# Patient Record
Sex: Female | Born: 1980
Health system: Southern US, Community
[De-identification: ages and names within clinical notes are randomized; demographics above are authoritative.]

## PROBLEM LIST (undated history)

## (undated) DIAGNOSIS — H5210 Myopia, unspecified eye: Secondary | ICD-10-CM

## (undated) DIAGNOSIS — M109 Gout, unspecified: Secondary | ICD-10-CM

## (undated) DIAGNOSIS — I499 Cardiac arrhythmia, unspecified: Secondary | ICD-10-CM

## (undated) DIAGNOSIS — J189 Pneumonia, unspecified organism: Secondary | ICD-10-CM

## (undated) DIAGNOSIS — G629 Polyneuropathy, unspecified: Secondary | ICD-10-CM

## (undated) DIAGNOSIS — M1A09X0 Idiopathic chronic gout, multiple sites, without tophus (tophi): Secondary | ICD-10-CM

## (undated) DIAGNOSIS — F32A Depression, unspecified: Secondary | ICD-10-CM

## (undated) DIAGNOSIS — D75838 Other thrombocytosis: Secondary | ICD-10-CM

## (undated) DIAGNOSIS — L732 Hidradenitis suppurativa: Secondary | ICD-10-CM

## (undated) DIAGNOSIS — R51 Headache: Secondary | ICD-10-CM

## (undated) DIAGNOSIS — Z872 Personal history of diseases of the skin and subcutaneous tissue: Secondary | ICD-10-CM

## (undated) DIAGNOSIS — L0591 Pilonidal cyst without abscess: Secondary | ICD-10-CM

## (undated) DIAGNOSIS — M1990 Unspecified osteoarthritis, unspecified site: Secondary | ICD-10-CM

## (undated) DIAGNOSIS — R7303 Prediabetes: Secondary | ICD-10-CM

## (undated) DIAGNOSIS — R519 Headache, unspecified: Secondary | ICD-10-CM

## (undated) DIAGNOSIS — M25569 Pain in unspecified knee: Secondary | ICD-10-CM

## (undated) DIAGNOSIS — G8929 Other chronic pain: Secondary | ICD-10-CM

## (undated) DIAGNOSIS — E538 Deficiency of other specified B group vitamins: Secondary | ICD-10-CM

## (undated) DIAGNOSIS — D649 Anemia, unspecified: Secondary | ICD-10-CM

## (undated) DIAGNOSIS — Z973 Presence of spectacles and contact lenses: Secondary | ICD-10-CM

## (undated) DIAGNOSIS — D509 Iron deficiency anemia, unspecified: Secondary | ICD-10-CM

## (undated) DIAGNOSIS — F329 Major depressive disorder, single episode, unspecified: Secondary | ICD-10-CM

## (undated) DIAGNOSIS — F419 Anxiety disorder, unspecified: Secondary | ICD-10-CM

## (undated) DIAGNOSIS — J45909 Unspecified asthma, uncomplicated: Secondary | ICD-10-CM

## (undated) DIAGNOSIS — L0293 Carbuncle, unspecified: Secondary | ICD-10-CM

## (undated) DIAGNOSIS — E669 Obesity, unspecified: Secondary | ICD-10-CM

## (undated) DIAGNOSIS — B009 Herpesviral infection, unspecified: Secondary | ICD-10-CM

## (undated) DIAGNOSIS — T7840XA Allergy, unspecified, initial encounter: Secondary | ICD-10-CM

## (undated) DIAGNOSIS — I456 Pre-excitation syndrome: Secondary | ICD-10-CM

## (undated) HISTORY — DX: Anemia, unspecified: D64.9

## (undated) HISTORY — DX: Myopia, unspecified eye: H52.10

## (undated) HISTORY — DX: Allergy, unspecified, initial encounter: T78.40XA

## (undated) HISTORY — DX: Other chronic pain: G89.29

## (undated) HISTORY — DX: Headache, unspecified: R51.9

## (undated) HISTORY — PX: TONSILLECTOMY: SUR1361

## (undated) HISTORY — DX: Headache: R51

## (undated) HISTORY — DX: Carbuncle, unspecified: L02.93

---
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
## 2000-12-27 ENCOUNTER — Emergency Department (HOSPITAL_COMMUNITY): Admission: EM | Admit: 2000-12-27 | Discharge: 2000-12-27 | Payer: Self-pay | Admitting: Emergency Medicine

## 2001-01-03 ENCOUNTER — Ambulatory Visit (HOSPITAL_COMMUNITY): Admission: RE | Admit: 2001-01-03 | Discharge: 2001-01-03 | Payer: Self-pay | Admitting: Family Medicine

## 2001-01-03 ENCOUNTER — Encounter: Payer: Self-pay | Admitting: Family Medicine

## 2002-10-11 ENCOUNTER — Emergency Department (HOSPITAL_COMMUNITY): Admission: EM | Admit: 2002-10-11 | Discharge: 2002-10-11 | Payer: Self-pay | Admitting: Emergency Medicine

## 2003-02-07 ENCOUNTER — Emergency Department (HOSPITAL_COMMUNITY): Admission: EM | Admit: 2003-02-07 | Discharge: 2003-02-08 | Payer: Self-pay | Admitting: Emergency Medicine

## 2003-05-16 ENCOUNTER — Inpatient Hospital Stay (HOSPITAL_COMMUNITY): Admission: AD | Admit: 2003-05-16 | Discharge: 2003-05-16 | Payer: Self-pay | Admitting: Obstetrics and Gynecology

## 2003-05-18 ENCOUNTER — Inpatient Hospital Stay (HOSPITAL_COMMUNITY): Admission: AD | Admit: 2003-05-18 | Discharge: 2003-05-18 | Payer: Self-pay | Admitting: *Deleted

## 2003-10-21 ENCOUNTER — Emergency Department (HOSPITAL_COMMUNITY): Admission: EM | Admit: 2003-10-21 | Discharge: 2003-10-22 | Payer: Self-pay | Admitting: Emergency Medicine

## 2004-01-09 ENCOUNTER — Emergency Department (HOSPITAL_COMMUNITY): Admission: EM | Admit: 2004-01-09 | Discharge: 2004-01-10 | Payer: Self-pay | Admitting: Emergency Medicine

## 2004-01-12 ENCOUNTER — Emergency Department (HOSPITAL_COMMUNITY): Admission: EM | Admit: 2004-01-12 | Discharge: 2004-01-12 | Payer: Self-pay | Admitting: Emergency Medicine

## 2004-02-21 ENCOUNTER — Emergency Department (HOSPITAL_COMMUNITY): Admission: EM | Admit: 2004-02-21 | Discharge: 2004-02-21 | Payer: Self-pay | Admitting: Family Medicine

## 2004-04-06 ENCOUNTER — Encounter (INDEPENDENT_AMBULATORY_CARE_PROVIDER_SITE_OTHER): Payer: Self-pay | Admitting: Internal Medicine

## 2004-04-06 LAB — CONVERTED CEMR LAB

## 2004-06-18 ENCOUNTER — Emergency Department (HOSPITAL_COMMUNITY): Admission: EM | Admit: 2004-06-18 | Discharge: 2004-06-18 | Payer: Self-pay | Admitting: Emergency Medicine

## 2004-12-24 ENCOUNTER — Emergency Department (HOSPITAL_COMMUNITY): Admission: EM | Admit: 2004-12-24 | Discharge: 2004-12-25 | Payer: Self-pay | Admitting: Emergency Medicine

## 2004-12-27 ENCOUNTER — Inpatient Hospital Stay (HOSPITAL_COMMUNITY): Admission: EM | Admit: 2004-12-27 | Discharge: 2004-12-28 | Payer: Self-pay | Admitting: Emergency Medicine

## 2004-12-27 HISTORY — PX: INCISION AND DRAINAGE PERITONSILLAR ABSCESS: SUR670

## 2005-05-01 ENCOUNTER — Encounter: Admission: RE | Admit: 2005-05-01 | Discharge: 2005-05-01 | Payer: Self-pay | Admitting: Internal Medicine

## 2005-08-27 ENCOUNTER — Emergency Department (HOSPITAL_COMMUNITY): Admission: EM | Admit: 2005-08-27 | Discharge: 2005-08-28 | Payer: Self-pay | Admitting: Emergency Medicine

## 2005-09-17 ENCOUNTER — Emergency Department (HOSPITAL_COMMUNITY): Admission: EM | Admit: 2005-09-17 | Discharge: 2005-09-17 | Payer: Self-pay | Admitting: Emergency Medicine

## 2005-09-17 ENCOUNTER — Emergency Department (HOSPITAL_COMMUNITY): Admission: EM | Admit: 2005-09-17 | Discharge: 2005-09-17 | Payer: Self-pay | Admitting: Family Medicine

## 2006-03-14 ENCOUNTER — Emergency Department (HOSPITAL_COMMUNITY): Admission: EM | Admit: 2006-03-14 | Discharge: 2006-03-14 | Payer: Self-pay | Admitting: Emergency Medicine

## 2006-04-29 ENCOUNTER — Ambulatory Visit: Payer: Self-pay | Admitting: Internal Medicine

## 2006-04-30 ENCOUNTER — Ambulatory Visit: Payer: Self-pay | Admitting: *Deleted

## 2006-06-24 ENCOUNTER — Ambulatory Visit: Payer: Self-pay | Admitting: Internal Medicine

## 2006-08-18 ENCOUNTER — Emergency Department (HOSPITAL_COMMUNITY): Admission: EM | Admit: 2006-08-18 | Discharge: 2006-08-18 | Payer: Self-pay | Admitting: Family Medicine

## 2006-10-11 ENCOUNTER — Ambulatory Visit: Payer: Self-pay | Admitting: Internal Medicine

## 2006-10-11 DIAGNOSIS — M25579 Pain in unspecified ankle and joints of unspecified foot: Secondary | ICD-10-CM

## 2006-10-13 ENCOUNTER — Ambulatory Visit (HOSPITAL_COMMUNITY): Admission: RE | Admit: 2006-10-13 | Discharge: 2006-10-13 | Payer: Self-pay | Admitting: Internal Medicine

## 2006-11-23 ENCOUNTER — Emergency Department (HOSPITAL_COMMUNITY): Admission: EM | Admit: 2006-11-23 | Discharge: 2006-11-23 | Payer: Self-pay | Admitting: Emergency Medicine

## 2006-12-13 ENCOUNTER — Telehealth (INDEPENDENT_AMBULATORY_CARE_PROVIDER_SITE_OTHER): Payer: Self-pay | Admitting: *Deleted

## 2006-12-13 ENCOUNTER — Telehealth (INDEPENDENT_AMBULATORY_CARE_PROVIDER_SITE_OTHER): Payer: Self-pay | Admitting: Internal Medicine

## 2006-12-14 ENCOUNTER — Emergency Department (HOSPITAL_COMMUNITY): Admission: EM | Admit: 2006-12-14 | Discharge: 2006-12-14 | Payer: Self-pay | Admitting: Emergency Medicine

## 2006-12-22 ENCOUNTER — Encounter (INDEPENDENT_AMBULATORY_CARE_PROVIDER_SITE_OTHER): Payer: Self-pay | Admitting: *Deleted

## 2006-12-30 ENCOUNTER — Encounter (INDEPENDENT_AMBULATORY_CARE_PROVIDER_SITE_OTHER): Payer: Self-pay | Admitting: Internal Medicine

## 2006-12-30 DIAGNOSIS — IMO0002 Reserved for concepts with insufficient information to code with codable children: Secondary | ICD-10-CM

## 2007-01-22 ENCOUNTER — Emergency Department (HOSPITAL_COMMUNITY): Admission: EM | Admit: 2007-01-22 | Discharge: 2007-01-22 | Payer: Self-pay | Admitting: *Deleted

## 2007-05-31 ENCOUNTER — Emergency Department (HOSPITAL_COMMUNITY): Admission: EM | Admit: 2007-05-31 | Discharge: 2007-05-31 | Payer: Self-pay | Admitting: Emergency Medicine

## 2007-06-07 ENCOUNTER — Emergency Department (HOSPITAL_COMMUNITY): Admission: EM | Admit: 2007-06-07 | Discharge: 2007-06-07 | Payer: Self-pay | Admitting: Emergency Medicine

## 2007-12-09 ENCOUNTER — Emergency Department (HOSPITAL_COMMUNITY): Admission: EM | Admit: 2007-12-09 | Discharge: 2007-12-09 | Payer: Self-pay | Admitting: *Deleted

## 2008-03-15 ENCOUNTER — Emergency Department (HOSPITAL_COMMUNITY): Admission: EM | Admit: 2008-03-15 | Discharge: 2008-03-15 | Payer: Self-pay | Admitting: Emergency Medicine

## 2008-03-17 ENCOUNTER — Emergency Department (HOSPITAL_COMMUNITY): Admission: EM | Admit: 2008-03-17 | Discharge: 2008-03-17 | Payer: Self-pay | Admitting: Emergency Medicine

## 2008-06-20 ENCOUNTER — Emergency Department (HOSPITAL_COMMUNITY): Admission: EM | Admit: 2008-06-20 | Discharge: 2008-06-20 | Payer: Self-pay | Admitting: Emergency Medicine

## 2008-09-28 ENCOUNTER — Emergency Department (HOSPITAL_COMMUNITY): Admission: EM | Admit: 2008-09-28 | Discharge: 2008-09-29 | Payer: Self-pay | Admitting: Emergency Medicine

## 2009-03-04 ENCOUNTER — Emergency Department (HOSPITAL_COMMUNITY): Admission: EM | Admit: 2009-03-04 | Discharge: 2009-03-04 | Payer: Self-pay | Admitting: Emergency Medicine

## 2009-03-11 ENCOUNTER — Inpatient Hospital Stay (HOSPITAL_COMMUNITY): Admission: AD | Admit: 2009-03-11 | Discharge: 2009-03-11 | Payer: Self-pay | Admitting: Obstetrics & Gynecology

## 2009-03-14 ENCOUNTER — Emergency Department (HOSPITAL_COMMUNITY): Admission: EM | Admit: 2009-03-14 | Discharge: 2009-03-15 | Payer: Self-pay | Admitting: Emergency Medicine

## 2009-05-14 ENCOUNTER — Emergency Department (HOSPITAL_COMMUNITY): Admission: EM | Admit: 2009-05-14 | Discharge: 2009-05-14 | Payer: Self-pay | Admitting: Emergency Medicine

## 2009-09-08 ENCOUNTER — Inpatient Hospital Stay (HOSPITAL_COMMUNITY): Admission: AD | Admit: 2009-09-08 | Discharge: 2009-09-09 | Payer: Self-pay | Admitting: Obstetrics & Gynecology

## 2010-01-23 ENCOUNTER — Emergency Department (HOSPITAL_COMMUNITY): Admission: EM | Admit: 2010-01-23 | Discharge: 2010-01-23 | Payer: Self-pay | Admitting: Family Medicine

## 2010-03-30 ENCOUNTER — Emergency Department (HOSPITAL_COMMUNITY)
Admission: EM | Admit: 2010-03-30 | Discharge: 2010-03-30 | Payer: Self-pay | Source: Home / Self Care | Admitting: Emergency Medicine

## 2010-06-23 LAB — URINE CULTURE: Colony Count: >=100000

## 2010-06-23 LAB — WOUND CULTURE

## 2010-06-23 LAB — URINALYSIS, ROUTINE W REFLEX MICROSCOPIC
Bilirubin Urine: NEGATIVE
Glucose, UA: NEGATIVE mg/dL
Ketones, ur: NEGATIVE mg/dL
Nitrite: POSITIVE — AB
Protein, ur: 30 mg/dL — AB
Specific Gravity, Urine: 1.025 (ref 1.005–1.030)
Urobilinogen, UA: 0.2 mg/dL (ref 0.0–1.0)
pH: 5 (ref 5.0–8.0)

## 2010-06-23 LAB — URINE MICROSCOPIC-ADD ON

## 2010-06-23 LAB — POCT PREGNANCY, URINE: Preg Test, Ur: NEGATIVE

## 2010-06-25 LAB — POCT I-STAT, CHEM 8
BUN: <3 mg/dL — ABNORMAL LOW (ref 6–23)
Calcium, Ion: 1.02 mmol/L — ABNORMAL LOW (ref 1.12–1.32)
Chloride: 106 mEq/L (ref 96–112)
Creatinine, Ser: 0.9 mg/dL (ref 0.4–1.2)
Glucose, Bld: 84 mg/dL (ref 70–99)
HCT: 34 % — ABNORMAL LOW (ref 36.0–46.0)
Hemoglobin: 11.6 g/dL — ABNORMAL LOW (ref 12.0–15.0)
Potassium: 3.4 mEq/L — ABNORMAL LOW (ref 3.5–5.1)
Sodium: 138 mEq/L (ref 135–145)
TCO2: 23 mmol/L (ref 0–100)

## 2010-07-08 LAB — CBC
HCT: 29.8 % — ABNORMAL LOW (ref 36.0–46.0)
Hemoglobin: 9 g/dL — ABNORMAL LOW (ref 12.0–15.0)
MCHC: 30.4 g/dL (ref 30.0–36.0)
MCV: 68.8 fL — ABNORMAL LOW (ref 78.0–100.0)
Platelets: 550 10*3/uL — ABNORMAL HIGH (ref 150–400)
RBC: 4.33 MIL/uL (ref 3.87–5.11)
RDW: 20 % — ABNORMAL HIGH (ref 11.5–15.5)
WBC: 10.2 10*3/uL (ref 4.0–10.5)

## 2010-07-08 LAB — URINALYSIS, ROUTINE W REFLEX MICROSCOPIC
Bilirubin Urine: NEGATIVE
Glucose, UA: NEGATIVE mg/dL
Ketones, ur: 15 mg/dL — AB
Nitrite: NEGATIVE
Protein, ur: 30 mg/dL — AB
Specific Gravity, Urine: >1.03 — ABNORMAL HIGH (ref 1.005–1.030)
Urobilinogen, UA: 0.2 mg/dL (ref 0.0–1.0)
pH: 5 (ref 5.0–8.0)

## 2010-07-08 LAB — WET PREP, GENITAL
Clue Cells Wet Prep HPF POC: NONE SEEN
Trich, Wet Prep: NONE SEEN
Yeast Wet Prep HPF POC: NONE SEEN

## 2010-07-08 LAB — URINE MICROSCOPIC-ADD ON

## 2010-07-08 LAB — POCT PREGNANCY, URINE: Preg Test, Ur: NEGATIVE

## 2010-07-09 LAB — DIFFERENTIAL
Basophils Relative: 0 % (ref 0–1)
Eosinophils Absolute: 0.1 10*3/uL (ref 0.0–0.7)
Eosinophils Relative: 1 % (ref 0–5)
Lymphs Abs: 1.5 10*3/uL (ref 0.7–4.0)
Monocytes Absolute: 0.6 10*3/uL (ref 0.1–1.0)
Monocytes Relative: 6 % (ref 3–12)

## 2010-07-09 LAB — PREGNANCY, URINE: Preg Test, Ur: NEGATIVE

## 2010-07-09 LAB — URINALYSIS, ROUTINE W REFLEX MICROSCOPIC
Bilirubin Urine: NEGATIVE
Nitrite: NEGATIVE
Protein, ur: NEGATIVE mg/dL
Specific Gravity, Urine: 1.026 (ref 1.005–1.030)
Urobilinogen, UA: 0.2 mg/dL (ref 0.0–1.0)

## 2010-07-09 LAB — URINE MICROSCOPIC-ADD ON

## 2010-07-09 LAB — CBC
HCT: 30.1 % — ABNORMAL LOW (ref 36.0–46.0)
Hemoglobin: 9.2 g/dL — ABNORMAL LOW (ref 12.0–15.0)
MCHC: 30.6 g/dL (ref 30.0–36.0)
MCV: 68.3 fL — ABNORMAL LOW (ref 78.0–100.0)
RBC: 4.41 MIL/uL (ref 3.87–5.11)

## 2010-07-09 LAB — WET PREP, GENITAL

## 2010-07-14 LAB — URINE MICROSCOPIC-ADD ON

## 2010-07-14 LAB — URINALYSIS, ROUTINE W REFLEX MICROSCOPIC
Bilirubin Urine: NEGATIVE
Glucose, UA: NEGATIVE mg/dL
Ketones, ur: NEGATIVE mg/dL
Leukocytes, UA: NEGATIVE
Nitrite: NEGATIVE
Protein, ur: NEGATIVE mg/dL
Specific Gravity, Urine: 1.027 (ref 1.005–1.030)
Urobilinogen, UA: 0.2 mg/dL (ref 0.0–1.0)
pH: 5.5 (ref 5.0–8.0)
pH: 5.5 (ref 5.0–8.0)

## 2010-07-14 LAB — GC/CHLAMYDIA PROBE AMP, GENITAL
Chlamydia, DNA Probe: NEGATIVE
GC Probe Amp, Genital: NEGATIVE

## 2010-07-14 LAB — CBC
HCT: 29.5 % — ABNORMAL LOW (ref 36.0–46.0)
Hemoglobin: 9.2 g/dL — ABNORMAL LOW (ref 12.0–15.0)
MCV: 69.4 fL — ABNORMAL LOW (ref 78.0–100.0)
Platelets: 473 10*3/uL — ABNORMAL HIGH (ref 150–400)
RDW: 19 % — ABNORMAL HIGH (ref 11.5–15.5)
WBC: 9.2 10*3/uL (ref 4.0–10.5)

## 2010-07-14 LAB — DIFFERENTIAL
Basophils Absolute: 0.1 10*3/uL (ref 0.0–0.1)
Basophils Relative: 1 % (ref 0–1)
Lymphocytes Relative: 32 % (ref 12–46)
Lymphs Abs: 2.9 10*3/uL (ref 0.7–4.0)
Monocytes Relative: 5 % (ref 3–12)
Neutro Abs: 5.6 10*3/uL (ref 1.7–7.7)

## 2010-07-14 LAB — WET PREP, GENITAL: Trich, Wet Prep: NONE SEEN

## 2010-07-14 LAB — POCT I-STAT, CHEM 8
BUN: 9 mg/dL (ref 6–23)
Calcium, Ion: 1.08 mmol/L — ABNORMAL LOW (ref 1.12–1.32)
Chloride: 109 mEq/L (ref 96–112)
Glucose, Bld: 81 mg/dL (ref 70–99)
TCO2: 21 mmol/L (ref 0–100)

## 2010-08-22 NOTE — H&P (Signed)
Amanda Davenport, Amanda Davenport               ACCOUNT NO.:  [id number]   MEDICAL RECORD NO.:  [id number]          PATIENT TYPE:  INP   LOCATION:  0101                         FACILITY:  [facility]   PHYSICIAN:  Mallory Shirk, MD     DATE OF BIRTH:  November 13, 1980   DATE OF ADMISSION:  12/27/2004  DATE OF DISCHARGE:                                HISTORY & PHYSICAL   CHIEF COMPLAINT:  Her throat hurts.   HISTORY OF PRESENT ILLNESS:  Th[age]-year-old African-American womanAmerican woman  with complaints of sore throat was seen in the ED 3 days ago with same  complaint, was sent home with Vicodin.  Now comes in with severe sore throat  and inability to tolerate p.o. fluids or solids and also has a fever.  Patient has difficulty talking, has no difficulty breathing at the present  time.  Patient has some nausea but no vomiting, no chest pain, some mild  shortness of breath, no other complaints.   PAST MEDICAL HISTORY:  None.   MEDICATIONS:  None.   FAMILY HISTORY:  Family history significant for mother dying at age 78 of  breast cancer.   SOCIAL HISTORY:  Patient is single, works at [employer], occasional  alcohol use, no illicit drug use.   REVIEW OF SYSTEMS:  Other than HPI negative.  Greater than 10 systems  reviewed.   PHYSICAL EXAMINATION:  VITAL SIGNS:  On admission temperature 101.2, pulse  116, respirations 19, blood pressure 128/75.  GENERAL:  Young African-American woman mildly obese lying in bed in mild  distress.  HEENT:  Normocephalic, atraumatic.  Oropharynx erythematous.  Exudates noted  on the tonsils.  Cervical lymphadenopathy appreciated.  Neck supple.  Tympanic membranes clear.  LUNGS:  Clear to auscultation bilaterally.  No wheezes, no rales.  CARDIOVASCULAR SYSTEM:  S1 plus S2 tachycardic.  No murmurs, rubs, or  gallops.  ABDOMEN:  Soft, positive bowel sounds.  No tenderness, no masses.  EXTREMITIES:  No cyanosis, clubbing, or edema.  NEUROLOGIC EXAM:  Nonfocal.   LABORATORIES:  Neck x-ray aryepiglottic folds unremarkable, no prevertebral  soft tissue swelling,  no intrapharyngeal __________, no foreign body noted.   WBC is 20.3, hemoglobin 10.8, hematocrit 33.7, platelets 465.  Sodium 134,  potassium 4.8, chloride 102, carbon dioxide 24, glucose 94, BUN 9,  creatinine 0.8, calcium 9.1, group A strep test negative.   ASSESSMENT AND PLAN:  A 30 year old African-American woman with tonsillitis.   Problem 1. TONSILLITIS.  Patient unable to tolerate p.o. liquids or solids  at the present time, no difficulty with airway, however, an emergent consult  from ENT has been called from the emergency room, Dr Lazarus Salines will see pt.  We will admit patient to the step-down unit until this evaluation is  complete.  She has already been given ceftriaxone in the ED as well as 10 mg  of dexamethasone IM.  We will continue the ceftriaxone at 1 g IV daily.  She  has also been given IV fluids, which we will continue.   Problem 2. PROPHYLAXIS.  Patient will be given SCDs for DVT prophylaxis and  Protonix for GI prophylaxis.   Problem 3. DISPOSITION.  After resolution of acute symptoms patient will be  discharged home.      Mallory Shirk, MD  Electronically Signed     GDK/MEDQ  D:  12/27/2004  T:  12/27/2004  Job:  [id number]

## 2010-08-22 NOTE — Op Note (Signed)
[name], [name]               ACCOUNT NO.:  [id number]   MEDICAL RECORD NO.:  [id number]          PATIENT TYPE:  INP   LOCATION:  0153                         FACILITY:  [facility]   PHYSICIAN:  Karol T. Lazarus Salines, M.D. DATE OF BIRTH:  Mar 10, 1981   DATE OF PROCEDURE:  12/27/2004  DATE OF DISCHARGE:  12/28/2004                                 OPERATIVE REPORT   PREOPERATIVE DIAGNOSIS:  Left peritonsillar abscess.   POSTOPERATIVE DIAGNOSIS:  Left peritonsillar abscess.   OPERATION/PROCEDURE:  Incision and drainage, left peritonsillar abscess.   ANESTHESIA:  General orotracheal.   SURGEON:  Karol T. Lazarus Salines, M.D.   ESTIMATED BLOOD LOSS:  Minimal.   COMPLICATIONS:  None.   FINDINGS:  Bilateral bulky, swollen and exudative tonsils.  Bulging of the  left hemisoft palate with protrusion of the tonsil, displacement of the  uvula and a moderately small, frank, pus-containing anterior superior  peritonsillar abscess.   DESCRIPTION OF PROCEDURE:  With the patient in the comfortable supine  position, general orotracheal anesthesia was induced without difficulty.  At  an appropriate level, the table was turned 90 degrees and the patient placed  in Trendelenburg.  A clean preparation and draping was accomplished, taking  care to protect lips, teeth, and endotracheal tube.  The Crowe-Davis mouth  gag was introduced, extended for visualization, and suspended from the Mayo  stand in the standard fashion.  The findings were as described above.  The  cutting and coagulating cautery was used to generate a 2 cm crescent  incision above the superior pole of the left tonsil.  This was carried down  and directly a pus-containing abscess cavity was encountered.  The pus was  carefully evaluated, taking care to avoid that she did not aspirate any  such.  The mouth of the cavity was widely opened.  The tonsil capsule was  probed more deeply with a blunt tonsil hemostat to make sure there was no  additional deeper loculations which were not noted.  The oral cavity was  thoroughly irrigated.  Hemostasis was spontaneous.   The mouth gag was relaxed for several minutes after first irrigating and  suctioning the pharynx.  Upon reexpansion, hemostasis was persistent.  At  this point the procedure was completed.  The mouth gag was relaxed and  removed.  The dental status was intact.  The patient was returned to  anesthesia, awakened, extubated, and transferred to recovery in stable  condition.   COMMENT:  A 30 year old black female with a one-week history of sore throat,  progressively severe over the past three to four days with a white blood  cell count of 20,000 and a CT scan demonstrating abscess pocket with several  indications for today's procedures.  Anticipate a routine postoperative  recovery potential, analgesia, antibiosis, ice, elevation, hydration. Given  dehydration and also the probability that she has obstructive sleep apnea,  will observe her 23 hours observation in the step-down unit.      Gloris Manchester. Lazarus Salines, M.D.  Electronically Signed     KTW/MEDQ  D:  12/27/2004  T:  12/28/2004  Job:  [id number]

## 2010-08-22 NOTE — Consult Note (Signed)
Amanda Davenport, Amanda Davenport               ACCOUNT NO.:  [id number]   MEDICAL RECORD NO.:  [id number]          PATIENT TYPE:  INP   LOCATION:  0153                         FACILITY:  [facility]   PHYSICIAN:  Karol T. Lazarus Salines, M.D. DATE OF BIRTH:  [birth year]   DATE OF CONSULTATION:  DATE OF DISCHARGE:                                   CONSULTATION   CHIEF COMPLAINT:  Severe sore throat.   HISTORY:  A 30 year old black female came down with a mild scratchy sore  throat approximately 1 week ago. Three or 4 days into the course, things  became bad enough that she was having pain and difficulty with swallowing.  She was seen in the emergency room and told that it was viral and no  antibiotics were prescribed. From that point forward she has been unable to  drink by mouth. She comes in now, 3 days later, with progressively severe  sore throat, low-grade fever, and pain localizing to the left throat, left  ear and left neck. Slight trismus. No actual breathing difficulty although  her throat does feel swollen. She was evaluated in the emergency room and  thought to have bilateral tonsillar swelling consistent with tonsillitis and  preparations were made for admission. She received intravenous hydration and  Rocephin. White count was 20,000. Temperature 101.2. The lateral soft tissue  film of the neck did not show any significant abnormalities of the  supraglottic larynx or retropharyngeal space. A CT scan of the neck showed a  circumscribed lucency lateral to the left tonsil consistent with a  peritonsillar abscess. The patient has not had anything to eat or drink for  3 days now. She is not diabetic or otherwise immune compromised. She is not  currently pregnant.   PAST MEDICAL HISTORY:  No known allergies. No current medications. No prior  surgery. No active medical condition.   SOCIAL HISTORY:  She works in [employer] at [employer]. She does not  smoke and has an occasional drink.   FAMILY  HISTORY:  Noncontributory.   REVIEW OF SYSTEMS:  She did have strep throat fairly frequently throughout  her youth.   PHYSICAL EXAMINATION:  GENERAL:  This is an obese, mildly warm, mildly  distressed adult black female. She has a significant hot potato  voice but  no stridor or breathing difficulty. Mental status is appropriate. She hears  well in conversational speech.  HEENT:  Respirations unlabored through the nose. The head is atraumatic and  neck supple. Cranial nerves II-XII grossly intact. Ear canals are clear with  normal aerated drums. Anterior nose non congested. Oral cavity shows some  partially dry secretions. No trismus. Oropharynx reveals bilateral exudative  tonsillitis with protrusion of the left tonsil, deviation of the uvula  across the midline towards the right, and tender bulging of the left soft  palate. There is some adherent exudate on the posterior pharyngeal wall. I  did not examine nasopharynx or hypopharynx.  NECK:  Obese without discreet adenopathy but she is tender in the left  jugulodigastric region.   IMPRESSION:  1.Left peritonsillar abscess.  1.  Dehydration.  2.  Bilateral tonsillitis.   PLAN:  She received Rocephin but I would rather have her get something with  some anaerobic coverage and will order Penicillin G. I think this should be  drained today. I offered to do this at the bedside under local anesthesia,  or in the operating room under general anesthesia. She elects the latter.  She has been prepared for hospital admission by the Hospitalists. I think  from the stand-point of hydration this may still be reasonable. Will cover  her with IV ampicillin afterwards and then switch to oral analgesics,  hydration and advance her diet. I would anticipate that she could go home  tomorrow at the latest.      Zola Button T. Lazarus Salines, M.D.  Electronically Signed     KTW/MEDQ  D:  12/27/2004  T:  12/28/2004  Job:  [id number]

## 2010-10-15 ENCOUNTER — Inpatient Hospital Stay (HOSPITAL_COMMUNITY)
Admission: AD | Admit: 2010-10-15 | Discharge: 2010-10-16 | Disposition: A | Discharge status: Home Health | Payer: Self-pay | Source: Ambulatory Visit | Attending: Obstetrics & Gynecology | Admitting: Obstetrics & Gynecology

## 2010-10-15 DIAGNOSIS — R109 Unspecified abdominal pain: Secondary | ICD-10-CM | POA: Insufficient documentation

## 2010-10-15 LAB — URINE MICROSCOPIC-ADD ON

## 2010-10-15 LAB — URINALYSIS, ROUTINE W REFLEX MICROSCOPIC
Glucose, UA: NEGATIVE mg/dL
Protein, ur: NEGATIVE mg/dL
Specific Gravity, Urine: >1.03 — ABNORMAL HIGH (ref 1.005–1.030)

## 2010-10-15 NOTE — Progress Notes (Addendum)
Hallway intake, pt very talkative with history, Rx tech here for med history. Pt was here as IP  approx 2 wks ago, was treated with pain med Has right side pain due to "twisted ovary"  appt at [facility] in Aug

## 2010-10-15 NOTE — Progress Notes (Signed)
Side pain x 2 week, hurt walk and bend over

## 2010-10-16 ENCOUNTER — Encounter (HOSPITAL_COMMUNITY): Payer: Self-pay | Admitting: *Deleted

## 2010-10-16 NOTE — ED Notes (Signed)
Pt wishes to sign out ama at this point.  wiill print out ama form.

## 2010-10-16 NOTE — ED Notes (Signed)
Pt asking how much longer wait will be.  MAU provider aware of pt waiting.  Delay explained to pt.

## 2010-10-16 NOTE — Progress Notes (Signed)
Pt given ama instructions.  Verbalized understanding.  Ambulated to self care in stable condition.

## 2010-10-16 NOTE — Initial Assessments (Addendum)
Pt presents to [facility] for pain on right side.  State she cannot lay flat.  Has trouble turning.  Was at work today and was unable to bend over.  States she has had this pain for 2 weeks.

## 2011-01-09 LAB — URINE MICROSCOPIC-ADD ON

## 2011-01-09 LAB — URINALYSIS, ROUTINE W REFLEX MICROSCOPIC
Bilirubin Urine: NEGATIVE
Glucose, UA: NEGATIVE mg/dL
Ketones, ur: NEGATIVE mg/dL
Ketones, ur: NEGATIVE mg/dL
Nitrite: NEGATIVE
Nitrite: NEGATIVE
Protein, ur: NEGATIVE mg/dL
Specific Gravity, Urine: 1.016 (ref 1.005–1.030)
pH: 6 (ref 5.0–8.0)
pH: 6 (ref 5.0–8.0)

## 2011-01-09 LAB — WET PREP, GENITAL: Trich, Wet Prep: NONE SEEN

## 2011-01-09 LAB — URINE CULTURE: Colony Count: 15000

## 2011-01-09 LAB — GC/CHLAMYDIA PROBE AMP, GENITAL: GC Probe Amp, Genital: NEGATIVE

## 2011-01-09 LAB — PREGNANCY, URINE: Preg Test, Ur: NEGATIVE

## 2011-01-16 LAB — URINE MICROSCOPIC-ADD ON

## 2011-01-16 LAB — URINALYSIS, ROUTINE W REFLEX MICROSCOPIC
Glucose, UA: NEGATIVE
Protein, ur: 30 — AB
Specific Gravity, Urine: 1.017
Urobilinogen, UA: 1

## 2011-04-22 ENCOUNTER — Encounter (HOSPITAL_COMMUNITY): Payer: Self-pay

## 2011-04-22 ENCOUNTER — Emergency Department (HOSPITAL_COMMUNITY)
Admission: EM | Admit: 2011-04-22 | Discharge: 2011-04-22 | Disposition: A | Discharge status: Home / Self Care | Payer: Self-pay | Attending: Emergency Medicine | Admitting: Emergency Medicine

## 2011-04-22 DIAGNOSIS — M79609 Pain in unspecified limb: Secondary | ICD-10-CM | POA: Insufficient documentation

## 2011-04-22 DIAGNOSIS — M79646 Pain in unspecified finger(s): Secondary | ICD-10-CM

## 2011-04-22 DIAGNOSIS — F17200 Nicotine dependence, unspecified, uncomplicated: Secondary | ICD-10-CM | POA: Insufficient documentation

## 2011-04-22 MED ORDER — IBUPROFEN 600 MG PO TABS
600.0000 mg | ORAL_TABLET | Freq: Four times a day (QID) | ORAL | Status: AC | PRN
Start: 2011-04-22 — End: 2011-05-02

## 2011-04-22 NOTE — ED Provider Notes (Signed)
Medical screening examination/treatment/procedure(s) were performed by non-physician practitioner and as supervising physician I was immediately available for consultation/collaboration.  Sullivan Jacuinde R. Madyx Delfin, MD 04/22/11 2347 

## 2011-04-22 NOTE — Discharge Instructions (Signed)
You've been placed in a brace for support wear this while awake.  Also been given a prescription for ibuprofen to take as directed.  If her thumb is not getting better.  Please make an appointment with Dr. call in 3-4 days for followup evaluation

## 2011-04-22 NOTE — ED Provider Notes (Signed)
History     CSN: [id number]  Arrival date & time 04/22/11  Amanda Davenport   First MD Initiated Contact with Patient 04/22/11 2022      Chief Complaint  Patient presents with  . Hand Pain    (Consider location/radiation/quality/duration/timing/severity/associated sxs/prior treatment) HPI Comments: Patient with right thumb pain in the thenar area.  Denies any injury.  Is having pain when she bends at the distal interphalangeal joint.  No discoloration.  No paronychia.  Good cap refill normal coloration warm to touch  Patient is a 31 y.o. female presenting with hand pain. The history is provided by the patient.  Hand Pain This is a new problem. The current episode started in the past 7 days. The problem occurs constantly. The problem has been unchanged. Pertinent negatives include no joint swelling.    Past Medical History  Diagnosis Date  . No pertinent past medical history     Past Surgical History  Procedure Date  . Cystectomy     Family History  Problem Relation Age of Onset  . Cancer Mother     History  Substance Use Topics  . Smoking status: Current Some Day Smoker -- .5 years    Types: Cigarettes  . Smokeless tobacco: Not on file  . Alcohol Use: No    OB History    Grav Para Term Preterm Abortions TAB SAB Ect Mult Living   0               Review of Systems  Constitutional: Negative.   Musculoskeletal: Negative for joint swelling.    Allergies  Penicillins  Home Medications   Current Outpatient Rx  Name Route Sig Dispense Refill  . ACETAMINOPHEN 325 MG PO TABS Oral Take 650 mg by mouth daily as needed. Pain      . FERROUS SULFATE 325 (65 FE) MG PO TABS Oral Take 325 mg by mouth daily with breakfast.    . IBUPROFEN 200 MG PO TABS Oral Take 800 mg by mouth every 6 (six) hours as needed. pain     . TURMERIC PO Oral Take 1 tablet by mouth once.    . IBUPROFEN 600 MG PO TABS Oral Take 1 tablet (600 mg total) by mouth every 6 (six) hours as needed for pain. 30  tablet 0    BP 112/65  Pulse 96  Temp(Src) 98.9 F (37.2 C) (Oral)  Resp 18  Ht 5\' 9"  (1.753 m)  Wt 263 lb (119.296 kg)  BMI 38.84 kg/m2  SpO2 97%  LMP 04/11/2011  Physical Exam  Constitutional: She appears well-developed and well-nourished.  HENT:  Head: Normocephalic.  Eyes: Pupils are equal, round, and reactive to light.  Cardiovascular: Normal rate.   Pulmonary/Chest: Effort normal.  Musculoskeletal: Normal range of motion. She exhibits tenderness. She exhibits no edema.       Tender in the thenar area without swelling, discoloration, pain with movement of the distal interphalangeal joint without swelling.  Click, deformity  Skin: Skin is warm and dry.  Psychiatric: She has a normal mood and affect.    ED Course  Procedures (including critical care time)  Labs Reviewed - No data to display No results found.   1. Thumb pain       MDM  Finger pain        Arman Filter, NP 04/22/11 2110  Arman Filter, NP 04/22/11 2110

## 2011-04-22 NOTE — ED Notes (Signed)
Pt presents with 1 week h/o R thumb pain.  Pt denies any injury, reports swelling, redness and warm to touch.

## 2011-04-22 NOTE — Progress Notes (Signed)
Orthopedic Tech Progress Note Patient Details:  Amanda Davenport 1981/02/11 [id number]  Type of Splint: Thumb spica;Thumb velcro Splint Location: right hand Splint Interventions: Application    [name] 04/22/2011, 8:52 PM

## 2011-07-08 ENCOUNTER — Emergency Department (HOSPITAL_COMMUNITY)
Admission: EM | Admit: 2011-07-08 | Discharge: 2011-07-08 | Disposition: A | Discharge status: Home / Self Care | Payer: PRIVATE HEALTH INSURANCE | Attending: Emergency Medicine | Admitting: Emergency Medicine

## 2011-07-08 ENCOUNTER — Encounter (HOSPITAL_COMMUNITY): Payer: Self-pay

## 2011-07-08 ENCOUNTER — Emergency Department (HOSPITAL_COMMUNITY): Payer: PRIVATE HEALTH INSURANCE

## 2011-07-08 DIAGNOSIS — R609 Edema, unspecified: Secondary | ICD-10-CM | POA: Insufficient documentation

## 2011-07-08 DIAGNOSIS — M25579 Pain in unspecified ankle and joints of unspecified foot: Secondary | ICD-10-CM | POA: Insufficient documentation

## 2011-07-08 DIAGNOSIS — M25572 Pain in left ankle and joints of left foot: Secondary | ICD-10-CM

## 2011-07-08 MED ORDER — HYDROCODONE-ACETAMINOPHEN 5-500 MG PO TABS: 1.0000 | ORAL_TABLET | Freq: Four times a day (QID) | ORAL | Status: AC | PRN

## 2011-07-08 NOTE — Discharge Instructions (Signed)
Take ibuprofen, up to 800mg  three times a day, as needed for pain.  Ice 3 times a day for 15-20 minutes.  Elevate when possible.  Activity as tolerated.  You may return to the ER if your pain worsens or you have any other concerns.

## 2011-07-08 NOTE — ED Notes (Signed)
Patient transported to X-ray 

## 2011-07-08 NOTE — Progress Notes (Signed)
Orthopedic Tech Progress Note Patient Details:  Amanda Davenport 1980/11/10 [id number]  Other Ortho Devices Type of Ortho Device: ASO Ortho Device Location: (L) LE Ortho Device Interventions: Application   Jennye Moccasin 07/08/2011, 7:41 PM

## 2011-07-08 NOTE — ED Notes (Signed)
Patient presents with swelling and pain to left ankle x 1 week with worsening swelling and pain today. Patient works 2 jobs and is on her feet constantly. Patient also denies recent injury. Moderate edema noted to ankle.

## 2011-07-09 NOTE — ED Provider Notes (Signed)
History     CSN: [id number]  Arrival date & time 07/08/11  1647   First MD Initiated Contact with Patient 07/08/11 1759      Chief Complaint  Patient presents with  . Joint Swelling    (Consider location/radiation/quality/duration/timing/severity/associated sxs/prior treatment) HPI History provided by pt.   Pt presents w/ non-traumatic pain and edema of left medial ankle w/ radiation into lower leg.  Unable to bear weight.  Has take ibuprofen w/out relief. No associated skin changes or fever.  Has never had pain like this before and denies other arthralgias.    Past Medical History  Diagnosis Date  . No pertinent past medical history     Past Surgical History  Procedure Date  . Cystectomy     Family History  Problem Relation Age of Onset  . Cancer Mother     History  Substance Use Topics  . Smoking status: Current Some Day Smoker -- .5 years    Types: Cigarettes  . Smokeless tobacco: Not on file  . Alcohol Use: No    OB History    Grav Para Term Preterm Abortions TAB SAB Ect Mult Living   0               Review of Systems  All other systems reviewed and are negative.    Allergies  Penicillins  Home Medications   Current Outpatient Rx  Name Route Sig Dispense Refill  . ACETAMINOPHEN 325 MG PO TABS Oral Take 650 mg by mouth daily as needed. Pain      . FERROUS SULFATE 325 (65 FE) MG PO TABS Oral Take 325 mg by mouth daily with breakfast.    . IBUPROFEN 200 MG PO TABS Oral Take 800 mg by mouth every 6 (six) hours as needed. pain     . TURMERIC PO Oral Take 1 tablet by mouth once.    Marland Kitchen HYDROCODONE-ACETAMINOPHEN 5-500 MG PO TABS Oral Take 1-2 tablets by mouth every 6 (six) hours as needed for pain. 12 tablet 0    BP 101/61  Pulse 83  Temp(Src) 98.3 F (36.8 C) (Oral)  Resp 18  SpO2 100%  LMP 06/07/2011  Physical Exam  Nursing note and vitals reviewed. Constitutional: She is oriented to person, place, and time. She appears well-developed and  well-nourished. No distress.  HENT:  Head: Normocephalic and atraumatic.  Eyes:       Normal appearance  Neck: Normal range of motion.  Musculoskeletal:       Edema at left medial malleolus.  No erythema or warmth.  Diffuse tenderness of medial ankle. No edema or tenderness of calf and achilles tendon intact.   Moderate pain w/ flexion, lateral rotation/invertion of foot.  Mild pain w/ plantar flexion.  2+ DP pulse and distal sensation intact.    Neurological: She is alert and oriented to person, place, and time.  Psychiatric: She has a normal mood and affect. Her behavior is normal.    ED Course  Procedures (including critical care time)  Labs Reviewed - No data to display Dg Ankle Complete Left  07/08/2011  *RADIOLOGY REPORT*  Clinical Data: Joint swelling no known injury  LEFT ANKLE COMPLETE - 3+ VIEW  Comparison: None.  Findings: Three views of the left ankle submitted.  No acute fracture or subluxation.  Plantar spur of the calcaneus is noted. Ankle mortise is preserved.  Mild soft tissue swelling adjacent to medial malleolus.  IMPRESSION: No acute fracture or subluxation.  Mild soft tissue  swelling medially.  Plantar spur of the calcaneus.  Original Report Authenticated By: Natasha Mead, M.D.     1. Pain in joint of left ankle or foot       MDM  Pt presents w/ non-traumatic left ankle pain/edema.  No signs of infection or gout on exam.  Most likely has a sprain.  Ortho tech placed in ASO.  Pt would like to buy a cane to assist in ambulation.  Recommended RICE.  Referred to ortho for persistent sx.  Advised her to return if she develops fever or skin changes.         Otilio Miu, [place] 07/09/11 [phone]

## 2011-07-09 NOTE — ED Provider Notes (Signed)
Medical screening examination/treatment/procedure(s) were performed by non-physician practitioner and as supervising physician I was immediately available for consultation/collaboration.   Forbes Cellar, MD 07/09/11 [phone]

## 2011-08-05 DIAGNOSIS — I456 Pre-excitation syndrome: Secondary | ICD-10-CM

## 2011-08-05 HISTORY — DX: Pre-excitation syndrome: I45.6

## 2011-08-08 ENCOUNTER — Encounter (HOSPITAL_COMMUNITY): Payer: Self-pay | Admitting: Emergency Medicine

## 2011-08-08 ENCOUNTER — Emergency Department (HOSPITAL_COMMUNITY)
Admission: EM | Admit: 2011-08-08 | Discharge: 2011-08-09 | Disposition: A | Discharge status: Home / Self Care | Payer: No Typology Code available for payment source | Attending: Emergency Medicine | Admitting: Emergency Medicine

## 2011-08-08 ENCOUNTER — Emergency Department (HOSPITAL_COMMUNITY): Payer: No Typology Code available for payment source

## 2011-08-08 DIAGNOSIS — D649 Anemia, unspecified: Secondary | ICD-10-CM | POA: Insufficient documentation

## 2011-08-08 DIAGNOSIS — M25519 Pain in unspecified shoulder: Secondary | ICD-10-CM | POA: Insufficient documentation

## 2011-08-08 DIAGNOSIS — M546 Pain in thoracic spine: Secondary | ICD-10-CM | POA: Insufficient documentation

## 2011-08-08 DIAGNOSIS — R110 Nausea: Secondary | ICD-10-CM | POA: Insufficient documentation

## 2011-08-08 DIAGNOSIS — R071 Chest pain on breathing: Secondary | ICD-10-CM | POA: Insufficient documentation

## 2011-08-08 DIAGNOSIS — R0789 Other chest pain: Secondary | ICD-10-CM

## 2011-08-08 LAB — CBC
MCH: 19.1 pg — ABNORMAL LOW (ref 26.0–34.0)
MCV: 64.1 fL — ABNORMAL LOW (ref 78.0–100.0)
Platelets: 572 10*3/uL — ABNORMAL HIGH (ref 150–400)
RDW: 18.5 % — ABNORMAL HIGH (ref 11.5–15.5)
WBC: 10.8 10*3/uL — ABNORMAL HIGH (ref 4.0–10.5)

## 2011-08-08 LAB — BASIC METABOLIC PANEL
Calcium: 8.8 mg/dL (ref 8.4–10.5)
Creatinine, Ser: 0.72 mg/dL (ref 0.50–1.10)
GFR calc Af Amer: >90 mL/min (ref >90)
GFR calc non Af Amer: >90 mL/min (ref >90)
Sodium: 133 mEq/L — ABNORMAL LOW (ref 135–145)

## 2011-08-08 LAB — D-DIMER, QUANTITATIVE: D-Dimer, Quant: 1.81 ug/mL-FEU — ABNORMAL HIGH (ref 0.00–0.48)

## 2011-08-08 NOTE — ED Provider Notes (Addendum)
History     CSN: [id number]  Arrival date & time 08/08/11  2252   First MD Initiated Contact with Patient 08/08/11 2307      Chief Complaint  Patient presents with  . Chest Pain    (Consider location/radiation/quality/duration/timing/severity/associated sxs/prior treatment) Patient is a 31 y.o. female presenting with chest pain. The history is provided by the patient.  Chest Pain The chest pain began 3 - 5 days ago. Chest pain occurs intermittently. The chest pain is unchanged. The pain is associated with breathing (Movement). At its most intense, the pain is at 7/10. The pain is currently at 0/10. The severity of the pain is moderate. The quality of the pain is described as sharp. The pain radiates to the left shoulder (Left shoulder upper back area). Chest pain is worsened by deep breathing and certain positions. Primary symptoms include nausea. Pertinent negatives for primary symptoms include no fever, no fatigue, no syncope, no shortness of breath, no cough, no wheezing, no palpitations, no abdominal pain, no vomiting and no dizziness.  Pertinent negatives for associated symptoms include no diaphoresis.    patient with new onset of chest pain left chest that goes back to the left shoulder upper back area on and off for 3 days comes and goes not present for very long  made worse with taking a deep breath and with sitting up and movement of the chest wall. Associated with nausea no vomiting no distinct shortness of breath followed by the health department. Has never had chest pain before.  Past Medical History  Diagnosis Date  . No pertinent past medical history     Past Surgical History  Procedure Date  . Cystectomy     Family History  Problem Relation Age of Onset  . Cancer Mother     History  Substance Use Topics  . Smoking status: Current Some Day Smoker -- .5 years    Types: Cigarettes  . Smokeless tobacco: Not on file  . Alcohol Use: No    OB History    Grav Para  Term Preterm Abortions TAB SAB Ect Mult Living   0               Review of Systems  Constitutional: Negative for fever, chills, diaphoresis and fatigue.  HENT: Negative for congestion and neck pain.   Eyes: Negative for redness.  Respiratory: Negative for cough, shortness of breath and wheezing.   Cardiovascular: Positive for chest pain. Negative for palpitations and syncope.  Gastrointestinal: Positive for nausea. Negative for vomiting and abdominal pain.  Genitourinary: Negative for dysuria.  Musculoskeletal: Positive for back pain.  Skin: Negative for rash.  Neurological: Negative for dizziness and headaches.  Hematological: Does not bruise/bleed easily.    Allergies  Other and Penicillins  Home Medications   Current Outpatient Rx  Name Route Sig Dispense Refill  . HYDROCODONE-ACETAMINOPHEN 5-325 MG PO TABS Oral Take 1 tablet by mouth every 6 (six) hours as needed. pain    . IBUPROFEN 200 MG PO TABS Oral Take 400 mg by mouth every 6 (six) hours as needed. pain      BP 112/68  Pulse 91  Temp(Src) 98.4 F (36.9 C) (Oral)  Resp 25  SpO2 100%  LMP 07/06/2011  Physical Exam  Nursing note and vitals reviewed. Constitutional: She is oriented to person, place, and time. She appears well-developed and well-nourished. No distress.  HENT:  Head: Normocephalic and atraumatic.  Mouth/Throat: Oropharynx is clear and moist.  Eyes: Conjunctivae and  EOM are normal. Pupils are equal, round, and reactive to light.  Neck: Normal range of motion. Neck supple.  Cardiovascular: Normal rate, regular rhythm and normal heart sounds.   No murmur heard. Pulmonary/Chest: Effort normal and breath sounds normal.  Abdominal: Soft. Bowel sounds are normal. There is no tenderness.  Musculoskeletal: Normal range of motion. She exhibits no edema and no tenderness.  Neurological: She is alert and oriented to person, place, and time. No cranial nerve deficit. She exhibits normal muscle tone.  Coordination normal.  Skin: Skin is warm. No rash noted. She is not diaphoretic.    ED Course  Procedures (including critical care time)  Labs Reviewed  CBC - Abnormal; Notable for the following:    WBC 10.8 (*)    Hemoglobin 7.8 (*) REPEATED TO VERIFY   HCT 26.2 (*)    MCV 64.1 (*)    MCH 19.1 (*)    MCHC 29.8 (*)    RDW 18.5 (*)    Platelets 572 (*)    All other components within normal limits  DIFFERENTIAL - Abnormal; Notable for the following:    Neutro Abs 7.9 (*)    All other components within normal limits  BASIC METABOLIC PANEL - Abnormal; Notable for the following:    Sodium 133 (*)    All other components within normal limits  D-DIMER, QUANTITATIVE - Abnormal; Notable for the following:    D-Dimer, Quant 1.81 (*)    All other components within normal limits  POCT I-STAT TROPONIN I   Date: 08/08/2011  Rate: 98  Rhythm: normal sinus rhythm  QRS Axis: normal  Intervals: normal  ST/T Wave abnormalities: nonspecific T wave changes  Conduction Disutrbances:borderline conduction delay WPW  Narrative Interpretation:   Old EKG Reviewed: none available 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  CHEST - 2 VIEW  Comparison: Chest radiograph performed 05/14/2009  Findings: The lungs are well-aerated and clear.  There is no evidence of focal opacification, pleural effusion or pneumothorax.  The heart is normal in size; the mediastinal contour is within normal limits.  No acute osseous abnormalities are seen.  IMPRESSION: No acute cardiopulmonary process seen.  Original Report Authenticated By: Tonia Ghent, M.D.   Ct Angio Chest W/cm &/or Wo Cm  08/09/2011  *RADIOLOGY REPORT*  Clinical Data: 31 year old female with chest pain associated with breathing.  CT ANGIOGRAPHY CHEST  Technique:  Multidetector CT imaging of the chest using the standard  protocol during bolus administration of intravenous contrast. Multiplanar reconstructed images including MIPs were obtained and reviewed to evaluate the vascular anatomy.  Contrast: OMNIPAQUE IOHEXOL 350 MG/ML SOLN  Comparison: Chest radiographs 08/08/2011 and earlier.  Findings: Suboptimal.   contrast bolus timing in the pulmonary arterial tree.  No convincing pulmonary artery filling defect.  Negative thoracic inlet.  Cardiomegaly.  No pericardial effusion. No pleural effusion.  No mediastinal lymphadenopathy.  Negative visualized upper abdominal viscera. Normal visualized aorta.  Major airways are patent.  Mild respiratory motion artifact in the apices and bases.  No abnormal pulmonary opacity.  Prominent axillary lymph nodes, greater on the left, favor related to large body habitus.  No acute osseous abnormality identified.  IMPRESSION: Suboptimal contrast timing in the pulmonary arterial system, but no convincing pulmonary embolus. 2.  Cardiomegaly.  No acute cardiopulmonary abnormality.  Original Report Authenticated By: Harley Hallmark, M.D.  Results for orders placed during the hospital encounter of 08/08/11  CBC      Component Value Range   WBC 10.8 (*) 4.0 - 10.5 (K/uL)   RBC 4.09  3.87 - 5.11 (MIL/uL)   Hemoglobin 7.8 (*) 12.0 - 15.0 (g/dL)   HCT 40.9 (*) 81.1 - 46.0 (%)   MCV 64.1 (*) 78.0 - 100.0 (fL)   MCH 19.1 (*) 26.0 - 34.0 (pg)   MCHC 29.8 (*) 30.0 - 36.0 (g/dL)   RDW 91.4 (*) 78.2 - 15.5 (%)   Platelets 572 (*) 150 - 400 (K/uL)  DIFFERENTIAL      Component Value Range   Neutrophils Relative 73  43 - 77 (%)   Lymphocytes Relative 21  12 - 46 (%)   Monocytes Relative 5  3 - 12 (%)   Eosinophils Relative 1  0 - 5 (%)   Basophils Relative 0  0 - 1 (%)   Neutro Abs 7.9 (*) 1.7 - 7.7 (K/uL)   Lymphs Abs 2.3  0.7 - 4.0 (K/uL)   Monocytes Absolute 0.5  0.1 - 1.0 (K/uL)   Eosinophils Absolute 0.1  0.0 - 0.7 (K/uL)   Basophils Absolute 0.0  0.0 - 0.1 (K/uL)   RBC Morphology  TARGET CELLS    BASIC METABOLIC PANEL      Component Value Range   Sodium 133 (*) 135 - 145 (mEq/L)   Potassium 3.5  3.5 - 5.1 (mEq/L)   Chloride 101  96 - 112 (mEq/L)   CO2 23  19 - 32 (mEq/L)   Glucose, Bld 91  70 - 99 (mg/dL)   BUN 11  6 - 23 (mg/dL)   Creatinine, Ser 9.56  0.50 - 1.10 (mg/dL)   Calcium 8.8  8.4 - 21.3 (mg/dL)   GFR calc non Af Amer >90  >90 (mL/min)   GFR calc Af Amer >90  >90 (mL/min)  D-DIMER, QUANTITATIVE      Component Value Range   D-Dimer, Quant 1.81 (*) 0.00 - 0.48 (ug/mL-FEU)  POCT I-STAT TROPONIN I      Component Value Range   Troponin i, poc 0.00  0.00 - 0.08 (ng/mL)   Comment 3              1. Chest wall pain       MDM  Patient symptoms not inconsistent with pulmonary embolism d-dimer elevated at greater than 1 CT and GU chest has been ordered. If pulmonary was present patient will require a [place]. 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 No evidence of acute cardiac event. First cardiac marker troponin negative. Chest x-ray without acute findings no pneumonia no pneumothorax no pulmonary edema no pleural effusion.  CT chest results noted. Will discuss with radiologist to see how convinced he is that is not evidence of a pulmonary embolism or whether we need to repeat the scan later today.  Discuss with radiology given the location of the patient's pain they are quite certain that no evidence of a PE. Patient also has no leg swelling no calf tenderness. 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  Also noted the anemia this may be due to vaginal blood loss. Patient without any GI blood loss by history. Patient will followup with her primary care doctor for that. Patient does not have any symptoms of feeling lightheaded or feeling like she's  going to pass out.  Patient is aware of her anemia status to  long-standing however I made her aware that its worst today. She says is not due to vaginal bleeding they have female figure out the cause.        Shelda Jakes, MD 08/09/11 0030  Shelda Jakes, MD 08/09/11 2041

## 2011-08-08 NOTE — ED Notes (Addendum)
Patient complaining of left sided chest pain (described as an "aching" feeling) that radiates down left arm as a "shooting" pain at times for the past three days.  Patient states that pain worsens when she lies down, when she sneezes, and when she lifts her left arm.  Patient reports recent shortness of breath, especially when lying down.  Patient denies cardiac history.

## 2011-08-08 NOTE — ED Notes (Signed)
Patient transported to X-ray 

## 2011-08-08 NOTE — ED Notes (Addendum)
Left side chest pain started 3 days ago and progressively getting worst, pt taking OTC motrin and Vicodin prescribed for her left leg and did get better.  Worst with any activities and better at rest.  Pt denies any hx of CP.

## 2011-08-09 ENCOUNTER — Emergency Department (HOSPITAL_COMMUNITY): Payer: No Typology Code available for payment source

## 2011-08-09 LAB — DIFFERENTIAL
Basophils Absolute: 0 10*3/uL (ref 0.0–0.1)
Lymphs Abs: 2.3 10*3/uL (ref 0.7–4.0)
Monocytes Absolute: 0.5 10*3/uL (ref 0.1–1.0)
Neutrophils Relative %: 73 % (ref 43–77)

## 2011-08-09 MED ORDER — SODIUM CHLORIDE 0.9 % IV BOLUS (SEPSIS)
250.0000 mL | Freq: Once | INTRAVENOUS | Status: AC
  Administered 2011-08-09: 250 mL via INTRAVENOUS

## 2011-08-09 MED ORDER — ONDANSETRON HCL 4 MG/2ML IJ SOLN
4.0000 mg | Freq: Once | INTRAMUSCULAR | Status: AC
  Administered 2011-08-09: 4 mg via INTRAVENOUS
  Filled 2011-08-09: qty 2

## 2011-08-09 MED ORDER — NAPROXEN 500 MG PO TABS: 500.0000 mg | ORAL_TABLET | Freq: Two times a day (BID) | ORAL | Status: DC

## 2011-08-09 MED ORDER — IOHEXOL 350 MG/ML SOLN
100.0000 mL | Freq: Once | INTRAVENOUS | Status: AC | PRN
  Administered 2011-08-09: 100 mL via INTRAVENOUS

## 2011-08-09 MED ORDER — HYDROMORPHONE HCL PF 1 MG/ML IJ SOLN
1.0000 mg | Freq: Once | INTRAMUSCULAR | Status: AC
  Administered 2011-08-09: 1 mg via INTRAVENOUS
  Filled 2011-08-09: qty 1

## 2011-08-09 MED ORDER — SODIUM CHLORIDE 0.9 % IV SOLN: INTRAVENOUS | Status: DC

## 2011-08-09 MED ORDER — HYDROCODONE-ACETAMINOPHEN 5-325 MG PO TABS: 1.0000 | ORAL_TABLET | Freq: Four times a day (QID) | ORAL | Status: DC | PRN

## 2011-08-09 NOTE — ED Notes (Signed)
Patient transported to CT Angio  

## 2011-08-14 ENCOUNTER — Emergency Department (HOSPITAL_COMMUNITY): Payer: No Typology Code available for payment source

## 2011-08-14 ENCOUNTER — Encounter (HOSPITAL_COMMUNITY): Payer: Self-pay | Admitting: *Deleted

## 2011-08-14 ENCOUNTER — Emergency Department (HOSPITAL_COMMUNITY)
Admission: EM | Admit: 2011-08-14 | Discharge: 2011-08-14 | Disposition: A | Discharge status: Home / Self Care | Payer: No Typology Code available for payment source | Attending: Emergency Medicine | Admitting: Emergency Medicine

## 2011-08-14 DIAGNOSIS — I517 Cardiomegaly: Secondary | ICD-10-CM | POA: Insufficient documentation

## 2011-08-14 DIAGNOSIS — R5381 Other malaise: Secondary | ICD-10-CM | POA: Insufficient documentation

## 2011-08-14 DIAGNOSIS — R55 Syncope and collapse: Secondary | ICD-10-CM | POA: Insufficient documentation

## 2011-08-14 DIAGNOSIS — H8110 Benign paroxysmal vertigo, unspecified ear: Secondary | ICD-10-CM | POA: Insufficient documentation

## 2011-08-14 DIAGNOSIS — M79609 Pain in unspecified limb: Secondary | ICD-10-CM | POA: Insufficient documentation

## 2011-08-14 LAB — URINALYSIS, ROUTINE W REFLEX MICROSCOPIC
Bilirubin Urine: NEGATIVE
Ketones, ur: NEGATIVE mg/dL
Nitrite: POSITIVE — AB
Specific Gravity, Urine: 1.027 (ref 1.005–1.030)
pH: 5.5 (ref 5.0–8.0)

## 2011-08-14 LAB — DIFFERENTIAL
Basophils Relative: 0 % (ref 0–1)
Eosinophils Absolute: 0.1 10*3/uL (ref 0.0–0.7)
Lymphs Abs: 2.9 10*3/uL (ref 0.7–4.0)
Monocytes Absolute: 0.4 10*3/uL (ref 0.1–1.0)
Monocytes Relative: 4 % (ref 3–12)

## 2011-08-14 LAB — CBC
HCT: 25.1 % — ABNORMAL LOW (ref 36.0–46.0)
Hemoglobin: 7.5 g/dL — ABNORMAL LOW (ref 12.0–15.0)
MCH: 19.1 pg — ABNORMAL LOW (ref 26.0–34.0)
MCHC: 29.9 g/dL — ABNORMAL LOW (ref 30.0–36.0)
RBC: 3.93 MIL/uL (ref 3.87–5.11)

## 2011-08-14 LAB — BASIC METABOLIC PANEL
BUN: 8 mg/dL (ref 6–23)
Chloride: 105 mEq/L (ref 96–112)
Creatinine, Ser: 0.78 mg/dL (ref 0.50–1.10)
GFR calc Af Amer: >90 mL/min (ref >90)
GFR calc non Af Amer: >90 mL/min (ref >90)
Glucose, Bld: 92 mg/dL (ref 70–99)

## 2011-08-14 LAB — D-DIMER, QUANTITATIVE: D-Dimer, Quant: 1.31 ug/mL-FEU — ABNORMAL HIGH (ref 0.00–0.48)

## 2011-08-14 LAB — URINE MICROSCOPIC-ADD ON

## 2011-08-14 MED ORDER — DEXAMETHASONE SODIUM PHOSPHATE 10 MG/ML IJ SOLN
10.0000 mg | Freq: Once | INTRAMUSCULAR | Status: AC
  Administered 2011-08-14: 10 mg via INTRAVENOUS
  Filled 2011-08-14: qty 1

## 2011-08-14 MED ORDER — DIPHENHYDRAMINE HCL 50 MG/ML IJ SOLN
25.0000 mg | Freq: Once | INTRAMUSCULAR | Status: AC
  Administered 2011-08-14: 25 mg via INTRAVENOUS
  Filled 2011-08-14: qty 1

## 2011-08-14 MED ORDER — SODIUM CHLORIDE 0.9 % IV SOLN
INTRAVENOUS | Status: DC
  Administered 2011-08-14: 23:00:00 via INTRAVENOUS

## 2011-08-14 MED ORDER — MECLIZINE HCL 25 MG PO TABS: 25.0000 mg | ORAL_TABLET | Freq: Three times a day (TID) | ORAL | Status: AC | PRN

## 2011-08-14 MED ORDER — SODIUM CHLORIDE 0.9 % IV BOLUS (SEPSIS)
1000.0000 mL | Freq: Once | INTRAVENOUS | Status: AC
  Administered 2011-08-14: 1000 mL via INTRAVENOUS

## 2011-08-14 MED ORDER — METOCLOPRAMIDE HCL 5 MG/ML IJ SOLN
10.0000 mg | Freq: Once | INTRAMUSCULAR | Status: AC
  Administered 2011-08-14: 10 mg via INTRAVENOUS
  Filled 2011-08-14: qty 2

## 2011-08-14 NOTE — ED Notes (Signed)
Pt states she has been feeling light headed and dizzy all day. Denies headache. Denies pain. gcs 15. Perrla.

## 2011-08-14 NOTE — Discharge Instructions (Signed)
Benign Positional Vertigo Vertigo means you feel like you or your surroundings are moving when they are not. Benign positional vertigo is the most common form of vertigo. Benign means that the cause of your condition is not serious. Benign positional vertigo is more common in older adults. CAUSES  Benign positional vertigo is the result of an upset in the labyrinth system. This is an area in the middle ear that helps control your balance. This may be caused by a viral infection, head injury, or repetitive motion. However, often no specific cause is found. SYMPTOMS  Symptoms of benign positional vertigo occur when you move your head or eyes in different directions. Some of the symptoms may include:  Loss of balance and falls.   Vomiting.   Blurred vision.   Dizziness.   Nausea.   Involuntary eye movements (nystagmus).  DIAGNOSIS  Benign positional vertigo is usually diagnosed by physical exam. If the specific cause of your benign positional vertigo is unknown, your caregiver may perform imaging tests, such as magnetic resonance imaging (MRI) or computed tomography (CT). TREATMENT  Your caregiver may recommend movements or procedures to correct the benign positional vertigo. Medicines such as meclizine, benzodiazepines, and medicines for nausea may be used to treat your symptoms. In rare cases, if your symptoms are caused by certain conditions that affect the inner ear, you may need surgery. HOME CARE INSTRUCTIONS   Follow your caregiver's instructions.   Move slowly. Do not make sudden body or head movements.   Avoid driving.   Avoid operating heavy machinery.   Avoid performing any tasks that would be dangerous to you or others during a vertigo episode.   Drink enough fluids to keep your urine clear or pale yellow.  SEEK IMMEDIATE MEDICAL CARE IF:   You develop problems with walking, weakness, numbness, or using your arms, hands, or legs.   You have difficulty speaking.   You  develop severe headaches.   Your nausea or vomiting continues or gets worse.   You develop visual changes.   Your family or friends notice any behavioral changes.   Your condition gets worse.   You have a fever.   You develop a stiff neck or sensitivity to light.  MAKE SURE YOU:   Understand these instructions.   Will watch your condition.   Will get help right away if you are not doing well or get worse.  Document Released: 12/29/2005 Document Revised: 03/12/2011 Document Reviewed: 12/11/2010 [facility] Patient Information 2012 [place], [place].Vertigo Vertigo means you feel like you or your surroundings are moving when they are not. Vertigo can be dangerous if it occurs when you are at work, driving, or performing difficult activities.  CAUSES  Vertigo occurs when there is a conflict of signals sent to your brain from the visual and sensory systems in your body. There are many different causes of vertigo, including:  Infections, especially in the inner ear.   A bad reaction to a drug or misuse of alcohol and medicines.   Withdrawal from drugs or alcohol.   Rapidly changing positions, such as lying down or rolling over in bed.   A migraine headache.   Decreased blood flow to the brain.   Increased pressure in the brain from a head injury, infection, tumor, or bleeding.  SYMPTOMS  You may feel as though the world is spinning around or you are falling to the ground. Because your balance is upset, vertigo can cause nausea and vomiting. You may have involuntary eye movements (  nystagmus). DIAGNOSIS  Vertigo is usually diagnosed by physical exam. If the cause of your vertigo is unknown, your caregiver may perform imaging tests, such as an MRI scan (magnetic resonance imaging). TREATMENT  Most cases of vertigo resolve on their own, without treatment. Depending on the cause, your caregiver may prescribe certain medicines. If your vertigo is related to body position issues, your  caregiver may recommend movements or procedures to correct the problem. In rare cases, if your vertigo is caused by certain inner ear problems, you may need surgery. HOME CARE INSTRUCTIONS   Follow your caregiver's instructions.   Avoid driving.   Avoid operating heavy machinery.   Avoid performing any tasks that would be dangerous to you or others during a vertigo episode.   Tell your caregiver if you notice that certain medicines seem to be causing your vertigo. Some of the medicines used to treat vertigo episodes can actually make them worse in some people.  SEEK IMMEDIATE MEDICAL CARE IF:   Your medicines do not relieve your vertigo or are making it worse.   You develop problems with talking, walking, weakness, or using your arms, hands, or legs.   You develop severe headaches.   Your nausea or vomiting continues or gets worse.   You develop visual changes.   A family member notices behavioral changes.   Your condition gets worse.  MAKE SURE YOU:  Understand these instructions.   Will watch your condition.   Will get help right away if you are not doing well or get worse.  Document Released: 12/31/2004 Document Revised: 03/12/2011 Document Reviewed: 10/09/2010 [facility] Patient Information 2012 [place], [place].

## 2011-08-14 NOTE — ED Provider Notes (Signed)
Screening Exam:  30yo F, c/o feeling "lightheaded" and "feeling like I'm going to pass out" all day today.  States she was told she was "anemic" and is concerned regarding her "blood counts."  SBP 100's during orthostatic VS, CTA, RRR, abd soft/NT, no nystagmus, neuro exam non-focal.  Labs, EKG, CXR, UA/preg ordered.      Laray Anger, DO 08/14/11 1948

## 2011-08-14 NOTE — ED Provider Notes (Signed)
History     CSN: [id number]  Arrival date & time 08/14/11  1800   First MD Initiated Contact with Patient 08/14/11 2210      Chief Complaint  Patient presents with  . Dizziness    (Consider location/radiation/quality/duration/timing/severity/associated sxs/prior treatment) HPI Comments: Patient here with a day history of dizziness especially with standing up - states no headache but photophobia and mild nausea.  Reports pain to right thigh as well - denies chest pain, shortness of breath, nausea, or vomiting - no fever, chills, .  Patient is a 31 y.o. female presenting with neurologic complaint. The history is provided by the patient. No language interpreter was used.  Neurologic Problem The primary symptoms include dizziness and visual change. Primary symptoms do not include headaches, syncope, loss of consciousness, altered mental status, seizures, paresthesias, focal weakness, loss of sensation, speech change, memory loss, fever, nausea or vomiting. The symptoms began 6 to 12 hours ago. The symptoms are unchanged. The neurological symptoms are diffuse.  Dizziness also occurs with weakness. Dizziness does not occur with tinnitus, nausea or vomiting.  The visual change does not include photophobia.  Additional symptoms include weakness, pain, leg pain and vertigo. Additional symptoms do not include neck stiffness, lower back pain, loss of balance, photophobia, aura, hallucinations, nystagmus, taste disturbance, hyperacusis, hearing loss, tinnitus, anxiety, irritability or dysphoric mood.    Past Medical History  Diagnosis Date  . No pertinent past medical history     Past Surgical History  Procedure Date  . Cystectomy     Family History  Problem Relation Age of Onset  . Cancer Mother     History  Substance Use Topics  . Smoking status: Current Some Day Smoker -- .5 years    Types: Cigarettes  . Smokeless tobacco: Not on file  . Alcohol Use: No    OB History    Grav  Para Term Preterm Abortions TAB SAB Ect Mult Living   0               Review of Systems  Constitutional: Negative for fever and irritability.  HENT: Negative for hearing loss, neck stiffness and tinnitus.   Eyes: Negative for photophobia.  Cardiovascular: Negative for syncope.  Gastrointestinal: Negative for nausea and vomiting.  Neurological: Positive for dizziness, vertigo and weakness. Negative for speech change, focal weakness, seizures, loss of consciousness, headaches, paresthesias and loss of balance.  Psychiatric/Behavioral: Negative for hallucinations, memory loss, dysphoric mood and altered mental status.  All other systems reviewed and are negative.    Allergies  Other and Penicillins  Home Medications   Current Outpatient Rx  Name Route Sig Dispense Refill  . HYDROCODONE-ACETAMINOPHEN 5-325 MG PO TABS Oral Take 1-2 tablets by mouth every 6 (six) hours as needed for pain. 10 tablet 0  . IBUPROFEN 200 MG PO TABS Oral Take 400 mg by mouth every 6 (six) hours as needed. pain    . NAPROXEN 500 MG PO TABS Oral Take 1 tablet (500 mg total) by mouth 2 (two) times daily. 14 tablet 0    BP 120/72  Pulse 84  Temp(Src) 98.2 F (36.8 C) (Oral)  Resp 18  SpO2 97%  LMP 07/06/2011  Physical Exam  Nursing note and vitals reviewed. Constitutional: She is oriented to person, place, and time. She appears well-developed and well-nourished. No distress.  HENT:  Head: Normocephalic and atraumatic.  Right Ear: External ear normal.  Left Ear: External ear normal.  Nose: Nose normal.  Mouth/Throat: Oropharynx is  clear and moist. No oropharyngeal exudate.  Eyes: Conjunctivae and EOM are normal. Pupils are equal, round, and reactive to light. No scleral icterus. Right eye exhibits normal extraocular motion and no nystagmus. Left eye exhibits no nystagmus.  Neck: Normal range of motion. Neck supple.  Cardiovascular: Normal rate, regular rhythm and normal heart sounds.  Exam reveals no  gallop and no friction rub.   No murmur heard. Pulmonary/Chest: Effort normal and breath sounds normal. No respiratory distress. She has no wheezes. She has no rales. She exhibits no tenderness.  Abdominal: Soft. Bowel sounds are normal. She exhibits no distension. There is no tenderness.  Musculoskeletal: Normal range of motion. She exhibits tenderness. She exhibits no edema.       Pain to palpation of right anterior thigh muscle  Lymphadenopathy:    She has no cervical adenopathy.  Neurological: She is alert and oriented to person, place, and time. She has normal reflexes. She displays normal reflexes. No cranial nerve deficit. She exhibits normal muscle tone. Coordination normal.  Skin: Skin is warm and dry. No rash noted. No erythema. No pallor.  Psychiatric: She has a normal mood and affect. Her behavior is normal. Judgment and thought content normal.    ED Course  Procedures (including critical care time)  Labs Reviewed  GLUCOSE, CAPILLARY - Abnormal; Notable for the following:    Glucose-Capillary 114 (*)    All other components within normal limits  CBC - Abnormal; Notable for the following:    Hemoglobin 7.5 (*)    HCT 25.1 (*)    MCV 63.9 (*)    MCH 19.1 (*)    MCHC 29.9 (*)    RDW 18.5 (*)    Platelets 514 (*)    All other components within normal limits  BASIC METABOLIC PANEL - Abnormal; Notable for the following:    Potassium 3.3 (*)    All other components within normal limits  URINALYSIS, ROUTINE W REFLEX MICROSCOPIC - Abnormal; Notable for the following:    APPearance CLOUDY (*)    Hgb urine dipstick MODERATE (*)    Nitrite POSITIVE (*)    Leukocytes, UA SMALL (*)    All other components within normal limits  D-DIMER, QUANTITATIVE - Abnormal; Notable for the following:    D-Dimer, Quant 1.31 (*)    All other components within normal limits  URINE MICROSCOPIC-ADD ON - Abnormal; Notable for the following:    Squamous Epithelial / LPF FEW (*)    Bacteria, UA  MANY (*)    All other components within normal limits  DIFFERENTIAL  PREGNANCY, URINE   Dg Chest 2 View  08/14/2011  *RADIOLOGY REPORT*  Clinical Data: Near-syncope.  CHEST - 2 VIEW  Comparison: CT chest 08/09/2011 and plain films of the chest 08/08/2011.  Findings: There is cardiomegaly but no edema.  No pneumothorax or effusion.  No focal bony abnormality.  IMPRESSION: Cardiomegaly without acute disease.  Original Report Authenticated By: Bernadene Bell. Maricela Curet, M.D.     Vertigo    MDM  Paitient with elevated d-dimer, which is the same during her work up for chest pain and shortness of breath and she was negative for PE, she reports feeling better after the medication for the dizziness.  Will discharge home with the same.        Izola Price [place], [place] 08/14/11 2334

## 2011-08-14 NOTE — ED Notes (Signed)
Pt to ED with c/o of feeling light headed and dizzy.  Onset today while at work.  Pt denies pain/discomfort at this time.

## 2011-08-15 NOTE — ED Provider Notes (Signed)
Medical screening examination/treatment/procedure(s) were performed by non-physician practitioner and as supervising physician I was immediately available for consultation/collaboration.   Carleene Cooper III, MD 08/15/11 [phone]

## 2011-08-18 ENCOUNTER — Telehealth: Payer: Self-pay | Admitting: Family Medicine

## 2011-08-18 ENCOUNTER — Ambulatory Visit (INDEPENDENT_AMBULATORY_CARE_PROVIDER_SITE_OTHER): Payer: PRIVATE HEALTH INSURANCE | Admitting: Medical

## 2011-08-18 ENCOUNTER — Encounter: Payer: Self-pay | Admitting: Medical

## 2011-08-18 ENCOUNTER — Other Ambulatory Visit: Payer: Self-pay | Admitting: Medical

## 2011-08-18 VITALS — BP 100/70 | HR 84 | Temp 98.2°F | Resp 16 | Ht 68.5 in | Wt 254.0 lb

## 2011-08-18 DIAGNOSIS — L0293 Carbuncle, unspecified: Secondary | ICD-10-CM

## 2011-08-18 DIAGNOSIS — L0292 Furuncle, unspecified: Secondary | ICD-10-CM

## 2011-08-18 DIAGNOSIS — R9431 Abnormal electrocardiogram [ECG] [EKG]: Secondary | ICD-10-CM

## 2011-08-18 DIAGNOSIS — R10819 Abdominal tenderness, unspecified site: Secondary | ICD-10-CM

## 2011-08-18 DIAGNOSIS — D649 Anemia, unspecified: Secondary | ICD-10-CM | POA: Insufficient documentation

## 2011-08-18 DIAGNOSIS — L732 Hidradenitis suppurativa: Secondary | ICD-10-CM | POA: Insufficient documentation

## 2011-08-18 LAB — CBC WITH DIFFERENTIAL/PLATELET
Eosinophils Absolute: 0.1 10*3/uL (ref 0.0–0.7)
Eosinophils Relative: 1 % (ref 0–5)
Lymphs Abs: 2.7 10*3/uL (ref 0.7–4.0)
MCH: 18.4 pg — ABNORMAL LOW (ref 26.0–34.0)
MCV: 65.1 fL — ABNORMAL LOW (ref 78.0–100.0)
Monocytes Absolute: 0.3 10*3/uL (ref 0.1–1.0)
Monocytes Relative: 4 % (ref 3–12)
Platelets: 650 10*3/uL — ABNORMAL HIGH (ref 150–400)
RBC: 4.3 MIL/uL (ref 3.87–5.11)

## 2011-08-18 LAB — BASIC METABOLIC PANEL
CO2: 24 mEq/L (ref 19–32)
Calcium: 8.9 mg/dL (ref 8.4–10.5)
Chloride: 106 mEq/L (ref 96–112)
Creat: 0.65 mg/dL (ref 0.50–1.10)
Glucose, Bld: 83 mg/dL (ref 70–99)
Sodium: 137 mEq/L (ref 135–145)

## 2011-08-18 LAB — POCT URINALYSIS DIPSTICK
Bilirubin, UA: NEGATIVE
Blood, UA: NEGATIVE
Leukocytes, UA: NEGATIVE
Nitrite, UA: NEGATIVE
Protein, UA: NEGATIVE
Urobilinogen, UA: NEGATIVE
pH, UA: 6

## 2011-08-18 LAB — IRON AND TIBC
%SAT: 3 % — ABNORMAL LOW (ref 20–55)
Iron: 10 ug/dL — ABNORMAL LOW (ref 42–145)
TIBC: 332 ug/dL (ref 250–470)

## 2011-08-18 LAB — HEPATIC FUNCTION PANEL
Bilirubin, Direct: 0.2 mg/dL (ref 0.0–0.3)
Indirect Bilirubin: 0 mg/dL (ref 0.0–0.9)
Total Protein: 8.4 g/dL — ABNORMAL HIGH (ref 6.0–8.3)

## 2011-08-18 LAB — RETICULOCYTES
ABS Retic: 55.9 10*3/uL (ref 19.0–186.0)
RBC.: 4.3 MIL/uL (ref 3.87–5.11)

## 2011-08-18 MED ORDER — FERROUS GLUCONATE 324 (38 FE) MG PO TABS: 324.0000 mg | ORAL_TABLET | Freq: Three times a day (TID) | ORAL | Status: DC

## 2011-08-18 MED ORDER — DOXYCYCLINE HYCLATE 100 MG PO TABS: 100.0000 mg | ORAL_TABLET | Freq: Two times a day (BID) | ORAL | Status: DC

## 2011-08-18 NOTE — Progress Notes (Addendum)
Subjective:   HPI  Amanda Davenport is a 31 y.o. female who presents as a new patient today, and here for hospital f/u.   Was recently seen in the ED.  In the past has not had a primary care provider.  She has gone to the health dept prior for gynecology visits, but for acute issues, usually goes to the ED.    She reports 2 wk ago going to the ED for chest pain.  Was having pain with deep inspiration.  Had chest xray and CT scan to help look for blood clots.  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  Was discharged and told to return if lightheaded.    She reports going back to the ED this past Friday for leg pain and dizziness.  Labs still showed anemia, low potassium, and blood clot blood test D-dimer abnormal.  Was borderline in needing transfusion, was given meclizine which she hasn't taken.  Today she reports improving some with dizziness, but still feels tired, light hurts her eyes.  She notes boils under each arm and between her butt cheeks currently.  She notes long hx/o boils recurrent that never seem to go away.  She denies hx/o MRSA, has never been on antibiotic for these boils.  She notes being on menstrual period currently, has always been anemic, has purchased OTC iron, but hasn't been taking this.  Denies family hx/o anemia.  She notes hx/o iron deficiency in the past.  Denies every being on prescription iron.  Denies heavy periods, no active bleeding.  Denies alcohol or tobacco use.   No other aggravating or relieving factors.    No other c/o.  The following portions of the patient's history were reviewed and updated as appropriate: allergies, current medications, past family history, past medical history, past social history, past surgical history and problem list.  Past Medical History  Diagnosis Date  . Allergy   . Anemia   . Recurrent boils   . Nearsightedness     wears glasses  . Chronic headache     Allergies  Allergen Reactions  . Other     All Antibiotics cause severe  vaginal yeast infections  . Penicillins Hives, Itching and Swelling   Review of Systems Constitutional: -fever, -chills, -sweats, -unexpected -weight change,+fatigue ENT: -runny nose, -ear pain, -sore throat Cardiology:  +chest pain, +palpitations, -edema Respiratory: -cough, -shortness of breath, -wheezing Gastroenterology: -abdominal pain, -nausea, -vomiting, -diarrhea, -constipation  Hematology: -bleeding or bruising problems Ophthalmology: -vision changes Urology: -dysuria, -difficulty urinating, -hematuria, -urinary frequency, -urgency Neurology: -headache, -weakness, -tingling, -numbness       Objective:   Physical Exam  General appearance: alert, no distress, WD/WN, black female, pleasant Skin: bilat axilla with numerous contiguous fluctuant, tender some indurated, lesions with obvious purulent discharge c/w hidradenitis suppurative with current abscess.  bilat buttocks with multiple fluctuance lesions similar to the axilla HEENT: normocephalic, sclerae anicteric, TMs pearly, nares patent, no discharge or erythema, pharynx normal Oral cavity: MMM, no lesions Neck: supple, no lymphadenopathy, no thyromegaly, no masses Heart: RRR, normal S1, S2, no murmurs Lungs: CTA bilaterally, no wheezes, rhonchi, or rales Abdomen: +bs, soft, generalized tenderness, mild, non distended, no masses, no hepatomegaly, no splenomegaly Pulses: 2+ symmetric, upper and lower extremities, normal cap refill Rectal: anus with anterior hemorrhoid, nonthrombosed or tender, occult negative stool  Assessment and Plan :     Encounter Diagnoses  Name Primary?  Marland Kitchen Anemia Yes  . Hidradenitis suppurativa   . Boil   .  Abdominal tenderness   . Abnormal EKG   I reviewed her recent ED reports, labs, studies.  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    Anemia - stat labs today, and we'll have to determine whether she needs  transfusion vs managing this as outpatient with close f/u.  We discussed likely need for aggressive iron therapy.  Hidradenitis suppurative with obvious abscess and infection today - pending stat labs, will place on antibiotic and she will ultimately need to see general surgery for this in the next few months at the latest  Boil on buttocks, multiple - will call out antibiotic pending labs.  Abdominal tenderness - Urinalysis negative.  Pending labs.  Abnormal EKG per ED EKG recording.  We will refer to cardiology once we have a better handle on her anemia status today.

## 2011-08-18 NOTE — Progress Notes (Signed)
Addended by: Jac Canavan on: 08/18/2011 02:59 PM   Modules accepted: Level of Service

## 2011-08-19 ENCOUNTER — Telehealth: Payer: Self-pay | Admitting: Internal Medicine

## 2011-08-19 ENCOUNTER — Other Ambulatory Visit: Payer: Self-pay | Admitting: Medical

## 2011-08-19 MED ORDER — SULFAMETHOXAZOLE-TRIMETHOPRIM 800-160 MG PO TABS: 1.0000 | ORAL_TABLET | Freq: Two times a day (BID) | ORAL | Status: DC

## 2011-08-19 NOTE — Progress Notes (Signed)
PATIENT WAS NOTIFIED OF HER DIFFERENT MEDICATION AND TO F/U IN 1 MONTH. SHE WAS ALSO MADE AWARE OF HER LAB RESULTS AS WELL. CLS

## 2011-08-19 NOTE — Telephone Encounter (Signed)
I GOT THE PATIENTS PHARMACY ON THE PHONE FOR SHANE TYSINGER PA-C. CLS

## 2011-08-19 NOTE — Telephone Encounter (Signed)
i have left 2 msg with her pharmacy.  She needs antibiotic, and Doxycycline shouldn't cost anywhere near $100.  pls get them on the phone for me.

## 2011-08-19 NOTE — Telephone Encounter (Signed)
PATIENT WAS NOTIFIED AND SHANE TYSINGER PA-C SENT INTO THE PHARMACY ANTIBIOTIC AND SOME IRON MEDICATION. CLS

## 2011-08-22 LAB — WOUND CULTURE

## 2011-08-25 ENCOUNTER — Emergency Department (HOSPITAL_COMMUNITY)
Admission: EM | Admit: 2011-08-25 | Discharge: 2011-08-25 | Disposition: A | Discharge status: Home / Self Care | Payer: PRIVATE HEALTH INSURANCE | Attending: Emergency Medicine | Admitting: Emergency Medicine

## 2011-08-25 ENCOUNTER — Emergency Department (HOSPITAL_COMMUNITY): Payer: PRIVATE HEALTH INSURANCE

## 2011-08-25 ENCOUNTER — Encounter (HOSPITAL_COMMUNITY): Payer: Self-pay | Admitting: *Deleted

## 2011-08-25 DIAGNOSIS — R110 Nausea: Secondary | ICD-10-CM | POA: Insufficient documentation

## 2011-08-25 DIAGNOSIS — R0602 Shortness of breath: Secondary | ICD-10-CM | POA: Insufficient documentation

## 2011-08-25 DIAGNOSIS — D649 Anemia, unspecified: Secondary | ICD-10-CM | POA: Insufficient documentation

## 2011-08-25 DIAGNOSIS — R079 Chest pain, unspecified: Secondary | ICD-10-CM | POA: Insufficient documentation

## 2011-08-25 DIAGNOSIS — D473 Essential (hemorrhagic) thrombocythemia: Secondary | ICD-10-CM | POA: Insufficient documentation

## 2011-08-25 LAB — DIFFERENTIAL
Basophils Relative: 0 % (ref 0–1)
Eosinophils Absolute: 0.1 10*3/uL (ref 0.0–0.7)
Eosinophils Relative: 1 % (ref 0–5)
Lymphocytes Relative: 23 % (ref 12–46)
Neutrophils Relative %: 71 % (ref 43–77)

## 2011-08-25 LAB — POCT I-STAT TROPONIN I: Troponin i, poc: 0 ng/mL (ref 0.00–0.08)

## 2011-08-25 LAB — POCT I-STAT, CHEM 8
Calcium, Ion: 1.08 mmol/L — ABNORMAL LOW (ref 1.12–1.32)
Chloride: 106 mEq/L (ref 96–112)
HCT: 28 % — ABNORMAL LOW (ref 36.0–46.0)
Hemoglobin: 9.5 g/dL — ABNORMAL LOW (ref 12.0–15.0)
TCO2: 25 mmol/L (ref 0–100)

## 2011-08-25 LAB — URINALYSIS, ROUTINE W REFLEX MICROSCOPIC
Ketones, ur: NEGATIVE mg/dL
Nitrite: NEGATIVE
Protein, ur: NEGATIVE mg/dL
Urobilinogen, UA: 1 mg/dL (ref 0.0–1.0)
pH: 6 (ref 5.0–8.0)

## 2011-08-25 LAB — CBC
Hemoglobin: 7.2 g/dL — ABNORMAL LOW (ref 12.0–15.0)
MCHC: 29 g/dL — ABNORMAL LOW (ref 30.0–36.0)
Platelets: 689 10*3/uL — ABNORMAL HIGH (ref 150–400)
RBC: 3.92 MIL/uL (ref 3.87–5.11)

## 2011-08-25 MED ORDER — DIPHENHYDRAMINE HCL 25 MG PO CAPS
25.0000 mg | ORAL_CAPSULE | Freq: Once | ORAL | Status: AC
  Administered 2011-08-25: 25 mg via ORAL
  Filled 2011-08-25: qty 1

## 2011-08-25 NOTE — ED Notes (Signed)
Pt c/o itching scant amount of hives noted to upper arms. MD informed. Removed gown.

## 2011-08-25 NOTE — ED Provider Notes (Signed)
History     CSN: [id number]  Arrival date & time 08/25/11  1751   First MD Initiated Contact with Patient 08/25/11 1923      Chief Complaint  Patient presents with  . Chest Pain  . Shortness of Breath    (Consider location/radiation/quality/duration/timing/severity/associated sxs/prior treatment) The history is provided by the patient.   patient presents with sharp right-sided chest pain. Worse with movement. Worse with exertion. He set some mild shortness of breath. She's also had a little bit nauseous. She she's previously been seen for left-sided chest pain and had pulmonary and was rule out at that time. She's also see her primary care Dr. Pain is worse with palpation. It is sharp. She's occasional smoker.  Past Medical History  Diagnosis Date  . Allergy   . Anemia   . Recurrent boils   . Nearsightedness     wears glasses  . Chronic headache     Past Surgical History  Procedure Date  . Cystectomy     tonsils    Family History  Problem Relation Age of Onset  . Cancer Mother     breast, stomach  . Pulmonary embolism Mother     died of PE  . Diabetes Paternal Grandmother   . Heart disease Neg Hx   . Stroke Neg Hx     History  Substance Use Topics  . Smoking status: Current Some Day Smoker -- .5 years    Types: Cigarettes  . Smokeless tobacco: Not on file  . Alcohol Use: No    OB History    Grav Para Term Preterm Abortions TAB SAB Ect Mult Living   0               Review of Systems  Constitutional: Negative for activity change and appetite change.  HENT: Negative for neck stiffness.   Eyes: Negative for pain.  Respiratory: Positive for shortness of breath. Negative for chest tightness.   Cardiovascular: Positive for chest pain. Negative for leg swelling.  Gastrointestinal: Negative for nausea, vomiting, abdominal pain and diarrhea.  Genitourinary: Negative for flank pain.  Musculoskeletal: Negative for back pain.  Skin: Positive for rash.    Neurological: Negative for weakness, numbness and headaches.  Psychiatric/Behavioral: Negative for behavioral problems.    Allergies  Other and Penicillins  Home Medications   Current Outpatient Rx  Name Route Sig Dispense Refill  . ASPIRIN 325 MG PO TABS Oral Take 325 mg by mouth once.    Marland Kitchen CRANBERRY 450 MG PO TABS Oral Take 1 tablet by mouth 2 (two) times daily as needed.    Marland Kitchen FERROUS GLUCONATE 324 (38 FE) MG PO TABS Oral Take 324 mg by mouth 2 (two) times daily.    Marland Kitchen PRENATAL 27-0.8 MG PO TABS Oral Take 1 tablet by mouth daily.    . SULFAMETHOXAZOLE-TRIMETHOPRIM 800-160 MG PO TABS Oral Take 1 tablet by mouth 2 (two) times daily. 20 tablet 0  . TURMERIC 450 MG PO CAPS Oral Take 1 capsule by mouth 2 (two) times daily.    Marland Kitchen MECLIZINE HCL 25 MG PO TABS Oral Take 1 tablet (25 mg total) by mouth 3 (three) times daily as needed. 30 tablet 0    BP 107/62  Pulse 105  Temp(Src) 99.4 F (37.4 C) (Oral)  Resp 20  SpO2 97%  LMP 08/18/2011  Physical Exam  Nursing note and vitals reviewed. Constitutional: She is oriented to person, place, and time. She appears well-developed and well-nourished.  Patient is obese  HENT:  Head: Normocephalic and atraumatic.  Eyes: EOM are normal. Pupils are equal, round, and reactive to light.  Neck: Normal range of motion. Neck supple.  Cardiovascular: Normal rate, regular rhythm and normal heart sounds.   No murmur heard. Pulmonary/Chest: Effort normal and breath sounds normal. No respiratory distress. She has no wheezes. She has no rales. She exhibits tenderness.       Tenderness to right chest. Worse with movement of right arm.  Abdominal: Soft. Bowel sounds are normal. She exhibits no distension. There is no tenderness. There is no rebound and no guarding.  Musculoskeletal: Normal range of motion.  Neurological: She is alert and oriented to person, place, and time. No cranial nerve deficit.  Skin: Skin is warm and dry. Rash noted.        Patient developed further comments in her bilateral upper arms after her arrival to the ER  Psychiatric: She has a normal mood and affect. Her speech is normal.    ED Course  Procedures (including critical care time)  Labs Reviewed  CBC - Abnormal; Notable for the following:    WBC 11.9 (*)    Hemoglobin 7.2 (*)    HCT 24.8 (*)    MCV 63.3 (*)    MCH 18.4 (*)    MCHC 29.0 (*)    RDW 18.7 (*)    Platelets 689 (*)    All other components within normal limits  DIFFERENTIAL - Abnormal; Notable for the following:    Neutro Abs 8.5 (*)    All other components within normal limits  URINALYSIS, ROUTINE W REFLEX MICROSCOPIC - Abnormal; Notable for the following:    APPearance CLOUDY (*)    Hgb urine dipstick TRACE (*)    Leukocytes, UA MODERATE (*)    All other components within normal limits  POCT I-STAT, CHEM 8 - Abnormal; Notable for the following:    Glucose, Bld 112 (*)    Calcium, Ion 1.08 (*)    Hemoglobin 9.5 (*)    HCT 28.0 (*)    All other components within normal limits  POCT I-STAT TROPONIN I  URINE MICROSCOPIC-ADD ON   Dg Chest 2 View  08/25/2011  *RADIOLOGY REPORT*  Clinical Data: Chest pain, fever.  CHEST - 2 VIEW  Comparison: 08/14/2011  Findings: Heart is borderline enlarged.  Lungs are clear.  No effusions.  No acute bony abnormality.  IMPRESSION: Cardiomegaly.  No active disease or change.  Original Report Authenticated By: Cyndie Chime, M.D.     1. Chest pain   2. Anemia   3. Thrombocytosis      Date: 08/25/2011  Rate: 101  Rhythm: sinus tachycardia  QRS Axis: normal  Intervals: normal  ST/T Wave abnormalities: normal  Conduction Disutrbances:none  Narrative Interpretation: Possible delta waves. Present on previous EKG  Old EKG Reviewed: unchanged    MDM  Patient with sharp right-sided chest pain. Reproducible. She's been previously worked up for pulmonary embolism. He was negative. 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 She's not been having syncope. She'll  need her primary care Dr. to arrange followup with cardiology and likely with hematology for her anemia and thrombocytosis.        Juliet Rude. Rubin Payor, MD 08/25/11 2108

## 2011-08-25 NOTE — ED Notes (Signed)
Pt to xray

## 2011-08-25 NOTE — ED Notes (Signed)
Pt ambulate without distress. Pt states itching is gone.

## 2011-08-25 NOTE — ED Notes (Signed)
Pt in c/o chest pain and shortness of breath x2-3 hours, also nausea, states she has been having heart problems over the last month but has not been able to follow up with a cardiologist yet

## 2011-08-25 NOTE — ED Notes (Signed)
Bed:WA16<BR> Expected date:<BR> Expected time:<BR> Means of arrival:<BR> Comments:<BR> Hold for triage

## 2011-08-25 NOTE — Discharge Instructions (Signed)
Chest Pain (Nonspecific) It is often hard to give a specific diagnosis for the cause of chest pain. There is always a chance that your pain could be related to something serious, such as a heart attack or a blood clot in the lungs. You need to follow up with your caregiver for further evaluation. CAUSES   Heartburn.   Pneumonia or bronchitis.   Anxiety or stress.   Inflammation around your heart (pericarditis) or lung (pleuritis or pleurisy).   A blood clot in the lung.   A collapsed lung (pneumothorax). It can develop suddenly on its own (spontaneous pneumothorax) or from injury (trauma) to the chest.   Shingles infection (herpes zoster virus).  The chest wall is composed of bones, muscles, and cartilage. Any of these can be the source of the pain.  The bones can be bruised by injury.   The muscles or cartilage can be strained by coughing or overwork.   The cartilage can be affected by inflammation and become sore (costochondritis).  DIAGNOSIS  Lab tests or other studies, such as X-rays, electrocardiography, stress testing, or cardiac imaging, may be needed to find the cause of your pain.  TREATMENT   Treatment depends on what may be causing your chest pain. Treatment may include:   Acid blockers for heartburn.   Anti-inflammatory medicine.   Pain medicine for inflammatory conditions.   Antibiotics if an infection is present.   You may be advised to change lifestyle habits. This includes stopping smoking and avoiding alcohol, caffeine, and chocolate.   You may be advised to keep your head raised (elevated) when sleeping. This reduces the chance of acid going backward from your stomach into your esophagus.   Most of the time, nonspecific chest pain will improve within 2 to 3 days with rest and mild pain medicine.  HOME CARE INSTRUCTIONS   If antibiotics were prescribed, take your antibiotics as directed. Finish them even if you start to feel better.   For the next few  days, avoid physical activities that bring on chest pain. Continue physical activities as directed.   Do not smoke.   Avoid drinking alcohol.   Only take over-the-counter or prescription medicine for pain, discomfort, or fever as directed by your caregiver.   Follow your caregiver's suggestions for further testing if your chest pain does not go away.   Keep any follow-up appointments you made. If you do not go to an appointment, you could develop lasting (chronic) problems with pain. If there is any problem keeping an appointment, you must call to reschedule.  SEEK MEDICAL CARE IF:   You think you are having problems from the medicine you are taking. Read your medicine instructions carefully.   Your chest pain does not go away, even after treatment.   You develop a rash with blisters on your chest.  SEEK IMMEDIATE MEDICAL CARE IF:   You have increased chest pain or pain that spreads to your arm, neck, jaw, back, or abdomen.   You develop shortness of breath, an increasing cough, or you are coughing up blood.   You have severe back or abdominal pain, feel nauseous, or vomit.   You develop severe weakness, fainting, or chills.   You have a fever.  THIS IS AN EMERGENCY. Do not wait to see if the pain will go away. Get medical help at once. Call your local emergency services (911 in [place] Do not drive yourself to the hospital. MAKE SURE YOU:   Understand these instructions.     Will watch your condition.   Will get help right away if you are not doing well or get worse.  Document Released: 12/31/2004 Document Revised: 03/12/2011 Document Reviewed: 10/27/2007 [facility] Patient Information 2012 [place], [place].Anemia, Nonspecific Your exam and blood tests show you are anemic. This means your blood (hemoglobin) level is low. Normal hemoglobin values are 12 to 15 g/dL for females and 14 to 17 g/dL for males. Make a note of your hemoglobin level today. The hematocrit percent is also used  to measure anemia. A normal hematocrit is 38% to 46% in females and 42% to 49% in males. Make a note of your hematocrit level today. CAUSES  Anemia can be due to many different causes.  Excessive bleeding from periods (in women).   Intestinal bleeding.   Poor nutrition.   Kidney, thyroid, liver, and bone marrow diseases.  SYMPTOMS  Anemia can come on suddenly (acute). It can also come on slowly. Symptoms can include:  Minor weakness.   Dizziness.   Palpitations.   Shortness of breath.  Symptoms may be absent until half your hemoglobin is missing if it comes on slowly. Anemia due to acute blood loss from an injury or internal bleeding may require blood transfusion if the loss is severe. Hospital care is needed if you are anemic and there is significant continual blood loss. TREATMENT   Stool tests for blood (Hemoccult) and additional lab tests are often needed. This determines the best treatment.   Further checking on your condition and your response to treatment is very important. It often takes many weeks to correct anemia.  Depending on the cause, treatment can include:  Supplements of iron.   Vitamins B12 and folic acid.   Hormone medicines.If your anemia is due to bleeding, finding the cause of the blood loss is very important. This will help avoid further problems.  SEEK IMMEDIATE MEDICAL CARE IF:   You develop fainting, extreme weakness, shortness of breath, or chest pain.   You develop heavy vaginal bleeding.   You develop bloody or black, tarry stools or vomit up blood.   You develop a high fever, rash, repeated vomiting, or dehydration.  Document Released: 04/30/2004 Document Revised: 03/12/2011 Document Reviewed: 02/05/2009 [facility] Patient Information 2012 [place], [place].

## 2011-08-27 ENCOUNTER — Emergency Department (HOSPITAL_COMMUNITY)
Admission: EM | Admit: 2011-08-27 | Discharge: 2011-08-27 | Disposition: A | Discharge status: Home / Self Care | Payer: No Typology Code available for payment source | Attending: Emergency Medicine | Admitting: Emergency Medicine

## 2011-08-27 ENCOUNTER — Encounter (HOSPITAL_COMMUNITY): Payer: Self-pay | Admitting: *Deleted

## 2011-08-27 DIAGNOSIS — A5901 Trichomonal vulvovaginitis: Secondary | ICD-10-CM | POA: Insufficient documentation

## 2011-08-27 DIAGNOSIS — R21 Rash and other nonspecific skin eruption: Secondary | ICD-10-CM | POA: Insufficient documentation

## 2011-08-27 DIAGNOSIS — B370 Candidal stomatitis: Secondary | ICD-10-CM | POA: Insufficient documentation

## 2011-08-27 HISTORY — DX: Cardiac arrhythmia, unspecified: I49.9

## 2011-08-27 LAB — WET PREP, GENITAL: Clue Cells Wet Prep HPF POC: NONE SEEN

## 2011-08-27 MED ORDER — AZITHROMYCIN 250 MG PO TABS
1000.0000 mg | ORAL_TABLET | Freq: Once | ORAL | Status: AC
  Administered 2011-08-27: 1000 mg via ORAL
  Filled 2011-08-27: qty 4

## 2011-08-27 MED ORDER — HYDROXYZINE HCL 25 MG PO TABS
25.0000 mg | ORAL_TABLET | Freq: Once | ORAL | Status: AC
  Administered 2011-08-27: 25 mg via ORAL
  Filled 2011-08-27: qty 1

## 2011-08-27 MED ORDER — LIDOCAINE HCL (PF) 1 % IJ SOLN
INTRAMUSCULAR | Status: AC
  Filled 2011-08-27: qty 5

## 2011-08-27 MED ORDER — LIDOCAINE VISCOUS 2 % MT SOLN: 20.0000 mL | OROMUCOSAL | Status: AC | PRN

## 2011-08-27 MED ORDER — NYSTATIN 100000 UNIT/ML MT SUSP: 500000.0000 [IU] | Freq: Four times a day (QID) | OROMUCOSAL | Status: AC

## 2011-08-27 MED ORDER — METRONIDAZOLE 500 MG PO TABS
2000.0000 mg | ORAL_TABLET | Freq: Once | ORAL | Status: AC
  Administered 2011-08-27: 2000 mg via ORAL
  Filled 2011-08-27: qty 4

## 2011-08-27 MED ORDER — CEFTRIAXONE SODIUM 250 MG IJ SOLR
250.0000 mg | Freq: Once | INTRAMUSCULAR | Status: DC
  Filled 2011-08-27: qty 250

## 2011-08-27 NOTE — ED Notes (Signed)
Gave patient Ice pr PA Brigitte  for thrush on tongue/ pt states is has helped with the pain on her tongue

## 2011-08-27 NOTE — ED Notes (Signed)
Pt reports thrush to tongue and yeast infection from antibiotics that she is own for boils.

## 2011-08-27 NOTE — ED Provider Notes (Signed)
History     CSN: [id number]  Arrival date & time 08/27/11  1240   First MD Initiated Contact with Amanda Davenport 08/27/11 1504     3:14 PM HPI Amanda Davenport is a 31 y.o. female complaining of Vaginal discharge and thrush. Reports she was placed on bactrim 4 days ago for recurrent abscesses. State almost immediately began having a thick white discharge. And white plaques on her tongue. Reports associated vulvar pruritis, and mild red macular rash on right forearm and chest. Report symptoms are symptoms to prior symptoms after taking antibiotics. Denies urinary symptoms, abdominal pain, back pain, fever, diarrhea, constipation, SOB, CP, n/v  Amanda Davenport is a 31 y.o. female presenting with vaginal discharge. The history is provided by the Amanda Davenport.  Vaginal Discharge This is a new problem. Episode onset: 4 days ago. The problem occurs constantly. The problem has been gradually worsening. Associated symptoms include a rash. Pertinent negatives include no abdominal pain, chest pain, chills, congestion, coughing, fever, myalgias, nausea, numbness, sore throat, urinary symptoms, vomiting or weakness. The symptoms are aggravated by nothing. Treatments tried: OTC yeast infection vaginal inserts. The treatment provided no relief.    Past Medical History  Diagnosis Date  . Allergy   . Anemia   . Recurrent boils   . Nearsightedness     wears glasses  . Chronic headache   . Arrhythmia     Past Surgical History  Procedure Date  . Cystectomy     tonsils    Family History  Problem Relation Age of Onset  . Cancer Mother     breast, stomach  . Pulmonary embolism Mother     died of PE  . Diabetes Paternal Grandmother   . Heart disease Neg Hx   . Stroke Neg Hx     History  Substance Use Topics  . Smoking status: Current Some Day Smoker -- .5 years    Types: Cigarettes  . Smokeless tobacco: Not on file  . Alcohol Use: No    OB History    Grav Para Term Preterm Abortions TAB SAB Ect Mult Living     0               Review of Systems  Constitutional: Negative for fever and chills.  HENT: Positive for mouth sores. Negative for congestion and sore throat.   Respiratory: Negative for cough and shortness of breath.   Cardiovascular: Negative for chest pain.  Gastrointestinal: Negative for nausea, vomiting, abdominal pain, diarrhea and constipation.  Genitourinary: Positive for vaginal discharge. Negative for dysuria, urgency, frequency, hematuria, flank pain and vaginal pain.  Musculoskeletal: Negative for myalgias and back pain.  Skin: Positive for rash.  Neurological: Negative for weakness and numbness.  All other systems reviewed and are negative.    Allergies  Other and Penicillins  Home Medications   Current Outpatient Rx  Name Route Sig Dispense Refill  . ASPIRIN 325 MG PO TABS Oral Take 325 mg by mouth once.    . FERROUS GLUCONATE 324 (38 FE) MG PO TABS Oral Take 324 mg by mouth 2 (two) times daily.    . AZO TABS PO Oral Take 1 tablet by mouth 2 (two) times daily as needed. For yeast    . PRENATAL 27-0.8 MG PO TABS Oral Take 1 tablet by mouth daily.    . SULFAMETHOXAZOLE-TRIMETHOPRIM 800-160 MG PO TABS Oral Take 1 tablet by mouth 2 (two) times daily. Starting 5/20 for 10 days    . TURMERIC 450 MG PO  CAPS Oral Take 1 capsule by mouth 2 (two) times daily.      BP 122/67  Pulse 119  Temp(Src) 98.9 F (37.2 C) (Oral)  Resp 18  SpO2 99%  LMP 08/18/2011  Physical Exam  Vitals reviewed. Constitutional: She is oriented to person, place, and time. Vital signs are normal. She appears well-developed and well-nourished.  HENT:  Head: Normocephalic and atraumatic. No trismus in the jaw.  Nose: Nose normal.  Mouth/Throat: Uvula is midline, oropharynx is clear and moist and mucous membranes are normal. Oral lesions (White plaques noted on tongue unable to remove her tongue depressor. Oropharynx however is normal.) present. No dental abscesses or uvula swelling.  Eyes:  Conjunctivae are normal. Pupils are equal, round, and reactive to light.  Neck: Normal range of motion. Neck supple. No Brudzinski's sign and no Kernig's sign noted.  Cardiovascular: Normal rate, regular rhythm and normal heart sounds.  Exam reveals no friction rub.   No murmur heard. Pulmonary/Chest: Effort normal and breath sounds normal. She has no wheezes. She has no rhonchi. She has no rales. She exhibits no tenderness.  Abdominal: Soft. Bowel sounds are normal. She exhibits no distension and no mass. There is no tenderness. There is no rebound and no guarding.  Genitourinary: There is no tenderness or lesion on the right labia. There is no tenderness or lesion on the left labia. Uterus is not tender. Cervix exhibits discharge. Cervix exhibits no motion tenderness. There is tenderness around the vagina. No bleeding around the vagina. Vaginal discharge found.  Musculoskeletal: Normal range of motion.  Lymphadenopathy:    She has no cervical adenopathy.  Neurological: She is alert and oriented to person, place, and time. Coordination normal.  Skin: Skin is warm and dry. Rash (Large erythematous macular lesion on right upper arm, Left and right breat. no drainage, fluctuance, mass. Multiple excoriations.) noted. No erythema. No pallor.    ED Course  Procedures   Results for orders placed during the hospital encounter of 08/27/11  WET PREP, GENITAL      Component Value Range   Yeast Wet Prep HPF POC NONE SEEN  NONE SEEN    Trich, Wet Prep FEW (*) NONE SEEN    Clue Cells Wet Prep HPF POC NONE SEEN  NONE SEEN    WBC, Wet Prep HPF POC MANY (*) NONE SEEN     MDM   Amanda Davenport treated with 1 g Zithromax and 2 g Flagyl. For the Amanda Davenport to have treatment with ceftriaxone 250 mg IM for gonorrhea but  Amanda Davenport refused shot. Advised to call back for lab results to see if she would require ceftriaxone IM or other treatment for gonorrhea in 2-3 days. Will treat oral thrush with nystatin lozenges since  Amanda Davenport reports that has worked for her in the past. Will treat it she rash with hydrocortisone. Advised Amanda Davenport should symptoms worsen to return to the ED or followup with her primary care physician. Amanda Davenport voices understanding and is ready for discharge        Thomasene Lot, Cordelia Poche 08/27/11 1835

## 2011-08-27 NOTE — Discharge Instructions (Signed)
Please call back on Monday to determine whether test for gonorrhea was positive. You were treated today for Chlamydia and Trichomonas. He may use lidocaine as a mouthwash as a topical aide to help with vaginal pain.  Candida Infection, Adult A candida infection (also called yeast, fungus and Monilia infection) is an overgrowth of yeast that can occur anywhere on the body. A yeast infection commonly occurs in warm, moist body areas. Usually, the infection remains localized but can spread to become a systemic infection. A yeast infection may be a sign of a more severe disease such as diabetes, leukemia, or AIDS. A yeast infection can occur in both men and women. In women, Candida vaginitis is a vaginal infection. It is one of the most common causes of vaginitis. Men usually do not have symptoms or know they have an infection until other problems develop. Men may find out they have a yeast infection because their sex partner has a yeast infection. Uncircumcised men are more likely to get a yeast infection than circumcised men. This is because the uncircumcised glans is not exposed to air and does not remain as dry as that of a circumcised glans. Older adults may develop yeast infections around dentures. CAUSES  Women  Antibiotics.   Steroid medication taken for a long time.   Being overweight (obese).   Diabetes.   Poor immune condition.   Certain serious medical conditions.   Immune suppressive medications for organ transplant patients.   Chemotherapy.   Pregnancy.   Menstration.   Stress and fatigue.   Intravenous drug use.   Oral contraceptives.   Wearing tight-fitting clothes in the crotch area.   Catching it from a sex partner who has a yeast infection.   Spermicide.   Intravenous, urinary, or other catheters.  Men  Catching it from a sex partner who has a yeast infection.   Having oral or anal sex with a person who has the infection.   Spermicide.   Diabetes.    Antibiotics.   Poor immune system.   Medications that suppress the immune system.   Intravenous drug use.   Intravenous, urinary, or other catheters.  SYMPTOMS  Women  Thick, white vaginal discharge.   Vaginal itching.   Redness and swelling in and around the vagina.   Irritation of the lips of the vagina and perineum.   Blisters on the vaginal lips and perineum.   Painful sexual intercourse.   Low blood sugar (hypoglycemia).   Painful urination.   Bladder infections.   Intestinal problems such as constipation, indigestion, bad breath, bloating, increase in gas, diarrhea, or loose stools.  Men  Men may develop intestinal problems such as constipation, indigestion, bad breath, bloating, increase in gas, diarrhea, or loose stools.   Dry, cracked skin on the penis with itching or discomfort.   Jock itch.   Dry, flaky skin.   Athlete's foot.   Hypoglycemia.  DIAGNOSIS  Women  A history and an exam are performed.   The discharge may be examined under a microscope.   A culture may be taken of the discharge.  Men  A history and an exam are performed.   Any discharge from the penis or areas of cracked skin will be looked at under the microscope and cultured.   Stool samples may be cultured.  TREATMENT  Women  Vaginal antifungal suppositories and creams.   Medicated creams to decrease irritation and itching on the outside of the vagina.   Warm compresses to the  perineal area to decrease swelling and discomfort.   Oral antifungal medications.   Medicated vaginal suppositories or cream for repeated or recurrent infections.   Wash and dry the irritation areas before applying the cream.   Eating yogurt with lactobacillus may help with prevention and treatment.   Sometimes painting the vagina with gentian violet solution may help if creams and suppositories do not work.  Men  Antifungal creams and oral antifungal medications.   Sometimes  treatment must continue for 30 days after the symptoms go away to prevent recurrence.  HOME CARE INSTRUCTIONS  Women  Use cotton underwear and avoid tight-fitting clothing.   Avoid colored, scented toilet paper and deodorant tampons or pads.   Do not douche.   Keep your diabetes under control.   Finish all the prescribed medications.   Keep your skin clean and dry.   Consume milk or yogurt with lactobacillus active culture regularly. If you get frequent yeast infections and think that is what the infection is, there are over-the-counter medications that you can get. If the infection does not show healing in 3 days, talk to your caregiver.   Tell your sex partner you have a yeast infection. Your partner may need treatment also, especially if your infection does not clear up or recurs.  Men  Keep your skin clean and dry.   Keep your diabetes under control.   Finish all prescribed medications.   Tell your sex partner that you have a yeast infection so they can be treated if necessary.  SEEK MEDICAL CARE IF:   Your symptoms do not clear up or worsen in one week after treatment.   You have an oral temperature above 102 F (38.9 C).   You have trouble swallowing or eating for a prolonged time.   You develop blisters on and around your vagina.   You develop vaginal bleeding and it is not your menstrual period.   You develop abdominal pain.   You develop intestinal problems as mentioned above.   You get weak or lightheaded.   You have painful or increased urination.   You have pain during sexual intercourse.  MAKE SURE YOU:   Understand these instructions.   Will watch your condition.   Will get help right away if you are not doing well or get worse.  Document Released: 04/30/2004 Document Revised: 03/12/2011 Document Reviewed: 08/12/2009 [facility] Patient Information 2012 [place], [place].Thrush, Adult  Ginette Pitman is a yeast infection that develops in the mouth and  throat and on the tongue. The medical term for this is oropharyngeal candidiasis, or OPC. Ginette Pitman is most common in older adults, but it can occur at any age. Ginette Pitman occurs when a yeast called candida grows out of control. Candida normally is present in small amounts in the mouth and on other mucous membranes. However, under certain circumstances, candida can grow rapidly, causing thrush. Ginette Pitman can be a recurring problem for people who have chronic illnesses or who take medications that limit the body's ability to fight infection (weakened immune system). Since these people have difficulty fighting infections, the fungus that causes thrush can spread throughout the body. This can cause life-threatening blood or organ infections. CAUSES  Candida, the yeast that causes thrush, is normally present in small amounts in the mouth and on other mucous membranes. It usually causes no harm. However, when conditions are present that allow the yeast to grow uncontrolled, it invades surrounding tissues and becomes an infection. Ginette Pitman is most commonly caused by the yeast  Candida albicans. Less often, other forms of candida can lead to thrush. There are many types of bacteria in your mouth that normally control the growth of candida. Sometimes a new type of bacteria gets into your mouth and disrupts the balance of the germs already there. This can allow candida to overgrow. Other factors that increase your risk of developing thrush include:  An impaired ability to fight infection (weakened immune system). A normal immune system is usually strong enough to prevent candida from overgrowing.   Older adults are more likely to develop thrush because they may have weaker immune systems.   People with human immunodeficiency virus (HIV) infection have a high likelihood of developing thrush. About 90% of people with HIV develop thrush at some point during the course of their disease.   People with diabetes are more likely to  get thrush because high blood sugar levels promote overgrowth of the candida fungus.   A dry mouth (xerostomia). Dry mouth can result from overuse of mouthwashes or from certain conditions such as Sjgren's syndrome.   Pregnancy. Hormone changes during pregnancy can lead to thrush by altering the balance of bacteria in the mouth.   Poor dental care, especially in people who have false teeth.   The use of antibiotic medications. This may lead to thrush by changing the balance of bacteria in the mouth.  SYMPTOMS  Thrush can be a mild infection that causes no symptoms. If symptoms develop, they may include the following:  A burning feeling in the mouth and throat. This can occur at the start of a thrush infection.   White patches that adhere to the mouth and tongue. The tissue around the patches may be red, raw, and painful. If rubbed (during tooth brushing, for example), the patches and the tissue of the mouth may bleed easily.   A bad taste in the mouth or difficulty tasting foods.   Cottony feeling in the mouth.   Sometimes pain during eating and swallowing.  DIAGNOSIS  Your caregiver can usually diagnose thrush by exam. In addition to looking in your mouth, your caregiver will ask you questions about your health. TREATMENT  Medications that help prevent the growth of fungi (antifungals) are the standard treatment for thrush. These medications are either applied directly to the affected area (topical) or swallowed (oral). Mild thrush In adults, mild cases of thrush may clear up with simple treatment that can be done at home. This treatment usually involves using an antifungal mouth rinse or lozenges. Treatment usually lasts about 14 days. Moderate to severe thrush  More severe thrush infections that have spread to the esophagus are treated with an oral antifungal medication. A topical antifungal medication may also be used.   For some severe infections, a treatment period longer than  14 days may be needed.   Oral antifungal medications are almost never used during pregnancy because the fetus may be harmed. However, if a pregnant woman has a rare, severe thrush infection that has spread to her blood, oral antifungal medications may be used. In this case, the risk of harm to the mother and fetus from the severe thrush infection may be greater than the risk posed by the use of antifungal medications.  Persistent or recurrent thrush Persistent (does not go away) or recurrent (keeps coming back) cases of thrush may:  Need to be treated twice as long as the symptoms last.   Require treatment with both oral and topical antifungal medications.   People with weakened  immune systems can take an antifungal medication on a continuous basis to prevent thrush infections.  It is important to treat conditions that make you more likely to get thrush, such as diabetes, human immunodeficiency virus (HIV), or cancer.  HOME CARE INSTRUCTIONS   If you are breast-feeding, you should clean your nipples with an antifungal medication, such as nystatin (Mycostatin). Dry your nipples after breast-feeding. Applying lanolin-containing body lotion may help relieve nipple soreness.   If you wear dentures and get thrush, remove dentures before going to bed, brush them vigorously, and soak in a solution of chlorhexidine gluconate or a product such as Polident or Efferdent.   Eating plain, unflavored yogurt that contains live cultures (check the label) can also help cure thrush. Yogurt helps healthy bacteria grow in the mouth. These bacteria stop the growth of the yeast that causes thrush.   Adults can treat thrush at home with gentian violet (1%), a dye that kills bacteria and fungi. It is available without a prescription. If there is no known cause for the infection or if gentian violet does not cure the thrush, you need to see your caregiver.  Comfort measures Measures can be taken to reduce the  discomfort of thrush:  Drink cold liquids such as water or iced tea. Eat flavored ice treats or frozen juices.   Eat foods that are easy to swallow such as gelatin, ice cream, or custard.   If the patches are painful, try drinking from a straw.   Rinse your mouth several times a day with a warm saltwater rinse. You can make the saltwater mixture with 1 tsp (5 g) of salt in 8 fl oz (0.2 L) of warm water.  PROGNOSIS   Most cases of thrush are mild and clear up with the use of an antifungal mouth rinse or lozenges. Very mild cases of thrush may clear up without medical treatment. It usually takes about 14 days of treatment with an oral antifungal medication to cure more severe thrush infections. In some cases, thrush may last several weeks even with treatment.   If thrush goes untreated and does not go away by itself, it can spread to other parts of the body.   Thrush can spread to the throat, the vagina, or the skin. It rarely spreads to other organs of the body.  Ginette Pitman is more likely to recur (come back) in:  People who use inhaled corticosteroids to treat asthma.   People who take antibiotic medications for a long time.   People who have false teeth.   People who have a weakened immune system.  RISKS AND COMPLICATIONS Complications related to thrush are rare in healthy people. There are several factors that can increase your risk of developing thrush. Age Older adults, especially those who have serious health problems, are more likely to develop thrush because their immune systems are likely to be weaker. Behavior  The yeast that causes thrush can be spread by oral sex.   Heavy smoking can lower the body's ability to fight off infections. This makes thrush more likely to develop.  Other conditions  False teeth (dentures), braces, or a retainer that irritates the mouth make it hard to keep the mouth clean. An unclean mouth is more likely to develop thrush than a clean mouth.    People with a weakened immune system, such as those who have diabetes or human immunodeficiency virus (HIV) or who are undergoing chemotherapy, have an increased risk for developing thrush.  Medications Some medications  can allow the fungus that causes thrush to grow uncontrolled. Common ones are:  Antibiotics, especially those that kill a wide range of organisms (broad-spectrum antibiotics), such as tetracycline commonly can cause thrush.   Birth control pills (oral contraceptives).   Medications that weaken the body's immune system, such as corticosteroids.  Environment Exposure over time to certain environmental chemicals, such as benzene and pesticides, can weaken the body's immune system. This increases your risk for developing infections, including thrush. SEEK IMMEDIATE MEDICAL CARE IF:  Your symptoms are getting worse or are not improving within 7 days of starting treatment.   You have symptoms of spreading infection, such as white patches on the skin outside of the mouth.   You are nursing and you have redness and pain in the nipples in spite of home treatment or if you have burning pain in the nipple area when you nurse. Your baby's mouth should also be examined to determine whether thrush is causing your symptoms.  Document Released: 12/17/2003 Document Revised: 03/12/2011 Document Reviewed: 03/28/2008 [facility] Patient Information 2012 [place], [place].Trichomoniasis Trichomoniasis is an infection, caused by the Trichomonas organism, that affects both women and men. In women, the outer female genitalia and the vagina are affected. In men, the penis is mainly affected, but the prostate and other reproductive organs can also be involved. Trichomoniasis is a sexually transmitted disease (STD) and is most often passed to another person through sexual contact. The majority of people who get trichomoniasis do so from a sexual encounter and are also at risk for other STDs. CAUSES    Sexual intercourse with an infected partner.   It can be present in swimming pools or hot tubs.  SYMPTOMS   Abnormal gray-green frothy vaginal discharge in women.   Vaginal itching and irritation in women.   Itching and irritation of the area outside the vagina in women.   Penile discharge with or without pain in males.   Inflammation of the urethra (urethritis), causing painful urination.   Bleeding after sexual intercourse.  RELATED COMPLICATIONS  Pelvic inflammatory disease.   Infection of the uterus (endometritis).   Infertility.   Tubal (ectopic) pregnancy.   It can be associated with other STDs, including gonorrhea and chlamydia, hepatitis B, and HIV.  COMPLICATIONS DURING PREGNANCY  Early (premature) delivery.   Premature rupture of the membranes (PROM).   Low birth weight.  DIAGNOSIS   Visualization of Trichomonas under the microscope from the vagina discharge.   Ph of the vagina greater than 4.5, tested with a test tape.   Trich Rapid Test.   Culture of the organism, but this is not usually needed.   It may be found on a Pap test.   Having a "strawberry cervix,"which means the cervix looks very red like a strawberry.  TREATMENT   You may be given medication to fight the infection. Inform your caregiver if you could be or are pregnant. Some medications used to treat the infection should not be taken during pregnancy.   Over-the-counter medications or creams to decrease itching or irritation may be recommended.   Your sexual partner will need to be treated if infected.  HOME CARE INSTRUCTIONS   Take all medication prescribed by your caregiver.   Take over-the-counter medication for itching or irritation as directed by your caregiver.   Do not have sexual intercourse while you have the infection.   Do not douche or wear tampons.   Discuss your infection with your partner, as your partner may have acquired  the infection from you. Or, your  partner may have been the person who transmitted the infection to you.   Have your sex partner examined and treated if necessary.   Practice safe, informed, and protected sex.   See your caregiver for other STD testing.  SEEK MEDICAL CARE IF:   You still have symptoms after you finish the medication.   You have an oral temperature above 102 F (38.9 C).   You develop belly (abdominal) pain.   You have pain when you urinate.   You have bleeding after sexual intercourse.   You develop a rash.   The medication makes you sick or makes you throw up (vomit).  Document Released: 09/16/2000 Document Revised: 03/12/2011 Document Reviewed: 10/12/2008 [facility] Patient Information 2012 [place], [place].

## 2011-08-27 NOTE — ED Notes (Addendum)
Pt states she also has been having generalized itching for the last two days.

## 2011-08-27 NOTE — ED Notes (Signed)
Pt has scattered red raised itchy splotches for last 2 days.  NO respiratory issues

## 2011-08-28 LAB — GC/CHLAMYDIA PROBE AMP, GENITAL
Chlamydia, DNA Probe: NEGATIVE
GC Probe Amp, Genital: NEGATIVE

## 2011-08-28 NOTE — ED Provider Notes (Signed)
Medical screening examination/treatment/procedure(s) were performed by non-physician practitioner and as supervising physician I was immediately available for consultation/collaboration.  Flint Melter, MD 08/28/11 [phone]

## 2012-01-28 ENCOUNTER — Observation Stay (HOSPITAL_COMMUNITY)
Admission: EM | Admit: 2012-01-28 | Discharge: 2012-01-30 | Disposition: A | Discharge status: Home / Self Care | Payer: PRIVATE HEALTH INSURANCE | Attending: Internal Medicine | Admitting: Internal Medicine

## 2012-01-28 ENCOUNTER — Encounter (HOSPITAL_COMMUNITY): Payer: Self-pay | Admitting: *Deleted

## 2012-01-28 DIAGNOSIS — R002 Palpitations: Secondary | ICD-10-CM

## 2012-01-28 DIAGNOSIS — R5383 Other fatigue: Secondary | ICD-10-CM

## 2012-01-28 DIAGNOSIS — D649 Anemia, unspecified: Secondary | ICD-10-CM

## 2012-01-28 DIAGNOSIS — M25579 Pain in unspecified ankle and joints of unspecified foot: Secondary | ICD-10-CM

## 2012-01-28 DIAGNOSIS — R079 Chest pain, unspecified: Secondary | ICD-10-CM | POA: Insufficient documentation

## 2012-01-28 DIAGNOSIS — L732 Hidradenitis suppurativa: Secondary | ICD-10-CM

## 2012-01-28 DIAGNOSIS — R5381 Other malaise: Secondary | ICD-10-CM | POA: Insufficient documentation

## 2012-01-28 DIAGNOSIS — IMO0002 Reserved for concepts with insufficient information to code with codable children: Secondary | ICD-10-CM

## 2012-01-28 DIAGNOSIS — R109 Unspecified abdominal pain: Secondary | ICD-10-CM | POA: Insufficient documentation

## 2012-01-28 DIAGNOSIS — R42 Dizziness and giddiness: Secondary | ICD-10-CM | POA: Insufficient documentation

## 2012-01-28 DIAGNOSIS — D509 Iron deficiency anemia, unspecified: Principal | ICD-10-CM

## 2012-01-28 DIAGNOSIS — R000 Tachycardia, unspecified: Secondary | ICD-10-CM

## 2012-01-28 DIAGNOSIS — R0602 Shortness of breath: Secondary | ICD-10-CM | POA: Insufficient documentation

## 2012-01-28 DIAGNOSIS — R110 Nausea: Secondary | ICD-10-CM | POA: Insufficient documentation

## 2012-01-28 LAB — COMPREHENSIVE METABOLIC PANEL
Alkaline Phosphatase: 59 U/L (ref 39–117)
BUN: 12 mg/dL (ref 6–23)
CO2: 23 mEq/L (ref 19–32)
Calcium: 8.8 mg/dL (ref 8.4–10.5)
GFR calc Af Amer: >90 mL/min (ref >90)
GFR calc non Af Amer: >90 mL/min (ref >90)
Glucose, Bld: 80 mg/dL (ref 70–99)
Total Protein: 9.2 g/dL — ABNORMAL HIGH (ref 6.0–8.3)

## 2012-01-28 LAB — CBC WITH DIFFERENTIAL/PLATELET
Basophils Relative: 0 % (ref 0–1)
Eosinophils Absolute: 0.1 10*3/uL (ref 0.0–0.7)
Eosinophils Relative: 1 % (ref 0–5)
HCT: 25.4 % — ABNORMAL LOW (ref 36.0–46.0)
Hemoglobin: 7.4 g/dL — ABNORMAL LOW (ref 12.0–15.0)
MCH: 18.5 pg — ABNORMAL LOW (ref 26.0–34.0)
MCHC: 29.1 g/dL — ABNORMAL LOW (ref 30.0–36.0)
MCV: 63.5 fL — ABNORMAL LOW (ref 78.0–100.0)
Monocytes Absolute: 0.8 10*3/uL (ref 0.1–1.0)
Neutro Abs: 7.2 10*3/uL (ref 1.7–7.7)
RBC: 4 MIL/uL (ref 3.87–5.11)

## 2012-01-28 LAB — URINALYSIS, ROUTINE W REFLEX MICROSCOPIC
Bilirubin Urine: NEGATIVE
Ketones, ur: 15 mg/dL — AB
Leukocytes, UA: NEGATIVE
Nitrite: NEGATIVE
Specific Gravity, Urine: 1.035 — ABNORMAL HIGH (ref 1.005–1.030)
Urobilinogen, UA: 0.2 mg/dL (ref 0.0–1.0)
pH: 5.5 (ref 5.0–8.0)

## 2012-01-28 LAB — PREPARE RBC (CROSSMATCH)

## 2012-01-28 LAB — RETICULOCYTES
RBC.: 3.95 MIL/uL (ref 3.87–5.11)
Retic Count, Absolute: 43.5 10*3/uL (ref 19.0–186.0)
Retic Ct Pct: 1.1 % (ref 0.4–3.1)

## 2012-01-28 LAB — URINE MICROSCOPIC-ADD ON

## 2012-01-28 MED ORDER — DOXYCYCLINE HYCLATE 100 MG PO TABS
100.0000 mg | ORAL_TABLET | Freq: Two times a day (BID) | ORAL | Status: DC
  Administered 2012-01-28 – 2012-01-30 (×4): 100 mg via ORAL
  Filled 2012-01-28 (×5): qty 1

## 2012-01-28 MED ORDER — SODIUM CHLORIDE 0.9 % IV BOLUS (SEPSIS)
1000.0000 mL | Freq: Once | INTRAVENOUS | Status: AC
  Administered 2012-01-28: 1000 mL via INTRAVENOUS

## 2012-01-28 MED ORDER — SODIUM CHLORIDE 0.9 % IV SOLN
INTRAVENOUS | Status: AC
  Administered 2012-01-29: 03:00:00 via INTRAVENOUS

## 2012-01-28 MED ORDER — ONDANSETRON HCL 4 MG PO TABS: 4.0000 mg | ORAL_TABLET | Freq: Four times a day (QID) | ORAL | Status: DC | PRN

## 2012-01-28 MED ORDER — ACETAMINOPHEN 325 MG PO TABS
650.0000 mg | ORAL_TABLET | Freq: Four times a day (QID) | ORAL | Status: DC | PRN
  Administered 2012-01-29: 650 mg via ORAL
  Filled 2012-01-28: qty 2

## 2012-01-28 MED ORDER — SODIUM CHLORIDE 0.9 % IV SOLN
INTRAVENOUS | Status: DC
  Administered 2012-01-29: 11:00:00 via INTRAVENOUS

## 2012-01-28 MED ORDER — ONDANSETRON HCL 4 MG/2ML IJ SOLN: 4.0000 mg | Freq: Three times a day (TID) | INTRAMUSCULAR | Status: DC | PRN

## 2012-01-28 MED ORDER — SODIUM CHLORIDE 0.9 % IJ SOLN
3.0000 mL | Freq: Two times a day (BID) | INTRAMUSCULAR | Status: DC
  Administered 2012-01-29 – 2012-01-30 (×3): 3 mL via INTRAVENOUS

## 2012-01-28 MED ORDER — PANTOPRAZOLE SODIUM 40 MG IV SOLR
40.0000 mg | Freq: Two times a day (BID) | INTRAVENOUS | Status: DC
  Administered 2012-01-29 – 2012-01-30 (×3): 40 mg via INTRAVENOUS
  Filled 2012-01-28 (×5): qty 40

## 2012-01-28 MED ORDER — ONDANSETRON HCL 4 MG/2ML IJ SOLN: 4.0000 mg | Freq: Four times a day (QID) | INTRAMUSCULAR | Status: DC | PRN

## 2012-01-28 MED ORDER — TRAMADOL HCL 50 MG PO TABS
50.0000 mg | ORAL_TABLET | Freq: Four times a day (QID) | ORAL | Status: DC | PRN
  Administered 2012-01-29 – 2012-01-30 (×2): 50 mg via ORAL
  Filled 2012-01-28 (×2): qty 1

## 2012-01-28 MED ORDER — ACETAMINOPHEN 650 MG RE SUPP: 650.0000 mg | Freq: Four times a day (QID) | RECTAL | Status: DC | PRN

## 2012-01-28 NOTE — Consult Note (Signed)
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 Jacky Davenport is an 31 y.o. female.  HPI: Amanda Davenport is a 31 yo woman with PMH of hidradenitis suppuritiva, significant anemia and prior EKG with evidence of preexcitation. Today, she presents with several weeks of gradually progressive exertional dyspnea and weakness. She has some associated dizziness and palpitations when standing for longer periods (upwards of a few minutes). She was here in May with similar symptoms when her EKG was initially found. She does not endorse any heavy menses or rectal bleeding. Cardiology consulted given palpitations and prior EKG and current EKG with evidence of preexcitation. She does not give a history of ever having had significant tachycardia requiring medical therapy. Her only notation of any sort of tachycardia is her palpitations which she endorses for a significant period of time (? Years).   Past Medical History  Diagnosis Date  . Allergy   . Anemia   . Recurrent boils   . Nearsightedness     wears glasses  . Chronic headache   . Arrhythmia     Past Surgical History  Procedure Date  . Cystectomy     tonsils    Family History  Problem Relation Age of Onset  . Cancer Mother     breast, stomach  . Pulmonary embolism Mother     died of PE  . Diabetes Paternal Grandmother   . Heart disease Neg Hx   . Stroke Neg Hx     Social History:  reports that she has quit smoking. Her smoking use included Cigarettes. She quit after .5 years of use. She does not have any smokeless tobacco history on file. She reports that she does not drink alcohol or use illicit drugs.  Allergies:  Allergies  Allergen Reactions  . Other     All Antibiotics cause severe vaginal yeast infections  . Penicillins Hives, Itching and Swelling    Medications: I have reviewed the patient's current medications.  Review of Systems  Constitutional:  Positive for malaise/fatigue. Negative for fever and chills.  HENT: Negative for hearing loss, ear pain and neck pain.   Eyes: Negative for pain and discharge.  Respiratory: Positive for shortness of breath. Negative for hemoptysis and sputum production.   Cardiovascular: Positive for palpitations. Negative for chest pain, orthopnea and claudication.  Gastrointestinal: Negative for heartburn, nausea and vomiting.  Genitourinary: Negative for frequency and hematuria.  Musculoskeletal: Negative for back pain.  Skin: Negative for itching and rash.  Neurological: Positive for dizziness and weakness. Negative for tremors, focal weakness, seizures and headaches.  Endo/Heme/Allergies: Negative for environmental allergies. Does not bruise/bleed easily.  Psychiatric/Behavioral: Negative for depression, suicidal ideas and substance abuse.   Blood pressure 109/53, pulse 72, temperature 98.2 F (36.8 C), temperature source Oral, resp. rate 18, height 5\' 9"  (1.753 m), weight 115.214 kg (254 lb), SpO2 100.00%. Physical Exam  Nursing note and vitals reviewed. Constitutional: She is oriented to person, place, and time. She appears well-developed and well-nourished. No distress.  HENT:  Head: Normocephalic and atraumatic.  Nose: Nose normal.  Mouth/Throat: Oropharynx is clear and moist. No oropharyngeal exudate.  Eyes: EOM are normal. Pupils are equal, round, and reactive to light. No scleral icterus.       Mild pallor noted  Neck: Normal range of motion. Neck supple. No JVD present. No tracheal deviation present. No thyromegaly present.  Cardiovascular: Normal rate, regular rhythm, normal heart sounds and intact distal pulses.  Exam reveals no gallop.   No murmur heard. Respiratory: Effort normal and breath sounds normal. No respiratory distress. She has no wheezes. She has no rales.  GI: Soft. Bowel sounds are normal. She exhibits no distension. There is no tenderness.  Musculoskeletal: Normal range of  motion. She exhibits no edema and no tenderness.  Neurological: She is alert and oriented to person, place, and time. She has normal reflexes. No cranial nerve deficit. Coordination normal.  Skin: Skin is warm and dry. No rash noted. She is not diaphoretic. No erythema. There is pallor.  Psychiatric: She has a normal mood and affect. Her behavior is normal.   EKG reviewed; May and today with delta wave/pre-excitation Labs reviewed; wbc 11.8, h/h 7.4/25.4, plt 607, mcv 60s, na 137, K 3.5, bun/cr 12/0.69, ca 8.8  Problem List Weakness/Fatigue Microcytic Anemia Palpitations + Delta wave/pre-excitation/? Wolfe Parkinson White Obesity Leukocytosis Thrombocytosis  Assessment/Plan: 31 yo woman with PMH of microcytic anemia and prior pre-excitation on EKG in May being admitted with fatigue, weakness and palpitations with significant anemia today. Her symptoms of fatigue and weakness fit best with anemia. She does not have a traditional history of very fast heart rates and lightheadedness associated with bouts of significant tachycardia in WPW or AVNRT. However, her EKG is concerning for pre-excitation. She needs follow-up with EP unless she has any bouts of significant tachycardia in house. 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 For now, monitor on telemetry.  - avoid all AV nodal blockers - if she has a fast bout of tachycardia, obtain STAT EKG, procainamide is drug of choice for WPW - please make sure [name] has a copy of her EKG at time of discharge to accompany her - we will arrange follow-up; please stress importance to [name] - reasonable to obtain Echo as you are planning to evaluate for LV dysfunction, structural abnormalities or presence of high output cardiomyopathy from her anemia  Donavan Kerlin 01/28/2012, 11:59 PM

## 2012-01-28 NOTE — H&P (Addendum)
Amanda Davenport is an 31 y.o. female.   Patient was seen and examined on January 28, 2012. PCP - none. Chief Complaint: Weakness. HPI: 31 year old female with history of chronic anemia, Hiradenitis suppuritiva and abnormal heart rhythm presents to the ER with weakness and exertional symptoms for last 2 weeks. Patient states she has been feeling weak over the last 2 weeks and gets exhausted on exertion. She has been having at times dizziness and also has palpitations. In the ER patient was found to have healed over on 7.4 and her last hemoglobin was around 9 prior to that most of the readings around 7.5. Patient denies any active vaginal or rectal bleeding the she does have wounds around the rectum due to her known history of Hiradenitis suppuritiva. This lesion at times bleeds. Patient on admission was found to be tachycardic and patient does have history of abnormal heart rhythm and was told a few months ago that she needs to follow with a cardiologist. EKG shows features concerning for WPW but at this time heart rate is around 100 per minute. Patient has been admitted for further management.  Past Medical History  Diagnosis Date  . Allergy   . Anemia   . Recurrent boils   . Nearsightedness     wears glasses  . Chronic headache   . Arrhythmia     Past Surgical History  Procedure Date  . Cystectomy     tonsils    Family History  Problem Relation Age of Onset  . Cancer Mother     breast, stomach  . Pulmonary embolism Mother     died of PE  . Diabetes Paternal Grandmother   . Heart disease Neg Hx   . Stroke Neg Hx    Social History:  reports that she has quit smoking. Her smoking use included Cigarettes. She quit after .5 years of use. She does not have any smokeless tobacco history on file. She reports that she does not drink alcohol or use illicit drugs.  Allergies:  Allergies  Allergen Reactions  . Other     All Antibiotics cause severe vaginal yeast infections  . Penicillins  Hives, Itching and Swelling     (Not in a hospital admission)  Results for orders placed during the hospital encounter of 01/28/12 (from the past 48 hour(s))  CBC WITH DIFFERENTIAL     Status: Abnormal   Collection Time   01/28/12  5:57 PM      Component Value Range Comment   WBC 11.8 (*) 4.0 - 10.5 K/uL    RBC 4.00  3.87 - 5.11 MIL/uL    Hemoglobin 7.4 (*) 12.0 - 15.0 g/dL    HCT 16.1 (*) 09.6 - 46.0 %    MCV 63.5 (*) 78.0 - 100.0 fL    MCH 18.5 (*) 26.0 - 34.0 pg    MCHC 29.1 (*) 30.0 - 36.0 g/dL    RDW 04.5 (*) 40.9 - 15.5 %    Platelets 607 (*) 150 - 400 K/uL    Neutrophils Relative 61  43 - 77 %    Lymphocytes Relative 31  12 - 46 %    Monocytes Relative 7  3 - 12 %    Eosinophils Relative 1  0 - 5 %    Basophils Relative 0  0 - 1 %    Neutro Abs 7.2  1.7 - 7.7 K/uL    Lymphs Abs 3.7  0.7 - 4.0 K/uL    Monocytes Absolute 0.8  0.1 - 1.0 K/uL    Eosinophils Absolute 0.1  0.0 - 0.7 K/uL    Basophils Absolute 0.0  0.0 - 0.1 K/uL    RBC Morphology TARGET CELLS     COMPREHENSIVE METABOLIC PANEL     Status: Abnormal   Collection Time   01/28/12  5:57 PM      Component Value Range Comment   Sodium 137  135 - 145 mEq/L    Potassium 3.5  3.5 - 5.1 mEq/L    Chloride 103  96 - 112 mEq/L    CO2 23  19 - 32 mEq/L    Glucose, Bld 80  70 - 99 mg/dL    BUN 12  6 - 23 mg/dL    Creatinine, Ser 1.61  0.50 - 1.10 mg/dL    Calcium 8.8  8.4 - 09.6 mg/dL    Total Protein 9.2 (*) 6.0 - 8.3 g/dL    Albumin 3.1 (*) 3.5 - 5.2 g/dL    AST 10  0 - 37 U/L    ALT <5  0 - 35 U/L REPEATED TO VERIFY   Alkaline Phosphatase 59  39 - 117 U/L    Total Bilirubin 0.1 (*) 0.3 - 1.2 mg/dL    GFR calc non Af Amer >90  >90 mL/min    GFR calc Af Amer >90  >90 mL/min   URINALYSIS, ROUTINE W REFLEX MICROSCOPIC     Status: Abnormal   Collection Time   01/28/12  6:13 PM      Component Value Range Comment   Color, Urine YELLOW  YELLOW    APPearance CLEAR  CLEAR    Specific Gravity, Urine 1.035 (*) 1.005 -  1.030    pH 5.5  5.0 - 8.0    Glucose, UA NEGATIVE  NEGATIVE mg/dL    Hgb urine dipstick SMALL (*) NEGATIVE    Bilirubin Urine NEGATIVE  NEGATIVE    Ketones, ur 15 (*) NEGATIVE mg/dL    Protein, ur NEGATIVE  NEGATIVE mg/dL    Urobilinogen, UA 0.2  0.0 - 1.0 mg/dL    Nitrite NEGATIVE  NEGATIVE    Leukocytes, UA NEGATIVE  NEGATIVE   PREGNANCY, URINE     Status: Normal   Collection Time   01/28/12  6:13 PM      Component Value Range Comment   Preg Test, Ur NEGATIVE  NEGATIVE   URINE MICROSCOPIC-ADD ON     Status: Abnormal   Collection Time   01/28/12  6:13 PM      Component Value Range Comment   Squamous Epithelial / LPF FEW (*) RARE    WBC, UA 0-2  <3 WBC/hpf    RBC / HPF 0-2  <3 RBC/hpf    Bacteria, UA RARE  RARE    Urine-Other MUCOUS PRESENT     OCCULT BLOOD, POC DEVICE     Status: Normal   Collection Time   01/28/12  7:39 PM      Component Value Range Comment   Fecal Occult Bld NEGATIVE     RETICULOCYTES     Status: Normal   Collection Time   01/28/12  7:57 PM      Component Value Range Comment   Retic Ct Pct 1.1  0.4 - 3.1 %    RBC. 3.95  3.87 - 5.11 MIL/uL    Retic Count, Manual 43.5  19.0 - 186.0 K/uL   TYPE AND SCREEN     Status: Normal (Preliminary result)   Collection Time  01/28/12  8:10 PM      Component Value Range Comment   ABO/RH(D) O POS      Antibody Screen PENDING      Sample Expiration 01/31/2012      No results found.  Review of Systems  Constitutional: Positive for malaise/fatigue.  HENT: Negative.   Eyes: Negative.   Respiratory: Positive for shortness of breath.   Cardiovascular: Positive for palpitations.  Gastrointestinal: Negative.   Genitourinary: Negative.   Musculoskeletal: Negative.   Skin: Negative.   Neurological: Positive for weakness.  Endo/Heme/Allergies: Negative.   Psychiatric/Behavioral: Negative.     Blood pressure 114/62, pulse 88, temperature 98.1 F (36.7 C), temperature source Oral, resp. rate 16, SpO2  100.00%. Physical Exam  Constitutional: She is oriented to person, place, and time. She appears well-developed and well-nourished. No distress.  HENT:  Head: Normocephalic and atraumatic.  Right Ear: External ear normal.  Left Ear: External ear normal.  Nose: Nose normal.  Mouth/Throat: Oropharynx is clear and moist.  Eyes: Pupils are equal, round, and reactive to light. Right eye exhibits no discharge. Left eye exhibits no discharge. No scleral icterus.       Pallor.  Neck: Normal range of motion. Neck supple.  Cardiovascular:       Mild sinus tachycardia.  Respiratory: Effort normal and breath sounds normal. No respiratory distress. She has no wheezes. She has no rales.  GI: Soft. Bowel sounds are normal. She exhibits no distension. There is no tenderness. There is no rebound.  Musculoskeletal: Normal range of motion. She exhibits no edema and no tenderness.  Neurological: She is alert and oriented to person, place, and time.       Moves all extremities.  Skin: She is not diaphoretic. There is pallor.     Assessment/Plan #1. Generalized weakness most likely from symptomatic anemia - anemia panel has been ordered. Stool for occult blood has been negative. Patient does take NSAIDs for tendinitis. Which will be discontinued have advised patient not to take his. For pain we will keep patient on when necessary tramadol for now. PRBC transfusion has been already ordered by the ER physician which will be done after anemia panel has been done. Since patient's mother had stomach cancer as per patient, patient will need EGD. Consult GI in a.m. #2. Palpitations - since patient's EKG is concerning for WPW I have consulted cardiologist. Patient will be monitored in telemetry. For the patient's per cardiology. Check thyroid function tests. #3. Hiradenitis suppuritiva - patient does have history of Hiradenitis suppuritiva. Patient was earlier told that she will need surgery. At this time I have placed  patient on doxycycline as her right axilla has active drainage. Will eventually need surgical referral with infectious disease input. #4. Family history of breast cancer and stomach cancer in her mother - I have advised patient to get early breast cancer screening thru her primary care physician as soon as possible. For stomach cancer in mother see #1. #5. Quit tobacco abuse last week.   CODE STATUS - full code.  Eduard Clos 01/28/2012, 8:46 PM

## 2012-01-28 NOTE — ED Provider Notes (Signed)
History     CSN: [id number]  Arrival date & time 01/28/12  1715   First MD Initiated Contact with Patient 01/28/12 1733      Chief Complaint  Patient presents with  . Weakness    (Consider location/radiation/quality/duration/timing/severity/associated sxs/prior treatment) Patient is a 31 y.o. female presenting with general illness. The history is provided by the patient. No language interpreter was used.  Illness  The current episode started more than 2 weeks ago. The onset was gradual. The problem occurs frequently. The problem has been gradually worsening. The problem is moderate. Nothing relieves the symptoms. Nothing aggravates the symptoms. Associated symptoms include abdominal pain (intermittent mild pain and naus) and nausea. Pertinent negatives include no fever, no diarrhea, no vomiting, no congestion, no headaches, no rhinorrhea, no sore throat, no stridor, no neck pain, no cough, no wheezing and no eye discharge. She has been less active. She has been eating and drinking normally. Urine output has been normal. The last void occurred less than 6 hours ago. There were no sick contacts. She has received no recent medical care.    Past Medical History  Diagnosis Date  . Allergy   . Anemia   . Recurrent boils   . Nearsightedness     wears glasses  . Chronic headache   . Arrhythmia     Past Surgical History  Procedure Date  . Cystectomy     tonsils    Family History  Problem Relation Age of Onset  . Cancer Mother     breast, stomach  . Pulmonary embolism Mother     died of PE  . Diabetes Paternal Grandmother   . Heart disease Neg Hx   . Stroke Neg Hx     History  Substance Use Topics  . Smoking status: Former Smoker -- .5 years    Types: Cigarettes  . Smokeless tobacco: Not on file  . Alcohol Use: No    OB History    Grav Para Term Preterm Abortions TAB SAB Ect Mult Living   0               Review of Systems  Constitutional: Positive for fatigue.  Negative for fever, chills, activity change and appetite change.  HENT: Negative for congestion, sore throat, rhinorrhea, neck pain, neck stiffness and sinus pressure.   Eyes: Negative for discharge and visual disturbance.  Respiratory: Positive for shortness of breath (intermittent, exertional). Negative for cough, chest tightness, wheezing and stridor.   Cardiovascular: Positive for chest pain (mild intermittnet twinges) and palpitations. Negative for leg swelling.  Gastrointestinal: Positive for nausea and abdominal pain (intermittent mild pain and naus). Negative for vomiting, diarrhea and abdominal distention.  Genitourinary: Negative for decreased urine volume and difficulty urinating.  Musculoskeletal: Negative for back pain and arthralgias.  Skin: Negative for color change and pallor.  Neurological: Positive for light-headedness. Negative for weakness and headaches.  Psychiatric/Behavioral: Negative for behavioral problems and agitation.  All other systems reviewed and are negative.    Allergies  Other and Penicillins  Home Medications   Current Outpatient Rx  Name Route Sig Dispense Refill  . ASPIRIN 325 MG PO TABS Oral Take 325 mg by mouth once.    Marlin Canary HEADACHE PO Oral Take 1 packet by mouth daily as needed. For headache    . IBUPROFEN 200 MG PO TABS Oral Take 400 mg by mouth every 6 (six) hours as needed. For pain    . NABUMETONE 750 MG PO TABS Oral Take  750 mg by mouth 2 (two) times daily as needed. For pain      BP 114/62  Pulse 88  Temp 98.1 F (36.7 C) (Oral)  Resp 16  SpO2 100%  Physical Exam  Nursing note and vitals reviewed. Constitutional: She is oriented to person, place, and time. She appears well-developed and well-nourished. No distress.  HENT:  Head: Normocephalic and atraumatic.  Mouth/Throat: No oropharyngeal exudate.  Eyes: EOM are normal. Pupils are equal, round, and reactive to light. Right eye exhibits no discharge. Left eye exhibits no  discharge.  Neck: Normal range of motion. Neck supple. No JVD present.  Cardiovascular: Normal rate, regular rhythm and normal heart sounds.   Pulmonary/Chest: Effort normal and breath sounds normal. No stridor. No respiratory distress. She exhibits no tenderness.  Abdominal: Soft. Bowel sounds are normal. She exhibits no distension. There is no tenderness. There is no guarding.  Genitourinary: Guaiac negative stool.  Musculoskeletal: Normal range of motion. She exhibits no edema and no tenderness.  Neurological: She is alert and oriented to person, place, and time. No cranial nerve deficit. She exhibits normal muscle tone.  Skin: Skin is warm and dry. No rash noted. She is not diaphoretic.       Multiple abscesses bilat axilla and glut fold that are pustulent. No cellulitis seen.  Psychiatric: She has a normal mood and affect. Her behavior is normal. Judgment and thought content normal.    ED Course  Procedures (including critical care time)  Labs Reviewed  CBC WITH DIFFERENTIAL - Abnormal; Notable for the following:    WBC 11.8 (*)     Hemoglobin 7.4 (*)     HCT 25.4 (*)     MCV 63.5 (*)     MCH 18.5 (*)     MCHC 29.1 (*)     RDW 18.8 (*)     Platelets 607 (*)     All other components within normal limits  COMPREHENSIVE METABOLIC PANEL - Abnormal; Notable for the following:    Total Protein 9.2 (*)     Albumin 3.1 (*)     Total Bilirubin 0.1 (*)     All other components within normal limits  URINALYSIS, ROUTINE W REFLEX MICROSCOPIC - Abnormal; Notable for the following:    Specific Gravity, Urine 1.035 (*)     Hgb urine dipstick SMALL (*)     Ketones, ur 15 (*)     All other components within normal limits  URINE MICROSCOPIC-ADD ON - Abnormal; Notable for the following:    Squamous Epithelial / LPF FEW (*)     All other components within normal limits  PREGNANCY, URINE  OCCULT BLOOD, POC DEVICE  TYPE AND SCREEN  PREPARE RBC (CROSSMATCH)  VITAMIN B12  FOLATE  IRON AND  TIBC  FERRITIN  RETICULOCYTES   No results found.   1. Microcytic anemia   2. Hidradenitis suppurativa       MDM   Occult blood, poc device (Final result)   Component (Lab Inquiry)      Result Time  Fecal Occult Bld    01/28/12 1935  NEGATIVE         Comprehensive metabolic panel (Final result)  Abnormal  Component (Lab Inquiry)      Result Time  NA  K  CL  CO2  GLUCOSE    01/28/12 1924  137  3.5  103  23  80           Result Time  BUN  Creatinine, Ser  CALCIUM  PROTEIN  Albumin    01/28/12 1924  12  0.69  8.8  9.2 (H)  3.1 (L)           Result Time  AST  ALT  ALK PHOS  BILI TOTL  GFR calc non Af Amer    01/28/12 1924  10  <5 REPEATED TO VERIFY  59  0.1 (L)  >90           Result Time  GFR calc Af Amer    01/28/12 1924  >90 The eGFR has been calculated using the CKD EPI equation. This calculation has not been validated in all clinical situations. eGFR's persistently <90 mL/min signify possible Chronic Kidney Disease.         CBC with Differential (Final result)  Abnormal  Component (Lab Inquiry)      Result Time  WBC  RBC  HGB  HCT  MCV    01/28/12 1919  11.8 (H)  4.00  7.4 (L)  25.4 (L)  63.5 (L)           Result Time  MCH  MCHC  RDW  PLT  NEUTRO PCT    01/28/12 1919  18.5 (L)  29.1 (L)  18.8 (H)  607 (H)  61           Result Time  LYMPHO PCT  MONO PCT  EOS PCT  BASOS PCT  AB NEUTRO    01/28/12 1919  31  7  1   0  7.2           Result Time  AB LYM  MONO ABS  EOSINO ABS  BASOS ABS  RBC Morphology    01/28/12 1919  3.7  0.8  0.1  0.0  TARGET CELLS         Urine microscopic-add on (Final result)  Abnormal  Component (Lab Inquiry)      Result Time  Squamous Epithelial / LPF  WBC U  RBC / HPF  BACTERIA  Urine-Other    01/28/12 1853  FEW (A)  0-2  0-2  RARE  MUCOUS PRESENT         Urinalysis, Routine w reflex microscopic (Final result)  Abnormal  Component (Lab Inquiry)      Result Time  Color, Urine  APPearance  Specific Gravity, Urine  pH   GLUCOSE U    01/28/12 1853  YELLOW  CLEAR  1.035 (H)  5.5  NEGATIVE           Result Time  Hgb urine dipstick  BILI UR  Ketones, ur  Protein, ur  Urobilinogen, UA    01/28/12 1853  SMALL (A)  NEGATIVE  15 (A)  NEGATIVE  0.2           Result Time  Nitrite  LEUKOCYTES    01/28/12 1853  NEGATIVE  NEGATIVE         Pregnancy, urine (Final result)   Component (Lab Inquiry)      Result Time  Preg Test, Ur    01/28/12 1839  NEGATIVE THE SENSITIVITY OF THIS METHODOLOGY IS >24 mIU/mL         P/w vague sxs incl weakness, LH, intermittent SOB and palpitations at times. Noted to be pale by friends today which prompted her to come to ER. Found to be anemic here at 7.4, mild tachycardia. 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 consulted medicine who recommended anemia panel and  admission for further w/u. Source may be GI, does not have heavy periods.  No indication for I&D at this time since her abscesses are chronic, she needs definitive surgery for this.  Admitted to medicien in stable condition        Warrick Parisian, MD 01/28/12 [phone]

## 2012-01-28 NOTE — ED Notes (Signed)
Patient states that she has not been feeling well for the past few weeks.  Patient has been experiencing generalized weakness, blurred vision, abdominal pain and chest started hurting today.  Patient was at work and co-workers notices that she turned pale so she came here to be evaluated

## 2012-01-29 DIAGNOSIS — D509 Iron deficiency anemia, unspecified: Principal | ICD-10-CM

## 2012-01-29 DIAGNOSIS — R58 Hemorrhage, not elsewhere classified: Secondary | ICD-10-CM

## 2012-01-29 LAB — TYPE AND SCREEN
ABO/RH(D): O POS
Unit division: 0

## 2012-01-29 LAB — CBC WITH DIFFERENTIAL/PLATELET
Basophils Absolute: 0 10*3/uL (ref 0.0–0.1)
Eosinophils Relative: 1 % (ref 0–5)
HCT: 27.3 % — ABNORMAL LOW (ref 36.0–46.0)
Hemoglobin: 8.3 g/dL — ABNORMAL LOW (ref 12.0–15.0)
Lymphocytes Relative: 39 % (ref 12–46)
Monocytes Relative: 8 % (ref 3–12)
Neutro Abs: 4.9 10*3/uL (ref 1.7–7.7)
RBC: 4.09 MIL/uL (ref 3.87–5.11)
RDW: 21.9 % — ABNORMAL HIGH (ref 11.5–15.5)
WBC: 9.5 10*3/uL (ref 4.0–10.5)

## 2012-01-29 LAB — IRON AND TIBC
Saturation Ratios: 3 % — ABNORMAL LOW (ref 20–55)
UIBC: 335 ug/dL (ref 125–400)

## 2012-01-29 LAB — T3, FREE: T3, Free: 3 pg/mL (ref 2.3–4.2)

## 2012-01-29 LAB — COMPREHENSIVE METABOLIC PANEL
Alkaline Phosphatase: 52 U/L (ref 39–117)
BUN: 10 mg/dL (ref 6–23)
Calcium: 8.2 mg/dL — ABNORMAL LOW (ref 8.4–10.5)
Creatinine, Ser: 0.64 mg/dL (ref 0.50–1.10)
GFR calc Af Amer: >90 mL/min (ref >90)
Glucose, Bld: 85 mg/dL (ref 70–99)
Total Protein: 7.6 g/dL (ref 6.0–8.3)

## 2012-01-29 LAB — FERRITIN: Ferritin: 4 ng/mL — ABNORMAL LOW (ref 10–291)

## 2012-01-29 LAB — T4, FREE: Free T4: 0.98 ng/dL (ref 0.80–1.80)

## 2012-01-29 MED ORDER — FERROUS GLUCONATE 324 (38 FE) MG PO TABS
324.0000 mg | ORAL_TABLET | Freq: Two times a day (BID) | ORAL | Status: DC
  Administered 2012-01-29 – 2012-01-30 (×3): 324 mg via ORAL
  Filled 2012-01-29 (×4): qty 1

## 2012-01-29 MED ORDER — SODIUM CHLORIDE 0.9 % IV SOLN
1020.0000 mg | Freq: Once | INTRAVENOUS | Status: AC
  Administered 2012-01-29: 1020 mg via INTRAVENOUS
  Filled 2012-01-29: qty 34

## 2012-01-29 NOTE — Progress Notes (Signed)
PATIENT DETAILS Name: Amanda Davenport Age: 31 y.o. Sex: female Date of Birth: 1980/12/05 Admit Date: 01/28/2012 Admitting Physician Eduard Clos, MD ZOX:[id number] Leonette Most, MD  Subjective: Feels better  Assessment/Plan: Principal Problem:  *Anemia -long h/o iron deficiency anemia-non compliant to Fe supplementation-as it caused GI upset or constipation -s/p 2 units of PRBC transfusion -no h/o Menorrhagia-Pelvic USG in 2010 essentially negative -FOBT on admission was negative as well. -not sure what the exact etiology here is-will ask Hematology to assist for further recommendations and w/u if needed  Active Problems:  Hidradenitis suppurativa -chronic issue -has been told she needs surgery in the past -c/w Doxy  Pre-excitation Synd with EKG changes -appreciate cards eval  Disposition: Remain inpatient till Hematology MD see's pt  DVT Prophylaxis: SCD's  Code Status: Full code   Procedures:  Non  CONSULTS:  hematology/oncology  PHYSICAL EXAM: Vital signs in last 24 hours: Filed Vitals:   01/29/12 0329 01/29/12 0650 01/29/12 0654 01/29/12 0655  BP: 106/67 104/69 104/71 106/73  Pulse: 75 76 76 76  Temp: 99.1 F (37.3 C) 98.5 F (36.9 C)    TempSrc: Oral Oral    Resp: 18 16 16 16   Height:      Weight:      SpO2:  100%      Weight change:  Body mass index is 37.51 kg/(m^2).   Gen Exam: Awake and alert with clear speech.   Neck: Supple, No JVD.   Chest: B/L Clear.   CVS: S1 S2 Regular, no murmurs.  Abdomen: soft, BS +, non tender, non distended.  Extremities: no edema, lower extremities warm to touch. Neurologic: Non Focal.   Skin: No Rash.   Wounds: N/A.   Intake/Output from previous day:  Intake/Output Summary (Last 24 hours) at 01/29/12 1345 Last data filed at 01/29/12 0954  Gross per 24 hour  Intake 1184.5 ml  Output      0 ml  Net 1184.5 ml     LAB RESULTS: CBC  Lab 01/29/12 0520 01/28/12 1757  WBC 9.5 11.8*  HGB 8.3*  7.4*  HCT 27.3* 25.4*  PLT 494* 607*  MCV 66.7* 63.5*  MCH 20.3* 18.5*  MCHC 30.4 29.1*  RDW 21.9* 18.8*  LYMPHSABS 3.7 3.7  MONOABS 0.8 0.8  EOSABS 0.1 0.1  BASOSABS 0.0 0.0  BANDABS -- --    Chemistries   Lab 01/29/12 0520 01/28/12 1757  NA 138 137  K 3.5 3.5  CL 107 103  CO2 23 23  GLUCOSE 85 80  BUN 10 12  CREATININE 0.64 0.69  CALCIUM 8.2* 8.8  MG -- --    CBG: No results found for this basename: GLUCAP:5 in the last 168 hours  GFR Estimated Creatinine Clearance: 139.3 ml/min (by C-G formula based on Cr of 0.64).  Coagulation profile No results found for this basename: INR:5,PROTIME:5 in the last 168 hours  Cardiac Enzymes No results found for this basename: CK:3,CKMB:3,TROPONINI:3,MYOGLOBIN:3 in the last 168 hours  No components found with this basename: POCBNP:3 No results found for this basename: DDIMER:2 in the last 72 hours No results found for this basename: HGBA1C:2 in the last 72 hours No results found for this basename: CHOL:2,HDL:2,LDLCALC:2,TRIG:2,CHOLHDL:2,LDLDIRECT:2 in the last 72 hours  Basename 01/29/12 0520  TSH 2.267  T4TOTAL --  T3FREE 3.0  THYROIDAB --    Basename 01/28/12 1957  VITAMINB12 414  FOLATE 7.2  FERRITIN 4*  TIBC 345  IRON 10*  RETICCTPCT 1.1   No results found for  this basename: LIPASE:2,AMYLASE:2 in the last 72 hours  Urine Studies No results found for this basename: UACOL:2,UAPR:2,USPG:2,UPH:2,UTP:2,UGL:2,UKET:2,UBIL:2,UHGB:2,UNIT:2,UROB:2,ULEU:2,UEPI:2,UWBC:2,URBC:2,UBAC:2,CAST:2,CRYS:2,UCOM:2,BILUA:2 in the last 72 hours  MICROBIOLOGY: No results found for this or any previous visit (from the past 240 hour(s)).  RADIOLOGY STUDIES/RESULTS: No results found.  MEDICATIONS: Scheduled Meds:   . sodium chloride   Intravenous STAT  . doxycycline  100 mg Oral Q12H  . pantoprazole (PROTONIX) IV  40 mg Intravenous Q12H  . sodium chloride  1,000 mL Intravenous Once  . sodium chloride  3 mL Intravenous Q12H    Continuous Infusions:   . DISCONTD: sodium chloride 50 mL/hr at 01/29/12 1102   PRN Meds:.acetaminophen, acetaminophen, ondansetron (ZOFRAN) IV, ondansetron, traMADol, DISCONTD: ondansetron (ZOFRAN) IV, DISCONTD: ondansetron (ZOFRAN) IV, DISCONTD: ondansetron  Antibiotics: Anti-infectives     Start     Dose/Rate Route Frequency Ordered Stop   01/28/12 2245   doxycycline (VIBRA-TABS) tablet 100 mg        100 mg Oral Every 12 hours 01/28/12 2230             Jeoffrey Massed, MD  [facility] Pager:[phone]  If 7PM-7AM, please contact night-coverage [email or web address] Password [email or web address] 01/29/2012, 1:45 PM   LOS: 1 day

## 2012-01-29 NOTE — Consult Note (Signed)
HEMATOLOGY ONCOLOGY CONSULTATION   Amanda Davenport  DOB: 1980/04/22  MR#: [id number]  CSN#: [id number]    Requesting Physician: [facility]  Teaching Service   Dr.   Valera Castle MD: Aleen Campi  History of present illness:         31 year old African American woman with known history of iron deficiency anemia non compliant to oral iron regimen, admitted with recurrent palpitations and dizziness, accompanied by shortness of breath and generalized fatigue. These symptoms had intermittently appeared for the last year requiring ER visits, with negative CTs for emboli, but positive anemia workup, not having been followed as an outpatient. Last visit to the ER was in May of 2013, symptomatic and on her period. She was not on oral iron. Her hemoglobin 7.5 and HCT 25.1 microcytic. Her Iron studies at the time showed Fe 10, TIBC 332, Sat N/A and Ferritin 6. Folate N/A. Patient took OTC iron ~ bid x 1 month after visit in May 2013, tolerated poorly with constipation and GI upset; this is the only time that she has taken oral iron.  On this admission,she was noted to have Delta wave pre-excitation suspicious for WPW being evaluated by Cardiology.   Labs reviewed show an H/H of  7.4/ 25.4,MCV 63, platelets 607k, with  WBC at 11.8 (ANC 61). Today, her WBC normalizing at 9.5, H/H 8.3/27.3, MCV 66.7 and platelets 494.  No family history of hematological disorders. Patient had never been evaluated for anemia by a hematologist. Never received IV Iron. Never had a bone marrow biopsy. Of note, she has a history of hidradenitis suppurativa which frequently bleeds for which at this time she has been placed on doxycycline.  She has also noticed intermittent blood in her stools, but FOB is negative. Never had a colonoscopy or EGD. Her mother died with colon cancer at age 74.   Denies large amounts of coffee, starch or iced tea intake, but "eats ice all the time at work".  No heavy or irregular periods; she fills about 3  pads a day and 4-5 tampons during her menses, which began ~ age 68. Her LMP was from 10/10 till 10/15, normal, without clot formation. Takes NSAIDs regularly for tendinitis. Never had a transfusion in the past.  Denies risk factors for HIV or hepatitis. No hemoptysis or epistaxis. Laboratory reports that there is one smear prior to transfusion, which it will be very valuable for review.    Retic Count (absolute) is 43.5 on 01/28/12. During this admission,  Fe levels were 10 ,TIBC  345, percent saturation 3,Ferritin  4,B12  414  And  Folate 7.2   We were kindly requested to see the patient with recommendations.    Past medical history:      Past Medical History  Diagnosis Date  . Allergy   . Anemia   . Recurrent boils   . Nearsightedness     wears glasses  . Chronic headache   . Arrhythmia    Recent history of tobacco abuse  Past surgical history:      Past Surgical History  Procedure Date  . Cystectomy     tonsils    Medications:  Prior to Admission:  Prescriptions prior to admission  Medication Sig Dispense Refill  . aspirin 325 MG tablet Take 325 mg by mouth once.      . Aspirin-Acetaminophen-Caffeine (GOODY HEADACHE PO) Take 1 packet by mouth daily as needed. For headache      . ibuprofen (ADVIL,MOTRIN) 200 MG tablet  Take 400 mg by mouth every 6 (six) hours as needed. For pain      . nabumetone (RELAFEN) 750 MG tablet Take 750 mg by mouth 2 (two) times daily as needed. For pain        JXB:JYNWGNFAOZHYQ, acetaminophen, ondansetron (ZOFRAN) IV, ondansetron, traMADol, DISCONTD: ondansetron (ZOFRAN) IV, DISCONTD: ondansetron (ZOFRAN) IV, DISCONTD: ondansetron  Allergies:  Allergies  Allergen Reactions  . Other     All Antibiotics cause severe vaginal yeast infections  . Penicillins Hives, Itching and Swelling    Family history:     Family History  Problem Relation Age of Onset  . Cancer Mother     breast, stomach  . Pulmonary embolism Mother     died of PE  .  Diabetes Paternal Grandmother   . Heart disease Neg Hx   . Stroke Neg Hx    Mother with breast cancer in 30s, then either second primary colon ca vs metastatic breast cancer (history not clear) and died at age 42.  Maternal aunt with breast cancer Other maternal siblings with lung, brain and throat cancer                                         Social history:       Lives in [name]. 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 Never been pregnant. Has been married for 16 years. Prior  Tobacco. NoETOH or recreational drug history.  Review of systems:  Constitutional: Positive Negative for weight loss. Negative for fever,night sweats, or chills Positive for malaise and fatigue as above,. Eyes:Sensitivity to bright light.No double vision.  Respiratory: Negative for cough or  hemoptysis Negative Positive for  shortness of breath, especially on exertion as above.  Cardiovascular: Intermittent chest pain. Positive for palpitations.  GI: No nausea, vomiting, diarrhea, or constipation. No change in bowel caliber. No  Melena. Intermittent blood in stools noted. No abdominal pain.  MV:HQIONGEX for hematuria. No loss of urinary control. No Urinary retention.  Skin:Bilateral axillary and gluteal hidradenitis suppurativa.. No rash. No petechia. No bruising.  Neurological: No headaches. No motor or sensory deficits.No confusion. Positive for dizziness.    Allergies:     Allergies  Allergen Reactions  . Other     All Antibiotics cause severe vaginal yeast infections  . Penicillins Hives, Itching and Swelling    Medications:      . sodium chloride   Intravenous STAT  . doxycycline  100 mg Oral Q12H  . pantoprazole (PROTONIX) IV  40 mg Intravenous Q12H  . sodium chloride  1,000 mL Intravenous Once  . sodium chloride  3 mL Intravenous Q12H    Physical exam:      Filed Vitals:   01/29/12 0655  BP: 106/73  Pulse: 76  Temp:   Resp: 16     Body mass index is 37.51  kg/(m^2).  ECOG PS:  General:   31 year old AAF in no acute distress A. and O. x3  well-developed and well-nourished. Mild pallor.  HEENT: Normocephalic, atraumatic, PERRLA. Oral cavity without thrush or lesions.Tongue very smooth, mucous membranes pale.Neck supple. no thyromegaly, no cervical or supraclavicular adenopathy  Lungs clear bilaterally . No wheezing, rhonchi or rales. No axillary masses. Breasts: not examined. Cardiac regular rate and rhythm normal S1-S2, no murmur , rubs or gallops Abdomen  Obese,soft nontender , bowel sounds x4. No HSM.  No masses palpable.  GU/rectal: deferred. Extremities no clubbing, no  cyanosis or edema. No bruising or petechial rash. Bilateral axillary abscesses. Palms very pale.Nailbeds pale. Musculoskeletal: no spinal tenderness.  Neuro: Non Focal   Lab results:       CBC  Lab 01/29/12 0520 01/28/12 1757  WBC 9.5 11.8*  HGB 8.3* 7.4*  HCT 27.3* 25.4*  PLT 494* 607*  MCV 66.7* 63.5*  MCH 20.3* 18.5*  MCHC 30.4 29.1*  RDW 21.9* 18.8*  LYMPHSABS 3.7 3.7  MONOABS 0.8 0.8  EOSABS 0.1 0.1  BASOSABS 0.0 0.0  BANDABS -- --    Anemia panel:   Basename 01/28/12 1957  VITAMINB12 414  FOLATE 7.2  FERRITIN 4*  TIBC 345  IRON 10*  RETICCTPCT 1.1     Chemistries   Lab 01/29/12 0520 01/28/12 1757  NA 138 137  K 3.5 3.5  CL 107 103  CO2 23 23  GLUCOSE 85 80  BUN 10 12  CREATININE 0.64 0.69  CALCIUM 8.2* 8.8  MG -- --     Studies:      No results found.  Assessment/Plan:30 y.o. female admitted on 10/24 with symptomatic anemia, having received 2 units of blood with good response. She was noted to have severe Iron deficiency. Smear to be evaluated. May need Iron infusion to improve values and symptoms..Dr. Darrold Span is to see the patient following this consult with recommendations regarding diagnosis, treatment options and further workup studies.  Thank you for the referral.   [facility] E 01/29/2012   Patient seen and examined,  and I have discussed with hospitalist here now. Anemia probably due to severe iron deficiency, with gyn blood loss most likely tho history also of some blood with bowel movements. Suggest IV iron now and I will see if she can tolerate ferrous fumarate or ferrous gluconate to continue po at discharge. I will see her back at [facility] in next few weeks to follow up. Agree with GI consult, and she may also be appropriate for genetics counseling outpatient thru [facility] (not discussed now).   Thank you Jama Flavors, MD [phone]

## 2012-01-29 NOTE — Care Management Note (Unsigned)
    Page 1 of 1   01/29/2012     12:31:23 PM   CARE MANAGEMENT NOTE 01/29/2012  Patient:  Amanda Davenport, Amanda Davenport   Account Number:  [id number]  Date Initiated:  01/29/2012  Documentation initiated by:  Letha Cape  Subjective/Objective Assessment:   dx anemia  admit as observatin- lives with a friend.  pta independent.     Action/Plan:   Anticipated DC Date:  01/29/2012   Anticipated DC Plan:  HOME/SELF CARE      DC Planning Services  CM consult      Choice offered to / List presented to:             Status of service:  In process, will continue to follow Medicare Important Message given?   (If response is "NO", the following Medicare IM given date fields will be blank) Date Medicare IM given:   Date Additional Medicare IM given:    Discharge Disposition:  HOME/SELF CARE  Per UR Regulation:  Reviewed for med. necessity/level of care/duration of stay  If discussed at Long Length of Stay Meetings, dates discussed:    Comments:  01/29/12 12:29 Letha Cape RN, BSN [phone] patient lives with a friend, pta independent.  Patient states she can call someone for transportation,  Patient is eligible for med ast , she states she will need med ast. NCM will continue to follow for dc needs.

## 2012-01-29 NOTE — Progress Notes (Signed)
Patient ID: Amanda Davenport, female   DOB: 09-Sep-1980, 31 y.o.   MRN: [id number] Subjective:  Still feels fatigued  Objective:  Vital Signs in the last 24 hours: Temp:  [98.1 F (36.7 C)-99.1 F (37.3 C)] 98.7 F (37.1 C) (10/25 1510) Pulse Rate:  [71-95] 86  (10/25 1510) Resp:  [16-32] 16  (10/25 1510) BP: (102-122)/(53-73) 112/61 mmHg (10/25 1510) SpO2:  [98 %-100 %] 100 % (10/25 1510) Weight:  [254 lb (115.214 kg)] 254 lb (115.214 kg) (10/24 2235)  Intake/Output from previous day: 10/24 0701 - 10/25 0700 In: 282 [Blood:282] Out: -  Intake/Output from this shift: Total I/O In: 1142.5 [P.O.:480; I.V.:662.5] Out: -   Physical Exam: Well appearing NAD HEENT: Unremarkable Neck:  No JVD, no thyromegally Lungs:  Clear with no wheezes HEART:  Regular rate rhythm, no murmurs, no rubs, no clicks Abd:  Flat, positive bowel sounds, no organomegally, no rebound, no guarding Ext:  2 plus pulses, no edema, no cyanosis, no clubbing Skin:  No rashes no nodules Neuro:  CN II through XII intact, motor grossly intact  Lab Results:  Basename 01/29/12 0520 01/28/12 1757  WBC 9.5 11.8*  HGB 8.3* 7.4*  PLT 494* 607*    Basename 01/29/12 0520 01/28/12 1757  NA 138 137  K 3.5 3.5  CL 107 103  CO2 23 23  GLUCOSE 85 80  BUN 10 12  CREATININE 0.64 0.69   No results found for this basename: TROPONINI:2,CK,MB:2 in the last 72 hours Hepatic Function Panel  Basename 01/29/12 0520  PROT 7.6  ALBUMIN 2.5*  AST 10  ALT <5  ALKPHOS 52  BILITOT 0.3  BILIDIR --  IBILI --   No results found for this basename: CHOL in the last 72 hours No results found for this basename: PROTIME in the last 72 hours  Imaging: No results found.  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 I would like to see her in my office in the next 3-4 weeks. No formal cardiac recs  except tele until she is ready for discharge. Cadell Gabrielson,M.D. 01/29/2012, 6:11 PM

## 2012-01-29 NOTE — ED Provider Notes (Signed)
I have supervised the resident on the management of this patient and agree with the note above. I personally interviewed and examined the patient and my addendum is below.   Amanda Davenport is a 31 y.o. female hx of anemia here with generalized weakness. She had SOB and weakness at work and was found to be pale by friends. She had nl periods and denies melena or vomiting. Her stool occult was negative. Her hemoglobin was 7.4 and she was transfused with 1 unit PRBC. 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 She is admitted for symptomatic anemia.    Richardean Canal, MD 01/29/12 [phone]

## 2012-01-29 NOTE — Consult Note (Signed)
Reason for Consult: IDA and FH of colon cancer Referring Physician: [facility]  Amanda Davenport HPI: This is a 31 year old female with menorrhagia and resultant IDA.  She has a history of being noncompliant with her iron supplementation.  She presented to the hospital with complaints of dizziness, SOB, and fatigue.  Since 2010 she has been noted to have a decline in her HGB and iron status.  Additionally there was the thought that she may have WPW.  There is a history of colon cancer in her mother at the age of 53.  Past Medical History  Diagnosis Date  . Allergy   . Anemia   . Recurrent boils   . Nearsightedness     wears glasses  . Chronic headache   . Arrhythmia     Past Surgical History  Procedure Date  . Cystectomy     tonsils    Family History  Problem Relation Age of Onset  . Cancer Mother     breast, stomach  . Pulmonary embolism Mother     died of PE  . Diabetes Paternal Grandmother   . Heart disease Neg Hx   . Stroke Neg Hx     Social History:  reports that she has quit smoking. Her smoking use included Cigarettes. She quit after .5 years of use. She does not have any smokeless tobacco history on file. She reports that she does not drink alcohol or use illicit drugs.  Allergies:  Allergies  Allergen Reactions  . Other     All Antibiotics cause severe vaginal yeast infections  . Penicillins Hives, Itching and Swelling    Medications:  Scheduled:   . sodium chloride   Intravenous STAT  . doxycycline  100 mg Oral Q12H  . ferrous gluconate  324 mg Oral BID  . ferumoxytol  1,020 mg Intravenous Once  . pantoprazole (PROTONIX) IV  40 mg Intravenous Q12H  . sodium chloride  1,000 mL Intravenous Once  . sodium chloride  3 mL Intravenous Q12H   Continuous:   . DISCONTD: sodium chloride 50 mL/hr at 01/29/12 1102    Results for orders placed during the hospital encounter of 01/28/12 (from the past 24 hour(s))  CBC WITH DIFFERENTIAL     Status:  Abnormal   Collection Time   01/28/12  5:57 PM      Component Value Range   WBC 11.8 (*) 4.0 - 10.5 K/uL   RBC 4.00  3.87 - 5.11 MIL/uL   Hemoglobin 7.4 (*) 12.0 - 15.0 g/dL   HCT 29.5 (*) 62.1 - 30.8 %   MCV 63.5 (*) 78.0 - 100.0 fL   MCH 18.5 (*) 26.0 - 34.0 pg   MCHC 29.1 (*) 30.0 - 36.0 g/dL   RDW 65.7 (*) 84.6 - 96.2 %   Platelets 607 (*) 150 - 400 K/uL   Neutrophils Relative 61  43 - 77 %   Lymphocytes Relative 31  12 - 46 %   Monocytes Relative 7  3 - 12 %   Eosinophils Relative 1  0 - 5 %   Basophils Relative 0  0 - 1 %   Neutro Abs 7.2  1.7 - 7.7 K/uL   Lymphs Abs 3.7  0.7 - 4.0 K/uL   Monocytes Absolute 0.8  0.1 - 1.0 K/uL   Eosinophils Absolute 0.1  0.0 - 0.7 K/uL   Basophils Absolute 0.0  0.0 - 0.1 K/uL   RBC Morphology TARGET CELLS    COMPREHENSIVE  METABOLIC PANEL     Status: Abnormal   Collection Time   01/28/12  5:57 PM      Component Value Range   Sodium 137  135 - 145 mEq/L   Potassium 3.5  3.5 - 5.1 mEq/L   Chloride 103  96 - 112 mEq/L   CO2 23  19 - 32 mEq/L   Glucose, Bld 80  70 - 99 mg/dL   BUN 12  6 - 23 mg/dL   Creatinine, Ser 9.81  0.50 - 1.10 mg/dL   Calcium 8.8  8.4 - 19.1 mg/dL   Total Protein 9.2 (*) 6.0 - 8.3 g/dL   Albumin 3.1 (*) 3.5 - 5.2 g/dL   AST 10  0 - 37 U/L   ALT <5  0 - 35 U/L   Alkaline Phosphatase 59  39 - 117 U/L   Total Bilirubin 0.1 (*) 0.3 - 1.2 mg/dL   GFR calc non Af Amer >90  >90 mL/min   GFR calc Af Amer >90  >90 mL/min  URINALYSIS, ROUTINE W REFLEX MICROSCOPIC     Status: Abnormal   Collection Time   01/28/12  6:13 PM      Component Value Range   Color, Urine YELLOW  YELLOW   APPearance CLEAR  CLEAR   Specific Gravity, Urine 1.035 (*) 1.005 - 1.030   pH 5.5  5.0 - 8.0   Glucose, UA NEGATIVE  NEGATIVE mg/dL   Hgb urine dipstick SMALL (*) NEGATIVE   Bilirubin Urine NEGATIVE  NEGATIVE   Ketones, ur 15 (*) NEGATIVE mg/dL   Protein, ur NEGATIVE  NEGATIVE mg/dL   Urobilinogen, UA 0.2  0.0 - 1.0 mg/dL   Nitrite  NEGATIVE  NEGATIVE   Leukocytes, UA NEGATIVE  NEGATIVE  PREGNANCY, URINE     Status: Normal   Collection Time   01/28/12  6:13 PM      Component Value Range   Preg Test, Ur NEGATIVE  NEGATIVE  URINE MICROSCOPIC-ADD ON     Status: Abnormal   Collection Time   01/28/12  6:13 PM      Component Value Range   Squamous Epithelial / LPF FEW (*) RARE   WBC, UA 0-2  <3 WBC/hpf   RBC / HPF 0-2  <3 RBC/hpf   Bacteria, UA RARE  RARE   Urine-Other MUCOUS PRESENT    OCCULT BLOOD, POC DEVICE     Status: Normal   Collection Time   01/28/12  7:39 PM      Component Value Range   Fecal Occult Bld NEGATIVE    VITAMIN B12     Status: Normal   Collection Time   01/28/12  7:57 PM      Component Value Range   Vitamin B-12 414  211 - 911 pg/mL  FOLATE     Status: Normal   Collection Time   01/28/12  7:57 PM      Component Value Range   Folate 7.2    IRON AND TIBC     Status: Abnormal   Collection Time   01/28/12  7:57 PM      Component Value Range   Iron 10 (*) 42 - 135 ug/dL   TIBC 478  295 - 621 ug/dL   Saturation Ratios 3 (*) 20 - 55 %   UIBC 335  125 - 400 ug/dL  FERRITIN     Status: Abnormal   Collection Time   01/28/12  7:57 PM      Component  Value Range   Ferritin 4 (*) 10 - 291 ng/mL  RETICULOCYTES     Status: Normal   Collection Time   01/28/12  7:57 PM      Component Value Range   Retic Ct Pct 1.1  0.4 - 3.1 %   RBC. 3.95  3.87 - 5.11 MIL/uL   Retic Count, Manual 43.5  19.0 - 186.0 K/uL  PREPARE RBC (CROSSMATCH)     Status: Normal   Collection Time   01/28/12  8:00 PM      Component Value Range   Order Confirmation ORDER PROCESSED BY BLOOD BANK    TYPE AND SCREEN     Status: Normal   Collection Time   01/28/12  8:10 PM      Component Value Range   ABO/RH(D) O POS     Antibody Screen NEG     Sample Expiration 01/31/2012     Unit Number [id number]     Blood Component Type RBC LR PHER1     Unit division 00     Status of Unit ISSUED,FINAL     Transfusion Status OK  TO TRANSFUSE     Crossmatch Result Compatible     Unit Number [id number]     Blood Component Type RED CELLS,LR     Unit division 00     Status of Unit ISSUED,FINAL     Transfusion Status OK TO TRANSFUSE     Crossmatch Result Compatible    ABO/RH     Status: Normal   Collection Time   01/28/12  8:10 PM      Component Value Range   ABO/RH(D) O POS    COMPREHENSIVE METABOLIC PANEL     Status: Abnormal   Collection Time   01/29/12  5:20 AM      Component Value Range   Sodium 138  135 - 145 mEq/L   Potassium 3.5  3.5 - 5.1 mEq/L   Chloride 107  96 - 112 mEq/L   CO2 23  19 - 32 mEq/L   Glucose, Bld 85  70 - 99 mg/dL   BUN 10  6 - 23 mg/dL   Creatinine, Ser 9.62  0.50 - 1.10 mg/dL   Calcium 8.2 (*) 8.4 - 10.5 mg/dL   Total Protein 7.6  6.0 - 8.3 g/dL   Albumin 2.5 (*) 3.5 - 5.2 g/dL   AST 10  0 - 37 U/L   ALT <5  0 - 35 U/L   Alkaline Phosphatase 52  39 - 117 U/L   Total Bilirubin 0.3  0.3 - 1.2 mg/dL   GFR calc non Af Amer >90  >90 mL/min   GFR calc Af Amer >90  >90 mL/min  CBC WITH DIFFERENTIAL     Status: Abnormal   Collection Time   01/29/12  5:20 AM      Component Value Range   WBC 9.5  4.0 - 10.5 K/uL   RBC 4.09  3.87 - 5.11 MIL/uL   Hemoglobin 8.3 (*) 12.0 - 15.0 g/dL   HCT 95.2 (*) 84.1 - 32.4 %   MCV 66.7 (*) 78.0 - 100.0 fL   MCH 20.3 (*) 26.0 - 34.0 pg   MCHC 30.4  30.0 - 36.0 g/dL   RDW 40.1 (*) 02.7 - 25.3 %   Platelets 494 (*) 150 - 400 K/uL   Neutrophils Relative 52  43 - 77 %   Lymphocytes Relative 39  12 - 46 %   Monocytes Relative 8  3 - 12 %   Eosinophils Relative 1  0 - 5 %   Basophils Relative 0  0 - 1 %   Neutro Abs 4.9  1.7 - 7.7 K/uL   Lymphs Abs 3.7  0.7 - 4.0 K/uL   Monocytes Absolute 0.8  0.1 - 1.0 K/uL   Eosinophils Absolute 0.1  0.0 - 0.7 K/uL   Basophils Absolute 0.0  0.0 - 0.1 K/uL   RBC Morphology POLYCHROMASIA PRESENT    TSH     Status: Normal   Collection Time   01/29/12  5:20 AM      Component Value Range   TSH 2.267  0.350 -  4.500 uIU/mL  T4, FREE     Status: Normal   Collection Time   01/29/12  5:20 AM      Component Value Range   Free T4 0.98  0.80 - 1.80 ng/dL  T3, FREE     Status: Normal   Collection Time   01/29/12  5:20 AM      Component Value Range   T3, Free 3.0  2.3 - 4.2 pg/mL     No results found.  ROS:  As stated above in the HPI otherwise negative.  Blood pressure 112/61, pulse 86, temperature 98.7 F (37.1 C), temperature source Oral, resp. rate 16, height 5\' 9"  (1.753 m), weight 115.214 kg (254 lb), SpO2 100.00%.    PE: Gen: NAD, Alert and Oriented HEENT:  [place]/AT, EOMI Neck: Supple, no LAD Lungs: CTA Bilaterally CV: RRR without M/G/R ABM: Soft, NTND, +BS Ext: No C/C/E  Assessment/Plan: 1) Severe IDA. 2) Menorrhagia. 3) FH of colon cancer.   The patient requires a colonoscopy with her family history of colon cancer.  I doubt that she has GI source for her IDA as she is heme negative and this issue has been ongoing for some time.  There is no rush for performing the procedure and it can be performed as an outpatient.   Plan: 1) Follow up in the office in 2-4 weeks. 2) I will pursue an outpatient colonoscopy provided the patient follows up as scheduled.  Kriss Perleberg D 01/29/2012, 4:05 PM

## 2012-01-30 MED ORDER — SENNA 8.6 MG PO TABS
2.0000 | ORAL_TABLET | Freq: Every day | ORAL | Status: DC
  Filled 2012-01-30: qty 2

## 2012-01-30 MED ORDER — DOXYCYCLINE HYCLATE 100 MG PO TABS: 100.0000 mg | ORAL_TABLET | Freq: Two times a day (BID) | ORAL | Status: DC

## 2012-01-30 MED ORDER — OMEPRAZOLE 20 MG PO CPDR: 20.0000 mg | DELAYED_RELEASE_CAPSULE | Freq: Every day | ORAL | Status: DC

## 2012-01-30 MED ORDER — FERROUS GLUCONATE 324 (38 FE) MG PO TABS: 324.0000 mg | ORAL_TABLET | Freq: Two times a day (BID) | ORAL | Status: DC

## 2012-01-30 MED ORDER — SENNA 8.6 MG PO TABS: 2.0000 | ORAL_TABLET | Freq: Every day | ORAL | Status: DC

## 2012-01-30 NOTE — Discharge Summary (Signed)
PATIENT DETAILS Name: Amanda Davenport Age: 31 y.o. Sex: female Date of Birth: February 11, 1981 MRN: [id number]. Admit Date: 01/28/2012 Admitting Physician: Eduard Clos, MD [id number],HQIO Leonette Most, MD  Recommendations for Outpatient Follow-up:  1. Needs Hb/Hct monitored atleast monthly 2. Needs referral to GI, Hem and Cardiology-as noted below  PRIMARY DISCHARGE DIAGNOSIS:  Principal Problem:  *Anemia Active Problems:  Hidradenitis suppurativa  Tachycardia      PAST MEDICAL HISTORY: Past Medical History  Diagnosis Date  . Allergy   . Anemia   . Recurrent boils   . Nearsightedness     wears glasses  . Chronic headache   . Arrhythmia     DISCHARGE MEDICATIONS:   Medication List     As of 01/30/2012  9:38 AM    STOP taking these medications         aspirin 325 MG tablet      GOODY HEADACHE PO      nabumetone 750 MG tablet   Commonly known as: RELAFEN      TAKE these medications         doxycycline 100 MG tablet   Commonly known as: VIBRA-TABS   Take 1 tablet (100 mg total) by mouth every 12 (twelve) hours.      ferrous gluconate 324 MG tablet   Commonly known as: FERGON   Take 1 tablet (324 mg total) by mouth 2 (two) times daily.      ibuprofen 200 MG tablet   Commonly known as: ADVIL,MOTRIN   Take 400 mg by mouth every 6 (six) hours as needed. For pain      omeprazole 20 MG capsule   Commonly known as: PRILOSEC   Take 1 capsule (20 mg total) by mouth daily.      senna 8.6 MG Tabs   Commonly known as: SENOKOT   Take 2 tablets (17.2 mg total) by mouth at bedtime.         BRIEF HPI:  See H&P, Labs, Consult and Test reports for all details in brief, 31 year old female with history of chronic anemia, Hiradenitis suppuritiva and abnormal heart rhythm presents to the ER with weakness and exertional symptoms for last 2 weeks. Patient states she has been feeling weak over the last 2 weeks and gets exhausted on exertion. She has been having at times  dizziness and also has palpitations. In the ER patient was found to have healed over on 7.4 and her last hemoglobin was around 9  CONSULTATIONS:  Cardiology GI Hem-Onc  PERTINENT RADIOLOGIC STUDIES: No results found.   PERTINENT LAB RESULTS: CBC:  Basename 01/29/12 0520 01/28/12 1757  WBC 9.5 11.8*  HGB 8.3* 7.4*  HCT 27.3* 25.4*  PLT 494* 607*   CMET CMP     Component Value Date/Time   NA 138 01/29/2012 0520   K 3.5 01/29/2012 0520   CL 107 01/29/2012 0520   CO2 23 01/29/2012 0520   GLUCOSE 85 01/29/2012 0520   BUN 10 01/29/2012 0520   CREATININE 0.64 01/29/2012 0520   CREATININE 0.65 08/18/2011 1159   CALCIUM 8.2* 01/29/2012 0520   PROT 7.6 01/29/2012 0520   ALBUMIN 2.5* 01/29/2012 0520   AST 10 01/29/2012 0520   ALT <5 01/29/2012 0520   ALKPHOS 52 01/29/2012 0520   BILITOT 0.3 01/29/2012 0520   GFRNONAA >90 01/29/2012 0520   GFRAA >90 01/29/2012 0520    GFR Estimated Creatinine Clearance: 139.3 ml/min (by C-G formula based on Cr of 0.64). No results found for this basename:  LIPASE:2,AMYLASE:2 in the last 72 hours No results found for this basename: CKTOTAL:3,CKMB:3,CKMBINDEX:3,TROPONINI:3 in the last 72 hours No components found with this basename: POCBNP:3 No results found for this basename: DDIMER:2 in the last 72 hours No results found for this basename: HGBA1C:2 in the last 72 hours No results found for this basename: CHOL:2,HDL:2,LDLCALC:2,TRIG:2,CHOLHDL:2,LDLDIRECT:2 in the last 72 hours  Basename 01/29/12 0520  TSH 2.267  T4TOTAL --  T3FREE 3.0  THYROIDAB --    Basename 01/28/12 1957  VITAMINB12 414  FOLATE 7.2  FERRITIN 4*  TIBC 345  IRON 10*  RETICCTPCT 1.1   Coags: No results found for this basename: PT:2,INR:2 in the last 72 hours Microbiology: No results found for this or any previous visit (from the past 240 hour(s)).   BRIEF HOSPITAL COURSE:   Principal Problem: Anemia  -long h/o iron deficiency anemia-non compliant to Fe  supplementation-as it caused GI upset or constipation  -s/p 2 units of PRBC transfusion -last Hb 8.3 on 10/25 -no h/o Menorrhagia-Pelvic USG in 2010 essentially negative  -FOBT on admission was negative as well. -Seen by GI-Dr Cira Servant recommended outpatient work up, patient will be provided Dr Haywood Pao office telephone number, she has been instructed to call the office on Monday-for a follow up apointment, to schedule EGD/Colonoscopy. -Have also asked to follow up with Dr Darrold Span at the cancer center -In the past she has been non compliant with Fe supplementation 2/2 to GI upset and constipation, she did receive IV Iron here in the hospital, she will be discharged home on oral Iron, we will provide her with PPI and senokot so that it becomes more tolerable.  Active Problems:  Hidradenitis suppurativa -chronic issue -c/w Doxycycline on discharge  Pre-excitation Synd with EKG changes -seen by Dr Taylor-EPS-needs to follow up with him as outpatient. She will be provided a number for his office, to call on Monday to make an appointment  TODAY-DAY OF DISCHARGE:  Subjective:   Tawni Carnes today has no headache,no chest abdominal pain,no new weakness tingling or numbness, feels much better wants to go home today.   Objective:   Blood pressure 107/69, pulse 81, temperature 98.3 F (36.8 C), temperature source Oral, resp. rate 16, height 5\' 9"  (1.753 m), weight 115.214 kg (254 lb), SpO2 100.00%.  Intake/Output Summary (Last 24 hours) at 01/30/12 0938 Last data filed at 01/29/12 1613  Gross per 24 hour  Intake    614 ml  Output      0 ml  Net    614 ml    Exam Awake Alert, Oriented *3, No new F.N deficits, Normal affect [place].AT,PERRAL Supple Neck,No JVD, No cervical lymphadenopathy appriciated.  Symmetrical Chest wall movement, Good air movement bilaterally, CTAB RRR,No Gallops,Rubs or new Murmurs, No Parasternal Heave +ve B.Sounds, Abd Soft, Non tender, No organomegaly appriciated, No  rebound -guarding or rigidity. No Cyanosis, Clubbing or edema, No new Rash or bruise  DISCHARGE CONDITION: Stable  DISPOSITION: HOME  DISCHARGE INSTRUCTIONS:    Activity:  As tolerated   Diet recommendation: Regular Diet      Follow-up Information    Follow up with [facility], MD. On 03/01/2012. (3:30, bring id and proof of income, initial visit is $50)    Contact information:   [address] [place] [address] [phone]       Follow up with Theda Belfast, MD. Schedule an appointment as soon as possible for a visit in 2 weeks.   Contact information:   [address],  [place] [address] [phone]       Follow up with Lewayne Bunting, MD. Schedule an appointment as soon as possible for a visit in 2 weeks.   Contact information:   [address] [place] [address] [phone]       Follow up with Reece Packer, MD. Schedule an appointment as soon as possible for a visit in 4 weeks.   Contact information:   [address] [place] [address] [phone]         Total Time spent on discharge equals 45 minutes.  SignedJeoffrey Massed 01/30/2012 9:38 AM

## 2012-01-30 NOTE — Progress Notes (Signed)
01/30/12 Patient to be discharged home today. IV site removed, discharge instructions reviewed with patient.

## 2012-01-30 NOTE — Progress Notes (Signed)
   CARE MANAGEMENT NOTE 01/30/2012  Patient:  Amanda Davenport, Amanda Davenport   Account Number:  [id number]  Date Initiated:  01/29/2012  Documentation initiated by:  Letha Cape  Subjective/Objective Assessment:   dx anemia  admit as observatin- lives with a friend.  pta independent.     Action/Plan:   Anticipated DC Date:  01/29/2012   Anticipated DC Plan:  HOME/SELF CARE      DC Planning Services  CM consult  Medication Assistance      Choice offered to / List presented to:             Status of service:  Completed, signed off Medicare Important Message given?   (If response is "NO", the following Medicare IM given date fields will be blank) Date Medicare IM given:   Date Additional Medicare IM given:    Discharge Disposition:  HOME/SELF CARE  Per UR Regulation:  Reviewed for med. necessity/level of care/duration of stay  If discussed at Long Length of Stay Meetings, dates discussed:    Comments:  01/30/2012 1200 NCM sent 3 day Rx and abx Rx to main pharmacy to be filled prior to d/c home. Explained to pt that [employer] med fund can be used only once per year. She wants to use the fund for her meds. Unable to afford the Doxycycline at [facility] for $38.00. Provided pt with list of Primary Care Resources that will accept self pay pt in the community. Provided pt with community Rx discount card that may assist with out of pocket meds. Isidoro Donning RN CCM Case Mgmt phone [phone]  01/29/12 12:29 Letha Cape RN, BSN [phone] patient lives with a friend, pta independent.  Patient states she can call someone for transportation,  Patient is eligible for med ast , she states she will need med ast. NCM will continue to follow for dc needs.

## 2012-02-03 ENCOUNTER — Telehealth: Payer: Self-pay | Admitting: Oncology

## 2012-02-03 NOTE — Telephone Encounter (Signed)
LVOM for pt to return call.  °

## 2012-02-04 ENCOUNTER — Telehealth: Payer: Self-pay | Admitting: Oncology

## 2012-02-04 NOTE — Telephone Encounter (Signed)
LVOM for pt to return call.  °

## 2012-02-08 ENCOUNTER — Telehealth: Payer: Self-pay | Admitting: Oncology

## 2012-02-08 NOTE — Telephone Encounter (Signed)
C/D 02/08/12 for appt.02/16/12

## 2012-02-08 NOTE — Telephone Encounter (Signed)
S/W pt in re [facility] F/U appt 11/12 @ 9:30 w/ Dr. Darrold Span Welcome packet mailed.

## 2012-02-12 ENCOUNTER — Emergency Department (HOSPITAL_COMMUNITY)
Admission: EM | Admit: 2012-02-12 | Discharge: 2012-02-12 | Discharge status: Against Medical Advice | Payer: Self-pay | Attending: Emergency Medicine | Admitting: Emergency Medicine

## 2012-02-12 DIAGNOSIS — L272 Dermatitis due to ingested food: Secondary | ICD-10-CM | POA: Insufficient documentation

## 2012-02-12 NOTE — ED Notes (Signed)
Pt states she thinks she is having allergic reaction to shrimp states  sometinmes she can eat it and all it gives her is scratchy throat. Now having bumps and swelling pt is breathing well no diff swollowing states ate it around 930 this am. Pt warned that she could have severe reaction anytime and to NOT eat shrimp ever agagin and that she could die

## 2012-02-12 NOTE — ED Notes (Signed)
Patient called 3x for vital signs and no answer.

## 2012-02-15 ENCOUNTER — Other Ambulatory Visit: Payer: Self-pay | Admitting: Oncology

## 2012-02-15 DIAGNOSIS — D509 Iron deficiency anemia, unspecified: Secondary | ICD-10-CM

## 2012-02-16 ENCOUNTER — Ambulatory Visit: Payer: Self-pay

## 2012-02-16 ENCOUNTER — Encounter: Payer: Self-pay | Admitting: Oncology

## 2012-02-16 ENCOUNTER — Other Ambulatory Visit: Payer: Self-pay | Admitting: *Deleted

## 2012-02-16 ENCOUNTER — Ambulatory Visit (HOSPITAL_BASED_OUTPATIENT_CLINIC_OR_DEPARTMENT_OTHER): Payer: PRIVATE HEALTH INSURANCE | Admitting: Oncology

## 2012-02-16 ENCOUNTER — Other Ambulatory Visit (HOSPITAL_BASED_OUTPATIENT_CLINIC_OR_DEPARTMENT_OTHER): Payer: PRIVATE HEALTH INSURANCE | Admitting: Lab

## 2012-02-16 VITALS — BP 108/74 | HR 82 | Temp 98.8°F | Ht 70.0 in | Wt 259.5 lb

## 2012-02-16 DIAGNOSIS — Z800 Family history of malignant neoplasm of digestive organs: Secondary | ICD-10-CM

## 2012-02-16 DIAGNOSIS — D500 Iron deficiency anemia secondary to blood loss (chronic): Secondary | ICD-10-CM

## 2012-02-16 DIAGNOSIS — Z803 Family history of malignant neoplasm of breast: Secondary | ICD-10-CM

## 2012-02-16 DIAGNOSIS — D509 Iron deficiency anemia, unspecified: Secondary | ICD-10-CM

## 2012-02-16 DIAGNOSIS — D649 Anemia, unspecified: Secondary | ICD-10-CM

## 2012-02-16 LAB — CBC WITH DIFFERENTIAL/PLATELET
BASO%: 0.8 % (ref 0.0–2.0)
Basophils Absolute: 0.1 10*3/uL (ref 0.0–0.1)
HCT: 32.6 % — ABNORMAL LOW (ref 34.8–46.6)
LYMPH%: 22.4 % (ref 14.0–49.7)
MCH: 23.3 pg — ABNORMAL LOW (ref 25.1–34.0)
MCHC: 31.5 g/dL (ref 31.5–36.0)
MONO#: 0.5 10*3/uL (ref 0.1–0.9)
NEUT%: 68.9 % (ref 38.4–76.8)
Platelets: 381 10*3/uL (ref 145–400)

## 2012-02-16 MED ORDER — DOXYCYCLINE HYCLATE 100 MG PO TABS: 100.0000 mg | ORAL_TABLET | Freq: Two times a day (BID) | ORAL | Status: DC

## 2012-02-16 NOTE — Patient Instructions (Signed)
Continue oral iron until Dr Darrold Span sees you back, each dose on empty stomach with OJ.

## 2012-02-16 NOTE — Telephone Encounter (Signed)
gv and printed pt appt schedule for March 2014 for lab and est

## 2012-02-16 NOTE — Progress Notes (Signed)
Checked in new patient. No financial issues. °

## 2012-02-16 NOTE — Progress Notes (Signed)
OFFICE PROGRESS NOTE   02/16/2012   Physicians: Sharrell Ku MD, Jeani Hawking MD,   She does not identify any PCP now, does not recall Dr Susann Givens (listed in EMR)  INTERVAL HISTORY:  Patient is seen, alone for visit, in follow up of hospital consultation for iron deficiency anemia, and also with significant family history of early breast and colon cancer. She had full dose feraheme in hospital 01-30-12 and continues oral ferrous gluconate daily.  Patient had iron deficiency anemia documented previously, but had not tolerated OTC ferrous sulfate with the earlier diagnosis. She presented to [facility] ED 01-28-12 with weakness, with hemoglobin 7.4 and MCV 63.5; she also had evidence of WPW. She was transfused one unit of PRBCs on 01-28-12 and received feraheme 1020 mg on 01-30-12. She has continued ferrous gluconate 324 mg, tho she has been using these only once daily as she had difficulty tolerating bid "while on doxycycline"; I have reminded her to take the iron on empty stomach with OJ, which she has not done so far. Only bleeding recently has been normal menstrual period in past week, which is not typically heavy.  Cardiology followed in hospital and has seen her within the past week, with additional blood work done, tho I do not see that in Epic EMR. They have recommended ablation procedure for the WPW. Patient is aware of the tachycardia usually once every 1-2 days, but does not think she is more symptomatic from this than previously. She was also seen in hospital by gastroenterology because of reported rectal blood, also family history of colon cancer in mother who died from this at 49. Dr Elnoria Howard planned outpatient follow up to include colonoscopy.  Stool was hemoccult negative x1 in hospital.  Patient felt less weak by 4-5 days after the feraheme and PRBCs, and has continued to feel gradually better since then. She denies SOB walking length of office halls today, tho she still has difficulty climbing  stairs. She has been back at work since shortly after DC from hospital. 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 She has had pain plantar surface left foot when she first stands, no trauma, often does not wear shoes with arch support, symptoms suggest plantar fasciitis which we have discussed. She has no other pain, no change in bowels, no fever, no yeast infection with doxycycline. She does not do breast self exam, which we have discussed. Remainder of 10 point Review of Systems negative.  Family history discussed. Her mother was diagnosed with breast cancer ~ age 79, 10 years prior to dying of what patient believes was colon cancer at age 74. Maternal half sister had breast cancer also premenopausal, and adult maternal (?half) sibling died of brain tumor.I believe maternal (?half) siblings also had lung and throat cancers. Following visit, I have learned that patient's insurance does not cover genetics counseling and she is not eligible for payment assistance thru [place]. I did not discuss self pay with patient at visit. Per [employer], mammogram scholarships generally allow baseline at age 15 tho she could try an application to see if they would accept prior to 31; she would not be eligible for BCCCP since under 40 unless breast symptoms.   Objective:  Vital signs in last 24 hours:  BP 108/74  Pulse 82  Temp 98.8 F (37.1 C)  Ht 5\' 10"  (1.778 m)  Wt 259 lb 8 oz (117.708 kg)  BMI 37.23 kg/m2 Easily ambulatory, looks comfortable, respirations not labored with exertion in office. Very pleasant and cooperative.   HEENT:PERRLA, sclera clear, anicteric and oropharynx clear, no lesions LymphaticsCervical, supraclavicular, and axillary nodes normal. Resp: clear to auscultation bilaterally and normal percussion bilaterally Cardio: regular  rate and rhythm GI: soft, non-tender; bowel sounds normal; no masses,  no organomegaly Extremities: extremities normal, atraumatic, no cyanosis or edema Skin in axillae with extensive scarring from hidradenitis bilaterally, with 2x3 cm tender erythematous superficial area in right axilla medially and adjacent small pustule, no drainage. She has involvement also in inguinal region, tho I did not examine there today. Breasts: bilaterally without dominant mass,skin or nipple findings of concern. No obvious adenopathy in axillae, tho exam limited with other chronic skin findings.   Lab Results:  Results for orders placed in visit on 02/16/12  CBC WITH DIFFERENTIAL      Component Value Range   WBC 8.6  3.9 - 10.3 10e3/uL   NEUT# 5.9  1.5 - 6.5 10e3/uL   HGB 10.3 (*) 11.6 - 15.9 g/dL   HCT 16.1 (*) 09.6 - 04.5 %   Platelets 381  145 - 400 10e3/uL   MCV 73.9 (*) 79.5 - 101.0 fL   MCH 23.3 (*) 25.1 - 34.0 pg   MCHC 31.5  31.5 - 36.0 g/dL   RBC 4.09  8.11 - 9.14 10e6/uL   RDW 34.4 (*) 11.2 - 14.5 %   lymph# 1.9  0.9 - 3.3 10e3/uL   MONO# 0.5  0.1 - 0.9 10e3/uL   Eosinophils Absolute 0.2  0.0 - 0.5 10e3/uL   Basophils Absolute 0.1  0.0 - 0.1 10e3/uL   NEUT% 68.9  38.4 - 76.8 %   LYMPH% 22.4  14.0 - 49.7 %   MONO% 5.7  0.0 - 14.0 %   EOS% 2.2  0.0 - 7.0 %   BASO% 0.8  0.0 - 2.0 %    Note hgb up to 10.3 and MCV up to 74 Studies/Results:  No results found.  Medications: I have reviewed the patient's current medications. Clindamycin 100 mg bid x 7 days sent to pharmacy for acute hidradenitis symptoms right axilla.  Assessment/Plan: 1.Iron deficiency anemia: seems primarily related to gyn blood loss, improving with iron supplementation. She will continue ferrous gluconate 325 mg daily on empty stomach with OJ. I will see her back in ~ 4 months or sooner if needed. 2.WPW: surgical intervention planned by cardiology. Per [place] cardiology, she is to see Dr Sharrell Ku on Dec  3. 3.hidradenitis suppurative: I believe stage II. Another week of clindamycin as above. I do not believe she has had any ongoing care for this significant problem. I have spoken with dermatology now, which would be correct referral if her insurance allows. 4.strong family history of cancers, including mother with breast at age 67 and colon at age 85. Genetics counseling, baseline mammogram and colonoscopy all seem very appropriate, details to be worked out. 5.obesity: not directly addressed at this visit, but from cancer risk standpoint, ideal weight would be important.  Patient was comfortable with discussion and plan for follow up here. We will be in touch via cell [phone] re above.  Amanda Davenport P, MD   02/16/2012, 11:20 AM

## 2012-02-16 NOTE — Progress Notes (Signed)
Called [facility] to verify coverage for the patient. Patient does have coverage but it is limited medical coverage-not full medical coverage. I inquired if they would cover genetic testing. Per [facility], we would have to submit claim. She could not tell me if they would or not. Darlena and I advised Dr. Darrold Span that she had only the limited coverage with [facility]. If the testing is done, the patient would be responsible for it. The consultation is free but the actual blood test, she would be billed for.

## 2012-02-17 ENCOUNTER — Other Ambulatory Visit: Payer: Self-pay | Admitting: Oncology

## 2012-02-25 ENCOUNTER — Emergency Department (HOSPITAL_COMMUNITY)
Admission: EM | Admit: 2012-02-25 | Discharge: 2012-02-25 | Disposition: A | Discharge status: Home / Self Care | Payer: PRIVATE HEALTH INSURANCE | Attending: Emergency Medicine | Admitting: Emergency Medicine

## 2012-02-25 DIAGNOSIS — Z7982 Long term (current) use of aspirin: Secondary | ICD-10-CM | POA: Insufficient documentation

## 2012-02-25 DIAGNOSIS — Z79899 Other long term (current) drug therapy: Secondary | ICD-10-CM | POA: Insufficient documentation

## 2012-02-25 DIAGNOSIS — D649 Anemia, unspecified: Secondary | ICD-10-CM | POA: Insufficient documentation

## 2012-02-25 DIAGNOSIS — Z8669 Personal history of other diseases of the nervous system and sense organs: Secondary | ICD-10-CM | POA: Insufficient documentation

## 2012-02-25 DIAGNOSIS — M722 Plantar fascial fibromatosis: Secondary | ICD-10-CM | POA: Insufficient documentation

## 2012-02-25 DIAGNOSIS — Z872 Personal history of diseases of the skin and subcutaneous tissue: Secondary | ICD-10-CM | POA: Insufficient documentation

## 2012-02-25 DIAGNOSIS — Z8679 Personal history of other diseases of the circulatory system: Secondary | ICD-10-CM | POA: Insufficient documentation

## 2012-02-25 DIAGNOSIS — Z87891 Personal history of nicotine dependence: Secondary | ICD-10-CM | POA: Insufficient documentation

## 2012-02-25 MED ORDER — OXYCODONE-ACETAMINOPHEN 5-325 MG PO TABS
1.0000 | ORAL_TABLET | Freq: Once | ORAL | Status: AC
  Administered 2012-02-25: 1 via ORAL
  Filled 2012-02-25: qty 1

## 2012-02-25 MED ORDER — OXYCODONE-ACETAMINOPHEN 5-325 MG PO TABS: 2.0000 | ORAL_TABLET | ORAL | Status: DC | PRN

## 2012-02-25 NOTE — ED Provider Notes (Signed)
History  This chart was scribed for Cheri Guppy, MD by Ardeen Jourdain, ED Scribe. This patient was seen in room TR10C/TR10C and the patient's care was started at 1905.  CSN: [id number]  Arrival date & time 02/25/12  1835   None     Chief Complaint  Patient presents with  . Foot Pain     The history is provided by the patient. No language interpreter was used.    Amanda Davenport is a 31 y.o. female who presents to the Emergency Department complaining of bilateral foot pain that has been gradually worsening over the past week. She states the pain is intermittent all day, espically in the morning and if she has been sitting down for a long period of time. She also states that weight bearing aggravates the pain. She reports taking left over prescription medication for the pain as well as ibuprofen and aspirin with some relief. She rates the pain at a 10/10 at this time. She states having similar symptoms to these in the past. She has a h/o boils, chronic HA, and arrhythmia. She is a current everyday smoker but denies alcohol use.   Past Medical History  Diagnosis Date  . Allergy   . Anemia   . Recurrent boils   . Nearsightedness     wears glasses  . Chronic headache   . Arrhythmia     Past Surgical History  Procedure Date  . Cystectomy     tonsils    Family History  Problem Relation Age of Onset  . Cancer Mother     breast, stomach  . Pulmonary embolism Mother     died of PE  . Diabetes Paternal Grandmother   . Heart disease Neg Hx   . Stroke Neg Hx     History  Substance Use Topics  . Smoking status: Former Smoker -- .5 years    Types: Cigarettes  . Smokeless tobacco: Not on file  . Alcohol Use: No    OB History    Grav Para Term Preterm Abortions TAB SAB Ect Mult Living   0               Review of Systems  Musculoskeletal:       Bilateral foot pain    Allergies  Other; Penicillins; and Shrimp  Home Medications   Current Outpatient Rx    Name  Route  Sig  Dispense  Refill  . ASPIRIN 325 MG PO TABS   Oral   Take 325 mg by mouth daily.         Marland Kitchen DOXYCYCLINE HYCLATE 100 MG PO TABS   Oral   Take 100 mg by mouth 2 (two) times daily. For 7 days; Start date 02/16/12         . FERROUS GLUCONATE 324 (38 FE) MG PO TABS   Oral   Take 1 tablet (324 mg total) by mouth 2 (two) times daily.   60 tablet   0   . IBUPROFEN 200 MG PO TABS   Oral   Take 400 mg by mouth every 6 (six) hours as needed. For pain         . SENNA 8.6 MG PO TABS   Oral   Take 2 tablets by mouth at bedtime as needed. For constipation           Triage Vitals: BP 129/74  Pulse 87  Temp 98.6 F (37 C) (Oral)  Resp 18  SpO2 99%  Physical Exam  Nursing note and vitals reviewed. Constitutional: She is oriented to person, place, and time. She appears well-developed and well-nourished. No distress.  HENT:  Head: Normocephalic and atraumatic.  Eyes: EOM are normal. Pupils are equal, round, and reactive to light.  Neck: Normal range of motion. Neck supple. No tracheal deviation present.  Cardiovascular: Normal rate.   Pulmonary/Chest: Effort normal. No respiratory distress.  Abdominal: Soft. She exhibits no distension.  Musculoskeletal: Normal range of motion. She exhibits no edema.       Tenderness to dorsum of left foot, no tenderness to dorsum of left foot.   Neurological: She is alert and oriented to person, place, and time.  Skin: Skin is warm and dry.  Psychiatric: She has a normal mood and affect. Her behavior is normal.    ED Course  Procedures (including critical care time)  DIAGNOSTIC STUDIES: Oxygen Saturation is 99% on room air, normal by my interpretation.    COORDINATION OF CARE:  7:10 PM: Discussed treatment plan which includes pain medication with pt at bedside and pt agreed to plan.    Labs Reviewed - No data to display No results found.   No diagnosis found.    MDM  flantar fasciitis vs  tendonitis     I personally performed the services described in this documentation, which was scribed in my presence. The recorded information has been reviewed and is accurate.     Cheri Guppy, MD 02/25/12 1921

## 2012-02-25 NOTE — ED Notes (Signed)
Pt presents to department for evaluation of bilateral foot pain. Ongoing x1 week. No relief with ibuprofen at home. 10/10 pain at the time, increases with weight bearing. Denies recent injury. She is alert and oriented x4. Ambulatory to triage.

## 2012-03-01 ENCOUNTER — Ambulatory Visit (INDEPENDENT_AMBULATORY_CARE_PROVIDER_SITE_OTHER): Payer: PRIVATE HEALTH INSURANCE | Admitting: Internal Medicine

## 2012-03-01 ENCOUNTER — Encounter: Payer: Self-pay | Admitting: Internal Medicine

## 2012-03-01 VITALS — BP 108/65 | HR 75 | Ht 70.0 in | Wt 262.0 lb

## 2012-03-01 DIAGNOSIS — I456 Pre-excitation syndrome: Secondary | ICD-10-CM

## 2012-03-01 MED ORDER — METOPROLOL TARTRATE 25 MG PO TABS: 25.0000 mg | ORAL_TABLET | Freq: Two times a day (BID) | ORAL | Status: DC

## 2012-03-01 NOTE — Patient Instructions (Signed)
Your physician recommends that you schedule a follow-up appointment in: 3 months with Dr Ladona Ridgel  Your physician has recommended you make the following change in your medication:  1) Start Metoprolol 25mg  twice daily

## 2012-03-01 NOTE — Assessment & Plan Note (Signed)
The patient has very marked ventricular preexcitation and is symptomatic. She has not been on any medical therapy. I've given her a prescription for low-dose beta blocker. We will undergo watchful waiting. We'll see her back in several months.

## 2012-03-01 NOTE — Progress Notes (Signed)
HPI Ms. Rape returns today for followup. She is a very pleasant 31 year old woman with a history of Wolff-Parkinson-White syndrome and palpitations. The patient was hospitalized with chest pain several weeks ago. At that time she was noted to have marked ventricular preexcitation on her echocardiogram. She has had no documented sustained arrhythmias but she notes periods of tachycardia palpitations which lasted for seconds or minutes at a time. She has never had frank syncope. She has been on no medical therapy up until now. When she experiences her symptoms, she will get dizzy. No chest pain or shortness of breath otherwise. Allergies  Allergen Reactions  . Other     All Antibiotics cause severe vaginal yeast infections  . Penicillins Hives, Itching and Swelling  . Shrimp (Shellfish Allergy)      Current Outpatient Prescriptions  Medication Sig Dispense Refill  . aspirin 325 MG tablet Take 325 mg by mouth daily.      Marland Kitchen doxycycline (VIBRA-TABS) 100 MG tablet Take 100 mg by mouth 2 (two) times daily. For 7 days; Start date 02/16/12      . ferrous gluconate (FERGON) 324 MG tablet Take 1 tablet (324 mg total) by mouth 2 (two) times daily.  60 tablet  0  . ibuprofen (ADVIL,MOTRIN) 200 MG tablet Take 400 mg by mouth every 6 (six) hours as needed. For pain      . oxyCODONE-acetaminophen (PERCOCET) 5-325 MG per tablet Take 2 tablets by mouth every 4 (four) hours as needed for pain.  20 tablet  0  . senna (SENOKOT) 8.6 MG TABS Take 2 tablets by mouth at bedtime as needed. For constipation      . metoprolol tartrate (LOPRESSOR) 25 MG tablet Take 1 tablet (25 mg total) by mouth 2 (two) times daily.  180 tablet  3     Past Medical History  Diagnosis Date  . Allergy   . Anemia   . Recurrent boils   . Nearsightedness     wears glasses  . Chronic headache   . Arrhythmia     ROS:   All systems reviewed and negative except as noted in the HPI.   Past Surgical History  Procedure Date  .  Cystectomy     tonsils     Family History  Problem Relation Age of Onset  . Cancer Mother     breast, stomach  . Pulmonary embolism Mother     died of PE  . Diabetes Paternal Grandmother   . Heart disease Neg Hx   . Stroke Neg Hx      History   Social History  . Marital Status: Single    Spouse Name: N/A    Number of Children: N/A  . Years of Education: N/A   Occupational History  . Not on file.   Social History Main Topics  . Smoking status: Former Smoker -- .5 years    Types: Cigarettes  . Smokeless tobacco: Not on file  . Alcohol Use: No  . Drug Use: No  . Sexually Active: Not on file   Other Topics Concern  . Not on file   Social History Narrative  . No narrative on file     BP 108/65  Pulse 75  Ht 5\' 10"  (1.778 m)  Wt 262 lb (118.842 kg)  BMI 37.59 kg/m2  Physical Exam:  Well appearing 31 year old woman, NAD HEENT: Unremarkable Neck:  No JVD, no thyromegally Lungs:  Clear with no wheezes, rales, or rhonchi. HEART:  Regular rate rhythm,  no murmurs, no rubs, no clicks Abd:  soft, positive bowel sounds, no organomegally, no rebound, no guarding Ext:  2 plus pulses, no edema, no cyanosis, no clubbing Skin:  No rashes no nodules Neuro:  CN II through XII intact, motor grossly intact  EKG Normal sinus rhythm with very marked ventricular preexcitation..Wave morphology suggests a right septal accessory pathway versus a right free wall accessory pathway.  Assess/Plan:

## 2012-03-08 ENCOUNTER — Ambulatory Visit: Payer: PRIVATE HEALTH INSURANCE | Admitting: Internal Medicine

## 2012-06-08 ENCOUNTER — Telehealth: Payer: Self-pay | Admitting: Oncology

## 2012-06-08 NOTE — Telephone Encounter (Signed)
s.w. pt and advised on new 4.2.14 appt...per Dr. Cleophas Dunker reques to move

## 2012-06-09 ENCOUNTER — Ambulatory Visit: Payer: PRIVATE HEALTH INSURANCE | Admitting: Internal Medicine

## 2012-06-15 ENCOUNTER — Ambulatory Visit: Payer: Self-pay | Admitting: Oncology

## 2012-06-15 ENCOUNTER — Other Ambulatory Visit: Payer: Self-pay | Admitting: Lab

## 2012-06-24 ENCOUNTER — Telehealth: Payer: Self-pay | Admitting: Family Medicine

## 2012-06-24 NOTE — Telephone Encounter (Signed)
LMOM TO CB 

## 2012-06-24 NOTE — Telephone Encounter (Signed)
Message copied by Janeice Robinson on Fri Jun 24, 2012  4:35 PM ------      Message from: Aleen Campi, DAVID S      Created: Thu Jun 23, 2012  4:05 AM       I received a cardiology f/u note recently on her yesterday.            Looking back, I saw her once as a new patient a few months ago for numerous issues.  She was suppose to come back to f/u on those issues - anemia, abnormal heart rhythm.  Since last visit here, she has been back and forth to the ED, apparently did end up seeing cardiology and got the WPW resolved which is great.            I am just trying to figure out the status.  It looks like in the computer she may have went somewhere else for routine care.  Is she still coming to [place] for primary care or somewhere else?            Why didn't she return for f/u?   ------

## 2012-06-27 NOTE — Telephone Encounter (Signed)
LMOM TO CB 

## 2012-06-27 NOTE — Telephone Encounter (Signed)
Patient states that she did go see the cardiology and they gave her some pills to see if that would help her. She said she only saw that doctor once but she plans to follow up here. She said that she will callback to schedule that appointment. CLS

## 2012-06-30 ENCOUNTER — Encounter (HOSPITAL_COMMUNITY): Payer: Self-pay | Admitting: *Deleted

## 2012-06-30 ENCOUNTER — Emergency Department (HOSPITAL_COMMUNITY)
Admission: EM | Admit: 2012-06-30 | Discharge: 2012-06-30 | Disposition: A | Discharge status: Home / Self Care | Payer: Self-pay | Attending: Emergency Medicine | Admitting: Emergency Medicine

## 2012-06-30 DIAGNOSIS — Z8679 Personal history of other diseases of the circulatory system: Secondary | ICD-10-CM | POA: Insufficient documentation

## 2012-06-30 DIAGNOSIS — D649 Anemia, unspecified: Secondary | ICD-10-CM | POA: Insufficient documentation

## 2012-06-30 DIAGNOSIS — F17200 Nicotine dependence, unspecified, uncomplicated: Secondary | ICD-10-CM | POA: Insufficient documentation

## 2012-06-30 DIAGNOSIS — Z79899 Other long term (current) drug therapy: Secondary | ICD-10-CM | POA: Insufficient documentation

## 2012-06-30 DIAGNOSIS — Z872 Personal history of diseases of the skin and subcutaneous tissue: Secondary | ICD-10-CM | POA: Insufficient documentation

## 2012-06-30 DIAGNOSIS — J029 Acute pharyngitis, unspecified: Secondary | ICD-10-CM | POA: Insufficient documentation

## 2012-06-30 HISTORY — DX: Pre-excitation syndrome: I45.6

## 2012-06-30 LAB — CBC WITH DIFFERENTIAL/PLATELET
Basophils Relative: 0 % (ref 0–1)
Eosinophils Absolute: 0 10*3/uL (ref 0.0–0.7)
Eosinophils Relative: 0 % (ref 0–5)
Hemoglobin: 11.9 g/dL — ABNORMAL LOW (ref 12.0–15.0)
MCH: 27.3 pg (ref 26.0–34.0)
MCHC: 32.8 g/dL (ref 30.0–36.0)
MCV: 83.3 fL (ref 78.0–100.0)
Monocytes Absolute: 0.6 10*3/uL (ref 0.1–1.0)
Monocytes Relative: 4 % (ref 3–12)
Neutrophils Relative %: 76 % (ref 43–77)

## 2012-06-30 MED ORDER — DEXAMETHASONE SODIUM PHOSPHATE 10 MG/ML IJ SOLN
10.0000 mg | Freq: Once | INTRAMUSCULAR | Status: AC
  Administered 2012-06-30: 10 mg via INTRAMUSCULAR
  Filled 2012-06-30: qty 1

## 2012-06-30 NOTE — ED Notes (Signed)
PT to ED c/o cough since Monday and sore throat since yesterday.  No redness noted to pharynx, but white exudate to R tonsil.  Pt c/o weakness x 2 weeks and is worried her anemia is getting worse.  States she had a blood transfusion in October and doesn't want to get to that point. Pt does not have a pcp.

## 2012-06-30 NOTE — ED Provider Notes (Signed)
History     CSN: [id number]  Arrival date & time 06/30/12  1213   First MD Initiated Contact with Patient 06/30/12 1323      Chief Complaint  Patient presents with  . Sore Throat     Patient is a 32 y.o. female presenting with pharyngitis.  Sore Throat   PT to ED c/o cough since Monday and sore throat since yesterday. No redness noted to pharynx, but white exudate to R tonsil. Pt c/o weakness x 2 weeks and is worried her anemia is getting worse. States she had a blood transfusion in October and doesn't want to get to that point. Pt does not have a pcp.  Past Medical History  Diagnosis Date  . Allergy   . Anemia   . Recurrent boils   . Nearsightedness     wears glasses  . Chronic headache   . Arrhythmia   . WPW (Wolff-Parkinson-White syndrome)     Past Surgical History  Procedure Laterality Date  . Cystectomy      tonsils    Family History  Problem Relation Age of Onset  . Cancer Mother     breast, stomach  . Pulmonary embolism Mother     died of PE  . Diabetes Paternal Grandmother   . Heart disease Neg Hx   . Stroke Neg Hx     History  Substance Use Topics  . Smoking status: Current Every Day Smoker -- .5 years    Types: Cigarettes  . Smokeless tobacco: Not on file     Comment: smokes black and milds  . Alcohol Use: Yes     Comment: occasionally    OB History   Grav Para Term Preterm Abortions TAB SAB Ect Mult Living   0               Review of Systems  Allergies  Other; Penicillins; and Shrimp  Home Medications   Current Outpatient Rx  Name  Route  Sig  Dispense  Refill  . diphenhydrAMINE (BENADRYL) 25 MG tablet   Oral   Take 50 mg by mouth every 6 (six) hours as needed for itching or allergies.         Marland Kitchen DM-Doxylamine-Acetaminophen (VICKS NYQUIL COLD & FLU NIGHT) 15-6.25-325 MG CAPS   Oral   Take 2 capsules by mouth at bedtime as needed (for cold and cough).         . ferrous sulfate 325 (65 FE) MG tablet   Oral   Take 325 mg  by mouth daily as needed (for iron).         Marland Kitchen ibuprofen (ADVIL,MOTRIN) 200 MG tablet   Oral   Take 400 mg by mouth every 6 (six) hours as needed. For pain         . metoprolol tartrate (LOPRESSOR) 25 MG tablet   Oral   Take 1 tablet (25 mg total) by mouth 2 (two) times daily.   180 tablet   3   . TURMERIC PO   Oral   Take 1 capsule by mouth 2 (two) times daily.         . Vitamins A & D (VITAMIN A & D) ointment   Topical   Apply 1 application topically as needed (for boiles).           BP 106/73  Pulse 105  Temp(Src) 98.6 F (37 C) (Oral)  Resp 16  SpO2 100%  LMP 05/29/2012  Physical Exam  Nursing note and  vitals reviewed. Constitutional: She is oriented to person, place, and time. She appears well-developed and well-nourished. No distress.  HENT:  Head: Normocephalic and atraumatic.  Mouth/Throat: Posterior oropharyngeal erythema present. No tonsillar abscesses.  Eyes: Pupils are equal, round, and reactive to light.  Neck: Normal range of motion.  Cardiovascular: Normal rate and intact distal pulses.   Pulmonary/Chest: No respiratory distress.  Abdominal: Normal appearance. She exhibits no distension.  Musculoskeletal: Normal range of motion.  Neurological: She is alert and oriented to person, place, and time. No cranial nerve deficit.  Skin: Skin is warm and dry. No rash noted.  Psychiatric: She has a normal mood and affect. Her behavior is normal.    ED Course  Procedures (including critical care time)    Medications given in the emergency department: dexamethasone (DECADRON) injection 10 mg   Labs Reviewed  CBC WITH DIFFERENTIAL - Abnormal; Notable for the following:    WBC 13.6 (*)    Hemoglobin 11.9 (*)    Platelets 428 (*)    Neutro Abs 10.3 (*)    All other components within normal limits  RAPID STREP SCREEN   No results found.   1. Sore throat       MDM          Nelia Shi, MD 06/30/12 [phone]

## 2012-07-06 ENCOUNTER — Encounter: Payer: Self-pay | Admitting: Internal Medicine

## 2012-07-06 ENCOUNTER — Other Ambulatory Visit: Payer: Self-pay | Admitting: Lab

## 2012-07-06 ENCOUNTER — Ambulatory Visit: Payer: Self-pay | Admitting: Oncology

## 2012-07-07 ENCOUNTER — Telehealth: Payer: Self-pay | Admitting: *Deleted

## 2012-07-07 ENCOUNTER — Telehealth: Payer: Self-pay | Admitting: Oncology

## 2012-07-07 ENCOUNTER — Other Ambulatory Visit: Payer: Self-pay | Admitting: *Deleted

## 2012-07-07 NOTE — Telephone Encounter (Signed)
Called patient to see why she missed her appointment on 07/06/12, states she didn't realize she had an appointment. Requested it to be rescheduled. POF to scheduler

## 2012-08-02 ENCOUNTER — Emergency Department (HOSPITAL_COMMUNITY)
Admission: EM | Admit: 2012-08-02 | Discharge: 2012-08-02 | Disposition: A | Discharge status: Home / Self Care | Payer: Self-pay | Attending: Emergency Medicine | Admitting: Emergency Medicine

## 2012-08-02 ENCOUNTER — Emergency Department (HOSPITAL_COMMUNITY): Payer: Self-pay

## 2012-08-02 ENCOUNTER — Encounter (HOSPITAL_COMMUNITY): Payer: Self-pay | Admitting: Emergency Medicine

## 2012-08-02 DIAGNOSIS — I456 Pre-excitation syndrome: Secondary | ICD-10-CM | POA: Insufficient documentation

## 2012-08-02 DIAGNOSIS — R197 Diarrhea, unspecified: Secondary | ICD-10-CM | POA: Insufficient documentation

## 2012-08-02 DIAGNOSIS — M549 Dorsalgia, unspecified: Secondary | ICD-10-CM | POA: Insufficient documentation

## 2012-08-02 DIAGNOSIS — G8929 Other chronic pain: Secondary | ICD-10-CM | POA: Insufficient documentation

## 2012-08-02 DIAGNOSIS — D509 Iron deficiency anemia, unspecified: Secondary | ICD-10-CM | POA: Insufficient documentation

## 2012-08-02 DIAGNOSIS — R112 Nausea with vomiting, unspecified: Secondary | ICD-10-CM | POA: Insufficient documentation

## 2012-08-02 DIAGNOSIS — Z79899 Other long term (current) drug therapy: Secondary | ICD-10-CM | POA: Insufficient documentation

## 2012-08-02 DIAGNOSIS — F17200 Nicotine dependence, unspecified, uncomplicated: Secondary | ICD-10-CM | POA: Insufficient documentation

## 2012-08-02 DIAGNOSIS — Z8679 Personal history of other diseases of the circulatory system: Secondary | ICD-10-CM | POA: Insufficient documentation

## 2012-08-02 DIAGNOSIS — Z3202 Encounter for pregnancy test, result negative: Secondary | ICD-10-CM | POA: Insufficient documentation

## 2012-08-02 DIAGNOSIS — R630 Anorexia: Secondary | ICD-10-CM | POA: Insufficient documentation

## 2012-08-02 DIAGNOSIS — R1031 Right lower quadrant pain: Secondary | ICD-10-CM | POA: Insufficient documentation

## 2012-08-02 DIAGNOSIS — R109 Unspecified abdominal pain: Secondary | ICD-10-CM

## 2012-08-02 LAB — URINALYSIS, MICROSCOPIC ONLY
Protein, ur: NEGATIVE mg/dL
Urobilinogen, UA: 1 mg/dL (ref 0.0–1.0)

## 2012-08-02 LAB — CBC WITH DIFFERENTIAL/PLATELET
Basophils Relative: 1 % (ref 0–1)
Eosinophils Absolute: 0.1 10*3/uL (ref 0.0–0.7)
HCT: 33.5 % — ABNORMAL LOW (ref 36.0–46.0)
Hemoglobin: 10.9 g/dL — ABNORMAL LOW (ref 12.0–15.0)
MCV: 78.3 fL (ref 78.0–100.0)
Monocytes Relative: 5 % (ref 3–12)
RBC: 4.28 MIL/uL (ref 3.87–5.11)
WBC: 8 10*3/uL (ref 4.0–10.5)

## 2012-08-02 LAB — COMPREHENSIVE METABOLIC PANEL
Albumin: 3.1 g/dL — ABNORMAL LOW (ref 3.5–5.2)
BUN: 8 mg/dL (ref 6–23)
Calcium: 8.9 mg/dL (ref 8.4–10.5)
Creatinine, Ser: 0.71 mg/dL (ref 0.50–1.10)
GFR calc Af Amer: >90 mL/min (ref >90)
Glucose, Bld: 110 mg/dL — ABNORMAL HIGH (ref 70–99)
Potassium: 3.6 mEq/L (ref 3.5–5.1)
Total Protein: 9 g/dL — ABNORMAL HIGH (ref 6.0–8.3)

## 2012-08-02 MED ORDER — SODIUM CHLORIDE 0.9 % IV SOLN
1000.0000 mL | INTRAVENOUS | Status: DC
  Administered 2012-08-02: 1000 mL via INTRAVENOUS

## 2012-08-02 MED ORDER — IOHEXOL 300 MG/ML  SOLN
100.0000 mL | Freq: Once | INTRAMUSCULAR | Status: AC | PRN
  Administered 2012-08-02: 100 mL via INTRAVENOUS

## 2012-08-02 MED ORDER — ONDANSETRON HCL 4 MG/2ML IJ SOLN
4.0000 mg | Freq: Once | INTRAMUSCULAR | Status: AC
  Administered 2012-08-02: 4 mg via INTRAVENOUS
  Filled 2012-08-02: qty 2

## 2012-08-02 MED ORDER — OXYCODONE-ACETAMINOPHEN 5-325 MG PO TABS: 1.0000 | ORAL_TABLET | ORAL | Status: DC | PRN

## 2012-08-02 MED ORDER — MORPHINE SULFATE 4 MG/ML IJ SOLN
4.0000 mg | Freq: Once | INTRAMUSCULAR | Status: AC
  Administered 2012-08-02: 4 mg via INTRAVENOUS
  Filled 2012-08-02: qty 1

## 2012-08-02 MED ORDER — SODIUM CHLORIDE 0.9 % IV SOLN
1000.0000 mL | Freq: Once | INTRAVENOUS | Status: AC
  Administered 2012-08-02: 1000 mL via INTRAVENOUS

## 2012-08-02 MED ORDER — IOHEXOL 300 MG/ML  SOLN
50.0000 mL | Freq: Once | INTRAMUSCULAR | Status: AC | PRN
  Administered 2012-08-02: 50 mL via ORAL

## 2012-08-02 NOTE — ED Provider Notes (Signed)
History     CSN: [id number]  Arrival date & time 08/02/12  1234   First MD Initiated Contact with Patient 08/02/12 1507      Chief Complaint  Patient presents with  . Abdominal Pain  . Back Pain    (Consider location/radiation/quality/duration/timing/severity/associated sxs/prior treatment) Patient is a 32 y.o. female presenting with abdominal pain and back pain. The history is provided by the patient.  Abdominal Pain Back Pain Associated symptoms: abdominal pain   She had sudden onset at about noon of severe right lower quadrant crampy pain which radiated to the right flank. Pain is worse when she walks and better when she lays flat. There is associated nausea and vomiting and anorexia. She denies constipation or diarrhea and denies dysuria. Last menses ended yesterday and was normal except for coming a couple of days earlier than expected. She's not using any contraception. She's not taken any medication to try and help her pain. She rates pain at 8/10 when she stands and 4/10 while laying still.  Past Medical History  Diagnosis Date  . Allergy   . Anemia   . Recurrent boils   . Nearsightedness     wears glasses  . Chronic headache   . Arrhythmia   . WPW (Wolff-Parkinson-White syndrome)     Past Surgical History  Procedure Laterality Date  . Cystectomy      tonsils    Family History  Problem Relation Age of Onset  . Cancer Mother     breast, stomach  . Pulmonary embolism Mother     died of PE  . Diabetes Paternal Grandmother   . Heart disease Neg Hx   . Stroke Neg Hx     History  Substance Use Topics  . Smoking status: Current Every Day Smoker -- .5 years    Types: Cigarettes  . Smokeless tobacco: Not on file     Comment: smokes black and milds  . Alcohol Use: Yes     Comment: occasionally    OB History   Grav Para Term Preterm Abortions TAB SAB Ect Mult Living   0               Review of Systems  Gastrointestinal: Positive for abdominal pain.   Musculoskeletal: Positive for back pain.  All other systems reviewed and are negative.    Allergies  Other; Penicillins; and Shrimp  Home Medications   Current Outpatient Rx  Name  Route  Sig  Dispense  Refill  . diphenhydrAMINE (BENADRYL) 25 MG tablet   Oral   Take 50 mg by mouth every 6 (six) hours as needed for itching or allergies.         Marland Kitchen DM-Doxylamine-Acetaminophen (VICKS NYQUIL COLD & FLU NIGHT) 15-6.25-325 MG CAPS   Oral   Take 2 capsules by mouth at bedtime as needed (for cold and cough).         . ferrous sulfate 325 (65 FE) MG tablet   Oral   Take 325 mg by mouth daily as needed (for iron).         Marland Kitchen ibuprofen (ADVIL,MOTRIN) 200 MG tablet   Oral   Take 400 mg by mouth every 6 (six) hours as needed. For pain         . metoprolol tartrate (LOPRESSOR) 25 MG tablet   Oral   Take 1 tablet (25 mg total) by mouth 2 (two) times daily.   180 tablet   3   . TURMERIC PO  Oral   Take 1 capsule by mouth 2 (two) times daily.         . Vitamins A & D (VITAMIN A & D) ointment   Topical   Apply 1 application topically as needed (for boiles).           BP 107/64  Pulse 77  Temp(Src) 98.1 F (36.7 C) (Oral)  Resp 20  Ht 5\' 9"  (1.753 m)  Wt 260 lb (117.935 kg)  BMI 38.38 kg/m2  SpO2 100%  Physical Exam  Nursing note and vitals reviewed.  32 year old female, resting comfortably and in no acute distress. Vital signs are normal. Oxygen saturation is 100%, which is normal. Head is normocephalic and atraumatic. PERRLA, EOMI. Oropharynx is clear. Neck is nontender and supple without adenopathy or JVD. Back is nontender and there is no CVA tenderness. Lungs are clear without rales, wheezes, or rhonchi. Chest is nontender. Heart has regular rate and rhythm without murmur. Abdomen is soft, flat, with moderate right mid abdominal pain with rebound tenderness present. There is no suprapubic tenderness whatsoever. There is no guarding. There are no masses  or hepatosplenomegaly and peristalsis is hypoactive. Extremities have no cyanosis or edema, full range of motion is present. Skin is warm and dry without rash. Neurologic: Mental status is normal, cranial nerves are intact, there are no motor or sensory deficits.  ED Course  Procedures (including critical care time)  Results for orders placed during the hospital encounter of 08/02/12  COMPREHENSIVE METABOLIC PANEL      Result Value Range   Sodium 137  135 - 145 mEq/L   Potassium 3.6  3.5 - 5.1 mEq/L   Chloride 103  96 - 112 mEq/L   CO2 26  19 - 32 mEq/L   Glucose, Bld 110 (*) 70 - 99 mg/dL   BUN 8  6 - 23 mg/dL   Creatinine, Ser 1.61  0.50 - 1.10 mg/dL   Calcium 8.9  8.4 - 09.6 mg/dL   Total Protein 9.0 (*) 6.0 - 8.3 g/dL   Albumin 3.1 (*) 3.5 - 5.2 g/dL   AST 14  0 - 37 U/L   ALT 6  0 - 35 U/L   Alkaline Phosphatase 68  39 - 117 U/L   Total Bilirubin 0.2 (*) 0.3 - 1.2 mg/dL   GFR calc non Af Amer >90  >90 mL/min   GFR calc Af Amer >90  >90 mL/min  CBC WITH DIFFERENTIAL      Result Value Range   WBC 8.0  4.0 - 10.5 K/uL   RBC 4.28  3.87 - 5.11 MIL/uL   Hemoglobin 10.9 (*) 12.0 - 15.0 g/dL   HCT 04.5 (*) 40.9 - 81.1 %   MCV 78.3  78.0 - 100.0 fL   MCH 25.5 (*) 26.0 - 34.0 pg   MCHC 32.5  30.0 - 36.0 g/dL   RDW 91.4  78.2 - 95.6 %   Platelets 455 (*) 150 - 400 K/uL   Neutrophils Relative 57  43 - 77 %   Neutro Abs 4.5  1.7 - 7.7 K/uL   Lymphocytes Relative 36  12 - 46 %   Lymphs Abs 2.9  0.7 - 4.0 K/uL   Monocytes Relative 5  3 - 12 %   Monocytes Absolute 0.4  0.1 - 1.0 K/uL   Eosinophils Relative 1  0 - 5 %   Eosinophils Absolute 0.1  0.0 - 0.7 K/uL   Basophils Relative 1  0 -  1 %   Basophils Absolute 0.0  0.0 - 0.1 K/uL  URINALYSIS, MICROSCOPIC ONLY      Result Value Range   Color, Urine YELLOW  YELLOW   APPearance CLEAR  CLEAR   Specific Gravity, Urine 1.025  1.005 - 1.030   pH 6.5  5.0 - 8.0   Glucose, UA NEGATIVE  NEGATIVE mg/dL   Hgb urine dipstick TRACE (*)  NEGATIVE   Bilirubin Urine NEGATIVE  NEGATIVE   Ketones, ur NEGATIVE  NEGATIVE mg/dL   Protein, ur NEGATIVE  NEGATIVE mg/dL   Urobilinogen, UA 1.0  0.0 - 1.0 mg/dL   Nitrite NEGATIVE  NEGATIVE   Leukocytes, UA SMALL (*) NEGATIVE   WBC, UA 3-6  <3 WBC/hpf   RBC / HPF 0-2  <3 RBC/hpf   Squamous Epithelial / LPF FEW (*) RARE  POCT PREGNANCY, URINE      Result Value Range   Preg Test, Ur NEGATIVE  NEGATIVE   Ct Abdomen Pelvis W Contrast  08/02/2012  *RADIOLOGY REPORT*  Clinical Data: Abdominal pain.  Back pain.  Lower abdominal pain radiating to the back.  CT ABDOMEN AND PELVIS WITH CONTRAST  Technique:  Multidetector CT imaging of the abdomen and pelvis was performed following the standard protocol during bolus administration of intravenous contrast.  Contrast: OMNIPAQUE IOHEXOL 300 MG/ML  SOLN  Comparison: None.  Findings: Lung Bases: Normal.  Liver:  Normal.  Spleen:  Normal.  Gallbladder:  Normal.  Common bile duct:  Normal.  Pancreas:  Normal.  Adrenal glands:  Normal.  Kidneys:  Normal renal enhancement.  Ureters appear normal.  Stomach:  Normal.  Partially decompressed.  Small bowel:  Normal duodenum.  The proximal small bowel appears normal.  No mesenteric adenopathy, inflammatory changes or free air / free fluid.  Colon:   Normal appendix identified in the right lower quadrant, closely opposed to a loop of small bowel.  Ascending colon appears normal.  Colonic diverticulosis without diverticulitis.  The majority the colon is decompressed.  Pelvic Genitourinary:  Physiologic free fluid.  Physiologic appearance of the uterus and adnexa.  Urinary bladder normal. Phleboliths incidentally noted.  Bones:  No aggressive osseous lesions.  Vasculature: Normal.  IMPRESSION: No acute abnormality.   Original Report Authenticated By: Andreas Newport, M.D.       1. Abdominal pain   2. Diarrhea       MDM  Abdominal pain with location worrisome for possible appendicitis. She will be sent for CT  scan.  Old records are reviewed and she has a history of iron deficiency anemia which is felt to be due to menstrual blood loss. She has been on iron but patient admits that she has not been taking it as prescribed. Hemoglobin is noted to have dropped 1 g in the last month.  She got excellent relief of pain with hydromorphone. After returning from CT, she's developed diarrhea. CT is unremarkable. Abdominal exam was repeated and she is completely nontender. At this point, I feel that her pain is probably the initial presentation of a viral enteritis. She is given a prescription for Percocet for pain and is advised to use over-the-counter loperamide as needed for diarrhea. She's to return if symptoms worsen.  Dione Booze, MD 08/02/12 [phone]

## 2012-08-02 NOTE — ED Notes (Signed)
Rx x 1.  Pt voiced understanding to f/u with PCP and return for worsening condition.  

## 2012-08-02 NOTE — ED Notes (Signed)
Pt is aware of need of urine specimen; pt states she does not have to go at this time but will let us know when she has to go

## 2012-08-02 NOTE — ED Notes (Signed)
Pt c/o lower abd pain into back x 30 min; pt denies vagina bleeding or discharge; pt denies dysuria

## 2012-08-02 NOTE — ED Notes (Signed)
Called CT and informed that pt finished with first cup of contrast

## 2012-08-02 NOTE — ED Notes (Signed)
Dr. Glick at bedside.  

## 2012-08-05 ENCOUNTER — Other Ambulatory Visit: Payer: Self-pay | Admitting: Lab

## 2012-08-05 ENCOUNTER — Ambulatory Visit: Payer: Self-pay | Admitting: Oncology

## 2012-08-07 ENCOUNTER — Emergency Department (HOSPITAL_COMMUNITY)
Admission: EM | Admit: 2012-08-07 | Discharge: 2012-08-07 | Disposition: A | Discharge status: Home / Self Care | Payer: Self-pay | Attending: Emergency Medicine | Admitting: Emergency Medicine

## 2012-08-07 ENCOUNTER — Encounter (HOSPITAL_COMMUNITY): Payer: Self-pay | Admitting: Family Medicine

## 2012-08-07 ENCOUNTER — Emergency Department (HOSPITAL_COMMUNITY): Payer: Self-pay

## 2012-08-07 ENCOUNTER — Telehealth (HOSPITAL_COMMUNITY): Payer: Self-pay | Admitting: Emergency Medicine

## 2012-08-07 DIAGNOSIS — R079 Chest pain, unspecified: Secondary | ICD-10-CM | POA: Insufficient documentation

## 2012-08-07 DIAGNOSIS — Z862 Personal history of diseases of the blood and blood-forming organs and certain disorders involving the immune mechanism: Secondary | ICD-10-CM | POA: Insufficient documentation

## 2012-08-07 DIAGNOSIS — J45909 Unspecified asthma, uncomplicated: Secondary | ICD-10-CM

## 2012-08-07 DIAGNOSIS — J45901 Unspecified asthma with (acute) exacerbation: Secondary | ICD-10-CM | POA: Insufficient documentation

## 2012-08-07 DIAGNOSIS — R0602 Shortness of breath: Secondary | ICD-10-CM | POA: Insufficient documentation

## 2012-08-07 DIAGNOSIS — Z872 Personal history of diseases of the skin and subcutaneous tissue: Secondary | ICD-10-CM | POA: Insufficient documentation

## 2012-08-07 DIAGNOSIS — J3489 Other specified disorders of nose and nasal sinuses: Secondary | ICD-10-CM | POA: Insufficient documentation

## 2012-08-07 DIAGNOSIS — Z880 Allergy status to penicillin: Secondary | ICD-10-CM | POA: Insufficient documentation

## 2012-08-07 DIAGNOSIS — F17200 Nicotine dependence, unspecified, uncomplicated: Secondary | ICD-10-CM | POA: Insufficient documentation

## 2012-08-07 DIAGNOSIS — J069 Acute upper respiratory infection, unspecified: Secondary | ICD-10-CM | POA: Insufficient documentation

## 2012-08-07 DIAGNOSIS — Z8679 Personal history of other diseases of the circulatory system: Secondary | ICD-10-CM | POA: Insufficient documentation

## 2012-08-07 LAB — RAPID STREP SCREEN (MED CTR MEBANE ONLY): Streptococcus, Group A Screen (Direct): NEGATIVE

## 2012-08-07 MED ORDER — PREDNISONE 20 MG PO TABS
60.0000 mg | ORAL_TABLET | Freq: Once | ORAL | Status: AC
  Administered 2012-08-07: 60 mg via ORAL
  Filled 2012-08-07: qty 3

## 2012-08-07 MED ORDER — HYDROCODONE-ACETAMINOPHEN 7.5-325 MG/15ML PO SOLN: 15.0000 mL | Freq: Three times a day (TID) | ORAL | Status: DC | PRN

## 2012-08-07 MED ORDER — PREDNISONE 20 MG PO TABS: 40.0000 mg | ORAL_TABLET | Freq: Every day | ORAL | Status: DC

## 2012-08-07 MED ORDER — ALBUTEROL SULFATE (5 MG/ML) 0.5% IN NEBU
5.0000 mg | INHALATION_SOLUTION | Freq: Once | RESPIRATORY_TRACT | Status: AC
  Administered 2012-08-07: 5 mg via RESPIRATORY_TRACT
  Filled 2012-08-07: qty 1

## 2012-08-07 MED ORDER — ALBUTEROL SULFATE HFA 108 (90 BASE) MCG/ACT IN AERS
2.0000 | INHALATION_SPRAY | Freq: Once | RESPIRATORY_TRACT | Status: DC
  Filled 2012-08-07: qty 6.7

## 2012-08-07 MED ORDER — AZITHROMYCIN 250 MG PO TABS: ORAL_TABLET | ORAL | Status: DC

## 2012-08-07 NOTE — ED Provider Notes (Signed)
History     CSN: [id number]  Arrival date & time 08/07/12  1012   First MD Initiated Contact with Patient 08/07/12 1025      Chief Complaint  Patient presents with  . Fever    (Consider location/radiation/quality/duration/timing/severity/associated sxs/prior treatment) HPI  Amanda Davenport is a 32 y.o. female complaining of intermittently productive cough with blood streaked sputum, shortness of breath, runny nose and sore throat with fever (temperature maximum 102C x2 days ago). All symptoms started 2 days ago. Patient denies chest pain, abdominal pain, nausea/vomiting, headache, neck stiffness, rash, dysuria, frequency, abnormal vaginal discharge, recent travel or immobilization, exogenous estrogen, leg swelling or calf tenderness.. Patient has been taking over-the-counter medications with little relief. For fever control she has been taking aspirin. She is normally a smoker but has not smoked in the last several days.  Past Medical History  Diagnosis Date  . Allergy   . Anemia   . Recurrent boils   . Nearsightedness     wears glasses  . Chronic headache   . Arrhythmia   . WPW (Wolff-Parkinson-White syndrome)     Past Surgical History  Procedure Laterality Date  . Cystectomy      tonsils    Family History  Problem Relation Age of Onset  . Cancer Mother     breast, stomach  . Pulmonary embolism Mother     died of PE  . Diabetes Paternal Grandmother   . Heart disease Neg Hx   . Stroke Neg Hx     History  Substance Use Topics  . Smoking status: Current Every Day Smoker -- .5 years    Types: Cigarettes  . Smokeless tobacco: Not on file     Comment: smokes black and milds  . Alcohol Use: Yes     Comment: occasionally    OB History   Grav Para Term Preterm Abortions TAB SAB Ect Mult Living   0               Review of Systems  Constitutional: Positive for fever.  HENT: Positive for rhinorrhea.   Respiratory: Positive for shortness of breath.    Cardiovascular: Positive for chest pain.  Gastrointestinal: Negative for nausea, vomiting, abdominal pain and diarrhea.  All other systems reviewed and are negative.    Allergies  Other; Penicillins; and Shrimp  Home Medications   Current Outpatient Rx  Name  Route  Sig  Dispense  Refill  . Homeopathic Products [facility] COLD REMEDY) TBDP   Oral   Take 2 tablets by mouth 2 (two) times daily as needed (for cold symptoms).         . Multiple Vitamins-Minerals (ZINC PO)   Oral   Take 1 tablet by mouth daily.           BP 114/65  Pulse 101  Temp(Src) 99.9 F (37.7 C)  Resp 18  SpO2 98%  LMP 07/27/2012  Physical Exam  Nursing note and vitals reviewed. Constitutional: She is oriented to person, place, and time. She appears well-developed and well-nourished. No distress.  Appears acutely ill.  HENT:  Head: Normocephalic.  Mouth/Throat: Oropharynx is clear and moist.  Eyes: Conjunctivae and EOM are normal. Pupils are equal, round, and reactive to light.  Neck: Normal range of motion.  Patient has full range of motion to neck. No tenderness to deep palpation of the posterior cervical spine. Patient can flex chin to chest with no pain.   Cardiovascular: Normal rate and normal heart sounds.  Pulmonary/Chest: Effort normal. No stridor. No respiratory distress. She has wheezes. She has no rales. She exhibits no tenderness.  Route scattered expiratory wheezing  Abdominal: Soft. There is no tenderness. There is no rebound and no guarding.  Musculoskeletal: Normal range of motion. She exhibits no edema.  No calf asymmetry, superficial collaterals, palpable cords, edema, Homans sign negative bilaterally.    Neurological: She is alert and oriented to person, place, and time.  Skin: No rash noted.  Psychiatric: She has a normal mood and affect.    ED Course  Procedures (including critical care time)  Labs Reviewed  RAPID STREP SCREEN   Dg Chest 2 View  08/07/2012   *RADIOLOGY REPORT*  Clinical Data: Cough, congestion  CHEST - 2 VIEW  Comparison:  08/14/2011, 08/25/2011  Findings:  The heart size and mediastinal contours are within normal limits.  Both lungs are clear.  The visualized skeletal structures are unremarkable.  IMPRESSION: No active cardiopulmonary disease.   Original Report Authenticated By: Judie Petit. Shick, M.D.      1. URI, acute   2. Reactive airway disease with wheezing       MDM   Amanda Davenport is a 32 y.o. female with intermittently productive cough with blood streaked sputum, expiratory wheezing on exam. Patient is saturating well, there are no infiltrates on x-ray. URI is likely setting off a reactive airway. I will also treat her with azithromycin as she is a smoker. Patient states she has recently cut down her smoking because of this illness. I have encouraged her to use opportunity to stop smoking altogether.    Filed Vitals:   08/07/12 1020  BP: 114/65  Pulse: 101  Temp: 99.9 F (37.7 C)  Resp: 18  SpO2: 98%     VSS and patient is appropriate for, and amenable to, discharge at this time. Pt verbalized understanding and agrees with care plan. Outpatient follow-up and return precautions given.    Discharge Medication List as of 08/07/2012 12:09 PM    START taking these medications   Details  azithromycin (ZITHROMAX Z-PAK) 250 MG tablet 2 po day one, then 1 daily x 4 days, Print    HYDROcodone-acetaminophen (HYCET) 7.5-325 mg/15 ml solution Take 15 mLs by mouth every 8 (eight) hours as needed for pain., Starting 08/07/2012, Until Discontinued, Print    predniSONE (DELTASONE) 20 MG tablet Take 2 tablets (40 mg total) by mouth daily., Starting 08/07/2012, Until Discontinued, [employer], PA-C 08/07/12 [phone]

## 2012-08-07 NOTE — ED Notes (Addendum)
Per pt sts fever x 3 days with cough and congestion. sts took some cold medication without relief. sts sore throat from nasal drainage. Pt eating cough drop and sts is soothing her throat.

## 2012-08-07 NOTE — ED Provider Notes (Signed)
Medical screening examination/treatment/procedure(s) were performed by non-physician practitioner and as supervising physician I was immediately available for consultation/collaboration.    Azhar Yogi R Gwenith Tschida, MD 08/07/12 1554 

## 2012-08-24 ENCOUNTER — Inpatient Hospital Stay (HOSPITAL_COMMUNITY)
Admission: AD | Admit: 2012-08-24 | Discharge: 2012-08-25 | Disposition: A | Discharge status: Home / Self Care | Payer: PRIVATE HEALTH INSURANCE | Source: Ambulatory Visit | Attending: Obstetrics and Gynecology | Admitting: Obstetrics and Gynecology

## 2012-08-24 ENCOUNTER — Encounter (HOSPITAL_COMMUNITY): Payer: Self-pay

## 2012-08-24 DIAGNOSIS — N920 Excessive and frequent menstruation with regular cycle: Secondary | ICD-10-CM | POA: Insufficient documentation

## 2012-08-24 LAB — POCT PREGNANCY, URINE: Preg Test, Ur: NEGATIVE

## 2012-08-24 MED ORDER — IBUPROFEN 800 MG PO TABS
800.0000 mg | ORAL_TABLET | Freq: Once | ORAL | Status: AC
  Administered 2012-08-25: 800 mg via ORAL
  Filled 2012-08-24: qty 1

## 2012-08-24 NOTE — MAU Provider Note (Signed)
History     CSN: [id number]  Arrival date and time: 08/24/12 2316   First Provider Initiated Contact with Patient 08/24/12 2357      Chief Complaint  Patient presents with  . Vaginal Bleeding   HPI  Amanda Davenport is 32 y.o. who presents tonight because she is having her period and it is heavier than normal. She has passed a few dime and nickel sized clots. She has not been soaking a pad an hour. She denies any pain. She is concerned because she has a hx of anemia. Her last hgb was 10.9 in April.   Past Medical History  Diagnosis Date  . Allergy   . Anemia   . Recurrent boils   . Nearsightedness     wears glasses  . Chronic headache   . Arrhythmia   . WPW (Wolff-Parkinson-White syndrome)     Past Surgical History  Procedure Laterality Date  . Cystectomy      tonsils    Family History  Problem Relation Age of Onset  . Cancer Mother     breast, stomach  . Pulmonary embolism Mother     died of PE  . Diabetes Paternal Grandmother   . Heart disease Neg Hx   . Stroke Neg Hx     History  Substance Use Topics  . Smoking status: Current Every Day Smoker -- .5 years    Types: Cigarettes  . Smokeless tobacco: Not on file     Comment: smokes black and milds  . Alcohol Use: Yes     Comment: occasionally    Allergies:  Allergies  Allergen Reactions  . Other     All Antibiotics cause severe vaginal yeast infections  . Penicillins Hives, Itching and Swelling  . Shrimp (Shellfish Allergy) Hives    Prescriptions prior to admission  Medication Sig Dispense Refill  . azithromycin (ZITHROMAX Z-PAK) 250 MG tablet 2 po day one, then 1 daily x 4 days  5 tablet  0  . Homeopathic Products (ZICAM COLD REMEDY) TBDP Take 2 tablets by mouth 2 (two) times daily as needed (for cold symptoms).      Marland Kitchen HYDROcodone-acetaminophen (HYCET) 7.5-325 mg/15 ml solution Take 15 mLs by mouth every 8 (eight) hours as needed for pain.  120 mL  0  . Multiple Vitamins-Minerals (ZINC PO) Take 1  tablet by mouth daily.      . predniSONE (DELTASONE) 20 MG tablet Take 2 tablets (40 mg total) by mouth daily.  10 tablet  0    Review of Systems  Constitutional: Negative for fever.  Eyes: Negative for blurred vision.  Respiratory: Negative for shortness of breath.   Cardiovascular: Negative for chest pain.  Gastrointestinal: Negative for nausea, vomiting, abdominal pain, diarrhea and constipation.  Genitourinary: Negative for dysuria, urgency and frequency.  Musculoskeletal: Negative for myalgias.  Neurological: Negative for dizziness and headaches.   Physical Exam   Blood pressure 120/65, pulse 101, temperature 98.6 F (37 C), temperature source Oral, resp. rate 18, last menstrual period 08/22/2012, SpO2 99.00%.  Physical Exam  Nursing note and vitals reviewed. Constitutional: She is oriented to person, place, and time. She appears well-developed and well-nourished. No distress.  Cardiovascular: Normal rate.   Respiratory: Effort normal.  GI: Soft. There is no tenderness.  Genitourinary:   External: numerous small boils to her external genitalia and thighs Vagina: small amount of blood Cervix: pink, smooth Uterus: NSSC Adnexa: NT   Neurological: She is alert and oriented to person, place,  and time.  Skin: Skin is warm and dry.  Psychiatric: She has a normal mood and affect.    MAU Course  Procedures  Results for orders placed during the hospital encounter of 08/24/12 (from the past 24 hour(s))  URINALYSIS, ROUTINE W REFLEX MICROSCOPIC     Status: Abnormal   Collection Time    08/24/12 11:45 PM      Result Value Range   Color, Urine YELLOW  YELLOW   APPearance CLEAR  CLEAR   Specific Gravity, Urine >1.030 (*) 1.005 - 1.030   pH 6.0  5.0 - 8.0   Glucose, UA NEGATIVE  NEGATIVE mg/dL   Hgb urine dipstick LARGE (*) NEGATIVE   Bilirubin Urine NEGATIVE  NEGATIVE   Ketones, ur NEGATIVE  NEGATIVE mg/dL   Protein, ur NEGATIVE  NEGATIVE mg/dL   Urobilinogen, UA 1.0  0.0  - 1.0 mg/dL   Nitrite NEGATIVE  NEGATIVE   Leukocytes, UA NEGATIVE  NEGATIVE  URINE MICROSCOPIC-ADD ON     Status: None   Collection Time    08/24/12 11:45 PM      Result Value Range   Squamous Epithelial / LPF RARE  RARE   WBC, UA 0-2  <3 WBC/hpf   RBC / HPF 11-20  <3 RBC/hpf   Bacteria, UA RARE  RARE   Urine-Other MUCOUS PRESENT    POCT PREGNANCY, URINE     Status: None   Collection Time    08/24/12 11:50 PM      Result Value Range   Preg Test, Ur NEGATIVE  NEGATIVE  WET PREP, GENITAL     Status: Abnormal   Collection Time    08/25/12 12:07 AM      Result Value Range   Yeast Wet Prep HPF POC NONE SEEN  NONE SEEN   Trich, Wet Prep NONE SEEN  NONE SEEN   Clue Cells Wet Prep HPF POC NONE SEEN  NONE SEEN   WBC, Wet Prep HPF POC FEW (*) NONE SEEN  CBC     Status: Abnormal   Collection Time    08/25/12 12:15 AM      Result Value Range   WBC 8.5  4.0 - 10.5 K/uL   RBC 3.80 (*) 3.87 - 5.11 MIL/uL   Hemoglobin 9.3 (*) 12.0 - 15.0 g/dL   HCT 16.1 (*) 09.6 - 04.5 %   MCV 79.7  78.0 - 100.0 fL   MCH 24.5 (*) 26.0 - 34.0 pg   MCHC 30.7  30.0 - 36.0 g/dL   RDW 40.9 (*) 81.1 - 91.4 %   Platelets 417 (*) 150 - 400 K/uL     Assessment and Plan   1. Menorrhagia    Rx: ferrous gluconate TID #90 with 1 rf Keep a menstrual log FU with the health department as needed  Tawnya Crook 08/24/2012, 11:59 PM

## 2012-08-24 NOTE — MAU Note (Signed)
Pt started period on 5/19 and today began to pass clots. States some back pain but not abdominal pain.

## 2012-08-25 DIAGNOSIS — N920 Excessive and frequent menstruation with regular cycle: Secondary | ICD-10-CM

## 2012-08-25 LAB — WET PREP, GENITAL
Clue Cells Wet Prep HPF POC: NONE SEEN
Trich, Wet Prep: NONE SEEN
Yeast Wet Prep HPF POC: NONE SEEN

## 2012-08-25 LAB — URINALYSIS, ROUTINE W REFLEX MICROSCOPIC
Bilirubin Urine: NEGATIVE
Protein, ur: NEGATIVE mg/dL
Urobilinogen, UA: 1 mg/dL (ref 0.0–1.0)

## 2012-08-25 LAB — CBC
Hemoglobin: 9.3 g/dL — ABNORMAL LOW (ref 12.0–15.0)
MCH: 24.5 pg — ABNORMAL LOW (ref 26.0–34.0)
Platelets: 417 10*3/uL — ABNORMAL HIGH (ref 150–400)
RBC: 3.8 MIL/uL — ABNORMAL LOW (ref 3.87–5.11)

## 2012-08-25 LAB — URINE MICROSCOPIC-ADD ON

## 2012-08-25 MED ORDER — FERROUS GLUCONATE 216 MG PO TABS: 216.0000 mg | ORAL_TABLET | Freq: Three times a day (TID) | ORAL | Status: DC

## 2012-08-25 NOTE — MAU Provider Note (Signed)
Attestation of Attending Supervision of Advanced Practitioner: Evaluation and management procedures were performed by the PA/NP/CNM/OB Fellow under my supervision/collaboration. Chart reviewed and agree with management and plan.  Kristianna Saperstein V 08/25/2012 3:30 PM

## 2012-08-26 LAB — GC/CHLAMYDIA PROBE AMP: CT Probe RNA: POSITIVE — AB

## 2012-10-24 ENCOUNTER — Encounter (HOSPITAL_COMMUNITY): Payer: Self-pay | Admitting: Emergency Medicine

## 2012-10-24 ENCOUNTER — Emergency Department (HOSPITAL_COMMUNITY)
Admission: EM | Admit: 2012-10-24 | Discharge: 2012-10-25 | Disposition: A | Discharge status: Home / Self Care | Payer: Self-pay | Attending: Emergency Medicine | Admitting: Emergency Medicine

## 2012-10-24 DIAGNOSIS — Z79899 Other long term (current) drug therapy: Secondary | ICD-10-CM | POA: Insufficient documentation

## 2012-10-24 DIAGNOSIS — R5381 Other malaise: Secondary | ICD-10-CM | POA: Insufficient documentation

## 2012-10-24 DIAGNOSIS — Z880 Allergy status to penicillin: Secondary | ICD-10-CM | POA: Insufficient documentation

## 2012-10-24 DIAGNOSIS — D509 Iron deficiency anemia, unspecified: Secondary | ICD-10-CM | POA: Insufficient documentation

## 2012-10-24 DIAGNOSIS — G8929 Other chronic pain: Secondary | ICD-10-CM | POA: Insufficient documentation

## 2012-10-24 DIAGNOSIS — R5383 Other fatigue: Secondary | ICD-10-CM | POA: Insufficient documentation

## 2012-10-24 DIAGNOSIS — L732 Hidradenitis suppurativa: Secondary | ICD-10-CM | POA: Insufficient documentation

## 2012-10-24 DIAGNOSIS — Z3202 Encounter for pregnancy test, result negative: Secondary | ICD-10-CM | POA: Insufficient documentation

## 2012-10-24 DIAGNOSIS — Z8679 Personal history of other diseases of the circulatory system: Secondary | ICD-10-CM | POA: Insufficient documentation

## 2012-10-24 DIAGNOSIS — R21 Rash and other nonspecific skin eruption: Secondary | ICD-10-CM | POA: Insufficient documentation

## 2012-10-24 DIAGNOSIS — F17200 Nicotine dependence, unspecified, uncomplicated: Secondary | ICD-10-CM | POA: Insufficient documentation

## 2012-10-24 LAB — BASIC METABOLIC PANEL
BUN: 10 mg/dL (ref 6–23)
CO2: 27 mEq/L (ref 19–32)
Chloride: 102 mEq/L (ref 96–112)
Creatinine, Ser: 0.77 mg/dL (ref 0.50–1.10)
GFR calc Af Amer: >90 mL/min (ref >90)
Glucose, Bld: 82 mg/dL (ref 70–99)
Potassium: 3.8 mEq/L (ref 3.5–5.1)

## 2012-10-24 LAB — URINE MICROSCOPIC-ADD ON

## 2012-10-24 LAB — URINALYSIS, ROUTINE W REFLEX MICROSCOPIC
Bilirubin Urine: NEGATIVE
Glucose, UA: NEGATIVE mg/dL
Ketones, ur: NEGATIVE mg/dL
Nitrite: NEGATIVE
Specific Gravity, Urine: 1.028 (ref 1.005–1.030)
pH: 6 (ref 5.0–8.0)

## 2012-10-24 LAB — CBC
HCT: 30.9 % — ABNORMAL LOW (ref 36.0–46.0)
Hemoglobin: 9.8 g/dL — ABNORMAL LOW (ref 12.0–15.0)
MCV: 73.6 fL — ABNORMAL LOW (ref 78.0–100.0)
RBC: 4.2 MIL/uL (ref 3.87–5.11)
RDW: 16.3 % — ABNORMAL HIGH (ref 11.5–15.5)
WBC: 8 10*3/uL (ref 4.0–10.5)

## 2012-10-24 MED ORDER — FERROUS SULFATE 325 (65 FE) MG PO TABS: 325.0000 mg | ORAL_TABLET | Freq: Two times a day (BID) | ORAL | Status: DC

## 2012-10-24 MED ORDER — DOXYCYCLINE HYCLATE 100 MG PO CAPS: 100.0000 mg | ORAL_CAPSULE | Freq: Two times a day (BID) | ORAL | Status: DC

## 2012-10-24 MED ORDER — DOXYCYCLINE HYCLATE 100 MG PO TABS
100.0000 mg | ORAL_TABLET | Freq: Once | ORAL | Status: DC
  Filled 2012-10-24: qty 1

## 2012-10-24 NOTE — ED Notes (Signed)
Nurse first rounds; Pt. sitting waiting area with no distress ,respirations unlabored , nurse explained delay /process and wait time to pt.

## 2012-10-24 NOTE — ED Notes (Signed)
Pt c/o dizziness and lethargy x 1 week; pt sts some HA

## 2012-10-25 NOTE — ED Notes (Addendum)
Pt refused to take her antibiotic prescription. Pt states meds does not work. Pt states she was on prednisone the last time she was here for the same thing and it worked for a short period of time. Patient wanted RN to talk to MD about giving her another prescription. MD states he will not and the one he gave her will help with her abscess.

## 2012-10-25 NOTE — ED Provider Notes (Signed)
History    CSN: [id number] Arrival date & time 10/24/12  Rickey Primus  First MD Initiated Contact with Patient 10/24/12 2127     Chief Complaint  Patient presents with  . Dizziness   (Consider location/radiation/quality/duration/timing/severity/associated sxs/prior Treatment) Patient is a 32 y.o. female presenting with weakness and rash.  Weakness This is a new problem. Episode onset: Pt has felt weak, lightheaded and dizzy for about a week. The problem occurs constantly. The problem has not changed since onset.Associated symptoms comments: None . Nothing aggravates the symptoms. Nothing relieves the symptoms. She has tried nothing for the symptoms.  Rash Pain location: She also has a rash in both armpits, and to a lesser extent in her groin area. Pain quality: aching   Pain radiates to:  Does not radiate Pain severity:  Mild Onset quality:  Gradual Duration: Recurring problem, has had on and off for years. Timing:  Intermittent Progression:  Unchanged Chronicity:  Chronic Relieved by:  Nothing Worsened by:  Nothing tried Ineffective treatments:  None tried Associated symptoms: no chills and no fever    Past Medical History  Diagnosis Date  . Allergy   . Anemia   . Recurrent boils   . Nearsightedness     wears glasses  . Chronic headache   . Arrhythmia   . WPW (Wolff-Parkinson-White syndrome)    Past Surgical History  Procedure Laterality Date  . Cystectomy      tonsils   Family History  Problem Relation Age of Onset  . Cancer Mother     breast, stomach  . Pulmonary embolism Mother     died of PE  . Diabetes Paternal Grandmother   . Heart disease Neg Hx   . Stroke Neg Hx    History  Substance Use Topics  . Smoking status: Current Every Day Smoker -- .5 years    Types: Cigarettes  . Smokeless tobacco: Not on file     Comment: smokes black and milds  . Alcohol Use: Yes     Comment: occasionally   OB History   Grav Para Term Preterm Abortions TAB SAB Ect Mult  Living   0              Review of Systems  Constitutional: Negative for fever and chills.  HENT: Negative.   Respiratory: Negative.   Cardiovascular:       Known WPW, not on medications.  Previously seen by Lewayne Bunting, M.D., electrophysiologist, who had placed her on low dose beta blocker.  Gastrointestinal: Negative.   Genitourinary: Negative.   Musculoskeletal: Negative.   Skin: Positive for rash.  Neurological: Positive for weakness.  Psychiatric/Behavioral: Negative.     Allergies  Other; Penicillins; and Shrimp  Home Medications   Current Outpatient Rx  Name  Route  Sig  Dispense  Refill  . doxycycline (VIBRAMYCIN) 100 MG capsule   Oral   Take 1 capsule (100 mg total) by mouth 2 (two) times daily.   20 capsule   0   . ferrous sulfate 325 (65 FE) MG tablet   Oral   Take 1 tablet (325 mg total) by mouth 2 (two) times daily after a meal.   60 tablet   0    BP 113/79  Pulse 81  Temp(Src) 97.2 F (36.2 C) (Oral)  Resp 18  SpO2 100% Physical Exam  Nursing note and vitals reviewed. Constitutional: She is oriented to person, place, and time.  Obese, well-appearing young woman, no distress.  HENT:  Head:  Normocephalic and atraumatic.  Right Ear: External ear normal.  Left Ear: External ear normal.  Mouth/Throat: Oropharynx is clear and moist.  Eyes: Conjunctivae and EOM are normal. Pupils are equal, round, and reactive to light.  Neck: Normal range of motion. Neck supple.  Cardiovascular: Normal rate, regular rhythm and normal heart sounds.   Pulmonary/Chest: Effort normal and breath sounds normal.  Abdominal: Soft. Bowel sounds are normal.  Musculoskeletal: Normal range of motion. She exhibits no edema and no tenderness.  Neurological: She is alert and oriented to person, place, and time.  No sensory or motor deficit.  Skin: Skin is warm and dry.  She has chronic scarring with some draining sinuses in both axillae.    Psychiatric: She has a normal mood  and affect. Her behavior is normal.    ED Course  Procedures (including critical care time)  Results for orders placed during the hospital encounter of 10/24/12  CBC      Result Value Range   WBC 8.0  4.0 - 10.5 K/uL   RBC 4.20  3.87 - 5.11 MIL/uL   Hemoglobin 9.8 (*) 12.0 - 15.0 g/dL   HCT 16.1 (*) 09.6 - 04.5 %   MCV 73.6 (*) 78.0 - 100.0 fL   MCH 23.3 (*) 26.0 - 34.0 pg   MCHC 31.7  30.0 - 36.0 g/dL   RDW 40.9 (*) 81.1 - 91.4 %   Platelets 500 (*) 150 - 400 K/uL  BASIC METABOLIC PANEL      Result Value Range   Sodium 135  135 - 145 mEq/L   Potassium 3.8  3.5 - 5.1 mEq/L   Chloride 102  96 - 112 mEq/L   CO2 27  19 - 32 mEq/L   Glucose, Bld 82  70 - 99 mg/dL   BUN 10  6 - 23 mg/dL   Creatinine, Ser 7.82  0.50 - 1.10 mg/dL   Calcium 9.0  8.4 - 95.6 mg/dL   GFR calc non Af Amer >90  >90 mL/min   GFR calc Af Amer >90  >90 mL/min  URINALYSIS, ROUTINE W REFLEX MICROSCOPIC      Result Value Range   Color, Urine YELLOW  YELLOW   APPearance CLEAR  CLEAR   Specific Gravity, Urine 1.028  1.005 - 1.030   pH 6.0  5.0 - 8.0   Glucose, UA NEGATIVE  NEGATIVE mg/dL   Hgb urine dipstick NEGATIVE  NEGATIVE   Bilirubin Urine NEGATIVE  NEGATIVE   Ketones, ur NEGATIVE  NEGATIVE mg/dL   Protein, ur NEGATIVE  NEGATIVE mg/dL   Urobilinogen, UA 0.2  0.0 - 1.0 mg/dL   Nitrite NEGATIVE  NEGATIVE   Leukocytes, UA TRACE (*) NEGATIVE  URINE MICROSCOPIC-ADD ON      Result Value Range   Squamous Epithelial / LPF FEW (*) RARE   WBC, UA 0-2  <3 WBC/hpf   Bacteria, UA FEW (*) RARE   Urine-Other MUCOUS PRESENT    POCT PREGNANCY, URINE      Result Value Range   Preg Test, Ur NEGATIVE  NEGATIVE    Date: 10/24/2012  Rate: 102  Rhythm: sinus tachycardia  QRS Axis: left  Intervals: QT prolonged QRS:  Delta waves--pt has WPW  ST/T Wave abnormalities: normal  Conduction Disutrbances:none  Narrative Interpretation: Abnormal EKG  Old EKG Reviewed: unchanged   Course in ED:  Pt was seen and had  physical examination.  It showed hidradenitis suppurativa in her axillae.  Rx with doxycycline, referred to Dr.  Derrell Lolling, general surgeon on call, to evaluate for excision of affected area.  Lab workup showed iron deficiency anemia.  Rx with FeSO4 325 mg bid.      No results found. 1. Hidradenitis suppurativa   2. Iron deficiency anemia      Carleene Cooper III, MD 10/25/12 1239

## 2012-12-16 ENCOUNTER — Encounter (HOSPITAL_COMMUNITY): Payer: Self-pay | Admitting: Cardiology

## 2012-12-16 ENCOUNTER — Observation Stay (HOSPITAL_COMMUNITY)
Admission: EM | Admit: 2012-12-16 | Discharge: 2012-12-17 | Disposition: A | Discharge status: Home / Self Care | Payer: Self-pay | Attending: Internal Medicine | Admitting: Internal Medicine

## 2012-12-16 DIAGNOSIS — I456 Pre-excitation syndrome: Secondary | ICD-10-CM

## 2012-12-16 DIAGNOSIS — D649 Anemia, unspecified: Secondary | ICD-10-CM

## 2012-12-16 DIAGNOSIS — I951 Orthostatic hypotension: Secondary | ICD-10-CM

## 2012-12-16 DIAGNOSIS — L732 Hidradenitis suppurativa: Secondary | ICD-10-CM

## 2012-12-16 DIAGNOSIS — K922 Gastrointestinal hemorrhage, unspecified: Secondary | ICD-10-CM

## 2012-12-16 DIAGNOSIS — Z800 Family history of malignant neoplasm of digestive organs: Secondary | ICD-10-CM | POA: Insufficient documentation

## 2012-12-16 DIAGNOSIS — D509 Iron deficiency anemia, unspecified: Secondary | ICD-10-CM

## 2012-12-16 DIAGNOSIS — K921 Melena: Principal | ICD-10-CM

## 2012-12-16 LAB — OCCULT BLOOD, POC DEVICE: Fecal Occult Bld: POSITIVE — AB

## 2012-12-16 LAB — COMPREHENSIVE METABOLIC PANEL
Albumin: 3 g/dL — ABNORMAL LOW (ref 3.5–5.2)
Alkaline Phosphatase: 61 U/L (ref 39–117)
BUN: 8 mg/dL (ref 6–23)
CO2: 23 mEq/L (ref 19–32)
Chloride: 102 mEq/L (ref 96–112)
Creatinine, Ser: 0.61 mg/dL (ref 0.50–1.10)
GFR calc Af Amer: >90 mL/min (ref >90)
GFR calc non Af Amer: >90 mL/min (ref >90)
Glucose, Bld: 105 mg/dL — ABNORMAL HIGH (ref 70–99)
Potassium: 3.3 mEq/L — ABNORMAL LOW (ref 3.5–5.1)
Total Bilirubin: 0.2 mg/dL — ABNORMAL LOW (ref 0.3–1.2)

## 2012-12-16 LAB — CBC
HCT: 28.1 % — ABNORMAL LOW (ref 36.0–46.0)
Hemoglobin: 8.9 g/dL — ABNORMAL LOW (ref 12.0–15.0)
MCH: 22.1 pg — ABNORMAL LOW (ref 26.0–34.0)
MCHC: 31.7 g/dL (ref 30.0–36.0)
MCV: 69.9 fL — ABNORMAL LOW (ref 78.0–100.0)
Platelets: 535 K/uL — ABNORMAL HIGH (ref 150–400)
RBC: 4.02 MIL/uL (ref 3.87–5.11)
RDW: 17.5 % — ABNORMAL HIGH (ref 11.5–15.5)
WBC: 7.3 10*3/uL (ref 4.0–10.5)

## 2012-12-16 LAB — TYPE AND SCREEN
ABO/RH(D): O POS
Antibody Screen: NEGATIVE

## 2012-12-16 LAB — COMPREHENSIVE METABOLIC PANEL WITH GFR
ALT: 6 U/L (ref 0–35)
AST: 12 U/L (ref 0–37)
Calcium: 8.8 mg/dL (ref 8.4–10.5)
Sodium: 135 meq/L (ref 135–145)
Total Protein: 8.9 g/dL — ABNORMAL HIGH (ref 6.0–8.3)

## 2012-12-16 LAB — RETICULOCYTES: Retic Ct Pct: 1.4 % (ref 0.4–3.1)

## 2012-12-16 MED ORDER — PANTOPRAZOLE SODIUM 40 MG IV SOLR
40.0000 mg | Freq: Once | INTRAVENOUS | Status: AC
  Administered 2012-12-16: 40 mg via INTRAVENOUS
  Filled 2012-12-16: qty 40

## 2012-12-16 MED ORDER — ALBUTEROL SULFATE (5 MG/ML) 0.5% IN NEBU: 2.5000 mg | INHALATION_SOLUTION | RESPIRATORY_TRACT | Status: DC | PRN

## 2012-12-16 MED ORDER — ONDANSETRON HCL 4 MG/2ML IJ SOLN: 4.0000 mg | Freq: Four times a day (QID) | INTRAMUSCULAR | Status: DC | PRN

## 2012-12-16 MED ORDER — SODIUM CHLORIDE 0.9 % IV BOLUS (SEPSIS)
1000.0000 mL | Freq: Once | INTRAVENOUS | Status: AC
  Administered 2012-12-16: 1000 mL via INTRAVENOUS

## 2012-12-16 MED ORDER — ACETAMINOPHEN 650 MG RE SUPP: 650.0000 mg | Freq: Four times a day (QID) | RECTAL | Status: DC | PRN

## 2012-12-16 MED ORDER — ALUM & MAG HYDROXIDE-SIMETH 200-200-20 MG/5ML PO SUSP: 30.0000 mL | Freq: Four times a day (QID) | ORAL | Status: DC | PRN

## 2012-12-16 MED ORDER — ACETAMINOPHEN 325 MG PO TABS: 650.0000 mg | ORAL_TABLET | Freq: Four times a day (QID) | ORAL | Status: DC | PRN

## 2012-12-16 MED ORDER — SODIUM CHLORIDE 0.9 % IV SOLN
INTRAVENOUS | Status: DC
  Administered 2012-12-16: 23:00:00 via INTRAVENOUS

## 2012-12-16 MED ORDER — FERROUS SULFATE 325 (65 FE) MG PO TABS
325.0000 mg | ORAL_TABLET | Freq: Every day | ORAL | Status: DC
  Filled 2012-12-16: qty 1

## 2012-12-16 MED ORDER — HYDROCORTISONE ACETATE 25 MG RE SUPP
25.0000 mg | Freq: Two times a day (BID) | RECTAL | Status: DC
  Administered 2012-12-16: 25 mg via RECTAL
  Filled 2012-12-16 (×3): qty 1

## 2012-12-16 MED ORDER — POLYETHYLENE GLYCOL 3350 17 G PO PACK
17.0000 g | PACK | Freq: Every day | ORAL | Status: DC
  Administered 2012-12-16: 17 g via ORAL
  Filled 2012-12-16 (×2): qty 1

## 2012-12-16 MED ORDER — HYDROCODONE-ACETAMINOPHEN 5-325 MG PO TABS: 1.0000 | ORAL_TABLET | ORAL | Status: DC | PRN

## 2012-12-16 MED ORDER — ONDANSETRON HCL 4 MG PO TABS: 4.0000 mg | ORAL_TABLET | Freq: Four times a day (QID) | ORAL | Status: DC | PRN

## 2012-12-16 MED ORDER — DOCUSATE SODIUM 100 MG PO CAPS
100.0000 mg | ORAL_CAPSULE | Freq: Every day | ORAL | Status: DC
  Filled 2012-12-16: qty 1

## 2012-12-16 MED ORDER — SODIUM CHLORIDE 0.9 % IJ SOLN: 3.0000 mL | Freq: Two times a day (BID) | INTRAMUSCULAR | Status: DC

## 2012-12-16 NOTE — ED Notes (Signed)
Attempted to call report x 1  

## 2012-12-16 NOTE — ED Notes (Signed)
Admitting MD at bedside.

## 2012-12-16 NOTE — ED Notes (Signed)
Pt reports she has not been feeling well over the past couple of days and been feeling weak. States that her hands have felt weak and hurt when she grasps things. Reports this morning she noticed bright red blood in the toilet. Denies any abd pain, reports hx of anemia. Reports she has had pain with BM.

## 2012-12-16 NOTE — H&P (Signed)
PATIENT DETAILS Name: Amanda Davenport Age: 32 y.o. Sex: female Date of Birth: [birth year] Admit Date: 12/16/2012 PCP:No PCP Per Patient   CHIEF COMPLAINT:  Hematochezia X 1 episode this am  HPI: Amanda Davenport is a 32 y.o. female with a Past Medical History of WPW, Hidradenitis Suppurativa, chronic Fe Def anemia who presents today with the above noted complaint.Per patient she had a large bloody bowel movement this am. She has Chronic gluteal hidradenitis, and usually upon wiping always has some blood in the tissue. However this am, she noted a large amount of blood with her stools. This was just one episode and has not recurred since then. She presented to the ED, was noted to be slightly more anemic than usual, was also orthostatic. I was then asked to admit this patient for further evaluation and treatment.  During my evaluation, she denied any complaints. She has chronic Hidradenitis in the gluteal area, which she just manages by a dry dressing. She denies any active fever, active discharge from the gluteal area.  She is now being admitted for 23 hour observation overnight   ALLERGIES:   Allergies  Allergen Reactions  . Other     All Antibiotics cause severe vaginal yeast infections  . Penicillins Hives, Itching and Swelling  . Shrimp [Shellfish Allergy] Hives    PAST MEDICAL HISTORY: Past Medical History  Diagnosis Date  . Allergy   . Anemia   . Recurrent boils   . Nearsightedness     wears glasses  . Chronic headache   . Arrhythmia   . WPW (Wolff-Parkinson-White syndrome)     PAST SURGICAL HISTORY: Past Surgical History  Procedure Laterality Date  . Cystectomy      tonsils    MEDICATIONS AT HOME: Prior to Admission medications   Medication Sig Start Date End Date Taking? Authorizing Provider  Aspirin-Acetaminophen-Caffeine (GOODY HEADACHE PO) Take 2 packets by mouth once as needed (headache).   Yes Historical Provider, MD  Aspirin-Caffeine (BAYER BACK & BODY  PAIN EX ST) 500-32.5 MG TABS Take 2 tablets by mouth 2 (two) times daily as needed (pain).   Yes Historical Provider, MD  docusate sodium (COLACE) 100 MG capsule Take 100 mg by mouth daily as needed for constipation.   Yes Historical Provider, MD  ferrous sulfate 325 (65 FE) MG tablet Take 325 mg by mouth daily.   Yes Historical Provider, MD    FAMILY HISTORY: Family History  Problem Relation Age of Onset  . Cancer Mother     breast, stomach  . Pulmonary embolism Mother     died of PE  . Diabetes Paternal Grandmother   . Heart disease Neg Hx   . Stroke Neg Hx     SOCIAL HISTORY:  reports that she has quit smoking. Her smoking use included Cigarettes. She smoked 0.00 packs per day for .5 years. She does not have any smokeless tobacco history on file. She reports that  drinks alcohol. She reports that she does not use illicit drugs.  REVIEW OF SYSTEMS:  Constitutional:   No  weight loss, night sweats,  Fevers, chills, fatigue.  HEENT:    No headaches, Difficulty swallowing,Tooth/dental problems,Sore throat,  No sneezing, itching, ear ache, nasal congestion, post nasal drip,   Cardio-vascular: No chest pain,  Orthopnea, PND, swelling in lower extremities, anasarca,  dizziness, palpitations  GI:  No heartburn, indigestion, abdominal pain, nausea, vomiting, diarrhea, change in bowel habits, loss of appetite  Resp: No shortness of breath with exertion  or at rest.  No excess mucus, no productive cough, No non-productive cough,  No coughing up of blood.No change in color of mucus.No wheezing.No chest wall deformity  Skin:  no rash or lesions.  GU:  no dysuria, change in color of urine, no urgency or frequency.  No flank pain.  Musculoskeletal: No joint pain or swelling.  No decreased range of motion.  No back pain.  Psych: No change in mood or affect. No depression or anxiety.  No memory loss.   PHYSICAL EXAM: Blood pressure 118/64, pulse 88, temperature 98.4 F (36.9 C),  resp. rate 18, height 5\' 10"  (1.778 m), weight 121.564 kg (268 lb), SpO2 93.00%.  General appearance :Awake, alert, not in any distress. Speech Clear. Not toxic Looking HEENT: Atraumatic and Normocephalic, pupils equally reactive to light and accomodation Neck: supple, no JVD. No cervical lymphadenopathy.  Chest:Good air entry bilaterally, no added sounds  CVS: S1 S2 regular, no murmurs.  Abdomen: Bowel sounds present, Non tender and not distended with no gaurding, rigidity or rebound.Very small lesions consistent with hidradenitis in the gluteal cleft-none of them appear big enough for I&D, No active discharge seen Extremities: B/L Lower Ext shows no edema, both legs are warm to touch Neurology: Awake alert, and oriented X 3, CN II-XII intact, Non focal Skin:No Rash Wounds:N/A  LABS ON ADMISSION:   Recent Labs  12/16/12 1255  NA 135  K 3.3*  CL 102  CO2 23  GLUCOSE 105*  BUN 8  CREATININE 0.61  CALCIUM 8.8    Recent Labs  12/16/12 1255  AST 12  ALT 6  ALKPHOS 61  BILITOT 0.2*  PROT 8.9*  ALBUMIN 3.0*   No results found for this basename: LIPASE, AMYLASE,  in the last 72 hours  Recent Labs  12/16/12 1255  WBC 7.3  HGB 8.9*  HCT 28.1*  MCV 69.9*  PLT 535*   No results found for this basename: CKTOTAL, CKMB, CKMBINDEX, TROPONINI,  in the last 72 hours No results found for this basename: DDIMER,  in the last 72 hours No components found with this basename: POCBNP,    RADIOLOGIC STUDIES ON ADMISSION: No results found.   ASSESSMENT AND PLAN: Present on Admission:  . Hematochezia -suspect this is 2/2 hemorrhoids-no external hemorrhoids seen -admit overnight for observation -monitor for further bleed, if she rebleeds-would then do frequent H/H, otherwise will just check CBC in am. Will start on Miralax, Annusol. If no bleeding overnight-she should be stable for discharge tomorrow, with close outpatient follow up. If she rebleeds then may need further GI  consultation for endoscopic evaluation.  . Anemia -suspect this is mostly chronic Fe def-she denies hx of menorrhagia -Hb close to usual baseline -c/w Fe supplementation -check Anemia panel  . Hidradenitis suppurativa -stable and chronic -no role for antibiotics or I&D at this time  . Wolff-Parkinson-White (WPW) syndrome with preexcitation -monitor in Telemetry  Further plan will depend as patient's clinical course evolves and further radiologic and laboratory data become available. Patient will be monitored closely.  DVT Prophylaxis: SCD's  Code Status: Full Code  Total time spent for admission equals 45 minutes.  [facility] Pager [phone]  If 7PM-7AM, please contact night-coverage [email or web address] Password [facility] 12/16/2012, 5:52 PM

## 2012-12-16 NOTE — ED Provider Notes (Signed)
CSN: [id number]     Arrival date & time 12/16/12  1231 History   First MD Initiated Contact with Patient 12/16/12 1425     Chief Complaint  Patient presents with  . Rectal Bleeding   (Consider location/radiation/quality/duration/timing/severity/associated sxs/prior Treatment) HPI Comments: Pt reports has had transfusion in the past for chronic anemia.  She has hydradenitis and has had multiple abscesses in the past.  She has several small ones along gluteal cleft and she has been oozing from those for quite some time, which she was used to.  Today though after taking stool softeners, had a normal feeling stool, but had bright red blood associated with it.    Patient is a 32 y.o. female presenting with hematochezia. The history is provided by the patient.  Rectal Bleeding Quality:  Bright red Amount:  Moderate Chronicity:  New Context: constipation and defecation   Context: not anal fissures, not hemorrhoids and not rectal pain   Context comment:  Pt has multiple small abscesses along gluteal cleft Associated symptoms: light-headedness   Associated symptoms: no abdominal pain, no dizziness, no loss of consciousness and no vomiting   Risk factors: no anticoagulant use     Past Medical History  Diagnosis Date  . Allergy   . Anemia   . Recurrent boils   . Nearsightedness     wears glasses  . Chronic headache   . Arrhythmia   . WPW (Wolff-Parkinson-White syndrome)    Past Surgical History  Procedure Laterality Date  . Cystectomy      tonsils   Family History  Problem Relation Age of Onset  . Cancer Mother     breast, stomach  . Pulmonary embolism Mother     died of PE  . Diabetes Paternal Grandmother   . Heart disease Neg Hx   . Stroke Neg Hx    History  Substance Use Topics  . Smoking status: Former Smoker -- .5 years    Types: Cigarettes  . Smokeless tobacco: Not on file     Comment: smokes black and milds  . Alcohol Use: Yes     Comment: occasionally   OB  History   Grav Para Term Preterm Abortions TAB SAB Ect Mult Living   0              Review of Systems  Constitutional: Positive for fatigue. Negative for appetite change.  Gastrointestinal: Positive for constipation, blood in stool and hematochezia. Negative for nausea, vomiting, abdominal pain, diarrhea and rectal pain.  Musculoskeletal: Negative for back pain.  Skin: Positive for wound.  Neurological: Positive for weakness and light-headedness. Negative for dizziness and loss of consciousness.  All other systems reviewed and are negative.    Allergies  Other; Penicillins; and Shrimp  Home Medications   Current Outpatient Rx  Name  Route  Sig  Dispense  Refill  . Aspirin-Acetaminophen-Caffeine (GOODY HEADACHE PO)   Oral   Take 2 packets by mouth once as needed (headache).         . Aspirin-Caffeine (BAYER BACK & BODY PAIN EX ST) 500-32.5 MG TABS   Oral   Take 2 tablets by mouth 2 (two) times daily as needed (pain).         Marland Kitchen docusate sodium (COLACE) 100 MG capsule   Oral   Take 100 mg by mouth daily as needed for constipation.         . ferrous sulfate 325 (65 FE) MG tablet   Oral   Take 325  mg by mouth daily.          BP 118/64  Pulse 88  Temp(Src) 98.4 F (36.9 C)  Resp 18  Ht 5\' 10"  (1.778 m)  Wt 268 lb (121.564 kg)  BMI 38.45 kg/m2  SpO2 93% Physical Exam  Nursing note and vitals reviewed. Constitutional: She is oriented to person, place, and time. She appears well-developed and well-nourished. No distress.  HENT:  Head: Normocephalic and atraumatic.  Eyes: EOM are normal.  Neck: Normal range of motion. Neck supple.  Cardiovascular: Normal rate, regular rhythm and intact distal pulses.   Pulmonary/Chest: Effort normal.  Abdominal: Soft. She exhibits no distension. There is no tenderness. There is no rebound.  Genitourinary: Rectal exam shows no tenderness and anal tone normal. Guaiac positive stool.  Chaperone present.  Multiple small abscesses,  some draining minute amounts of fluid from them bilateral gluteal cleft.  Stool was normal color  Neurological: She is alert and oriented to person, place, and time. She exhibits normal muscle tone.  Skin: Skin is warm. No rash noted. No pallor.  Psychiatric: She has a normal mood and affect.    ED Course  Procedures (including critical care time) Labs Review Labs Reviewed  CBC - Abnormal; Notable for the following:    Hemoglobin 8.9 (*)    HCT 28.1 (*)    MCV 69.9 (*)    MCH 22.1 (*)    RDW 17.5 (*)    Platelets 535 (*)    All other components within normal limits  COMPREHENSIVE METABOLIC PANEL - Abnormal; Notable for the following:    Potassium 3.3 (*)    Glucose, Bld 105 (*)    Total Protein 8.9 (*)    Albumin 3.0 (*)    Total Bilirubin 0.2 (*)    All other components within normal limits  OCCULT BLOOD, POC DEVICE - Abnormal; Notable for the following:    Fecal Occult Bld POSITIVE (*)    All other components within normal limits   Imaging Review No results found.  ra sat is 93% and I interpret to be adequate  5:05 PM Pt with lower GI bleed, anemia, subjective light headedness, + hemoccult.  No abd pain, will consult [facility] to admit for serial Hgb and observation for lower GI bleed, will continue IVF's in the ED.    MDM   1. Lower GI bleed   2. Hidradenitis suppurativa   3. Orthostatic hypotension      Pt with mild orthostasis, anemic to less than 9, was at 7.4 last year requiring transfusion for symptomatic anemia.  Pt is not tachycardic, some subjective symptoms of orthostasis and fatigue.  No N/V.  No obv hemorrhoids on exam, although with h/o gluteal abscesses, concern for further infectious or rheum issues is possible.      Gavin Pound. Toniesha Zellner, MD 12/16/12 1708

## 2012-12-16 NOTE — ED Notes (Signed)
MD at bedside. 

## 2012-12-16 NOTE — ED Notes (Signed)
Pt reports generalized weakness x 1 month that is progressively worsening. States she has noted blood on toilet paper x 1 month, but this AM noticed bright red in toilet. Denies abd pain, n,v, blurred vision.

## 2012-12-17 ENCOUNTER — Encounter (HOSPITAL_COMMUNITY): Payer: Self-pay | Admitting: *Deleted

## 2012-12-17 DIAGNOSIS — I456 Pre-excitation syndrome: Secondary | ICD-10-CM

## 2012-12-17 DIAGNOSIS — I951 Orthostatic hypotension: Secondary | ICD-10-CM

## 2012-12-17 DIAGNOSIS — K922 Gastrointestinal hemorrhage, unspecified: Secondary | ICD-10-CM

## 2012-12-17 LAB — VITAMIN B12: Vitamin B-12: 271 pg/mL (ref 211–911)

## 2012-12-17 LAB — IRON AND TIBC
Iron: 20 ug/dL — ABNORMAL LOW (ref 42–135)
Saturation Ratios: 7 % — ABNORMAL LOW (ref 20–55)
TIBC: 280 ug/dL (ref 250–470)

## 2012-12-17 LAB — COMPREHENSIVE METABOLIC PANEL
Albumin: 2.6 g/dL — ABNORMAL LOW (ref 3.5–5.2)
Alkaline Phosphatase: 57 U/L (ref 39–117)
BUN: 8 mg/dL (ref 6–23)
Calcium: 8.5 mg/dL (ref 8.4–10.5)
GFR calc Af Amer: >90 mL/min (ref >90)
Potassium: 3.8 mEq/L (ref 3.5–5.1)
Sodium: 138 mEq/L (ref 135–145)
Total Protein: 8.1 g/dL (ref 6.0–8.3)

## 2012-12-17 LAB — CBC
HCT: 26.9 % — ABNORMAL LOW (ref 36.0–46.0)
MCH: 21.5 pg — ABNORMAL LOW (ref 26.0–34.0)
MCHC: 30.5 g/dL (ref 30.0–36.0)
RDW: 18 % — ABNORMAL HIGH (ref 11.5–15.5)

## 2012-12-17 MED ORDER — POLYETHYLENE GLYCOL 3350 17 G PO PACK: 17.0000 g | PACK | Freq: Every day | ORAL | Status: DC

## 2012-12-17 MED ORDER — FERROUS SULFATE 325 (65 FE) MG PO TABS: 325.0000 mg | ORAL_TABLET | Freq: Three times a day (TID) | ORAL | Status: DC

## 2012-12-17 MED ORDER — FERROUS SULFATE 325 (65 FE) MG PO TABS
325.0000 mg | ORAL_TABLET | Freq: Three times a day (TID) | ORAL | Status: DC
  Filled 2012-12-17 (×3): qty 1

## 2012-12-17 MED ORDER — HYDROCORTISONE ACETATE 25 MG RE SUPP: 25.0000 mg | Freq: Two times a day (BID) | RECTAL | Status: DC

## 2012-12-17 NOTE — Discharge Summary (Signed)
Physician Discharge Summary  Amanda Davenport YNW:[id number] DOB: 1980-06-02 DOA: 12/16/2012  PCP: No PCP Per Patient  Admit date: 12/16/2012 Discharge date: 12/17/2012  Time spent: >30 minutes  Recommendations for Outpatient Follow-up:  1. CBC to follow Hgb trend  Discharge Diagnoses:  Hematochezia Hidradenitis suppurativa Iron deficiency anemia Wolff-Parkinson-White (WPW) syndrome First degree relative with colon cancer (Mother in her 65's)   Discharge Condition: stable and improved. Will discharge home with arranged follow up for colonoscopy next week with [facility]  Diet recommendation: increase fiber intake in her diet  Filed Weights   12/16/12 1238 12/16/12 2022 12/17/12 0354  Weight: 121.564 kg (268 lb) 121.836 kg (268 lb 9.6 oz) 123.3 kg (271 lb 13.2 oz)    History of present illness:  32 y.o. female with a Past Medical History of WPW, Hidradenitis Suppurativa, chronic Fe Def anemia who presents today with the above noted complaint.Per patient she had a large bloody bowel movement this am. She has Chronic gluteal hidradenitis, and usually upon wiping always has some blood in the tissue. However this am, she noted a large amount of blood with her stools. This was just one episode and has not recurred since then. She presented to the ED, was noted to be slightly more anemic than usual, was also orthostatic. I was then asked to admit this patient for further evaluation and treatment.  During my evaluation, she denied any complaints. She has chronic Hidradenitis in the gluteal area, which she just manages by a dry dressing. She denies any active fever, active discharge from the gluteal area.    Hospital Course:  . Hematochezia  -suspect this is 2/2 internal hemorrhoids most likely; but patient with hx of first degree relative (mother) with colon cancer in her 40's -no further bleeding overnight -Hgb remains stable -patient asymptomatic. Will arrange outpatient  colonoscopy next week with [facility] -advise to stop goody powder and Excedrin -ferrous sulfate supplementation -Anusol BID and advise to increase fiber in her diet  . Iron deficiency Anemia  -will start ferrous sulfate TID -colonoscopy in outpatient setting for further evaluation and treatment.  . Hidradenitis suppurativa  -stable and chronic  -no role for antibiotics or I&D at this time  -follow up as an outpatient with establishment of PCP and if needed set up wound care visits  . Wolff-Parkinson-White (WPW) syndrome with preexcitation  -no abnormalities seen on telemetry -patient w/o CP, palpitation or SOB  *Rest of medical problems remains stable.  Procedures:  None   Consultations:  GI curbside ([facility]; will see her next week in outpatient setting for colonoscopy)  Discharge Exam: Filed Vitals:   12/17/12 0354  BP: 95/65  Pulse: 86  Temp: 98.7 F (37.1 C)  Resp: 20    General: NAD, afebrile, no nausea, no vomiting and no abd pain Cardiovascular: S1 and S2, no rubs or gallops Respiratory: CTA bilaterally Abdominal: no tenderness, soft, no distension; positive BS Extremities: no edema, no cyanosis Neuro: non focal deficit  Discharge Instructions  Discharge Orders   Future Orders Complete By Expires   Discharge instructions  As directed    Comments:     Keep yourself well hydrated Increase fiber ingestion in your diet Take medications as prescribed [facility] service will contact you on Monday 12/20/12 with appointment details for colonoscopy next week       Medication List    STOP taking these medications       BAYER BACK & BODY PAIN EX ST 500-32.5 MG  Tabs  Generic drug:  Aspirin-Caffeine     GOODY HEADACHE PO      TAKE these medications       docusate sodium 100 MG capsule  Commonly known as:  COLACE  Take 100 mg by mouth daily as needed for constipation.     ferrous sulfate 325 (65 FE) MG tablet  Take  1 tablet (325 mg total) by mouth 3 (three) times daily with meals.     hydrocortisone 25 MG suppository  Commonly known as:  ANUSOL-HC  Place 1 suppository (25 mg total) rectally 2 (two) times daily.     polyethylene glycol packet  Commonly known as:  MIRALAX / GLYCOLAX  Take 17 g by mouth daily.       Allergies  Allergen Reactions  . Other     All Antibiotics cause severe vaginal yeast infections  . Penicillins Hives, Itching and Swelling  . Shrimp [Shellfish Allergy] Hives       Follow-up Information   Follow up with [facility]. Schedule an appointment as soon as possible for a visit in 5 days. (for follow up care and to get a Primary Care Physician)    Contact information:   [facility] [address] [place], [place] [address]      Follow up with [facility]. (Gastroenterology office will call you on Monday 12/20/12 for appointment details)    Specialty:  Gastroenterology   Contact information:   [address] [place] [address] [phone]       The results of significant diagnostics from this hospitalization (including imaging, microbiology, ancillary and laboratory) are listed below for reference.     Labs: Basic Metabolic Panel:  Recent Labs Lab 12/16/12 1255 12/17/12 0555  NA 135 138  K 3.3* 3.8  CL 102 105  CO2 23 23  GLUCOSE 105* 91  BUN 8 8  CREATININE 0.61 0.68  CALCIUM 8.8 8.5   Liver Function Tests:  Recent Labs Lab 12/16/12 1255 12/17/12 0555  AST 12 11  ALT 6 5  ALKPHOS 61 57  BILITOT 0.2* 0.1*  PROT 8.9* 8.1  ALBUMIN 3.0* 2.6*   CBC:  Recent Labs Lab 12/16/12 1255 12/17/12 0555  WBC 7.3 7.1  HGB 8.9* 8.2*  HCT 28.1* 26.9*  MCV 69.9* 70.6*  PLT 535* 465*    Signed:  Elbony Mcclimans  [facility] 12/17/2012, 9:48 AM

## 2012-12-17 NOTE — Progress Notes (Signed)
Pt discharged. Pt refusing scheduled medications. Prior to discharge. Dc instructions provided. Pt voiced understanding.   Roxy Manns [place],RN,MSN [phone]

## 2012-12-19 ENCOUNTER — Telehealth: Payer: Self-pay | Admitting: *Deleted

## 2012-12-19 NOTE — Telephone Encounter (Signed)
Message copied by Daphine Deutscher on Mon Dec 19, 2012  9:12 AM ------      Message from: Mike Gip S      Created: Sat Dec 17, 2012  9:44 AM       Rene Kocher- this pt was in the hospital over the weekend-had some rectal bleeding- we did not see in consult but said we would see her outpt -will need colonoscopy- please call her and get her an appt with me is fine.. thanks ------

## 2012-12-19 NOTE — Telephone Encounter (Signed)
Spoke with patient and told her per our director, she would be billed for OV. She will be given information for [facility] assistance also. Patient scheduled on 12/20/12 at 10:00 AM with Mike Gip, PA.

## 2012-12-19 NOTE — Telephone Encounter (Signed)
Spoke with patient and she states she already spoke to our office. She does not have $184 to pay for OV.

## 2012-12-19 NOTE — Telephone Encounter (Signed)
Left a message for patient to call me. 

## 2012-12-19 NOTE — Telephone Encounter (Signed)
Left a message for patient to call me re: OV and payment.

## 2012-12-20 ENCOUNTER — Ambulatory Visit (INDEPENDENT_AMBULATORY_CARE_PROVIDER_SITE_OTHER): Payer: Self-pay | Admitting: Physician Assistant

## 2012-12-20 ENCOUNTER — Encounter: Payer: Self-pay | Admitting: Physician Assistant

## 2012-12-20 VITALS — BP 110/78 | HR 89 | Ht 70.0 in | Wt 271.0 lb

## 2012-12-20 DIAGNOSIS — D509 Iron deficiency anemia, unspecified: Secondary | ICD-10-CM

## 2012-12-20 DIAGNOSIS — K625 Hemorrhage of anus and rectum: Secondary | ICD-10-CM

## 2012-12-20 DIAGNOSIS — Z800 Family history of malignant neoplasm of digestive organs: Secondary | ICD-10-CM

## 2012-12-20 MED ORDER — MOVIPREP 100 G PO SOLR: 1.0000 | Freq: Once | ORAL | Status: DC

## 2012-12-20 MED ORDER — AMBULATORY NON FORMULARY MEDICATION: 1.0000 | Freq: Every day | Status: DC

## 2012-12-20 NOTE — Patient Instructions (Addendum)
We have given you samples of an iron supplement. We also sent a prescription to your pharmacy. We also sent the prescription for the colonoscopy prep you will be drinking. We have given you a work note for today. You have been scheduled for a colonoscopy with propofol. Please follow written instructions given to you at your visit today.  Please pick up your prep kit at the pharmacy within the next 1-3 days. If you use inhalers (even only as needed), please bring them with you on the day of your procedure. Marland Kitchen

## 2012-12-20 NOTE — Progress Notes (Signed)
Reviewed, she will likely need Ferra heme again , last infusion 1 year ago, her H/H hardly improved. I agree with colonoscopy. I will talk about iron infusions when we do colon. She is either non compliant with oral iron or losing blood too fast.

## 2012-12-20 NOTE — Progress Notes (Signed)
Subjective:    Patient ID: Amanda Davenport, female    DOB: 12/03/1980, 32 y.o.   MRN: [id number]  HPI  Amanda Davenport  is a pleasant 32 year old African -American female who is referred today by the [facility] for evaluation of rectal bleeding and iron deficiency anemia. She was admitted overnight on 12/16/2012 after she presented to the emergency room with complaints of bright red blood per rectum. She states she had one episode with passage of large amounts of bright red blood and states she noticed a commode full of blood and bright red blood on the tissue . She has not had any further obvious rectal bleeding since then but says she sees small amounts of bright red blood on the tissue intermittently. In retrospect she has had intermittent bleeding blood noted on the tissue over the past at least 1 year. She has no current complaints of abdominal pain changes in her bowel habits or rectal pain. Reviewing her records she did have an ER visit in May of 2014 which was documented as  menorrhagia however on further questioning the patient says she had one day of heavy menstrual bleeding which worried her because she saw a small clot. Other than that she says her periods have been very regular she's not had any changes in her cycle over the past couple of years and her menstrual period generally last 5 days. She does have family history of breast cancer in her mother who died in her early 23s, patient is not sure whether she also had colon cancer or whether her breast cancer spread to her abdomen. Patient also has previously documented history of iron deficiency anemia and actually required a transfusion last October for hemoglobin down to 7. She was evaluated by Dr Darrold Span in 2013.she was to have colonoscopy but did not follow through with that. She was also ultimately placed on ferrous gluconate was to followup with Dr. Darrold Span as well.  Patient also has history of WPW and had been seen in the past by  cardiology / Dr. Ladona Ridgel. She was placed on a beta blocker and discussions were had about an ablation procedure the patient says she lost her insurance and hasn't been able to followup with any of these physicians.  Most recent labs show a hemoglobin of 8.2 hematocrit of 26.3 MCV of 70.6 on 12/17/2012, serum iron was 20 TIBC 280 iron saturation 7 ferritin of 9. Hemoglobin in April of 2014 was 10.9 and hemoglobin in October of 2013 was 8.3    Review of Systems  Constitutional: Negative.   HENT: Negative.   Eyes: Negative.   Respiratory: Negative.   Cardiovascular: Negative.   Gastrointestinal: Positive for blood in stool and anal bleeding.  Endocrine: Negative.   Genitourinary: Negative.   Musculoskeletal: Negative.   Skin: Negative.   Allergic/Immunologic: Negative.   Neurological: Negative.   Hematological: Negative.   Psychiatric/Behavioral: Negative.    Outpatient Encounter Prescriptions as of 12/20/2012  Medication Sig Dispense Refill  . AMBULATORY NON FORMULARY MEDICATION Take 1 capsule by mouth daily. Medication Name: MAX FE  30 capsule  1  . MOVIPREP 100 G SOLR Take 1 kit (200 g total) by mouth once. "Pharmacist please use BIN: [id number] GROUP: [id number] ID: [id number] Call [phone] for pharmacy questions "Pt will save $10"  1 kit  0  . [DISCONTINUED] docusate sodium (COLACE) 100 MG capsule Take 100 mg by mouth daily as needed for constipation.      . [DISCONTINUED] ferrous sulfate 325 (65  FE) MG tablet Take 1 tablet (325 mg total) by mouth 3 (three) times daily with meals.  90 tablet  3  . [DISCONTINUED] hydrocortisone (ANUSOL-HC) 25 MG suppository Place 1 suppository (25 mg total) rectally 2 (two) times daily.  12 suppository  0  . [DISCONTINUED] polyethylene glycol (MIRALAX / GLYCOLAX) packet Take 17 g by mouth daily.  14 each  1   No facility-administered encounter medications on file as of 12/20/2012.   Allergies  Allergen Reactions  . Other     All Antibiotics  cause severe vaginal yeast infections  . Penicillins Hives, Itching and Swelling  . Shrimp [Shellfish Allergy] Hives   Patient Active Problem List   Diagnosis Date Noted  . Hematochezia 12/16/2012  . Wolff-Parkinson-White (WPW) syndrome 03/01/2012  . Iron deficiency anemia 02/16/2012  . Tachycardia 01/28/2012  . Hidradenitis suppurativa 08/18/2011  . AXILLARY ABSCESS 12/30/2006  . ANKLE PAIN 10/11/2006   History  Substance Use Topics  . Smoking status: Former Smoker -- .5 years    Types: Cigarettes  . Smokeless tobacco: Never Used     Comment: smokes black and milds  . Alcohol Use: Yes     Comment: occasionally   family history includes Breast cancer in her mother; Colon cancer in her mother; Diabetes in her paternal grandmother; Irritable bowel syndrome in her mother; Pulmonary embolism in her mother. There is no history of Heart disease or Stroke.     Objective:   Physical Exam  well-developed obese African American female in no acute distress, pleasant blood pressure 110/78 pulse 89 height 5 foot 10 weight 271. HEENT nontraumatic normocephalic EOMI PERRLA sclera anicteric, Supple no JVD, Cardiovascular regular rate and rhythm with S1-S2 no murmur or gallop, Pulmonary clear bilaterally, Abdomen soft bowel sounds are active she is mildly  tender in the left mid abdomen, there is no palpable mass or hepatosplenomegaly , Rectal exam not repeated this was done in the emergency room last week and documented Hemoccult positive with no external lesion noted., Extremities no clubbing cyanosis or edema skin warm and dry, Psych mood and affect normal         Assessment & Plan:  #1 32 year old female with intermittent rectal bleeding x1 year with recent episode of more active bleeding which was transient. Will need to rule out occult colon lesion, colitis, internal hemorrhoids #2 positive family history of probable colon cancer in her mother age 30 #3 positive family history of breast  cancer in her mother #4 Wolff-Parkinson-White syndrome #5 iron deficiency anemia-chronic  Plan; have started Max FE today 1 tablet daily, samples were given-this contains B. vitamins vitamin C as well as Colace Schedule for colonoscopy with Dr. Tonia Brooms was discussed in detail with the patient and she is agreeable to proceed. Further plans pending results of colonoscopy Patient is advised should she have any further episodes of more active bleeding to call and we will need to repeat her CBC

## 2013-01-03 ENCOUNTER — Ambulatory Visit: Payer: PRIVATE HEALTH INSURANCE | Admitting: Family Medicine

## 2013-01-04 ENCOUNTER — Other Ambulatory Visit: Payer: Self-pay | Admitting: *Deleted

## 2013-01-04 ENCOUNTER — Telehealth: Payer: Self-pay | Admitting: *Deleted

## 2013-01-04 ENCOUNTER — Encounter: Payer: Self-pay | Admitting: Internal Medicine

## 2013-01-04 ENCOUNTER — Ambulatory Visit (AMBULATORY_SURGERY_CENTER): Payer: Self-pay | Admitting: Internal Medicine

## 2013-01-04 VITALS — BP 116/73 | HR 76 | Temp 97.1°F | Resp 20 | Ht 70.0 in | Wt 271.0 lb

## 2013-01-04 DIAGNOSIS — K625 Hemorrhage of anus and rectum: Secondary | ICD-10-CM

## 2013-01-04 DIAGNOSIS — D126 Benign neoplasm of colon, unspecified: Secondary | ICD-10-CM

## 2013-01-04 DIAGNOSIS — D509 Iron deficiency anemia, unspecified: Secondary | ICD-10-CM

## 2013-01-04 DIAGNOSIS — Z800 Family history of malignant neoplasm of digestive organs: Secondary | ICD-10-CM

## 2013-01-04 HISTORY — PX: COLONOSCOPY WITH PROPOFOL: SHX5780

## 2013-01-04 MED ORDER — HYDROCORTISONE 0.5 % EX CREA: TOPICAL_CREAM | Freq: Two times a day (BID) | CUTANEOUS | Status: DC

## 2013-01-04 MED ORDER — SODIUM CHLORIDE 0.9 % IV SOLN: 500.0000 mL | INTRAVENOUS | Status: DC

## 2013-01-04 MED ORDER — HYDROCORTISONE 0.5 % EX CREA: TOPICAL_CREAM | Freq: Three times a day (TID) | CUTANEOUS | Status: DC

## 2013-01-04 MED ORDER — HYDROCORTISONE 0.5 % EX CREA
TOPICAL_CREAM | Freq: Three times a day (TID) | CUTANEOUS | Status: DC
Start: 2013-01-04 — End: 2013-05-13

## 2013-01-04 NOTE — Progress Notes (Signed)
Discussed with Dr. Juanda Chance difference in rx. For hydrocortisone cream, ordered 0.5% three times daily. rx sent to [facility] then patient requested a change in pharmacy to [facility], sent as requested.

## 2013-01-04 NOTE — Progress Notes (Signed)
Called to room to assist during endoscopic procedure.  Patient ID and intended procedure confirmed with present staff. Received instructions for my participation in the procedure from the performing physician.  

## 2013-01-04 NOTE — Progress Notes (Signed)
Patient did not experience any of the following events: a burn prior to discharge; a fall within the facility; wrong site/side/patient/procedure/implant event; or a hospital transfer or hospital admission upon discharge from the facility. (G8907) Patient did not have preoperative order for IV antibiotic SSI prophylaxis. (G8918)  

## 2013-01-04 NOTE — Op Note (Addendum)
[facility] [address] [place], [address]   COLONOSCOPY PROCEDURE REPORT  PATIENT: Amanda Davenport, Amanda Davenport  MR#: [id number] BIRTHDATE: 1980-07-19 , 31  yrs. old GENDER: Female ENDOSCOPIST: Hart Carwin, MD REFERRED BY:  none PROCEDURE DATE:  01/04/2013 PROCEDURE:   Colonoscopy with cold biopsy polypectomy ASA CLASS:   Class II INDICATIONS:hematochezia and Iron Deficiency Anemia. gfamily hx of colon cancer, severe iron deficiency anemia- Hgb 8.2 MEDICATIONS: MAC sedation, administered by CRNA and propofol (Diprivan) 300mg  IV  DESCRIPTION OF PROCEDURE:   After the risks and benefits and of the procedure were explained, informed consent was obtained.  A digital rectal exam revealed no abnormalities of the rectum.    The [facility] [id number]  endoscope was introduced through the anus and advanced to the cecum, which was identified by both the appendix and ileocecal valve .  The quality of the prep was excellent, using MoviPrep .  The instrument was then slowly withdrawn as the colon was fully examined.     COLON FINDINGS: Two diminutive sessile polyps were found in the ascending colon.at 100 cm,  A polypectomy was performed with cold forceps. There were scattered shallow diverticuli throughout the left and the right colon The resection was complete and the polyp tissue was completely retrieved.     Retroflexed views revealed no abnormalities.     The scope was then withdrawn from the patient and the procedure completed.  COMPLICATIONS: There were no complications. ENDOSCOPIC IMPRESSION: Two diminutive sessile polyps were found in the ascending colon; polypectomy was performed with cold forceps Mild diverticulosis of the left and right colon Mild anal irritation, no significant hemorrhoids  RECOMMENDATIONS: 1.  Await pathology results 2.   high fiber diet 3. Analprem 1% cream apply tid prn rectal bleeding 4. Begin Benefiber 1 tsp daily 5. schedule Iron  infusion   REPEAT EXAM: for Colonoscopy, pending biopsy results.  cc:  _______________________________ eSignedHart Carwin, MD 01/04/2013 11:35 AM Revised: 01/04/2013 11:35 AM    PATIENT NAME:  Amanda Davenport MR#: [id number]

## 2013-01-04 NOTE — Telephone Encounter (Signed)
Spoke with Geraldine Contras at [facility] short stay and scheduled Feraheme 2 doses at [facility] short stay 01/19/13 at 11:00 AM and 01/26/13 at 12:00 PM. Orders in EPIC. Left a message for patient to call me.

## 2013-01-04 NOTE — Patient Instructions (Signed)
Begin Benefiber 1 tsp. Daily. High fiber diet with liberal fluid intake. Dr. Regino Schultze office will call you to set up Iron infusion.    YOU HAD AN ENDOSCOPIC PROCEDURE TODAY AT THE [facility]: Refer to the procedure report that was given to you for any specific questions about what was found during the examination.  If the procedure report does not answer your questions, please call your gastroenterologist to clarify.  If you requested that your care partner not be given the details of your procedure findings, then the procedure report has been included in a sealed envelope for you to review at your convenience later.  YOU SHOULD EXPECT: Some feelings of bloating in the abdomen. Passage of more gas than usual.  Walking can help get rid of the air that was put into your GI tract during the procedure and reduce the bloating. If you had a lower endoscopy (such as a colonoscopy or flexible sigmoidoscopy) you may notice spotting of blood in your stool or on the toilet paper. If you underwent a bowel prep for your procedure, then you may not have a normal bowel movement for a few days.  DIET: Your first meal following the procedure should be a light meal and then it is ok to progress to your normal diet.  A half-sandwich or bowl of soup is an example of a good first meal.  Heavy or fried foods are harder to digest and may make you feel nauseous or bloated.  Likewise meals heavy in dairy and vegetables can cause extra gas to form and this can also increase the bloating.  Drink plenty of fluids but you should avoid alcoholic beverages for 24 hours.  ACTIVITY: Your care partner should take you home directly after the procedure.  You should plan to take it easy, moving slowly for the rest of the day.  You can resume normal activity the day after the procedure however you should NOT DRIVE or use heavy machinery for 24 hours (because of the sedation medicines used during the test).    SYMPTOMS TO  REPORT IMMEDIATELY: A gastroenterologist can be reached at any hour.  During normal business hours, 8:30 AM to 5:00 PM Monday through Friday, call [phone].  After hours and on weekends, please call the GI answering service at [phone] who will take a message and have the physician on call contact you.   Following lower endoscopy (colonoscopy or flexible sigmoidoscopy):  Excessive amounts of blood in the stool  Significant tenderness or worsening of abdominal pains  Swelling of the abdomen that is new, acute  Fever of 100F or higher FOLLOW UP: If any biopsies were taken you will be contacted by phone or by letter within the next 1-3 weeks.  Call your gastroenterologist if you have not heard about the biopsies in 3 weeks.  Our staff will call the home number listed on your records the next business day following your procedure to check on you and address any questions or concerns that you may have at that time regarding the information given to you following your procedure. This is a courtesy call and so if there is no answer at the home number and we have not heard from you through the emergency physician on call, we will assume that you have returned to your regular daily activities without incident.  SIGNATURES/CONFIDENTIALITY: You and/or your care partner have signed paperwork which will be entered into your electronic medical record.  These signatures attest to the  fact that that the information above on your After Visit Summary has been reviewed and is understood.  Full responsibility of the confidentiality of this discharge information lies with you and/or your care-partner.

## 2013-01-05 ENCOUNTER — Telehealth: Payer: Self-pay | Admitting: *Deleted

## 2013-01-05 NOTE — Telephone Encounter (Signed)
Name identifier, left message, follow-up 

## 2013-01-05 NOTE — Telephone Encounter (Signed)
Patient left a message to call her. Left a message for her to call me.

## 2013-01-05 NOTE — Telephone Encounter (Signed)
Spoke with patient and gave her location, date and time for Feraheme.

## 2013-01-05 NOTE — Telephone Encounter (Signed)
Left a message for patient to call me. 

## 2013-01-09 ENCOUNTER — Encounter: Payer: Self-pay | Admitting: Internal Medicine

## 2013-01-17 ENCOUNTER — Ambulatory Visit: Payer: Self-pay | Admitting: Internal Medicine

## 2013-01-19 ENCOUNTER — Encounter (HOSPITAL_COMMUNITY): Payer: Self-pay

## 2013-01-19 ENCOUNTER — Other Ambulatory Visit (HOSPITAL_COMMUNITY): Payer: Self-pay | Admitting: Internal Medicine

## 2013-01-19 ENCOUNTER — Encounter (HOSPITAL_COMMUNITY)
Admission: RE | Admit: 2013-01-19 | Discharge: 2013-01-19 | Disposition: A | Discharge status: Home / Self Care | Payer: Self-pay | Source: Ambulatory Visit | Attending: Internal Medicine | Admitting: Internal Medicine

## 2013-01-19 VITALS — BP 106/64 | HR 88 | Temp 98.3°F | Resp 16 | Ht 70.0 in | Wt 271.0 lb

## 2013-01-19 DIAGNOSIS — D509 Iron deficiency anemia, unspecified: Secondary | ICD-10-CM | POA: Insufficient documentation

## 2013-01-19 MED ORDER — FERUMOXYTOL INJECTION 510 MG/17 ML
510.0000 mg | Freq: Once | INTRAVENOUS | Status: AC
  Administered 2013-01-19: 510 mg via INTRAVENOUS
  Filled 2013-01-19: qty 17

## 2013-01-19 MED ORDER — SODIUM CHLORIDE 0.9 % IV SOLN
Freq: Once | INTRAVENOUS | Status: AC
  Administered 2013-01-19: 12:00:00 via INTRAVENOUS

## 2013-01-26 ENCOUNTER — Ambulatory Visit (HOSPITAL_COMMUNITY)
Admission: RE | Admit: 2013-01-26 | Discharge: 2013-01-26 | Disposition: A | Discharge status: Home / Self Care | Payer: Self-pay | Source: Ambulatory Visit | Attending: Internal Medicine | Admitting: Internal Medicine

## 2013-01-26 ENCOUNTER — Encounter (HOSPITAL_COMMUNITY): Payer: Self-pay

## 2013-01-26 VITALS — BP 107/69 | HR 85 | Temp 97.0°F | Resp 18

## 2013-01-26 DIAGNOSIS — D509 Iron deficiency anemia, unspecified: Secondary | ICD-10-CM | POA: Insufficient documentation

## 2013-01-26 MED ORDER — SODIUM CHLORIDE 0.9 % IV SOLN
Freq: Once | INTRAVENOUS | Status: AC
  Administered 2013-01-26: 12:00:00 via INTRAVENOUS

## 2013-01-26 MED ORDER — FERUMOXYTOL INJECTION 510 MG/17 ML
510.0000 mg | Freq: Once | INTRAVENOUS | Status: AC
  Administered 2013-01-26: 510 mg via INTRAVENOUS
  Filled 2013-01-26: qty 17

## 2013-02-16 ENCOUNTER — Ambulatory Visit: Payer: Self-pay

## 2013-05-13 ENCOUNTER — Encounter (HOSPITAL_COMMUNITY): Payer: Self-pay | Admitting: *Deleted

## 2013-05-13 ENCOUNTER — Inpatient Hospital Stay (HOSPITAL_COMMUNITY)
Admission: AD | Admit: 2013-05-13 | Discharge: 2013-05-13 | Disposition: A | Discharge status: Home / Self Care | Payer: Self-pay | Source: Ambulatory Visit | Attending: Obstetrics & Gynecology | Admitting: Obstetrics & Gynecology

## 2013-05-13 DIAGNOSIS — I456 Pre-excitation syndrome: Secondary | ICD-10-CM | POA: Insufficient documentation

## 2013-05-13 DIAGNOSIS — Z87891 Personal history of nicotine dependence: Secondary | ICD-10-CM | POA: Insufficient documentation

## 2013-05-13 DIAGNOSIS — L732 Hidradenitis suppurativa: Secondary | ICD-10-CM

## 2013-05-13 LAB — URINALYSIS, ROUTINE W REFLEX MICROSCOPIC
BILIRUBIN URINE: NEGATIVE
GLUCOSE, UA: NEGATIVE mg/dL
KETONES UR: NEGATIVE mg/dL
LEUKOCYTES UA: NEGATIVE
Nitrite: NEGATIVE
Protein, ur: NEGATIVE mg/dL
Specific Gravity, Urine: 1.01 (ref 1.005–1.030)
Urobilinogen, UA: 0.2 mg/dL (ref 0.0–1.0)
pH: 6.5 (ref 5.0–8.0)

## 2013-05-13 LAB — WET PREP, GENITAL
TRICH WET PREP: NONE SEEN
Yeast Wet Prep HPF POC: NONE SEEN

## 2013-05-13 LAB — URINE MICROSCOPIC-ADD ON

## 2013-05-13 LAB — POCT PREGNANCY, URINE: Preg Test, Ur: NEGATIVE

## 2013-05-13 MED ORDER — SULFAMETHOXAZOLE-TMP DS 800-160 MG PO TABS
1.0000 | ORAL_TABLET | Freq: Once | ORAL | Status: AC
  Administered 2013-05-13: 1 via ORAL
  Filled 2013-05-13: qty 1

## 2013-05-13 MED ORDER — LIDOCAINE 5 % EX OINT
TOPICAL_OINTMENT | Freq: Every day | CUTANEOUS | Status: DC | PRN
  Administered 2013-05-13: 18:00:00 via TOPICAL
  Filled 2013-05-13: qty 35.44

## 2013-05-13 MED ORDER — SULFAMETHOXAZOLE-TRIMETHOPRIM 800-160 MG PO TABS: 1.0000 | ORAL_TABLET | Freq: Two times a day (BID) | ORAL | Status: DC

## 2013-05-13 MED ORDER — FLUCONAZOLE 150 MG PO TABS
150.0000 mg | ORAL_TABLET | Freq: Once | ORAL | Status: AC
  Administered 2013-05-13: 150 mg via ORAL
  Filled 2013-05-13 (×2): qty 1

## 2013-05-13 MED ORDER — SULFAMETHOXAZOLE-TRIMETHOPRIM 400-80 MG PO TABS
1.0000 | ORAL_TABLET | Freq: Once | ORAL | Status: DC
  Filled 2013-05-13: qty 1

## 2013-05-13 MED ORDER — FLUCONAZOLE 150 MG PO TABS: 150.0000 mg | ORAL_TABLET | Freq: Once | ORAL | Status: DC

## 2013-05-13 MED ORDER — MUPIROCIN 2 % EX OINT
TOPICAL_OINTMENT | Freq: Two times a day (BID) | CUTANEOUS | Status: DC
  Administered 2013-05-13: 18:00:00 via NASAL
  Filled 2013-05-13: qty 22

## 2013-05-13 NOTE — MAU Note (Signed)
Pt presents with complaints of vaginal discharge and itching. She states that the discharge does not have an odor.

## 2013-05-13 NOTE — MAU Provider Note (Signed)
History     CSN: [id number]  Arrival date and time: 05/13/13 1457   None     Chief Complaint  Patient presents with  . Vaginal Discharge   Vaginal Discharge The patient's primary symptoms include a vaginal discharge. Associated symptoms include rash. Pertinent negatives include no abdominal pain, chills, fever, nausea or vomiting.   RN note:  MAU Note Service date: 05/13/2013 3:17 PM   Pt presents with complaints of vaginal discharge and itching. She states that the discharge does not have an odor    Past Medical History  Diagnosis Date  . Allergy   . Anemia   . Recurrent boils   . Nearsightedness     wears glasses  . Chronic headache   . Arrhythmia   . WPW (Wolff-Parkinson-White syndrome)     Past Surgical History  Procedure Laterality Date  . Cystectomy      tonsils    Family History  Problem Relation Age of Onset  . Breast cancer Mother   . Pulmonary embolism Mother     died of PE  . Diabetes Paternal Grandmother   . Heart disease Neg Hx   . Stroke Neg Hx   . Colon cancer Mother   . Irritable bowel syndrome Mother     History  Substance Use Topics  . Smoking status: Former Smoker -- .5 years    Types: Cigarettes  . Smokeless tobacco: Never Used     Comment: smokes black and milds  . Alcohol Use: Yes     Comment: occasionally    Allergies:  Allergies  Allergen Reactions  . Other     All Antibiotics cause severe vaginal yeast infections  . Penicillins Hives, Itching and Swelling  . Shrimp [Shellfish Allergy] Hives    Prescriptions prior to admission  Medication Sig Dispense Refill  . ibuprofen (ADVIL,MOTRIN) 200 MG tablet Take 800 mg by mouth every 6 (six) hours as needed for moderate pain.      . miconazole (MONISTAT 7) 2 % vaginal cream Place 1 Applicatorful vaginally at bedtime.        Review of Systems  Constitutional: Negative for fever and chills.  Gastrointestinal: Negative for nausea, vomiting and abdominal pain.  Genitourinary:  Positive for vaginal discharge.  Skin: Positive for rash.       Open sores   Physical Exam   Blood pressure 109/68, pulse 88, temperature 98.7 F (37.1 C), resp. rate 18, last menstrual period 05/07/2013.  Physical Exam  Nursing note and vitals reviewed. Constitutional: She appears well-developed and well-nourished. No distress.  HENT:  Head: Normocephalic.  Eyes: Pupils are equal, round, and reactive to light.  Neck: Normal range of motion.  Cardiovascular: Normal rate.   Respiratory: Effort normal.  GI: Soft. She exhibits no distension. There is no tenderness. There is no rebound.  Genitourinary:  Hidradenitis with open sores externally in groin and inner thighs; vaginal small amount of creamy discharge; bimanual complicated by habitus- nontender  Musculoskeletal: Normal range of motion.  Neurological: She is alert.  Skin: Skin is warm.  Hidradenitis with open sores on inner thighs and bilateral groin region as well as under arms bilaterally.    MAU Course  Procedures Results for orders placed during the hospital encounter of 05/13/13 (from the past 24 hour(s))  URINALYSIS, ROUTINE W REFLEX MICROSCOPIC     Status: Abnormal   Collection Time    05/13/13  3:20 PM      Result Value Range   Color, Urine YELLOW  YELLOW   APPearance CLEAR  CLEAR   Specific Gravity, Urine 1.010  1.005 - 1.030   pH 6.5  5.0 - 8.0   Glucose, UA NEGATIVE  NEGATIVE mg/dL   Hgb urine dipstick TRACE (*) NEGATIVE   Bilirubin Urine NEGATIVE  NEGATIVE   Ketones, ur NEGATIVE  NEGATIVE mg/dL   Protein, ur NEGATIVE  NEGATIVE mg/dL   Urobilinogen, UA 0.2  0.0 - 1.0 mg/dL   Nitrite NEGATIVE  NEGATIVE   Leukocytes, UA NEGATIVE  NEGATIVE  URINE MICROSCOPIC-ADD ON     Status: Abnormal   Collection Time    05/13/13  3:20 PM      Result Value Range   Squamous Epithelial / LPF FEW (*) RARE   WBC, UA 0-2  <3 WBC/hpf   RBC / HPF 0-2  <3 RBC/hpf   Bacteria, UA FEW (*) RARE  POCT PREGNANCY, URINE     Status:  None   Collection Time    05/13/13  3:28 PM      Result Value Range   Preg Test, Ur NEGATIVE  NEGATIVE  WET PREP, GENITAL     Status: Abnormal   Collection Time    05/13/13  4:45 PM      Result Value Range   Yeast Wet Prep HPF POC NONE SEEN  NONE SEEN   Trich, Wet Prep NONE SEEN  NONE SEEN   Clue Cells Wet Prep HPF POC FEW (*) NONE SEEN   WBC, Wet Prep HPF POC FEW (*) NONE SEEN   Discussed with Dr. Roselie Awkward Will treat with Septra and topical Bactroban Will also treat with Diflucan Will have pt f/u with community [facility] since no insurance and does not have PCP    Assessment and Plan  Hidradenitis- Septra DS BID for 10 days Bactroban ointment Lidocaine ointment Zeasorb AF powder; Diflucan 150mg  repeat in 3 days F/u with PCP  05/13/2013, 4:41 PM

## 2013-05-15 LAB — GC/CHLAMYDIA PROBE AMP
CT Probe RNA: NEGATIVE
GC PROBE AMP APTIMA: NEGATIVE

## 2013-08-16 ENCOUNTER — Emergency Department (HOSPITAL_COMMUNITY)
Admission: EM | Admit: 2013-08-16 | Discharge: 2013-08-16 | Disposition: A | Discharge status: Home / Self Care | Payer: Self-pay | Attending: Emergency Medicine | Admitting: Emergency Medicine

## 2013-08-16 ENCOUNTER — Encounter (HOSPITAL_COMMUNITY): Payer: Self-pay | Admitting: Emergency Medicine

## 2013-08-16 ENCOUNTER — Emergency Department (HOSPITAL_COMMUNITY): Payer: Self-pay

## 2013-08-16 DIAGNOSIS — Z791 Long term (current) use of non-steroidal anti-inflammatories (NSAID): Secondary | ICD-10-CM | POA: Insufficient documentation

## 2013-08-16 DIAGNOSIS — Z9119 Patient's noncompliance with other medical treatment and regimen: Secondary | ICD-10-CM | POA: Insufficient documentation

## 2013-08-16 DIAGNOSIS — Z9114 Patient's other noncompliance with medication regimen: Secondary | ICD-10-CM

## 2013-08-16 DIAGNOSIS — Z872 Personal history of diseases of the skin and subcutaneous tissue: Secondary | ICD-10-CM | POA: Insufficient documentation

## 2013-08-16 DIAGNOSIS — Z87891 Personal history of nicotine dependence: Secondary | ICD-10-CM | POA: Insufficient documentation

## 2013-08-16 DIAGNOSIS — Z789 Other specified health status: Secondary | ICD-10-CM | POA: Insufficient documentation

## 2013-08-16 DIAGNOSIS — D509 Iron deficiency anemia, unspecified: Secondary | ICD-10-CM | POA: Insufficient documentation

## 2013-08-16 DIAGNOSIS — Z91199 Patient's noncompliance with other medical treatment and regimen due to unspecified reason: Secondary | ICD-10-CM | POA: Insufficient documentation

## 2013-08-16 DIAGNOSIS — Z79899 Other long term (current) drug therapy: Secondary | ICD-10-CM | POA: Insufficient documentation

## 2013-08-16 DIAGNOSIS — I456 Pre-excitation syndrome: Secondary | ICD-10-CM | POA: Insufficient documentation

## 2013-08-16 DIAGNOSIS — Z880 Allergy status to penicillin: Secondary | ICD-10-CM | POA: Insufficient documentation

## 2013-08-16 LAB — URINALYSIS, ROUTINE W REFLEX MICROSCOPIC
BILIRUBIN URINE: NEGATIVE
Glucose, UA: NEGATIVE mg/dL
HGB URINE DIPSTICK: NEGATIVE
Ketones, ur: NEGATIVE mg/dL
Nitrite: NEGATIVE
PROTEIN: NEGATIVE mg/dL
Specific Gravity, Urine: 1.024 (ref 1.005–1.030)
UROBILINOGEN UA: 1 mg/dL (ref 0.0–1.0)
pH: 5.5 (ref 5.0–8.0)

## 2013-08-16 LAB — CBC
HEMATOCRIT: 28.5 % — AB (ref 36.0–46.0)
Hemoglobin: 8.9 g/dL — ABNORMAL LOW (ref 12.0–15.0)
MCH: 22.6 pg — ABNORMAL LOW (ref 26.0–34.0)
MCHC: 31.2 g/dL (ref 30.0–36.0)
MCV: 72.3 fL — ABNORMAL LOW (ref 78.0–100.0)
Platelets: 533 10*3/uL — ABNORMAL HIGH (ref 150–400)
RBC: 3.94 MIL/uL (ref 3.87–5.11)
RDW: 16.2 % — ABNORMAL HIGH (ref 11.5–15.5)
WBC: 9.9 10*3/uL (ref 4.0–10.5)

## 2013-08-16 LAB — URINE MICROSCOPIC-ADD ON

## 2013-08-16 LAB — COMPREHENSIVE METABOLIC PANEL
ALT: 6 U/L (ref 0–35)
AST: 12 U/L (ref 0–37)
Albumin: 3 g/dL — ABNORMAL LOW (ref 3.5–5.2)
Alkaline Phosphatase: 65 U/L (ref 39–117)
BUN: 9 mg/dL (ref 6–23)
CHLORIDE: 103 meq/L (ref 96–112)
CO2: 21 mEq/L (ref 19–32)
Calcium: 8.9 mg/dL (ref 8.4–10.5)
Creatinine, Ser: 0.67 mg/dL (ref 0.50–1.10)
GFR calc non Af Amer: >90 mL/min (ref >90)
Glucose, Bld: 103 mg/dL — ABNORMAL HIGH (ref 70–99)
POTASSIUM: 3.3 meq/L — AB (ref 3.7–5.3)
Sodium: 138 mEq/L (ref 137–147)
TOTAL PROTEIN: 9 g/dL — AB (ref 6.0–8.3)

## 2013-08-16 LAB — I-STAT TROPONIN, ED: TROPONIN I, POC: 0 ng/mL (ref 0.00–0.08)

## 2013-08-16 LAB — MAGNESIUM: Magnesium: 2 mg/dL (ref 1.5–2.5)

## 2013-08-16 MED ORDER — METOPROLOL TARTRATE 25 MG PO TABS: 25.0000 mg | ORAL_TABLET | Freq: Two times a day (BID) | ORAL | Status: DC

## 2013-08-16 MED ORDER — SODIUM CHLORIDE 0.9 % IV BOLUS (SEPSIS): 1000.0000 mL | Freq: Once | INTRAVENOUS | Status: DC

## 2013-08-16 MED ORDER — DOCUSATE SODIUM 100 MG PO CAPS
100.0000 mg | ORAL_CAPSULE | Freq: Once | ORAL | Status: AC
  Administered 2013-08-16: 100 mg via ORAL
  Filled 2013-08-16: qty 1

## 2013-08-16 MED ORDER — DSS 100 MG PO CAPS: 100.0000 mg | ORAL_CAPSULE | Freq: Every day | ORAL | Status: DC

## 2013-08-16 MED ORDER — FERROUS SULFATE 325 (65 FE) MG PO TABS
325.0000 mg | ORAL_TABLET | Freq: Once | ORAL | Status: AC
  Administered 2013-08-16: 325 mg via ORAL
  Filled 2013-08-16: qty 1

## 2013-08-16 MED ORDER — METOPROLOL TARTRATE 25 MG PO TABS
25.0000 mg | ORAL_TABLET | Freq: Once | ORAL | Status: AC
  Administered 2013-08-16: 25 mg via ORAL
  Filled 2013-08-16: qty 1

## 2013-08-16 MED ORDER — FERROUS SULFATE 325 (65 FE) MG PO TABS: 325.0000 mg | ORAL_TABLET | Freq: Every day | ORAL | Status: DC

## 2013-08-16 NOTE — ED Notes (Signed)
Verbal order from [place]gore Janus, NP to give 25 mg of Lopressor at this time. Penning Janus, NP aware of current HR and BP.

## 2013-08-16 NOTE — ED Provider Notes (Signed)
CSN: [id number]     Arrival date & time 08/16/13  33 History   First MD Initiated Contact with Patient 08/16/13 1958     Chief Complaint  Patient presents with  . Chest Pain  . Dizziness     (Consider location/radiation/quality/duration/timing/severity/associated sxs/prior Treatment) HPI Comments: Patient with a history of Wolf Parkinson White syndrome, as well as iron deficiency anemia stopped taking her medication approximately one year ago, when her insurance lapsed since that, time.  She's been having intermittent episodes of palpitations that usually resolve after several hours. Tonight presents with persistent palpitations for the past, week, and a half is felt well as lightheadedness with exertion she denies any nausea, vomiting, shortness of breath  Patient is a 33 y.o. female presenting with chest pain and dizziness. The history is provided by the patient.  Chest Pain Pain location:  Unable to specify Pain quality comment:  Palpitations Pain radiates to:  Does not radiate Pain radiates to the back: no   Pain severity:  No pain Timing:  Intermittent Progression:  Worsening Chronicity:  Recurrent Context comment:  Exertional Relieved by:  Rest Worsened by:  Exertion Ineffective treatments:  None tried Associated symptoms: dizziness and palpitations   Associated symptoms: no fever, no lower extremity edema, no nausea, no near-syncope and no shortness of breath   Risk factors comment:  Wolke- Parkinson White syndrome, as well as iron deficiency anemia Dizziness Associated symptoms: chest pain and palpitations   Associated symptoms: no blood in stool, no diarrhea, no nausea and no shortness of breath     Past Medical History  Diagnosis Date  . Allergy   . Anemia   . Recurrent boils   . Nearsightedness     wears glasses  . Chronic headache   . Arrhythmia   . WPW (Wolff-Parkinson-White syndrome)    Past Surgical History  Procedure Laterality Date  . Cystectomy       tonsils   Family History  Problem Relation Age of Onset  . Breast cancer Mother   . Pulmonary embolism Mother     died of PE  . Diabetes Paternal Grandmother   . Heart disease Neg Hx   . Stroke Neg Hx   . Colon cancer Mother   . Irritable bowel syndrome Mother    History  Substance Use Topics  . Smoking status: Former Smoker -- .5 years    Types: Cigarettes  . Smokeless tobacco: Never Used     Comment: smokes black and milds  . Alcohol Use: Yes     Comment: occasionally   OB History   Grav Para Term Preterm Abortions TAB SAB Ect Mult Living   0              Review of Systems  Constitutional: Negative for fever and chills.  Respiratory: Negative for shortness of breath.   Cardiovascular: Positive for chest pain and palpitations. Negative for leg swelling and near-syncope.  Gastrointestinal: Negative for nausea, diarrhea, constipation, blood in stool and anal bleeding.  Skin: Negative for pallor.  Neurological: Positive for dizziness.  All other systems reviewed and are negative.     Allergies  Other; Penicillins; and Shrimp  Home Medications   Prior to Admission medications   Medication Sig Start Date End Date Taking? Authorizing Provider  diphenhydramine-acetaminophen (TYLENOL PM) 25-500 MG TABS Take 2 tablets by mouth at bedtime as needed.   Yes Historical Provider, MD  ibuprofen (ADVIL,MOTRIN) 200 MG tablet Take 400 mg by mouth every 6 (six)  hours as needed for moderate pain.    Yes Historical Provider, MD   BP 116/75  Pulse 94  Temp(Src) 98 F (36.7 C) (Oral)  Resp 20  SpO2 100%  LMP 07/19/2013 Physical Exam  Nursing note and vitals reviewed. Constitutional: She is oriented to person, place, and time. She appears well-developed and well-nourished.  HENT:  Head: Normocephalic.  Eyes: Pupils are equal, round, and reactive to light.  Neck: Normal range of motion.  Cardiovascular: Normal rate and regular rhythm.   Pulmonary/Chest: Effort normal and  breath sounds normal.  Abdominal: Soft.  Neurological: She is alert and oriented to person, place, and time.  Skin: Skin is warm. No rash noted. No pallor.    ED Course  Procedures (including critical care time) Labs Review Labs Reviewed  CBC - Abnormal; Notable for the following:    Hemoglobin 8.9 (*)    HCT 28.5 (*)    MCV 72.3 (*)    MCH 22.6 (*)    RDW 16.2 (*)    Platelets 533 (*)    All other components within normal limits  COMPREHENSIVE METABOLIC PANEL - Abnormal; Notable for the following:    Potassium 3.3 (*)    Glucose, Bld 103 (*)    Total Protein 9.0 (*)    Albumin 3.0 (*)    Total Bilirubin <0.2 (*)    All other components within normal limits  URINALYSIS, ROUTINE W REFLEX MICROSCOPIC - Abnormal; Notable for the following:    APPearance CLOUDY (*)    Leukocytes, UA SMALL (*)    All other components within normal limits  URINE MICROSCOPIC-ADD ON - Abnormal; Notable for the following:    Squamous Epithelial / LPF FEW (*)    All other components within normal limits  MAGNESIUM  I-STAT TROPOININ, ED    Imaging Review Dg Chest 2 View  08/16/2013   CLINICAL DATA:  Dizziness.  Chest pressure.  EXAM: CHEST  2 VIEW  COMPARISON:  PA and lateral chest 08/07/2012.  CT chest 08/09/2011.  FINDINGS: The lungs are clear. Heart size is normal. No pneumothorax or pleural effusion. No focal bony abnormality.  IMPRESSION: Negative chest.   Electronically Signed   By: Inge Rise M.D.   On: 08/16/2013 20:12     EKG Interpretation None      MDM  I spoke with cardiology on-call, who recommended restarting her Lopressor 25 mg twice a day he also suggest checking a magnesium level.  Prior to her discharge and treating her anemia never restarted and discussed with her.  The importance of taking iron on a regular basis, as well as her medication for her WPW.  She, states, that now.  She has a Medicaid and will be able to see Dr. Lovena Le in the office Final diagnoses:   Wolff-Parkinson-White (WPW) syndrome  Iron deficiency anemia  H/O medication noncompliance         Garald Balding, NP 08/16/13 2224

## 2013-08-16 NOTE — ED Notes (Signed)
Pt reports she has hx of WPW heart condition. Pt reports intermittent chest pain, lightheadedness, palpitations and shortness of breath. VSS. No pain at this time.

## 2013-08-16 NOTE — Discharge Instructions (Signed)
Iron Deficiency Anemia, Adult Anemia is a condition in which there are less red blood cells or hemoglobin in the blood than normal. Hemoglobin is this part of red blood cells that carries oxygen. Iron deficiency anemia is anemia caused by too little iron. It is the most common type of anemia. It may leave you tired and short of breath. CAUSES   Lack of iron in the diet.  Poor absorption of iron, as seen with intestinal disorders.  Intestinal bleeding.  Heavy periods. SIGNS AND SYMPTOMS  Mild anemia may not be noticeable. Symptoms may include:  Fatigue.  Headache.  Pale skin.  Weakness.  Tiredness.  Shortness of breath.  Dizziness.  Cold hands and feet.  Fast or irregular heartbeat. DIAGNOSIS  Diagnosis requires a thorough evaluation and physical exam by your health care provider. Blood tests are generally used to confirm iron deficiency anemia. Additional tests may be done to find the underlying cause of your anemia. These may include:  Testing for blood in the stool (fecal occult blood test).  A procedure to see inside the colon and rectum (colonoscopy).  A procedure to see inside the esophagus and stomach (endoscopy). TREATMENT  Iron deficiency anemia is treated by correcting the cause of the deficiency. Treatment may involve:  Adding iron-rich foods to your diet.  Taking iron supplements. Pregnant or breastfeeding women need to take extra iron, because their normal diet usually does not provide the required amount.  Taking vitamins. Vitamin C improves the absorption of iron. Your health care provider may recommend taking your iron tablets with a glass of orange juice or vitamin C supplement.  Medicines to make heavy menstrual flow lighter.  Surgery. HOME CARE INSTRUCTIONS   Take iron as directed by your health care provider.  If you cannot tolerate taking iron supplements by mouth, talk to your health care provider about taking them through a vein  (intravenously) or an injection into a muscle.  For the best iron absorption, iron supplements should be taken on an empty stomach. If you cannot tolerate them on an empty stomach, you may need to take them with food.  Do not drink milk or take antacids at the same time as your iron supplements. Milk and antacids may interfere with the absorption of iron.  Iron supplements can cause constipation. Make sure to include fiber in your diet to prevent constipation. A stool softener may also be recommended.  Take vitamins as directed by your health care provider.  Eat a diet rich in iron. Foods high in iron include liver, lean beef, whole-grain bread, eggs, dried fruit, and dark green, leafy vegetables. SEEK IMMEDIATE MEDICAL CARE IF:   You faint. If this happens, do not drive. Call your local emergency services (911 in [place] if no other help is available.  You have chest pain.  You feel nauseous or vomit.  You have severe or increased shortness of breath with activity.  You feel weak.  You have a rapid heartbeat.  You have unexplained sweating.  You become lightheaded when getting up from a chair or bed. MAKE SURE YOU:   Understand these instructions.  Will watch your condition.  Will get help right away if you are not doing well or get worse. Document Released: 03/20/2000 Document Revised: 01/11/2013 Document Reviewed: 11/28/2012 [facility] Patient Information 2014 [place]. I spoke with cardiology, who recommended that she be restarted on your medication.  Please take this as directed 25 mg of Lopressor twice a day, as well as treating  her anemia.  Please take the 325 mg tablet of iron in the morning.  You've also been given a prescription for Colace to help keep your stools softer, so that, you don't suffer from constipation.  As discussed.  It is important that you eat a proper diet, as well as drink plenty of fluids and exercise on a regular basis.  Please make sure to call  Dr. Tanna Furry office for an appointment and further evaluation

## 2013-08-16 NOTE — ED Notes (Signed)
Patient refusing IV at this time.

## 2013-08-17 NOTE — ED Provider Notes (Signed)
Medical screening examination/treatment/procedure(s) were performed by non-physician practitioner and as supervising physician I was immediately available for consultation/collaboration.    Dot Lanes, MD 08/17/13 [phone]

## 2013-08-22 ENCOUNTER — Ambulatory Visit (INDEPENDENT_AMBULATORY_CARE_PROVIDER_SITE_OTHER): Payer: Self-pay | Admitting: Internal Medicine

## 2013-08-22 ENCOUNTER — Encounter: Payer: Self-pay | Admitting: Internal Medicine

## 2013-08-22 VITALS — BP 105/73 | HR 98 | Ht 70.0 in | Wt 267.4 lb

## 2013-08-22 DIAGNOSIS — I456 Pre-excitation syndrome: Secondary | ICD-10-CM

## 2013-08-22 MED ORDER — METOPROLOL TARTRATE 25 MG PO TABS: 25.0000 mg | ORAL_TABLET | Freq: Two times a day (BID) | ORAL | Status: DC

## 2013-08-22 NOTE — Progress Notes (Signed)
HPI Amanda Davenport returns today for followup. She is a pleasant 33 yo woman with a h/o tachypalpitations and SVT who I saw over a year ago. She was seen in the ER several days ago and told to followup today. She has done fairly well in the past with her SVT when she was on metoprolol but she has stopped taking her medications. No syncope.  Allergies  Allergen Reactions  . Other     All Antibiotics cause severe vaginal yeast infections  . Penicillins Hives, Itching and Swelling  . Shrimp [Shellfish Allergy] Hives     Current Outpatient Prescriptions  Medication Sig Dispense Refill  . diphenhydramine-acetaminophen (TYLENOL PM) 25-500 MG TABS Take 2 tablets by mouth at bedtime as needed.      Marland Kitchen ibuprofen (ADVIL,MOTRIN) 200 MG tablet Take 400 mg by mouth every 6 (six) hours as needed for moderate pain.       . ferrous sulfate 325 (65 FE) MG tablet Take 1 tablet (325 mg total) by mouth daily with breakfast.  1 tablet  3  . metoprolol tartrate (LOPRESSOR) 25 MG tablet Take 1 tablet (25 mg total) by mouth 2 (two) times daily.  180 tablet  3   No current facility-administered medications for this visit.     Past Medical History  Diagnosis Date  . Allergy   . Anemia   . Recurrent boils   . Nearsightedness     wears glasses  . Chronic headache   . Arrhythmia   . WPW (Wolff-Parkinson-White syndrome)     ROS:   All systems reviewed and negative except as noted in the HPI.   Past Surgical History  Procedure Laterality Date  . Cystectomy      tonsils     Family History  Problem Relation Age of Onset  . Breast cancer Mother   . Pulmonary embolism Mother     died of PE  . Diabetes Paternal Grandmother   . Heart disease Neg Hx   . Stroke Neg Hx   . Colon cancer Mother   . Irritable bowel syndrome Mother      History   Social History  . Marital Status: Single    Spouse Name: N/A    Number of Children: N/A  . Years of Education: N/A   Occupational History  .  Not on file.   Social History Main Topics  . Smoking status: Former Smoker -- .5 years    Types: Cigarettes  . Smokeless tobacco: Never Used     Comment: smokes black and milds  . Alcohol Use: Yes     Comment: occasionally  . Drug Use: No  . Sexual Activity: Not on file   Other Topics Concern  . Not on file   Social History Narrative  . No narrative on file     BP 105/73  Pulse 98  Ht 5\' 10"  (1.778 m)  Wt 267 lb 6.4 oz (121.292 kg)  BMI 38.37 kg/m2  LMP 07/19/2013  Physical Exam:  Well appearing 33 yo woman, NAD HEENT: Unremarkable Neck:  No JVD, no thyromegally Back:  No CVA tenderness Lungs:  Clear with no wheezes HEART:  Regular rate rhythm, no murmurs, no rubs, no clicks Abd:  soft, positive bowel sounds, no organomegally, no rebound, no guarding Ext:  2 plus pulses, no edema, no cyanosis, no clubbing Skin:  No rashes no nodules Neuro:  CN II through XII intact, motor grossly intact  EKG - nsr with  no pre-excitation   Assess/Plan:

## 2013-08-22 NOTE — Patient Instructions (Signed)
Your physician wants you to follow-up in: 12 months with Dr Knox Saliva will receive a reminder letter in the mail two months in advance. If you don't receive a letter, please call our office to schedule the follow-up appointment.   Your physician has recommended you make the following change in your medication:  1) Start Lopressor 25mg  twice daily

## 2013-08-22 NOTE — Assessment & Plan Note (Signed)
She has had symptoms off of medical therapy. I have recommended ablation but she is not inclined to pursue this and would like to continue on medical therapy. I will see her back in a year.

## 2013-08-29 ENCOUNTER — Emergency Department (HOSPITAL_COMMUNITY): Payer: Self-pay

## 2013-08-29 ENCOUNTER — Encounter (HOSPITAL_COMMUNITY): Payer: Self-pay | Admitting: Emergency Medicine

## 2013-08-29 ENCOUNTER — Emergency Department (HOSPITAL_COMMUNITY)
Admission: EM | Admit: 2013-08-29 | Discharge: 2013-08-29 | Disposition: A | Discharge status: Home / Self Care | Payer: Self-pay | Attending: Emergency Medicine | Admitting: Emergency Medicine

## 2013-08-29 ENCOUNTER — Telehealth: Payer: Self-pay | Admitting: Internal Medicine

## 2013-08-29 DIAGNOSIS — Z79899 Other long term (current) drug therapy: Secondary | ICD-10-CM | POA: Insufficient documentation

## 2013-08-29 DIAGNOSIS — G8929 Other chronic pain: Secondary | ICD-10-CM | POA: Insufficient documentation

## 2013-08-29 DIAGNOSIS — M25579 Pain in unspecified ankle and joints of unspecified foot: Secondary | ICD-10-CM | POA: Insufficient documentation

## 2013-08-29 DIAGNOSIS — M79671 Pain in right foot: Secondary | ICD-10-CM

## 2013-08-29 DIAGNOSIS — Z87891 Personal history of nicotine dependence: Secondary | ICD-10-CM | POA: Insufficient documentation

## 2013-08-29 DIAGNOSIS — Z872 Personal history of diseases of the skin and subcutaneous tissue: Secondary | ICD-10-CM | POA: Insufficient documentation

## 2013-08-29 DIAGNOSIS — D649 Anemia, unspecified: Secondary | ICD-10-CM | POA: Insufficient documentation

## 2013-08-29 DIAGNOSIS — Z8669 Personal history of other diseases of the nervous system and sense organs: Secondary | ICD-10-CM | POA: Insufficient documentation

## 2013-08-29 DIAGNOSIS — Z880 Allergy status to penicillin: Secondary | ICD-10-CM | POA: Insufficient documentation

## 2013-08-29 DIAGNOSIS — Z8679 Personal history of other diseases of the circulatory system: Secondary | ICD-10-CM | POA: Insufficient documentation

## 2013-08-29 MED ORDER — IBUPROFEN 400 MG PO TABS
800.0000 mg | ORAL_TABLET | Freq: Once | ORAL | Status: AC
  Administered 2013-08-29: 800 mg via ORAL
  Filled 2013-08-29: qty 2

## 2013-08-29 MED ORDER — IBUPROFEN 800 MG PO TABS: 800.0000 mg | ORAL_TABLET | Freq: Three times a day (TID) | ORAL | Status: DC

## 2013-08-29 NOTE — ED Notes (Signed)
Pt. reports right foot pain onset yesterday denies injury or fall.

## 2013-08-29 NOTE — Discharge Instructions (Signed)
Take Ibuprofen for pain twice daily with food  Use RICE method - see below  Your x-ray showed: Plantar calcaneal spurring Try obtaining more supportive foot wear with arch support  Return to the emergency department if you develop any changing/worsening condition, fever, weakness, redness/swelling, or any other concerns (please read additional information regarding your condition below)    Musculoskeletal Pain Musculoskeletal pain is muscle and boney aches and pains. These pains can occur in any part of the body. Your caregiver may treat you without knowing the cause of the pain. They may treat you if blood or urine tests, X-rays, and other tests were normal.  CAUSES There is often not a definite cause or reason for these pains. These pains may be caused by a type of germ (virus). The discomfort may also come from overuse. Overuse includes working out too hard when your body is not fit. Boney aches also come from weather changes. Bone is sensitive to atmospheric pressure changes. HOME CARE INSTRUCTIONS   Ask when your test results will be ready. Make sure you get your test results.  Only take over-the-counter or prescription medicines for pain, discomfort, or fever as directed by your caregiver. If you were given medications for your condition, do not drive, operate machinery or power tools, or sign legal documents for 24 hours. Do not drink alcohol. Do not take sleeping pills or other medications that may interfere with treatment.  Continue all activities unless the activities cause more pain. When the pain lessens, slowly resume normal activities. Gradually increase the intensity and duration of the activities or exercise.  During periods of severe pain, bed rest may be helpful. Lay or sit in any position that is comfortable.  Putting ice on the injured area.  Put ice in a bag.  Place a towel between your skin and the bag.  Leave the ice on for 15 to 20 minutes, 3 to 4 times a  day.  Follow up with your caregiver for continued problems and no reason can be found for the pain. If the pain becomes worse or does not go away, it may be necessary to repeat tests or do additional testing. Your caregiver may need to look further for a possible cause. SEEK IMMEDIATE MEDICAL CARE IF:  You have pain that is getting worse and is not relieved by medications.  You develop chest pain that is associated with shortness or breath, sweating, feeling sick to your stomach (nauseous), or throw up (vomit).  Your pain becomes localized to the abdomen.  You develop any new symptoms that seem different or that concern you. MAKE SURE YOU:   Understand these instructions.  Will watch your condition.  Will get help right away if you are not doing well or get worse. Document Released: 03/23/2005 Document Revised: 06/15/2011 Document Reviewed: 11/25/2012 [facility] Patient Information 2014 [place].  RICE: Routine Care for Injuries The routine care of many injuries includes Rest, Ice, Compression, and Elevation (RICE). HOME CARE INSTRUCTIONS  Rest is needed to allow your body to heal. Routine activities can usually be resumed when comfortable. Injured tendons and bones can take up to 6 weeks to heal. Tendons are the cord-like structures that attach muscle to bone.  Ice following an injury helps keep the swelling down and reduces pain.  Put ice in a plastic bag.  Place a towel between your skin and the bag.  Leave the ice on for 15-20 minutes, 03-04 times a day. Do this while awake, for the first  24 to 48 hours. After that, continue as directed by your caregiver.  Compression helps keep swelling down. It also gives support and helps with discomfort. If an elastic bandage has been applied, it should be removed and reapplied every 3 to 4 hours. It should not be applied tightly, but firmly enough to keep swelling down. Watch fingers or toes for swelling, bluish discoloration,  coldness, numbness, or excessive pain. If any of these problems occur, remove the bandage and reapply loosely. Contact your caregiver if these problems continue.  Elevation helps reduce swelling and decreases pain. With extremities, such as the arms, hands, legs, and feet, the injured area should be placed near or above the level of the heart, if possible. SEEK IMMEDIATE MEDICAL CARE IF:  You have persistent pain and swelling.  You develop redness, numbness, or unexpected weakness.  Your symptoms are getting worse rather than improving after several days. These symptoms may indicate that further evaluation or further X-rays are needed. Sometimes, X-rays may not show a small broken bone (fracture) until 1 week or 10 days later. Make a follow-up appointment with your caregiver. Ask when your X-ray results will be ready. Make sure you get your X-ray results. Document Released: 07/05/2000 Document Revised: 06/15/2011 Document Reviewed: 08/22/2010 [facility] Patient Information 2014 [place], [place].   Emergency Department Resource Guide 1) Find a Doctor and Pay Out of Pocket Although you won't have to find out who is covered by your insurance plan, it is a good idea to ask around and get recommendations. You will then need to call the office and see if the doctor you have chosen will accept you as a new patient and what types of options they offer for patients who are self-pay. Some doctors offer discounts or will set up payment plans for their patients who do not have insurance, but you will need to ask so you aren't surprised when you get to your appointment.  2) Contact Your Local Health Department Not all health departments have doctors that can see patients for sick visits, but many do, so it is worth a call to see if yours does. If you don't know where your local health department is, you can check in your phone book. The CDC also has a tool to help you locate your state's health department, and  many state websites also have listings of all of their local health departments.  3) Find a [facility] If your illness is not likely to be very severe or complicated, you may want to try a walk in clinic. These are popping up all over the country in pharmacies, drugstores, and shopping centers. They're usually staffed by nurse practitioners or physician assistants that have been trained to treat common illnesses and complaints. They're usually fairly quick and inexpensive. However, if you have serious medical issues or chronic medical problems, these are probably not your best option.  No Primary Care Doctor: - Call Health Connect at  [phone] - they can help you locate a primary care doctor that  accepts your insurance, provides certain services, etc. - Physician Referral Service- [phone]  Chronic Pain Problems: Organization         Address  Phone   Notes  [facility]  [phone] Patients need to be referred by their primary care doctor.   Medication Assistance: Organization         Address  Phone   Notes  [facility] Medication Assistance Program [place] [phone]  [place], [place] [address] [phone] --Must be a resident of [facility] -- Must have NO insurance coverage whatsoever (no Medicaid/ Medicare, etc.) -- The pt. MUST have a primary care doctor that directs their care regularly and follows them in the community   MedAssist  [phone]   [employer]  [phone]    Agencies that provide inexpensive medical care: Organization         Address  Phone   Notes  [place]  [phone]   [facility]    [phone]   [facility] [place], [place] [address] [phone]   [place] [address] [place] [phone]   Planned Parenthood    [phone]   [facility]    [phone]   [place]  and [place] [address], [place] Phone:  [phone], Fax:  [phone] Hours of Operation:  9 am - 6 pm, M-F.  Also accepts Medicaid/Medicare and self-pay.  [facility] for [place], [address], [place] Phone: [phone], Fax: [phone]. Hours of Operation:  8:30 am - 5:30 pm, M-F.  Also accepts Medicaid and self-pay.  [facility] [place] [address], [place] Phone: [phone]   [place], [place], [place] [phone], Ext. [phone] Mondays & Thursdays: 7-9 AM.  First 15 patients are seen on a first come, first serve basis.    [place] Providers:  Organization         Address  Phone   Notes  [facility] [address], [place] [phone] Also accepts self-pay patients.  [facility] [address], [place]  [phone]   [place], [address], [place] [phone]   [facility] [address], [place] [phone]   Lucianne Lei [address], [place]   [phone] Only accepts [place] Access [place] patients after they have their name applied to their card.   Self-Pay (no insurance) in [facility]:  Organization         Address  Phone   Notes  Sickle Cell Patients, [facility] [place] [phone]   [facility] [place] [phone]   [facility] [place]  [place], [place], [place] [phone]   [facility]/Dr. Osei-Bonsu  [address] [place] or [address], [place] [phone] Phone number for both [place] and [place] locations is the same.  Urgent Medical and [facility] [address], [place] [phone]   [place], [place] or [address] [phone]   [facility] [address], [place] [phone], phone; [phone], fax Sees patients 1st and 3rd Saturday of every month.  Must not qualify for public or private insurance (i.e. Medicaid, Medicare, [place] Health Choice, Veterans' Benefits)  Household income should be no more than 200% of the poverty level The clinic cannot treat you if you are pregnant or think you are pregnant  Sexually transmitted diseases are not treated at the clinic.    Dental Care: Organization  Address  Phone  Notes  [facility] [place], [place] [phone] Accepts children up to age 35 who are enrolled in [place] or [place]; pregnant women with a Medicaid card; and children who have applied for Medicaid or [place] Health Choice, but were declined, whose parents can pay a reduced fee at time of service.  [facility]  [address], [place] [phone] Accepts children up to age 39 who are enrolled in [place] or [place]; pregnant women with a Medicaid card; and children who have applied for Medicaid or [place] Health Choice, but were declined, whose parents can pay a reduced fee at time of service.  [place] Adult Dental Access PROGRAM   [phone] Patients are seen by appointment only. Walk-ins are not accepted. [place] will see patients 60 years of age and older. Monday - Tuesday (8am-5pm) Most Wednesdays (8:30-5pm) $30 per visit, cash only  [facility] Adult [facility] Access PRO[facility]  [address], [facility] [phone] Patients are seen by appointment only. Walk-ins are not accepted. [place] will see patients 2 years of age and older. One Wednesday Evening (Monthly: Volunteer Based).  $30 per visit, cash only   [place]  [phone] for adults; Children under age 6, call Graduate Pediatric Dentistry at [phone]. Children aged 19-14, please call [phone] to request a pediatric application.  Dental services are provided in all areas of dental care including fillings, crowns and bridges, complete and partial dentures, implants, gum treatment, root canals, and extractions. Preventive care is also provided. Treatment is provided to both adults and children. Patients are selected via a lottery and there is often a waiting list.   [facility] [address], [place]  [phone] [email or web address]   [facility] [address] [place], [place] [phone], Ext. [phone] Second and Fourth Thursday of each month, opens at 6:30 AM; Clinic ends at 9 AM.  Patients are seen on a first-come first-served basis, and a limited number are seen during each clinic.   [facility]  [address] [place] Danker [place], [place] [phone]   Eligibility Requirements You must have lived in [place], [place], or [place] counties for at least the last three months.   You cannot be eligible for state or federal sponsored [employer], including [employer], [place], or [employer].   You generally cannot be eligible for healthcare insurance through your employer.    How to apply: Eligibility screenings are held every Tuesday and Wednesday afternoon from 1:00 pm until 4:00 pm. You do not need an appointment for the interview!  [facility] [address] [place], [place]   Mayo  [place] Department  [place]  [phone]    Behavioral Health Resources in the Community: Intensive Outpatient Programs Organization         Address  Phone  Notes  [place] . [address] [place],  [place] [phone]   [facility] Outpatient [address], [place], [place]   ADS: Alcohol & Drug Svcs [address] [place], [place]   [place] [address],  [place], [place] or [phone]   Substance Abuse Resources  Organization         Address  Phone  Notes  Alcohol and Drug Services  [place]  [phone]   [facility] [place]  [phone]   [place]inita Pester  [phone]   Residential & Outpatient Substance Abuse Program  [phone]   Psychological Services Organization         Address  Phone  Notes  [facility] [place]  [place]  [phone]   [place] [address] [place] or [phone]    Mobile Crisis Teams Organization         Address  Phone  Notes  [facility], Mobile Crisis Care Unit  [phone]   [facility]  [address] [place], [place]   [place] Levels [address] [place] [phone]    Self-Help/Support Groups Organization         Address  Phone             Notes  [place] of [place] - variety of support groups  [place] Call for more information  Narcotics Anonymous (NA), [facility] [address], [employer] [place]  2 meetings at this location   Special educational needs teacher         Address  Phone  Notes  [facility]idential [facility] [place],    [place]  [phone]   [facility]  [address] [place] [phone], [place], [place]   [place], [place] [phone] Admissions: 8am-3pm M-F  Incentives Substance [place] [address],    [place], [place] [phone]   [facility] [address] [place], [place], [place]   [facility] [name]  [place], [place]   [facility] - Intensive Outpatient [address], [place], [place]   [facility] ([place].,  [place], [place] [phone] or [phone]   [facility] (RTS) [address], [place], [place] Accepts Medicaid  [facility] [place] [address],  [place] [phone] Substance Abuse/Addiction Treatment   [facility] Organization         Address  Phone  Notes  [facility]  [phone]   Domenic Schwab, PhD [address] Arlis Port[place], [place]   [phone] or [phone]   [place], [place] [phone]   [facility] [address] [place], [place] [phone] Insurance/Medicaid/sponsorship through [facility] and Families [address]                                    [place], [place] [phone] [place] [address] [place], [place] [phone]    Dr. Adele Schilder  [phone]   [facility] Dept. 1) [address], [place] 2) [place] 3)  [place] [address], [place] [phone]  [phone]   [place] [phone] or [phone] (After Hours)

## 2013-08-29 NOTE — ED Provider Notes (Signed)
CSN: [id number]     Arrival date & time 08/29/13  2014 History   First MD Initiated Contact with Patient 08/29/13 2106     This chart was scribed for non-physician practitioner, Vernie Murders, PA-C working with Orpah Greek, * by Forrestine Him, ED Scribe. This patient was seen in room TR06C/TR06C and the patient's care was started at 10:52 PM.   Chief Complaint  Patient presents with  . Foot Pain   The history is provided by the patient. No language interpreter was used.    HPI Comments: Amanda Davenport is a 33 y.o. Female with a PMH of WPW, chronic headaches, recurrent boils, anemia and allergies who presents to the Emergency Department complaining of constant, moderate aching R foot pain x 1 day that is progressively worsening. She denies any new injury or trauma. This pain is exacerbated with ambulation and palpation of the bottom of the R foot. Pain located mostly in the arch and ball of the foot. No alleviating factors at this time, but it "feels better to walk on the outside of my foot." She has tried OTC "back and body" without any noticeable improvement. She denies any fever, chills, weakness, numbness, or loss of sensation. No calf pain. 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 She has no other pertinent past medical history. No other concerns this visit.   Past Medical History  Diagnosis Date  . Allergy   . Anemia   . Recurrent boils   . Nearsightedness     wears glasses  . Chronic headache   . Arrhythmia   . WPW (Wolff-Parkinson-White syndrome)    Past Surgical History  Procedure Laterality Date  . Cystectomy      tonsils   Family History  Problem Relation Age of Onset  . Breast cancer Mother   . Pulmonary embolism Mother     died of PE  . Diabetes Paternal Grandmother   . Heart disease Neg Hx   . Stroke Neg Hx   . Colon cancer Mother   . Irritable bowel  syndrome Mother    History  Substance Use Topics  . Smoking status: Former Smoker -- .5 years    Types: Cigarettes  . Smokeless tobacco: Never Used     Comment: smokes black and milds  . Alcohol Use: Yes     Comment: occasionally   OB History   Grav Para Term Preterm Abortions TAB SAB Ect Mult Living   0              Review of Systems  Constitutional: Negative for fever, chills, activity change, appetite change and fatigue.  Cardiovascular: Negative for leg swelling.  Musculoskeletal: Positive for arthralgias (R foot). Negative for back pain, gait problem, joint swelling and myalgias.  Skin: Negative for rash and wound.  Neurological: Negative for weakness and numbness.  Psychiatric/Behavioral: Negative for confusion.    Allergies  Other; Penicillins; and Shrimp  Home Medications   Prior to Admission medications   Medication Sig Start Date End Date Taking? Authorizing Provider  diphenhydramine-acetaminophen (TYLENOL PM) 25-500 MG TABS Take 2 tablets by mouth at bedtime as needed (sleep).    Yes Historical Provider, MD  ferrous sulfate 325 (65 FE) MG tablet Take 1 tablet (325 mg total) by mouth daily with breakfast. 08/16/13  Yes Garald Balding, NP  ibuprofen (ADVIL,MOTRIN) 200 MG tablet Take 400 mg by  mouth every 6 (six) hours as needed for moderate pain.    Yes Historical Provider, MD  metoprolol tartrate (LOPRESSOR) 25 MG tablet Take 1 tablet (25 mg total) by mouth 2 (two) times daily. 08/22/13  Yes Evans Lance, MD   Triage Vitals: BP 97/61  Pulse 92  Temp(Src) 99 F (37.2 C) (Oral)  Resp 16  SpO2 99%  LMP 08/21/2013   Filed Vitals:   08/29/13 2019 08/29/13 2302  BP: 97/61 113/64  Pulse: 92 94  Temp: 99 F (37.2 C) 98.6 F (37 C)  TempSrc: Oral Oral  Resp: 16 17  SpO2: 99% 100%    Physical Exam  Nursing note and vitals reviewed. Constitutional: She is oriented to person, place, and time. She appears well-developed and well-nourished. No distress.  HENT:   Head: Normocephalic and atraumatic.  Right Ear: External ear normal.  Left Ear: External ear normal.  Mouth/Throat: Oropharynx is clear and moist.  Eyes: Conjunctivae are normal. Right eye exhibits no discharge. Left eye exhibits no discharge.  Neck: Normal range of motion.  Cardiovascular: Normal rate.   Dorsalis pedis pulses present and equal bilaterally  Pulmonary/Chest: Effort normal. No respiratory distress.  Abdominal: She exhibits no distension.  Musculoskeletal: Normal range of motion. She exhibits tenderness. She exhibits no edema.       Feet:  Diffuse tenderness to palpation to the arch and forefoot with no focal tenderness. Mild pes planus. ROM intact in the right foot. No dorsal foot, digit, ankle, calf, or knee tenderness to palpation on the right. Patient able to ambulate however has a mild antalgic gait due to pain. Pain improved when walking on the lateral sides of the right foot relieving pressure off or the arch and medial foot.   Neurological: She is alert and oriented to person, place, and time.  Sensation intact in the feet bilaterally  Skin: Skin is warm and dry. She is not diaphoretic.  No edema, erythema, ecchymosis or wounds to the feet throughout  Psychiatric: She has a normal mood and affect.    ED Course  Procedures (including critical care time)  DIAGNOSTIC STUDIES: Oxygen Saturation is 99% on RA, Normal by my interpretation.    COORDINATION OF CARE: 10:55 PM- Will order X-Ray. Will give pain medication to manage symptoms. Discussed treatment plan with pt at bedside and pt agreed to plan.     Labs Review Labs Reviewed - No data to display  Imaging Review Dg Foot Complete Right  08/29/2013   CLINICAL DATA:  RIGHT foot pain at metatarsal region for 1 day, no known injury  EXAM: RIGHT FOOT COMPLETE - 3+ VIEW  COMPARISON:  None  FINDINGS: Osseous mineralization grossly normal for technique.  Joint spaces preserved.  Plantar calcaneal spur.  No acute  fracture, dislocation or bone destruction.  IMPRESSION: No acute osseous abnormalities.  Plantar calcaneal spurring.   Electronically Signed   By: Lavonia Dana M.D.   On: 08/29/2013 21:46     EKG Interpretation None      MDM   Amanda Davenport is a 33 y.o. female with a PMH of WPW, chronic headaches, recurrent boils, anemia and allergies who presents to the Emergency Department complaining of constant, moderate R foot pain x 1 day that is progressively worsening. Etiology of foot pain possibly due to poor arch support exacerbated by working several hours on her feet a day on poorly supported shoes vs plantar fasciitis. Patient has had plantar fasciitis in the past. X-rays negative for  fracture or malalignment however incidentally showed a plantar calcaneal spurring. No hx of trauma. Patient neurovascularly intact. No evidence of an infectious process. Patient afebrile and non-toxic in appearance. Vital signs stable.  RICE method discussed with patient. Proper fitted shoes and arch support advised. Follow-up if not improving. Return precautions, discharge instructions, and follow-up was discussed with the patient before discharge.      Discharge Medication List as of 08/29/2013 11:00 PM    START taking these medications   Details  !! ibuprofen (ADVIL,MOTRIN) 800 MG tablet Take 1 tablet (800 mg total) by mouth 3 (three) times daily., Starting 08/29/2013, Until Discontinued, Print     !! - Potential duplicate medications found. Please discuss with provider.       Final impressions: 1. Right foot pain      Denman George   I personally performed the services described in this documentation, which was scribed in my presence. The recorded information has been reviewed and is accurate.    Lucila [place], PA-C 08/30/13 [phone]

## 2013-08-29 NOTE — Telephone Encounter (Signed)
° °   New problem     Pt called to get her Ejection fraction for Disability application pt would like a call back with this information.

## 2013-08-29 NOTE — Telephone Encounter (Signed)
Left message for her that we do not have an EF on her.  That the condition we have treated her for is SVT and we have an EKG but no echo.  If wanting disability they will request records and we can send  I asked her to call back with further questions

## 2013-08-29 NOTE — Telephone Encounter (Signed)
° ° 

## 2013-08-31 NOTE — ED Provider Notes (Signed)
Medical screening examination/treatment/procedure(s) were performed by non-physician practitioner and as supervising physician I was immediately available for consultation/collaboration.  Orpah Greek, MD 08/31/13 1640

## 2013-09-10 ENCOUNTER — Encounter (HOSPITAL_COMMUNITY): Payer: Self-pay | Admitting: Emergency Medicine

## 2013-09-10 ENCOUNTER — Emergency Department (HOSPITAL_COMMUNITY)
Admission: EM | Admit: 2013-09-10 | Discharge: 2013-09-10 | Disposition: A | Discharge status: Home / Self Care | Payer: Self-pay | Attending: Emergency Medicine | Admitting: Emergency Medicine

## 2013-09-10 DIAGNOSIS — E669 Obesity, unspecified: Secondary | ICD-10-CM | POA: Insufficient documentation

## 2013-09-10 DIAGNOSIS — D649 Anemia, unspecified: Secondary | ICD-10-CM | POA: Insufficient documentation

## 2013-09-10 DIAGNOSIS — Z8679 Personal history of other diseases of the circulatory system: Secondary | ICD-10-CM | POA: Insufficient documentation

## 2013-09-10 DIAGNOSIS — Z87891 Personal history of nicotine dependence: Secondary | ICD-10-CM | POA: Insufficient documentation

## 2013-09-10 DIAGNOSIS — Z880 Allergy status to penicillin: Secondary | ICD-10-CM | POA: Insufficient documentation

## 2013-09-10 DIAGNOSIS — Z8669 Personal history of other diseases of the nervous system and sense organs: Secondary | ICD-10-CM | POA: Insufficient documentation

## 2013-09-10 DIAGNOSIS — G8929 Other chronic pain: Secondary | ICD-10-CM | POA: Insufficient documentation

## 2013-09-10 DIAGNOSIS — K047 Periapical abscess without sinus: Secondary | ICD-10-CM | POA: Insufficient documentation

## 2013-09-10 DIAGNOSIS — Z79899 Other long term (current) drug therapy: Secondary | ICD-10-CM | POA: Insufficient documentation

## 2013-09-10 DIAGNOSIS — Z872 Personal history of diseases of the skin and subcutaneous tissue: Secondary | ICD-10-CM | POA: Insufficient documentation

## 2013-09-10 HISTORY — DX: Obesity, unspecified: E66.9

## 2013-09-10 MED ORDER — IBUPROFEN 600 MG PO TABS: 600.0000 mg | ORAL_TABLET | Freq: Four times a day (QID) | ORAL | Status: DC | PRN

## 2013-09-10 MED ORDER — HYDROCODONE-ACETAMINOPHEN 5-325 MG PO TABS: 1.0000 | ORAL_TABLET | Freq: Four times a day (QID) | ORAL | Status: DC | PRN

## 2013-09-10 MED ORDER — CLINDAMYCIN HCL 150 MG PO CAPS: 150.0000 mg | ORAL_CAPSULE | Freq: Four times a day (QID) | ORAL | Status: DC

## 2013-09-10 MED ORDER — CLINDAMYCIN HCL 150 MG PO CAPS
300.0000 mg | ORAL_CAPSULE | Freq: Once | ORAL | Status: AC
  Administered 2013-09-10: 300 mg via ORAL
  Filled 2013-09-10: qty 2

## 2013-09-10 MED ORDER — OXYCODONE-ACETAMINOPHEN 5-325 MG PO TABS
1.0000 | ORAL_TABLET | Freq: Once | ORAL | Status: AC
  Administered 2013-09-10: 1 via ORAL
  Filled 2013-09-10: qty 1

## 2013-09-10 NOTE — Discharge Instructions (Signed)
Abscessed Tooth  An abscessed tooth is an infection around your tooth. It may be caused by holes or damage to the tooth (cavity) or a dental disease. An abscessed tooth causes mild to very bad pain in and around the tooth. See your dentist right away if you have tooth or gum pain.  HOME CARE   Take your medicine as told. Finish it even if you start to feel better.   Do not drive after taking pain medicine.   Rinse your mouth (gargle) often with salt water ( teaspoon salt in 8 ounces of warm water).   Do not apply heat to the outside of your face.  GET HELP RIGHT AWAY IF:    You have a temperature by mouth above 102 F (38.9 C), not controlled by medicine.   You have chills and a very bad headache.   You have problems breathing or swallowing.   Your mouth will not open.   You develop puffiness (swelling) on the neck or around the eye.   Your pain is not helped by medicine.   Your pain is getting worse instead of better.  MAKE SURE YOU:    Understand these instructions.   Will watch your condition.   Will get help right away if you are not doing well or get worse.  Document Released: 09/09/2007 Document Revised: 06/15/2011 Document Reviewed: 07/01/2010  [employer] Patient Information 2014 [employer].

## 2013-09-10 NOTE — ED Notes (Signed)
Pt. reports persistent right upper molar pain / swelling onset last Saturday unrelieved by OTC Ibuprofen .

## 2013-09-10 NOTE — ED Provider Notes (Signed)
CSN: [id number]     Arrival date & time 09/10/13  2031 History  This chart was scribed for non-physician practitioner working with Tanna Furry, MD, by Jeanell Sparrow, ED Scribe. This patient was seen in room TR11C/TR11C and the patient's care was started at 9:07 PM  Chief Complaint  Patient presents with  . Dental Pain   Patient is a 33 y.o. female presenting with tooth pain. The history is provided by the patient. No language interpreter was used.  Dental Pain Location:  Upper Upper teeth location:  3/RU 1st molar and 4/RU 2nd bicuspid Quality:  Throbbing Onset quality:  Gradual Duration:  2 days Timing:  Constant Progression:  Worsening Chronicity:  New Context: abscess and dental caries   Worsened by:  Cold food/drink, touching and pressure Ineffective treatments:  Acetaminophen and NSAIDs Associated symptoms: facial pain, facial swelling and gum swelling    HPI Comments: Amanda Davenport is a 33 y.o. female with a hx of dental problems who presents to the Emergency Department complaining of constant, moderate dental pain that started yesterday. The pain is on the first molar on the right upper mouth. She took ibuprofen with no relief. The pain is much worse today with gum swelling and drainage.   Past Medical History  Diagnosis Date  . Allergy   . Anemia   . Recurrent boils   . Nearsightedness     wears glasses  . Chronic headache   . Arrhythmia   . WPW (Wolff-Parkinson-White syndrome)   . Obesity    Past Surgical History  Procedure Laterality Date  . Cystectomy      tonsils   Family History  Problem Relation Age of Onset  . Breast cancer Mother   . Pulmonary embolism Mother     died of PE  . Diabetes Paternal Grandmother   . Heart disease Neg Hx   . Stroke Neg Hx   . Colon cancer Mother   . Irritable bowel syndrome Mother    History  Substance Use Topics  . Smoking status: Former Smoker -- .5 years    Types: Cigarettes  . Smokeless tobacco: Never Used      Comment: smokes black and milds  . Alcohol Use: Yes     Comment: occasionally   OB History   Grav Para Term Preterm Abortions TAB SAB Ect Mult Living   0              Review of Systems  HENT: Positive for dental problem and facial swelling.   All other systems reviewed and are negative.     Allergies  Other; Penicillins; and Shrimp  Home Medications   Prior to Admission medications   Medication Sig Start Date End Date Taking? Authorizing Provider  clindamycin (CLEOCIN) 150 MG capsule Take 1 capsule (150 mg total) by mouth every 6 (six) hours. 09/10/13   Hope Bunnie Pion, NP  diphenhydramine-acetaminophen (TYLENOL PM) 25-500 MG TABS Take 2 tablets by mouth at bedtime as needed (sleep).     Historical Provider, MD  ferrous sulfate 325 (65 FE) MG tablet Take 1 tablet (325 mg total) by mouth daily with breakfast. 08/16/13   Garald Balding, NP  HYDROcodone-acetaminophen (NORCO) 5-325 MG per tablet Take 1 tablet by mouth every 6 (six) hours as needed for moderate pain. 09/10/13   Hope Bunnie Pion, NP  ibuprofen (ADVIL,MOTRIN) 600 MG tablet Take 1 tablet (600 mg total) by mouth every 6 (six) hours as needed. 09/10/13   [place],  NP  metoprolol tartrate (LOPRESSOR) 25 MG tablet Take 1 tablet (25 mg total) by mouth 2 (two) times daily. 08/22/13   Evans Lance, MD   BP 107/64  Pulse 86  Temp(Src) 99.2 F (37.3 C) (Oral)  Resp 14  Ht 5\' 8"  (1.727 m)  Wt 269 lb (122.018 kg)  BMI 40.91 kg/m2  SpO2 98%  LMP 08/21/2013 Physical Exam  Nursing note and vitals reviewed. Constitutional: She is oriented to person, place, and time. She appears well-developed and well-nourished.  HENT:  Head: Normocephalic and atraumatic.  Mouth/Throat: Uvula is midline, oropharynx is clear and moist and mucous membranes are normal.    Uvulas midline. Erythema on gumline with abscess that is draining purulent drainage.  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 Facial swelling on the right side. Tender on right side of  face.  Eyes: Conjunctivae and EOM are normal.  Neck: Neck supple.  Cardiovascular: Normal rate.   Pulmonary/Chest: Effort normal.  Musculoskeletal: Normal range of motion.  Lymphadenopathy:    She has cervical adenopathy.  Neurological: She is alert and oriented to person, place, and time. No cranial nerve deficit.  Skin: Skin is warm and dry.  Psychiatric: She has a normal mood and affect. Her behavior is normal.    ED Course  Procedures (including critical care time) DIAGNOSTIC STUDIES: Oxygen Saturation is 98% on RA, normal by my interpretation.    COORDINATION OF CARE: 9:11 PM- Medications will be ordered and pt is advised to follow up with dentist. Pt's parents advised of plan for treatment. Parents verbalize understanding and agreement with plan.  Labs Review  MDM  33 y.o. female with dental pain and abscess. Will treat with antibiotics and pain medication. She will follow up with a dentist as soon as possible. Stable for discharge without fever or signs of sepsis. Information given to the patient regarding dental clinic and follow up. Discussed with the patient and all questioned fully answered. She will retutn if any problems arise.    Medication List    TAKE these medications       clindamycin 150 MG capsule  Commonly known as:  CLEOCIN  Take 1 capsule (150 mg total) by mouth every 6 (six) hours.     HYDROcodone-acetaminophen 5-325 MG per tablet  Commonly known as:  NORCO  Take 1 tablet by mouth every 6 (six) hours as needed for moderate pain.     ibuprofen 600 MG tablet  Commonly known as:  ADVIL,MOTRIN  Take 1 tablet (600 mg total) by mouth every 6 (six) hours as needed.      ASK your doctor about these medications       diphenhydramine-acetaminophen 25-500 MG Tabs  Commonly known as:  TYLENOL PM  Take 2 tablets by mouth at bedtime as needed (sleep).     ferrous sulfate 325 (65 FE) MG tablet  Take 1 tablet (325 mg total) by mouth daily with breakfast.      metoprolol tartrate 25 MG tablet  Commonly known as:  LOPRESSOR  Take 1 tablet (25 mg total) by mouth 2 (two) times daily.        I personally performed the services described in this documentation, which was scribed in my presence. The recorded information has been reviewed and is accurate.     [place], [place] 09/10/13 2158

## 2013-09-21 NOTE — ED Provider Notes (Signed)
Medical screening examination/treatment/procedure(s) were performed by non-physician practitioner and as supervising physician I was immediately available for consultation/collaboration.   EKG Interpretation None        Tanna Furry, MD 09/21/13 [phone]

## 2013-09-22 ENCOUNTER — Encounter (HOSPITAL_COMMUNITY): Payer: Self-pay | Admitting: Emergency Medicine

## 2013-09-22 ENCOUNTER — Emergency Department (INDEPENDENT_AMBULATORY_CARE_PROVIDER_SITE_OTHER)
Admission: EM | Admit: 2013-09-22 | Discharge: 2013-09-22 | Disposition: A | Discharge status: Home / Self Care | Payer: Self-pay | Source: Home / Self Care | Attending: Family Medicine | Admitting: Family Medicine

## 2013-09-22 ENCOUNTER — Emergency Department (INDEPENDENT_AMBULATORY_CARE_PROVIDER_SITE_OTHER): Payer: Self-pay

## 2013-09-22 DIAGNOSIS — M7750 Other enthesopathy of unspecified foot: Secondary | ICD-10-CM

## 2013-09-22 DIAGNOSIS — M65849 Other synovitis and tenosynovitis, unspecified hand: Secondary | ICD-10-CM

## 2013-09-22 DIAGNOSIS — M7741 Metatarsalgia, right foot: Secondary | ICD-10-CM

## 2013-09-22 DIAGNOSIS — M65839 Other synovitis and tenosynovitis, unspecified forearm: Secondary | ICD-10-CM

## 2013-09-22 DIAGNOSIS — M778 Other enthesopathies, not elsewhere classified: Secondary | ICD-10-CM

## 2013-09-22 MED ORDER — PREDNISONE 10 MG PO KIT: PACK | ORAL | Status: DC

## 2013-09-22 NOTE — ED Notes (Signed)
Pt assessed and triaged by provider.   Provider in before nurse.

## 2013-09-22 NOTE — Discharge Instructions (Signed)
Thank you for coming in today. Purchase metatarsal pads online.  Bring them here when I am here.  Next Week Wednesday-Friday all day. 8am best time. Ask for me.  Bring your work shoes with you.   For your wrist use the brace and take the prednisone.  Use the brace for a few days to 1 week.  Come back as needed.     Extensor Carpi Ulnaris Tendinitis with Rehab Tendonitis involves inflammation of a tendon, which is a soft tissue that connects muscle to bone. The Extensor Carpi Ulnaris (ECU) tendon is vulnerable to tendonitis. The ECU is responsible for straightening and rotating (abducting) the wrist. The ECU is important for gripping and pulling. ECU tendonitis may include inflammation of the ECU tendon lining (sheath). The tendon sheath usually secretes a fluid that allows the tendon to function smoothly, but when it becomes inflamed, function is impaired. This condition may also include a tear in the ECU tendon or muscle (strain). The strain may be classified as a grade 1 or 2 strain. Grade 1 strains cause pain, but the tendon is not lengthened. Grade 2 strains include a lengthened ligament, due to being stretched or partially ruptured. With grade 2 strains there is still function, although the function may be reduced. SYMPTOMS   Pain, tenderness, swelling, warmth, or redness on the little finger side of the wrist.  Pain that worsens when straightening the wrist or bending it toward the little finger.  Pain with gripping.  Limited motion of the wrist.  Crackling sound (crepitation) when the tendon or wrist is moved or touched. CAUSES  ECU tendonitis is caused by injury to the ECU tendon. It is usually due to chronic or repetitive injuries, but may be due to sharp (acute) injury. Common causes of injury include:  Strain from unusual use, overuse, or increase in activity, or change in activity of the wrist, hand, or forearm.  Direct hit (trauma) to the muscles and tendon on the side of  the wrist.  Repetitive motions of the hand and wrist, due to friction of the tendon within the tendon lining. RISK INCREASES WITH:  Sports that involve repetitive hand and wrist motions (i.e. golfing, bowling).  Sports that require gripping (i.e. tennis, golf, weightlifting).  Heavy labor.  Poor wrist and forearm strength and flexibility.  Failure to warm-up properly before activity. PREVENTION   Warm up and stretch properly before activity.  Allow the body to recover between activities.  Maintain physical fitness:  Strength, flexibility, and endurance.  Cardiovascular fitness.  Learn and use proper exercise technique. PROGNOSIS  If treated properly, ECU tendonitis is usually curable within 6 weeks.  RELATED COMPLICATIONS   Longer healing time, if not properly treated or if not given adequate time to heal.  Chronically inflamed tendon, causing persistent pain with activity. This may progress to constant pain, restriction of motion, and potentially tearing of the tendon.  Recurring symptoms, especially if activity is resumed too soon.  Risks of surgery: infection, bleeding, injury to nerves, continued pain, incomplete release of the tendon lining, recurring symptoms, cutting of the tendon, and weakness of the wrist and grip. TREATMENT  Treatment initially involves the use of ice and medicine, to help reduce pain and inflammation. Performing stretching and strengthening exercises regularly is important for a quick recovery. These exercises may be completed at home or with a therapist. Your caregiver may recommend the use of a brace or splint to reduce motions that aggravate symptoms. Corticosteroid injections may be recommended. If  non-surgical treatment is unsuccessful, then surgery may be needed.  MEDICATION   If pain medicine is needed, nonsteroidal anti-inflammatory medicines (aspirin and ibuprofen), or other minor pain relievers (acetaminophen), are often  recommended.  Do not take pain medicine for 7 days before surgery.  Prescription pain relievers may be given if your caregiver thinks they are needed. Use only as directed and only as much as you need.  Corticosteroid injections may be given to reduce inflammation. However, these injections should be reserved for serious cases, as they may only be given a certain number of times. COLD THERAPY  Cold treatment (icing) relieves pain and reduces inflammation. Cold treatment should be applied for 10 to 15 minutes every 2 to 3 hours, and immediately after activity that aggravates your symptoms. Use ice packs or an ice massage. SEEK MEDICAL CARE IF:   Symptoms get worse or do not improve in 2 weeks, despite treatment.  You experience pain, numbness, or coldness in the hand.  Blue, gray, or dark color appears in the fingernails.  Any of the following occur after surgery: increased pain, swelling, redness, drainage of fluids, bleeding in the surgical area, or signs of infection.  New, unexplained symptoms develop. (Drugs used in treatment may produce side effects.) EXERCISES RANGE OF MOTION (ROM) AND STRETCHING EXERCISES - Extensor Carpi Ulnaris Tendinitis These are some of the initial exercises you may start your recovery program with, until you see your caregiver again or until your symptoms are resolved. Remember:  Flexible tissue is more tolerant of the stresses placed on it during activity.  Each stretch should be held for 20 to 30 seconds.  A gentle stretching sensation should be felt. RANGE OF MOTION - Wrist Flexion  Hold your right / left wrist with the fingers pointing down toward the floor.  Pull down on the wrist until you feel a stretch.  Hold this position for __________ seconds. Repeat exercise __________ times, __________ times per day.  This exercise should be done with the elbow: _____ Bent to 90 degrees. _____ Straight. RANGE OF MOTION - Wrist Flexion  Place the  back of your right / left hand flat on the top of a table. Your shoulder should be turned in and your fingers facing away from your body.  Press down, bending your wrist and straightening your elbow until your feel a stretch.  Hold this position for __________ seconds.  Repeat exercise __________ times, __________ times per day. STRENGTHENING EXERCISES - Extensor Carpi Ulnaris Tendinitis These are some of the initial exercises you may start your recovery program with, until you see your caregiver again or until your symptoms are resolved. Remember:  Strong muscles with good endurance tolerate stress better.  Do the exercises as initially prescribed by your caregiver. Progress slowly with each exercise, gradually increasing the number of repetitions and weight used, only as guided. RANGE OF MOTION - Wrist Extensors  Sit or stand with your forearm supported.  Using a __________ weight or a piece of rubber band or tubing, bend your wrist slowly upward toward you.  Hold this position for __________ seconds and then slowly lower the wrist back to the starting position.  Repeat exercise __________ times, __________ times per day. RANGE OF MOTION - Wrist, Ulnar Deviation  Stand with a hammer in your hand, or sit while holding onto a rubber band or tubing with both hands, and with your arm supported on a table.  Raise your hand with the hammer upward behind you, or pull down  on the rubber tubing.  Hold this position for __________ seconds and then slowly lower the wrist back to the starting position.  Repeat exercise __________ times, __________ times per day. STRENGTH - Grip  Hold a wad of putty, soft modeling clay, a large sponge, a soft rubber ball or a soft tennis ball in your hand.  Squeeze as hard as you can.  Hold this position for __________ seconds.  Repeat exercise __________ times, __________ times per day. Document Released: 03/23/2005 Document Revised: 06/15/2011 Document  Reviewed: 07/05/2008 [facility] Patient Information 2015 [place], [place]. This information is not intended to replace advice given to you by your health care provider. Make sure you discuss any questions you have with your health care provider.

## 2013-09-22 NOTE — ED Provider Notes (Signed)
Amanda Davenport is a 33 y.o. female who presents to Urgent Care today for left hand and wrist pain and right foot pain. 1) left wrist: Patient has significant pain in the left ulnar hand and wrist occurring over the past one and a half weeks. Patient denies any injury. The pain is worse with wrist flexion and finger extension. She has tried some ibuprofen which has not helped. She uses her hands significantly at work.  2) foot pain. Patient has pain at the plantar right second through fourth metatarsal heads. Pain is worse with activity and better with rest. This is been ongoing now for several months. No fevers or chills nausea vomiting or diarrhea. She has had multiple evaluations with no clear diagnosis in the emergency room for this issue. She has tried multiple over-the-counter medications which have not helped.   Past Medical History  Diagnosis Date  . Allergy   . Anemia   . Recurrent boils   . Nearsightedness     wears glasses  . Chronic headache   . Arrhythmia   . WPW (Wolff-Parkinson-White syndrome)   . Obesity    History  Substance Use Topics  . Smoking status: Former Smoker -- .5 years    Types: Cigarettes  . Smokeless tobacco: Never Used     Comment: smokes black and milds  . Alcohol Use: Yes     Comment: occasionally   ROS as above Medications: No current facility-administered medications for this encounter.   Current Outpatient Prescriptions  Medication Sig Dispense Refill  . ferrous sulfate 325 (65 FE) MG tablet Take 1 tablet (325 mg total) by mouth daily with breakfast.  1 tablet  3  . metoprolol tartrate (LOPRESSOR) 25 MG tablet Take 1 tablet (25 mg total) by mouth 2 (two) times daily.  180 tablet  3  . PredniSONE 10 MG KIT 12 day dose pack po  1 kit  0  . [DISCONTINUED] diphenhydramine-acetaminophen (TYLENOL PM) 25-500 MG TABS Take 2 tablets by mouth at bedtime as needed (sleep).         Exam:  BP 136/79  Pulse 87  Temp(Src) 98.2 F (36.8 C) (Oral)  Resp  16  SpO2 100%  LMP 08/31/2013 Gen: Well NAD Left wrist: Normal-appearing no swelling or erythema. Tender palpation along the dorsal or wrist and hand. Pain with wrist flexion and finger extension. Capillary refill pulses and sensation are intact. Contralateral right wrist normal-appearing nontender with normal motion. 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 Pulses capillary refill sensation are intact. Tender palpation of the second and third metatarsal heads. Negative squeeze test. Contralateral left foot also demonstrates pes planus with collapse of longitudinal arch. Nontender. Pulses and capillary refill and sensation are intact.  No results found for this or any previous visit (from the past 24 hour(s)). Dg Hand Complete Left  09/22/2013   CLINICAL DATA:  LEFT hand pain, no injury  EXAM: LEFT HAND - COMPLETE 3+ VIEW  COMPARISON:  None  FINDINGS: Osseous mineralization normal.  Joint spaces preserved.  No fracture, dislocation, or bone destruction.  IMPRESSION: Normal exam.   Electronically Signed   By: Lavonia Dana M.D.   On: 09/22/2013 19:41    Assessment and Plan: 33 y.o. female with  1) left wrist: Overuse tenosynovitis of the dorsal extensor tendons. 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 Plan to treat with wrist brace and prednisone dosepak as well as home exercise program. 2) right foot pain: Very likely to be  metatarsalgia. Metatarsal stress fracture is possible but less likely. Morton's neuroma is very unlikely. Patient will purchase metatarsal pads and return to clinic for me to place him in her shoes correctly.  Discussed warning signs or symptoms. Please see discharge instructions. Patient expresses understanding.    Gregor Hams, MD 09/22/13 2011

## 2013-11-26 IMAGING — CR DG ANKLE COMPLETE 3+V*L*
3 series · 3 of 3 positions shown · non-contrast
Comparison: None.

CLINICAL DATA: Joint swelling no known injury

LEFT ANKLE COMPLETE - 3+ VIEW

[x ankle ap left]
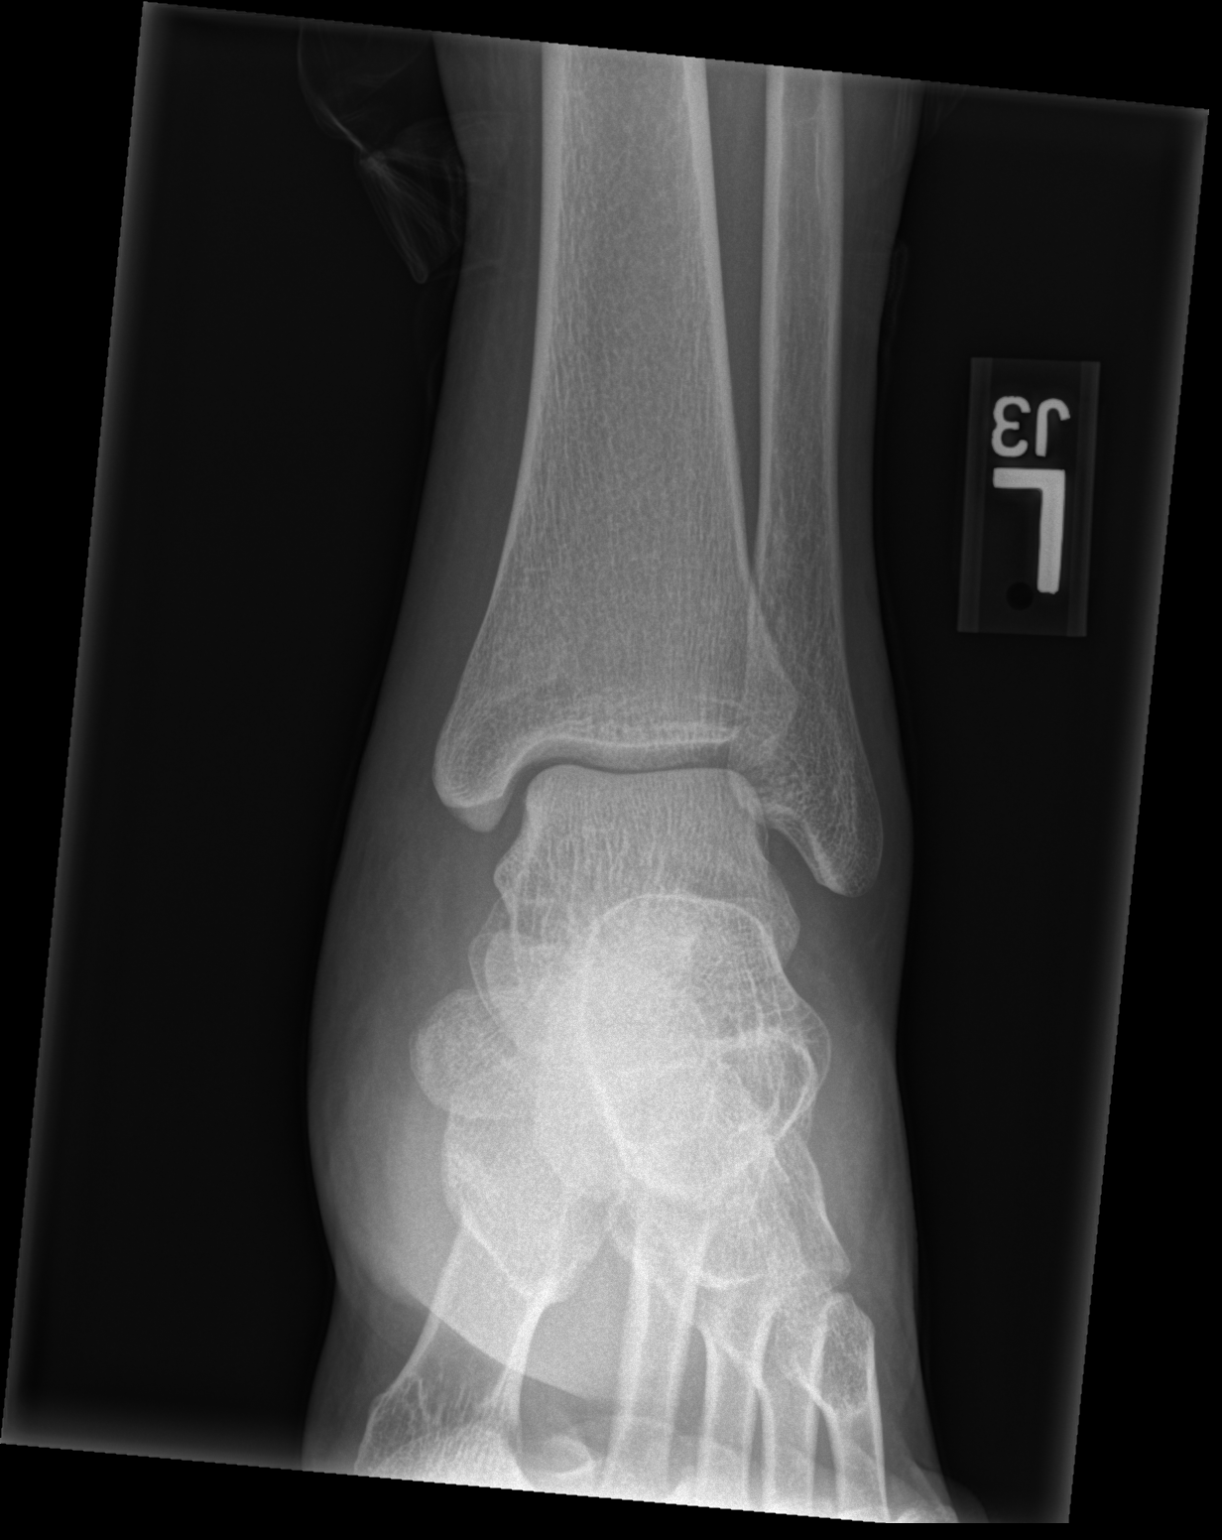

[x ankle obl left]
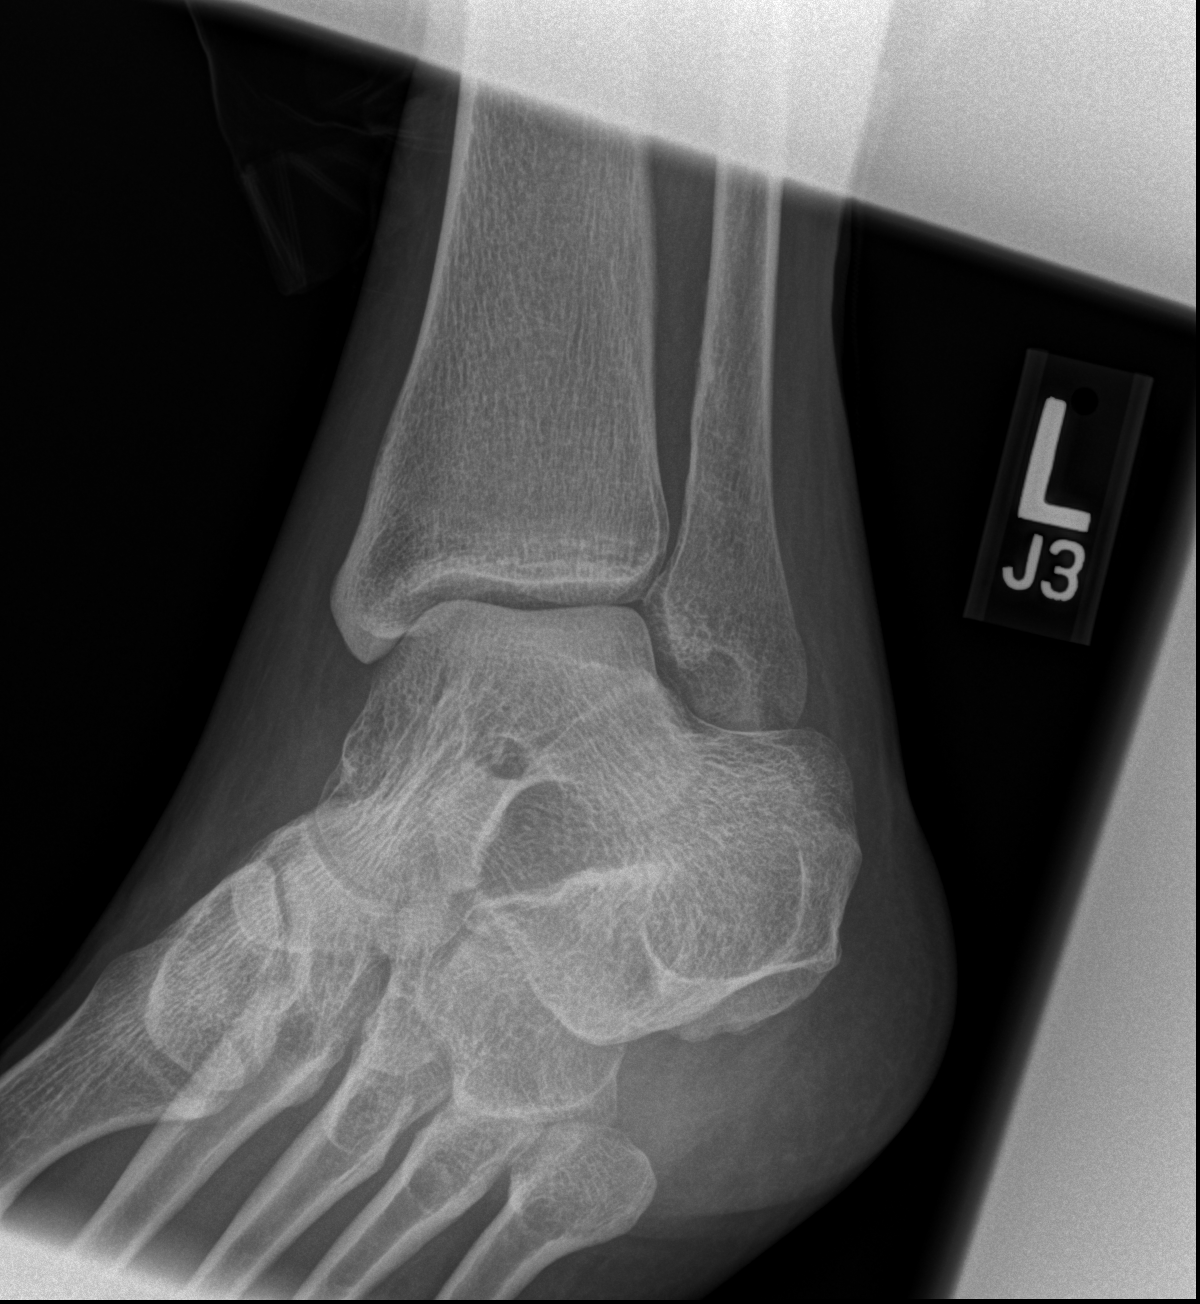

[x ankle lat left]
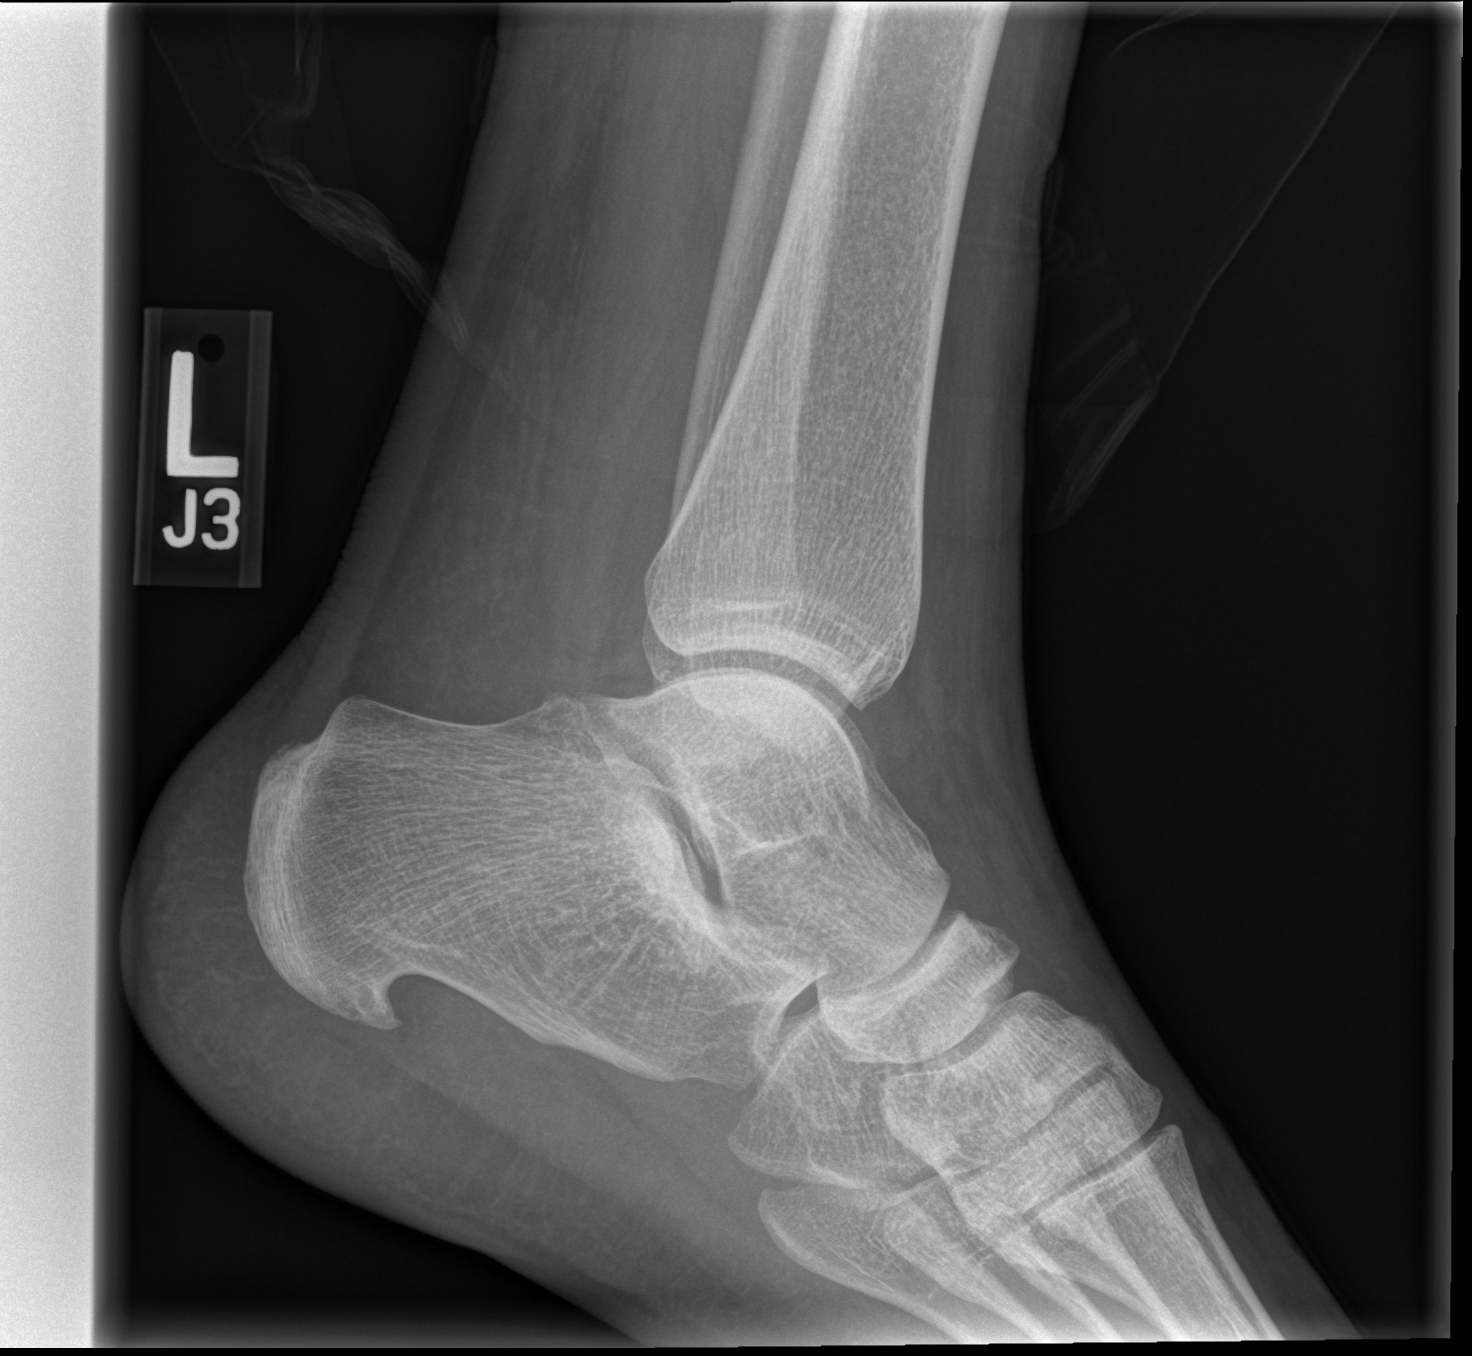

[3 of 3 positions shown; findings below may reference images not displayed]

FINDINGS: Three views of the left ankle submitted.  No acute
fracture or subluxation.  Plantar spur of the calcaneus is noted.
Ankle mortise is preserved.  Mild soft tissue swelling adjacent to
medial malleolus.
IMPRESSION: No acute fracture or subluxation.  Mild soft tissue swelling
medially.  Plantar spur of the calcaneus.

## 2014-05-09 ENCOUNTER — Emergency Department (INDEPENDENT_AMBULATORY_CARE_PROVIDER_SITE_OTHER)
Admission: EM | Admit: 2014-05-09 | Discharge: 2014-05-09 | Disposition: A | Discharge status: Home / Self Care | Payer: Self-pay | Source: Home / Self Care | Attending: Family Medicine | Admitting: Family Medicine

## 2014-05-09 ENCOUNTER — Encounter (HOSPITAL_COMMUNITY): Payer: Self-pay | Admitting: Emergency Medicine

## 2014-05-09 DIAGNOSIS — L089 Local infection of the skin and subcutaneous tissue, unspecified: Secondary | ICD-10-CM

## 2014-05-09 MED ORDER — MUPIROCIN CALCIUM 2 % NA OINT: TOPICAL_OINTMENT | NASAL | Status: DC

## 2014-05-09 NOTE — ED Notes (Signed)
C/o left 5th digit pain/swelling onset 1 week Denies inj/trauma, fevers, chills Alert, no signs of acute distress.

## 2014-05-09 NOTE — Discharge Instructions (Signed)
Fingertip Infection °When an infection is around the nail, it is called a paronychia. When it appears over the tip of the finger, it is called a felon. These infections are due to minor injuries or cracks in the skin. If they are not treated properly, they can lead to bone infection and permanent damage to the fingernail. °Incision and drainage is necessary if a pus pocket (an abscess) has formed. Antibiotics and pain medicine may also be needed. Keep your hand elevated for the next 2-3 days to reduce swelling and pain. If a pack was placed in the abscess, it should be removed in 1-2 days by your caregiver. Soak the finger in warm water for 20 minutes 4 times daily to help promote drainage. °Keep the hands as dry as possible. Wear protective gloves with cotton liners. See your caregiver for follow-up care as recommended.  °HOME CARE INSTRUCTIONS  °· Keep wound clean, dry and dressed as suggested by your caregiver. °· Soak in warm salt water for fifteen minutes, four times per day for bacterial infections. °· Your caregiver will prescribe an antibiotic if a bacterial infection is suspected. Take antibiotics as directed and finish the prescription, even if the problem appears to be improving before the medicine is gone. °· Only take over-the-counter or prescription medicines for pain, discomfort, or fever as directed by your caregiver. °SEEK IMMEDIATE MEDICAL CARE IF: °· There is redness, swelling, or increasing pain in the wound. °· Pus or any other unusual drainage is coming from the wound. °· An unexplained oral temperature above 102° F (38.9° C) develops. °· You notice a foul smell coming from the wound or dressing. °MAKE SURE YOU:  °· Understand these instructions. °· Monitor your condition. °· Contact your caregiver if you are getting worse or not improving. °Document Released: 04/30/2004 Document Revised: 06/15/2011 Document Reviewed: 04/26/2008 °[employer]® Patient Information ©2015 [employer]. This  information is not intended to replace advice given to you by your health care provider. Make sure you discuss any questions you have with your health care provider. ° °

## 2014-05-09 NOTE — ED Provider Notes (Signed)
CSN: [id number]     Arrival date & time 05/09/14  1634 History   First MD Initiated Contact with Patient 05/09/14 1738     Chief Complaint  Patient presents with  . Hand Pain   (Consider location/radiation/quality/duration/timing/severity/associated sxs/prior Treatment) HPI Comments: Patient reports several days of isolated swelling of distal portion of left distal 5th finger without known injury or previous episodes. 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 No changes in strength or sensation of affected area. Has been using ice and ibuprofen at home with little change. Recently removed her acrylic nails as she was concerned that these may be contributing to swelling.   Patient is a 34 y.o. female presenting with hand pain. The history is provided by the patient.  Hand Pain    Past Medical History  Diagnosis Date  . Allergy   . Anemia   . Recurrent boils   . Nearsightedness     wears glasses  . Chronic headache   . Arrhythmia   . WPW (Wolff-Parkinson-White syndrome)   . Obesity    Past Surgical History  Procedure Laterality Date  . Cystectomy      tonsils   Family History  Problem Relation Age of Onset  . Breast cancer Mother   . Pulmonary embolism Mother     died of PE  . Diabetes Paternal Grandmother   . Heart disease Neg Hx   . Stroke Neg Hx   . Colon cancer Mother   . Irritable bowel syndrome Mother    History  Substance Use Topics  . Smoking status: Former Smoker -- .5 years    Types: Cigarettes  . Smokeless tobacco: Never Used     Comment: smokes black and milds  . Alcohol Use: Yes     Comment: occasionally   OB History    Gravida Para Term Preterm AB TAB SAB Ectopic Multiple Living   0              Review of Systems  All other systems reviewed and are negative.   Allergies  Other; Penicillins; and Shrimp  Home Medications   Prior to Admission medications   Medication Sig Start Date End Date Taking? Authorizing Provider  ferrous sulfate  325 (65 FE) MG tablet Take 1 tablet (325 mg total) by mouth daily with breakfast. 08/16/13   Garald Balding, NP  metoprolol tartrate (LOPRESSOR) 25 MG tablet Take 1 tablet (25 mg total) by mouth 2 (two) times daily. 08/22/13   Evans Lance, MD  mupirocin nasal ointment (BACTROBAN) 2 % Apply to affected area BID x 7 days 05/09/14   Lutricia Feil, PA  PredniSONE 10 MG KIT 12 day dose pack po 09/22/13   Gregor Hams, MD   BP 119/67 mmHg  Pulse 89  Temp(Src) 97.9 F (36.6 C) (Oral)  Resp 20  SpO2 100%  LMP 04/30/2014 Physical Exam  Constitutional: She is oriented to person, place, and time. She appears well-developed and well-nourished.  HENT:  Head: Normocephalic and atraumatic.  Eyes: Conjunctivae are normal.  Cardiovascular: Normal rate.   Pulmonary/Chest: Effort normal.  Neurological: She is alert and oriented to person, place, and time.  Skin: Skin is warm and dry.  Small area of mild erythema and STS at dorsal surface of left 5th finger without induration or fluctuance. No paronychia. No felon   Psychiatric: She has a normal mood and affect. Her behavior is normal.  Nursing note and vitals reviewed.   ED  Course  Procedures (including critical care time) Labs Review Labs Reviewed - No data to display  Imaging Review No results found.   MDM   1. Skin infection   Warm compresses and Bactroban as directed with follow up if no improvement.     Lutricia Feil, PA 05/09/14 [address] [place] 05/09/14 [phone]

## 2014-06-16 ENCOUNTER — Encounter (HOSPITAL_COMMUNITY): Payer: Self-pay | Admitting: *Deleted

## 2014-06-16 DIAGNOSIS — Y9389 Activity, other specified: Secondary | ICD-10-CM | POA: Insufficient documentation

## 2014-06-16 DIAGNOSIS — Z8669 Personal history of other diseases of the nervous system and sense organs: Secondary | ICD-10-CM | POA: Insufficient documentation

## 2014-06-16 DIAGNOSIS — Z880 Allergy status to penicillin: Secondary | ICD-10-CM | POA: Insufficient documentation

## 2014-06-16 DIAGNOSIS — S93602A Unspecified sprain of left foot, initial encounter: Secondary | ICD-10-CM | POA: Insufficient documentation

## 2014-06-16 DIAGNOSIS — E669 Obesity, unspecified: Secondary | ICD-10-CM | POA: Insufficient documentation

## 2014-06-16 DIAGNOSIS — X58XXXA Exposure to other specified factors, initial encounter: Secondary | ICD-10-CM | POA: Insufficient documentation

## 2014-06-16 DIAGNOSIS — Y9289 Other specified places as the place of occurrence of the external cause: Secondary | ICD-10-CM | POA: Insufficient documentation

## 2014-06-16 DIAGNOSIS — Y998 Other external cause status: Secondary | ICD-10-CM | POA: Insufficient documentation

## 2014-06-16 DIAGNOSIS — Z8679 Personal history of other diseases of the circulatory system: Secondary | ICD-10-CM | POA: Insufficient documentation

## 2014-06-16 DIAGNOSIS — Z87891 Personal history of nicotine dependence: Secondary | ICD-10-CM | POA: Insufficient documentation

## 2014-06-16 DIAGNOSIS — Z862 Personal history of diseases of the blood and blood-forming organs and certain disorders involving the immune mechanism: Secondary | ICD-10-CM | POA: Insufficient documentation

## 2014-06-16 DIAGNOSIS — Z79899 Other long term (current) drug therapy: Secondary | ICD-10-CM | POA: Insufficient documentation

## 2014-06-16 DIAGNOSIS — L02412 Cutaneous abscess of left axilla: Secondary | ICD-10-CM | POA: Insufficient documentation

## 2014-06-16 NOTE — ED Notes (Signed)
The pt has a boil uncer her rt arm for one week.  She is also c/o  Some skin infectiion in her lt little finger for 2-3 weeks.  She has also had  Bi-lateral heel pain foir 5-6 years.   She feels tired and she  Wants her iron checked.  lmp feb.  She sometimes has a fluttering in her ches.  None niow

## 2014-06-17 ENCOUNTER — Emergency Department (HOSPITAL_COMMUNITY)
Admission: EM | Admit: 2014-06-17 | Discharge: 2014-06-17 | Disposition: A | Discharge status: Home / Self Care | Payer: Self-pay | Attending: Emergency Medicine | Admitting: Emergency Medicine

## 2014-06-17 DIAGNOSIS — L02412 Cutaneous abscess of left axilla: Secondary | ICD-10-CM

## 2014-06-17 DIAGNOSIS — S93602A Unspecified sprain of left foot, initial encounter: Secondary | ICD-10-CM

## 2014-06-17 MED ORDER — TRAMADOL HCL 50 MG PO TABS: 50.0000 mg | ORAL_TABLET | Freq: Four times a day (QID) | ORAL | Status: DC | PRN

## 2014-06-17 MED ORDER — IBUPROFEN 600 MG PO TABS: 600.0000 mg | ORAL_TABLET | Freq: Four times a day (QID) | ORAL | Status: DC | PRN

## 2014-06-17 NOTE — ED Provider Notes (Signed)
CSN: [id number]     Arrival date & time 06/16/14  2313 History  This chart was scribed for Amanda Rice, MD by Eustaquio Maize, ED Scribe. This patient was seen in room D35C/D35C and the patient's care was started at 3:46 AM.    Chief Complaint  Patient presents with  . multiple complaints    The history is provided by the patient. No language interpreter was used.     HPI Comments: Amanda Davenport is a 34 y.o. female who presents to the Emergency Department complaining of abscess to right underarm that began 1 week ago. Pt reports that it popped 1 week ago and has been draining a greenish yellow pus. She admits to increased pain and tenderness to the area. Pt also complains of left ankle pain that began 1 week ago. She denies any recent injury or trauma to the ankle that could have caused the pain. Pt has been taking Goodies Arthritis for the pain. She also complains of skin infection to 5th digit on left hand. States that she went to Urgent Care and they believe it was an infection. Pt denies fever, chills, or any other symptoms.   PCP - None Wound Care - None   Past Medical History  Diagnosis Date  . Allergy   . Anemia   . Recurrent boils   . Nearsightedness     wears glasses  . Chronic headache   . Arrhythmia   . WPW (Wolff-Parkinson-White syndrome)   . Obesity    Past Surgical History  Procedure Laterality Date  . Cystectomy      tonsils   Family History  Problem Relation Age of Onset  . Breast cancer Mother   . Pulmonary embolism Mother     died of PE  . Diabetes Paternal Grandmother   . Heart disease Neg Hx   . Stroke Neg Hx   . Colon cancer Mother   . Irritable bowel syndrome Mother    History  Substance Use Topics  . Smoking status: Former Smoker -- .5 years    Types: Cigarettes  . Smokeless tobacco: Never Used     Comment: smokes black and milds  . Alcohol Use: Yes     Comment: occasionally   OB History    Gravida Para Term Preterm AB TAB SAB Ectopic  Multiple Living   0              Review of Systems  Constitutional: Negative for fever and chills.  Musculoskeletal: Positive for arthralgias (Left ankle pain. ).  Skin: Positive for rash.       Abscess to right underarm. Skin infection to 5th digit on left hand.   Neurological: Negative for dizziness, weakness, light-headedness, numbness and headaches.  All other systems reviewed and are negative.     Allergies  Other; Penicillins; and Shrimp  Home Medications   Prior to Admission medications   Medication Sig Start Date End Date Taking? Authorizing Provider  ferrous sulfate 325 (65 FE) MG tablet Take 1 tablet (325 mg total) by mouth daily with breakfast. 08/16/13   Junius Creamer, NP  ibuprofen (ADVIL,MOTRIN) 600 MG tablet Take 1 tablet (600 mg total) by mouth every 6 (six) hours as needed. 06/17/14   Amanda Rice, MD  metoprolol tartrate (LOPRESSOR) 25 MG tablet Take 1 tablet (25 mg total) by mouth 2 (two) times daily. 08/22/13   Evans Lance, MD  mupirocin nasal ointment (BACTROBAN) 2 % Apply to affected area BID x 7 days  05/09/14   Audelia Hives Presson, PA  PredniSONE 10 MG KIT 12 day dose pack po 09/22/13   Gregor Hams, MD  traMADol (ULTRAM) 50 MG tablet Take 1 tablet (50 mg total) by mouth every 6 (six) hours as needed. 06/17/14   Amanda Rice, MD   Triage Vitals: BP 96/61 mmHg  Pulse 100  Temp(Src) 99 F (37.2 C)  Resp 18  Ht 5' 9"  (1.753 m)  Wt 268 lb (121.564 kg)  BMI 39.56 kg/m2  SpO2 98%  LMP 05/25/2014   Physical Exam  Constitutional: She is oriented to person, place, and time. She appears well-developed and well-nourished. No distress.  HENT:  Head: Normocephalic and atraumatic.  Mouth/Throat: Oropharynx is clear and moist.  Eyes: EOM are normal. Pupils are equal, round, and reactive to light.  Neck: Normal range of motion. Neck supple.  Cardiovascular: Normal rate and regular rhythm.   Pulmonary/Chest: Effort normal and breath sounds normal. No  respiratory distress. She has no wheezes. She has no rales.  Abdominal: Soft. Bowel sounds are normal. She exhibits no distension and no mass. There is no tenderness. There is no rebound and no guarding.  Musculoskeletal: Normal range of motion. She exhibits no edema or tenderness.  Patient with mild diffuse tenderness to the bilateral heels. There is no obvious trauma. No calf swelling or tenderness. Patient does have a large firm mass in her right axilla. Nontender to palpation. Patient has an open draining abscess in the left axilla. There is no obvious cellulitis.  Neurological: She is alert and oriented to person, place, and time.  Skin: Skin is warm and dry. No rash noted. No erythema.  Psychiatric: She has a normal mood and affect. Her behavior is normal.  Nursing note and vitals reviewed.   ED Course  Procedures (including critical care time)  DIAGNOSTIC STUDIES: Oxygen Saturation is 98% on RA, normal by my interpretation.    COORDINATION OF CARE: 3:47 AM-Discussed treatment plan with pt at bedside and pt agreed to plan.   Labs Review Labs Reviewed - No data to display  Imaging Review No results found.   EKG Interpretation None      MDM   Final diagnoses:  Foot sprain, left, initial encounter  Abscess of axilla, left   I personally performed the services described in this documentation, which was scribed in my presence. The recorded information has been reviewed and is accurate.  She is very well-appearing. She presents with multiple complaints. She is advised to follow-up with her primary physician. Return precautions have been given.    Amanda Rice, MD 06/19/14 [phone]

## 2014-06-17 NOTE — Discharge Instructions (Signed)
Abscess Care After An abscess (also called a boil or furuncle) is an infected area that contains a collection of pus. Signs and symptoms of an abscess include pain, tenderness, redness, or hardness, or you may feel a moveable soft area under your skin. An abscess can occur anywhere in the body. The infection may spread to surrounding tissues causing cellulitis. A cut (incision) by the surgeon was made over your abscess and the pus was drained out. Gauze may have been packed into the space to provide a drain that will allow the cavity to heal from the inside outwards. The boil may be painful for 5 to 7 days. Most people with a boil do not have high fevers. Your abscess, if seen early, may not have localized, and may not have been lanced. If not, another appointment may be required for this if it does not get better on its own or with medications. HOME CARE INSTRUCTIONS   Only take over-the-counter or prescription medicines for pain, discomfort, or fever as directed by your caregiver.  When you bathe, soak and then remove gauze or iodoform packs at least daily or as directed by your caregiver. You may then wash the wound gently with mild soapy water. Repack with gauze or do as your caregiver directs. SEEK IMMEDIATE MEDICAL CARE IF:   You develop increased pain, swelling, redness, drainage, or bleeding in the wound site.  You develop signs of generalized infection including muscle aches, chills, fever, or a general ill feeling.  An oral temperature above 102 F (38.9 C) develops, not controlled by medication. See your caregiver for a recheck if you develop any of the symptoms described above. If medications (antibiotics) were prescribed, take them as directed. Document Released: 10/09/2004 Document Revised: 06/15/2011 Document Reviewed: 06/06/2007 [facility] Patient Information 2015 [place], [place]. This information is not intended to replace advice given to you by your health care provider. Make sure  you discuss any questions you have with your health care provider.  Foot Sprain The muscles and cord like structures which attach muscle to bone (tendons) that surround the feet are made up of units. A foot sprain can occur at the weakest spot in any of these units. This condition is most often caused by injury to or overuse of the foot, as from playing contact sports, or aggravating a previous injury, or from poor conditioning, or obesity. SYMPTOMS  Pain with movement of the foot.  Tenderness and swelling at the injury site.  Loss of strength is present in moderate or severe sprains. THE THREE GRADES OR SEVERITY OF FOOT SPRAIN ARE:  Mild (Grade I): Slightly pulled muscle without tearing of muscle or tendon fibers or loss of strength.  Moderate (Grade II): Tearing of fibers in a muscle, tendon, or at the attachment to bone, with small decrease in strength.  Severe (Grade III): Rupture of the muscle-tendon-bone attachment, with separation of fibers. Severe sprain requires surgical repair. Often repeating (chronic) sprains are caused by overuse. Sudden (acute) sprains are caused by direct injury or over-use. DIAGNOSIS  Diagnosis of this condition is usually by your own observation. If problems continue, a caregiver may be required for further evaluation and treatment. X-rays may be required to make sure there are not breaks in the bones (fractures) present. Continued problems may require physical therapy for treatment. PREVENTION  Use strength and conditioning exercises appropriate for your sport.  Warm up properly prior to working out.  Use athletic shoes that are made for the sport you are participating  in.  Allow adequate time for healing. Early return to activities makes repeat injury more likely, and can lead to an unstable arthritic foot that can result in prolonged disability. Mild sprains generally heal in 3 to 10 days, with moderate and severe sprains taking 2 to 10 weeks. Your  caregiver can help you determine the proper time required for healing. HOME CARE INSTRUCTIONS   Apply ice to the injury for 15-20 minutes, 03-04 times per day. Put the ice in a plastic bag and place a towel between the bag of ice and your skin.  An elastic wrap (like an Ace bandage) may be used to keep swelling down.  Keep foot above the level of the heart, or at least raised on a footstool, when swelling and pain are present.  Try to avoid use other than gentle range of motion while the foot is painful. Do not resume use until instructed by your caregiver. Then begin use gradually, not increasing use to the point of pain. If pain does develop, decrease use and continue the above measures, gradually increasing activities that do not cause discomfort, until you gradually achieve normal use.  Use crutches if and as instructed, and for the length of time instructed.  Keep injured foot and ankle wrapped between treatments.  Massage foot and ankle for comfort and to keep swelling down. Massage from the toes up towards the knee.  Only take over-the-counter or prescription medicines for pain, discomfort, or fever as directed by your caregiver. SEEK IMMEDIATE MEDICAL CARE IF:   Your pain and swelling increase, or pain is not controlled with medications.  You have loss of feeling in your foot or your foot turns cold or blue.  You develop new, unexplained symptoms, or an increase of the symptoms that brought you to your caregiver. MAKE SURE YOU:   Understand these instructions.  Will watch your condition.  Will get help right away if you are not doing well or get worse. Document Released: 09/12/2001 Document Revised: 06/15/2011 Document Reviewed: 11/10/2007 [facility] Patient Information 2015 [place], [place]. This information is not intended to replace advice given to you by your health care provider. Make sure you discuss any questions you have with your health care provider.

## 2014-06-17 NOTE — ED Notes (Signed)
MD at bedside. 

## 2014-06-17 NOTE — ED Notes (Signed)
Patient states she understands what to follow up with her PCP about. Teach back method used for pain management and a demonstration on how to dress the wounds was preform.

## 2014-06-22 ENCOUNTER — Other Ambulatory Visit (HOSPITAL_COMMUNITY)
Admission: RE | Admit: 2014-06-22 | Discharge: 2014-06-22 | Disposition: A | Discharge status: Home / Self Care | Payer: Self-pay | Source: Ambulatory Visit | Attending: Emergency Medicine | Admitting: Emergency Medicine

## 2014-06-22 ENCOUNTER — Encounter (HOSPITAL_COMMUNITY): Payer: Self-pay | Admitting: *Deleted

## 2014-06-22 ENCOUNTER — Emergency Department (INDEPENDENT_AMBULATORY_CARE_PROVIDER_SITE_OTHER)
Admission: EM | Admit: 2014-06-22 | Discharge: 2014-06-22 | Disposition: A | Discharge status: Home / Self Care | Payer: Self-pay | Source: Home / Self Care | Attending: Emergency Medicine | Admitting: Emergency Medicine

## 2014-06-22 DIAGNOSIS — N8110 Cystocele, unspecified: Secondary | ICD-10-CM

## 2014-06-22 DIAGNOSIS — N760 Acute vaginitis: Secondary | ICD-10-CM | POA: Insufficient documentation

## 2014-06-22 DIAGNOSIS — Z113 Encounter for screening for infections with a predominantly sexual mode of transmission: Secondary | ICD-10-CM | POA: Insufficient documentation

## 2014-06-22 DIAGNOSIS — L732 Hidradenitis suppurativa: Secondary | ICD-10-CM

## 2014-06-22 LAB — POCT URINALYSIS DIP (DEVICE)
BILIRUBIN URINE: NEGATIVE
Glucose, UA: NEGATIVE mg/dL
Ketones, ur: NEGATIVE mg/dL
NITRITE: NEGATIVE
PH: 5.5 (ref 5.0–8.0)
Protein, ur: NEGATIVE mg/dL
Specific Gravity, Urine: 1.025 (ref 1.005–1.030)
Urobilinogen, UA: 0.2 mg/dL (ref 0.0–1.0)

## 2014-06-22 LAB — POCT PREGNANCY, URINE: Preg Test, Ur: NEGATIVE

## 2014-06-22 MED ORDER — FLUCONAZOLE 150 MG PO TABS: 150.0000 mg | ORAL_TABLET | Freq: Every day | ORAL | Status: DC

## 2014-06-22 MED ORDER — DOXYCYCLINE HYCLATE 100 MG PO CAPS: 100.0000 mg | ORAL_CAPSULE | Freq: Two times a day (BID) | ORAL | Status: DC

## 2014-06-22 MED ORDER — TRAMADOL HCL 50 MG PO TABS: 50.0000 mg | ORAL_TABLET | Freq: Four times a day (QID) | ORAL | Status: DC | PRN

## 2014-06-22 NOTE — ED Provider Notes (Signed)
CSN: [id number]     Arrival date & time 06/22/14  4193 History   First MD Initiated Contact with Patient 06/22/14 [phone]     Chief Complaint  Patient presents with  . Urinary Tract Infection   (Consider location/radiation/quality/duration/timing/severity/associated sxs/prior Treatment) HPI  She is a 34 year old woman here for several complaints.  She is concerned about a right axillary boil. She states it has been present for 2 weeks. It ruptured spontaneously. She was seen at the ER on Sunday for this and was told it looked good at that point. Today, she states the area that ruptured is getting larger. She has been washing it with soap and water then applying antibiotic ointment. No fevers or chills. No redness or streaking around the wound. She does state the skin at the edges seems to pull away easily.  She states it is getting larger.  She is also concerned about a vaginal discharge. She is not sure if it is vaginal or from her hidradenitis. Her period just started today. No concern for STDs.  Lastly, she is concerned for possible UTI. She reports urinary frequency and urgency. She states she has difficulty making it to the bathroom in time. She also reports some leakage of urine with laughing. No dysuria or flank pain.  She does report constipation with straining. She does not have any children.  Past Medical History  Diagnosis Date  . Allergy   . Anemia   . Recurrent boils   . Nearsightedness     wears glasses  . Chronic headache   . Arrhythmia   . WPW (Wolff-Parkinson-White syndrome)   . Obesity    Past Surgical History  Procedure Laterality Date  . Cystectomy      tonsils   Family History  Problem Relation Age of Onset  . Breast cancer Mother   . Pulmonary embolism Mother     died of PE  . Diabetes Paternal Grandmother   . Heart disease Neg Hx   . Stroke Neg Hx   . Colon cancer Mother   . Irritable bowel syndrome Mother    History  Substance Use Topics  . Smoking  status: Former Smoker -- .5 years    Types: Cigarettes  . Smokeless tobacco: Never Used     Comment: smokes black and milds  . Alcohol Use: Yes     Comment: occasionally   OB History    Gravida Para Term Preterm AB TAB SAB Ectopic Multiple Living   0              Review of Systems  Constitutional: Negative for fever and chills.  Gastrointestinal: Positive for constipation and blood in stool. Negative for vomiting, abdominal pain, diarrhea and rectal pain.  Genitourinary: Positive for urgency, frequency and vaginal discharge. Negative for dysuria, hematuria and flank pain.  Skin: Positive for wound.    Allergies  Other; Penicillins; and Shrimp  Home Medications   Prior to Admission medications   Medication Sig Start Date End Date Taking? Authorizing Provider  doxycycline (VIBRAMYCIN) 100 MG capsule Take 1 capsule (100 mg total) by mouth 2 (two) times daily. 06/22/14   Melony Overly, MD  ferrous sulfate 325 (65 FE) MG tablet Take 1 tablet (325 mg total) by mouth daily with breakfast. 08/16/13   Junius Creamer, NP  fluconazole (DIFLUCAN) 150 MG tablet Take 1 tablet (150 mg total) by mouth daily. Repeat in 72 hours if needed 06/22/14   Melony Overly, MD  ibuprofen (ADVIL,MOTRIN) 600  MG tablet Take 1 tablet (600 mg total) by mouth every 6 (six) hours as needed. 06/17/14   [name], MD  metoprolol tartrate (LOPRESSOR) 25 MG tablet Take 1 tablet (25 mg total) by mouth 2 (two) times daily. 08/22/13   Evans Lance, MD  mupirocin nasal ointment (BACTROBAN) 2 % Apply to affected area BID x 7 days 05/09/14   Lutricia Feil, PA  PredniSONE 10 MG KIT 12 day dose pack po 09/22/13   Gregor Hams, MD  traMADol (ULTRAM) 50 MG tablet Take 1 tablet (50 mg total) by mouth every 6 (six) hours as needed. 06/22/14   Melony Overly, MD   BP 109/62 mmHg  Pulse 90  Temp(Src) 99.2 F (37.3 C) (Oral)  Resp 16  SpO2 100%  LMP 05/25/2014 Physical Exam  Constitutional: She is oriented to person, place,  and time. She appears well-developed and well-nourished. No distress.  Neck: Neck supple.  Cardiovascular: Normal rate, regular rhythm and normal heart sounds.   No murmur heard. Pulmonary/Chest: Effort normal and breath sounds normal. No respiratory distress. She has no wheezes. She has no rales.  Genitourinary:    There is no rash on the right labia. There is no rash on the left labia. There is bleeding (on period) in the vagina. No foreign body around the vagina. No vaginal discharge found.  Bladder prolapse with valsalva.  Neurological: She is alert and oriented to person, place, and time.  Skin: Skin is warm and dry.  2cm shallow ulceration under right arm.  It is clean without drainage.  No surrounding erythema.  Does have some peripheral small ulcerations.  Skin at edges is loose.    ED Course  Procedures (including critical care time) Labs Review Labs Reviewed  POCT URINALYSIS DIP (DEVICE) - Abnormal; Notable for the following:    Hgb urine dipstick LARGE (*)    Leukocytes, UA TRACE (*)    All other components within normal limits  POCT PREGNANCY, URINE  CERVICOVAGINAL ANCILLARY ONLY    Imaging Review No results found.   MDM   1. Hidradenitis   2. Bladder prolapse, female, acquired    Will treat with a 10 day course of doxycycline. Discussed wound care as in after visit summary. I suspect she will likely need to see the wound clinic at some point.  Her urinary symptoms are likely secondary to bladder prolapse from chronic constipation. Discussed taking a stool softener. Recommended Kegel exercises.  She spoke with our financial counselor today.  Follow-up as needed.    Melony Overly, MD 06/22/14 [phone]

## 2014-06-22 NOTE — Discharge Instructions (Signed)
Wash the wound twice a day with soap and water. Apply Vaseline and cover. Please start taking iron once a day. Take doxycycline 1 pill twice a day for the next 10 days. Follow-up if no improvement in wound healing.  Your urinary symptoms are likely due to something called bladder prolapse. Please do Keegle exercises. Get an over-the-counter stool softener and take daily.  Follow-up as needed.

## 2014-06-22 NOTE — ED Notes (Signed)
Pt    Reports       Symptoms  Of      Urinary  Frequency            As  Well as    Sore  Under  r    Armpit             She  Also   Reports  A  Vaginal  Discharge  As  Well

## 2014-06-25 LAB — CERVICOVAGINAL ANCILLARY ONLY
CHLAMYDIA, DNA PROBE: NEGATIVE
NEISSERIA GONORRHEA: NEGATIVE
Wet Prep (BD Affirm): NEGATIVE
Wet Prep (BD Affirm): NEGATIVE
Wet Prep (BD Affirm): NEGATIVE

## 2014-07-05 ENCOUNTER — Emergency Department (INDEPENDENT_AMBULATORY_CARE_PROVIDER_SITE_OTHER)
Admission: EM | Admit: 2014-07-05 | Discharge: 2014-07-05 | Disposition: A | Discharge status: Home / Self Care | Payer: Self-pay | Source: Home / Self Care | Attending: Emergency Medicine | Admitting: Emergency Medicine

## 2014-07-05 ENCOUNTER — Encounter (HOSPITAL_COMMUNITY): Payer: Self-pay | Admitting: Emergency Medicine

## 2014-07-05 ENCOUNTER — Ambulatory Visit (HOSPITAL_COMMUNITY): Payer: Self-pay | Attending: Family Medicine

## 2014-07-05 DIAGNOSIS — M7989 Other specified soft tissue disorders: Secondary | ICD-10-CM | POA: Insufficient documentation

## 2014-07-05 DIAGNOSIS — M79644 Pain in right finger(s): Secondary | ICD-10-CM | POA: Insufficient documentation

## 2014-07-05 DIAGNOSIS — L03011 Cellulitis of right finger: Secondary | ICD-10-CM

## 2014-07-05 MED ORDER — CLINDAMYCIN HCL 300 MG PO CAPS: 300.0000 mg | ORAL_CAPSULE | Freq: Three times a day (TID) | ORAL | Status: DC

## 2014-07-05 MED ORDER — INDOMETHACIN 50 MG PO CAPS: 50.0000 mg | ORAL_CAPSULE | Freq: Three times a day (TID) | ORAL | Status: DC | PRN

## 2014-07-05 NOTE — ED Notes (Signed)
Amanda Davenport, emt applied splint.

## 2014-07-05 NOTE — Discharge Instructions (Signed)

## 2014-07-05 NOTE — ED Notes (Signed)
Patient has complaints of pain in right ring finger, joints.  Reports random joints swell, have pain and stiffness in joint.  Reports back to back episodes for a year.  [facility]

## 2014-07-05 NOTE — ED Notes (Signed)
Patient sent to [facility] for xray, ucc xray down

## 2014-07-05 NOTE — ED Notes (Signed)
Patient discharge pending dressing/splint application

## 2014-07-05 NOTE — ED Provider Notes (Signed)
CSN: [id number]     Arrival date & time 07/05/14  4098 History   First MD Initiated Contact with Patient 07/05/14 [phone]     Chief Complaint  Patient presents with  . Joint Pain   (Consider location/radiation/quality/duration/timing/severity/associated sxs/prior Treatment) HPI Comments: 34 year old female is complaining of a five-day history of right ring finger pain and swelling. She denies any known trauma. Denies punctures or other injury to the skin. She states that she is unable to completely flex or extend the digit. The pain is primarily to the PIP and proximal.   Past Medical History  Diagnosis Date  . Allergy   . Anemia   . Recurrent boils   . Nearsightedness     wears glasses  . Chronic headache   . Arrhythmia   . WPW (Wolff-Parkinson-White syndrome)   . Obesity    Past Surgical History  Procedure Laterality Date  . Cystectomy      tonsils   Family History  Problem Relation Age of Onset  . Breast cancer Mother   . Pulmonary embolism Mother     died of PE  . Diabetes Paternal Grandmother   . Heart disease Neg Hx   . Stroke Neg Hx   . Colon cancer Mother   . Irritable bowel syndrome Mother    History  Substance Use Topics  . Smoking status: Former Smoker -- .5 years    Types: Cigarettes  . Smokeless tobacco: Never Used     Comment: smokes black and milds  . Alcohol Use: Yes     Comment: occasionally   OB History    Gravida Para Term Preterm AB TAB SAB Ectopic Multiple Living   0              Review of Systems  Musculoskeletal:       As per history of present illness No other joints involved.  All other systems reviewed and are negative.   Allergies  Other; Penicillins; and Shrimp  Home Medications   Prior to Admission medications   Medication Sig Start Date End Date Taking? Authorizing Provider  clindamycin (CLEOCIN) 300 MG capsule Take 1 capsule (300 mg total) by mouth 3 (three) times daily. 07/05/14   Janne Napoleon, NP  doxycycline (VIBRAMYCIN)  100 MG capsule Take 1 capsule (100 mg total) by mouth 2 (two) times daily. 06/22/14   Melony Overly, MD  ferrous sulfate 325 (65 FE) MG tablet Take 1 tablet (325 mg total) by mouth daily with breakfast. 08/16/13   Junius Creamer, NP  fluconazole (DIFLUCAN) 150 MG tablet Take 1 tablet (150 mg total) by mouth daily. Repeat in 72 hours if needed 06/22/14   Melony Overly, MD  ibuprofen (ADVIL,MOTRIN) 600 MG tablet Take 1 tablet (600 mg total) by mouth every 6 (six) hours as needed. 06/17/14   [name], MD  indomethacin (INDOCIN) 50 MG capsule Take 1 capsule (50 mg total) by mouth 3 (three) times daily as needed. Take with food 07/05/14   Janne Napoleon, NP  metoprolol tartrate (LOPRESSOR) 25 MG tablet Take 1 tablet (25 mg total) by mouth 2 (two) times daily. 08/22/13   Evans Lance, MD  mupirocin nasal ointment (BACTROBAN) 2 % Apply to affected area BID x 7 days 05/09/14   Lutricia Feil, PA  traMADol (ULTRAM) 50 MG tablet Take 1 tablet (50 mg total) by mouth every 6 (six) hours as needed. 06/22/14   Melony Overly, MD   BP 101/65 mmHg  Pulse 88  Temp(Src) 98.1 F (36.7 C) (Oral)  Resp 16  SpO2 97%  LMP 06/25/2014 Physical Exam  Constitutional: She is oriented to person, place, and time. She appears well-developed and well-nourished. No distress.  Neck: Normal range of motion. Neck supple.  Cardiovascular: Normal rate.   Pulmonary/Chest: Effort normal. No respiratory distress.  Musculoskeletal: She exhibits edema and tenderness.  Swelling to the right ring finger primarily surrounding the PIP. Tenderness of the middle and proximal phalanx. Limited range of motion in regards to flexion and extension of the PIP and DIP. Flexion of the DIP produces pain proximally. There is light pink area to the medial aspect of the PIP and proximal phalanx. No apparent skin injury or break in skin integrity. No lesions are seen.  Neurological: She is alert and oriented to person, place, and time.  Skin: Skin is warm  and dry.  Nursing note and vitals reviewed.   ED Course  Procedures (including critical care time) Labs Review Labs Reviewed - No data to display  Imaging Review Dg Finger Ring Right  07/05/2014   CLINICAL DATA:  34 year old female with pain redness and swelling of the right fourth finger for 1 week with no known injury. Initial encounter.  EXAM: RIGHT RING FINGER 2+V  COMPARISON:  Right hand series 3 06/2007.  FINDINGS: Bone mineralization remains normal. Joint spaces and alignment are within normal limits. Diffuse soft tissue swelling, maximal at the PIP and proximal phalanx level. No subcutaneous gas. No radiopaque foreign body identified. No acute osseous abnormality identified.  IMPRESSION: Soft tissue swelling with no acute osseous abnormality identified.   Electronically Signed   By: Genevie Ann M.D.   On: 07/05/2014 10:48     MDM   1. Cellulitis of finger of right hand    Clindamycin as directed Splint finger in position of function Indomethacin 50 mg 3 times a day with food when necessary Colon cancer urgent today for a follow-up appointment in 2 days. If there is any worsening new symptoms or problems such as increased redness swelling, pain, fevers or signs spreading into the hand or other fingers seek medical attention immediately.    Janne Napoleon, NP 07/05/14 1057

## 2014-08-10 ENCOUNTER — Encounter (HOSPITAL_COMMUNITY): Payer: Self-pay | Admitting: Emergency Medicine

## 2014-08-10 ENCOUNTER — Emergency Department (INDEPENDENT_AMBULATORY_CARE_PROVIDER_SITE_OTHER)
Admission: EM | Admit: 2014-08-10 | Discharge: 2014-08-10 | Disposition: A | Discharge status: Home / Self Care | Payer: Self-pay | Source: Home / Self Care | Attending: Family Medicine | Admitting: Family Medicine

## 2014-08-10 DIAGNOSIS — L089 Local infection of the skin and subcutaneous tissue, unspecified: Secondary | ICD-10-CM

## 2014-08-10 MED ORDER — LIDOCAINE HCL 2 % IJ SOLN
INTRAMUSCULAR | Status: AC
  Filled 2014-08-10: qty 20

## 2014-08-10 MED ORDER — FLUCONAZOLE 150 MG PO TABS: ORAL_TABLET | ORAL | Status: DC

## 2014-08-10 MED ORDER — CLINDAMYCIN HCL 300 MG PO CAPS: 300.0000 mg | ORAL_CAPSULE | Freq: Three times a day (TID) | ORAL | Status: DC

## 2014-08-10 NOTE — ED Provider Notes (Signed)
CSN: [id number]     Arrival date & time 08/10/14  1400 History   First MD Initiated Contact with Patient 08/10/14 1605     Chief Complaint  Patient presents with  . Hand Pain   (Consider location/radiation/quality/duration/timing/severity/associated sxs/prior Treatment) HPI Comments: 34 year old female was seen in the urgent care on 07/05/2014 for what appeared to be in infection of the right ring finger. She had mild erythema and swelling of the finger particularly the PIP and inability to fully extend at the PIP and DIP joints. She was treated with Indocin and clindamycin. She was advised to follow-up with the hand surgeon in 2 days. She states that she did not want to follow-up with her hand surgeon because of the possibility of having to have surgery in that she could not afford it. Several days or a week or more later she called the hand surgeon for an appointment and was told by the patient that she would need money upfront amounting to approximately $250. I called Dr. Charlyn Minerva office in the receptionist and the PA noted that a note was placed in her chart that she would not be seen there and had to follow back up at the urgent care. There is no physician there this weekend for this patient to see. Dr. Lenon Curt was on call. The provider consulted with Dr. Lenon Curt and he agreed to come to the urgent care to evaluate the finger and perform an I&D if necessary.   Past Medical History  Diagnosis Date  . Allergy   . Anemia   . Recurrent boils   . Nearsightedness     wears glasses  . Chronic headache   . Arrhythmia   . WPW (Wolff-Parkinson-White syndrome)   . Obesity    Past Surgical History  Procedure Laterality Date  . Cystectomy      tonsils   Family History  Problem Relation Age of Onset  . Breast cancer Mother   . Pulmonary embolism Mother     died of PE  . Diabetes Paternal Grandmother   . Heart disease Neg Hx   . Stroke Neg Hx   . Colon cancer Mother   . Irritable bowel  syndrome Mother    History  Substance Use Topics  . Smoking status: Former Smoker -- .5 years    Types: Cigarettes  . Smokeless tobacco: Never Used     Comment: smokes black and milds  . Alcohol Use: Yes     Comment: occasionally   OB History    Gravida Para Term Preterm AB TAB SAB Ectopic Multiple Living   0              Review of Systems  Constitutional: Negative for fever and activity change.  HENT: Negative.   Respiratory: Negative.   Cardiovascular: Negative.   Gastrointestinal: Negative.   Genitourinary: Negative.   Musculoskeletal: Positive for joint swelling and arthralgias.  Skin: Negative for rash.  Neurological: Negative.     Allergies  Other; Penicillins; and Shrimp  Home Medications   Prior to Admission medications   Medication Sig Start Date End Date Taking? Authorizing Provider  clindamycin (CLEOCIN) 300 MG capsule Take 1 capsule (300 mg total) by mouth 3 (three) times daily. 08/10/14   Janne Napoleon, NP  ferrous sulfate 325 (65 FE) MG tablet Take 1 tablet (325 mg total) by mouth daily with breakfast. 08/16/13   Junius Creamer, NP  fluconazole (DIFLUCAN) 150 MG tablet Take 1 tablet for yeast infection 08/10/14   Shanon Brow  Cristal Qadir, NP  ibuprofen (ADVIL,MOTRIN) 600 MG tablet Take 1 tablet (600 mg total) by mouth every 6 (six) hours as needed. 06/17/14   [name], MD  indomethacin (INDOCIN) 50 MG capsule Take 1 capsule (50 mg total) by mouth 3 (three) times daily as needed. Take with food 07/05/14   Janne Napoleon, NP  metoprolol tartrate (LOPRESSOR) 25 MG tablet Take 1 tablet (25 mg total) by mouth 2 (two) times daily. 08/22/13   Evans Lance, MD  mupirocin nasal ointment (BACTROBAN) 2 % Apply to affected area BID x 7 days 05/09/14   Lutricia Feil, PA  traMADol (ULTRAM) 50 MG tablet Take 1 tablet (50 mg total) by mouth every 6 (six) hours as needed. 06/22/14   Melony Overly, MD   BP 103/69 mmHg  Pulse 96  Temp(Src) 99.1 F (37.3 C) (Oral)  Resp 18  SpO2 99%  LMP  07/21/2014 Physical Exam  Constitutional: She appears well-developed and well-nourished. No distress.  Neck: Normal range of motion. Neck supple.  Cardiovascular: Normal rate and regular rhythm.   Pulmonary/Chest: Effort normal. No respiratory distress.  Nursing note and vitals reviewed.   ED Course  Procedures (including critical care time) Labs Review Labs Reviewed - No data to display  Imaging Review No results found.   MDM   1. Finger infection    Patient may have osteomyelitis or other ongoing infection within the right PIP and proximal phalanx. This information has been given to the patient and her significant other. After Dr. Lenon Curt agreed to come and see the patient and most likely I&D the finger the patient agreed. Dr. Lenon Curt did ask me to anesthetize the digit and prep the areas for the procedure. As I started the patient stated that she had to get up to go to the bathroom. I waited for approximately 7 minutes and she did not return. No into the bathroom to knock on the door she was not there and I was told she left to go to the lobby. During this time Dr. Lenon Curt called me back and asked about how the procedure was going and I told them the above. I saw another patient and he went back to see the patient and at that time the patient decided she did not want to have surgery or procedures on her finger. She was advised that she may have an infection that may not go away, will probably lose function of the finger and perhaps have to have the finger amputated if not properly treated. The infection may also spread. She states she understood this but did not want the procedure done. She was told that she needed to sign an AMA and [place], [place] was ordered to have her sign the AMA. She was given a prescription for clindamycin 300 mg 3 times a day for 10 days. This was at her request as an alternative.  Janne Napoleon, NP 08/10/14 [phone]

## 2014-08-10 NOTE — ED Notes (Signed)
C/o persistent right 4th finger pain onset 1 month; seen here on 3/31; given indocin and ibup 600 mg  Sx today include swelling at PIP joint and pain Alert, no signs of acute distress.

## 2014-09-12 ENCOUNTER — Encounter (HOSPITAL_COMMUNITY): Payer: Self-pay | Admitting: Emergency Medicine

## 2014-09-12 ENCOUNTER — Telehealth (HOSPITAL_BASED_OUTPATIENT_CLINIC_OR_DEPARTMENT_OTHER): Payer: Self-pay | Admitting: Emergency Medicine

## 2014-09-12 ENCOUNTER — Emergency Department (HOSPITAL_COMMUNITY)
Admission: EM | Admit: 2014-09-12 | Discharge: 2014-09-12 | Disposition: A | Discharge status: Home / Self Care | Payer: Self-pay | Attending: Emergency Medicine | Admitting: Emergency Medicine

## 2014-09-12 DIAGNOSIS — Z872 Personal history of diseases of the skin and subcutaneous tissue: Secondary | ICD-10-CM | POA: Insufficient documentation

## 2014-09-12 DIAGNOSIS — M79671 Pain in right foot: Secondary | ICD-10-CM | POA: Insufficient documentation

## 2014-09-12 DIAGNOSIS — Z87891 Personal history of nicotine dependence: Secondary | ICD-10-CM | POA: Insufficient documentation

## 2014-09-12 DIAGNOSIS — E669 Obesity, unspecified: Secondary | ICD-10-CM | POA: Insufficient documentation

## 2014-09-12 DIAGNOSIS — D649 Anemia, unspecified: Secondary | ICD-10-CM | POA: Insufficient documentation

## 2014-09-12 DIAGNOSIS — M79672 Pain in left foot: Secondary | ICD-10-CM | POA: Insufficient documentation

## 2014-09-12 DIAGNOSIS — M79644 Pain in right finger(s): Secondary | ICD-10-CM | POA: Insufficient documentation

## 2014-09-12 DIAGNOSIS — Z880 Allergy status to penicillin: Secondary | ICD-10-CM | POA: Insufficient documentation

## 2014-09-12 DIAGNOSIS — Z79899 Other long term (current) drug therapy: Secondary | ICD-10-CM | POA: Insufficient documentation

## 2014-09-12 DIAGNOSIS — Z8669 Personal history of other diseases of the nervous system and sense organs: Secondary | ICD-10-CM | POA: Insufficient documentation

## 2014-09-12 DIAGNOSIS — Z8679 Personal history of other diseases of the circulatory system: Secondary | ICD-10-CM | POA: Insufficient documentation

## 2014-09-12 MED ORDER — NAPROXEN 375 MG PO TABS: 375.0000 mg | ORAL_TABLET | Freq: Two times a day (BID) | ORAL | Status: DC

## 2014-09-12 MED ORDER — HYDROCODONE-ACETAMINOPHEN 5-325 MG PO TABS: 1.0000 | ORAL_TABLET | Freq: Four times a day (QID) | ORAL | Status: DC | PRN

## 2014-09-12 NOTE — Discharge Instructions (Signed)
Plantar Fasciitis (Heel Spur Syndrome) with Rehab The plantar fascia is a fibrous, ligament-like, soft-tissue structure that spans the bottom of the foot. Plantar fasciitis is a condition that causes pain in the foot due to inflammation of the tissue. SYMPTOMS   Pain and tenderness on the underneath side of the foot.  Pain that worsens with standing or walking. CAUSES  Plantar fasciitis is caused by irritation and injury to the plantar fascia on the underneath side of the foot. Common mechanisms of injury include:  Direct trauma to bottom of the foot.  Damage to a small nerve that runs under the foot where the main fascia attaches to the heel bone.  Stress placed on the plantar fascia due to bone spurs. RISK INCREASES WITH:   Activities that place stress on the plantar fascia (running, jumping, pivoting, or cutting).  Poor strength and flexibility.  Improperly fitted shoes.  Tight calf muscles.  Flat feet.  Failure to warm-up properly before activity.  Obesity. PREVENTION  Warm up and stretch properly before activity.  Allow for adequate recovery between workouts.  Maintain physical fitness:  Strength, flexibility, and endurance.  Cardiovascular fitness.  Maintain a health body weight.  Avoid stress on the plantar fascia.  Wear properly fitted shoes, including arch supports for individuals who have flat feet. PROGNOSIS  If treated properly, then the symptoms of plantar fasciitis usually resolve without surgery. However, occasionally surgery is necessary. RELATED COMPLICATIONS   Recurrent symptoms that may result in a chronic condition.  Problems of the lower back that are caused by compensating for the injury, such as limping.  Pain or weakness of the foot during push-off following surgery.  Chronic inflammation, scarring, and partial or complete fascia tear, occurring more often from repeated injections. TREATMENT  Treatment initially involves the use of  ice and medication to help reduce pain and inflammation. The use of strengthening and stretching exercises may help reduce pain with activity, especially stretches of the Achilles tendon. These exercises may be performed at home or with a therapist. Your caregiver may recommend that you use heel cups of arch supports to help reduce stress on the plantar fascia. Occasionally, corticosteroid injections are given to reduce inflammation. If symptoms persist for greater than 6 months despite non-surgical (conservative), then surgery may be recommended.  MEDICATION   If pain medication is necessary, then nonsteroidal anti-inflammatory medications, such as aspirin and ibuprofen, or other minor pain relievers, such as acetaminophen, are often recommended.  Do not take pain medication within 7 days before surgery.  Prescription pain relievers may be given if deemed necessary by your caregiver. Use only as directed and only as much as you need.  Corticosteroid injections may be given by your caregiver. These injections should be reserved for the most serious cases, because they may only be given a certain number of times. HEAT AND COLD  Cold treatment (icing) relieves pain and reduces inflammation. Cold treatment should be applied for 10 to 15 minutes every 2 to 3 hours for inflammation and pain and immediately after any activity that aggravates your symptoms. Use ice packs or massage the area with a piece of ice (ice massage).  Heat treatment may be used prior to performing the stretching and strengthening activities prescribed by your caregiver, physical therapist, or athletic trainer. Use a heat pack or soak the injury in warm water. SEEK IMMEDIATE MEDICAL CARE IF:  Treatment seems to offer no benefit, or the condition worsens.  Any medications produce adverse side effects. EXERCISES RANGE   OF MOTION (ROM) AND STRETCHING EXERCISES - Plantar Fasciitis (Heel Spur Syndrome) These exercises may help you  when beginning to rehabilitate your injury. Your symptoms may resolve with or without further involvement from your physician, physical therapist or athletic trainer. While completing these exercises, remember:   Restoring tissue flexibility helps normal motion to return to the joints. This allows healthier, less painful movement and activity.  An effective stretch should be held for at least 30 seconds.  A stretch should never be painful. You should only feel a gentle lengthening or release in the stretched tissue. RANGE OF MOTION - Toe Extension, Flexion  Sit with your right / left leg crossed over your opposite knee.  Grasp your toes and gently pull them back toward the top of your foot. You should feel a stretch on the bottom of your toes and/or foot.  Hold this stretch for __________ seconds.  Now, gently pull your toes toward the bottom of your foot. You should feel a stretch on the top of your toes and or foot.  Hold this stretch for __________ seconds. Repeat __________ times. Complete this stretch __________ times per day.  RANGE OF MOTION - Ankle Dorsiflexion, Active Assisted  Remove shoes and sit on a chair that is preferably not on a carpeted surface.  Place right / left foot under knee. Extend your opposite leg for support.  Keeping your heel down, slide your right / left foot back toward the chair until you feel a stretch at your ankle or calf. If you do not feel a stretch, slide your bottom forward to the edge of the chair, while still keeping your heel down.  Hold this stretch for __________ seconds. Repeat __________ times. Complete this stretch __________ times per day.  STRETCH - Gastroc, Standing  Place hands on wall.  Extend right / left leg, keeping the front knee somewhat bent.  Slightly point your toes inward on your back foot.  Keeping your right / left heel on the floor and your knee straight, shift your weight toward the wall, not allowing your back to  arch.  You should feel a gentle stretch in the right / left calf. Hold this position for __________ seconds. Repeat __________ times. Complete this stretch __________ times per day. STRETCH - Soleus, Standing  Place hands on wall.  Extend right / left leg, keeping the other knee somewhat bent.  Slightly point your toes inward on your back foot.  Keep your right / left heel on the floor, bend your back knee, and slightly shift your weight over the back leg so that you feel a gentle stretch deep in your back calf.  Hold this position for __________ seconds. Repeat __________ times. Complete this stretch __________ times per day. STRETCH - Gastrocsoleus, Standing  Note: This exercise can place a lot of stress on your foot and ankle. Please complete this exercise only if specifically instructed by your caregiver.   Place the ball of your right / left foot on a step, keeping your other foot firmly on the same step.  Hold on to the wall or a rail for balance.  Slowly lift your other foot, allowing your body weight to press your heel down over the edge of the step.  You should feel a stretch in your right / left calf.  Hold this position for __________ seconds.  Repeat this exercise with a slight bend in your right / left knee. Repeat __________ times. Complete this stretch __________ times per day.    STRENGTHENING EXERCISES - Plantar Fasciitis (Heel Spur Syndrome)  These exercises may help you when beginning to rehabilitate your injury. They may resolve your symptoms with or without further involvement from your physician, physical therapist or athletic trainer. While completing these exercises, remember:   Muscles can gain both the endurance and the strength needed for everyday activities through controlled exercises.  Complete these exercises as instructed by your physician, physical therapist or athletic trainer. Progress the resistance and repetitions only as guided. STRENGTH -  Towel Curls  Sit in a chair positioned on a non-carpeted surface.  Place your foot on a towel, keeping your heel on the floor.  Pull the towel toward your heel by only curling your toes. Keep your heel on the floor.  If instructed by your physician, physical therapist or athletic trainer, add ____________________ at the end of the towel. Repeat __________ times. Complete this exercise __________ times per day. STRENGTH - Ankle Inversion  Secure one end of a rubber exercise band/tubing to a fixed object (table, pole). Loop the other end around your foot just before your toes.  Place your fists between your knees. This will focus your strengthening at your ankle.  Slowly, pull your big toe up and in, making sure the band/tubing is positioned to resist the entire motion.  Hold this position for __________ seconds.  Have your muscles resist the band/tubing as it slowly pulls your foot back to the starting position. Repeat __________ times. Complete this exercises __________ times per day.  Document Released: 03/23/2005 Document Revised: 06/15/2011 Document Reviewed: 07/05/2008 [employer] Patient Information 2015 [employer]. This information is not intended to replace advice given to you by your health care provider. Make sure you discuss any questions you have with your health care provider.  

## 2014-09-12 NOTE — ED Notes (Signed)
Pt is c/o bilateral foot pain   Pt states she has a job that she stands a lot   Pt states when she gets home and sits down then tries to get up the pain is awful and it is hard to balance  Pt has hx of plantar fascitis and heel spurs  Pt is also c/o pain to her right ring finger

## 2014-09-12 NOTE — ED Provider Notes (Signed)
CSN: [id number]     Arrival date & time 09/12/14  0919 History   First MD Initiated Contact with Patient 09/12/14 1002     Chief Complaint  Patient presents with  . Foot Pain     (Consider location/radiation/quality/duration/timing/severity/associated sxs/prior Treatment) HPI Comments: Patient presents to the emergency department with chief complaint of bilateral foot pain times months. 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 She is always on her feet. She has been diagnosed with plantar fasciitis as well as with heel spurs. She states that she has tried orthotics with no relief. She has not tried taking any medication. She also complains of pain in her right ring finger. All of her symptoms have been ongoing for weeks to months.  The history is provided by the patient. No language interpreter was used.    Past Medical History  Diagnosis Date  . Allergy   . Anemia   . Recurrent boils   . Nearsightedness     wears glasses  . Chronic headache   . Arrhythmia   . WPW (Wolff-Parkinson-White syndrome)   . Obesity    Past Surgical History  Procedure Laterality Date  . Cystectomy      tonsils   Family History  Problem Relation Age of Onset  . Breast cancer Mother   . Pulmonary embolism Mother     died of PE  . Diabetes Paternal Grandmother   . Heart disease Neg Hx   . Stroke Neg Hx   . Colon cancer Mother   . Irritable bowel syndrome Mother    History  Substance Use Topics  . Smoking status: Former Smoker -- .5 years    Types: Cigarettes  . Smokeless tobacco: Never Used     Comment: smokes black and milds  . Alcohol Use: No   OB History    Gravida Para Term Preterm AB TAB SAB Ectopic Multiple Living   0              Review of Systems  Constitutional: Negative for fever and chills.  Respiratory: Negative for shortness of breath.   Cardiovascular: Negative for chest pain.  Gastrointestinal: Negative for nausea, vomiting, diarrhea and constipation.  Genitourinary:  Negative for dysuria.  Musculoskeletal: Positive for arthralgias.      Allergies  Other; Penicillins; and Shrimp  Home Medications   Prior to Admission medications   Medication Sig Start Date End Date Taking? Authorizing Provider  Aspirin-Salicylamide-Caffeine (BC HEADACHE POWDER PO) Take 1 each by mouth daily as needed (pain).   Yes Historical Provider, MD  cetirizine (ZYRTEC) 10 MG tablet Take 10 mg by mouth daily.   Yes Historical Provider, MD  ferrous sulfate 325 (65 FE) MG tablet Take 1 tablet (325 mg total) by mouth daily with breakfast. 08/16/13  Yes Junius Creamer, NP  ibuprofen (ADVIL,MOTRIN) 600 MG tablet Take 1 tablet (600 mg total) by mouth every 6 (six) hours as needed. Patient taking differently: Take 600 mg by mouth every 6 (six) hours as needed for moderate pain.  06/17/14  Yes [name], MD  traMADol (ULTRAM) 50 MG tablet Take 1 tablet (50 mg total) by mouth every 6 (six) hours as needed. 06/22/14  Yes Melony Overly, MD  clindamycin (CLEOCIN) 300 MG capsule Take 1 capsule (300 mg total) by mouth 3 (three) times daily. Patient not taking: Reported on 09/12/2014 08/10/14   Janne Napoleon, NP  fluconazole (DIFLUCAN) 150 MG tablet Take 1 tablet for yeast infection Patient not taking: Reported on  09/12/2014 08/10/14   Janne Napoleon, NP  indomethacin (INDOCIN) 50 MG capsule Take 1 capsule (50 mg total) by mouth 3 (three) times daily as needed. Take with food Patient not taking: Reported on 09/12/2014 07/05/14   Janne Napoleon, NP  metoprolol tartrate (LOPRESSOR) 25 MG tablet Take 1 tablet (25 mg total) by mouth 2 (two) times daily. Patient not taking: Reported on 09/12/2014 08/22/13   Evans Lance, MD  mupirocin nasal ointment (BACTROBAN) 2 % Apply to affected area BID x 7 days Patient not taking: Reported on 09/12/2014 05/09/14   Annett Gula H Presson, PA   BP 106/67 mmHg  Pulse 86  Temp(Src) 98.2 F (36.8 C) (Oral)  Resp 18  SpO2 98%  LMP 09/07/2014 (Exact Date) Physical Exam  Constitutional:  She is oriented to person, place, and time. She appears well-developed and well-nourished.  HENT:  Head: Normocephalic and atraumatic.  Eyes: Conjunctivae and EOM are normal.  Neck: Normal range of motion.  Cardiovascular: Normal rate.   Pulmonary/Chest: Effort normal.  Abdominal: She exhibits no distension.  Musculoskeletal: Normal range of motion.  Normal range of motion and strength of bilateral ankles and feet, no bony abnormality or deformity, no tenderness to palpation, no evidence of septic joint, no sign of cellulitis, no evidence of DVT  Boutonniere deformity of right ring finger  Neurological: She is alert and oriented to person, place, and time.  Skin: Skin is dry.  Psychiatric: She has a normal mood and affect. Her behavior is normal. Judgment and thought content normal.  Nursing note and vitals reviewed.   ED Course  Procedures (including critical care time) Labs Review Labs Reviewed - No data to display  Imaging Review No results found.   EKG Interpretation None      MDM   Final diagnoses:  Foot pain, bilateral  Finger pain, right    Patient with bilateral foot pain and right ring finger pain. Concern for tendinitis, fasciitis, possible rheumatologic process. Will recommend follow-up with primary care or rheumatology. I will discharge the patient with a small amount of pain medicine and anti-inflammatory. Recommend gentle stretching. Patient understands and agrees plan. She is stable and ready for discharge.    Montine Circle, PA-C 09/12/14 [place], MD 09/12/14 1318

## 2014-10-31 ENCOUNTER — Encounter (HOSPITAL_COMMUNITY): Payer: Self-pay

## 2014-10-31 ENCOUNTER — Emergency Department (HOSPITAL_COMMUNITY)
Admission: EM | Admit: 2014-10-31 | Discharge: 2014-10-31 | Disposition: A | Discharge status: Home / Self Care | Payer: Self-pay | Attending: Emergency Medicine | Admitting: Emergency Medicine

## 2014-10-31 DIAGNOSIS — D649 Anemia, unspecified: Secondary | ICD-10-CM | POA: Insufficient documentation

## 2014-10-31 DIAGNOSIS — J069 Acute upper respiratory infection, unspecified: Secondary | ICD-10-CM | POA: Insufficient documentation

## 2014-10-31 DIAGNOSIS — I499 Cardiac arrhythmia, unspecified: Secondary | ICD-10-CM | POA: Insufficient documentation

## 2014-10-31 DIAGNOSIS — I456 Pre-excitation syndrome: Secondary | ICD-10-CM | POA: Insufficient documentation

## 2014-10-31 DIAGNOSIS — G8929 Other chronic pain: Secondary | ICD-10-CM | POA: Insufficient documentation

## 2014-10-31 DIAGNOSIS — E669 Obesity, unspecified: Secondary | ICD-10-CM | POA: Insufficient documentation

## 2014-10-31 DIAGNOSIS — Z87891 Personal history of nicotine dependence: Secondary | ICD-10-CM | POA: Insufficient documentation

## 2014-10-31 DIAGNOSIS — M542 Cervicalgia: Secondary | ICD-10-CM | POA: Insufficient documentation

## 2014-10-31 DIAGNOSIS — H9203 Otalgia, bilateral: Secondary | ICD-10-CM | POA: Insufficient documentation

## 2014-10-31 DIAGNOSIS — Z872 Personal history of diseases of the skin and subcutaneous tissue: Secondary | ICD-10-CM | POA: Insufficient documentation

## 2014-10-31 DIAGNOSIS — Z880 Allergy status to penicillin: Secondary | ICD-10-CM | POA: Insufficient documentation

## 2014-10-31 DIAGNOSIS — Z79899 Other long term (current) drug therapy: Secondary | ICD-10-CM | POA: Insufficient documentation

## 2014-10-31 DIAGNOSIS — J309 Allergic rhinitis, unspecified: Secondary | ICD-10-CM | POA: Insufficient documentation

## 2014-10-31 MED ORDER — FLUTICASONE PROPIONATE 50 MCG/ACT NA SUSP: 2.0000 | Freq: Every day | NASAL | Status: DC

## 2014-10-31 MED ORDER — CETIRIZINE HCL 10 MG PO TABS: 10.0000 mg | ORAL_TABLET | Freq: Every day | ORAL | Status: DC

## 2014-10-31 MED ORDER — ACETAMINOPHEN 325 MG PO TABS
650.0000 mg | ORAL_TABLET | Freq: Once | ORAL | Status: AC
  Administered 2014-10-31: 650 mg via ORAL
  Filled 2014-10-31: qty 2

## 2014-10-31 MED ORDER — NAPROXEN 250 MG PO TABS: 250.0000 mg | ORAL_TABLET | Freq: Two times a day (BID) | ORAL | Status: DC

## 2014-10-31 MED ORDER — DM-GUAIFENESIN ER 30-600 MG PO TB12: 1.0000 | ORAL_TABLET | Freq: Two times a day (BID) | ORAL | Status: DC

## 2014-10-31 MED ORDER — DIPHENHYDRAMINE HCL 25 MG PO CAPS
25.0000 mg | ORAL_CAPSULE | Freq: Once | ORAL | Status: AC
  Administered 2014-10-31: 25 mg via ORAL
  Filled 2014-10-31: qty 1

## 2014-10-31 NOTE — ED Notes (Signed)
Patient c/o ear pain L>R. Sinus congestion, and left neck pain.x 1 week.

## 2014-10-31 NOTE — ED Provider Notes (Signed)
History  This chart was scribed for non-physician practitioner, Waynetta Pean, PA-C,working with Charlesetta Shanks, MD, by Marlowe Kays, ED Scribe. This patient was seen in room WTR6/WTR6 and the patient's care was started at 3:06 PM.  Chief Complaint  Patient presents with  . Otalgia  . Nasal Congestion  . Neck Pain   The history is provided by the patient and medical records. No language interpreter was used.    HPI Comments:  Amanda Davenport is a 34 y.o. female who presents to the Emergency Department complaining of bilateral ear pain, with the left being worse than the right, that began approximately one week ago. She reports associated sinus congestion and left-sided neck pain, eye itching, post nasal drip, cough, rhinorrhea and subjective fever. She rates her pain at 7/10. She reports taking a cetirizine tablet earlier today with no significant relief of her symptoms. She denies modifying factors. She denies sore throat, trouble swallowing, nausea or vomiting. Denies h/o DM. Pt is approved to be seen at [facility] but states she has not been able to get an appointment.  Past Medical History  Diagnosis Date  . Allergy   . Anemia   . Recurrent boils   . Nearsightedness     wears glasses  . Chronic headache   . Arrhythmia   . WPW (Wolff-Parkinson-White syndrome)   . Obesity    Past Surgical History  Procedure Laterality Date  . Cystectomy      tonsils   Family History  Problem Relation Age of Onset  . Breast cancer Mother   . Pulmonary embolism Mother     died of PE  . Diabetes Paternal Grandmother   . Heart disease Neg Hx   . Stroke Neg Hx   . Colon cancer Mother   . Irritable bowel syndrome Mother    History  Substance Use Topics  . Smoking status: Former Smoker -- .5 years    Types: Cigarettes  . Smokeless tobacco: Never Used     Comment: smokes black and milds  . Alcohol Use: No   OB History    Gravida Para Term Preterm AB TAB SAB Ectopic  Multiple Living   0              Review of Systems  Constitutional: Positive for fever (subjective). Negative for appetite change.  HENT: Positive for congestion, ear pain, postnasal drip, rhinorrhea, sinus pressure and sneezing. Negative for dental problem, ear discharge, facial swelling, mouth sores, sore throat and trouble swallowing.   Eyes: Positive for itching. Negative for visual disturbance.  Respiratory: Negative for shortness of breath and wheezing.   Gastrointestinal: Negative for nausea, vomiting and abdominal pain.  Musculoskeletal: Positive for neck pain.  Skin: Negative for rash.  Neurological: Negative for light-headedness.    Allergies  Other; Penicillins; and Shrimp  Home Medications   Prior to Admission medications   Medication Sig Start Date End Date Taking? Authorizing Provider  Aspirin-Salicylamide-Caffeine (BC HEADACHE POWDER PO) Take 1 each by mouth daily as needed (pain).    Historical Provider, MD  cetirizine (ZYRTEC ALLERGY) 10 MG tablet Take 1 tablet (10 mg total) by mouth daily. 10/31/14   Waynetta Pean, PA-C  clindamycin (CLEOCIN) 300 MG capsule Take 1 capsule (300 mg total) by mouth 3 (three) times daily. Patient not taking: Reported on 09/12/2014 08/10/14   Janne Napoleon, NP  dextromethorphan-guaiFENesin [facility] DM) 30-600 MG per 12 hr tablet Take 1 tablet by mouth 2 (two) times daily. 10/31/14  Waynetta Pean, PA-C  ferrous sulfate 325 (65 FE) MG tablet Take 1 tablet (325 mg total) by mouth daily with breakfast. 08/16/13   Junius Creamer, NP  fluconazole (DIFLUCAN) 150 MG tablet Take 1 tablet for yeast infection Patient not taking: Reported on 09/12/2014 08/10/14   Janne Napoleon, NP  fluticasone (FLONASE) 50 MCG/ACT nasal spray Place 2 sprays into both nostrils daily. 10/31/14   Waynetta Pean, PA-C  HYDROcodone-acetaminophen (NORCO/VICODIN) 5-325 MG per tablet Take 1-2 tablets by mouth every 6 (six) hours as needed. 09/12/14   Montine Circle, PA-C  ibuprofen  (ADVIL,MOTRIN) 600 MG tablet Take 1 tablet (600 mg total) by mouth every 6 (six) hours as needed. Patient taking differently: Take 600 mg by mouth every 6 (six) hours as needed for moderate pain.  06/17/14   [name], MD  indomethacin (INDOCIN) 50 MG capsule Take 1 capsule (50 mg total) by mouth 3 (three) times daily as needed. Take with food Patient not taking: Reported on 09/12/2014 07/05/14   Janne Napoleon, NP  metoprolol tartrate (LOPRESSOR) 25 MG tablet Take 1 tablet (25 mg total) by mouth 2 (two) times daily. Patient not taking: Reported on 09/12/2014 08/22/13   Evans Lance, MD  mupirocin nasal ointment (BACTROBAN) 2 % Apply to affected area BID x 7 days Patient not taking: Reported on 09/12/2014 05/09/14   Lutricia Feil, PA  naproxen (NAPROSYN) 250 MG tablet Take 1 tablet (250 mg total) by mouth 2 (two) times daily with a meal. 10/31/14   Waynetta Pean, PA-C  traMADol (ULTRAM) 50 MG tablet Take 1 tablet (50 mg total) by mouth every 6 (six) hours as needed. 06/22/14   Melony Overly, MD   Triage Vitals: BP 113/65 mmHg  Pulse 86  Temp(Src) 98.2 F (36.8 C) (Oral)  Resp 16  Ht 5\' 9"  (1.753 m)  Wt 278 lb (126.1 kg)  BMI 41.03 kg/m2  SpO2 100%  LMP 10/31/2014 Physical Exam  Constitutional: She is oriented to person, place, and time. She appears well-developed and well-nourished. No distress.  Nontoxic appearing.  HENT:  Head: Normocephalic and atraumatic.  Right Ear: Tympanic membrane and external ear normal.  Left Ear: Tympanic membrane and external ear normal.  Mouth/Throat: Uvula is midline, oropharynx is clear and moist and mucous membranes are normal. No oropharyngeal exudate, posterior oropharyngeal edema or posterior oropharyngeal erythema.  Boggy nasal turbinates. Bilateral tympanic membranes are pearly-gray without erythema or loss of landmarks with mild inner ear effusion. No tonsillar hypertrophy or exudates.  Eyes: Conjunctivae are normal. Pupils are equal, round, and  reactive to light. Right eye exhibits no discharge. Left eye exhibits no discharge.  Neck: Normal range of motion. Neck supple. No JVD present. No tracheal deviation present.  Patient has full range of motion of her neck. Patient able to place her chin to chest multiple the ceiling pitches of the right forehead in greater than 45 in each direction.  Cardiovascular: Normal rate, regular rhythm, normal heart sounds and intact distal pulses.   Pulmonary/Chest: Effort normal and breath sounds normal. No respiratory distress. She has no wheezes. She has no rales.  Lungs clear to auscultation bilaterally.  Abdominal: Soft. There is no tenderness. There is no guarding.  Lymphadenopathy:    She has no cervical adenopathy.  Neurological: She is alert and oriented to person, place, and time. Coordination normal.  Skin: Skin is warm and dry. No rash noted. She is not diaphoretic. No erythema. No pallor.  Psychiatric: She has a normal  mood and affect. Her behavior is normal.  Nursing note and vitals reviewed.   ED Course  Procedures (including critical care time) DIAGNOSTIC STUDIES: Oxygen Saturation is 100% on RA, normal by my interpretation.   COORDINATION OF CARE: 3:16 PM- Will prescribe Flonase, Zyrtec, Mucinex DM, Naproxen and Benadryl. Will order Benadryl and Tylenol prior to discharge. Pt verbalizes understanding and agrees to plan.  Medications  acetaminophen (TYLENOL) tablet 650 mg (not administered)  diphenhydrAMINE (BENADRYL) capsule 25 mg (not administered)    Labs Review Labs Reviewed - No data to display  Imaging Review No results found.   EKG Interpretation None      Filed Vitals:   10/31/14 1423  BP: 113/65  Pulse: 86  Temp: 98.2 F (36.8 C)  TempSrc: Oral  Resp: 16  Height: 5\' 9"  (1.753 m)  Weight: 278 lb (126.1 kg)  SpO2: 100%     MDM   Meds given in ED:  Medications  acetaminophen (TYLENOL) tablet 650 mg (not administered)  diphenhydrAMINE (BENADRYL)  capsule 25 mg (not administered)    New Prescriptions   CETIRIZINE (ZYRTEC ALLERGY) 10 MG TABLET    Take 1 tablet (10 mg total) by mouth daily.   DEXTROMETHORPHAN-GUAIFENESIN (MUCINEX DM) 30-600 MG PER 12 HR TABLET    Take 1 tablet by mouth 2 (two) times daily.   FLUTICASONE (FLONASE) 50 MCG/ACT NASAL SPRAY    Place 2 sprays into both nostrils daily.   NAPROXEN (NAPROSYN) 250 MG TABLET    Take 1 tablet (250 mg total) by mouth 2 (two) times daily with a meal.    Final diagnoses:  URI (upper respiratory infection)  Allergic rhinitis, unspecified allergic rhinitis type    This  is a 34 y.o. female who presents to the Emergency Department complaining of bilateral ear pain, with the left being worse than the right, that began approximately one week ago. She reports associated sinus congestion and left-sided neck pain, eye itching, post nasal drip, cough, rhinorrhea and subjective fever. On exam the patient is afebrile and nontoxic. Bilateral tympanic membranes are pearly-gray without erythema or loss of landmarks with a mild inner ear effusion. No tonsillar hypertrophy or exudates. No posterior oropharyngeal erythema or edema. Lungs are clear to auscultation bilaterally. No neck rigidity. Patient's symptoms and exam are consistent with an upper respiratory infection as well as symptoms of allergic rhinitis. We'll discharge patient prescriptions for cetirizine, Mucinex, fluticasone nasal spray and naproxen. I advised the patient to follow-up with their primary care provider this week. I advised the patient to return to the emergency department with new or worsening symptoms or new concerns. The patient verbalized understanding and agreement with plan.     I personally performed the services described in this documentation, which was scribed in my presence. The recorded information has been reviewed and is accurate.    Waynetta Pean, PA-C 10/31/14 1529  Charlesetta Shanks, MD 11/12/14 [phone]

## 2014-10-31 NOTE — Discharge Instructions (Signed)
Upper Respiratory Infection, Adult An upper respiratory infection (URI) is also sometimes known as the common cold. The upper respiratory tract includes the nose, sinuses, throat, trachea, and bronchi. Bronchi are the airways leading to the lungs. Most people improve within 1 week, but symptoms can last up to 2 weeks. A residual cough may last even longer.  CAUSES Many different viruses can infect the tissues lining the upper respiratory tract. The tissues become irritated and inflamed and often become very moist. Mucus production is also common. A cold is contagious. You can easily spread the virus to others by oral contact. This includes kissing, sharing a glass, coughing, or sneezing. Touching your mouth or nose and then touching a surface, which is then touched by another person, can also spread the virus. SYMPTOMS  Symptoms typically develop 1 to 3 days after you come in contact with a cold virus. Symptoms vary from person to person. They may include:  Runny nose.  Sneezing.  Nasal congestion.  Sinus irritation.  Sore throat.  Loss of voice (laryngitis).  Cough.  Fatigue.  Muscle aches.  Loss of appetite.  Headache.  Low-grade fever. DIAGNOSIS  You might diagnose your own cold based on familiar symptoms, since most people get a cold 2 to 3 times a year. Your caregiver can confirm this based on your exam. Most importantly, your caregiver can check that your symptoms are not due to another disease such as strep throat, sinusitis, pneumonia, asthma, or epiglottitis. Blood tests, throat tests, and X-rays are not necessary to diagnose a common cold, but they may sometimes be helpful in excluding other more serious diseases. Your caregiver will decide if any further tests are required. RISKS AND COMPLICATIONS  You may be at risk for a more severe case of the common cold if you smoke cigarettes, have chronic heart disease (such as heart failure) or lung disease (such as asthma), or if  you have a weakened immune system. The very young and very old are also at risk for more serious infections. Bacterial sinusitis, middle ear infections, and bacterial pneumonia can complicate the common cold. The common cold can worsen asthma and chronic obstructive pulmonary disease (COPD). Sometimes, these complications can require emergency medical care and may be life-threatening. PREVENTION  The best way to protect against getting a cold is to practice good hygiene. Avoid oral or hand contact with people with cold symptoms. Wash your hands often if contact occurs. There is no clear evidence that vitamin C, vitamin E, echinacea, or exercise reduces the chance of developing a cold. However, it is always recommended to get plenty of rest and practice good nutrition. TREATMENT  Treatment is directed at relieving symptoms. There is no cure. Antibiotics are not effective, because the infection is caused by a virus, not by bacteria. Treatment may include:  Increased fluid intake. Sports drinks offer valuable electrolytes, sugars, and fluids.  Breathing heated mist or steam (vaporizer or shower).  Eating chicken soup or other clear broths, and maintaining good nutrition.  Getting plenty of rest.  Using gargles or lozenges for comfort.  Controlling fevers with ibuprofen or acetaminophen as directed by your caregiver.  Increasing usage of your inhaler if you have asthma. Zinc gel and zinc lozenges, taken in the first 24 hours of the common cold, can shorten the duration and lessen the severity of symptoms. Pain medicines may help with fever, muscle aches, and throat pain. A variety of non-prescription medicines are available to treat congestion and runny nose. Your caregiver  can make recommendations and may suggest nasal or lung inhalers for other symptoms.  HOME CARE INSTRUCTIONS   Only take over-the-counter or prescription medicines for pain, discomfort, or fever as directed by your  caregiver.  Use a warm mist humidifier or inhale steam from a shower to increase air moisture. This may keep secretions moist and make it easier to breathe.  Drink enough water and fluids to keep your urine clear or pale yellow.  Rest as needed.  Return to work when your temperature has returned to normal or as your caregiver advises. You may need to stay home longer to avoid infecting others. You can also use a face mask and careful hand washing to prevent spread of the virus. SEEK MEDICAL CARE IF:   After the first few days, you feel you are getting worse rather than better.  You need your caregiver's advice about medicines to control symptoms.  You develop chills, worsening shortness of breath, or brown or red sputum. These may be signs of pneumonia.  You develop yellow or brown nasal discharge or pain in the face, especially when you bend forward. These may be signs of sinusitis.  You develop a fever, swollen neck glands, pain with swallowing, or white areas in the back of your throat. These may be signs of strep throat. SEEK IMMEDIATE MEDICAL CARE IF:   You have a fever.  You develop severe or persistent headache, ear pain, sinus pain, or chest pain.  You develop wheezing, a prolonged cough, cough up blood, or have a change in your usual mucus (if you have chronic lung disease).  You develop sore muscles or a stiff neck. Document Released: 09/16/2000 Document Revised: 06/15/2011 Document Reviewed: 06/28/2013 [facility] Patient Information 2015 [place], [place]. This information is not intended to replace advice given to you by your health care provider. Make sure you discuss any questions you have with your health care provider.  Allergic Rhinitis Allergic rhinitis is when the mucous membranes in the nose respond to allergens. Allergens are particles in the air that cause your body to have an allergic reaction. This causes you to release allergic antibodies. Through a chain of  events, these eventually cause you to release histamine into the blood stream. Although meant to protect the body, it is this release of histamine that causes your discomfort, such as frequent sneezing, congestion, and an itchy, runny nose.  CAUSES  Seasonal allergic rhinitis (hay fever) is caused by pollen allergens that may come from grasses, trees, and weeds. Year-round allergic rhinitis (perennial allergic rhinitis) is caused by allergens such as house dust mites, pet dander, and mold spores.  SYMPTOMS   Nasal stuffiness (congestion).  Itchy, runny nose with sneezing and tearing of the eyes. DIAGNOSIS  Your health care provider can help you determine the allergen or allergens that trigger your symptoms. If you and your health care provider are unable to determine the allergen, skin or blood testing may be used. TREATMENT  Allergic rhinitis does not have a cure, but it can be controlled by:  Medicines and allergy shots (immunotherapy).  Avoiding the allergen. Hay fever may often be treated with antihistamines in pill or nasal spray forms. Antihistamines block the effects of histamine. There are over-the-counter medicines that may help with nasal congestion and swelling around the eyes. Check with your health care provider before taking or giving this medicine.  If avoiding the allergen or the medicine prescribed do not work, there are many new medicines your health care provider can  prescribe. Stronger medicine may be used if initial measures are ineffective. Desensitizing injections can be used if medicine and avoidance does not work. Desensitization is when a patient is given ongoing shots until the body becomes less sensitive to the allergen. Make sure you follow up with your health care provider if problems continue. HOME CARE INSTRUCTIONS It is not possible to completely avoid allergens, but you can reduce your symptoms by taking steps to limit your exposure to them. It helps to know  exactly what you are allergic to so that you can avoid your specific triggers. SEEK MEDICAL CARE IF:   You have a fever.  You develop a cough that does not stop easily (persistent).  You have shortness of breath.  You start wheezing.  Symptoms interfere with normal daily activities. Document Released: 12/16/2000 Document Revised: 03/28/2013 Document Reviewed: 11/28/2012 [facility] Patient Information 2015 [place], [place]. This information is not intended to replace advice given to you by your health care provider. Make sure you discuss any questions you have with your health care provider.

## 2014-11-06 ENCOUNTER — Encounter (HOSPITAL_COMMUNITY): Payer: Self-pay

## 2014-11-06 ENCOUNTER — Emergency Department (HOSPITAL_COMMUNITY)
Admission: EM | Admit: 2014-11-06 | Discharge: 2014-11-06 | Disposition: A | Discharge status: Home / Self Care | Payer: Self-pay | Attending: Emergency Medicine | Admitting: Emergency Medicine

## 2014-11-06 DIAGNOSIS — Z880 Allergy status to penicillin: Secondary | ICD-10-CM | POA: Insufficient documentation

## 2014-11-06 DIAGNOSIS — R0981 Nasal congestion: Secondary | ICD-10-CM | POA: Insufficient documentation

## 2014-11-06 DIAGNOSIS — Z87891 Personal history of nicotine dependence: Secondary | ICD-10-CM | POA: Insufficient documentation

## 2014-11-06 DIAGNOSIS — H9209 Otalgia, unspecified ear: Secondary | ICD-10-CM | POA: Insufficient documentation

## 2014-11-06 DIAGNOSIS — G8929 Other chronic pain: Secondary | ICD-10-CM | POA: Insufficient documentation

## 2014-11-06 DIAGNOSIS — E669 Obesity, unspecified: Secondary | ICD-10-CM | POA: Insufficient documentation

## 2014-11-06 DIAGNOSIS — Z8679 Personal history of other diseases of the circulatory system: Secondary | ICD-10-CM | POA: Insufficient documentation

## 2014-11-06 DIAGNOSIS — M62838 Other muscle spasm: Secondary | ICD-10-CM | POA: Insufficient documentation

## 2014-11-06 DIAGNOSIS — D649 Anemia, unspecified: Secondary | ICD-10-CM | POA: Insufficient documentation

## 2014-11-06 DIAGNOSIS — Z872 Personal history of diseases of the skin and subcutaneous tissue: Secondary | ICD-10-CM | POA: Insufficient documentation

## 2014-11-06 MED ORDER — NAPROXEN 250 MG PO TABS
500.0000 mg | ORAL_TABLET | Freq: Once | ORAL | Status: AC
  Administered 2014-11-06: 500 mg via ORAL
  Filled 2014-11-06: qty 2

## 2014-11-06 MED ORDER — METHOCARBAMOL 750 MG PO TABS: 750.0000 mg | ORAL_TABLET | Freq: Three times a day (TID) | ORAL | Status: DC | PRN

## 2014-11-06 MED ORDER — TRAMADOL HCL 50 MG PO TABS: 50.0000 mg | ORAL_TABLET | Freq: Four times a day (QID) | ORAL | Status: DC | PRN

## 2014-11-06 MED ORDER — METHOCARBAMOL 500 MG PO TABS
1000.0000 mg | ORAL_TABLET | Freq: Once | ORAL | Status: AC
  Administered 2014-11-06: 1000 mg via ORAL
  Filled 2014-11-06: qty 2

## 2014-11-06 MED ORDER — NAPROXEN 250 MG PO TABS: 250.0000 mg | ORAL_TABLET | Freq: Two times a day (BID) | ORAL | Status: DC

## 2014-11-06 MED ORDER — HYDROCODONE-ACETAMINOPHEN 5-325 MG PO TABS
2.0000 | ORAL_TABLET | Freq: Once | ORAL | Status: AC
  Administered 2014-11-06: 2 via ORAL
  Filled 2014-11-06: qty 2

## 2014-11-06 NOTE — Discharge Instructions (Signed)
Heat Therapy °Heat therapy can help ease sore, stiff, injured, and tight muscles and joints. Heat relaxes your muscles, which may help ease your pain.  °RISKS AND COMPLICATIONS °If you have any of the following conditions, do not use heat therapy unless your health care provider has approved: °· Poor circulation. °· Healing wounds or scarred skin in the area being treated. °· Diabetes, heart disease, or high blood pressure. °· Not being able to feel (numbness) the area being treated. °· Unusual swelling of the area being treated. °· Active infections. °· Blood clots. °· Cancer. °· Inability to communicate pain. This may include young children and people who have problems with their brain function (dementia). °· Pregnancy. °Heat therapy should only be used on old, pre-existing, or long-lasting (chronic) injuries. Do not use heat therapy on new injuries unless directed by your health care provider. °HOW TO USE HEAT THERAPY °There are several different kinds of heat therapy, including: °· Moist heat pack. °· Warm water bath. °· Hot water bottle. °· Electric heating pad. °· Heated gel pack. °· Heated wrap. °· Electric heating pad. °Use the heat therapy method suggested by your health care provider. Follow your health care provider's instructions on when and how to use heat therapy. °GENERAL HEAT THERAPY RECOMMENDATIONS °· Do not sleep while using heat therapy. Only use heat therapy while you are awake. °· Your skin may turn pink while using heat therapy. Do not use heat therapy if your skin turns red. °· Do not use heat therapy if you have new pain. °· High heat or long exposure to heat can cause burns. Be careful when using heat therapy to avoid burning your skin. °· Do not use heat therapy on areas of your skin that are already irritated, such as with a rash or sunburn. °SEEK MEDICAL CARE IF: °· You have blisters, redness, swelling, or numbness. °· You have new pain. °· Your pain is worse. °MAKE SURE  YOU: °· Understand these instructions. °· Will watch your condition. °· Will get help right away if you are not doing well or get worse. °Document Released: 06/15/2011 Document Revised: 08/07/2013 Document Reviewed: 05/16/2013 °[employer]® Patient Information ©2015 [employer]. This information is not intended to replace advice given to you by your health care provider. Make sure you discuss any questions you have with your health care provider. ° °

## 2014-11-06 NOTE — ED Provider Notes (Signed)
CSN: [id number]     Arrival date & time 11/06/14  1657 History   First MD Initiated Contact with Patient 11/06/14 0406     Chief Complaint  Patient presents with  . Neck Pain  . Otalgia     (Consider location/radiation/quality/duration/timing/severity/associated sxs/prior Treatment) HPI 34 year old female presents to emergency department with continued left neck and ear pain.  Patient reports symptoms started roughly a week ago.  She was seen at the emergency department on the 27th with complaint of nasal congestion and ear pain with neck pain.  At that time she had signs and symptoms of a URI.  Patient reports that her URI symptoms have resolved, but neck pain has continued.  She has been taking ibuprofen and doing warm heat packs without improvement.  She denies any fever or chills, no nuchal rigidity. Past Medical History  Diagnosis Date  . Allergy   . Anemia   . Recurrent boils   . Nearsightedness     wears glasses  . Chronic headache   . Arrhythmia   . WPW (Wolff-Parkinson-White syndrome)   . Obesity    Past Surgical History  Procedure Laterality Date  . Cystectomy      tonsils   Family History  Problem Relation Age of Onset  . Breast cancer Mother   . Pulmonary embolism Mother     died of PE  . Diabetes Paternal Grandmother   . Heart disease Neg Hx   . Stroke Neg Hx   . Colon cancer Mother   . Irritable bowel syndrome Mother    History  Substance Use Topics  . Smoking status: Former Smoker -- .5 years    Types: Cigarettes  . Smokeless tobacco: Never Used     Comment: smokes black and milds  . Alcohol Use: No   OB History    Gravida Para Term Preterm AB TAB SAB Ectopic Multiple Living   0              Review of Systems   See History of Present Illness; otherwise all other systems are reviewed and negative  Allergies  Other; Penicillins; and Shrimp  Home Medications   Prior to Admission medications   Medication Sig Start Date End Date Taking?  Authorizing Provider  ferrous sulfate 325 (65 FE) MG tablet Take 1 tablet (325 mg total) by mouth daily with breakfast. 08/16/13   Junius Creamer, NP  methocarbamol (ROBAXIN-750) 750 MG tablet Take 1 tablet (750 mg total) by mouth every 8 (eight) hours as needed for muscle spasms. 11/06/14   Linton Flemings, MD  metoprolol tartrate (LOPRESSOR) 25 MG tablet Take 1 tablet (25 mg total) by mouth 2 (two) times daily. Patient not taking: Reported on 09/12/2014 08/22/13   Evans Lance, MD  naproxen (NAPROSYN) 250 MG tablet Take 1 tablet (250 mg total) by mouth 2 (two) times daily with a meal. 11/06/14   Linton Flemings, MD  traMADol (ULTRAM) 50 MG tablet Take 1 tablet (50 mg total) by mouth every 6 (six) hours as needed. 11/06/14   Linton Flemings, MD   BP 103/57 mmHg  Pulse 84  Temp(Src) 98.4 F (36.9 C) (Oral)  Resp 17  Ht 5\' 8"  (1.727 m)  Wt 268 lb (121.564 kg)  BMI 40.76 kg/m2  SpO2 100%  LMP 10/31/2014 Physical Exam  Constitutional: She is oriented to person, place, and time. She appears well-developed and well-nourished.  HENT:  Head: Normocephalic and atraumatic.  Right Ear: External ear normal.  Left Ear:  External ear normal.  Nose: Nose normal.  Mouth/Throat: Oropharynx is clear and moist.  Eyes: Conjunctivae and EOM are normal. Pupils are equal, round, and reactive to light.  Neck: Normal range of motion. Neck supple. No JVD present. No tracheal deviation present. No thyromegaly present.  Patient is tender along left SCM and paraspinal muscles on the left  Cardiovascular: Normal rate, regular rhythm, normal heart sounds and intact distal pulses.  Exam reveals no gallop and no friction rub.   No murmur heard. Pulmonary/Chest: Effort normal and breath sounds normal. No stridor. No respiratory distress. She has no wheezes. She has no rales. She exhibits no tenderness.  Abdominal: Soft. Bowel sounds are normal. She exhibits no distension and no mass. There is no tenderness. There is no rebound and no guarding.   Musculoskeletal: Normal range of motion. She exhibits no edema or tenderness.  Lymphadenopathy:    She has no cervical adenopathy.  Neurological: She is alert and oriented to person, place, and time. She displays normal reflexes. She exhibits normal muscle tone. Coordination normal.  Skin: Skin is warm and dry. No rash noted. No erythema. No pallor.  Psychiatric: She has a normal mood and affect. Her behavior is normal. Judgment and thought content normal.  Nursing note and vitals reviewed.   ED Course  Procedures (including critical care time) Labs Review Labs Reviewed - No data to display  Imaging Review No results found.   EKG Interpretation None      MDM   Final diagnoses:  Muscle spasms of neck    With tenderness to palpation along left neck muscles with spasm.  Plan for continued warm moist heat, massage therapy, Robaxin, Naprosyn.  No signs of meningitis, serious bacterial infection, or other life-threatening condition    Linton Flemings, MD 11/06/14 [phone]

## 2014-11-06 NOTE — ED Notes (Signed)
Pt states that last week she went to [place] to be checked out for pressure in left ear and side of neck, states was diagnosed with URI. Pt has unrelieved pain, but did not get prescriptions for pain medications filled.

## 2014-11-07 ENCOUNTER — Encounter (HOSPITAL_COMMUNITY): Payer: Self-pay | Admitting: *Deleted

## 2014-11-07 ENCOUNTER — Emergency Department (HOSPITAL_COMMUNITY)
Admission: EM | Admit: 2014-11-07 | Discharge: 2014-11-07 | Disposition: A | Discharge status: Home / Self Care | Payer: Self-pay | Attending: Emergency Medicine | Admitting: Emergency Medicine

## 2014-11-07 ENCOUNTER — Emergency Department (HOSPITAL_COMMUNITY): Payer: Self-pay

## 2014-11-07 DIAGNOSIS — E669 Obesity, unspecified: Secondary | ICD-10-CM | POA: Insufficient documentation

## 2014-11-07 DIAGNOSIS — G8929 Other chronic pain: Secondary | ICD-10-CM | POA: Insufficient documentation

## 2014-11-07 DIAGNOSIS — Z79899 Other long term (current) drug therapy: Secondary | ICD-10-CM | POA: Insufficient documentation

## 2014-11-07 DIAGNOSIS — M542 Cervicalgia: Secondary | ICD-10-CM | POA: Insufficient documentation

## 2014-11-07 DIAGNOSIS — R110 Nausea: Secondary | ICD-10-CM | POA: Insufficient documentation

## 2014-11-07 DIAGNOSIS — Z87891 Personal history of nicotine dependence: Secondary | ICD-10-CM | POA: Insufficient documentation

## 2014-11-07 DIAGNOSIS — M62838 Other muscle spasm: Secondary | ICD-10-CM | POA: Insufficient documentation

## 2014-11-07 DIAGNOSIS — Z3202 Encounter for pregnancy test, result negative: Secondary | ICD-10-CM | POA: Insufficient documentation

## 2014-11-07 DIAGNOSIS — Z880 Allergy status to penicillin: Secondary | ICD-10-CM | POA: Insufficient documentation

## 2014-11-07 DIAGNOSIS — G44209 Tension-type headache, unspecified, not intractable: Secondary | ICD-10-CM | POA: Insufficient documentation

## 2014-11-07 DIAGNOSIS — D649 Anemia, unspecified: Secondary | ICD-10-CM | POA: Insufficient documentation

## 2014-11-07 LAB — POC URINE PREG, ED: PREG TEST UR: NEGATIVE

## 2014-11-07 MED ORDER — PROCHLORPERAZINE MALEATE 10 MG PO TABS
10.0000 mg | ORAL_TABLET | Freq: Once | ORAL | Status: AC
  Administered 2014-11-07: 10 mg via ORAL
  Filled 2014-11-07: qty 1

## 2014-11-07 MED ORDER — DIAZEPAM 5 MG PO TABS
5.0000 mg | ORAL_TABLET | Freq: Once | ORAL | Status: AC
  Administered 2014-11-07: 5 mg via ORAL
  Filled 2014-11-07: qty 1

## 2014-11-07 MED ORDER — DIAZEPAM 5 MG PO TABS: 5.0000 mg | ORAL_TABLET | Freq: Two times a day (BID) | ORAL | Status: DC | PRN

## 2014-11-07 NOTE — ED Notes (Signed)
Pt is requesting a ct scan due to being here multiple times for same complaint. Dr. Ree Kida.

## 2014-11-07 NOTE — ED Notes (Signed)
Pt stable, ambulatory, states understanding of discharge instructions 

## 2014-11-07 NOTE — ED Notes (Signed)
No pain unless she moves

## 2014-11-07 NOTE — ED Notes (Signed)
The pt is c/o neck and head pain for one week.  She has been seen here x 2 for the same no better.  lmp last week

## 2014-11-07 NOTE — ED Provider Notes (Signed)
CSN: [id number]     Arrival date & time 11/07/14  2014 History   First MD Initiated Contact with Patient 11/07/14 2144     Chief Complaint  Patient presents with  . Headache     (Consider location/radiation/quality/duration/timing/severity/associated sxs/prior Treatment) Patient is a 34 y.o. female presenting with headaches.  Headache Location: left neck pain radiating occipitally. Quality: shooting. Radiates to:  Does not radiate Severity currently:  9/10 Onset quality:  Gradual Duration:  2 weeks Timing:  Intermittent Progression:  Waxing and waning Chronicity:  New Similar to prior headaches: no   Relieved by:  Nothing Worsened by:  Nothing Ineffective treatments: naproxen, flexeril helps sleep then wakes again. Associated symptoms: nausea, neck pain and visual change   Associated symptoms: no abdominal pain, no back pain, no cough, no fever, no sore throat, no syncope, no vomiting and no weakness (generalized)     Past Medical History  Diagnosis Date  . Allergy   . Anemia   . Recurrent boils   . Nearsightedness     wears glasses  . Chronic headache   . Arrhythmia   . WPW (Wolff-Parkinson-White syndrome)   . Obesity    Past Surgical History  Procedure Laterality Date  . Cystectomy      tonsils   Family History  Problem Relation Age of Onset  . Breast cancer Mother   . Pulmonary embolism Mother     died of PE  . Diabetes Paternal Grandmother   . Heart disease Neg Hx   . Stroke Neg Hx   . Colon cancer Mother   . Irritable bowel syndrome Mother    History  Substance Use Topics  . Smoking status: Former Smoker -- .5 years    Types: Cigarettes  . Smokeless tobacco: Never Used     Comment: smokes black and milds  . Alcohol Use: No   OB History    Gravida Para Term Preterm AB TAB SAB Ectopic Multiple Living   0              Review of Systems  Constitutional: Negative for fever.  HENT: Negative for sore throat.   Eyes: Negative for visual  disturbance.  Respiratory: Negative for cough and shortness of breath.   Cardiovascular: Negative for chest pain and syncope.  Gastrointestinal: Positive for nausea. Negative for vomiting and abdominal pain.  Genitourinary: Negative for difficulty urinating.  Musculoskeletal: Positive for neck pain. Negative for back pain.  Skin: Negative for rash.  Neurological: Positive for headaches. Negative for syncope and weakness (generalized).      Allergies  Other; Penicillins; and Shrimp  Home Medications   Prior to Admission medications   Medication Sig Start Date End Date Taking? Authorizing Provider  methocarbamol (ROBAXIN-750) 750 MG tablet Take 1 tablet (750 mg total) by mouth every 8 (eight) hours as needed for muscle spasms. 11/06/14  Yes Linton Flemings, MD  naproxen (NAPROSYN) 250 MG tablet Take 1 tablet (250 mg total) by mouth 2 (two) times daily with a meal. 11/06/14  Yes Linton Flemings, MD  traMADol (ULTRAM) 50 MG tablet Take 1 tablet (50 mg total) by mouth every 6 (six) hours as needed. 11/06/14  Yes Linton Flemings, MD  diazepam (VALIUM) 5 MG tablet Take 1 tablet (5 mg total) by mouth every 12 (twelve) hours as needed for muscle spasms. 11/07/14   Gareth Morgan, MD  ferrous sulfate 325 (65 FE) MG tablet Take 1 tablet (325 mg total) by mouth daily with breakfast. 08/16/13  Junius Creamer, NP  metoprolol tartrate (LOPRESSOR) 25 MG tablet Take 1 tablet (25 mg total) by mouth 2 (two) times daily. Patient not taking: Reported on 09/12/2014 08/22/13   Evans Lance, MD   BP 108/64 mmHg  Pulse 89  Temp(Src) 98.4 F (36.9 C)  Resp 18  Ht 5\' 9"  (1.753 m)  Wt 267 lb 2 oz (121.167 kg)  BMI 39.43 kg/m2  SpO2 99%  LMP 10/31/2014 Physical Exam  Constitutional: She is oriented to person, place, and time. She appears well-developed and well-nourished. No distress.  HENT:  Head: Normocephalic and atraumatic.  Eyes: Conjunctivae and EOM are normal.  Neck: Normal range of motion. Muscular tenderness present.   Cardiovascular: Normal rate, regular rhythm, normal heart sounds and intact distal pulses.  Exam reveals no gallop and no friction rub.   No murmur heard. Pulmonary/Chest: Effort normal and breath sounds normal. No respiratory distress. She has no wheezes. She has no rales.  Abdominal: Soft. She exhibits no distension. There is no tenderness. There is no guarding.  Musculoskeletal: She exhibits no edema or tenderness.  Neurological: She is alert and oriented to person, place, and time. She has normal strength. She displays no tremor. No cranial nerve deficit or sensory deficit. Coordination and gait normal. GCS eye subscore is 4. GCS verbal subscore is 5. GCS motor subscore is 6.  Skin: Skin is warm and dry. No rash noted. She is not diaphoretic. No erythema.  Nursing note and vitals reviewed.   ED Course  Procedures (including critical care time) Labs Review Labs Reviewed  POC URINE PREG, ED    Imaging Review Ct Head Wo Contrast  11/07/2014   CLINICAL DATA:  Patient with sharp pain to the back of the head and bilaterally for years. Blurry vision.  EXAM: CT HEAD WITHOUT CONTRAST  TECHNIQUE: Contiguous axial images were obtained from the base of the skull through the vertex without intravenous contrast.  COMPARISON:  None.  FINDINGS: Ventricles and sulci are appropriate for patient's age. No evidence for acute cortically based infarct, intracranial hemorrhage, mass lesion or mass-effect. Cavum septum pellucidum. Orbits are unremarkable. Paranasal sinuses are unremarkable. Mastoid air cells are well aerated. Calvarium is intact.  IMPRESSION: No acute intracranial process.   Electronically Signed   By: Lovey Newcomer M.D.   On: 11/07/2014 23:17     EKG Interpretation None      MDM   Final diagnoses:  Tension-type headache, not intractable, unspecified chronicity pattern  Muscle spasm    33yo female with no significant medical history presents with concern of headache and neck pain for 2  weeks.  This is her 3rd visit for similar.  Headache began slowly, no trauma, no fevers, and normal neurologic exam and have low suspicion for [facility], SDH or meningitis.  Given ha with nausea, multiple ED visits for HA of 2 weeks ordered CT head to screen for other intracranial abnormality and CT was normal.  No trauma to suggest cervical spine fx.  No other trauma/sudden movements, no neurologic symptoms and doubt vertebral dissection.  Patient again most likely with cervical muscle spasm with extension occipitally, especially given tenderness on exam, severe pain with movement.  Given compazine for nausea/HA and valium for muscle spasm. Pt reports greater muscle spasm relief with valium than what she has had at home with flexeril and wrote rx for valium (to take in place of flexeril), and discussed continuing naproxen/heat, and begin stretching exercises as able.  Patient discharged in stable condition with  understanding of reasons to return.      Gareth Morgan, MD 11/08/14 0020

## 2014-11-07 NOTE — Discharge Instructions (Signed)

## 2014-11-19 ENCOUNTER — Ambulatory Visit (INDEPENDENT_AMBULATORY_CARE_PROVIDER_SITE_OTHER): Payer: Self-pay | Admitting: Family Medicine

## 2014-11-19 VITALS — BP 110/64 | HR 87 | Temp 98.3°F | Resp 16 | Ht 69.0 in | Wt 268.0 lb

## 2014-11-19 DIAGNOSIS — S161XXA Strain of muscle, fascia and tendon at neck level, initial encounter: Secondary | ICD-10-CM

## 2014-11-19 MED ORDER — TRAMADOL HCL 50 MG PO TABS
50.0000 mg | ORAL_TABLET | Freq: Four times a day (QID) | ORAL | Status: DC | PRN
Start: 2014-11-19 — End: 2015-03-29

## 2014-11-19 NOTE — Patient Instructions (Signed)
Sleep with neck in good alignment with body. Wear neck brace as needed in desired. Heat to neck several times a day. Once you get your financial aid, we can refer for PT, if not better.

## 2014-11-19 NOTE — Progress Notes (Signed)
Patient ID: Amanda Davenport, female   DOB: [birth year], 34 y.o.   MRN: [id number]   Amanda Davenport, is a 34 y.o. female  PPI:[id number]  AYT:[id number]  DOB - 26-Mar-1981  CC:  Chief Complaint  Patient presents with  . Establish Care    neck pain        HPI: Amanda Davenport is a 34 y.o. female here to establish care and wtith concern about neck pain.She has recently been seen in ED and is being treated with naproxen and robaxin. She is not receiving significant relief. She has a history of WPW and sees a cardiologist. She has a history of anemia and headaches and recurrent boils. Allergies  Allergen Reactions  . Other Other (See Comments)    All Antibiotics cause severe vaginal yeast infections  . Penicillins Hives, Itching and Swelling  . Shrimp [Shellfish Allergy] Hives   Past Medical History  Diagnosis Date  . Allergy   . Anemia   . Recurrent boils   . Nearsightedness     wears glasses  . Chronic headache   . Arrhythmia   . WPW (Wolff-Parkinson-White syndrome)   . Obesity    Current Outpatient Prescriptions on File Prior to Visit  Medication Sig Dispense Refill  . diazepam (VALIUM) 5 MG tablet Take 1 tablet (5 mg total) by mouth every 12 (twelve) hours as needed for muscle spasms. 10 tablet 0  . ferrous sulfate 325 (65 FE) MG tablet Take 1 tablet (325 mg total) by mouth daily with breakfast. 1 tablet 3  . methocarbamol (ROBAXIN-750) 750 MG tablet Take 1 tablet (750 mg total) by mouth every 8 (eight) hours as needed for muscle spasms. 40 tablet 0  . metoprolol tartrate (LOPRESSOR) 25 MG tablet Take 1 tablet (25 mg total) by mouth 2 (two) times daily. (Patient not taking: Reported on 09/12/2014) 180 tablet 3  . naproxen (NAPROSYN) 250 MG tablet Take 1 tablet (250 mg total) by mouth 2 (two) times daily with a meal. 30 tablet 0  . traMADol (ULTRAM) 50 MG tablet Take 1 tablet (50 mg total) by mouth every 6 (six) hours as needed. 15 tablet 0  . [DISCONTINUED] cetirizine (ZYRTEC  ALLERGY) 10 MG tablet Take 1 tablet (10 mg total) by mouth daily. 30 tablet 1  . [DISCONTINUED] diphenhydramine-acetaminophen (TYLENOL PM) 25-500 MG TABS Take 2 tablets by mouth at bedtime as needed (sleep).     . [DISCONTINUED] fluticasone (FLONASE) 50 MCG/ACT nasal spray Place 2 sprays into both nostrils daily. 16 g 1  . [DISCONTINUED] mupirocin nasal ointment (BACTROBAN) 2 % Apply to affected area BID x 7 days (Patient not taking: Reported on 09/12/2014) 1 g 0   No current facility-administered medications on file prior to visit.   Family History  Problem Relation Age of Onset  . Breast cancer Mother   . Pulmonary embolism Mother     died of PE  . Diabetes Paternal Grandmother   . Heart disease Neg Hx   . Stroke Neg Hx   . Colon cancer Mother   . Irritable bowel syndrome Mother    Social History   Social History  . Marital Status: Single    Spouse Name: N/A  . Number of Children: N/A  . Years of Education: N/A   Occupational History  . Not on file.   Social History Main Topics  . Smoking status: Former Smoker -- .5 years    Types: Cigarettes  . Smokeless tobacco: Never Used     Comment:  smokes black and milds  . Alcohol Use: No  . Drug Use: No  . Sexual Activity: Not on file   Other Topics Concern  . Not on file   Social History Narrative    Review of Systems: Constitutional: Negative for fever, chills, appetite change, weight loss. Positive for fatigue.  HENT: Negative for ear pain, ear discharge.nose bleeds Eyes: Negative for pain, discharge, redness, itching and visual disturbance. Neck: Positivefor pain, stiffness Respiratory: Negative for cough, shortness of breath,   Cardiovascular: Negative for chest pain, palpitations and leg swelling.Positve for WPW Gastrointestinal: Negative for abdominal distention, abdominal pain, nausea, vomiting, diarrhea, constipations Genitourinary: Negative for dysuria, urgency, frequency, hematuria, flank pain,  Musculoskeletal:  Negative for back pain, joint pain, joint  swelling, arthralgia and gait problem.Negative for weakness. Neurological: Negative for tremors, seizures, syncope,   light-headedness, numbness and headaches. Positive for occassional dizziness. Hematological: Negative for easy bruising or bleeding Psychiatric/Behavioral: Negative for anxiety, decreased concentration, confusion. Positive for depression Skin: Positive for recurrent boils.     Objective:   Filed Vitals:   11/19/14 1313  BP: 110/64  Pulse: 87  Temp: 98.3 F (36.8 C)  Resp: 16    Physical Exam: Constitutional: Patient appears well-developed and well-nourished. No distress. HENT: Normocephalic, atraumatic, External right and left ear normal. Oropharynx is clear and moist.  Eyes: Conjunctivae and EOM are normal. PERRLA, no scleral icterus. Neck:  No lymphadenopathy, No thyromegaly.Postive for stiffness, decreased ROM and  tendeness along the back of the neck and the upper left shoulder. CVS: RRR, S1/S2 +, no murmurs, no gallops, no rubs Pulmonary: Effort and breath sounds normal, no stridor, rhonchi, wheezes, rales.  Abdominal: Soft. Normoactive BS,, no distension, tenderness, rebound or guarding.  Musculoskeletal: Normal range of motion. No edema and no tenderness.  Neuro: Alert.Normal muscle tone coordination. Non-focal Skin: Skin is warm and dry. No rash noted. Not diaphoretic. No erythema. No pallor. Psychiatric: Normal mood and affect. Behavior, judgment, thought content normal.  Lab Results  Component Value Date   WBC 9.9 08/16/2013   HGB 8.9* 08/16/2013   HCT 28.5* 08/16/2013   MCV 72.3* 08/16/2013   PLT 533* 08/16/2013   Lab Results  Component Value Date   CREATININE 0.67 08/16/2013   BUN 9 08/16/2013   NA 138 08/16/2013   K 3.3* 08/16/2013   CL 103 08/16/2013   CO2 21 08/16/2013    Lab Results  Component Value Date   HGBA1C 5.3 08/18/2011   Lipid Panel  No results found for: CHOL, TRIG, HDL,  CHOLHDL, VLDL, LDLCALC     Assessment and plan:   Establish care -I have reveiwed available information provided by the patient and found in the chart  -Neck strain/strain/pain -Continue treatment from ED -Toredol 50 mg# #30, one po q 8 hours prn. -Prescription for neck brace to be used as needed and desired, particularly at night. -Discussion of appropriate sleeping position  Three months and prn.  The patient was given clear instructions to go to ER or return to medical center if symptoms don't improve, worsen or new problems develop. The patient verbalized understanding. The patient was told to call to get lab results if they haven't heard anything in the next week.       Micheline Chapman, MSN, FNP-BC   11/19/2014, 1:24 PM

## 2014-11-20 ENCOUNTER — Encounter: Payer: Self-pay | Admitting: Family Medicine

## 2014-11-22 ENCOUNTER — Encounter (HOSPITAL_COMMUNITY): Payer: Self-pay

## 2014-11-22 ENCOUNTER — Emergency Department (HOSPITAL_COMMUNITY)
Admission: EM | Admit: 2014-11-22 | Discharge: 2014-11-22 | Disposition: A | Discharge status: Home / Self Care | Payer: Self-pay | Attending: Emergency Medicine | Admitting: Emergency Medicine

## 2014-11-22 ENCOUNTER — Ambulatory Visit (INDEPENDENT_AMBULATORY_CARE_PROVIDER_SITE_OTHER): Payer: Self-pay | Admitting: Family Medicine

## 2014-11-22 VITALS — BP 122/70 | HR 101 | Temp 98.3°F | Resp 16 | Ht 69.0 in | Wt 265.0 lb

## 2014-11-22 DIAGNOSIS — I499 Cardiac arrhythmia, unspecified: Secondary | ICD-10-CM | POA: Insufficient documentation

## 2014-11-22 DIAGNOSIS — Z791 Long term (current) use of non-steroidal anti-inflammatories (NSAID): Secondary | ICD-10-CM | POA: Insufficient documentation

## 2014-11-22 DIAGNOSIS — R51 Headache: Secondary | ICD-10-CM | POA: Insufficient documentation

## 2014-11-22 DIAGNOSIS — M609 Myositis, unspecified: Secondary | ICD-10-CM

## 2014-11-22 DIAGNOSIS — Z87891 Personal history of nicotine dependence: Secondary | ICD-10-CM | POA: Insufficient documentation

## 2014-11-22 DIAGNOSIS — M62838 Other muscle spasm: Secondary | ICD-10-CM | POA: Insufficient documentation

## 2014-11-22 DIAGNOSIS — H538 Other visual disturbances: Secondary | ICD-10-CM | POA: Insufficient documentation

## 2014-11-22 DIAGNOSIS — M542 Cervicalgia: Secondary | ICD-10-CM | POA: Insufficient documentation

## 2014-11-22 DIAGNOSIS — Z79899 Other long term (current) drug therapy: Secondary | ICD-10-CM | POA: Insufficient documentation

## 2014-11-22 DIAGNOSIS — G8929 Other chronic pain: Secondary | ICD-10-CM | POA: Insufficient documentation

## 2014-11-22 DIAGNOSIS — D649 Anemia, unspecified: Secondary | ICD-10-CM | POA: Insufficient documentation

## 2014-11-22 DIAGNOSIS — M791 Myalgia: Secondary | ICD-10-CM

## 2014-11-22 DIAGNOSIS — Z880 Allergy status to penicillin: Secondary | ICD-10-CM | POA: Insufficient documentation

## 2014-11-22 DIAGNOSIS — E669 Obesity, unspecified: Secondary | ICD-10-CM | POA: Insufficient documentation

## 2014-11-22 DIAGNOSIS — IMO0001 Reserved for inherently not codable concepts without codable children: Secondary | ICD-10-CM

## 2014-11-22 DIAGNOSIS — Z872 Personal history of diseases of the skin and subcutaneous tissue: Secondary | ICD-10-CM | POA: Insufficient documentation

## 2014-11-22 DIAGNOSIS — H53149 Visual discomfort, unspecified: Secondary | ICD-10-CM | POA: Insufficient documentation

## 2014-11-22 DIAGNOSIS — G44209 Tension-type headache, unspecified, not intractable: Secondary | ICD-10-CM

## 2014-11-22 MED ORDER — IBUPROFEN 800 MG PO TABS: 800.0000 mg | ORAL_TABLET | Freq: Three times a day (TID) | ORAL | Status: DC

## 2014-11-22 MED ORDER — HYDROCODONE-ACETAMINOPHEN 5-325 MG PO TABS
1.0000 | ORAL_TABLET | Freq: Once | ORAL | Status: AC
Start: 2014-11-22 — End: 2014-11-22
  Administered 2014-11-22: 1 via ORAL
  Filled 2014-11-22: qty 1

## 2014-11-22 MED ORDER — METHOCARBAMOL 500 MG PO TABS
500.0000 mg | ORAL_TABLET | Freq: Once | ORAL | Status: AC
  Administered 2014-11-22: 500 mg via ORAL
  Filled 2014-11-22: qty 1

## 2014-11-22 MED ORDER — METHOCARBAMOL 500 MG PO TABS: 500.0000 mg | ORAL_TABLET | Freq: Two times a day (BID) | ORAL | Status: DC

## 2014-11-22 NOTE — ED Provider Notes (Signed)
CSN: [id number]     Arrival date & time 11/22/14  1103 History   First MD Initiated Contact with Patient 11/22/14 1222     Chief Complaint  Patient presents with  . Neck Pain     (Consider location/radiation/quality/duration/timing/severity/associated sxs/prior Treatment) Patient is a 34 y.o. female presenting with neck pain. The history is provided by the patient and medical records. No language interpreter was used.  Neck Pain Associated symptoms: headaches and photophobia   Associated symptoms: no chest pain and no fever      Amanda Davenport is a 34 y.o. female  with a hx of anemia, allergy, recurrent boils, chronic headache, Wolff-Parkinson-White syndrome, obesity presents to the Emergency Department complaining of gradual, persistent, progressively worsening generalized throbbing headache worse in the posterior and bilateral neck pain onset approximately 4 weeks ago. Patient reports that she does have mild photophobia and blurred vision.  She denies diplopia reports these symptoms are unchanged for the last 4 weeks..   she reports her headache is a 6/10. She denies weakness, numbness, tingling. Nursing note reports that patient states she becomes dizzy when standing however she denies this to me.  Patient reports she was referred to a primary care physician at [facility]. She saw this primary care physician this morning who initially stated that they could not do anything for her but then agreed to run lab work including [place]. Patient reports she left before blood was drawn because they were not doing anything for her.   Patient denies fever, chills, nausea, vomiting, rash, known tick bites.  Record review shows the patient has been seen multiple times here in the emergency department for this. She has been given to inflammatory's and muscle relaxers. She has been given a soft neck collar. She had a CT scan on 11/08/2014 which was unremarkable.     Past Medical History   Diagnosis Date  . Allergy   . Anemia   . Recurrent boils   . Nearsightedness     wears glasses  . Chronic headache   . Arrhythmia   . WPW (Wolff-Parkinson-White syndrome)   . Obesity    Past Surgical History  Procedure Laterality Date  . Cystectomy      tonsils   Family History  Problem Relation Age of Onset  . Breast cancer Mother   . Pulmonary embolism Mother     died of PE  . Diabetes Paternal Grandmother   . Heart disease Neg Hx   . Stroke Neg Hx   . Colon cancer Mother   . Irritable bowel syndrome Mother    Social History  Substance Use Topics  . Smoking status: Former Smoker -- .5 years    Types: Cigarettes  . Smokeless tobacco: Never Used     Comment: smokes black and milds  . Alcohol Use: No   OB History    Gravida Para Term Preterm AB TAB SAB Ectopic Multiple Living   0              Review of Systems  Constitutional: Negative for fever, diaphoresis, appetite change, fatigue and unexpected weight change.  HENT: Negative for mouth sores.   Eyes: Positive for photophobia and visual disturbance.  Respiratory: Negative for cough, chest tightness, shortness of breath and wheezing.   Cardiovascular: Negative for chest pain.  Gastrointestinal: Negative for nausea, vomiting, abdominal pain, diarrhea and constipation.  Endocrine: Negative for polydipsia, polyphagia and polyuria.  Genitourinary: Negative for dysuria, urgency, frequency and hematuria.  Musculoskeletal: Positive for neck pain. Negative for back pain and neck stiffness.  Skin: Negative for rash.  Allergic/Immunologic: Negative for immunocompromised state.  Neurological: Positive for headaches. Negative for syncope and light-headedness.  Hematological: Does not bruise/bleed easily.  Psychiatric/Behavioral: Negative for sleep disturbance. The patient is not nervous/anxious.       Allergies  Other; Penicillins; and Shrimp  Home Medications   Prior to Admission medications   Medication Sig  Start Date End Date Taking? Authorizing Provider  diazepam (VALIUM) 5 MG tablet Take 1 tablet (5 mg total) by mouth every 12 (twelve) hours as needed for muscle spasms. 11/07/14   Gareth Morgan, MD  ferrous sulfate 325 (65 FE) MG tablet Take 1 tablet (325 mg total) by mouth daily with breakfast. 08/16/13   Junius Creamer, NP  ibuprofen (ADVIL,MOTRIN) 800 MG tablet Take 1 tablet (800 mg total) by mouth 3 (three) times daily. 11/22/14   Rhetta Cleek, PA-C  methocarbamol (ROBAXIN) 500 MG tablet Take 1 tablet (500 mg total) by mouth 2 (two) times daily. 11/22/14   Holli Rengel, PA-C  metoprolol tartrate (LOPRESSOR) 25 MG tablet Take 1 tablet (25 mg total) by mouth 2 (two) times daily. Patient not taking: Reported on 09/12/2014 08/22/13   Evans Lance, MD  naproxen (NAPROSYN) 250 MG tablet Take 1 tablet (250 mg total) by mouth 2 (two) times daily with a meal. 11/06/14   Linton Flemings, MD  traMADol (ULTRAM) 50 MG tablet Take 1 tablet (50 mg total) by mouth every 6 (six) hours as needed. 11/19/14   Micheline Chapman, NP   BP 107/69 mmHg  Pulse 80  Temp(Src) 98 F (36.7 C) (Oral)  Resp 18  SpO2 97%  LMP 10/31/2014 Physical Exam  Constitutional: She is oriented to person, place, and time. She appears well-developed and well-nourished. No distress.  HENT:  Head: Normocephalic and atraumatic.  Right Ear: Tympanic membrane, external ear and ear canal normal.  Left Ear: Tympanic membrane, external ear and ear canal normal.  Nose: Nose normal. No epistaxis. Right sinus exhibits no maxillary sinus tenderness and no frontal sinus tenderness. Left sinus exhibits no maxillary sinus tenderness and no frontal sinus tenderness.  Mouth/Throat: Uvula is midline, oropharynx is clear and moist and mucous membranes are normal. Mucous membranes are not pale and not cyanotic. No oropharyngeal exudate, posterior oropharyngeal edema, posterior oropharyngeal erythema or tonsillar abscesses.  Eyes: Conjunctivae and EOM  are normal. Pupils are equal, round, and reactive to light. No scleral icterus.  No horizontal, vertical or rotational nystagmus  Neck: Normal range of motion and full passive range of motion without pain. Neck supple.  Full active and passive ROM without pain No midline tenderness Bilateral paraspinal tenderness extending down to the bilateral trapezius with palpable muscle spasm No nuchal rigidity or meningeal signs  Cardiovascular: Normal rate, regular rhythm, normal heart sounds and intact distal pulses.   No murmur heard. Pulmonary/Chest: Effort normal and breath sounds normal. No stridor. No respiratory distress. She has no wheezes. She has no rales.  Clear and equal breath sounds without focal wheezes, rhonchi, rales  Abdominal: Soft. Bowel sounds are normal. There is no tenderness. There is no rebound and no guarding.  Musculoskeletal: Normal range of motion.  Lymphadenopathy:    She has no cervical adenopathy.  Neurological: She is alert and oriented to person, place, and time. She has normal reflexes. No cranial nerve deficit. She exhibits normal muscle tone. Coordination normal.  Mental Status:  Alert, oriented, thought content appropriate. Speech  fluent without evidence of aphasia. Able to follow 2 step commands without difficulty.  Cranial Nerves:  II:  Peripheral visual fields grossly normal, pupils equal, round, reactive to light III,IV, VI: ptosis not present, extra-ocular motions intact bilaterally  V,VII: smile symmetric, facial light touch sensation equal VIII: hearing grossly normal bilaterally  IX,X: midline uvula rise  XI: bilateral shoulder shrug equal and strong XII: midline tongue extension  Motor:  5/5 in upper and lower extremities bilaterally including strong and equal grip strength and dorsiflexion/plantar flexion Sensory: Pinprick and light touch normal in all extremities.  Deep Tendon Reflexes: 2+ and symmetric  Cerebellar: normal finger-to-nose with  bilateral upper extremities Gait: normal gait and balance CV: distal pulses palpable throughout   Skin: Skin is warm and dry. No rash noted. She is not diaphoretic.  Psychiatric: She has a normal mood and affect. Her behavior is normal. Judgment and thought content normal.  Nursing note and vitals reviewed.   ED Course  Procedures (including critical care time) Labs Review Labs Reviewed - No data to display  Imaging Review No results found. I have personally reviewed and evaluated these images and lab results as part of my medical decision-making.   EKG Interpretation None      MDM   Final diagnoses:  Neck pain  Tension-type headache, not intractable, unspecified chronicity pattern  Trapezius muscle spasm    Amanda Davenport presents with bilateral paraspinal neck pain and subsequent headache. He should has had these symptoms for several weeks and has been evaluated multiple times in the emergency department. She recently had a CT of her head which was normal. She has no midline tenderness and no trauma. No fevers or chills to suggest meningitis. No petechiae or purpura noted.  Normal neurologic exam here.  Discussed with patient acute treatments and potential long-term evaluation and treatment by a primary care physician or orthopedist for further evaluation and treatment. This time and do not feel that further imaging or lab work will be valuable to the patient's encounter here in the emergency department. She was given ibuprofen and Robaxin with improvement in her neck pain and improvement in headache. Patient given resource guide to obtain new primary care provider.  BP 107/69 mmHg  Pulse 80  Temp(Src) 98 F (36.7 C) (Oral)  Resp 18  SpO2 97%  LMP 10/31/2014     Abigail Butts, PA-C 11/22/14 [place], MD 11/24/14 [phone]

## 2014-11-22 NOTE — ED Notes (Signed)
Patient states no n/v.  Lights bothers her eyes.  She becomes dizzy when standing up but states it could be related to medication.  No LOC and hasn' t hit her head.

## 2014-11-22 NOTE — Discharge Instructions (Signed)
1. Medications: robaxin, ibuprofen, usual home medications 2. Treatment: rest, drink plenty of fluids, gentle stretching as discussed, alternate ice and heat 3. Follow Up: Please followup with your primary doctor in 3 days for discussion of your diagnoses and further evaluation after today's visit; if you do not have a primary care doctor use the resource guide provided to find one;  Return to the ER for worsening back pain, difficulty walking, loss of bowel or bladder control or other concerning symptoms   Tension Headache A tension headache is a feeling of pain, pressure, or aching often felt over the front and sides of the head. The pain can be dull or can feel tight (constricting). It is the most common type of headache. Tension headaches are not normally associated with nausea or vomiting and do not get worse with physical activity. Tension headaches can last 30 minutes to several days.  CAUSES  The exact cause is not known, but it may be caused by chemicals and hormones in the brain that lead to pain. Tension headaches often begin after stress, anxiety, or depression. Other triggers may include:  Alcohol.  Caffeine (too much or withdrawal).  Respiratory infections (colds, flu, sinus infections).  Dental problems or teeth clenching.  Fatigue.  Holding your head and neck in one position too long while using a computer. SYMPTOMS   Pressure around the head.   Dull, aching head pain.   Pain felt over the front and sides of the head.   Tenderness in the muscles of the head, neck, and shoulders. DIAGNOSIS  A tension headache is often diagnosed based on:   Symptoms.   Physical examination.   A CT scan or MRI of your head. These tests may be ordered if symptoms are severe or unusual. TREATMENT  Medicines may be given to help relieve symptoms.  HOME CARE INSTRUCTIONS   Only take over-the-counter or prescription medicines for pain or discomfort as directed by your caregiver.    Lie down in a dark, quiet room when you have a headache.   Keep a journal to find out what may be triggering your headaches. For example, write down:  What you eat and drink.  How much sleep you get.  Any change to your diet or medicines.  Try massage or other relaxation techniques.   Ice packs or heat applied to the head and neck can be used. Use these 3 to 4 times per day for 15 to 20 minutes each time, or as needed.   Limit stress.   Sit up straight, and do not tense your muscles.   Quit smoking if you smoke.  Limit alcohol use.  Decrease the amount of caffeine you drink, or stop drinking caffeine.  Eat and exercise regularly.  Get 7 to 9 hours of sleep, or as recommended by your caregiver.  Avoid excessive use of pain medicine as recurrent headaches can occur.  SEEK MEDICAL CARE IF:   You have problems with the medicines you were prescribed.  Your medicines do not work.  You have a change from the usual headache.  You have nausea or vomiting. SEEK IMMEDIATE MEDICAL CARE IF:   Your headache becomes severe.  You have a fever.  You have a stiff neck.  You have loss of vision.  You have muscular weakness or loss of muscle control.  You lose your balance or have trouble walking.  You feel faint or pass out.  You have severe symptoms that are different from your first symptoms.  MAKE SURE YOU:   Understand these instructions.  Will watch your condition.  Will get help right away if you are not doing well or get worse. Document Released: 03/23/2005 Document Revised: 06/15/2011 Document Reviewed: 03/13/2011 [facility] Patient Information 2015 [place], [place]. This information is not intended to replace advice given to you by your health care provider. Make sure you discuss any questions you have with your health care provider.

## 2014-11-22 NOTE — ED Notes (Signed)
Pt c/o neck pain radiating into head and ears x 1 month.  Pain score 6/10.  Denies injury and numbness/tingling.  Pt has been seen several times in the past for the same.  Pt reports that she was referred to the [facility] "they told me that they couldn't do anything for me."

## 2014-11-22 NOTE — Progress Notes (Signed)
Subjective:     Patient ID: Amanda Davenport, female   DOB: December 22, 1980, 34 y.o.   MRN: [id number]  HPI   Patient presents for a follow-up consultation regarding neck pain. She has been seen in ED and treated with Robaxin, I have prescribed  Tramadol and a neck support for prn use.  She continues to have the pain She also complains of multiple joint pain and request testing for Lupus. No other new symptoms.   Review of Systems   Deferred as this is a visit for consultation     Objective:   Physical Exam   Deferred  Assessment:     Neck and other joint pain -RA panel -C-spine xrays -Once we have these results will consider referral to PT.         After evaluation and ordering labs and x-rays, patient became upset with me and says I am not doing anything to help her and she is leaving. I have explained that there is nothing more I can do to relieve the current pain. That after I have her cervical spine x-rays back, we will attempt a referral to PT. She has an orange card and no ortho referral are available.  She declines further treatment.   Micheline Chapman, FNP-BC

## 2014-11-27 ENCOUNTER — Ambulatory Visit (INDEPENDENT_AMBULATORY_CARE_PROVIDER_SITE_OTHER): Payer: Self-pay | Admitting: Internal Medicine

## 2014-11-27 VITALS — BP 127/72 | HR 99 | Temp 98.0°F | Resp 16 | Ht 69.0 in | Wt 269.0 lb

## 2014-11-27 DIAGNOSIS — IMO0001 Reserved for inherently not codable concepts without codable children: Secondary | ICD-10-CM | POA: Insufficient documentation

## 2014-11-27 DIAGNOSIS — D509 Iron deficiency anemia, unspecified: Secondary | ICD-10-CM

## 2014-11-27 DIAGNOSIS — M791 Myalgia: Secondary | ICD-10-CM

## 2014-11-27 DIAGNOSIS — M436 Torticollis: Secondary | ICD-10-CM | POA: Insufficient documentation

## 2014-11-27 DIAGNOSIS — M609 Myositis, unspecified: Secondary | ICD-10-CM

## 2014-11-27 LAB — CBC WITH DIFFERENTIAL/PLATELET
Basophils Absolute: 0.1 10*3/uL (ref 0.0–0.1)
Basophils Relative: 1 % (ref 0–1)
EOS ABS: 0.2 10*3/uL (ref 0.0–0.7)
EOS PCT: 2 % (ref 0–5)
HEMATOCRIT: 23 % — AB (ref 36.0–46.0)
Hemoglobin: 6.3 g/dL — CL (ref 12.0–15.0)
LYMPHS PCT: 23 % (ref 12–46)
Lymphs Abs: 2 10*3/uL (ref 0.7–4.0)
MCH: 17.1 pg — ABNORMAL LOW (ref 26.0–34.0)
MCHC: 27.4 g/dL — ABNORMAL LOW (ref 30.0–36.0)
MCV: 62.3 fL — AB (ref 78.0–100.0)
MPV: 7.7 fL — AB (ref 8.6–12.4)
Monocytes Absolute: 0.4 10*3/uL (ref 0.1–1.0)
Monocytes Relative: 5 % (ref 3–12)
Neutro Abs: 5.9 10*3/uL (ref 1.7–7.7)
Neutrophils Relative %: 69 % (ref 43–77)
PLATELETS: 623 10*3/uL — AB (ref 150–400)
RBC: 3.69 MIL/uL — AB (ref 3.87–5.11)
RDW: 20.7 % — ABNORMAL HIGH (ref 11.5–15.5)
WBC: 8.6 10*3/uL (ref 4.0–10.5)

## 2014-11-27 NOTE — Patient Instructions (Signed)

## 2014-11-27 NOTE — Progress Notes (Signed)
Patient ID: Amanda Davenport, female   DOB: 12-Jul-1980, 34 y.o.   MRN: [id number]   Amanda Davenport, is a 34 y.o. female  FUX:[id number]  GUR:[id number]  DOB - 1980/05/20  No chief complaint on file.       Subjective:   Amanda Davenport is a 34 y.o. female here today for a follow up visit. Patient has a history of chronic anemia, last hemoglobin concentration was 8.9 over 1 year ago, recurrent boils, chronic headache, chronic neck pain, allergies, WPW syndrome and obesity. Patient was seen here last week for neck strain and some nonspecific body aches, she later went to the ED for complaints of headaches and photophobia, no workup was done because patient had recent CT head which was negative, she was given ibuprofen and Robaxin with improvement in her neck pain and headache and was given instructions to follow-up with primary care physician. Patient is here today for follow-up ED visit. Her complaint is the same neck pain radiating to back of her head, no associated diplopia or blurry vision but patient complained of occasional pain in her wrist joints and a sudden onset of a contracture of her right ring finger that she is not able to extend and she feels pain in the knuckles. She constantly feels something is wrong with her but no one has been able to properly evaluate her. She has had anemia for over a long time, she is taking iron pills occasionally, she was told of high platelet count but has not been evaluated properly. Patient is tired of taking pain medication, just wants to know "what is wrong with me". She does not remember any injury to her neck, no history of fall, but for about 3 weeks to one month she has not been able to move her neck properly without pain and she feels tightness around the neck and shoulder area. She has no fever, no night sweats, no cough, no chest pain, no abdominal pain, no nausea or vomiting. Reviewing patient's record, she has had multiple x-rays of joints following  complaints of joint pains without evidence of injury, most at times x-ray showed soft tissue swelling without significant or acute osseous abnormality. She had also complained of dizziness and chest pressure in the past. But today patient denied dizziness, she denies chest pain, no shortness of breath, no cough. No weakness or tingling in the limbs. There has been no documented reason for her chronic anemia, her last pelvic ultrasound was 2010 which showed normal physiologic findings. No history of excessive blood loss. Patient is single, has no kids, has a job that she doesn't really like but "I like the money". She is present in the clinic today with her father who is supportive. No significant family history of autoimmune disorder like rheumatoid arthritis, lupus but she has an aunt that has fibromyalgia. She does not know the history of her mother's family because she was adopted. She does not smoke cigarettes, she does not drink alcohol. She denies the use of illicit drugs.  Problem  Myalgia and Myositis  Atlantoaxial Torticollis  Morbid Obesity    ALLERGIES: Allergies  Allergen Reactions  . Other Other (See Comments)    All Antibiotics cause severe vaginal yeast infections  . Penicillins Hives, Itching and Swelling  . Shellfish-Derived Products Hives  . Shrimp [Shellfish Allergy] Hives    PAST MEDICAL HISTORY: Past Medical History  Diagnosis Date  . Allergy   . Anemia   . Recurrent boils   . Nearsightedness  wears glasses  . Chronic headache   . Arrhythmia   . WPW (Wolff-Parkinson-White syndrome)   . Obesity     MEDICATIONS AT HOME: Prior to Admission medications   Medication Sig Start Date End Date Taking? Authorizing Provider  diazepam (VALIUM) 5 MG tablet Take 1 tablet (5 mg total) by mouth every 12 (twelve) hours as needed for muscle spasms. 11/07/14  Yes Gareth Morgan, MD  ferrous sulfate 325 (65 FE) MG tablet Take 1 tablet (325 mg total) by mouth daily with  breakfast. 08/16/13  Yes Junius Creamer, NP  ibuprofen (ADVIL,MOTRIN) 800 MG tablet Take 1 tablet (800 mg total) by mouth 3 (three) times daily. 11/22/14  Yes Hannah Muthersbaugh, PA-C  methocarbamol (ROBAXIN) 500 MG tablet Take 1 tablet (500 mg total) by mouth 2 (two) times daily. 11/22/14  Yes Hannah Muthersbaugh, PA-C  naproxen (NAPROSYN) 250 MG tablet Take 1 tablet (250 mg total) by mouth 2 (two) times daily with a meal. 11/06/14  Yes Linton Flemings, MD  traMADol (ULTRAM) 50 MG tablet Take 1 tablet (50 mg total) by mouth every 6 (six) hours as needed. 11/19/14  Yes Micheline Chapman, NP  metoprolol tartrate (LOPRESSOR) 25 MG tablet Take 1 tablet (25 mg total) by mouth 2 (two) times daily. Patient not taking: Reported on 09/12/2014 08/22/13   Evans Lance, MD     Objective:   Filed Vitals:   11/27/14 0829  BP: 127/72  Pulse: 99  Temp: 98 F (36.7 C)  TempSrc: Oral  Resp: 16  Height: 5\' 9"  (1.753 m)  Weight: 269 lb (122.018 kg)    Exam General appearance : Awake, alert, not in any distress. Speech Clear. Not toxic looking, obese HEENT: Atraumatic and Normocephalic, pupils equally reactive to light and accomodation Neck: tightness around the nape of the neck and left shoulder joint, no JVD. No cervical lymphadenopathy.  Chest:Good air entry bilaterally, no added sounds  CVS: S1 S2 regular, no murmurs.  Abdomen: Bowel sounds present, Non tender and not distended with no gaurding, rigidity or rebound. 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  FINDINGS: Bone mineralization remains normal. Joint spaces and alignment are within  normal limits. Diffuse soft tissue swelling, maximal at the PIP and proximal phalanx level. No subcutaneous gas. No radiopaque foreign body identified. No acute osseous abnormality identified.  IMPRESSION: Soft tissue swelling with no acute osseous abnormality identified.  Assessment & Plan   1. Myalgia and myositis Ms. Homann and need proper workup for her symptoms and I will proceed with the following:  - MR Cervical Spine Wo Contrast; Future - ANA - Rheumatoid factor - Cyclic Citrul Peptide Antibody, IGG - RNP Antibody - CK - COMPLETE METABOLIC PANEL WITH GFR - CBC with Differential - Sedimentation Rate  2. Atlantoaxial torticollis  - MR Cervical Spine Wo Contrast; Future - ANA - Rheumatoid factor - Cyclic Citrul Peptide Antibody, IGG - RNP Antibody - CK - COMPLETE METABOLIC PANEL WITH GFR - CBC with Differential  3. Morbid obesity Patient has been counseled extensively about nutrition and exercise  4. Iron deficiency anemia  - Anemia panel  We'll follow-up with blood smear significant abnormalities showed in the panel Significant anemia may need blood transfusion  Patient have been counseled extensively about nutrition and exercise  Return in about 2 weeks (around 12/11/2014) for Routine Follow Up. and review of labs.  The patient was given clear instructions to go to ER or return to medical center if symptoms don't improve, worsen or new problems develop. The patient verbalized understanding. The patient was told to call to get lab results if they haven't heard anything in the next week.   This note has been created with Surveyor, quantity. Any transcriptional errors are unintentional.    Angelica Chessman, MD, [place], [place], [place], [place] and [facility], [place]   11/27/2014, 9:13 AM

## 2014-11-28 ENCOUNTER — Encounter: Payer: Self-pay | Admitting: Internal Medicine

## 2014-11-28 ENCOUNTER — Telehealth: Payer: Self-pay

## 2014-11-28 LAB — ANA: ANA: NEGATIVE

## 2014-11-28 LAB — URINALYSIS, COMPLETE
BILIRUBIN URINE: NEGATIVE
Casts: NONE SEEN [LPF]
Crystals: NONE SEEN [HPF]
GLUCOSE, UA: NEGATIVE
Ketones, ur: NEGATIVE
LEUKOCYTES UA: NEGATIVE
Nitrite: NEGATIVE
Protein, ur: NEGATIVE
RBC / HPF: NONE SEEN RBC/HPF (ref <=2)
SPECIFIC GRAVITY, URINE: 1.014 (ref 1.001–1.035)
Yeast: NONE SEEN [HPF]
pH: 5.5 (ref 5.0–8.0)

## 2014-11-28 LAB — COMPLETE METABOLIC PANEL WITH GFR
ALT: 6 U/L (ref 6–29)
AST: 11 U/L (ref 10–30)
Albumin: 3 g/dL — ABNORMAL LOW (ref 3.6–5.1)
Alkaline Phosphatase: 67 U/L (ref 33–115)
BUN: 8 mg/dL (ref 7–25)
CALCIUM: 8.2 mg/dL — AB (ref 8.6–10.2)
CO2: 23 mmol/L (ref 20–31)
CREATININE: 0.75 mg/dL (ref 0.50–1.10)
Chloride: 104 mmol/L (ref 98–110)
GFR, Est Non African American: >89 mL/min (ref >=60)
Glucose, Bld: 57 mg/dL — ABNORMAL LOW (ref 65–99)
Potassium: 3.9 mmol/L (ref 3.5–5.3)
Sodium: 140 mmol/L (ref 135–146)
Total Bilirubin: 0.2 mg/dL (ref 0.2–1.2)
Total Protein: 7.8 g/dL (ref 6.1–8.1)

## 2014-11-28 LAB — ANEMIA PANEL
ABS Retic: 25.8 10*3/uL (ref 19.0–186.0)
FERRITIN: 9 ng/mL — AB (ref 10–291)
Folate: 4 ng/mL
Iron: <10 ug/dL — ABNORMAL LOW (ref 42–145)
RBC.: 3.69 MIL/uL — AB (ref 3.87–5.11)
RETIC CT PCT: 0.7 % (ref 0.4–2.3)
UIBC: 311 ug/dL (ref 125–400)
Vitamin B-12: 332 pg/mL (ref 211–911)

## 2014-11-28 LAB — SEDIMENTATION RATE

## 2014-11-28 LAB — CYCLIC CITRUL PEPTIDE ANTIBODY, IGG

## 2014-11-28 LAB — RHEUMATOID FACTOR

## 2014-11-28 LAB — CK: CK TOTAL: 46 U/L (ref 7–177)

## 2014-11-28 LAB — RNP ANTIBODY: Ribonucleic Protein(ENA) Antibody, IgG: <1

## 2014-11-28 NOTE — Telephone Encounter (Signed)
Patient returned call 11/28/2014 @8 :55am. I spoke with patient, she denied any weakness/fatigue/dizziness. I advised patient hemoglobin was low and to double up on her Iron tablet until her next appointment. I advised patient if she started to have any symptoms to give [place] a call or report to ER for further evaluation. Patient verbalized understanding and denied any questions at this time. Thanks!

## 2014-11-28 NOTE — Telephone Encounter (Signed)
Amanda Davenport with [facility] called today 11/28/2014 @8 :05am to report critical hemoglobin level of 6.3.   I called Dr. Doreene Burke today 11/28/2014 @8 :10am to report hgb level. He gave me instructions to call patient and see how she was feeling. If she is having any symptoms (dizziness, weakness, fatigue) she needs to report to ER for evaluation of possible blood transfusion. If NO symptoms she needs to double up on her Iron tablet and keep her follow up appointment for 2 weeks (12/11/2014).   I called today 11/28/2014 @8 :15am no answer, left message for patient to call back concerning lab work asap. I will continue to call.

## 2014-11-29 ENCOUNTER — Telehealth: Payer: Self-pay | Admitting: Family Medicine

## 2014-11-29 NOTE — Telephone Encounter (Signed)
[facility] called stating there was not enough specimen in the lavender tube for the sed rate.

## 2014-11-30 ENCOUNTER — Ambulatory Visit (HOSPITAL_COMMUNITY)
Admission: RE | Admit: 2014-11-30 | Discharge: 2014-11-30 | Disposition: A | Discharge status: Home / Self Care | Payer: Self-pay | Source: Ambulatory Visit | Attending: Internal Medicine | Admitting: Internal Medicine

## 2014-11-30 ENCOUNTER — Ambulatory Visit (HOSPITAL_COMMUNITY): Admission: RE | Admit: 2014-11-30 | Payer: Self-pay | Source: Ambulatory Visit

## 2014-11-30 DIAGNOSIS — M609 Myositis, unspecified: Secondary | ICD-10-CM | POA: Insufficient documentation

## 2014-11-30 DIAGNOSIS — M436 Torticollis: Secondary | ICD-10-CM | POA: Insufficient documentation

## 2014-11-30 DIAGNOSIS — M791 Myalgia: Secondary | ICD-10-CM | POA: Insufficient documentation

## 2014-11-30 DIAGNOSIS — M542 Cervicalgia: Secondary | ICD-10-CM | POA: Insufficient documentation

## 2014-12-03 ENCOUNTER — Observation Stay (HOSPITAL_COMMUNITY)
Admission: EM | Admit: 2014-12-03 | Discharge: 2014-12-04 | Disposition: A | Discharge status: Home / Self Care | Payer: Self-pay | Attending: Internal Medicine | Admitting: Internal Medicine

## 2014-12-03 ENCOUNTER — Encounter (HOSPITAL_COMMUNITY): Payer: Self-pay | Admitting: *Deleted

## 2014-12-03 DIAGNOSIS — I456 Pre-excitation syndrome: Secondary | ICD-10-CM | POA: Insufficient documentation

## 2014-12-03 DIAGNOSIS — K5900 Constipation, unspecified: Secondary | ICD-10-CM | POA: Insufficient documentation

## 2014-12-03 DIAGNOSIS — D509 Iron deficiency anemia, unspecified: Principal | ICD-10-CM | POA: Insufficient documentation

## 2014-12-03 DIAGNOSIS — Z791 Long term (current) use of non-steroidal anti-inflammatories (NSAID): Secondary | ICD-10-CM | POA: Insufficient documentation

## 2014-12-03 DIAGNOSIS — M542 Cervicalgia: Secondary | ICD-10-CM | POA: Insufficient documentation

## 2014-12-03 DIAGNOSIS — Z87891 Personal history of nicotine dependence: Secondary | ICD-10-CM | POA: Insufficient documentation

## 2014-12-03 DIAGNOSIS — G8929 Other chronic pain: Secondary | ICD-10-CM | POA: Insufficient documentation

## 2014-12-03 DIAGNOSIS — Z6839 Body mass index (BMI) 39.0-39.9, adult: Secondary | ICD-10-CM | POA: Insufficient documentation

## 2014-12-03 DIAGNOSIS — R519 Headache, unspecified: Secondary | ICD-10-CM

## 2014-12-03 DIAGNOSIS — R51 Headache: Secondary | ICD-10-CM | POA: Insufficient documentation

## 2014-12-03 DIAGNOSIS — D649 Anemia, unspecified: Secondary | ICD-10-CM

## 2014-12-03 LAB — URINALYSIS, ROUTINE W REFLEX MICROSCOPIC
Bilirubin Urine: NEGATIVE
Glucose, UA: NEGATIVE mg/dL
Hgb urine dipstick: NEGATIVE
Ketones, ur: NEGATIVE mg/dL
LEUKOCYTES UA: NEGATIVE
NITRITE: NEGATIVE
PROTEIN: NEGATIVE mg/dL
SPECIFIC GRAVITY, URINE: 1.019 (ref 1.005–1.030)
UROBILINOGEN UA: 0.2 mg/dL (ref 0.0–1.0)
pH: 6.5 (ref 5.0–8.0)

## 2014-12-03 LAB — CBC WITH DIFFERENTIAL/PLATELET
Basophils Absolute: 0.1 10*3/uL (ref 0.0–0.1)
Basophils Relative: 1 % (ref 0–1)
Eosinophils Absolute: 0.1 10*3/uL (ref 0.0–0.7)
Eosinophils Relative: 1 % (ref 0–5)
HEMATOCRIT: 23.3 % — AB (ref 36.0–46.0)
Hemoglobin: 6.5 g/dL — CL (ref 12.0–15.0)
LYMPHS ABS: 2.2 10*3/uL (ref 0.7–4.0)
Lymphocytes Relative: 26 % (ref 12–46)
MCH: 17.2 pg — ABNORMAL LOW (ref 26.0–34.0)
MCHC: 27.9 g/dL — AB (ref 30.0–36.0)
MCV: 61.5 fL — ABNORMAL LOW (ref 78.0–100.0)
MONOS PCT: 6 % (ref 3–12)
Monocytes Absolute: 0.5 10*3/uL (ref 0.1–1.0)
NEUTROS ABS: 5.6 10*3/uL (ref 1.7–7.7)
Neutrophils Relative %: 66 % (ref 43–77)
PLATELETS: 655 10*3/uL — AB (ref 150–400)
RBC: 3.79 MIL/uL — ABNORMAL LOW (ref 3.87–5.11)
RDW: 19.6 % — ABNORMAL HIGH (ref 11.5–15.5)
WBC: 8.5 10*3/uL (ref 4.0–10.5)

## 2014-12-03 LAB — BASIC METABOLIC PANEL
Anion gap: 4 — ABNORMAL LOW (ref 5–15)
BUN: 12 mg/dL (ref 6–20)
CHLORIDE: 107 mmol/L (ref 101–111)
CO2: 25 mmol/L (ref 22–32)
Calcium: 8.2 mg/dL — ABNORMAL LOW (ref 8.9–10.3)
Creatinine, Ser: 0.89 mg/dL (ref 0.44–1.00)
GFR calc non Af Amer: >60 mL/min (ref >60)
GLUCOSE: 94 mg/dL (ref 65–99)
Potassium: 3.7 mmol/L (ref 3.5–5.1)
Sodium: 136 mmol/L (ref 135–145)

## 2014-12-03 LAB — SEDIMENTATION RATE: SED RATE: 77 mm/h — AB (ref 0–22)

## 2014-12-03 LAB — POC URINE PREG, ED: Preg Test, Ur: NEGATIVE

## 2014-12-03 LAB — PREPARE RBC (CROSSMATCH)

## 2014-12-03 LAB — ABO/RH: ABO/RH(D): O POS

## 2014-12-03 MED ORDER — IBUPROFEN 800 MG PO TABS
800.0000 mg | ORAL_TABLET | Freq: Once | ORAL | Status: AC
  Administered 2014-12-03: 800 mg via ORAL
  Filled 2014-12-03: qty 1

## 2014-12-03 MED ORDER — ENOXAPARIN SODIUM 40 MG/0.4ML ~~LOC~~ SOLN
40.0000 mg | SUBCUTANEOUS | Status: DC
  Filled 2014-12-03 (×2): qty 0.4

## 2014-12-03 MED ORDER — TRAMADOL HCL 50 MG PO TABS
50.0000 mg | ORAL_TABLET | Freq: Four times a day (QID) | ORAL | Status: DC | PRN
  Administered 2014-12-03 – 2014-12-04 (×2): 50 mg via ORAL
  Filled 2014-12-03 (×2): qty 1

## 2014-12-03 MED ORDER — DIAZEPAM 5 MG PO TABS: 5.0000 mg | ORAL_TABLET | Freq: Two times a day (BID) | ORAL | Status: DC | PRN

## 2014-12-03 MED ORDER — DIAZEPAM 5 MG/ML IJ SOLN
2.5000 mg | Freq: Once | INTRAMUSCULAR | Status: AC
  Administered 2014-12-03: 2.5 mg via INTRAVENOUS
  Filled 2014-12-03: qty 2

## 2014-12-03 MED ORDER — ONDANSETRON HCL 4 MG PO TABS: 4.0000 mg | ORAL_TABLET | Freq: Four times a day (QID) | ORAL | Status: DC | PRN

## 2014-12-03 MED ORDER — ONDANSETRON HCL 4 MG/2ML IJ SOLN: 4.0000 mg | Freq: Four times a day (QID) | INTRAMUSCULAR | Status: DC | PRN

## 2014-12-03 MED ORDER — SODIUM CHLORIDE 0.9 % IV SOLN
10.0000 mL/h | Freq: Once | INTRAVENOUS | Status: AC
  Administered 2014-12-03: 10 mL/h via INTRAVENOUS

## 2014-12-03 MED ORDER — LIDOCAINE 5 % EX PTCH
1.0000 | MEDICATED_PATCH | CUTANEOUS | Status: DC
  Administered 2014-12-03 – 2014-12-04 (×2): 1 via TRANSDERMAL
  Filled 2014-12-03 (×2): qty 1

## 2014-12-03 MED ORDER — OXYCODONE HCL 5 MG PO TABS
5.0000 mg | ORAL_TABLET | ORAL | Status: DC | PRN
  Administered 2014-12-03: 5 mg via ORAL
  Filled 2014-12-03: qty 1

## 2014-12-03 MED ORDER — METOCLOPRAMIDE HCL 5 MG/ML IJ SOLN
10.0000 mg | Freq: Once | INTRAMUSCULAR | Status: AC
  Administered 2014-12-03: 10 mg via INTRAVENOUS
  Filled 2014-12-03: qty 2

## 2014-12-03 MED ORDER — SODIUM CHLORIDE 0.9 % IV SOLN: 100.0000 mg | Freq: Once | INTRAVENOUS | Status: DC

## 2014-12-03 MED ORDER — DIPHENHYDRAMINE HCL 50 MG/ML IJ SOLN
25.0000 mg | Freq: Once | INTRAMUSCULAR | Status: AC
  Administered 2014-12-03: 25 mg via INTRAVENOUS
  Filled 2014-12-03: qty 1

## 2014-12-03 MED ORDER — POLYETHYLENE GLYCOL 3350 17 G PO PACK
17.0000 g | PACK | Freq: Once | ORAL | Status: AC
  Administered 2014-12-03: 17 g via ORAL
  Filled 2014-12-03: qty 1

## 2014-12-03 MED ORDER — METHOCARBAMOL 500 MG PO TABS
500.0000 mg | ORAL_TABLET | Freq: Two times a day (BID) | ORAL | Status: DC
  Administered 2014-12-03 – 2014-12-04 (×3): 500 mg via ORAL
  Filled 2014-12-03 (×5): qty 1

## 2014-12-03 MED ORDER — SODIUM CHLORIDE 0.9 % IV SOLN
INTRAVENOUS | Status: AC
  Administered 2014-12-03: 12:00:00 via INTRAVENOUS

## 2014-12-03 MED ORDER — SODIUM CHLORIDE 0.9 % IV SOLN
75.0000 mg | Freq: Once | INTRAVENOUS | Status: AC
  Administered 2014-12-03: 75 mg via INTRAVENOUS
  Filled 2014-12-03: qty 1.5

## 2014-12-03 MED ORDER — SODIUM CHLORIDE 0.9 % IV SOLN
25.0000 mg | Freq: Once | INTRAVENOUS | Status: AC
  Administered 2014-12-03: 25 mg via INTRAVENOUS
  Filled 2014-12-03: qty 0.5

## 2014-12-03 NOTE — ED Notes (Signed)
Pt coming from home with c/o dizziness, earache (left), neck pain and headache x 1 month. Pt is moaning and keeps her eyes closed, sts was told her hemoglobin was low and that she needs blood transfusion on Friday by her PCP. Pt reports hx of same in 2013.

## 2014-12-03 NOTE — ED Notes (Signed)
Please call pt's father with any changes [phone]

## 2014-12-03 NOTE — ED Notes (Signed)
Blood consent obtained and paper at bedside

## 2014-12-03 NOTE — ED Notes (Signed)
Pt alert and oriented x4. Respirations even and unlabored, bilateral symmetrical rise and fall of chest. Skin warm and dry. In no acute distress. Denies needs.   

## 2014-12-03 NOTE — H&P (Signed)
History and Physical  Amanda Davenport:[id number] DOB: 1981/02/02 DOA: 12/03/2014  Referring physician: Jeannett Senior, ER PA PCP: Angelica Chessman, MD   Chief Complaint: Anemia  HPI: Amanda Davenport is a 34 y.o. female  Past history morbid obesity, WPW and iron deficiency anemia who stopped taking her iron because of issues with constipation. She came in complaining of a headache and neck pain, although these are chronic issues and was found to have a hemoglobin of 6. She reports no dizziness, and on further exam, her MCV was in mid 67s. Patient was admitted for a transfusion. Hospitals were called for further evaluation   Review of Systems:  Patient seen after arrival to floor. Pt complains of chronic neck pain. Worse when she turns her head to the left. Also complains of a mild chronic headache.  She claims of issues with constipation, especially when taking her iron  Pt denies any vision changes dysphagia, chest pain, palpitations, shortness of breath, wheeze, cough, abdominal pain, hematuria, dysuria, diarrhea, focal extremity numbness weakness or pain.  Review of systems are otherwise negative  Past Medical History  Diagnosis Date  . Allergy   . Anemia   . Recurrent boils   . Nearsightedness     wears glasses  . Chronic headache   . Arrhythmia   . WPW (Wolff-Parkinson-White syndrome)   . Obesity    Past Surgical History  Procedure Laterality Date  . Cystectomy      tonsils   Social History:  reports that she has quit smoking. Her smoking use included Cigarettes. She quit after .5 years of use. She has never used smokeless tobacco. She reports that she does not drink alcohol or use illicit drugs. Patient lives at home by herself & is able to participate in activities of daily living without assistance  Allergies  Allergen Reactions  . Other Other (See Comments)    All Antibiotics cause severe vaginal yeast infections  . Penicillins Hives, Itching and Swelling    . Shellfish-Derived Products Hives  . Shrimp [Shellfish Allergy] Hives    Family History  Problem Relation Age of Onset  . Breast cancer Mother   . Pulmonary embolism Mother     died of PE  . Diabetes Paternal Grandmother   . Heart disease Neg Hx   . Stroke Neg Hx   . Colon cancer Mother   . Irritable bowel syndrome Mother       Prior to Admission medications   Medication Sig Start Date End Date Taking? Authorizing Provider  diazepam (VALIUM) 5 MG tablet Take 1 tablet (5 mg total) by mouth every 12 (twelve) hours as needed for muscle spasms. 11/07/14  Yes Gareth Morgan, MD  Ibuprofen-Diphenhydramine Cit (IBUPROFEN PM) 200-38 MG TABS Take 1 tablet by mouth at bedtime.   Yes Historical Provider, MD  methocarbamol (ROBAXIN) 500 MG tablet Take 1 tablet (500 mg total) by mouth 2 (two) times daily. 11/22/14  Yes Hannah Muthersbaugh, PA-C  traMADol (ULTRAM) 50 MG tablet Take 1 tablet (50 mg total) by mouth every 6 (six) hours as needed. Patient taking differently: Take 50 mg by mouth every 6 (six) hours as needed for moderate pain.  11/19/14  Yes Micheline Chapman, NP  ferrous sulfate 325 (65 FE) MG tablet Take 1 tablet (325 mg total) by mouth daily with breakfast. Patient not taking: Reported on 12/03/2014 08/16/13   Junius Creamer, NP    Physical Exam: BP 112/66 mmHg  Pulse 103  Temp(Src) 98.2 F (36.8  C) (Oral)  Resp 17  Ht 5\' 9"  (1.753 m)  Wt 121.564 kg (268 lb)  BMI 39.56 kg/m2  SpO2 100%  LMP 11/12/2014  General:  Alert and oriented 3, no acute distress Eyes: Sclera nonicteric, extraocular movements are intact ENT: Normocephalic and atraumatic, mucous membranes are slightly dry Neck: No JVD Cardiovascular: Regular rate and rhythm, S1-S2 Respiratory: Clear to auscultation bilaterally Abdomen: Soft, obese, nontender, positive bowel sounds Skin: No skin breaks, tears or lesions Musculoskeletal: No clubbing or cyanosis, trace edema Psychiatric: Patient is appropriate, no  evidence of psychoses Neurologic: no focal deficits           Labs on Admission:  Basic Metabolic Panel:  Recent Labs Lab 11/27/14 0918 12/03/14 0539  NA 140 136  K 3.9 3.7  CL 104 107  CO2 23 25  GLUCOSE 57* 94  BUN 8 12  CREATININE 0.75 0.89  CALCIUM 8.2* 8.2*   Liver Function Tests:  Recent Labs Lab 11/27/14 0918  AST 11  ALT 6  ALKPHOS 67  BILITOT 0.2  PROT 7.8  ALBUMIN 3.0*   No results for input(s): LIPASE, AMYLASE in the last 168 hours. No results for input(s): AMMONIA in the last 168 hours. CBC:  Recent Labs Lab 11/27/14 0918 12/03/14 0539  WBC 8.6 8.5  NEUTROABS 5.9 5.6  HGB 6.3* 6.5*  HCT 23.0* 23.3*  MCV 62.3* 61.5*  PLT 623* 655*   Cardiac Enzymes:  Recent Labs Lab 11/27/14 0918  CKTOTAL 46    BNP (last 3 results) No results for input(s): BNP in the last 8760 hours.  ProBNP (last 3 results) No results for input(s): PROBNP in the last 8760 hours.  CBG: No results for input(s): GLUCAP in the last 168 hours.  Radiological Exams on Admission: No results found.  EKG: Independently reviewed. Not done  Assessment/Plan Present on Admission:  . Wolff-Parkinson-White (WPW) syndrome: Stable medical issue  . Iron deficiency anemia: Secondary to noncompliance. Transfuse 2 units packed red blood cells. Lasix afterwards. IV iron infusion. Discharge following  . Morbid obesity: Patient meets criteria with BMI greater than 40  . Constipation: We'll try MiraLAX.  Marland Kitchen Neck pain: Subacute. Unclear etiology. Outpatient workup. MRI negative. Will try lidocaine patch  Consultants: None  Code Status: Full code  Family Communication: Left message with family   Disposition Plan: Likely discharge later on today  Time spent: 20 minutes  [facility] Pager [phone]

## 2014-12-03 NOTE — ED Provider Notes (Signed)
CSN: [id number]     Arrival date & time 12/03/14  0512 History   First MD Initiated Contact with Patient 12/03/14 0602     Chief Complaint  Patient presents with  . multiple complaints     (Consider location/radiation/quality/duration/timing/severity/associated sxs/prior Treatment) HPI Amanda Davenport is a 34 y.o. female with history of anemia, WPW, chronic headaches and chronic neck pain, presents to emergency department complaining of persistent headache and neck pain. Patient states she has had this headache for several years. She states "no one can figure out what's wrong with me." Patient stated that her primary care doctor currently has her on muscle relaxant which she states is not helping. Patient states the pain is behind bilateral ears and radiates down the neck. Patient also just had MRI of the cervical spine which was normal. Patient denies any pain radiating down to extremities. No numbness or weakness in extremities. No fever. Patient states she does have photophobia. She normally wears glasses but does not have any on at this time. She states last several days she has had to sleep on the couch because she is uncomfortable in her bed. Patient states main reason she is here is because her primary care doctor called her yesterday and told her that her hemoglobin is 6 and she needed to come to emergency department to get blood transfusion. Patient admits to weakness, lightheadedness, dizziness  Past Medical History  Diagnosis Date  . Allergy   . Anemia   . Recurrent boils   . Nearsightedness     wears glasses  . Chronic headache   . Arrhythmia   . WPW (Wolff-Parkinson-White syndrome)   . Obesity    Past Surgical History  Procedure Laterality Date  . Cystectomy      tonsils   Family History  Problem Relation Age of Onset  . Breast cancer Mother   . Pulmonary embolism Mother     died of PE  . Diabetes Paternal Grandmother   . Heart disease Neg Hx   . Stroke Neg Hx   .  Colon cancer Mother   . Irritable bowel syndrome Mother    Social History  Substance Use Topics  . Smoking status: Former Smoker -- .5 years    Types: Cigarettes  . Smokeless tobacco: Never Used     Comment: smokes black and milds  . Alcohol Use: No   OB History    Gravida Para Term Preterm AB TAB SAB Ectopic Multiple Living   0              Review of Systems  Constitutional: Negative for fever and chills.  Respiratory: Negative for cough, chest tightness and shortness of breath.   Cardiovascular: Negative for chest pain, palpitations and leg swelling.  Gastrointestinal: Negative for nausea, vomiting, abdominal pain and diarrhea.  Genitourinary: Negative for dysuria, flank pain and pelvic pain.  Musculoskeletal: Positive for neck pain. Negative for myalgias, arthralgias and neck stiffness.  Skin: Negative for rash.  Neurological: Positive for dizziness, light-headedness and headaches. Negative for weakness.  All other systems reviewed and are negative.     Allergies  Other; Penicillins; Shellfish-derived products; and Shrimp  Home Medications   Prior to Admission medications   Medication Sig Start Date End Date Taking? Authorizing Provider  diazepam (VALIUM) 5 MG tablet Take 1 tablet (5 mg total) by mouth every 12 (twelve) hours as needed for muscle spasms. 11/07/14  Yes Gareth Morgan, MD  ibuprofen (ADVIL,MOTRIN) 800 MG tablet Take 1 tablet (  800 mg total) by mouth 3 (three) times daily. 11/22/14  Yes Hannah Muthersbaugh, PA-C  Ibuprofen-Diphenhydramine Cit (IBUPROFEN PM) 200-38 MG TABS Take 1 tablet by mouth at bedtime.   Yes Historical Provider, MD  methocarbamol (ROBAXIN) 500 MG tablet Take 1 tablet (500 mg total) by mouth 2 (two) times daily. 11/22/14  Yes Hannah Muthersbaugh, PA-C  traMADol (ULTRAM) 50 MG tablet Take 1 tablet (50 mg total) by mouth every 6 (six) hours as needed. Patient taking differently: Take 50 mg by mouth every 6 (six) hours as needed for moderate  pain.  11/19/14  Yes Micheline Chapman, NP  ferrous sulfate 325 (65 FE) MG tablet Take 1 tablet (325 mg total) by mouth daily with breakfast. Patient not taking: Reported on 12/03/2014 08/16/13   Junius Creamer, NP  metoprolol tartrate (LOPRESSOR) 25 MG tablet Take 1 tablet (25 mg total) by mouth 2 (two) times daily. Patient not taking: Reported on 09/12/2014 08/22/13   Evans Lance, MD  naproxen (NAPROSYN) 250 MG tablet Take 1 tablet (250 mg total) by mouth 2 (two) times daily with a meal. Patient not taking: Reported on 12/03/2014 11/06/14   Linton Flemings, MD   BP 122/74 mmHg  Pulse 92  Temp(Src) 97.9 F (36.6 C) (Oral)  Resp 18  Ht 5\' 9"  (1.753 m)  Wt 268 lb (121.564 kg)  BMI 39.56 kg/m2  SpO2 100%  LMP 11/12/2014 Physical Exam  Constitutional: She is oriented to person, place, and time. She appears well-developed and well-nourished. No distress.  HENT:  Head: Normocephalic and atraumatic.  Right Ear: External ear normal.  Left Ear: External ear normal.  Eyes: Conjunctivae and EOM are normal. Pupils are equal, round, and reactive to light.  Neck: Normal range of motion. Neck supple.  Full ROM  Cardiovascular: Normal rate, regular rhythm and normal heart sounds.   Pulmonary/Chest: Effort normal and breath sounds normal. No respiratory distress. She has no wheezes. She has no rales.  Abdominal: Soft. Bowel sounds are normal. She exhibits no distension. There is no tenderness. There is no rebound.  Musculoskeletal: She exhibits no edema.  Neurological: She is alert and oriented to person, place, and time. No cranial nerve deficit. Coordination normal.  5/5 and equal upper and lower extremity strength bilaterally. Equal grip strength bilaterally. Normal finger to nose and heel to shin. No pronator drift.   Skin: Skin is warm and dry.  Psychiatric: She has a normal mood and affect. Her behavior is normal.  Nursing note and vitals reviewed.   ED Course  Procedures (including critical care  time) Labs Review Labs Reviewed  CBC WITH DIFFERENTIAL/PLATELET - Abnormal; Notable for the following:    RBC 3.79 (*)    Hemoglobin 6.5 (*)    HCT 23.3 (*)    MCV 61.5 (*)    MCH 17.2 (*)    MCHC 27.9 (*)    RDW 19.6 (*)    Platelets 655 (*)    All other components within normal limits  BASIC METABOLIC PANEL - Abnormal; Notable for the following:    Calcium 8.2 (*)    Anion gap 4 (*)    All other components within normal limits  URINALYSIS, ROUTINE W REFLEX MICROSCOPIC (NOT AT [facility]) - Abnormal; Notable for the following:    APPearance CLOUDY (*)    All other components within normal limits  POC URINE PREG, ED  TYPE AND SCREEN  PREPARE RBC (CROSSMATCH)  ABO/RH    Imaging Review No results found. I have  personally reviewed and evaluated these images and lab results as part of my medical decision-making.   EKG Interpretation None      MDM   Final diagnoses:  Anemia, unspecified anemia type  Nonintractable headache, unspecified chronicity pattern, unspecified headache type  Chronic neck pain    patient in the emergency department with chronic headache, chronic neck pain, anemia. Hemoglobin is down to 6 from baseline of 8-1/2. We will type and screen, repeat labs, Reglan, Benadryl, Valium ordered for her symptoms. Vital signs normal.  7:02 AM hgb 6.5, 2 units of PRBC ordered.   Spoke [facility] hospitalist, will admit for transfusing. Pain improved with medications.   Filed Vitals:   12/03/14 0715 12/03/14 0736 12/03/14 0755 12/03/14 0830  BP: 112/66 110/67 100/59 116/69  Pulse: 83 81 84 81  Temp: 98.8 F (37.1 C) 98 F (36.7 C) 98 F (36.7 C)   TempSrc: Oral Oral Oral   Resp: 16 16 16 25   Height:      Weight:      SpO2: 98% 98% 99% 97%     Jeannett Senior, PA-C 12/03/14 0854  Everlene Balls, MD 12/03/14 1528

## 2014-12-04 ENCOUNTER — Other Ambulatory Visit: Payer: Self-pay | Admitting: Internal Medicine

## 2014-12-04 ENCOUNTER — Telehealth: Payer: Self-pay

## 2014-12-04 DIAGNOSIS — K029 Dental caries, unspecified: Secondary | ICD-10-CM

## 2014-12-04 DIAGNOSIS — I456 Pre-excitation syndrome: Secondary | ICD-10-CM

## 2014-12-04 LAB — HEMOGLOBIN AND HEMATOCRIT, BLOOD
HEMATOCRIT: 27.4 % — AB (ref 36.0–46.0)
HEMOGLOBIN: 8 g/dL — AB (ref 12.0–15.0)

## 2014-12-04 LAB — TYPE AND SCREEN
ABO/RH(D): O POS
Antibody Screen: NEGATIVE
Unit division: 0
Unit division: 0

## 2014-12-04 MED ORDER — METHYLPREDNISOLONE 4 MG PO TBPK: ORAL_TABLET | ORAL | Status: DC

## 2014-12-04 MED ORDER — PREDNISONE 50 MG PO TABS
50.0000 mg | ORAL_TABLET | Freq: Every day | ORAL | Status: DC
  Administered 2014-12-04: 50 mg via ORAL
  Filled 2014-12-04 (×2): qty 1

## 2014-12-04 MED ORDER — MAGNESIUM CITRATE PO SOLN
1.0000 | Freq: Once | ORAL | Status: AC
  Administered 2014-12-04: 1 via ORAL

## 2014-12-04 MED ORDER — POLYETHYLENE GLYCOL 3350 17 G PO PACK: 17.0000 g | PACK | Freq: Every day | ORAL | Status: DC

## 2014-12-04 MED ORDER — POLYETHYLENE GLYCOL 3350 17 G PO PACK
17.0000 g | PACK | Freq: Once | ORAL | Status: AC
  Administered 2014-12-04: 17 g via ORAL
  Filled 2014-12-04: qty 1

## 2014-12-04 NOTE — Telephone Encounter (Signed)
-----   Message from Tresa Garter, MD sent at 11/30/2014  6:29 PM EDT ----- Please informed patient that her laboratory results show severe iron deficiency anemia, she needs to double up on her medications, if hemoglobin goes below 6 we will transfuse. Platelet is still high but there was no abnormality seen on smear review. All other tests for autoimmune disorders including lupus, rheumatoid arthritis and connective tissue disorder are negative. Encouraged patient to keep the appointment for follow-up for further evaluation.

## 2014-12-04 NOTE — Discharge Summary (Signed)
Discharge Summary  Amanda Davenport:[id number] DOB: April 06, 1981  PCP: Angelica Chessman, MD  Admit date: 12/03/2014 Discharge date: 12/04/2014  Time spent: 35 minutes  Recommendations for Outpatient Follow-up:  1. New medication: Medrol Dosepak 2. New medication: MiraLAX daily 3. Medication change: Patient has been aggressively taking Motrin 800 mg 3 times a day for several weeks now. I've advised her to stop this immediately along with all other aspirin products for fear of bleeding ulcer. 4.  Patient has been encouraged to restart taking her iron which she had quit because of constipation  Discharge Diagnoses:  Active Hospital Problems   Diagnosis Date Noted  . Constipation 12/03/2014  . Neck pain 12/03/2014  . Morbid obesity 11/27/2014  . Wolff-Parkinson-White (WPW) syndrome 03/01/2012  . Iron deficiency anemia 02/16/2012    Resolved Hospital Problems   Diagnosis Date Noted Date Resolved  No resolved problems to display.    Discharge Condition: Improved, being discharged home  Diet recommendation: Regular  Filed Weights   12/03/14 0519  Weight: 121.564 kg (268 lb)    History of present illness:  34 year old female with past medical history of Wolff-Parkinson-White syndrome and iron deficiency anemia who came to the emergency room on 8/29 complaining of neck pain that had been ongoing now for several weeks and was found to have a hemoglobin of 5 with low MCV. No signs of acute blood loss. Patient was admitted to the hospitalist service.  Hospital Course:  Active Problems:   Iron deficiency anemia: Patient received 2 units packed red blood cells plus an infusion of iron. Encouraged to continue on iron. Follow-up hemoglobin at 8.0   Wolff-Parkinson-White (WPW) syndrome: Stable medical issues during this hospitalization   Morbid obesity: Patient meets criteria with BMI greater than 40   Constipation: Secondary to iron and pain medication. No response initially to  MiraLAX. Patient given bottle of mag citrate with good response. Discharged home on MiraLAX.   Neck pain: Unclear etiology. Patient has had extensive workup including MRI. Unclear cause. She has been aggressively taking anti-inflammatory which she says is the only thing that helps. Concerned about the possibility of ulcer soft start her on prednisone Dosepak. She is a follow-up 1 with her PCP next week.   Procedures:  Transfusion 2 units packed red blood cells and 1 infusion of iron on 8/29  Consultations:  None  Discharge Exam: BP 130/74 mmHg  Pulse 91  Temp(Src) 98.6 F (37 C) (Oral)  Resp 18  Ht 5\' 9"  (1.753 m)  Wt 121.564 kg (268 lb)  BMI 39.56 kg/m2  SpO2 99%  LMP 11/12/2014  General: Alert and oriented 3 Cardiovascular: Regular rate and rhythm, S1-S2 Respiratory: Clear to auscultation bilaterally  Discharge Instructions You were cared for by a hospitalist during your hospital stay. If you have any questions about your discharge medications or the care you received while you were in the hospital after you are discharged, you can call the unit and asked to speak with the hospitalist on call if the hospitalist that took care of you is not available. Once you are discharged, your primary care physician will handle any further medical issues. Please note that NO REFILLS for any discharge medications will be authorized once you are discharged, as it is imperative that you return to your primary care physician (or establish a relationship with a primary care physician if you do not have one) for your aftercare needs so that they can reassess your need for medications and monitor your lab values.  Discharge Instructions    Diet - low sodium heart healthy    Complete by:  As directed      Increase activity slowly    Complete by:  As directed             Medication List    TAKE these medications        diazepam 5 MG tablet  Commonly known as:  VALIUM  Take 1 tablet (5 mg  total) by mouth every 12 (twelve) hours as needed for muscle spasms.     ferrous sulfate 325 (65 FE) MG tablet  Take 1 tablet (325 mg total) by mouth daily with breakfast.     IBUPROFEN PM 200-38 MG Tabs  Generic drug:  Ibuprofen-Diphenhydramine Cit  Take 1 tablet by mouth at bedtime.     methocarbamol 500 MG tablet  Commonly known as:  ROBAXIN  Take 1 tablet (500 mg total) by mouth 2 (two) times daily.     methylPREDNISolone 4 MG Tbpk tablet  Commonly known as:  MEDROL DOSEPAK  Use as directed     polyethylene glycol packet  Commonly known as:  MIRALAX  Take 17 g by mouth daily.     traMADol 50 MG tablet  Commonly known as:  ULTRAM  Take 1 tablet (50 mg total) by mouth every 6 (six) hours as needed.       Allergies  Allergen Reactions  . Other Other (See Comments)    All Antibiotics cause severe vaginal yeast infections  . Penicillins Hives, Itching and Swelling  . Shellfish-Derived Products Hives  . Shrimp [Shellfish Allergy] Hives       Follow-up Information    Follow up with JEGEDE, OLUGBEMIGA, MD In 1 month.   Specialty:  Internal Medicine   Contact information:   [place] [address] [phone]        The results of significant diagnostics from this hospitalization (including imaging, microbiology, ancillary and laboratory) are listed below for reference.    Significant Diagnostic Studies: Ct Head Wo Contrast  11/07/2014   CLINICAL DATA:  Patient with sharp pain to the back of the head and bilaterally for years. Blurry vision.  EXAM: CT HEAD WITHOUT CONTRAST  TECHNIQUE: Contiguous axial images were obtained from the base of the skull through the vertex without intravenous contrast.  COMPARISON:  None.  FINDINGS: Ventricles and sulci are appropriate for patient's age. No evidence for acute cortically based infarct, intracranial hemorrhage, mass lesion or mass-effect. Cavum septum pellucidum. Orbits are unremarkable. Paranasal sinuses are  unremarkable. Mastoid air cells are well aerated. Calvarium is intact.  IMPRESSION: No acute intracranial process.   Electronically Signed   By: Lovey Newcomer M.D.   On: 11/07/2014 23:17   Mr Cervical Spine Wo Contrast  11/30/2014   CLINICAL DATA:  Myalgia and myositis. Atlantoaxial torticollis. Neck pain and stiffness  EXAM: MRI CERVICAL SPINE WITHOUT CONTRAST  TECHNIQUE: Multiplanar, multisequence MR imaging of the cervical spine was performed. No intravenous contrast was administered.  COMPARISON:  None.  FINDINGS: Suboptimal image quality on this study.  Normal alignment with straightening of the cervical lordosis. Negative for fracture or mass lesion. Bone marrow signal is normal. Cervical medullary junction normal. Cervical spinal cord signal normal.  C1-C2 appears normal in alignment. No effusion or soft tissue thickening.  Negative for disc degeneration. No disc protrusion or spurring. No significant spinal stenosis.  IMPRESSION: Negative   Electronically Signed   By: Jorja Loa.D.  On: 11/30/2014 20:14    Microbiology: No results found for this or any previous visit (from the past 240 hour(s)).   Labs: Basic Metabolic Panel:  Recent Labs Lab 12/03/14 0539  NA 136  K 3.7  CL 107  CO2 25  GLUCOSE 94  BUN 12  CREATININE 0.89  CALCIUM 8.2*   Liver Function Tests: No results for input(s): AST, ALT, ALKPHOS, BILITOT, PROT, ALBUMIN in the last 168 hours. No results for input(s): LIPASE, AMYLASE in the last 168 hours. No results for input(s): AMMONIA in the last 168 hours. CBC:  Recent Labs Lab 12/03/14 0539 12/04/14 0949  WBC 8.5  --   NEUTROABS 5.6  --   HGB 6.5* 8.0*  HCT 23.3* 27.4*  MCV 61.5*  --   PLT 655*  --    Cardiac Enzymes: No results for input(s): CKTOTAL, CKMB, CKMBINDEX, TROPONINI in the last 168 hours. BNP: BNP (last 3 results) No results for input(s): BNP in the last 8760 hours.  ProBNP (last 3 results) No results for input(s): PROBNP in the last  8760 hours.  CBG: No results for input(s): GLUCAP in the last 168 hours.     Signed:  Annita Brod  [facility] 12/04/2014, 6:09 PM

## 2014-12-04 NOTE — Telephone Encounter (Signed)
Called and informed patient of lab results, patient is currently in [facility] and has received a blood transfusion to help with the hemoglobin. Encouraged patient to keep follow up appointment in one week. Patient stated she understood all instructions and had no additional questions at this time. Thanks!

## 2014-12-04 NOTE — Discharge Instructions (Signed)
STOP MOTRIN/IBUPROFEN

## 2014-12-06 ENCOUNTER — Other Ambulatory Visit (HOSPITAL_COMMUNITY): Payer: Self-pay

## 2014-12-11 ENCOUNTER — Encounter: Payer: Self-pay | Admitting: Internal Medicine

## 2014-12-11 ENCOUNTER — Ambulatory Visit (INDEPENDENT_AMBULATORY_CARE_PROVIDER_SITE_OTHER): Payer: Self-pay | Admitting: Internal Medicine

## 2014-12-11 VITALS — BP 129/63 | HR 92 | Temp 98.5°F | Resp 18 | Ht 69.0 in | Wt 266.0 lb

## 2014-12-11 DIAGNOSIS — S161XXA Strain of muscle, fascia and tendon at neck level, initial encounter: Secondary | ICD-10-CM | POA: Insufficient documentation

## 2014-12-11 DIAGNOSIS — L732 Hidradenitis suppurativa: Secondary | ICD-10-CM | POA: Insufficient documentation

## 2014-12-11 DIAGNOSIS — K029 Dental caries, unspecified: Secondary | ICD-10-CM

## 2014-12-11 DIAGNOSIS — D509 Iron deficiency anemia, unspecified: Secondary | ICD-10-CM

## 2014-12-11 LAB — TSH: TSH: 1.143 u[IU]/mL (ref 0.350–4.500)

## 2014-12-11 MED ORDER — FLUCONAZOLE 150 MG PO TABS: 150.0000 mg | ORAL_TABLET | Freq: Once | ORAL | Status: DC

## 2014-12-11 MED ORDER — SULFAMETHOXAZOLE-TRIMETHOPRIM 800-160 MG PO TABS: 1.0000 | ORAL_TABLET | Freq: Two times a day (BID) | ORAL | Status: AC

## 2014-12-11 NOTE — Progress Notes (Signed)
Patient ID: Amanda Davenport, female   DOB: 1980/10/03, 34 y.o.   MRN: [id number]   Amanda Davenport, is a 34 y.o. female  HUD:[id number]  [id number]  DOB - 1980/05/23  Chief Complaint  Patient presents with  . Follow-up    review of lab results        Subjective:   Amanda Davenport is a 34 y.o. female here today for a follow up visit. Patient was recently admitted to the hospital for very low hemoglobin , she was transfused with 2 units of blood and hemoglobin is up to 8. Patient has extensive history of chronic iron deficiency anemia , recurrent hidradenitis in both axilla and perineal area , WPW syndrome , chronic pain, chronic headache and obesity. At the last visit she had an extensive laboratory workup at that time negative for auditory mood disorder and connective tissue disease.  Patient also had MRI done that were negative for any acute disease. Patient is here today for follow-up. She claims she is much better and pain is much reduced. She says she can now move her neck sideways without pain. Her only other issue today is discharged from some spots in her axillary region from the hidradenitis. She has had this for a very long time and has not been any permanent solution , she is requesting a referral to dermatologist if possible. She also has some dental caries and cavities plan she will like to have checked out by a dentist.  Patient has no major reason for her chronic anemia , she denies any excessive bleeding , no history of fibroid all heavy menstrual flow , pelvic ultrasound was normal. She had a colonoscopy in 2014 to evaluate for possible chronic colonic blood loss ,  2 polyps were removed otherwise normal colonoscopy, however patient continues to have iron deficiency anemia. Patient has No headache, No chest pain, No abdominal pain - No Nausea, No new weakness tingling or numbness, No Cough - SOB.  Problem  Neck Strain  Hydradenitis  Dental Caries    ALLERGIES: Allergies   Allergen Reactions  . Other Other (See Comments)    All Antibiotics cause severe vaginal yeast infections  . Penicillins Hives, Itching and Swelling  . Shellfish-Derived Products Hives  . Shrimp [Shellfish Allergy] Hives    PAST MEDICAL HISTORY: Past Medical History  Diagnosis Date  . Allergy   . Anemia   . Recurrent boils   . Nearsightedness     wears glasses  . Chronic headache   . Arrhythmia   . WPW (Wolff-Parkinson-White syndrome)   . Obesity     MEDICATIONS AT HOME: Prior to Admission medications   Medication Sig Start Date End Date Taking? Authorizing Provider  diazepam (VALIUM) 5 MG tablet Take 1 tablet (5 mg total) by mouth every 12 (twelve) hours as needed for muscle spasms. 11/07/14  Yes Gareth Morgan, MD  ferrous sulfate 325 (65 FE) MG tablet Take 1 tablet (325 mg total) by mouth daily with breakfast. 08/16/13  Yes Junius Creamer, NP  methocarbamol (ROBAXIN) 500 MG tablet Take 1 tablet (500 mg total) by mouth 2 (two) times daily. 11/22/14  Yes Hannah Muthersbaugh, PA-C  methylPREDNISolone (MEDROL DOSEPAK) 4 MG TBPK tablet Use as directed 12/04/14  Yes Annita Brod, MD  traMADol (ULTRAM) 50 MG tablet Take 1 tablet (50 mg total) by mouth every 6 (six) hours as needed. Patient taking differently: Take 50 mg by mouth every 6 (six) hours as needed for moderate pain.  11/19/14  Yes Micheline Chapman, NP  fluconazole (DIFLUCAN) 150 MG tablet Take 1 tablet (150 mg total) by mouth once. First on 12/21/2014, Repeat in one week later 12/11/14   Tresa Garter, MD  Ibuprofen-Diphenhydramine Cit (IBUPROFEN PM) 200-38 MG TABS Take 1 tablet by mouth at bedtime.    Historical Provider, MD  polyethylene glycol (MIRALAX) packet Take 17 g by mouth daily. Patient not taking: Reported on 12/11/2014 12/04/14   Annita Brod, MD  sulfamethoxazole-trimethoprim (BACTRIM DS,SEPTRA DS) 800-160 MG per tablet Take 1 tablet by mouth 2 (two) times daily. 12/11/14 12/21/14  Tresa Garter, MD      Objective:   Filed Vitals:   12/11/14 1039  BP: 129/63  Pulse: 92  Temp: 98.5 F (36.9 C)  TempSrc: Oral  Resp: 18  Height: 5\' 9"  (1.753 m)  Weight: 266 lb (120.657 kg)  SpO2: 98%    Exam General appearance : Awake, alert, not in any distress. Speech Clear. Not toxic looking, truncal obesity HEENT: Atraumatic and Normocephalic, pupils equally reactive to light and accomodation Neck: supple, no JVD. No cervical lymphadenopathy.  Chest:Good air entry bilaterally, no added sounds  CVS: S1 S2 regular, no murmurs.  Abdomen: Bowel sounds present, Non tender and not distended with no gaurding, rigidity or rebound. Extremities: B/L Lower Ext shows no edema, both legs are warm to touch Neurology: Awake alert, and oriented X 3, CN II-XII intact, Non focal Skin:  Bilateral axillary hydradenitis with few spots oozing pus  Data Review Lab Results  Component Value Date   HGBA1C 5.3 08/18/2011     Assessment & Plan   1. Iron deficiency anemia:  May be nutritional since there is no specific site or source of chronic blood loss.  Her thrombocytosis is possibly due to severe iron deficiency.  - Vit D  25 hydroxy (rtn osteoporosis monitoring) - Ambulatory referral to Hematology - TSH - Continue Ferrous Sulfate - Will await recommendation from hematologist for evaluation of chronic anemia  2. Neck strain, initial encounter  - Vit D  25 hydroxy (rtn osteoporosis monitoring)  3. Hydradenitis  - Ambulatory referral to Dermatology - Wound culture - sulfamethoxazole-trimethoprim (BACTRIM DS,SEPTRA DS) 800-160 MG per tablet; Take 1 tablet by mouth 2 (two) times daily.  Dispense: 20 tablet; Refill: 0 - fluconazole (DIFLUCAN) 150 MG tablet; Take 1 tablet (150 mg total) by mouth once. First on 12/21/2014, Repeat in one week later  Dispense: 2 tablet; Refill: 0  4. Dental caries  - Ambulatory referral to Dentistry  Patient have been counseled extensively about nutrition and  exercise  Return in about 3 months (around 03/12/2015), or if symptoms worsen or fail to improve, for Iron Deficiency Anemia, Follow up Pain and comorbidities.  The patient was given clear instructions to go to ER or return to medical center if symptoms don't improve, worsen or new problems develop. The patient verbalized understanding. The patient was told to call to get lab results if they haven't heard anything in the next week.   This note has been created with Surveyor, quantity. Any transcriptional errors are unintentional.    Angelica Chessman, MD, [place], [place], [place], [place] [facility] [place], [place]   12/11/2014, 11:20 AM

## 2014-12-11 NOTE — Patient Instructions (Signed)
Anemia, Nonspecific Anemia is a condition in which the concentration of red blood cells or hemoglobin in the blood is below normal. Hemoglobin is a substance in red blood cells that carries oxygen to the tissues of the body. Anemia results in not enough oxygen reaching these tissues.  CAUSES  Common causes of anemia include:   Excessive bleeding. Bleeding may be internal or external. This includes excessive bleeding from periods (in women) or from the intestine.   Poor nutrition.   Chronic kidney, thyroid, and liver disease.  Bone marrow disorders that decrease red blood cell production.  Cancer and treatments for cancer.  HIV, AIDS, and their treatments.  Spleen problems that increase red blood cell destruction.  Blood disorders.  Excess destruction of red blood cells due to infection, medicines, and autoimmune disorders. SIGNS AND SYMPTOMS   Minor weakness.   Dizziness.   Headache.  Palpitations.   Shortness of breath, especially with exercise.   Paleness.  Cold sensitivity.  Indigestion.  Nausea.  Difficulty sleeping.  Difficulty concentrating. Symptoms may occur suddenly or they may develop slowly.  DIAGNOSIS  Additional blood tests are often needed. These help your health care provider determine the best treatment. Your health care provider will check your stool for blood and look for other causes of blood loss.  TREATMENT  Treatment varies depending on the cause of the anemia. Treatment can include:   Supplements of iron, vitamin B12, or folic acid.   Hormone medicines.   A blood transfusion. This may be needed if blood loss is severe.   Hospitalization. This may be needed if there is significant continual blood loss.   Dietary changes.  Spleen removal. HOME CARE INSTRUCTIONS Keep all follow-up appointments. It often takes many weeks to correct anemia, and having your health care provider check on your condition and your response to  treatment is very important. SEEK IMMEDIATE MEDICAL CARE IF:   You develop extreme weakness, shortness of breath, or chest pain.   You become dizzy or have trouble concentrating.  You develop heavy vaginal bleeding.   You develop a rash.   You have bloody or black, tarry stools.   You faint.   You vomit up blood.   You vomit repeatedly.   You have abdominal pain.  You have a fever or persistent symptoms for more than 2-3 days.   You have a fever and your symptoms suddenly get worse.   You are dehydrated.  MAKE SURE YOU:  Understand these instructions.  Will watch your condition.  Will get help right away if you are not doing well or get worse. Document Released: 04/30/2004 Document Revised: 11/23/2012 Document Reviewed: 09/16/2012 ExitCare Patient Information 2015 [employer]. This information is not intended to replace advice given to you by your health care provider. Make sure you discuss any questions you have with your health care provider.  

## 2014-12-12 ENCOUNTER — Telehealth: Payer: Self-pay | Admitting: Hematology

## 2014-12-12 ENCOUNTER — Telehealth: Payer: Self-pay

## 2014-12-12 LAB — VITAMIN D 25 HYDROXY (VIT D DEFICIENCY, FRACTURES): Vit D, 25-Hydroxy: 5 ng/mL — ABNORMAL LOW (ref 30–100)

## 2014-12-12 NOTE — Telephone Encounter (Signed)
Lt mess regarding new patient appt. 9/13 at 1:30

## 2014-12-12 NOTE — Telephone Encounter (Signed)
Called and left message, I need to know what current insurance is to work on referrals. Thanks!

## 2014-12-13 LAB — WOUND CULTURE: GRAM STAIN: NONE SEEN

## 2014-12-18 ENCOUNTER — Ambulatory Visit (HOSPITAL_BASED_OUTPATIENT_CLINIC_OR_DEPARTMENT_OTHER): Payer: No Typology Code available for payment source | Admitting: Hematology

## 2014-12-18 ENCOUNTER — Other Ambulatory Visit: Payer: No Typology Code available for payment source

## 2014-12-18 ENCOUNTER — Encounter: Payer: Self-pay | Admitting: Hematology

## 2014-12-18 ENCOUNTER — Other Ambulatory Visit: Payer: Self-pay

## 2014-12-18 ENCOUNTER — Telehealth: Payer: Self-pay | Admitting: Hematology

## 2014-12-18 ENCOUNTER — Ambulatory Visit (HOSPITAL_BASED_OUTPATIENT_CLINIC_OR_DEPARTMENT_OTHER): Payer: No Typology Code available for payment source

## 2014-12-18 VITALS — BP 103/61 | HR 80 | Temp 98.1°F | Resp 18 | Ht 69.0 in | Wt 263.5 lb

## 2014-12-18 DIAGNOSIS — D509 Iron deficiency anemia, unspecified: Secondary | ICD-10-CM

## 2014-12-18 DIAGNOSIS — L732 Hidradenitis suppurativa: Secondary | ICD-10-CM

## 2014-12-18 LAB — CBC & DIFF AND RETIC
BASO%: 0.4 % (ref 0.0–2.0)
Basophils Absolute: 0 10*3/uL (ref 0.0–0.1)
EOS ABS: 0 10*3/uL (ref 0.0–0.5)
EOS%: 0.4 % (ref 0.0–7.0)
HCT: 31.4 % — ABNORMAL LOW (ref 34.8–46.6)
HGB: 9.5 g/dL — ABNORMAL LOW (ref 11.6–15.9)
IMMATURE RETIC FRACT: 5.6 % (ref 1.60–10.00)
LYMPH#: 1.6 10*3/uL (ref 0.9–3.3)
LYMPH%: 18.3 % (ref 14.0–49.7)
MCH: 20.3 pg — ABNORMAL LOW (ref 25.1–34.0)
MCHC: 30.3 g/dL — AB (ref 31.5–36.0)
MCV: 67 fL — ABNORMAL LOW (ref 79.5–101.0)
MONO#: 0.3 10*3/uL (ref 0.1–0.9)
MONO%: 3.6 % (ref 0.0–14.0)
NEUT%: 77.3 % — ABNORMAL HIGH (ref 38.4–76.8)
NEUTROS ABS: 6.6 10*3/uL — AB (ref 1.5–6.5)
NRBC: 0 % (ref 0–0)
PLATELETS: 527 10*3/uL — AB (ref 145–400)
RBC: 4.69 10*6/uL (ref 3.70–5.45)
RDW: 26 % — AB (ref 11.2–14.5)
RETIC CT ABS: 19.23 10*3/uL — AB (ref 33.70–90.70)
Retic %: 0.41 % — ABNORMAL LOW (ref 0.70–2.10)
WBC: 8.6 10*3/uL (ref 3.9–10.3)

## 2014-12-18 LAB — COMPREHENSIVE METABOLIC PANEL (CC13)
ALT: 8 U/L (ref 0–55)
ANION GAP: 6 meq/L (ref 3–11)
AST: 11 U/L (ref 5–34)
Albumin: 3.1 g/dL — ABNORMAL LOW (ref 3.5–5.0)
Alkaline Phosphatase: 80 U/L (ref 40–150)
BILIRUBIN TOTAL: 0.3 mg/dL (ref 0.20–1.20)
BUN: 9.4 mg/dL (ref 7.0–26.0)
CHLORIDE: 109 meq/L (ref 98–109)
CO2: 25 meq/L (ref 22–29)
CREATININE: 0.7 mg/dL (ref 0.6–1.1)
Calcium: 8.9 mg/dL (ref 8.4–10.4)
GLUCOSE: 90 mg/dL (ref 70–140)
Potassium: 4 mEq/L (ref 3.5–5.1)
SODIUM: 139 meq/L (ref 136–145)
TOTAL PROTEIN: 8.8 g/dL — AB (ref 6.4–8.3)

## 2014-12-18 LAB — CHCC SMEAR

## 2014-12-18 MED ORDER — CLINDAMYCIN PHOSPHATE 1 % EX GEL: Freq: Two times a day (BID) | CUTANEOUS | Status: DC

## 2014-12-18 MED ORDER — VITAMIN D (ERGOCALCIFEROL) 1.25 MG (50000 UNIT) PO CAPS: 50000.0000 [IU] | ORAL_CAPSULE | ORAL | Status: DC

## 2014-12-18 NOTE — Telephone Encounter (Signed)
Gave and printed appt sched and avs for pt for NOV...the patient did not want to sched iron today due to starting new job next week.

## 2014-12-18 NOTE — Progress Notes (Signed)
[facility]    HEMATOLOGY/ONCOLOGY CONSULTATION NOTE  Date of Service: 12/18/2014  Patient Care Team: Tresa Garter, MD as PCP - General (Internal Medicine)  CHIEF COMPLAINTS/PURPOSE OF CONSULTATION:  Evaluation and management of anemia  HISTORY OF PRESENTING ILLNESS:  Amanda Davenport is a wonderful 34 y.o. female who has been referred to [place] by Dr .Angelica Chessman, MD for evaluation and management of Iron deficiency Anemia.  Patient with history of severe hidradenitis, obesity, chronic anemia, WPW syndrome who has been referred to [place] for management of anemia.  Patient notes that she has had recurrent iron deficiency anemia for "years".  She notes that she  Has been treated with IV iron in 2013 and then required PRBC transfusion in 2014.  She notes she had a colonoscopy in 2014 that was unrevealing. She notes that she has been taking over-the-counter iron (FeSo4)  one tablet daily for years.  Denies having had an EGD  Or  capsule endoscopy. Recently has been on naproxen daily for about a month for neck pains. Notes presence of external hemorrhoids with some bleeding. She was admitted to the hospital with microcytic anemia hemoglobin 6.5 MCV 61.7 about 3 weeks ago on 12/03/2014 and received 2 units of blood.  Hemoglobin today is 9.5 with MCV of 67, WBC count 8.6 and platelet count of 527.  Notes that she has periods for about 5-7 days per month and passes clots.   She notes that she is unsure whether she should call her periods heavy.  Denies excessive alcohol use. Notes that her mother was adopted and does not know much about her family history.    Currently not having any change in bowel habits, abdominal pain, unexplained weight loss.  She does have some intermittent blood in the stools from hemorrhoidal bleeding. No fevers chills or night sweats or unexplained weight loss.  Note significant fatigue. Brittle nails and some hair loss.  No overt pica symptoms  MEDICAL HISTORY:  Past  Medical History  Diagnosis Date  . Allergy   . Anemia   . Recurrent boils   . Nearsightedness     wears glasses  . Chronic headache   . Arrhythmia   . WPW (Wolff-Parkinson-White syndrome)   . Obesity    . Patient Active Problem List   Diagnosis Date Noted  . Neck strain 12/11/2014  . Hydradenitis 12/11/2014  . Dental caries 12/11/2014  . Constipation 12/03/2014  . Neck pain 12/03/2014  . Myalgia and myositis 11/27/2014  . 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 Morbid obesity 11/27/2014  . Hematochezia 12/16/2012  . Wolff-Parkinson-White (WPW) syndrome 03/01/2012  . Iron deficiency anemia 02/16/2012  . Tachycardia 01/28/2012  . Hidradenitis suppurativa 08/18/2011  . AXILLARY ABSCESS 12/30/2006  . ANKLE PAIN 10/11/2006     SURGICAL HISTORY: Past Surgical History  Procedure Laterality Date  . Cystectomy      tonsils    SOCIAL HISTORY: Social History   Social History  . Marital Status: Single    Spouse Name: N/A  . Number of Children: N/A  . Years of Education: N/A   Occupational History  . Not on file.   Social History Main Topics  . Smoking status: Former Smoker -- .5 years    Types: Cigarettes  . Smokeless tobacco: Never Used     Comment: smokes black and milds  . Alcohol Use: No  . Drug Use: No  . Sexual Activity: Not on file   Other Topics Concern  . Not on file  Social History Narrative    FAMILY HISTORY: Family History  Problem Relation Age of Onset  . Breast cancer Mother   . Pulmonary embolism Mother     died of PE  . Diabetes Paternal Grandmother   . Heart disease Neg Hx   . Stroke Neg Hx   . Colon cancer Mother   . Irritable bowel syndrome Mother     ALLERGIES:  is allergic to other; penicillins; shellfish-derived products; and shrimp.  MEDICATIONS:  Current Outpatient Prescriptions  Medication Sig Dispense Refill  . diazepam (VALIUM) 5 MG tablet Take 1 tablet (5 mg total) by mouth every 12 (twelve) hours as needed for  muscle spasms. 10 tablet 0  . ferrous sulfate 325 (65 FE) MG tablet Take 1 tablet (325 mg total) by mouth daily with breakfast. 1 tablet 3  . fluconazole (DIFLUCAN) 150 MG tablet Take 1 tablet (150 mg total) by mouth once. First on 12/21/2014, Repeat in one week later 2 tablet 0  . Ibuprofen-Diphenhydramine Cit (IBUPROFEN PM) 200-38 MG TABS Take 1 tablet by mouth at bedtime.    . methocarbamol (ROBAXIN) 500 MG tablet Take 1 tablet (500 mg total) by mouth 2 (two) times daily. 20 tablet 0  . methylPREDNISolone (MEDROL DOSEPAK) 4 MG TBPK tablet Use as directed 21 tablet 0  . polyethylene glycol (MIRALAX) packet Take 17 g by mouth daily. (Patient not taking: Reported on 12/11/2014) 14 each 0  . sulfamethoxazole-trimethoprim (BACTRIM DS,SEPTRA DS) 800-160 MG per tablet Take 1 tablet by mouth 2 (two) times daily. 20 tablet 0  . traMADol (ULTRAM) 50 MG tablet Take 1 tablet (50 mg total) by mouth every 6 (six) hours as needed. (Patient taking differently: Take 50 mg by mouth every 6 (six) hours as needed for moderate pain. ) 30 tablet 0  . Vitamin D, Ergocalciferol, (DRISDOL) 50000 UNITS CAPS capsule Take 1 capsule (50,000 Units total) by mouth every 7 (seven) days. 30 capsule 1  . [DISCONTINUED] cetirizine (ZYRTEC ALLERGY) 10 MG tablet Take 1 tablet (10 mg total) by mouth daily. 30 tablet 1  . [DISCONTINUED] diphenhydramine-acetaminophen (TYLENOL PM) 25-500 MG TABS Take 2 tablets by mouth at bedtime as needed (sleep).     . [DISCONTINUED] fluticasone (FLONASE) 50 MCG/ACT nasal spray Place 2 sprays into both nostrils daily. 16 g 1  . [DISCONTINUED] mupirocin nasal ointment (BACTROBAN) 2 % Apply to affected area BID x 7 days (Patient not taking: Reported on 09/12/2014) 1 g 0   No current facility-administered medications for this visit.    REVIEW OF SYSTEMS:    10 Point review of Systems was done is negative except as noted above.  PHYSICAL EXAMINATION: ECOG PERFORMANCE STATUS: 1 - Symptomatic but  completely ambulatory  . Filed Vitals:   12/18/14 1346  Height: 5\' 9"  (1.753 m)  Weight: 263 lb 8 oz (119.523 kg)   Filed Weights   12/18/14 1346  Weight: 263 lb 8 oz (119.523 kg)   .Body mass index is 38.89 kg/(m^2).  GENERAL:alert, in no acute distress and comfortable SKIN: skin color, texture, turgor are normal, no rashes or significant lesions EYES: normal, conjunctiva are pink and non-injected, sclera clear OROPHARYNX:no exudate, no erythema and lips, buccal mucosa, and tongue normal  NECK: supple, no JVD, thyroid normal size, non-tender, without nodularity LYMPH:  no palpable lymphadenopathy in the cervical, axillary or inguinal LUNGS: clear to auscultation with normal respiratory effort HEART: regular rate & rhythm,  no murmurs and no lower extremity edema ABDOMEN: abdomen obese, soft, non-tender,  normoactive bowel sounds  Musculoskeletal: no cyanosis of digits and no clubbing  PSYCH: alert & oriented x 3 with fluent speech NEURO: no focal motor/sensory deficits  LABORATORY DATA:  I have reviewed the data as listed  . CBC Latest Ref Rng 12/18/2014 12/04/2014 12/03/2014  WBC 3.9 - 10.3 10e3/uL 8.6 - 8.5  Hemoglobin 11.6 - 15.9 g/dL 9.5(L) 8.0(L) 6.5(LL)  Hematocrit 34.8 - 46.6 % 31.4(L) 27.4(L) 23.3(L)  Platelets 145 - 400 10e3/uL 527(H) - 655(H)    . CMP Latest Ref Rng 12/18/2014 12/03/2014 11/27/2014  Glucose 70 - 140 mg/dl 90 94 57(L)  BUN 7.0 - 26.0 mg/dL 9.4 12 8   Creatinine 0.6 - 1.1 mg/dL 0.7 0.89 0.75  Sodium 136 - 145 mEq/L 139 136 140  Potassium 3.5 - 5.1 mEq/L 4.0 3.7 3.9  Chloride 101 - 111 mmol/L - 107 104  CO2 22 - 29 mEq/L 25 25 23   Calcium 8.4 - 10.4 mg/dL 8.9 8.2(L) 8.2(L)  Total Protein 6.4 - 8.3 g/dL 8.8(H) - 7.8  Total Bilirubin 0.20 - 1.20 mg/dL 0.30 - 0.2  Alkaline Phos 40 - 150 U/L 80 - 67  AST 5 - 34 U/L 11 - 11  ALT 0 - 55 U/L 8 - 6   . Lab Results  Component Value Date   IRON 17* 12/18/2014   TIBC 304 12/18/2014   IRONPCTSAT 5*  12/18/2014   (Iron and TIBC)  Lab Results  Component Value Date   FERRITIN 17 12/18/2014    Component     Latest Ref Rng 12/18/2014  Vitamin B-12     211 - 911 pg/mL 361  RBC Folate     >280 ng/mL 632  Sed Rate     0 - 20 mm/hr 53 (H)  CRP     <0.60 mg/dL 4.0 (H)     RADIOGRAPHIC STUDIES: I have personally reviewed the radiological images as listed and agreed with the findings in the report. Mr Cervical Spine Wo Contrast  11/30/2014   CLINICAL DATA:  Myalgia and myositis. Atlantoaxial torticollis. Neck pain and stiffness  EXAM: MRI CERVICAL SPINE WITHOUT CONTRAST  TECHNIQUE: Multiplanar, multisequence MR imaging of the cervical spine was performed. No intravenous contrast was administered.  COMPARISON:  None.  FINDINGS: Suboptimal image quality on this study.  Normal alignment with straightening of the cervical lordosis. Negative for fracture or mass lesion. Bone marrow signal is normal. Cervical medullary junction normal. Cervical spinal cord signal normal.  C1-C2 appears normal in alignment. No effusion or soft tissue thickening.  Negative for disc degeneration. No disc protrusion or spurring. No significant spinal stenosis.  IMPRESSION: Negative   Electronically Signed   By: Franchot Gallo M.D.   On: 11/30/2014 20:14    ASSESSMENT & PLAN:   34 year old African American female with  #1 microcytic anemia - likely due to iron deficiency.  Unclear etiology possibly chronic hemorrhoidal bleeding versus heavy periods versus other GI losses (?NSAIDS associated ulceration). Additional  Elements causing anemia include chronic inflammatory state due to hydradenitis suppurativa.  Given the relatively high RBC numbers for the given hemoglobin cannot rule out underlying thalassemia/hemoglobinopathy. Plan -We'll treat the patient aggressively with IV iron given the ferritin and iron saturations are low suggesting iron deficiency.  Her iron deficiency is likely be underestimated due to the  fact that the ferritin is an acute phase reactant.  Plan -We'll plan to treat the patient was IV feraheme to correct her iron deficiency -patient will be seeing dermatology for evaluation  and management for hydradenitis to reduce chronic systemic inflammation. -Followup with primary care physician to complete GI workup with EGD and if needed capsule endoscopy and management of hemorrhoids and possible gyn evaluation of heavy periods. Avoid NSAIDS due to increased risk of ulceration/gi bleeding. -will check Peripheral blood smear -Hemoglobin electrophoresis upon return visit after iron replacement.  Return to care with Dr. Irene Limbo in 8 weeks with CBC, CMP, B12, ferritin, iron profile . All of the patients questions were answered to her apparent satisfaction. The patient knows to call the clinic with any problems, questions or concerns.  I spent 40 minutes counseling the patient face to face. The total time spent in the appointment was 60 minutes and more than 50% was on counseling and direct patient cares.    Sullivan Lone MD [place] AAHIVMS [facility] Hematology/Oncology Physician [facility]  (Office):       [phone] (Work cell):  [phone] (Fax):           [phone]  12/18/2014 2:07 PM

## 2014-12-19 LAB — C-REACTIVE PROTEIN: CRP: 4 mg/dL — ABNORMAL HIGH (ref <0.60)

## 2014-12-19 LAB — SEDIMENTATION RATE: Sed Rate: 53 mm/hr — ABNORMAL HIGH (ref 0–20)

## 2014-12-19 LAB — VITAMIN B12: VITAMIN B 12: 361 pg/mL (ref 211–911)

## 2014-12-19 LAB — IRON AND TIBC CHCC
%SAT: 5 % — AB (ref 21–57)
Iron: 17 ug/dL — ABNORMAL LOW (ref 41–142)
TIBC: 304 ug/dL (ref 236–444)
UIBC: 287 ug/dL (ref 120–384)

## 2014-12-19 LAB — FERRITIN CHCC: FERRITIN: 17 ng/mL (ref 9–269)

## 2014-12-19 LAB — FOLATE RBC: RBC Folate: 632 ng/mL (ref >280)

## 2014-12-21 ENCOUNTER — Telehealth: Payer: Self-pay

## 2014-12-21 ENCOUNTER — Other Ambulatory Visit: Payer: Self-pay | Admitting: Internal Medicine

## 2014-12-21 NOTE — Telephone Encounter (Signed)
-----   Message from Tresa Garter, MD sent at 12/21/2014  9:59 AM EDT ----- Please inform patient that her laboratory tests shows very low vitamin D level, wound culture shows multiple organisms, continue antibiotics as prescribed. We will also start patient on vitamin D capsule once weekly. Ordered.

## 2014-12-21 NOTE — Telephone Encounter (Signed)
Called and spoke with Patient advised to pick up vitamin D and take as directed for low vitamin D levels. Patient states she had not started antibiotics yet but I advised her to pick up and take as directed until gone due to wound culture results. Patient verbalized understanding and had no further  Questions at this time. Thanks!

## 2015-02-12 ENCOUNTER — Ambulatory Visit: Payer: Self-pay | Admitting: Hematology

## 2015-02-19 ENCOUNTER — Ambulatory Visit: Payer: Self-pay | Admitting: Family Medicine

## 2015-03-29 ENCOUNTER — Encounter (HOSPITAL_COMMUNITY): Payer: Self-pay | Admitting: Emergency Medicine

## 2015-03-29 ENCOUNTER — Emergency Department (HOSPITAL_COMMUNITY)
Admission: EM | Admit: 2015-03-29 | Discharge: 2015-03-29 | Disposition: A | Discharge status: Home / Self Care | Payer: No Typology Code available for payment source | Attending: Emergency Medicine | Admitting: Emergency Medicine

## 2015-03-29 DIAGNOSIS — Z87891 Personal history of nicotine dependence: Secondary | ICD-10-CM | POA: Insufficient documentation

## 2015-03-29 DIAGNOSIS — N898 Other specified noninflammatory disorders of vagina: Secondary | ICD-10-CM

## 2015-03-29 DIAGNOSIS — Z973 Presence of spectacles and contact lenses: Secondary | ICD-10-CM | POA: Insufficient documentation

## 2015-03-29 DIAGNOSIS — Z880 Allergy status to penicillin: Secondary | ICD-10-CM | POA: Insufficient documentation

## 2015-03-29 DIAGNOSIS — Z3202 Encounter for pregnancy test, result negative: Secondary | ICD-10-CM | POA: Insufficient documentation

## 2015-03-29 DIAGNOSIS — K625 Hemorrhage of anus and rectum: Secondary | ICD-10-CM | POA: Insufficient documentation

## 2015-03-29 DIAGNOSIS — Z862 Personal history of diseases of the blood and blood-forming organs and certain disorders involving the immune mechanism: Secondary | ICD-10-CM | POA: Insufficient documentation

## 2015-03-29 DIAGNOSIS — Z8679 Personal history of other diseases of the circulatory system: Secondary | ICD-10-CM | POA: Insufficient documentation

## 2015-03-29 DIAGNOSIS — L732 Hidradenitis suppurativa: Secondary | ICD-10-CM | POA: Insufficient documentation

## 2015-03-29 DIAGNOSIS — Z872 Personal history of diseases of the skin and subcutaneous tissue: Secondary | ICD-10-CM | POA: Insufficient documentation

## 2015-03-29 DIAGNOSIS — E669 Obesity, unspecified: Secondary | ICD-10-CM | POA: Insufficient documentation

## 2015-03-29 DIAGNOSIS — K5900 Constipation, unspecified: Secondary | ICD-10-CM | POA: Insufficient documentation

## 2015-03-29 DIAGNOSIS — K602 Anal fissure, unspecified: Secondary | ICD-10-CM | POA: Insufficient documentation

## 2015-03-29 DIAGNOSIS — G8929 Other chronic pain: Secondary | ICD-10-CM | POA: Insufficient documentation

## 2015-03-29 LAB — URINE MICROSCOPIC-ADD ON: BACTERIA UA: NONE SEEN

## 2015-03-29 LAB — POC OCCULT BLOOD, ED: Fecal Occult Bld: NEGATIVE

## 2015-03-29 LAB — URINALYSIS, ROUTINE W REFLEX MICROSCOPIC
Bilirubin Urine: NEGATIVE
Glucose, UA: NEGATIVE mg/dL
Ketones, ur: NEGATIVE mg/dL
Leukocytes, UA: NEGATIVE
Nitrite: NEGATIVE
Protein, ur: NEGATIVE mg/dL
SPECIFIC GRAVITY, URINE: 1.022 (ref 1.005–1.030)
pH: 6 (ref 5.0–8.0)

## 2015-03-29 LAB — WET PREP, GENITAL
Sperm: NONE SEEN
TRICH WET PREP: NONE SEEN
YEAST WET PREP: NONE SEEN

## 2015-03-29 LAB — POC URINE PREG, ED: PREG TEST UR: NEGATIVE

## 2015-03-29 MED ORDER — HYDROCORTISONE 2.5 % RE CREA: TOPICAL_CREAM | RECTAL | Status: DC

## 2015-03-29 MED ORDER — FLUCONAZOLE 150 MG PO TABS: 150.0000 mg | ORAL_TABLET | Freq: Once | ORAL | Status: DC

## 2015-03-29 MED ORDER — CEPHALEXIN 500 MG PO CAPS: 500.0000 mg | ORAL_CAPSULE | Freq: Four times a day (QID) | ORAL | Status: DC

## 2015-03-29 MED ORDER — POLYETHYLENE GLYCOL 3350 17 GM/SCOOP PO POWD: 17.0000 g | Freq: Two times a day (BID) | ORAL | Status: DC

## 2015-03-29 NOTE — Discharge Instructions (Signed)
1. Medications: Keflex, MiraLAX, Anusol, usual home medications 2. Treatment: rest, drink plenty of fluids,  3. Follow Up: Please followup with your primary doctor in 2-3 days for discussion of your diagnoses and further evaluation after today's visit; if you do not have a primary care doctor use the resource guide provided to find one; Please return to the ER for worsening symptoms, fevers or other concerns    Anal Fissure, Adult An anal fissure is a small tear or crack in the skin around the anus. Bleeding from a fissure usually stops on its own within a few minutes. However, bleeding will often occur again with each bowel movement until the crack heals. CAUSES This condition may be caused by:  Passing large, hard stool (feces).  Frequent diarrhea.  Constipation.  Inflammatory bowel disease (Crohn disease or ulcerative colitis).  Infections.  Anal sex. SYMPTOMS Symptoms of this condition include:  Bleeding from the rectum.  Small amounts of blood seen on your stool, on toilet paper, or in the toilet after a bowel movement.  Painful bowel movements.  Itching or irritation around the anus. DIAGNOSIS A health care provider may diagnose this condition by closely examining the anal area. An anal fissure can usually be seen with careful inspection. In some cases, a rectal exam may be performed, or a short tube (anoscope) may be used to examine the anal canal. TREATMENT Treatment for this condition may include:  Taking steps to avoid constipation. This may include making changes to your diet, such as increasing your intake of fiber or fluid.  Taking fiber supplements. These supplements can soften your stool to help make bowel movements easier. Your health care provider may also prescribe a stool softener if your stool is often hard.  Taking sitz baths. This may help to heal the tear.  Using medicated creams or ointments. These may be prescribed to lessen discomfort. HOME CARE  INSTRUCTIONS Eating and Drinking  Avoid foods that may be constipating, such as bananas and dairy products.  Drink enough fluid to keep your urine clear or pale yellow.  Maintain a diet that is high in fruits, whole grains, and vegetables. General Instructions  Keep the anal area as clean and dry as possible.  Take sitz baths as told by your health care provider. Do not use soap in the sitz baths.  Take over-the-counter and prescription medicines only as told by your health care provider.  Use creams or ointments only as told by your health care provider.  Keep all follow-up visits as told by your health care provider. This is important. SEEK MEDICAL CARE IF:  You have more bleeding.  You have a fever.  You have diarrhea that is mixed with blood.  You continue to have pain.  Your problem is getting worse rather than better.   This information is not intended to replace advice given to you by your health care provider. Make sure you discuss any questions you have with your health care provider.   Document Released: 03/23/2005 Document Revised: 12/12/2014 Document Reviewed: 06/18/2014 Elsevier Interactive Patient Education [employer].

## 2015-03-29 NOTE — ED Provider Notes (Signed)
CSN: [id number]     Arrival date & time 03/29/15  1213 History   First MD Initiated Contact with Patient 03/29/15 1300     Chief Complaint  Patient presents with  . Hemorrhoids  . Vaginal Discharge  . Vaginal Itching     (Consider location/radiation/quality/duration/timing/severity/associated sxs/prior Treatment) The history is provided by the patient and medical records. No language interpreter was used.     Amanda Davenport is a 34 y.o. female  with a hx of anemia, hidradenitis, WPW, obesity presents to the Emergency Department complaining of gradual, persistent, progressively worsening vaginal itching onset approximately 1 week ago. Patient reports she has a history of hidradenitis which usually presents with persistent itching but this episode seems to be lasting longer than usual. She denies fever, chills, headache, neck pain, chest pain, abdominal pain, nausea, vomiting, diarrhea weakness, dizziness, syncope. Patient also endorses rectal bleeding only after straining for bowel movement. She reports mild constipation for the last several weeks and noticing an increase in the blood in the last several days. She reports that she occasionally sees blood on the outside of her formed stool was also seeing it on the toilet paper. She reports intermittent rectal pain with bowel movement but reports no rectal or perineal pain not defecating. Nothing seems to make her symptoms better or worse. Patient reports using Clindagel on her hidradenitis.   Past Medical History  Diagnosis Date  . Allergy   . Anemia   . Recurrent boils   . Nearsightedness     wears glasses  . Chronic headache   . Arrhythmia   . WPW (Wolff-Parkinson-White syndrome)   . Obesity    Past Surgical History  Procedure Laterality Date  . Cystectomy      tonsils   Family History  Problem Relation Age of Onset  . Breast cancer Mother   . Pulmonary embolism Mother     died of PE  . Diabetes Paternal Grandmother   .  Heart disease Neg Hx   . Stroke Neg Hx   . Colon cancer Mother   . Irritable bowel syndrome Mother    Social History  Substance Use Topics  . Smoking status: Former Smoker -- .5 years    Types: Cigarettes  . Smokeless tobacco: Never Used     Comment: smokes black and milds  . Alcohol Use: No   OB History    Gravida Para Term Preterm AB TAB SAB Ectopic Multiple Living   0              Review of Systems  Constitutional: Negative for fever, diaphoresis, appetite change, fatigue and unexpected weight change.  HENT: Negative for mouth sores.   Eyes: Negative for visual disturbance.  Respiratory: Negative for cough, chest tightness, shortness of breath and wheezing.   Cardiovascular: Negative for chest pain.  Gastrointestinal: Positive for constipation, anal bleeding and rectal pain (with defecation only). Negative for nausea, vomiting, abdominal pain and diarrhea.  Endocrine: Negative for polydipsia, polyphagia and polyuria.  Genitourinary: Negative for dysuria, urgency, frequency and hematuria.       Vaginal itching  Musculoskeletal: Negative for back pain and neck stiffness.  Skin: Negative for rash.  Allergic/Immunologic: Negative for immunocompromised state.  Neurological: Negative for syncope, light-headedness and headaches.  Hematological: Does not bruise/bleed easily.  Psychiatric/Behavioral: Negative for sleep disturbance. The patient is not nervous/anxious.       Allergies  Other; Penicillins; Shellfish-derived products; and Shrimp  Home Medications   Prior to Admission medications  Medication Sig Start Date End Date Taking? Authorizing Provider  fluticasone (FLONASE) 50 MCG/ACT nasal spray Place 2 sprays into both nostrils daily as needed for allergies or rhinitis.   Yes Historical Provider, MD  miconazole (MONISTAT 7) 2 % vaginal cream Place 1 Applicatorful vaginally daily as needed (yeast).   Yes Historical Provider, MD  naproxen sodium (ANAPROX) 220 MG tablet  Take 440 mg by mouth daily as needed (pain).   Yes Historical Provider, MD  triamcinolone cream (KENALOG) 0.1 % Apply 1 application topically 2 (two) times daily as needed (dry skin).   Yes Historical Provider, MD  Vitamin D, Ergocalciferol, (DRISDOL) 50000 UNITS CAPS capsule Take 1 capsule (50,000 Units total) by mouth every 7 (seven) days. 12/18/14  Yes Olugbemiga Essie Christine, MD  cephALEXin (KEFLEX) 500 MG capsule Take 1 capsule (500 mg total) by mouth 4 (four) times daily. 03/29/15   Evonna Stoltz, PA-C  fluconazole (DIFLUCAN) 150 MG tablet Take 1 tablet (150 mg total) by mouth once. First on 12/21/2014, Repeat in one week later 03/29/15   Jarrett Soho Erek Kowal, PA-C  hydrocortisone (ANUSOL-HC) 2.5 % rectal cream Apply rectally 2 times daily 03/29/15   Jarrett Soho Druanne Bosques, PA-C  polyethylene glycol powder (GLYCOLAX/MIRALAX) powder Take 17 g by mouth 2 (two) times daily. 03/29/15   Adonus Uselman, PA-C   LMP 03/10/2015 Physical Exam  Constitutional: She appears well-developed and well-nourished. No distress.  HENT:  Head: Normocephalic and atraumatic.  Eyes: Conjunctivae are normal.  Neck: Normal range of motion.  Cardiovascular: Normal rate, regular rhythm, normal heart sounds and intact distal pulses.   No murmur heard. Pulmonary/Chest: Effort normal and breath sounds normal. No respiratory distress. She has no wheezes.  Abdominal: Soft. Bowel sounds are normal. There is no tenderness. There is no rebound and no guarding. Hernia confirmed negative in the right inguinal area and confirmed negative in the left inguinal area.  Genitourinary: Uterus normal. Rectal exam shows fissure. Rectal exam shows no external hemorrhoid, no internal hemorrhoid, no mass, no tenderness and anal tone normal. Guaiac negative stool. No labial fusion. There is no rash, tenderness or lesion on the right labia. There is no rash, tenderness or lesion on the left labia. Uterus is not deviated, not enlarged, not  fixed and not tender. Cervix exhibits no motion tenderness, no discharge and no friability. Right adnexum displays no mass, no tenderness and no fullness. Left adnexum displays no mass, no tenderness and no fullness. No erythema, tenderness or bleeding in the vagina. No foreign body around the vagina. No signs of injury around the vagina. Vaginal discharge (Small, thin, white, non-odorous) found.  Skin of the mons pubis and bilateral groin have evidence of multiple small abscesses consistent with hidradenitis flare; most of them are draining. Rectum with anal fissure at the 6:00 position without surrounding erythema or induration.  No tenderness to palpation of the perineum or rectal walls.  Musculoskeletal: Normal range of motion. She exhibits no edema.  Lymphadenopathy:       Right: No inguinal adenopathy present.       Left: No inguinal adenopathy present.  Neurological: She is alert.  Skin: Skin is warm and dry. She is not diaphoretic. No erythema.  Psychiatric: She has a normal mood and affect.  Nursing note and vitals reviewed.   ED Course  Procedures (including critical care time) Labs Review Labs Reviewed  WET PREP, GENITAL - Abnormal; Notable for the following:    Clue Cells Wet Prep HPF POC PRESENT (*)    WBC,  Wet Prep HPF POC MANY (*)    All other components within normal limits  URINALYSIS, ROUTINE W REFLEX MICROSCOPIC (NOT AT [facility]) - Abnormal; Notable for the following:    Hgb urine dipstick SMALL (*)    All other components within normal limits  URINE MICROSCOPIC-ADD ON - Abnormal; Notable for the following:    Squamous Epithelial / LPF 0-5 (*)    All other components within normal limits  POC OCCULT BLOOD, ED  POC URINE PREG, ED  GC/CHLAMYDIA PROBE AMP ([facility]) NOT AT [facility]    Imaging Review No results found. I have personally reviewed and evaluated these images and lab results as part of my medical decision-making.   EKG Interpretation None      MDM    Final diagnoses:  Anal fissure  Hydradenitis  Vaginal itching  Constipation, unspecified constipation type   Amanda Davenport presents with labial itching.  Exam consistent with hidradenitis flare. There is not a discrete abscess for which I&D would be beneficial. Patient was recently prescribed topical clindamycin however she reports she could not afford this.  Recommended Bactrim and Keflex. Patient reports that though she has never had an allergic reaction to Bactrim in makes her stomach hurt badly and she wishes just to try the Keflex. I explained that this would not cover all of the potential bacteria and she states understanding wishing to only take Keflex.  Patient also with anal fissure, most likely the source of her rectal bleeding. No evidence of infection or abscess. Fetal occult was negative.  Urinalysis without evidence of urinary tract infection. Wet prep with many white blood cells however patient gives low risk history for STD. Discussed potential for STD but pending gonorrhea and chlamydia cultures. Patient is comfortable with delaying treatment to await cultures.  Patient given Anusol for her rectal fissure, bowel hygiene and MiraLAX.  BP 109/80 mmHg  Pulse 91  Temp(Src) 98.2 F (36.8 C) (Oral)  Resp 16  SpO2 99%  LMP 03/10/2015   Abigail Butts, PA-C 03/29/15 1701  Harvel Quale, MD 04/02/15 [phone]

## 2015-03-29 NOTE — ED Notes (Signed)
Pt comes to ed with c/o of rectal bleeding and vaginal discharge. States vaginal discharge and itching is occuring. Pt has hx of hydritis.

## 2015-04-06 LAB — GC/CHLAMYDIA PROBE AMP (~~LOC~~) NOT AT ARMC
Chlamydia: NEGATIVE
NEISSERIA GONORRHEA: NEGATIVE

## 2015-05-13 ENCOUNTER — Ambulatory Visit: Payer: Self-pay | Admitting: Family Medicine

## 2015-05-17 ENCOUNTER — Encounter (HOSPITAL_COMMUNITY): Payer: Self-pay | Admitting: Emergency Medicine

## 2015-05-17 ENCOUNTER — Emergency Department (HOSPITAL_COMMUNITY)
Admission: EM | Admit: 2015-05-17 | Discharge: 2015-05-17 | Disposition: A | Discharge status: Home / Self Care | Payer: No Typology Code available for payment source | Attending: Emergency Medicine | Admitting: Emergency Medicine

## 2015-05-17 ENCOUNTER — Emergency Department (HOSPITAL_COMMUNITY): Payer: No Typology Code available for payment source

## 2015-05-17 DIAGNOSIS — Z862 Personal history of diseases of the blood and blood-forming organs and certain disorders involving the immune mechanism: Secondary | ICD-10-CM | POA: Insufficient documentation

## 2015-05-17 DIAGNOSIS — G8929 Other chronic pain: Secondary | ICD-10-CM | POA: Insufficient documentation

## 2015-05-17 DIAGNOSIS — Z880 Allergy status to penicillin: Secondary | ICD-10-CM | POA: Insufficient documentation

## 2015-05-17 DIAGNOSIS — Z79899 Other long term (current) drug therapy: Secondary | ICD-10-CM | POA: Insufficient documentation

## 2015-05-17 DIAGNOSIS — E669 Obesity, unspecified: Secondary | ICD-10-CM | POA: Insufficient documentation

## 2015-05-17 DIAGNOSIS — M722 Plantar fascial fibromatosis: Secondary | ICD-10-CM

## 2015-05-17 DIAGNOSIS — M79645 Pain in left finger(s): Secondary | ICD-10-CM | POA: Insufficient documentation

## 2015-05-17 DIAGNOSIS — Z87891 Personal history of nicotine dependence: Secondary | ICD-10-CM | POA: Insufficient documentation

## 2015-05-17 MED ORDER — HYDROCODONE-ACETAMINOPHEN 5-325 MG PO TABS: 1.0000 | ORAL_TABLET | Freq: Four times a day (QID) | ORAL | Status: DC | PRN

## 2015-05-17 MED ORDER — PREDNISONE 20 MG PO TABS: 40.0000 mg | ORAL_TABLET | Freq: Every day | ORAL | Status: DC

## 2015-05-17 MED FILL — predniSONE 20 MG TABS: 20 | 5 days supply | Qty: 10 | Fill #0

## 2015-05-17 NOTE — Discharge Instructions (Signed)
Joint Pain Joint pain, which is also called arthralgia, can be caused by many things. Joint pain often goes away when you follow your health care provider's instructions for relieving pain at home. However, joint pain can also be caused by conditions that require further treatment. Common causes of joint pain include:  Bruising in the area of the joint.  Overuse of the joint.  Wear and tear on the joints that occur with aging (osteoarthritis).  Various other forms of arthritis.  A buildup of a crystal form of uric acid in the joint (gout).  Infections of the joint (septic arthritis) or of the bone (osteomyelitis). Your health care provider may recommend medicine to help with the pain. If your joint pain continues, additional tests may be needed to diagnose your condition. HOME CARE INSTRUCTIONS Watch your condition for any changes. Follow these instructions as directed to lessen the pain that you are feeling.  Take medicines only as directed by your health care provider.  Rest the affected area for as long as your health care provider says that you should. If directed to do so, raise the painful joint above the level of your heart while you are sitting or lying down.  Do not do things that cause or worsen pain.  If directed, apply ice to the painful area:  Put ice in a plastic bag.  Place a towel between your skin and the bag.  Leave the ice on for 20 minutes, 2-3 times per day.  Wear an elastic bandage, splint, or sling as directed by your health care provider. Loosen the elastic bandage or splint if your fingers or toes become numb and tingle, or if they turn cold and blue.  Begin exercising or stretching the affected area as directed by your health care provider. Ask your health care provider what types of exercise are safe for you.  Keep all follow-up visits as directed by your health care provider. This is important. SEEK MEDICAL CARE IF:  Your pain increases, and medicine  does not help.  Your joint pain does not improve within 3 days.  You have increased bruising or swelling.  You have a fever.  You lose 10 lb (4.5 kg) or more without trying. SEEK IMMEDIATE MEDICAL CARE IF:  You are not able to move the joint.  Your fingers or toes become numb or they turn cold and blue.   This information is not intended to replace advice given to you by your health care provider. Make sure you discuss any questions you have with your health care provider.   Document Released: 03/23/2005 Document Revised: 04/13/2014 Document Reviewed: 01/02/2014 Elsevier Interactive Patient Education 2016 [employer] Plantar Fasciitis Plantar fasciitis is a painful foot condition that affects the heel. It occurs when the band of tissue that connects the toes to the heel bone (plantar fascia) becomes irritated. This can happen after exercising too much or doing other repetitive activities (overuse injury). The pain from plantar fasciitis can range from mild irritation to severe pain that makes it difficult for you to walk or move. The pain is usually worse in the morning or after you have been sitting or lying down for a while. CAUSES This condition may be caused by:  Standing for long periods of time.  Wearing shoes that do not fit.  Doing high-impact activities, including running, aerobics, and ballet.  Being overweight.  Having an abnormal way of walking (gait).  Having tight calf muscles.  Having high arches in your feet.  Starting a new athletic activity. SYMPTOMS The main symptom of this condition is heel pain. Other symptoms include:  Pain that gets worse after activity or exercise.  Pain that is worse in the morning or after resting.  Pain that goes away after you walk for a few minutes. DIAGNOSIS This condition may be diagnosed based on your signs and symptoms. Your health care provider will also do a physical exam to check for:  A tender area on the  bottom of your foot.  A high arch in your foot.  Pain when you move your foot.  Difficulty moving your foot. You may also need to have imaging studies to confirm the diagnosis. These can include:  X-rays.  Ultrasound.  MRI. TREATMENT  Treatment for plantar fasciitis depends on the severity of the condition. Your treatment may include:  Rest, ice, and over-the-counter pain medicines to manage your pain.  Exercises to stretch your calves and your plantar fascia.  A splint that holds your foot in a stretched, upward position while you sleep (night splint).  Physical therapy to relieve symptoms and prevent problems in the future.  Cortisone injections to relieve severe pain.  Extracorporeal shock wave therapy (ESWT) to stimulate damaged plantar fascia with electrical impulses. It is often used as a last resort before surgery.  Surgery, if other treatments have not worked after 12 months. HOME CARE INSTRUCTIONS  Take medicines only as directed by your health care provider.  Avoid activities that cause pain.  Roll the bottom of your foot over a bag of ice or a bottle of cold water. Do this for 20 minutes, 3-4 times a day.  Perform simple stretches as directed by your health care provider.  Try wearing athletic shoes with air-sole or gel-sole cushions or soft shoe inserts.  Wear a night splint while sleeping, if directed by your health care provider.  Keep all follow-up appointments with your health care provider. PREVENTION   Do not perform exercises or activities that cause heel pain.  Consider finding low-impact activities if you continue to have problems.  Lose weight if you need to. The best way to prevent plantar fasciitis is to avoid the activities that aggravate your plantar fascia. SEEK MEDICAL CARE IF:  Your symptoms do not go away after treatment with home care measures.  Your pain gets worse.  Your pain affects your ability to move or do your daily  activities.   This information is not intended to replace advice given to you by your health care provider. Make sure you discuss any questions you have with your health care provider.   Document Released: 12/16/2000 Document Revised: 12/12/2014 Document Reviewed: 01/31/2014 Elsevier Interactive Patient Education [employer].

## 2015-05-17 NOTE — ED Provider Notes (Signed)
CSN: [id number]     Arrival date & time 05/17/15  [id number] History  By signing my name below, I, Eustaquio Maize, attest that this documentation has been prepared under the direction and in the presence of Montine Circle, PA-C. Electronically Signed: Eustaquio Maize, ED Scribe. 05/17/2015. 9:13 AM.   Chief Complaint  Patient presents with  . Foot Pain  . Hand Pain   The history is provided by the patient. No language interpreter was used.     HPI Comments: Amanda Davenport is a 35 y.o. female who presents to the Emergency Department complaining of gradual onset, constant, moderate, right foot pain x 3 weeks. No known injury to the foot. Pt is unable to bear weight due to the pain. She has been taking Ibuprofen, Naproxen, and muscle relaxants without relief. Pt has also been soaking the foot in hot water and rolling frozen water bottle underneath the foot without relief. She also complains of left thumb pain and swelling x 1 month. No known injury to the thumb. Pt reports that she has been unable to bend the thumb due to the pain and swelling. She also notes intermittent paresthesias to the thumb. Denies weakness, numbness, or any other associated symptoms.   Past Medical History  Diagnosis Date  . Allergy   . Anemia   . Recurrent boils   . Nearsightedness     wears glasses  . Chronic headache   . Arrhythmia   . WPW (Wolff-Parkinson-White syndrome)   . Obesity    Past Surgical History  Procedure Laterality Date  . Cystectomy      tonsils   Family History  Problem Relation Age of Onset  . Breast cancer Mother   . Pulmonary embolism Mother     died of PE  . Diabetes Paternal Grandmother   . Heart disease Neg Hx   . Stroke Neg Hx   . Colon cancer Mother   . Irritable bowel syndrome Mother    Social History  Substance Use Topics  . Smoking status: Former Smoker -- .5 years    Types: Cigarettes  . Smokeless tobacco: Never Used     Comment: smokes black and milds  . Alcohol Use: No    OB History    Gravida Para Term Preterm AB TAB SAB Ectopic Multiple Living   0              Review of Systems  Musculoskeletal: Positive for joint swelling (Left thumb. ) and arthralgias (Right foot. Left thumb. ).  Neurological: Negative for weakness and numbness.   Allergies  Other; Penicillins; Shellfish-derived products; and Shrimp  Home Medications   Prior to Admission medications   Medication Sig Start Date End Date Taking? Authorizing Provider  cephALEXin (KEFLEX) 500 MG capsule Take 1 capsule (500 mg total) by mouth 4 (four) times daily. 03/29/15   Hannah Muthersbaugh, PA-C  fluconazole (DIFLUCAN) 150 MG tablet Take 1 tablet (150 mg total) by mouth once. First on 12/21/2014, Repeat in one week later 03/29/15   Jarrett Soho Muthersbaugh, PA-C  fluticasone [facility]) 50 MCG/ACT nasal spray Place 2 sprays into both nostrils daily as needed for allergies or rhinitis.    Historical Provider, MD  hydrocortisone (ANUSOL-HC) 2.5 % rectal cream Apply rectally 2 times daily 03/29/15   Jarrett Soho Muthersbaugh, PA-C  miconazole (MONISTAT 7) 2 % vaginal cream Place 1 Applicatorful vaginally daily as needed (yeast).    Historical Provider, MD  naproxen sodium (ANAPROX) 220 MG tablet Take 440 mg by mouth  daily as needed (pain).    Historical Provider, MD  polyethylene glycol powder (GLYCOLAX/MIRALAX) powder Take 17 g by mouth 2 (two) times daily. 03/29/15   Hannah Muthersbaugh, PA-C  triamcinolone cream (KENALOG) 0.1 % Apply 1 application topically 2 (two) times daily as needed (dry skin).    Historical Provider, MD  Vitamin D, Ergocalciferol, (DRISDOL) 50000 UNITS CAPS capsule Take 1 capsule (50,000 Units total) by mouth every 7 (seven) days. 12/18/14   Tresa Garter, MD   BP 111/67 mmHg  Pulse 94  Temp(Src) 98.4 F (36.9 C) (Oral)  Resp 16  SpO2 100%  LMP 05/01/2015 (Approximate)   Physical Exam  Physical Exam  Constitutional: Pt appears well-developed and well-nourished. No distress.   HENT:  Head: Normocephalic and atraumatic.  Eyes: Conjunctivae are normal.  Neck: Normal range of motion.  Cardiovascular: Normal rate, regular rhythm and intact distal pulses.   Capillary refill < 3 sec  Pulmonary/Chest: Effort normal and breath sounds normal.  Musculoskeletal: Pt exhibits tenderness along the plantar fascia of the right foot. No bony abnormality or deformity.  Left thumb mildly swollen at the DIP. ROM and strength 5/5. No erythema.  Pt exhibits no edema.  ROM: 5/5  Neurological: Pt  is alert. Coordination normal.  Sensation 5/5 Strength 5/5  Skin: Skin is warm and dry. Pt is not diaphoretic.  No tenting of the skin  Psychiatric: Pt has a normal mood and affect.  Nursing note and vitals reviewed.   ED Course  Procedures (including critical care time)  DIAGNOSTIC STUDIES: Oxygen Saturation is 100% on RA, normal by my interpretation.    COORDINATION OF CARE: 9:06 AM-Discussed treatment plan which includes insoles for shoes/referral to podiatrist and DG L Thumb with pt at bedside and pt agreed to plan.    Imaging Review Dg Finger Thumb Left  05/17/2015  CLINICAL DATA:  Left thumb pain for 2 months. Limited range of motion. No known injury. Initial encounter. EXAM: LEFT THUMB 2+V COMPARISON:  Plain films of the left hand 09/22/2013. FINDINGS: Soft tissues of the thumb appear swollen. There is osteopenia and the distal phalanx appears demineralized with likely cortical bone loss of the tuft on the radial side. There is no fracture or dislocation. IMPRESSION: Soft tissue swelling of the left thumb with demineralization of the distal phalanx are nonspecific findings. Question infection/osteomyelitis, inflammatory arthropathy or osteopenia related to disuse. Electronically Signed   By: Inge Rise M.D.   On: 05/17/2015 09:44       MDM   Final diagnoses:  Plantar fasciitis of right foot  Thumb pain, left   Patient with physical exam and history findings  consistent with plantar fasciitis. Will recommend follow-up with her primary care doctor and instructions to seek referral for orthopedics. Patient understands and agrees with plan. Left thumb has some inflammatory changes on x-ray. Image reviewed with Dr. Dolly Rias, who also saw the patient. Doubt septic joint or osteomyelitis. Consider gout. Will treat with prednisone. Again, recommend primary care follow-up for further workup and treatment and referral.  I personally performed the services described in this documentation, which was scribed in my presence. The recorded information has been reviewed and is accurate.        Montine Circle, PA-C 05/17/15 1024  Merrily Pew, MD 05/17/15 [phone]

## 2015-05-17 NOTE — ED Notes (Signed)
C/o right foot pain x 3 weeks. No known injury. ALSO, c/o left thumb pain x 1 month. No known injury,

## 2015-05-17 NOTE — ED Notes (Signed)
Dr. Dayna Barker in to see pt.

## 2015-05-20 ENCOUNTER — Ambulatory Visit (INDEPENDENT_AMBULATORY_CARE_PROVIDER_SITE_OTHER): Payer: No Typology Code available for payment source | Admitting: Family Medicine

## 2015-05-20 VITALS — BP 117/69 | HR 72 | Temp 98.4°F | Resp 16 | Ht 69.0 in | Wt 272.0 lb

## 2015-05-20 DIAGNOSIS — Z23 Encounter for immunization: Secondary | ICD-10-CM

## 2015-05-20 DIAGNOSIS — K029 Dental caries, unspecified: Secondary | ICD-10-CM

## 2015-05-20 DIAGNOSIS — L732 Hidradenitis suppurativa: Secondary | ICD-10-CM

## 2015-05-20 DIAGNOSIS — E669 Obesity, unspecified: Secondary | ICD-10-CM

## 2015-05-20 LAB — COMPLETE METABOLIC PANEL WITH GFR
ALT: 6 U/L (ref 6–29)
AST: 8 U/L — AB (ref 10–30)
Albumin: 2.9 g/dL — ABNORMAL LOW (ref 3.6–5.1)
Alkaline Phosphatase: 63 U/L (ref 33–115)
BILIRUBIN TOTAL: 0.2 mg/dL (ref 0.2–1.2)
BUN: 17 mg/dL (ref 7–25)
CHLORIDE: 106 mmol/L (ref 98–110)
CO2: 24 mmol/L (ref 20–31)
CREATININE: 0.73 mg/dL (ref 0.50–1.10)
Calcium: 8.3 mg/dL — ABNORMAL LOW (ref 8.6–10.2)
GFR, Est African American: >89 mL/min (ref >=60)
GFR, Est Non African American: >89 mL/min (ref >=60)
GLUCOSE: 77 mg/dL (ref 65–99)
Potassium: 3.7 mmol/L (ref 3.5–5.3)
Sodium: 140 mmol/L (ref 135–146)
TOTAL PROTEIN: 7.8 g/dL (ref 6.1–8.1)

## 2015-05-20 LAB — POCT URINALYSIS DIP (DEVICE)
BILIRUBIN URINE: NEGATIVE
Glucose, UA: NEGATIVE mg/dL
Ketones, ur: NEGATIVE mg/dL
LEUKOCYTES UA: NEGATIVE
NITRITE: NEGATIVE
PH: 5.5 (ref 5.0–8.0)
Protein, ur: NEGATIVE mg/dL
UROBILINOGEN UA: 0.2 mg/dL (ref 0.0–1.0)

## 2015-05-20 LAB — CBC WITH DIFFERENTIAL/PLATELET
BASOS ABS: 0 10*3/uL (ref 0.0–0.1)
Basophils Relative: 0 % (ref 0–1)
EOS PCT: 1 % (ref 0–5)
Eosinophils Absolute: 0.1 10*3/uL (ref 0.0–0.7)
HEMATOCRIT: 29.1 % — AB (ref 36.0–46.0)
Hemoglobin: 8.4 g/dL — ABNORMAL LOW (ref 12.0–15.0)
LYMPHS ABS: 4.6 10*3/uL — AB (ref 0.7–4.0)
LYMPHS PCT: 43 % (ref 12–46)
MCH: 19.9 pg — AB (ref 26.0–34.0)
MCHC: 28.9 g/dL — ABNORMAL LOW (ref 30.0–36.0)
MCV: 69 fL — AB (ref 78.0–100.0)
MONO ABS: 0.8 10*3/uL (ref 0.1–1.0)
MONOS PCT: 7 % (ref 3–12)
MPV: 7.7 fL — AB (ref 8.6–12.4)
Neutro Abs: 5.3 10*3/uL (ref 1.7–7.7)
Neutrophils Relative %: 49 % (ref 43–77)
Platelets: 633 10*3/uL — ABNORMAL HIGH (ref 150–400)
RBC: 4.22 MIL/uL (ref 3.87–5.11)
RDW: 18.5 % — AB (ref 11.5–15.5)
WBC: 10.8 10*3/uL — ABNORMAL HIGH (ref 4.0–10.5)

## 2015-05-20 LAB — HEMOGLOBIN A1C
Hgb A1c MFr Bld: 5.6 % (ref <5.7)
MEAN PLASMA GLUCOSE: 114 mg/dL (ref <117)

## 2015-05-20 NOTE — Progress Notes (Signed)
Subjective:    Patient ID: Amanda Davenport, female    DOB: 04/28/1980, 35 y.o.   MRN: [id number]  HPI Amanda Davenport, a 35 year old female with a history of hidradenitis suppurative. Patient presents for evaluation of wounds that are primarily located on axilla bilaterally.Onset was several months ago. Symptoms have gradually worsened.  Patient has a history of draining wounds. Patient does not have diabetes.Amanda Davenport reports fatigue.  She denies fever, chest pains, shortness of breath, dysuria, nausea, vomiting, or diarrhea.   Past Medical History  Diagnosis Date  . Allergy   . Anemia   . Recurrent boils   . Nearsightedness     wears glasses  . Chronic headache   . Arrhythmia   . WPW (Wolff-Parkinson-White syndrome)   . Obesity    Immunization History  Administered Date(s) Administered  . Tdap 05/20/2015   Social History   Social History Narrative   Past Surgical History  Procedure Laterality Date  . Cystectomy      tonsils   Review of Systems  Constitutional: Negative.   HENT: Negative.   Eyes: Negative.   Respiratory: Negative.  Negative for shortness of breath and stridor.   Cardiovascular: Negative.   Gastrointestinal: Negative.   Endocrine: Negative.   Genitourinary: Negative.   Musculoskeletal: Negative.   Skin: Negative.        Draining wounds to axilla bilaterally.   Allergic/Immunologic: Negative.   Neurological: Negative.   Hematological: Negative.   Psychiatric/Behavioral: Negative.        Objective:   Physical Exam  Constitutional: She is oriented to person, place, and time.  HENT:  Head: Normocephalic and atraumatic.  Right Ear: External ear normal.  Left Ear: External ear normal.  Nose: Nose normal.  Mouth/Throat: Oropharynx is clear and moist.  Eyes: Conjunctivae and EOM are normal. Pupils are equal, round, and reactive to light.  Neck: Normal range of motion. Neck supple.  Pulmonary/Chest: Effort normal and breath sounds normal.   Abdominal: Soft. Bowel sounds are normal.  Abdomen pendulous  Neurological: She is alert and oriented to person, place, and time. She has normal reflexes.  Skin: Skin is warm and dry.  Draining wounds to axilla bilaterally- Moderate drainage, tan, erythema  Psychiatric: She has a normal mood and affect. Her behavior is normal. Thought content normal.     BP 117/69 mmHg  Pulse 72  Temp(Src) 98.4 F (36.9 C) (Oral)  Resp 16  Ht 5\' 9"  (1.753 m)  Wt 272 lb (123.378 kg)  BMI 40.15 kg/m2  LMP 05/01/2015 (Approximate) Assessment & Plan:  1. Hidradenitis suppurativa Clean area with Dial antibacterial soap. Will discuss laboratory results as they become available.  - Wound culture - Wound culture - Ambulatory referral to General Surgery - CBC with Differential  2. Obesity I suspect metabolic syndrome. Current BMI is 40. Recommend a lowfat, low carbohydrate diet divided over 5-6 small meals, increase water intake to 6-8 glasses, and 150 minutes per week of cardiovascular exercise. She has been counseled extensively for diet and exercise.   - Hemoglobin A1c - COMPLETE METABOLIC PANEL WITH GFR - TSH - POCT urinalysis dipstick - POCT urinalysis dip (device)  3. Dental caries  - Ambulatory referral to Dentistry  4. Need for Tdap vaccination - Tdap vaccine greater than or equal to 7yo IM    The patient was given clear instructions to go to ER or return to medical center if symptoms do not improve, worsen or new problems develop. The  patient verbalized understanding. Will notify patient with laboratory results.     [name], FNP

## 2015-05-20 NOTE — Patient Instructions (Signed)
Will follow-up by phone with laboratory results.  Will send all medications to [facility] following appt.   Hidradenitis Suppurativa Hidradenitis suppurativa is a long-term (chronic) skin disease that starts with blocked sweat glands or hair follicles. Bacteria may grow in these blocked openings of your skin. Hidradenitis suppurativa is like a severe form of acne that develops in areas of your body where acne would be unusual. It is most likely to affect the areas of your body where skin rubs against skin and becomes moist. This includes your:  Underarms.  Groin.  Genital areas.  Buttocks.  Upper thighs.  Breasts. Hidradenitis suppurativa may start out with small pimples. The pimples can develop into deep sores that break open (rupture) and drain pus. Over time your skin may thicken and become scarred. Hidradenitis suppurativa cannot be passed from person to person.  CAUSES  The exact cause of hidradenitis suppurativa is not known. This condition may be due to:  Female and female hormones. The condition is rare before and after puberty.  An overactive body defense system (immune system). Your immune system may overreact to the blocked hair follicles or sweat glands and cause swelling and pus-filled sores. RISK FACTORS You may have a higher risk of hidradenitis suppurativa if you:  Are a woman.  Are between ages 12 and 5.  Have a family history of hidradenitis suppurativa.  Have a personal history of acne.  Are overweight.  Smoke.  Take the drug lithium. SIGNS AND SYMPTOMS  The first signs of an outbreak are usually painful skin bumps that look like pimples. As the condition progresses:  Skin bumps may get bigger and grow deeper into the skin.  Bumps under the skin may rupture and drain smelly pus.  Skin may become itchy and infected.  Skin may thicken and scar.  Drainage may continue through tunnels under the skin (fistulas).  Walking and moving  your arms can become painful. DIAGNOSIS  Your health care provider may diagnose hidradenitis suppurativa based on your medical history and your signs and symptoms. A physical exam will also be done. You may need to see a health care provider who specializes in skin diseases (dermatologist). You may also have tests done to confirm the diagnosis. These can include:  Swabbing a sample of pus or drainage from your skin so it can be sent to the lab and tested for infection.  Blood tests to check for infection. TREATMENT  The same treatment will not work for everybody with hidradenitis suppurativa. Your treatment will depend on how severe your symptoms are. You may need to try several treatments to find what works best for you. Part of your treatment may include cleaning and bandaging (dressing) your wounds. You may also have to take medicines, such as the following:  Antibiotics.  Acne medicines.  Medicines to block or suppress the immune system.  A diabetes medicine (metformin) is sometimes used to treat this condition.  For women, birth control pills can sometimes help relieve symptoms. You may need surgery if you have a severe case of hidradenitis suppurativa that does not respond to medicine. Surgery may involve:   Using a laser to clear the skin and remove hair follicles.  Opening and draining deep sores.  Removing the areas of skin that are diseased and scarred. HOME CARE INSTRUCTIONS  Learn as much as you can about your disease, and work closely with your health care providers.  Take medicines only as directed by your health care provider.  If you were prescribed an antibiotic medicine, finish it all even if you start to feel better.  If you are overweight, losing weight may be very helpful. Try to reach and maintain a healthy weight.  Do not use any tobacco products, including cigarettes, chewing tobacco, or electronic cigarettes. If you need help quitting, ask your health care  provider.  Do not shave the areas where you get hidradenitis suppurativa.  Do not wear deodorant.  Wear loose-fitting clothes.  Try not to overheat and get sweaty.  Take a daily bleach bath as directed by your health care provider.  Fill your bathtub halfway with water.  Pour in  cup of unscented household bleach.  Soak for 5-10 minutes.  Cover sore areas with a warm, clean washcloth (compress) for 5-10 minutes. SEEK MEDICAL CARE IF:   You have a flare-up of hidradenitis suppurativa.  You have chills or a fever.  You are having trouble controlling your symptoms at home.   This information is not intended to replace advice given to you by your health care provider. Make sure you discuss any questions you have with your health care provider.   Document Released: 11/05/2003 Document Revised: 04/13/2014 Document Reviewed: 06/23/2013 Elsevier Interactive Patient Education [employer].

## 2015-05-21 ENCOUNTER — Other Ambulatory Visit: Payer: Self-pay | Admitting: Family Medicine

## 2015-05-21 DIAGNOSIS — D649 Anemia, unspecified: Secondary | ICD-10-CM | POA: Insufficient documentation

## 2015-05-21 LAB — TSH: TSH: 1.98 mIU/L

## 2015-05-21 MED ORDER — FERROUS SULFATE 325 (65 FE) MG PO TABS: 325.0000 mg | ORAL_TABLET | Freq: Two times a day (BID) | ORAL | Status: DC

## 2015-05-21 NOTE — Progress Notes (Signed)
Called, no answer, left message for patient to return call regarding labs. Thanks!

## 2015-05-21 NOTE — Progress Notes (Signed)
Patient returned call and I advised of labs and to take ferrous sulfate twice daily as prescribed. Also advised to increase sources of protein in diet (example: green leafy veggies, and organ meats) Patient verbalized understanding. Thanks!

## 2015-05-22 ENCOUNTER — Encounter: Payer: Self-pay | Admitting: Family Medicine

## 2015-05-23 LAB — WOUND CULTURE
Gram Stain: NONE SEEN
Gram Stain: NONE SEEN

## 2015-05-26 ENCOUNTER — Telehealth: Payer: Self-pay | Admitting: Family Medicine

## 2015-05-26 DIAGNOSIS — L732 Hidradenitis suppurativa: Secondary | ICD-10-CM

## 2015-05-26 MED ORDER — SULFAMETHOXAZOLE-TRIMETHOPRIM 800-160 MG PO TABS: 1.0000 | ORAL_TABLET | Freq: Two times a day (BID) | ORAL | Status: DC

## 2015-05-26 MED ORDER — CHLORHEXIDINE GLUCONATE 4 % EX LIQD: Freq: Every day | CUTANEOUS | Status: DC | PRN

## 2015-05-26 NOTE — Telephone Encounter (Signed)
Reviewed labs, gram stain showed positive cocci in pairs. Will start a course of antibiotics.   Meds ordered this encounter  Medications  . sulfamethoxazole-trimethoprim (BACTRIM DS) 800-160 MG tablet    Sig: Take 1 tablet by mouth 2 (two) times daily.    Dispense:  20 tablet    Refill:  0    Torrie Lafavor M, FNP

## 2015-05-27 NOTE — Telephone Encounter (Signed)
Called and left message for patient to return call. Thanks.

## 2015-05-28 NOTE — Telephone Encounter (Signed)
Spoke with patient and advised of bacteria and to start 10 day course of bactrium DS. Advised of [place]'s instructions to clean with Hibiclens, sleep with arms elevated, and avoid touching. Patient verbalized understanding and appointment was set for 06/07/2015 for follow up. Thanks!

## 2015-06-07 ENCOUNTER — Ambulatory Visit: Payer: Self-pay | Admitting: Family Medicine

## 2015-06-25 ENCOUNTER — Encounter: Payer: Self-pay | Admitting: Family Medicine

## 2015-06-25 NOTE — Progress Notes (Signed)
Patient is here for FU  Patient denies pain at this time.   

## 2015-06-25 NOTE — Progress Notes (Signed)
   Subjective:    Patient ID: Amanda Davenport, female    DOB: October 21, 1980, 35 y.o.   MRN: [id number]  HPI   Past Medical History  Diagnosis Date  . Allergy   . Anemia   . Recurrent boils   . Nearsightedness     wears glasses  . Chronic headache   . Arrhythmia   . WPW (Wolff-Parkinson-White syndrome)   . Obesity    Immunization History  Administered Date(s) Administered  . Tdap 05/20/2015   Social History   Social History Narrative   Past Surgical History  Procedure Laterality Date  . Cystectomy      tonsils   Review of Systems       Objective:   Physical Exam  Eyes: Conjunctivae are normal.     There were no vitals taken for this visit. Assessment & Plan:   This encounter was created in error - please disregard.

## 2015-07-30 ENCOUNTER — Ambulatory Visit: Payer: Self-pay | Admitting: Family Medicine

## 2015-09-19 ENCOUNTER — Encounter (HOSPITAL_COMMUNITY): Payer: Self-pay | Admitting: Emergency Medicine

## 2015-09-19 ENCOUNTER — Ambulatory Visit (HOSPITAL_COMMUNITY)
Admission: EM | Admit: 2015-09-19 | Discharge: 2015-09-19 | Disposition: A | Discharge status: Home / Self Care | Payer: Self-pay | Attending: Emergency Medicine | Admitting: Emergency Medicine

## 2015-09-19 DIAGNOSIS — N898 Other specified noninflammatory disorders of vagina: Secondary | ICD-10-CM

## 2015-09-19 DIAGNOSIS — Z7251 High risk heterosexual behavior: Secondary | ICD-10-CM

## 2015-09-19 LAB — POCT URINALYSIS DIP (DEVICE)
Bilirubin Urine: NEGATIVE
Glucose, UA: NEGATIVE mg/dL
KETONES UR: NEGATIVE mg/dL
Nitrite: NEGATIVE
PH: 6 (ref 5.0–8.0)
PROTEIN: 30 mg/dL — AB
SPECIFIC GRAVITY, URINE: 1.025 (ref 1.005–1.030)
Urobilinogen, UA: 0.2 mg/dL (ref 0.0–1.0)

## 2015-09-19 LAB — POCT PREGNANCY, URINE: Preg Test, Ur: NEGATIVE

## 2015-09-19 MED ORDER — METRONIDAZOLE 500 MG PO TABS: 500.0000 mg | ORAL_TABLET | Freq: Two times a day (BID) | ORAL | Status: DC

## 2015-09-19 NOTE — ED Notes (Signed)
Call back number updated in EPIC.

## 2015-09-19 NOTE — Discharge Instructions (Signed)
Safe Sex Safe sex is about reducing the risk of giving or getting a sexually transmitted disease (STD). STDs are spread through sexual contact involving the genitals, mouth, or rectum. Some STDs can be cured and others cannot. Safe sex can also prevent unintended pregnancies.  WHAT ARE SOME SAFE SEX PRACTICES?  Limit your sexual activity to only one partner who is having sex with only you.  Talk to your partner about his or her past partners, past STDs, and drug use.  Use a condom every time you have sexual intercourse. This includes vaginal, oral, and anal sexual activity. Both females and males should wear condoms during oral sex. Only use latex or polyurethane condoms and water-based lubricants. Using petroleum-based lubricants or oils to lubricate a condom will weaken the condom and increase the chance that it will break. The condom should be in place from the beginning to the end of sexual activity. Wearing a condom reduces, but does not completely eliminate, your risk of getting or giving an STD. STDs can be spread by contact with infected body fluids and skin.  Get vaccinated for hepatitis B and HPV.  Avoid alcohol and recreational drugs, which can affect your judgment. You may forget to use a condom or participate in high-risk sex.  For females, avoid douching after sexual intercourse. Douching can spread an infection farther into the reproductive tract.  Check your body for signs of sores, blisters, rashes, or unusual discharge. See your health care provider if you notice any of these signs.  Avoid sexual contact if you have symptoms of an infection or are being treated for an STD. If you or your partner has herpes, avoid sexual contact when blisters are present. Use condoms at all other times.  If you are at risk of being infected with HIV, it is recommended that you take a prescription medicine daily to prevent HIV infection. This is called pre-exposure prophylaxis (PrEP). You are  considered at risk if:  You are a man who has sex with other men (MSM).  You are a heterosexual man or woman who is sexually active with more than one partner.  You take drugs by injection.  You are sexually active with a partner who has HIV.  Talk with your health care provider about whether you are at high risk of being infected with HIV. If you choose to begin PrEP, you should first be tested for HIV. You should then be tested every 3 months for as long as you are taking PrEP.  See your health care provider for regular screenings, exams, and tests for other STDs. Before having sex with a new partner, each of you should be screened for STDs and should talk about the results with each other. WHAT ARE THE BENEFITS OF SAFE SEX?   There is less chance of getting or giving an STD.  You can prevent unwanted or unintended pregnancies.  By discussing safe sex concerns with your partner, you may increase feelings of intimacy, comfort, trust, and honesty between the two of you.   This information is not intended to replace advice given to you by your health care provider. Make sure you discuss any questions you have with your health care provider.   Document Released: 04/30/2004 Document Revised: 04/13/2014 Document Reviewed: 09/14/2011 Elsevier Interactive Patient Education [employer].

## 2015-09-19 NOTE — ED Provider Notes (Signed)
CSN: [id number]     Arrival date & time 09/19/15  1827 History   First MD Initiated Contact with Patient 09/19/15 1942     Chief Complaint  Patient presents with  . Vaginal Discharge  . Exposure to STD   (Consider location/radiation/quality/duration/timing/severity/associated sxs/prior Treatment) HPI Comments: 35 year old female is complaining of a scant vaginal discharge with mild itching for one week. She has multiple sexual partners and does not use condoms regularly. She is requesting STD screening.  Denies pelvic pain or abdominal pain.   Past Medical History  Diagnosis Date  . Allergy   . Anemia   . Recurrent boils   . Nearsightedness     wears glasses  . Chronic headache   . Arrhythmia   . WPW (Wolff-Parkinson-White syndrome)   . Obesity    Past Surgical History  Procedure Laterality Date  . Cystectomy      tonsils   Family History  Problem Relation Age of Onset  . Breast cancer Mother   . Pulmonary embolism Mother     died of PE  . Diabetes Paternal Grandmother   . Heart disease Neg Hx   . Stroke Neg Hx   . Colon cancer Mother   . Irritable bowel syndrome Mother    Social History  Substance Use Topics  . Smoking status: Former Smoker -- .5 years    Types: Cigarettes  . Smokeless tobacco: Never Used     Comment: smokes black and milds  . Alcohol Use: No   OB History    Gravida Para Term Preterm AB TAB SAB Ectopic Multiple Living   0              Review of Systems  Constitutional: Negative.   Respiratory: Negative.   Genitourinary: Positive for vaginal discharge. Negative for dysuria, urgency, frequency, flank pain, vaginal bleeding, menstrual problem and pelvic pain.  Musculoskeletal: Negative.   Neurological: Negative.   All other systems reviewed and are negative.   Allergies  Other; Penicillins; Shellfish-derived products; and Shrimp  Home Medications   Prior to Admission medications   Medication Sig Start Date End Date Taking?  Authorizing Provider  fluticasone (FLONASE) 50 MCG/ACT nasal spray Place 2 sprays into both nostrils daily as needed for allergies or rhinitis.    Historical Provider, MD  ibuprofen (ADVIL,MOTRIN) 200 MG tablet Take 200 mg by mouth every 6 (six) hours as needed.    Historical Provider, MD  metroNIDAZOLE (FLAGYL) 500 MG tablet Take 1 tablet (500 mg total) by mouth 2 (two) times daily. X 7 days 09/19/15   Janne Napoleon, NP  naproxen sodium (ANAPROX) 220 MG tablet Take 440 mg by mouth daily as needed (pain). Reported on 06/25/2015    Historical Provider, MD  triamcinolone cream (KENALOG) 0.1 % Apply 1 application topically 2 (two) times daily as needed (dry skin). Reported on 06/25/2015    Historical Provider, MD   Meds Ordered and Administered this Visit  Medications - No data to display  BP 134/76 mmHg  Pulse 95  Temp(Src) 98.6 F (37 C) (Oral)  Resp 18  SpO2 100%  LMP 09/01/2015 No data found.   Physical Exam  Constitutional: She is oriented to person, place, and time. She appears well-developed and well-nourished. No distress.  HENT:  Mouth/Throat: Oropharyngeal exudate present.  Eyes: EOM are normal.  Neck: Normal range of motion. Neck supple.  Cardiovascular: Normal rate.   Pulmonary/Chest: Effort normal. No respiratory distress.  Genitourinary:  Normal external female genitalia. There are  multiple scars surrounding the genitals, perineum, labia majora and medial thighs that has resulted from pustular lesions secondary to hidradenitis superlative. No active lesions at this time. There is a small amount of white thin discharge in the vaginal vault. The cervix is midline, pink and without lesions. No bleeding or drainage from the os. No CMT.  Musculoskeletal: Normal range of motion. She exhibits no edema.  Neurological: She is alert and oriented to person, place, and time. She exhibits normal muscle tone.  Skin: Skin is warm and dry.  Psychiatric: She has a normal mood and affect.   Nursing note and vitals reviewed.   ED Course  Procedures (including critical care time)  Labs Review Labs Reviewed  POCT URINALYSIS DIP (DEVICE) - Abnormal; Notable for the following:    Hgb urine dipstick SMALL (*)    Protein, ur 30 (*)    Leukocytes, UA TRACE (*)    All other components within normal limits  POCT PREGNANCY, URINE  CERVICOVAGINAL ANCILLARY ONLY   Results for orders placed or performed during the hospital encounter of 09/19/15  POCT urinalysis dip (device)  Result Value Ref Range   Glucose, UA NEGATIVE NEGATIVE mg/dL   Bilirubin Urine NEGATIVE NEGATIVE   Ketones, ur NEGATIVE NEGATIVE mg/dL   Specific Gravity, Urine 1.025 1.005 - 1.030   Hgb urine dipstick SMALL (A) NEGATIVE   pH 6.0 5.0 - 8.0   Protein, ur 30 (A) NEGATIVE mg/dL   Urobilinogen, UA 0.2 0.0 - 1.0 mg/dL   Nitrite NEGATIVE NEGATIVE   Leukocytes, UA TRACE (A) NEGATIVE  Pregnancy, urine POC  Result Value Ref Range   Preg Test, Ur NEGATIVE NEGATIVE     Imaging Review No results found.   Visual Acuity Review  Right Eye Distance:   Left Eye Distance:   Bilateral Distance:    Right Eye Near:   Left Eye Near:    Bilateral Near:         MDM   1. Vaginal discharge   2. High risk heterosexual behavior    Meds ordered this encounter  Medications  . metroNIDAZOLE (FLAGYL) 500 MG tablet    Sig: Take 1 tablet (500 mg total) by mouth 2 (two) times daily. X 7 days    Dispense:  14 tablet    Refill:  0    Order Specific Question:  Supervising Provider    Answer:  Melony Overly [id number]   Cervical cytology pending.    Janne Napoleon, NP 09/19/15 2033

## 2015-09-19 NOTE — ED Notes (Signed)
C/o vag d/c and itching onset x1 week... Wants to be screened for STD... Sexually active... Has mult partners w/occasional use of condoms... A&O x4... No acute distress.

## 2015-09-20 LAB — CERVICOVAGINAL ANCILLARY ONLY
CHLAMYDIA, DNA PROBE: NEGATIVE
NEISSERIA GONORRHEA: NEGATIVE
WET PREP (BD AFFIRM): POSITIVE — AB

## 2015-11-03 ENCOUNTER — Ambulatory Visit (HOSPITAL_COMMUNITY)
Admission: EM | Admit: 2015-11-03 | Discharge: 2015-11-03 | Disposition: A | Discharge status: Home / Self Care | Payer: Self-pay | Attending: Emergency Medicine | Admitting: Emergency Medicine

## 2015-11-03 ENCOUNTER — Encounter (HOSPITAL_COMMUNITY): Payer: Self-pay | Admitting: *Deleted

## 2015-11-03 DIAGNOSIS — M25571 Pain in right ankle and joints of right foot: Secondary | ICD-10-CM | POA: Insufficient documentation

## 2015-11-03 DIAGNOSIS — Z87891 Personal history of nicotine dependence: Secondary | ICD-10-CM | POA: Insufficient documentation

## 2015-11-03 DIAGNOSIS — Z91013 Allergy to seafood: Secondary | ICD-10-CM | POA: Insufficient documentation

## 2015-11-03 DIAGNOSIS — Z880 Allergy status to penicillin: Secondary | ICD-10-CM | POA: Insufficient documentation

## 2015-11-03 DIAGNOSIS — M25531 Pain in right wrist: Secondary | ICD-10-CM | POA: Insufficient documentation

## 2015-11-03 LAB — URIC ACID: Uric Acid, Serum: 6.5 mg/dL (ref 2.3–6.6)

## 2015-11-03 MED ORDER — HYDROCODONE-ACETAMINOPHEN 5-325 MG PO TABS: 1.0000 | ORAL_TABLET | Freq: Four times a day (QID) | ORAL | 0 refills | Status: DC | PRN

## 2015-11-03 MED ORDER — PREDNISONE 20 MG PO TABS: ORAL_TABLET | ORAL | 0 refills | Status: DC

## 2015-11-03 NOTE — Discharge Instructions (Signed)
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 We are going to check a uric acid level for gout. Take the prednisone taper as prescribed. Use the hydrocodone every 4-6 hours as needed for severe pain. Please follow-up with your primary care doctor. You may end up needing a referral to orthopedics or rheumatology.

## 2015-11-03 NOTE — ED Triage Notes (Signed)
Patient reports pain to right ankle and left elbow x 1.5, states swelling and constant discomfort. About 4-5 days ago started to having swelling and pain to right wrist, swelling noted on exam. Patient reports pain even at rest. Denies numbness or tingling.

## 2015-11-03 NOTE — ED Provider Notes (Signed)
[place]    CSN: [id number] Arrival date & time: 11/03/15  1421  First Provider Contact:  First MD Initiated Contact with Patient 11/03/15 1622        History   Chief Complaint Chief Complaint  Patient presents with  . Wrist Pain  . Ankle Pain    HPI Amanda Davenport is a 35 y.o. female.   She is a 35 year old woman here for evaluation of joint pain. She states for the last several years she has had migratory joint pains. She prematurely always has pain and some joint, but it moves around. It can involve her ankle, knees, hands, wrists, and elbows. It is not symmetric. She did have blood work done in 2016 that was negative for RA and lupus. Today, she reports some lingering swelling and pain in her right ankle and left elbow. Over the last 3-4 days she has developed pain and swelling in her right wrist. She states when the joints are swollen they are extremely tender to touch. No known injury or trauma. Her dad does have a history of gout.    Past Medical History:  Diagnosis Date  . Allergy   . Anemia   . Arrhythmia   . Chronic headache   . Nearsightedness    wears glasses  . Obesity   . Recurrent boils   . WPW (Wolff-Parkinson-White syndrome)     Patient Active Problem List   Diagnosis Date Noted  . Absolute anemia 05/21/2015  . Neck strain 12/11/2014  . Hydradenitis 12/11/2014  . Dental caries 12/11/2014  . Constipation 12/03/2014  . Neck pain 12/03/2014  . Myalgia and myositis 11/27/2014  . 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 Morbid obesity ([place]) 11/27/2014  . Hematochezia 12/16/2012  . Wolff-Parkinson-White (WPW) syndrome 03/01/2012  . Iron deficiency anemia 02/16/2012  . Tachycardia 01/28/2012  . Hidradenitis suppurativa 08/18/2011  . AXILLARY ABSCESS 12/30/2006  . ANKLE PAIN 10/11/2006    Past Surgical History:  Procedure Laterality Date  . CYSTECTOMY     tonsils    OB History    Gravida Para Term Preterm AB Living   0              SAB TAB Ectopic Multiple Live Births                   Home Medications    Prior to Admission medications   Medication Sig Start Date End Date Taking? Authorizing Provider  fluticasone (FLONASE) 50 MCG/ACT nasal spray Place 2 sprays into both nostrils daily as needed for allergies or rhinitis.    Historical Provider, MD  HYDROcodone-acetaminophen (NORCO) 5-325 MG tablet Take 1 tablet by mouth every 6 (six) hours as needed for moderate pain. 11/03/15   Melony Overly, MD  ibuprofen (ADVIL,MOTRIN) 200 MG tablet Take 200 mg by mouth every 6 (six) hours as needed.    Historical Provider, MD  naproxen sodium (ANAPROX) 220 MG tablet Take 440 mg by mouth daily as needed (pain). Reported on 06/25/2015    Historical Provider, MD  predniSONE (DELTASONE) 20 MG tablet Take 3 tablets daily for 5 days, then 2 tablets for 3 days, then 1 tablet for 3 days. 11/03/15   Melony Overly, MD  triamcinolone cream (KENALOG) 0.1 % Apply 1 application topically 2 (two) times daily as needed (dry skin). Reported on 06/25/2015    Historical Provider, MD    Family History Family History  Problem Relation Age of Onset  . Breast cancer Mother   .  Pulmonary embolism Mother     died of PE  . Colon cancer Mother   . Irritable bowel syndrome Mother   . Diabetes Paternal Grandmother   . Heart disease Neg Hx   . Stroke Neg Hx     Social History Social History  Substance Use Topics  . Smoking status: Former Smoker    Years: 0.50    Types: Cigarettes  . Smokeless tobacco: Never Used     Comment: smokes black and milds  . Alcohol use No     Allergies   Other; Penicillins; Shellfish-derived products; and Shrimp [shellfish allergy]   Review of Systems Review of Systems  Constitutional: Negative for fever.  Musculoskeletal: Positive for arthralgias and joint swelling.     Physical Exam Triage Vital Signs ED Triage Vitals  Enc Vitals Group     BP 11/03/15 1628 120/78     Pulse Rate 11/03/15 1628 79      Resp 11/03/15 1628 17     Temp 11/03/15 1628 98.9 F (37.2 C)     Temp Source 11/03/15 1628 Oral     SpO2 11/03/15 1628 100 %     Weight --      Height --      Head Circumference --      Peak Flow --      Pain Score 11/03/15 1629 7     Pain Loc --      Pain Edu? --      Excl. in [place]? --    No data found.   Updated Vital Signs BP 120/78 (BP Location: Left Arm)   Pulse 79   Temp 98.9 F (37.2 C) (Oral)   Resp 17   LMP 10/24/2015   SpO2 100%   Visual Acuity Right Eye Distance:   Left Eye Distance:   Bilateral Distance:    Right Eye Near:   Left Eye Near:    Bilateral Near:     Physical Exam  Constitutional: She is oriented to person, place, and time. She appears well-developed and well-nourished. No distress.  Cardiovascular: Normal rate.   Pulmonary/Chest: Effort normal.  Musculoskeletal:  Right wrist: Minimal swelling at the ulnar aspect. She has quite tender over volar aspect of the wrist to light touch and there is mild warmth. No erythema. Range of motion limited due to pain. Right ankle: Swelling around the medial malleolus. Minimal tenderness. Left elbow: No swelling or erythema noted. No tenderness.  Neurological: She is alert and oriented to person, place, and time.     UC Treatments / Results  Labs (all labs ordered are listed, but only abnormal results are displayed) Labs Reviewed  URIC ACID    EKG  EKG Interpretation None       Radiology No results found.  Procedures Procedures (including critical care time)  Medications Ordered in UC Medications - No data to display   Initial Impression / Assessment and Plan / UC Course  I have reviewed the triage vital signs and the nursing notes.  Pertinent labs & imaging results that were available during my care of the patient were reviewed by me and considered in my medical decision making (see chart for details).  Clinical Course    I reviewed the blood work done previously. She did have an  elevated ESR, but other rheumatologic workup was negative. Given the swelling and mild warmth with tenderness to light touch, gout is a possibility although this would be an unusual presentation. We will check a uric  acid level. Prednisone taper and hydrocodone to treat acutely. Follow-up with PCP.  Final Clinical Impressions(s) / UC Diagnoses   Final diagnoses:  Right wrist pain    New Prescriptions New Prescriptions   HYDROCODONE-ACETAMINOPHEN (NORCO) 5-325 MG TABLET    Take 1 tablet by mouth every 6 (six) hours as needed for moderate pain.   PREDNISONE (DELTASONE) 20 MG TABLET    Take 3 tablets daily for 5 days, then 2 tablets for 3 days, then 1 tablet for 3 days.     Melony Overly, MD 11/03/15 [phone]

## 2015-11-09 ENCOUNTER — Telehealth (HOSPITAL_COMMUNITY): Payer: Self-pay | Admitting: Emergency Medicine

## 2015-11-09 NOTE — Telephone Encounter (Signed)
Verified patient identity.  Spoke directly to patient in regards to lab work. Patient had no questions at this time.  Instructed to share results with pcp and results are in my chart.

## 2015-11-09 NOTE — Telephone Encounter (Signed)
-----   Message from Melony Overly, MD sent at 11/07/2015  3:11 PM EDT ----- The uric acid level is on the high end of normal; this could be gout.  Please let you primary doctor know about the uric acid level.  Result note posted to patient's mychart.

## 2015-11-13 NOTE — Progress Notes (Signed)
11/09/2015 10:13.  Spoke to patient in regards to lab results and follow up as outlined in dr honig's .

## 2015-12-17 ENCOUNTER — Ambulatory Visit (HOSPITAL_COMMUNITY)
Admission: EM | Admit: 2015-12-17 | Discharge: 2015-12-17 | Disposition: A | Discharge status: Home / Self Care | Payer: Self-pay | Attending: Family Medicine | Admitting: Family Medicine

## 2015-12-17 ENCOUNTER — Encounter (HOSPITAL_COMMUNITY): Payer: Self-pay | Admitting: Emergency Medicine

## 2015-12-17 DIAGNOSIS — M109 Gout, unspecified: Secondary | ICD-10-CM

## 2015-12-17 DIAGNOSIS — M10031 Idiopathic gout, right wrist: Secondary | ICD-10-CM

## 2015-12-17 MED ORDER — PREDNISONE 10 MG PO TABS: ORAL_TABLET | ORAL | 0 refills | Status: DC

## 2015-12-17 NOTE — ED Triage Notes (Signed)
The patient presented to the [facility] with a complaint of right wrist pain. The patient stated that she was evaluated at the [facility] on 11/03/2015 and diagnosed with Gout. The patient was prescribed oral steroids and they did help. The patient stated that she was advised to follow up with her PCP but she is out on maternity leave until January 2018.

## 2015-12-17 NOTE — ED Provider Notes (Signed)
CSN: [id number]     Arrival date & time 12/17/15  1828 History   None    Chief Complaint  Patient presents with  . Wrist Pain   (Consider location/radiation/quality/duration/timing/severity/associated sxs/prior Treatment) HPI History obtained from patient:  Pt presents with the cc of:  Right wrist pain Duration of symptoms: 2 days Treatment prior to arrival: Ibuprofen without relief of symptoms Context: History of gout in the right wrist in the past seems similar symptoms. Sudden onset. Other symptoms include:  Pain with movement Pain score:  4 FAMILY HISTORY:  Gout    Past Medical History:  Diagnosis Date  . Allergy   . Anemia   . Arrhythmia   . Chronic headache   . Nearsightedness    wears glasses  . Obesity   . Recurrent boils   . WPW (Wolff-Parkinson-White syndrome)    Past Surgical History:  Procedure Laterality Date  . CYSTECTOMY     tonsils   Family History  Problem Relation Age of Onset  . Breast cancer Mother   . Pulmonary embolism Mother     died of PE  . Colon cancer Mother   . Irritable bowel syndrome Mother   . Diabetes Paternal Grandmother   . Heart disease Neg Hx   . Stroke Neg Hx    Social History  Substance Use Topics  . Smoking status: Former Smoker    Years: 0.50    Types: Cigarettes  . Smokeless tobacco: Never Used     Comment: smokes black and milds  . Alcohol use No   OB History    Gravida Para Term Preterm AB Living   0             SAB TAB Ectopic Multiple Live Births                 Review of Systems  Denies: HEADACHE, NAUSEA, ABDOMINAL PAIN, CHEST PAIN, CONGESTION, DYSURIA, SHORTNESS OF BREATH  Allergies  Other; Penicillins; Shellfish-derived products; and Shrimp [shellfish allergy]  Home Medications   Prior to Admission medications   Medication Sig Start Date End Date Taking? Authorizing Provider  fluticasone (FLONASE) 50 MCG/ACT nasal spray Place 2 sprays into both nostrils daily as needed for allergies or rhinitis.     Historical Provider, MD  HYDROcodone-acetaminophen (NORCO) 5-325 MG tablet Take 1 tablet by mouth every 6 (six) hours as needed for moderate pain. 11/03/15   Melony Overly, MD  ibuprofen (ADVIL,MOTRIN) 200 MG tablet Take 200 mg by mouth every 6 (six) hours as needed.    Historical Provider, MD  naproxen sodium (ANAPROX) 220 MG tablet Take 440 mg by mouth daily as needed (pain). Reported on 06/25/2015    Historical Provider, MD  predniSONE (DELTASONE) 10 MG tablet Sig: 4 tables once a day for 3 days, 3 tablets once a day X3 days, 2 tablets a day for 3 days, 1 tablet a day for 3 days. Disp 30/10 mg 12/17/15   Konrad Felix, PA  predniSONE (DELTASONE) 20 MG tablet Take 3 tablets daily for 5 days, then 2 tablets for 3 days, then 1 tablet for 3 days. 11/03/15   Melony Overly, MD  triamcinolone cream (KENALOG) 0.1 % Apply 1 application topically 2 (two) times daily as needed (dry skin). Reported on 06/25/2015    Historical Provider, MD   Meds Ordered and Administered this Visit  Medications - No data to display  BP 106/72 (BP Location: Left Arm)   Pulse 97   Temp  98.4 F (36.9 C) (Oral)   Resp 18   LMP 12/17/2015 (Exact Date)   SpO2 100%  No data found.   Physical Exam NURSES NOTES AND VITAL SIGNS REVIEWED. CONSTITUTIONAL: Well developed, well nourished, no acute distress HEENT: normocephalic, atraumatic EYES: Conjunctiva normal NECK:normal ROM, supple, no adenopathy PULMONARY:No respiratory distress, normal effort ABDOMINAL: Soft, ND, NT BS+, No CVAT MUSCULOSKELETAL: Normal ROM of all extremities, Right distal ulnar, red, hot, tender to palpation. No cellulitis.  SKIN: warm and dry without rash PSYCHIATRIC: Mood and affect, behavior are normal  Urgent Care Course   Clinical Course    Procedures (including critical care time)  Labs Review Labs Reviewed - No data to display  Imaging Review No results found.   Visual Acuity Review  Right Eye Distance:   Left Eye Distance:    Bilateral Distance:    Right Eye Near:   Left Eye Near:    Bilateral Near:         MDM   1. Acute gout of right wrist, unspecified cause     Patient is reassured that there are no issues that require transfer to higher level of care at this time or additional tests. Patient is advised to continue home symptomatic treatment. Patient is advised that if there are new or worsening symptoms to attend the emergency department, contact primary care provider, or return to UC. Instructions of care provided discharged home in stable condition.    THIS NOTE WAS GENERATED USING A VOICE RECOGNITION SOFTWARE PROGRAM. ALL REASONABLE EFFORTS  WERE MADE TO PROOFREAD THIS DOCUMENT FOR ACCURACY.  I have verbally reviewed the discharge instructions with the patient. A printed AVS was given to the patient.  All questions were answered prior to discharge.      Konrad Felix, PA 12/17/15 1907

## 2016-01-14 ENCOUNTER — Ambulatory Visit (HOSPITAL_COMMUNITY)
Admission: EM | Admit: 2016-01-14 | Discharge: 2016-01-14 | Disposition: A | Discharge status: Home / Self Care | Payer: Self-pay | Attending: Emergency Medicine | Admitting: Emergency Medicine

## 2016-01-14 ENCOUNTER — Encounter (HOSPITAL_COMMUNITY): Payer: Self-pay | Admitting: Emergency Medicine

## 2016-01-14 DIAGNOSIS — M109 Gout, unspecified: Secondary | ICD-10-CM

## 2016-01-14 MED ORDER — INDOMETHACIN 25 MG PO CAPS: 25.0000 mg | ORAL_CAPSULE | Freq: Three times a day (TID) | ORAL | 0 refills | Status: DC | PRN

## 2016-01-14 MED ORDER — PREDNISONE 20 MG PO TABS: ORAL_TABLET | ORAL | 0 refills | Status: DC

## 2016-01-14 NOTE — ED Provider Notes (Signed)
CSN: [id number]     Arrival date & time 01/14/16  1848 History   First MD Initiated Contact with Patient 01/14/16 2018     Chief Complaint  Patient presents with  . Gout   (Consider location/radiation/quality/duration/timing/severity/associated sxs/prior Treatment) HPI Amanda Davenport is a 35 y.o. female presenting to UC with c/o Right wrist and Left elbow pain and soreness that started about 3-4 days ago.  Pain is 8/10 at this time. Pt reports hx of gout, pain feels similar to prior episodes of gout.  She notes she is typically started on prednisone and does well. She has not f/u with PCP yet as PCP is on maternity leave.  She denies injury to wrist or elbow. Denies fever, chills, n/v/d.    Past Medical History:  Diagnosis Date  . Allergy   . Anemia   . Arrhythmia   . Chronic headache   . Nearsightedness    wears glasses  . Obesity   . Recurrent boils   . WPW (Wolff-Parkinson-White syndrome)    Past Surgical History:  Procedure Laterality Date  . CYSTECTOMY     tonsils   Family History  Problem Relation Age of Onset  . Breast cancer Mother   . Pulmonary embolism Mother     died of PE  . Colon cancer Mother   . Irritable bowel syndrome Mother   . Diabetes Paternal Grandmother   . Heart disease Neg Hx   . Stroke Neg Hx    Social History  Substance Use Topics  . Smoking status: Former Smoker    Years: 0.50    Types: Cigarettes  . Smokeless tobacco: Never Used     Comment: smokes black and milds  . Alcohol use No   OB History    Gravida Para Term Preterm AB Living   0             SAB TAB Ectopic Multiple Live Births                 Review of Systems  Constitutional: Negative for chills and fever.  Musculoskeletal: Positive for arthralgias, joint swelling and myalgias.  Skin: Positive for color change. Negative for wound.  Neurological: Negative for weakness and numbness.    Allergies  Other; Penicillins; Shellfish-derived products; and Shrimp [shellfish  allergy]  Home Medications   Prior to Admission medications   Medication Sig Start Date End Date Taking? Authorizing Provider  fluticasone (FLONASE) 50 MCG/ACT nasal spray Place 2 sprays into both nostrils daily as needed for allergies or rhinitis.    Historical Provider, MD  HYDROcodone-acetaminophen (NORCO) 5-325 MG tablet Take 1 tablet by mouth every 6 (six) hours as needed for moderate pain. 11/03/15   Melony Overly, MD  ibuprofen (ADVIL,MOTRIN) 200 MG tablet Take 200 mg by mouth every 6 (six) hours as needed.    Historical Provider, MD  indomethacin (INDOCIN) 25 MG capsule Take 1 capsule (25 mg total) by mouth 3 (three) times daily as needed. Do not take more than needed to help with pain 01/14/16   Noland Fordyce, PA-C  naproxen sodium (ANAPROX) 220 MG tablet Take 440 mg by mouth daily as needed (pain). Reported on 06/25/2015    Historical Provider, MD  predniSONE (DELTASONE) 10 MG tablet Sig: 4 tables once a day for 3 days, 3 tablets once a day X3 days, 2 tablets a day for 3 days, 1 tablet a day for 3 days. Disp 30/10 mg 12/17/15   Konrad Felix, PA  predniSONE (DELTASONE) 20 MG tablet Take 3 tablets daily for 5 days, then 2 tablets for 3 days, then 1 tablet for 3 days. 11/03/15   Melony Overly, MD  predniSONE (DELTASONE) 20 MG tablet 3 tabs po day one, then 2 po daily x 4 days 01/14/16   Noland Fordyce, PA-C  triamcinolone cream (KENALOG) 0.1 % Apply 1 application topically 2 (two) times daily as needed (dry skin). Reported on 06/25/2015    Historical Provider, MD   Meds Ordered and Administered this Visit  Medications - No data to display  BP 136/79 (BP Location: Left Arm)   Pulse 97   Temp 98.6 F (37 C) (Oral)   Resp 18   LMP 01/13/2016 (Exact Date)   SpO2 100%  No data found.   Physical Exam  Constitutional: She is oriented to person, place, and time. She appears well-developed and well-nourished.  HENT:  Head: Normocephalic and atraumatic.  Eyes: EOM are normal.  Neck: Normal  range of motion.  Cardiovascular: Normal rate.   Pulmonary/Chest: Effort normal.  Musculoskeletal: Normal range of motion. She exhibits edema and tenderness. She exhibits no deformity.  Right wrist: mild edema to ulnar aspect with tenderness. Limited ROM due to pain.  Left elbow: no edema or deformity. Tenderness over olecranon. Full ROM.   Neurological: She is alert and oriented to person, place, and time.  Skin: Skin is warm and dry. There is erythema.  Right wrist: skin in tact. Mild erythema along ulnar aspect. Tender. No induration or fluctuance.  Left elbow: skin in tact. No ecchymosis or erythema.   Psychiatric: She has a normal mood and affect. Her behavior is normal.  Nursing note and vitals reviewed.   Urgent Care Course   Clinical Course    Procedures (including critical care time)  Labs Review Labs Reviewed - No data to display  Imaging Review No results found.   MDM   1. Acute gout of left elbow, unspecified cause   2. Acute gout of right wrist, unspecified cause    Pt c/o gout flare in her Right wrist and Left elbow, c/w prior episodes of gout. Mild erythema and edema to Right wrist, ulnar aspect.   This will be pt's 3rd flare since July of this year.  She has not f/u with PCP for gout as she is out on maternity leave until January.  She notes prednisone works. She has not been on indomethacin or colchicine in the past.  Rx: prednisone and indomethacin (advised not to take indomethacin long term, only use for flares)  Resource guide provided to help pt find another PCP in meantime as well as possible orthopedist/sports medicine provider to help with her frequent recurrent gout flares she may benefit from Allopurinol.   Patient verbalized understanding and agreement with treatment plan.     Noland Fordyce, PA-C 01/15/16 [phone]

## 2016-01-14 NOTE — Discharge Instructions (Signed)
Indomethacin is an antiinflammatory medication, most specifically used for gout flares.  In order to help prevent stomach upset, take until pain is tolerable then rapidly cut back amount you are taking to twice, then once per day until pain has resolved. Do not take other antiinflammatory medications (NSAIDs) such as ibuprofen (Motrin or Advil) or naproxen (Aleve) as this can cause you to take too much of the same medicine and cause stomach upset or ulcers.  Due to the frequency of gout flares, you may benefit from a medication called Allopurinol, however, it is important to have that medication monitored.  Please ask your primary care provider or sports medicine provided if you would be a good candidate.     Emergency Department Resource Guide 1) Find a Doctor and Pay Out of Pocket Although you won't have to find out who is covered by your insurance plan, it is a good idea to ask around and get recommendations. You will then need to call the office and see if the doctor you have chosen will accept you as a new patient and what types of options they offer for patients who are self-pay. Some doctors offer discounts or will set up payment plans for their patients who do not have insurance, but you will need to ask so you aren't surprised when you get to your appointment.  2) Contact Your Local Health Department Not all health departments have doctors that can see patients for sick visits, but many do, so it is worth a call to see if yours does. If you don't know where your local health department is, you can check in your phone book. The CDC also has a tool to help you locate your state's health department, and many state websites also have listings of all of their local health departments.  3) Find a [facility] If your illness is not likely to be very severe or complicated, you may want to try a walk in clinic. These are popping up all over the country in pharmacies, drugstores, and shopping  centers. They're usually staffed by nurse practitioners or physician assistants that have been trained to treat common illnesses and complaints. They're usually fairly quick and inexpensive. However, if you have serious medical issues or chronic medical problems, these are probably not your best option.  No Primary Care Doctor: - Call Health Connect at  [phone] - they can help you locate a primary care doctor that  accepts your insurance, provides certain services, etc. - Physician Referral Service- [phone]  Chronic Pain Problems: Organization         Address  Phone   Notes  [facility]  [phone] Patients need to be referred by their primary care doctor.   Medication Assistance: Organization         Address  Phone   Notes  [facility] Medication [facility] [address] [place], [place] [address] [phone] --Must be a resident of [facility] -- Must have NO insurance coverage whatsoever (no Medicaid/ Medicare, etc.) -- The pt. MUST have a primary care doctor that directs their care regularly and follows them in the community   MedAssist  [phone]   [employer]  [phone]    Agencies that provide inexpensive medical care: Organization         Address  Phone                                                                            Notes  [facility]  [phone]   [facility]    [phone]   [facility] [place], [place] [address] [phone]   [place]. [address] [place] [phone]   Planned Parenthood    [phone]   [facility]    [phone]   [place] and [address], [place] Phone:  [phone], Fax:  [phone] Hours of Operation:  9 am - 6 pm, M-F.  Also accepts Medicaid/Medicare  and self-pay.  [facility] for [place], [address], [place] Phone: [phone], Fax: [phone]. Hours of Operation:  8:30 am - 5:30 pm, M-F.  Also accepts Medicaid and self-pay.  [facility] [place] [address], [place] Phone: [phone]   [place], [place], [place] [phone], Ext. [phone] Mondays & Thursdays: 7-9 AM.  First 15 patients are seen on a first come, first serve basis.    [place] Providers:  Organization         Address                                                                       Phone                               Notes  [facility] [address],  [phone] Also accepts self-pay patients.  [facility] [id number] [place], [place]  [phone]   [place], [address], [place] [phone]   [facility] [address] [place] [phone]   Lucianne Lei [address], [place]   [phone] Only accepts [place] Access [place] patients after they have their name applied to their card.   Self-Pay (no insurance) in [facility]:   Organization         Address                                                     Phone               Notes  Sickle Cell Patients, [facility] [place] [phone]   [facility] [place] [phone]   [facility] [place]  [place]  [place], [address], [place] [phone]   [facility]/Dr. Osei-Bonsu  [address], [place] or [address], [place] [phone] Phone number for both [place] and [place] locations is the same.  Urgent Medical and [facility] [address], [place] [phone]   [facility] [address], [place] or [address] [phone]   [facility] [address], [place] [phone], phone; [phone], fax Sees patients 1st and 3rd Saturday of every month.  Must not qualify for public or private insurance (i.e. Medicaid, Medicare, [place] Health Choice, Veterans' Benefits)  Household income should be no more than 200% of the poverty level The clinic cannot treat you if you are pregnant or think you are pregnant  Sexually transmitted diseases are not treated at the clinic.    Dental Care: Organization         Address                                  Phone                       Notes  [facility] Department of [facility] Tat [place]oli [phone] Accepts children up to age 56 who are enrolled in [place] or [place]; pregnant women with a Medicaid card; and children who have applied for Medicaid or [place] Health Choice, but were declined, whose parents can pay a reduced fee at time of service.  [facility] [address] Department of [facility]  [address], [place] [phone] Accepts children up to age 33 who are enrolled in [place] or [place]; pregnant women with a Medicaid card; and children who have applied for Medicaid or [place] Health Choice, but were declined, whose parents can pay a reduced fee at time of service.  [place] Adult Dental Access PROGRAM  [place] [phone] Patients are seen by appointment only. Walk-ins are not accepted. [place] will see patients 38 years of age and older. Monday - Tuesday (8am-5pm) Most Wednesdays (8:30-5pm) $30 per visit, cash only  [facility] Adult Dental Access PROGRAM  [address], [facility] [phone] Patients are seen by appointment only. Walk-ins are not accepted. [place] will see patients 27 years of age and older. One Wednesday Evening (Monthly:  Volunteer Based).  $30 per visit, cash only  [place]  [phone] for adults; Children under age 27, call Graduate Pediatric Dentistry at [phone]. Children aged 65-14, please call [phone] to request a pediatric application.  Dental services are provided in all areas of dental care including fillings, crowns and bridges, complete and partial dentures, implants, gum treatment, root canals, and extractions. Preventive care is also provided. Treatment is provided to both adults and children. Patients are selected via a lottery and there is often a waiting list.   [facility] [address], [place]  [phone] [email or web address]   [facility] [address] [place], [place] [phone], Ext. [phone] Second and Fourth Thursday of each month, opens at 6:30 AM; Clinic ends at 9 AM.  Patients are seen on a first-come first-served basis, and a limited number are seen during each clinic.   Community  [place]  [address] Hillard Danker [place], [place] [phone]   Eligibility Requirements You must have lived in [place], [place], or [place] counties for at least the last three months.   You cannot be eligible for state or federal sponsored [employer], including [employer], [place], or [employer].   You generally cannot be eligible for healthcare insurance through your employer.    How to apply: Eligibility screenings are held every Tuesday and Wednesday afternoon from 1:00 pm until 4:00 pm. You do not need an appointment for the interview!  [facility] [address], [place], [place]   [place]  [place] Department  [place]  [phone]    Behavioral Health Resources in the Community: Intensive Outpatient Programs Organization         Address                                               Phone              Notes  [place]. [address], [place], [place] [phone]   [facility] [address] [place], [place]   ADS: Alcohol & Drug Svcs [address] [place], [place]   [place] [address]  [place], [place] or [phone]   Substance Abuse Resources Organization         Address                                Phone  Notes  Alcohol and Drug Services  [phone]   [place]  [phone]   The [place]   Chinita Pester  [phone]   Residential & Outpatient Substance Abuse Program  [phone]   Psychological Services Organization         Address                                  Phone                Notes  [facility] [place]  [place]  [phone]   [place] [address], [place] or [phone]    Mobile Crisis Teams Organization         Address  Phone  Notes  [facility], Mobile Crisis Care Unit  [phone]   Assertive Psychotherapeutic Services  [address] [place], [place]   [place] [address] [place] [phone]    Self-Help/Support Groups Organization         Address                         Phone             Notes  [place]. of [place] - variety of support groups  [place] Call for more information  Narcotics Anonymous (NA), [facility] [address], [employer]   2 meetings at this location   Special educational needs teacher  Address                                                    Phone              Notes  [facility] [address],    [place]  [phone]   [facility]  [address] [place] [id number], [place], [place]   [place], [place] [phone] Admissions: 8am-3pm M-F  Incentives Substance [place] [address],    [place], [place] [phone]   [facility] [address] [place], [place], [place]   [facility] [address]  [place], [place]   [facility]ight Programs - Intensive Outpatient [address] Kristeen [address], [place], [place]   [facility] [place].,  [place], [place] [phone] or [phone]   [facility] Services (RTS) [address] [place], [place] Accepts Medicaid  [facility] [place] [address]  [place] [phone] Substance Abuse/Addiction Treatment   [facility] Organization         Address                                                            Phone                    Notes  [facility]  [phone]   Domenic Schwab, PhD [address] Arlis Porta [place], [place]   [phone] or [phone]   [facility], [place] [phone]   [facility] [address], [place], [place] [phone] Insurance/Medicaid/sponsorship through [place] [facility] and Families [address] Ste [place]                                    [place], [place] [phone] [place] [address], [place] [phone]    Dr. Adele Schilder  [phone]   [facility] Dept. 1) [address] [place] 2) [place] 3)  [place] [address], [place] [phone]  [phone]   [place] [phone] or [phone] (After Hours)

## 2016-01-14 NOTE — ED Triage Notes (Signed)
The patient presented to the [facility] with a complaint of recurrent GOUT in her left elbow and right wrist. The patient stated that her PCP does not return until January for follow up.

## 2016-03-19 ENCOUNTER — Emergency Department (HOSPITAL_COMMUNITY)
Admission: EM | Admit: 2016-03-19 | Discharge: 2016-03-19 | Disposition: A | Discharge status: Home / Self Care | Payer: Self-pay | Attending: Emergency Medicine | Admitting: Emergency Medicine

## 2016-03-19 ENCOUNTER — Encounter (HOSPITAL_COMMUNITY): Payer: Self-pay | Admitting: Emergency Medicine

## 2016-03-19 DIAGNOSIS — Z87891 Personal history of nicotine dependence: Secondary | ICD-10-CM | POA: Insufficient documentation

## 2016-03-19 DIAGNOSIS — Z79899 Other long term (current) drug therapy: Secondary | ICD-10-CM | POA: Insufficient documentation

## 2016-03-19 DIAGNOSIS — L03012 Cellulitis of left finger: Secondary | ICD-10-CM

## 2016-03-19 MED ORDER — HYDROCODONE-ACETAMINOPHEN 5-325 MG PO TABS: 1.0000 | ORAL_TABLET | Freq: Four times a day (QID) | ORAL | 0 refills | Status: DC | PRN

## 2016-03-19 MED ORDER — FLUCONAZOLE 200 MG PO TABS: 200.0000 mg | ORAL_TABLET | Freq: Every day | ORAL | 0 refills | Status: AC

## 2016-03-19 MED ORDER — LIDOCAINE HCL 2 % IJ SOLN
10.0000 mL | Freq: Once | INTRAMUSCULAR | Status: AC
  Administered 2016-03-19: 200 mg
  Filled 2016-03-19: qty 20

## 2016-03-19 MED ORDER — NAPROXEN 500 MG PO TABS: 500.0000 mg | ORAL_TABLET | Freq: Two times a day (BID) | ORAL | 0 refills | Status: DC

## 2016-03-19 MED ORDER — SULFAMETHOXAZOLE-TRIMETHOPRIM 800-160 MG PO TABS: 1.0000 | ORAL_TABLET | Freq: Two times a day (BID) | ORAL | 0 refills | Status: AC

## 2016-03-19 MED FILL — SULFAMETHOXAZOLE/TMP DS TAB: 800-160 | 7 days supply | Qty: 14 | Fill #0

## 2016-03-19 MED FILL — FLUCONAZOLE 100 MG TABLET: 100 | 2 days supply | Qty: 4 | Fill #0

## 2016-03-19 MED FILL — HYDROCODON-APAP 5-325: 5-325 | 1 days supply | Qty: 6 | Fill #0

## 2016-03-19 NOTE — ED Triage Notes (Signed)
Pt had manicure 2 weeks ago, began having nail swelling and pain on Tuesday. Self-treating with apple cider vinegar and cold compresses, but nail has worsened. Left thumb nail bed is edematous.

## 2016-03-19 NOTE — ED Provider Notes (Signed)
[facility] Provider Note   CSN: [id number] Arrival date & time: 03/19/16  [id number]     History   Chief Complaint Chief Complaint  Patient presents with  . Nail Problem    HPI Amanda Davenport is a 35 y.o. female.  HPI   Amanda Davenport is a 35 y.o. female, with a history of WPW, presenting to the ED with left thumb pain and swelling beginning 4 days ago. Pt had a manicure about two weeks ago. Has tried using apple cider vinegar soaks and cold compresses with no improvement. Pain is severe, throbbing, nonradiating. Tetanus up to date. Patient had artificial nails placed at the time of manicure but has since removed the one on the thumb in question. Denies neuro deficits, injuries, or any other complaints.    Past Medical History:  Diagnosis Date  . Allergy   . Anemia   . Arrhythmia   . Chronic headache   . Nearsightedness    wears glasses  . Obesity   . Recurrent boils   . WPW (Wolff-Parkinson-White syndrome)     Patient Active Problem List   Diagnosis Date Noted  . Absolute anemia 05/21/2015  . Neck strain 12/11/2014  . Hydradenitis 12/11/2014  . Dental caries 12/11/2014  . Constipation 12/03/2014  . Neck pain 12/03/2014  . Myalgia and myositis 11/27/2014  . 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 Morbid obesity ([place]) 11/27/2014  . Hematochezia 12/16/2012  . Wolff-Parkinson-White (WPW) syndrome 03/01/2012  . Iron deficiency anemia 02/16/2012  . Tachycardia 01/28/2012  . Hidradenitis suppurativa 08/18/2011  . AXILLARY ABSCESS 12/30/2006  . ANKLE PAIN 10/11/2006    Past Surgical History:  Procedure Laterality Date  . CYSTECTOMY     tonsils    OB History    Gravida Para Term Preterm AB Living   0             SAB TAB Ectopic Multiple Live Births                   Home Medications    Prior to Admission medications   Medication Sig Start Date End Date Taking? Authorizing Provider  fluconazole (DIFLUCAN) 200 MG tablet Take 1 tablet (200 mg  total) by mouth daily. 03/19/16 03/21/16  Maliya Marich C Anniece Bleiler, PA-C  fluticasone (FLONASE) 50 MCG/ACT nasal spray Place 2 sprays into both nostrils daily as needed for allergies or rhinitis.    Historical Provider, MD  HYDROcodone-acetaminophen (NORCO) 5-325 MG tablet Take 1 tablet by mouth every 6 (six) hours as needed for moderate pain. 11/03/15   Melony Overly, MD  HYDROcodone-acetaminophen (NORCO/VICODIN) 5-325 MG tablet Take 1 tablet by mouth every 6 (six) hours as needed. 03/19/16   Zhara Gieske C Satonya Lux, PA-C  ibuprofen (ADVIL,MOTRIN) 200 MG tablet Take 200 mg by mouth every 6 (six) hours as needed.    Historical Provider, MD  indomethacin (INDOCIN) 25 MG capsule Take 1 capsule (25 mg total) by mouth 3 (three) times daily as needed. Do not take more than needed to help with pain 01/14/16   Noland Fordyce, PA-C  naproxen (NAPROSYN) 500 MG tablet Take 1 tablet (500 mg total) by mouth 2 (two) times daily. 03/19/16   Julie-Ann Vanmaanen C Verlie Liotta, PA-C  naproxen sodium (ANAPROX) 220 MG tablet Take 440 mg by mouth daily as needed (pain). Reported on 06/25/2015    Historical Provider, MD  predniSONE (DELTASONE) 10 MG tablet Sig: 4 tables once a day for 3 days, 3 tablets once a day X3 days, 2  tablets a day for 3 days, 1 tablet a day for 3 days. Disp 30/10 mg 12/17/15   Konrad Felix, PA  predniSONE (DELTASONE) 20 MG tablet Take 3 tablets daily for 5 days, then 2 tablets for 3 days, then 1 tablet for 3 days. 11/03/15   Melony Overly, MD  predniSONE (DELTASONE) 20 MG tablet 3 tabs po day one, then 2 po daily x 4 days 01/14/16   Noland Fordyce, PA-C  sulfamethoxazole-trimethoprim (BACTRIM DS,SEPTRA DS) 800-160 MG tablet Take 1 tablet by mouth 2 (two) times daily. 03/19/16 03/26/16  Adelene Polivka C Miley Blanchett, PA-C  triamcinolone cream (KENALOG) 0.1 % Apply 1 application topically 2 (two) times daily as needed (dry skin). Reported on 06/25/2015    Historical Provider, MD    Family History Family History  Problem Relation Age of Onset  . Breast cancer Mother     . Pulmonary embolism Mother     died of PE  . Colon cancer Mother   . Irritable bowel syndrome Mother   . Diabetes Paternal Grandmother   . Heart disease Neg Hx   . Stroke Neg Hx     Social History Social History  Substance Use Topics  . Smoking status: Former Smoker    Years: 0.50    Types: Cigarettes  . Smokeless tobacco: Never Used     Comment: smokes black and milds  . Alcohol use No     Allergies   Other; Penicillins; Shellfish-derived products; and Shrimp [shellfish allergy]   Review of Systems Review of Systems  Constitutional: Negative for fever.  Musculoskeletal: Positive for arthralgias and joint swelling.  Neurological: Negative for weakness and numbness.     Physical Exam Updated Vital Signs BP 117/72 (BP Location: Right Arm)   Pulse 101   Temp 98.9 F (37.2 C) (Oral)   Resp 18   Ht 5\' 9"  (1.753 m)   Wt 112.5 kg   SpO2 97%   BMI 36.62 kg/m   Physical Exam  Constitutional: She appears well-developed and well-nourished. No distress.  HENT:  Head: Normocephalic and atraumatic.  Eyes: Conjunctivae are normal.  Neck: Neck supple.  Cardiovascular: Normal rate and regular rhythm.   Pulmonary/Chest: Effort normal.  Musculoskeletal: She exhibits edema and tenderness.  Tenderness and swelling to left distal thumb. Full ROM in left thumb at both MCP and PIP joints. Distal circulation intact.   Neurological: She is alert.  No sensory deficits noted. Strength at each thumb joint and hand 5/5 bilaterally.  Skin: Skin is warm and dry. She is not diaphoretic.  Psychiatric: She has a normal mood and affect. Her behavior is normal.  Nursing note and vitals reviewed.    ED Treatments / Results  Labs (all labs ordered are listed, but only abnormal results are displayed) Labs Reviewed - No data to display  EKG  EKG Interpretation None       Radiology No results found.  Procedures .Nerve Block Date/Time: 03/19/2016 10:39 AM Performed by: Lorayne Bender Authorized by: Lorayne Bender   Consent:    Consent obtained:  Verbal   Consent given by:  Patient   Risks discussed:  Allergic reaction, infection, nerve damage, swelling and unsuccessful block Indications:    Indications:  Procedural anesthesia Location:    Body area:  Upper extremity   Upper extremity nerve blocked: Digital.   Laterality:  Left Pre-procedure details:    Skin preparation:  Alcohol Procedure details (see MAR for exact dosages):    Block needle  gauge:  25 G   Anesthetic injected:  Lidocaine 2% w/o epi   Injection procedure:  Anatomic landmarks identified, anatomic landmarks palpated, negative aspiration for blood, incremental injection and introduced needle Post-procedure details:    Outcome:  Anesthesia achieved   Patient tolerance of procedure:  Tolerated well, no immediate complications .Marland KitchenIncision and Drainage Date/Time: 03/19/2016 10:39 AM Performed by: Lorayne Bender Authorized by: Lorayne Bender   Consent:    Consent obtained:  Verbal   Consent given by:  Patient   Risks discussed:  Bleeding, incomplete drainage, pain and infection Location:    Type:  Abscess   Location:  Upper extremity   Upper extremity location:  Finger   Finger location:  L thumb Pre-procedure details:    Skin preparation:  Betadine Anesthesia (see MAR for exact dosages):    Anesthesia method:  Nerve block   Block needle gauge:  25 G   Block anesthetic:  Lidocaine 2% w/o epi   Block injection procedure:  Anatomic landmarks identified, anatomic landmarks palpated, introduced needle, negative aspiration for blood and incremental injection   Block outcome:  Anesthesia achieved Procedure type:    Complexity:  Simple Procedure details:    Incision types:  Single straight   Incision depth:  Subcutaneous   Scalpel blade:  11   Wound management:  Irrigated with saline   Drainage:  Purulent   Drainage amount:  Moderate   Wound treatment:  Wound left open   Packing materials:   None Post-procedure details:    Patient tolerance of procedure:  Tolerated well, no immediate complications   (including critical care time)  Medications Ordered in ED Medications  lidocaine (XYLOCAINE) 2 % (with pres) injection 200 mg (200 mg Infiltration Given by Other 03/19/16 1119)     Initial Impression / Assessment and Plan / ED Course  I have reviewed the triage vital signs and the nursing notes.  Pertinent labs & imaging results that were available during my care of the patient were reviewed by me and considered in my medical decision making (see chart for details).  Clinical Course     Patient presents with left thumb pain and swelling the last 4 days. Paronychia identified and drained. Home care and return precautions discussed. Patient voiced understanding of all instructions and is comfortable with discharge.  Fluconazole prescribed at patient's request due to recurrent vaginal yeast infections with antibiotic use.  Final Clinical Impressions(s) / ED Diagnoses   Final diagnoses:  Paronychia of finger of left hand    New Prescriptions Discharge Medication List as of 03/19/2016 11:16 AM    START taking these medications   Details  fluconazole (DIFLUCAN) 200 MG tablet Take 1 tablet (200 mg total) by mouth daily., Starting Thu 03/19/2016, Until Sat 03/21/2016, Print    !! HYDROcodone-acetaminophen (NORCO/VICODIN) 5-325 MG tablet Take 1 tablet by mouth every 6 (six) hours as needed., Starting Thu 03/19/2016, Print    naproxen (NAPROSYN) 500 MG tablet Take 1 tablet (500 mg total) by mouth 2 (two) times daily., Starting Thu 03/19/2016, Print    sulfamethoxazole-trimethoprim (BACTRIM DS,SEPTRA DS) 800-160 MG tablet Take 1 tablet by mouth 2 (two) times daily., Starting Thu 03/19/2016, Until Thu 03/26/2016, Print     !! - Potential duplicate medications found. Please discuss with provider.       Lorayne Bender, PA-C 03/20/16 1631    Gareth Morgan, MD 03/22/16  1248

## 2016-03-19 NOTE — Discharge Instructions (Signed)
Please take all of your antibiotics until finished!   You may develop abdominal discomfort or diarrhea from the antibiotic.  You may help offset this with probiotics which you can buy or get in yogurt. Do not eat or take the probiotics until 2 hours after your antibiotic.   Follow-up with your PCP for a wound check next week to assure proper healing. Naproxen or ibuprofen for pain. Vicodin for severe pain. Do not drive or perform other dangerous activities while taking the Vicodin.  Soaking the wound in warm water and Epsom salt solution for 15 minutes twice a day may help healing.  Take the diflucan should symptoms of yeast infection arise. Take the second dose three days after the first dose should symptoms persist.

## 2016-04-15 ENCOUNTER — Encounter: Payer: Self-pay | Admitting: Family Medicine

## 2016-04-15 ENCOUNTER — Ambulatory Visit (INDEPENDENT_AMBULATORY_CARE_PROVIDER_SITE_OTHER): Payer: Self-pay | Admitting: Family Medicine

## 2016-04-15 VITALS — BP 126/70 | HR 99 | Temp 98.9°F | Resp 16 | Ht 69.0 in | Wt 241.0 lb

## 2016-04-15 DIAGNOSIS — Z202 Contact with and (suspected) exposure to infections with a predominantly sexual mode of transmission: Secondary | ICD-10-CM

## 2016-04-15 DIAGNOSIS — A6000 Herpesviral infection of urogenital system, unspecified: Secondary | ICD-10-CM

## 2016-04-15 LAB — HIV ANTIBODY (ROUTINE TESTING W REFLEX): HIV: NONREACTIVE

## 2016-04-15 MED ORDER — VALACYCLOVIR HCL 1 G PO TABS: 1000.0000 mg | ORAL_TABLET | Freq: Every day | ORAL | 11 refills | Status: DC

## 2016-04-15 MED FILL — valACYclovir HCL 1 GM TABS: 1 | 30 days supply | Qty: 30 | Fill #0

## 2016-04-15 NOTE — Patient Instructions (Addendum)

## 2016-04-15 NOTE — Progress Notes (Signed)
   Subjective:    Patient ID: Amanda Davenport, female    DOB: [birth year], 36 y.o.   MRN: [id number]  Exposure to STD   The patient's primary symptoms include a discharge and genital lesions. The patient's pertinent negatives include no dyspareunia, genital itching, genital rash or pelvic pain. This is a recurrent problem. The current episode started more than 1 month ago. The vaginal discharge was clear and copious. Pertinent negatives include no abdominal pain, anorexia, diaphoresis, fever, genital odor, rectal pain or sore throat. She has tried warm baths for the symptoms. The treatment provided no relief. Risk factors include history of STDs (History of herpes simplex virus).    Past Medical History:  Diagnosis Date  . Allergy   . Anemia   . Arrhythmia   . Chronic headache   . Nearsightedness    wears glasses  . Obesity   . Recurrent boils   . WPW (Wolff-Parkinson-White syndrome)     Review of Systems  Constitutional: Negative.  Negative for diaphoresis and fever.  HENT: Negative.  Negative for sore throat.   Respiratory: Negative.   Cardiovascular: Negative.   Gastrointestinal: Negative for abdominal pain, anorexia and rectal pain.  Genitourinary: Positive for genital sores, vaginal discharge and vaginal pain. Negative for dyspareunia and pelvic pain.  Musculoskeletal: Negative.   Allergic/Immunologic: Negative.   Neurological: Negative.   Hematological: Negative.   Psychiatric/Behavioral: Negative.        Objective:   Physical Exam  Constitutional: She is oriented to person, place, and time. She appears well-developed and well-nourished.  HENT:  Head: Normocephalic and atraumatic.  Right Ear: External ear normal.  Left Ear: External ear normal.  Nose: Nose normal.  Mouth/Throat: Oropharynx is clear and moist.  Eyes: Conjunctivae and EOM are normal. Pupils are equal, round, and reactive to light.  Neck: Normal range of motion. Neck supple.  Cardiovascular: Normal  rate, regular rhythm, normal heart sounds and intact distal pulses.   Pulmonary/Chest: Effort normal and breath sounds normal.  Abdominal: Soft. Bowel sounds are normal.  Genitourinary: There is lesion (Fluid filled vessicles too numerous to quantify) on the right labia. There is lesion on the left labia. There is erythema and tenderness in the vagina. Vaginal discharge found.  Musculoskeletal: Normal range of motion.  Neurological: She is alert and oriented to person, place, and time. She has normal reflexes.  Skin: Skin is warm and dry.  Psychiatric: She has a normal mood and affect. Her behavior is normal. Thought content normal.      BP 126/70 (BP Location: Right Arm, Patient Position: Sitting, Cuff Size: Normal)   Pulse 99   Temp 98.9 F (37.2 C) (Oral)   Resp 16   Ht 5\' 9"  ([id number] m)   Wt 241 lb (109.3 kg)   LMP 03/26/2016   BMI 35.59 kg/m  Assessment & Plan:  1. STD exposure Recommend barrier protection with sexual intercourse - GC/Chlamydia Probe Amp - HIV antibody (with reflex) - RPR - HSV(herpes simplex vrs) 1+2 ab-IgG  2. Recurrent genital herpes simplex - valACYclovir (VALTREX) 1000 MG tablet; Take 1 tablet (1,000 mg total) by mouth daily.  Dispense: 30 tablet; Refill: 11   RTC: 3 months for pap smear [name], FNP

## 2016-04-16 ENCOUNTER — Telehealth: Payer: Self-pay | Admitting: Family Medicine

## 2016-04-16 LAB — POCT URINALYSIS DIP (DEVICE)
Bilirubin Urine: NEGATIVE
GLUCOSE, UA: NEGATIVE mg/dL
KETONES UR: NEGATIVE mg/dL
NITRITE: NEGATIVE
PROTEIN: NEGATIVE mg/dL
Specific Gravity, Urine: 1.015 (ref 1.005–1.030)
UROBILINOGEN UA: 0.2 mg/dL (ref 0.0–1.0)
pH: 6.5 (ref 5.0–8.0)

## 2016-04-16 LAB — HSV(HERPES SIMPLEX VRS) I + II AB-IGG: HSV 2 Glycoprotein G Ab, IgG: 8.57 Index — ABNORMAL HIGH (ref <0.90)

## 2016-04-16 LAB — GC/CHLAMYDIA PROBE AMP
CT Probe RNA: NOT DETECTED
GC Probe RNA: NOT DETECTED

## 2016-04-16 LAB — RPR

## 2016-04-16 NOTE — Telephone Encounter (Signed)
Notified patient to discuss laboratory results. 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 Discussed disease process at length. Also, discussed the importance of barrier protection with sexual intercourse. STarted Valtrex 1000 mg daily for HSV suppression. Will follow up in office as previously scheduled.    Current Outpatient Prescriptions:  .  fluticasone (FLONASE) 50 MCG/ACT nasal spray, Place 2 sprays into both nostrils daily as needed for allergies or rhinitis., Disp: , Rfl:  .  ibuprofen (ADVIL,MOTRIN) 200 MG tablet, Take 200 mg by mouth every 6 (six) hours as needed., Disp: , Rfl:  .  indomethacin (INDOCIN) 25 MG capsule, Take 1 capsule (25 mg total) by mouth 3 (three) times daily as needed. Do not take more than needed to help with pain (Patient not taking: Reported on 04/15/2016), Disp: 30 capsule, Rfl: 0 .  naproxen (NAPROSYN) 500 MG tablet, Take 1 tablet (500 mg total) by mouth 2 (two) times daily. (Patient not taking: Reported on 04/15/2016), Disp: 30 tablet, Rfl: 0 .  naproxen sodium (ANAPROX) 220 MG tablet, Take 440 mg by mouth daily as needed (pain). Reported on 06/25/2015, Disp: , Rfl:  .  Prenatal Vit-Fe Fumarate-FA (PRENATAL MULTIVITAMIN) TABS tablet, Take 1 tablet by mouth daily at 12 noon., Disp: , Rfl:  .  triamcinolone cream (KENALOG) 0.1 %, Apply 1 application topically 2 (two) times daily as needed (dry skin). Reported on 06/25/2015, Disp: , Rfl:  .  valACYclovir (VALTREX) 1000 MG tablet, Take 1 tablet (1,000 mg total) by mouth daily., Disp: 30 tablet, Rfl: 70   Parley Pidcock M, FNP

## 2016-05-15 MED FILL — valACYclovir HCL 1 GM TABS: 1 | 30 days supply | Qty: 30 | Fill #1

## 2016-06-17 MED FILL — valACYclovir HCL 1 GM TABS: 1 | 30 days supply | Qty: 30 | Fill #2

## 2016-07-15 ENCOUNTER — Other Ambulatory Visit (HOSPITAL_COMMUNITY)
Admission: RE | Admit: 2016-07-15 | Discharge: 2016-07-15 | Disposition: A | Discharge status: Home / Self Care | Payer: Self-pay | Source: Ambulatory Visit | Attending: Family Medicine | Admitting: Family Medicine

## 2016-07-15 ENCOUNTER — Ambulatory Visit (INDEPENDENT_AMBULATORY_CARE_PROVIDER_SITE_OTHER): Payer: Self-pay | Admitting: Family Medicine

## 2016-07-15 ENCOUNTER — Encounter: Payer: Self-pay | Admitting: Family Medicine

## 2016-07-15 VITALS — BP 98/58 | HR 79 | Temp 98.5°F | Resp 14 | Ht 69.0 in | Wt 232.0 lb

## 2016-07-15 DIAGNOSIS — L732 Hidradenitis suppurativa: Secondary | ICD-10-CM

## 2016-07-15 DIAGNOSIS — R42 Dizziness and giddiness: Secondary | ICD-10-CM

## 2016-07-15 DIAGNOSIS — B9689 Other specified bacterial agents as the cause of diseases classified elsewhere: Secondary | ICD-10-CM | POA: Insufficient documentation

## 2016-07-15 DIAGNOSIS — D508 Other iron deficiency anemias: Secondary | ICD-10-CM

## 2016-07-15 DIAGNOSIS — D509 Iron deficiency anemia, unspecified: Secondary | ICD-10-CM | POA: Diagnosis present

## 2016-07-15 DIAGNOSIS — Z01419 Encounter for gynecological examination (general) (routine) without abnormal findings: Secondary | ICD-10-CM

## 2016-07-15 DIAGNOSIS — N760 Acute vaginitis: Secondary | ICD-10-CM | POA: Insufficient documentation

## 2016-07-15 LAB — COMPLETE METABOLIC PANEL WITH GFR
ALT: 3 U/L — AB (ref 6–29)
AST: 9 U/L — AB (ref 10–30)
Albumin: 3.2 g/dL — ABNORMAL LOW (ref 3.6–5.1)
Alkaline Phosphatase: 61 U/L (ref 33–115)
BILIRUBIN TOTAL: 0.3 mg/dL (ref 0.2–1.2)
BUN: 7 mg/dL (ref 7–25)
CHLORIDE: 108 mmol/L (ref 98–110)
CO2: 23 mmol/L (ref 20–31)
CREATININE: 0.61 mg/dL (ref 0.50–1.10)
Calcium: 8.6 mg/dL (ref 8.6–10.2)
GFR, Est Non African American: >89 mL/min (ref >=60)
GLUCOSE: 80 mg/dL (ref 65–99)
Potassium: 3.9 mmol/L (ref 3.5–5.3)
Sodium: 139 mmol/L (ref 135–146)
TOTAL PROTEIN: 7.8 g/dL (ref 6.1–8.1)

## 2016-07-15 LAB — CBC WITH DIFFERENTIAL/PLATELET
BASOS PCT: 0 %
Basophils Absolute: 0 cells/uL (ref 0–200)
EOS ABS: 162 {cells}/uL (ref 15–500)
Eosinophils Relative: 3 %
HEMATOCRIT: 27 % — AB (ref 35.0–45.0)
Hemoglobin: 7.4 g/dL — ABNORMAL LOW (ref 11.7–15.5)
LYMPHS PCT: 47 %
Lymphs Abs: 2538 cells/uL (ref 850–3900)
MCH: 17.9 pg — ABNORMAL LOW (ref 27.0–33.0)
MCHC: 27.4 g/dL — ABNORMAL LOW (ref 32.0–36.0)
MCV: 65.4 fL — AB (ref 80.0–100.0)
MONO ABS: 216 {cells}/uL (ref 200–950)
MONOS PCT: 4 %
MPV: 7.9 fL (ref 7.5–12.5)
NEUTROS ABS: 2484 {cells}/uL (ref 1500–7800)
Neutrophils Relative %: 46 %
PLATELETS: 611 10*3/uL — AB (ref 140–400)
RBC: 4.13 MIL/uL (ref 3.80–5.10)
RDW: 19.7 % — AB (ref 11.0–15.0)
WBC: 5.4 10*3/uL (ref 3.8–10.8)

## 2016-07-15 LAB — POCT URINALYSIS DIP (DEVICE)
Bilirubin Urine: NEGATIVE
Glucose, UA: NEGATIVE mg/dL
Ketones, ur: NEGATIVE mg/dL
NITRITE: NEGATIVE
PH: 6 (ref 5.0–8.0)
PROTEIN: NEGATIVE mg/dL
SPECIFIC GRAVITY, URINE: 1.025 (ref 1.005–1.030)
UROBILINOGEN UA: 0.2 mg/dL (ref 0.0–1.0)

## 2016-07-15 LAB — GLUCOSE, CAPILLARY: Glucose-Capillary: 87 mg/dL (ref 65–99)

## 2016-07-15 MED ORDER — DOXYCYCLINE HYCLATE 100 MG PO TABS: 100.0000 mg | ORAL_TABLET | Freq: Two times a day (BID) | ORAL | 0 refills | Status: AC

## 2016-07-15 NOTE — Patient Instructions (Addendum)
Hidradentitis suppurativa: Will start Doxycline 100 mg twice daily for 14 days. Will follow up in 1 month. Will send a referral to dermatology for further evaluation.    Will follow up by phone with laboratory results by phone   The patient was given clear instructions to go to ER or return to medical center if symptoms do not improve, worsen or new problems develop. The patient verbalized understanding.    Hidradenitis Suppurativa Hidradenitis suppurativa is a long-term (chronic) skin disease that starts with blocked sweat glands or hair follicles. Bacteria may grow in these blocked openings of your skin. Hidradenitis suppurativa is like a severe form of acne that develops in areas of your body where acne would be unusual. It is most likely to affect the areas of your body where skin rubs against skin and becomes moist. This includes your:  Underarms.  Groin.  Genital areas.  Buttocks.  Upper thighs.  Breasts. Hidradenitis suppurativa may start out with small pimples. The pimples can develop into deep sores that break open (rupture) and drain pus. Over time your skin may thicken and become scarred. Hidradenitis suppurativa cannot be passed from person to person. What are the causes? The exact cause of hidradenitis suppurativa is not known. This condition may be due to:  Female and female hormones. The condition is rare before and after puberty.  An overactive body defense system (immune system). Your immune system may overreact to the blocked hair follicles or sweat glands and cause swelling and pus-filled sores. What increases the risk? You may have a higher risk of hidradenitis suppurativa if you:  Are a woman.  Are between ages 54 and 32.  Have a family history of hidradenitis suppurativa.  Have a personal history of acne.  Are overweight.  Smoke.  Take the drug lithium. What are the signs or symptoms? The first signs of an outbreak are usually painful skin bumps that  look like pimples. As the condition progresses:  Skin bumps may get bigger and grow deeper into the skin.  Bumps under the skin may rupture and drain smelly pus.  Skin may become itchy and infected.  Skin may thicken and scar.  Drainage may continue through tunnels under the skin (fistulas).  Walking and moving your arms can become painful. How is this diagnosed? Your health care provider may diagnose hidradenitis suppurativa based on your medical history and your signs and symptoms. A physical exam will also be done. You may need to see a health care provider who specializes in skin diseases (dermatologist). You may also have tests done to confirm the diagnosis. These can include:  Swabbing a sample of pus or drainage from your skin so it can be sent to the lab and tested for infection.  Blood tests to check for infection. How is this treated? The same treatment will not work for everybody with hidradenitis suppurativa. Your treatment will depend on how severe your symptoms are. You may need to try several treatments to find what works best for you. Part of your treatment may include cleaning and bandaging (dressing) your wounds. You may also have to take medicines, such as the following:  Antibiotics.  Acne medicines.  Medicines to block or suppress the immune system.  A diabetes medicine (metformin) is sometimes used to treat this condition.  For women, birth control pills can sometimes help relieve symptoms. You may need surgery if you have a severe case of hidradenitis suppurativa that does not respond to medicine. Surgery may involve:  Using a laser to clear the skin and remove hair follicles.  Opening and draining deep sores.  Removing the areas of skin that are diseased and scarred. Follow these instructions at home:  Learn as much as you can about your disease, and work closely with your health care providers.  Take medicines only as directed by your health care  provider.  If you were prescribed an antibiotic medicine, finish it all even if you start to feel better.  If you are overweight, losing weight may be very helpful. Try to reach and maintain a healthy weight.  Do not use any tobacco products, including cigarettes, chewing tobacco, or electronic cigarettes. If you need help quitting, ask your health care provider.  Do not shave the areas where you get hidradenitis suppurativa.  Do not wear deodorant.  Wear loose-fitting clothes.  Try not to overheat and get sweaty.  Take a daily bleach bath as directed by your health care provider.  Fill your bathtub halfway with water.  Pour in  cup of unscented household bleach.  Soak for 5-10 minutes.  Cover sore areas with a warm, clean washcloth (compress) for 5-10 minutes. Contact a health care provider if:  You have a flare-up of hidradenitis suppurativa.  You have chills or a fever.  You are having trouble controlling your symptoms at home. This information is not intended to replace advice given to you by your health care provider. Make sure you discuss any questions you have with your health care provider. Document Released: 11/05/2003 Document Revised: 08/29/2015 Document Reviewed: 06/23/2013 Elsevier Interactive Patient Education  2017 [employer].  Iron Deficiency Anemia, Adult Iron-deficiency anemia is when you have a low amount of red blood cells or hemoglobin. This happens because you have too little iron in your body. Hemoglobin carries oxygen to parts of the body. Anemia can cause your body to not get enough oxygen. It may or may not cause symptoms. Follow these instructions at home: Medicines   Take over-the-counter and prescription medicines only as told by your doctor. This includes iron pills (supplements) and vitamins.  If you cannot handle taking iron pills by mouth, ask your doctor about getting iron through:  A vein (intravenously).  A shot (injection) into a  muscle.  Take iron pills when your stomach is empty. If you cannot handle this, take them with food.  Do not drink milk or take antacids at the same time as your iron pills.  To prevent trouble pooping (constipation), eat fiber or take medicine (stool softener) as told by your doctor. Eating and drinking   Talk with your doctor before changing the foods you eat. He or she may tell you to eat foods that have a lot of iron, such as:  Liver.  Lowfat (lean) beef.  Breads and cereals that have iron added to them (fortified breads and cereals).  Eggs.  Dried fruit.  Dark green, leafy vegetables.  Drink enough fluid to keep your pee (urine) clear or pale yellow.  Eat fresh fruits and vegetables that are high in vitamin C. They help your body to use iron. Foods with a lot of vitamin C include:  Oranges.  Peppers.  Tomatoes.  Mangoes. General instructions   Return to your normal activities as told by your doctor. Ask your doctor what activities are safe for you.  Keep yourself clean, and keep things clean around you (your surroundings). Anemia can make you get sick more easily.  Keep all follow-up visits as told by  your doctor. This is important. Contact a doctor if:  You feel sick to your stomach (nauseous).  You throw up (vomit).  You feel weak.  You are sweating for no clear reason.  You have trouble pooping, such as:  Pooping (having a bowel movement) less than 3 times a week.  Straining to poop.  Having poop that is hard, dry, or larger than normal.  Feeling full or bloated.  Pain in the lower belly.  Not feeling better after pooping. Get help right away if:  You pass out (faint). If this happens, do not drive yourself to the hospital. Call your local emergency services (911 in the [place]  You have chest pain.  You have shortness of breath that:  Is very bad.  Gets worse with physical activity.  You have a fast heartbeat.  You get light-headed  when getting up from sitting or lying down. This information is not intended to replace advice given to you by your health care provider. Make sure you discuss any questions you have with your health care provider.   Document Released: 04/25/2010 Document Revised: 12/11/2015 Document Reviewed: 12/11/2015 Elsevier Interactive Patient Education  2017 [employer].  Orthostatic Hypotension Orthostatic hypotension is a sudden drop in blood pressure that happens when you quickly change positions, such as when you get up from a seated or lying position. Blood pressure is a measurement of how strongly, or weakly, your blood is pressing against the walls of your arteries. Arteries are blood vessels that carry blood from your heart throughout your body. When blood pressure is too low, you may not get enough blood to your brain or to the rest of your organs. This can cause weakness, light-headedness, rapid heartbeat, and fainting. This can last for just a few seconds or for up to a few minutes. Orthostatic hypotension is usually not a serious problem. However, if it happens frequently or gets worse, it may be a sign of something more serious. What are the causes? This condition may be caused by:  Sudden changes in posture, such as standing up quickly after you have been sitting or lying down.  Blood loss.  Loss of body fluids (dehydration).  Heart problems.  Hormone (endocrine) problems.  Pregnancy.  Severe infection.  Lack of certain nutrients.  Severe allergic reactions (anaphylaxis).  Certain medicines, such as blood pressure medicine or medicines that make the body lose excess fluids (diuretics). Sometimes, this condition can be caused by not taking medicine as directed, such as taking too much of a certain medicine. What increases the risk? Certain factors can make you more likely to develop orthostatic hypotension, including:  Age. Risk increases as you get older.  Conditions that  affect the heart or the central nervous system.  Taking certain medicines, such as blood pressure medicine or diuretics.  Being pregnant. What are the signs or symptoms? Symptoms of this condition may include:  Weakness.  Light-headedness.  Dizziness.  Blurred vision.  Fatigue.  Rapid heartbeat.  Fainting, in severe cases. How is this diagnosed? This condition is diagnosed based on:  Your medical history.  Your symptoms.  Your blood pressure measurement. Your health care provider will check your blood pressure when you are:  Lying down.  Sitting.  Standing. A blood pressure reading is recorded as two numbers, such as "120 over 80" (or 120/80). The first ("top") number is called the systolic pressure. It is a measure of the pressure in your arteries as your heart beats. The second ("bottom")  number is called the diastolic pressure. It is a measure of the pressure in your arteries when your heart relaxes between beats. Blood pressure is measured in a unit called mm Hg. Healthy blood pressure for adults is 120/80. If your blood pressure is below 90/60, you may be diagnosed with hypotension. Other information or tests that may be used to diagnose orthostatic hypotension include:  Your other vital signs, such as your heart rate and temperature.  Blood tests.  Tilt table test. For this test, you will be safely secured to a table that moves you from a lying position to an upright position. Your heart rhythm and blood pressure will be monitored during the test. How is this treated? Treatment for this condition may include:  Changing your diet. This may involve eating more salt (sodium) or drinking more water.  Taking medicines to raise your blood pressure.  Changing the dosage of certain medicines you are taking that might be lowering your blood pressure.  Wearing compression stockings. These stockings help to prevent blood clots and reduce swelling in your legs. In some  cases, you may need to go to the hospital for:  Fluid replacement. This means you will receive fluids through an IV tube.  Blood replacement. This means you will receive donated blood through an IV tube (transfusion).  Treating an infection or heart problems, if this applies.  Monitoring. You may need to be monitored while medicines that you are taking wear off. Follow these instructions at home: Eating and drinking    Drink enough fluid to keep your urine clear or pale yellow.  Eat a healthy diet and follow instructions from your health care provider about eating or drinking restrictions. A healthy diet includes:  Fresh fruits and vegetables.  Whole grains.  Lean meats.  Low-fat dairy products.  Eat extra salt only as directed. Do not add extra salt to your diet unless your health care provider told you to do that.  Eat frequent, small meals.  Avoid standing up suddenly after eating. Medicines   Take over-the-counter and prescription medicines only as told by your health care provider.  Follow instructions from your health care provider about changing the dosage of your current medicines, if this applies.  Do not stop or adjust any of your medicines on your own. General instructions   Wear compression stockings as told by your health care provider.  Get up slowly from lying down or sitting positions. This gives your blood pressure a chance to adjust.  Avoid hot showers and excessive heat as directed by your health care provider.  Return to your normal activities as told by your health care provider. Ask your health care provider what activities are safe for you.  Do not use any products that contain nicotine or tobacco, such as cigarettes and e-cigarettes. If you need help quitting, ask your health care provider.  Keep all follow-up visits as told by your health care provider. This is important. Contact a health care provider if:  You vomit.  You have  diarrhea.  You have a fever for more than 2-3 days.  You feel more thirsty than usual.  You feel weak and tired. Get help right away if:  You have chest pain.  You have a fast or irregular heartbeat.  You develop numbness in any part of your body.  You cannot move your arms or your legs.  You have trouble speaking.  You become sweaty or feel lightheaded.  You faint.  You feel  short of breath.  You have trouble staying awake.  You feel confused. This information is not intended to replace advice given to you by your health care provider. Make sure you discuss any questions you have with your health care provider. Document Released: 03/13/2002 Document Revised: 12/10/2015 Document Reviewed: 09/13/2015 Elsevier Interactive Patient Education  2017 [employer].

## 2016-07-15 NOTE — Progress Notes (Signed)
Subjective:    Patient ID: Amanda Davenport, female    DOB: Jan 10, 1981, 36 y.o.   MRN: [id number]  Gynecologic Exam  The patient's primary symptoms include vaginal discharge. The patient's pertinent negatives include no genital itching, genital lesions, genital odor, genital rash, missed menses, pelvic pain or vaginal bleeding. Progression since onset: History of hidradenitis suppurativa, she has frequent boils to genital area. The problem affects both sides. She is not pregnant. Pertinent negatives include no anorexia, back pain, chills, constipation, diarrhea, discolored urine, dysuria, fever, flank pain, frequency, headaches, hematuria, joint pain, joint swelling, nausea, painful intercourse, rash, sore throat, urgency or vomiting. The vaginal discharge was white. There has been no bleeding. She is sexually active. No, her partner does not have an STD. She uses nothing for contraception. Her menstrual history has been regular (Heavy menses). Her past medical history is significant for herpes simplex and an STD.  Dizziness  This is a new (Amanda Davenport has had several episodes of dizziness with standing) problem. The current episode started in the past 7 days. The problem occurs constantly. The problem has been unchanged. Associated symptoms include fatigue and vertigo. Pertinent negatives include no anorexia, arthralgias, change in bowel habit, chills, fever, headaches, joint swelling, myalgias, nausea, rash, sore throat or vomiting. The symptoms are aggravated by standing. She has tried eating and drinking for the symptoms. The treatment provided mild relief.    Past Medical History:  Diagnosis Date  . Allergy   . Anemia   . Arrhythmia   . Chronic headache   . Nearsightedness    wears glasses  . Obesity   . Recurrent boils   . WPW (Wolff-Parkinson-White syndrome)    Social History   Social History  . Marital status: Single    Spouse name: N/A  . Number of children: N/A  . Years of  education: N/A   Occupational History  . Not on file.   Social History Main Topics  . Smoking status: Former Smoker    Years: 0.50    Types: Cigarettes  . Smokeless tobacco: Never Used     Comment: smokes black and milds  . Alcohol use Yes     Comment: occ  . Drug use: Yes    Types: Marijuana     Comment: daily  . Sexual activity: Yes    Birth control/ protection: None, Condom   Other Topics Concern  . Not on file   Social History Narrative  . No narrative on file   Immunization History  Administered Date(s) Administered  . Tdap 05/20/2015   Review of Systems  Constitutional: Positive for fatigue. Negative for chills and fever.  HENT: Negative for sore throat.   Gastrointestinal: Negative for anorexia, change in bowel habit, constipation, diarrhea, nausea and vomiting.  Endocrine: Negative.  Negative for polydipsia, polyphagia and polyuria.  Genitourinary: Positive for vaginal discharge. Negative for dysuria, flank pain, frequency, hematuria, missed menses, pelvic pain and urgency.  Musculoskeletal: Negative for arthralgias, back pain, joint pain, joint swelling and myalgias.  Skin: Negative for rash.  Neurological: Positive for dizziness and vertigo. Negative for headaches.      Objective:   Physical Exam  HENT:  Head: Normocephalic and atraumatic.  Right Ear: External ear normal.  Left Ear: External ear normal.  Nose: Nose normal.  Mouth/Throat: Oropharynx is clear and moist.  Eyes: Conjunctivae and EOM are normal. Pupils are equal, round, and reactive to light.  Neck: Normal range of motion. Neck supple.  Cardiovascular: Normal rate,  normal heart sounds and intact distal pulses.   Pulmonary/Chest: Effort normal.  Abdominal: Soft. Bowel sounds are normal.  Genitourinary: Uterus normal. There is tenderness and lesion on the right labia. There is lesion on the left labia. Cervix exhibits discharge. Cervix exhibits no motion tenderness and no friability.  There is  a foreign body in the vagina. Vaginal discharge found.  Skin: Skin is warm and dry.  Purulent, tender, malodorous, discharge to axilla bilaterally.     Psychiatric: She has a normal mood and affect. Her behavior is normal. Judgment and thought content normal.     BP (!) 98/58 (BP Location: Left Arm, Patient Position: Sitting, Cuff Size: Large)   Pulse 79   Temp 98.5 F (36.9 C) (Oral)   Resp 14   Ht 5\' 9"  (1.753 m)   Wt 232 lb (105.2 kg)   LMP 07/06/2016   SpO2 100%   BMI 34.26 kg/m  Assessment & Plan:  1. Dizziness She is not currently on any medications that would cause dizziness. Reviewed previous labs, TSH wnl. Patient has a history of iron deficiency anemia, will check CBC w/ differential. Checked orthostatic vital signs. Blood pressure decreased with standing. Discussed orthostatic hypotension at length. Will call patient with laboratory results.   - Orthostatic vital signs - COMPLETE METABOLIC PANEL WITH GFR  2. Other iron deficiency anemia Previous hemoglobin was 8.9, will re-check CBC on today.  - CBC with Differential  3. Hidradenitis suppurativa Patient has recurrent boils/abscesses that are consistent with hidradenitis suppurativa. Axilla consists of boils and visible tracts under skin. Boils are also have minimal drainage. Will start a course of doxycycline. I will also send a referral to dermatology for further evaluation.  - doxycycline (VIBRA-TABS) 100 MG tablet; Take 1 tablet (100 mg total) by mouth 2 (two) times daily.  Dispense: 28 tablet; Refill: 0  4. Cervical smear, as part of routine gynecological examination - Cytology - [facility] [place]   RTC: Will follow up by phone with laboratory results. 1 month for hidradenitis   Amanda Pounds  MSN, FNP-C [facility] [address] [place], [place] [address] [phone]

## 2016-07-17 LAB — CYTOLOGY - PAP
ADEQUACY: ABSENT
BACTERIAL VAGINITIS: NEGATIVE
Candida vaginitis: POSITIVE — AB
Chlamydia: NEGATIVE
DIAGNOSIS: NEGATIVE
Neisseria Gonorrhea: NEGATIVE
TRICH (WINDOWPATH): NEGATIVE

## 2016-07-20 ENCOUNTER — Other Ambulatory Visit: Payer: Self-pay | Admitting: Family Medicine

## 2016-07-20 DIAGNOSIS — B3731 Acute candidiasis of vulva and vagina: Secondary | ICD-10-CM

## 2016-07-20 DIAGNOSIS — B373 Candidiasis of vulva and vagina: Secondary | ICD-10-CM

## 2016-07-20 MED ORDER — FLUCONAZOLE 150 MG PO TABS: 150.0000 mg | ORAL_TABLET | Freq: Once | ORAL | 0 refills | Status: AC

## 2016-07-20 NOTE — Progress Notes (Signed)
Meds ordered this encounter  Medications  . fluconazole (DIFLUCAN) 150 MG tablet    Sig: Take 1 tablet (150 mg total) by mouth once.    Dispense:  2 tablet    Refill:  0     [place]  MSN, FNP-C [facility] [address] [place], [place] [address] [phone]

## 2016-07-21 MED FILL — valACYclovir HCL 1 GM TABS: 1 | 30 days supply | Qty: 30 | Fill #3

## 2016-07-22 ENCOUNTER — Other Ambulatory Visit: Payer: Self-pay | Admitting: Family Medicine

## 2016-07-22 DIAGNOSIS — D649 Anemia, unspecified: Secondary | ICD-10-CM

## 2016-07-22 MED ORDER — FLUCONAZOLE 150 MG PO TABS: 150.0000 mg | ORAL_TABLET | Freq: Once | ORAL | 0 refills | Status: AC

## 2016-07-22 NOTE — Progress Notes (Signed)
Reviewed labs, hemoglibn is 7.4. Will add on a ferritin and TIBC.  [name]  MSN, FNP-C [facility] [address] [place], [place] [address] [phone]

## 2016-07-23 ENCOUNTER — Other Ambulatory Visit: Payer: Self-pay

## 2016-07-23 DIAGNOSIS — D649 Anemia, unspecified: Secondary | ICD-10-CM

## 2016-07-23 LAB — IRON AND TIBC
%SAT: 4 % — ABNORMAL LOW (ref 11–50)
Iron: 13 ug/dL — ABNORMAL LOW (ref 40–190)
TIBC: 352 ug/dL (ref 250–450)
UIBC: 339 ug/dL (ref 125–400)

## 2016-07-23 LAB — CBC
HEMATOCRIT: 27.1 % — AB (ref 35.0–45.0)
Hemoglobin: 7.5 g/dL — ABNORMAL LOW (ref 11.7–15.5)
MCH: 18.1 pg — ABNORMAL LOW (ref 27.0–33.0)
MCHC: 27.7 g/dL — AB (ref 32.0–36.0)
MCV: 65.3 fL — ABNORMAL LOW (ref 80.0–100.0)
MPV: 7.6 fL (ref 7.5–12.5)
PLATELETS: 614 10*3/uL — AB (ref 140–400)
RBC: 4.15 MIL/uL (ref 3.80–5.10)
RDW: 19.6 % — AB (ref 11.0–15.0)
WBC: 8.6 10*3/uL (ref 3.8–10.8)

## 2016-07-24 ENCOUNTER — Other Ambulatory Visit: Payer: Self-pay | Admitting: Family Medicine

## 2016-07-24 LAB — FERRITIN: Ferritin: 3 ng/mL — ABNORMAL LOW (ref 10–154)

## 2016-07-27 ENCOUNTER — Other Ambulatory Visit: Payer: Self-pay | Admitting: Family Medicine

## 2016-07-27 ENCOUNTER — Ambulatory Visit (HOSPITAL_COMMUNITY)
Admission: RE | Admit: 2016-07-27 | Discharge: 2016-07-27 | Disposition: A | Discharge status: Home / Self Care | Payer: Self-pay | Source: Ambulatory Visit | Attending: Family Medicine | Admitting: Family Medicine

## 2016-07-27 ENCOUNTER — Encounter (HOSPITAL_COMMUNITY): Payer: Self-pay

## 2016-07-27 DIAGNOSIS — D509 Iron deficiency anemia, unspecified: Secondary | ICD-10-CM

## 2016-07-27 DIAGNOSIS — D508 Other iron deficiency anemias: Secondary | ICD-10-CM | POA: Insufficient documentation

## 2016-07-27 MED ORDER — SODIUM CHLORIDE 0.9 % IV SOLN
510.0000 mg | Freq: Once | INTRAVENOUS | Status: DC
  Filled 2016-07-27: qty 17

## 2016-07-28 ENCOUNTER — Ambulatory Visit (HOSPITAL_COMMUNITY)
Admission: RE | Admit: 2016-07-28 | Discharge: 2016-07-28 | Disposition: A | Discharge status: Home / Self Care | Payer: Self-pay | Source: Ambulatory Visit | Attending: Family Medicine | Admitting: Family Medicine

## 2016-07-28 VITALS — BP 117/65 | HR 82 | Temp 98.8°F | Resp 16

## 2016-07-28 DIAGNOSIS — D508 Other iron deficiency anemias: Secondary | ICD-10-CM

## 2016-07-28 MED ORDER — SODIUM CHLORIDE 0.9 % IV SOLN
510.0000 mg | Freq: Once | INTRAVENOUS | Status: AC
  Administered 2016-07-28: 510 mg via INTRAVENOUS
  Filled 2016-07-28: qty 17

## 2016-07-28 NOTE — Discharge Instructions (Signed)

## 2016-07-28 NOTE — Progress Notes (Signed)
Provider: Cammie Sickle FNP  Diagnosis: Iron deficiency anemia  Treatment: Feraheme 510 mg in SOudium chloride 0.9% 129ml IVPB  Patient received Feraheme 510 mg in SOudium chloride 0.9% 121ml IVPB. Patient tolerated procedure well with no transfusion reaction. Patient was observed for 30 minutes post transfusion. Vital signs within normal limits at discharge. Discharge instructions given to patient and patient states an understanding.

## 2016-08-14 ENCOUNTER — Encounter: Payer: Self-pay | Admitting: Family Medicine

## 2016-08-14 ENCOUNTER — Ambulatory Visit (INDEPENDENT_AMBULATORY_CARE_PROVIDER_SITE_OTHER): Payer: Self-pay | Admitting: Family Medicine

## 2016-08-14 VITALS — BP 123/77 | HR 86 | Temp 98.7°F | Resp 16 | Ht 69.0 in | Wt 234.0 lb

## 2016-08-14 DIAGNOSIS — N920 Excessive and frequent menstruation with regular cycle: Secondary | ICD-10-CM

## 2016-08-14 DIAGNOSIS — D508 Other iron deficiency anemias: Secondary | ICD-10-CM

## 2016-08-14 LAB — CBC WITH DIFFERENTIAL/PLATELET
Basophils Absolute: 63 cells/uL (ref 0–200)
Basophils Relative: 1 %
EOS PCT: 2 %
Eosinophils Absolute: 126 cells/uL (ref 15–500)
HCT: 32.6 % — ABNORMAL LOW (ref 35.0–45.0)
Hemoglobin: 8.9 g/dL — ABNORMAL LOW (ref 11.7–15.5)
Lymphocytes Relative: 33 %
Lymphs Abs: 2079 cells/uL (ref 850–3900)
MCH: 19.6 pg — AB (ref 27.0–33.0)
MCHC: 27.3 g/dL — AB (ref 32.0–36.0)
MCV: 71.6 fL — ABNORMAL LOW (ref 80.0–100.0)
MONOS PCT: 6 %
MPV: 8 fL (ref 7.5–12.5)
Monocytes Absolute: 378 cells/uL (ref 200–950)
NEUTROS ABS: 3654 {cells}/uL (ref 1500–7800)
Neutrophils Relative %: 58 %
PLATELETS: 577 10*3/uL — AB (ref 140–400)
RBC: 4.55 MIL/uL (ref 3.80–5.10)
RDW: 25.6 % — AB (ref 11.0–15.0)
WBC: 6.3 10*3/uL (ref 3.8–10.8)

## 2016-08-14 MED ORDER — FERROUS SULFATE 325 (65 FE) MG PO TABS: 325.0000 mg | ORAL_TABLET | Freq: Every day | ORAL | 2 refills | Status: DC

## 2016-08-14 NOTE — Patient Instructions (Addendum)
Call on Monday to schedule iron infusion Also, Mickel Baas is to schedule pelvic and transvaginal ultrasound   Iron Deficiency Anemia, Adult Iron-deficiency anemia is when you have a low amount of red blood cells or hemoglobin. This happens because you have too little iron in your body. Hemoglobin carries oxygen to parts of the body. Anemia can cause your body to not get enough oxygen. It may or may not cause symptoms. Follow these instructions at home: Medicines   Take over-the-counter and prescription medicines only as told by your doctor. This includes iron pills (supplements) and vitamins.  If you cannot handle taking iron pills by mouth, ask your doctor about getting iron through:  A vein (intravenously).  A shot (injection) into a muscle.  Take iron pills when your stomach is empty. If you cannot handle this, take them with food.  Do not drink milk or take antacids at the same time as your iron pills.  To prevent trouble pooping (constipation), eat fiber or take medicine (stool softener) as told by your doctor. Eating and drinking   Talk with your doctor before changing the foods you eat. He or she may tell you to eat foods that have a lot of iron, such as:  Liver.  Lowfat (lean) beef.  Breads and cereals that have iron added to them (fortified breads and cereals).  Eggs.  Dried fruit.  Dark green, leafy vegetables.  Drink enough fluid to keep your pee (urine) clear or pale yellow.  Eat fresh fruits and vegetables that are high in vitamin C. They help your body to use iron. Foods with a lot of vitamin C include:  Oranges.  Peppers.  Tomatoes.  Mangoes. General instructions   Return to your normal activities as told by your doctor. Ask your doctor what activities are safe for you.  Keep yourself clean, and keep things clean around you (your surroundings). Anemia can make you get sick more easily.  Keep all follow-up visits as told by your doctor. This is  important. Contact a doctor if:  You feel sick to your stomach (nauseous).  You throw up (vomit).  You feel weak.  You are sweating for no clear reason.  You have trouble pooping, such as:  Pooping (having a bowel movement) less than 3 times a week.  Straining to poop.  Having poop that is hard, dry, or larger than normal.  Feeling full or bloated.  Pain in the lower belly.  Not feeling better after pooping. Get help right away if:  You pass out (faint). If this happens, do not drive yourself to the hospital. Call your local emergency services (911 in the [place]  You have chest pain.  You have shortness of breath that:  Is very bad.  Gets worse with physical activity.  You have a fast heartbeat.  You get light-headed when getting up from sitting or lying down. This information is not intended to replace advice given to you by your health care provider. Make sure you discuss any questions you have with your health care provider. Document Released: 04/25/2010 Document Revised: 12/11/2015 Document Reviewed: 12/11/2015 Elsevier Interactive Patient Education  2017 [employer].

## 2016-08-16 NOTE — Progress Notes (Deleted)
[name], a 36 year old female with a history of iron deficiency anemia presents for a follow up. Patient had a feraheme infusion on 07/28/2016.  Anemia was found by routine CBC.  It has been present for several weeks.  Patient endorses periodic fatigue. Sentinal symptom the patient feels fatigue began with: exercise intolerance and cold intolerance, constipation and change in hair texture.. She endorses heavy menstrual cycles periodically.  Symptoms of her fatigue have been lack of interest in usual activities. She says that she has been feeling "sluggish" at work.  Patient denies significant change in weight, symptoms of arthritis, unusual rashes and witnessed or suspected sleep apnea.  Past Medical History:  Diagnosis Date  . Allergy   . Anemia   . Arrhythmia   . Chronic headache   . Nearsightedness    wears glasses  . Obesity   . Recurrent boils   . WPW (Wolff-Parkinson-White syndrome)    Social History   Social History  . Marital status: Single    Spouse name: N/A  . Number of children: N/A  . Years of education: N/A   Occupational History  . Not on file.   Social History Main Topics  . Smoking status: Former Smoker    Years: 0.50    Types: Cigarettes  . Smokeless tobacco: Never Used     Comment: smokes black and milds  . Alcohol use Yes     Comment: occ  . Drug use: Yes    Types: Marijuana     Comment: daily  . Sexual activity: Yes    Birth control/ protection: None, Condom   Other Topics Concern  . Not on file   Social History Narrative  . No narrative on file   Immunization History  Administered Date(s) Administered  . Tdap 05/20/2015   Allergies  Allergen Reactions  . Other Other (See Comments)    All Antibiotics cause severe vaginal yeast infections  . Penicillins Hives, Itching and Swelling    Has patient had a PCN reaction causing immediate rash, facial/tongue/throat swelling, SOB or lightheadedness with hypotension: Yes Has patient had a PCN  reaction causing severe rash involving mucus membranes or skin necrosis: Yes Has patient had a PCN reaction that required hospitalization Yes Has patient had a PCN reaction occurring within the last 10 years: Yes If all of the above answers are "NO", then may proceed with Cephalosporin use.   . Shellfish-Derived Products Hives  . Shrimp [Shellfish Allergy] Hives  . Sulfa Antibiotics Rash   Review of Systems  Constitutional: Positive for malaise/fatigue. Negative for chills, fever and weight loss.  HENT: Negative.   Eyes: Negative.   Respiratory: Negative.   Cardiovascular: Negative.   Gastrointestinal: Negative.   Genitourinary: Negative.        Periodic heavy menstrual cycles  Musculoskeletal: Negative.   Skin:       Hidradenitis suppurativa, primarily to groin  Neurological: Negative.   Endo/Heme/Allergies: Negative.   Psychiatric/Behavioral: Negative.    Physical Exam  Constitutional: She is well-developed, well-nourished, and in no distress.  HENT:  Head: Normocephalic and atraumatic.  Right Ear: External ear normal.  Left Ear: External ear normal.  Nose: Nose normal.  Mouth/Throat: Oropharynx is clear and moist.  Eyes: Conjunctivae are normal. Pupils are equal, round, and reactive to light.  Cardiovascular: Normal rate, regular rhythm, normal heart sounds and intact distal pulses.   Pulmonary/Chest: Effort normal and breath sounds normal.  Abdominal: Soft. Bowel sounds are normal.  Musculoskeletal: Normal range  of motion.  Neurological: She is alert. Gait normal.  Skin: Skin is warm and dry.  Psychiatric: Memory, affect and judgment normal.   BP 123/77 (BP Location: Right Arm, Patient Position: Sitting, Cuff Size: Normal)   Pulse 86   Temp 98.7 F (37.1 C) (Oral)   Resp 16   Ht 5\' 9"  (1.753 m)   Wt 234 lb (106.1 kg)   LMP 07/31/2016   SpO2 100%   BMI 34.56 kg/m   Plan   1. Other iron deficiency anemia Patient had a colonoscop - ferrous sulfate 325 (65  FE) MG tablet; Take 1 tablet (325 mg total) by mouth daily with breakfast.  Dispense: 30 tablet; Refill: 2 - CBC with Differential

## 2016-08-16 NOTE — Progress Notes (Signed)
Patient ID: Amanda Davenport, female   DOB: [birth year], 36 y.o.   MRN: [id number] Amanda Davenport, a 36 year old female with a history of iron deficiency anemia presents for a follow up. Patient had a feraheme infusion on 07/28/2016.  Anemia was found by routine CBC.  It has been present for several weeks.  Patient endorses periodic fatigue. Sentinal symptom the patient feels fatigue began with: exercise intolerance and cold intolerance, constipation and change in hair texture.. She endorses heavy menstrual cycles periodically.  Symptoms of her fatigue have been lack of interest in usual activities. She says that she has been feeling "sluggish" at work.  Patient denies significant change in weight, symptoms of arthritis, unusual rashes and witnessed or suspected sleep apnea.  Past Medical History:  Diagnosis Date  . Allergy   . Anemia   . Arrhythmia   . Chronic headache   . Nearsightedness    wears glasses  . Obesity   . Recurrent boils   . WPW (Wolff-Parkinson-White syndrome)    Social History   Social History  . Marital status: Single    Spouse name: N/A  . Number of children: N/A  . Years of education: N/A   Occupational History  . Not on file.   Social History Main Topics  . Smoking status: Former Smoker    Years: 0.50    Types: Cigarettes  . Smokeless tobacco: Never Used     Comment: smokes black and milds  . Alcohol use Yes     Comment: occ  . Drug use: Yes    Types: Marijuana     Comment: daily  . Sexual activity: Yes    Birth control/ protection: None, Condom   Other Topics Concern  . Not on file   Social History Narrative  . No narrative on file   Immunization History  Administered Date(s) Administered  . Tdap 05/20/2015   Allergies  Allergen Reactions  . Other Other (See Comments)    All Antibiotics cause severe vaginal yeast infections  . Penicillins Hives, Itching and Swelling    Has patient had a PCN reaction causing immediate rash,  facial/tongue/throat swelling, SOB or lightheadedness with hypotension: Yes Has patient had a PCN reaction causing severe rash involving mucus membranes or skin necrosis: Yes Has patient had a PCN reaction that required hospitalization Yes Has patient had a PCN reaction occurring within the last 10 years: Yes If all of the above answers are "NO", then may proceed with Cephalosporin use.   . Shellfish-Derived Products Hives  . Shrimp [Shellfish Allergy] Hives  . Sulfa Antibiotics Rash   Review of Systems  Constitutional: Positive for malaise/fatigue. Negative for chills, fever and weight loss.  HENT: Negative.   Eyes: Negative.   Cardiovascular: Negative.   Gastrointestinal: Negative.   Genitourinary: Negative.        Periodic heavy menstrual cycles  Skin:       Hidradenitis suppurativa, primarily to groin    Physical Exam  Constitutional: She is oriented to person, place, and time and well-developed, well-nourished, and in no distress.  HENT:  Head: Normocephalic and atraumatic.  Right Ear: External ear normal.  Left Ear: External ear normal.  Nose: Nose normal.  Mouth/Throat: Oropharynx is clear and moist.  Eyes: Conjunctivae are normal. Pupils are equal, round, and reactive to light.  Neck: Normal range of motion. Neck supple.  Pulmonary/Chest: Effort normal and breath sounds normal.  Abdominal: Soft. Bowel sounds are normal.  Musculoskeletal: Normal range of motion.  Neurological: She is alert and oriented to person, place, and time. Gait normal.  Skin: Skin is warm and dry.  Psychiatric: Mood, memory, affect and judgment normal.    BP 123/77 (BP Location: Right Arm, Patient Position: Sitting, Cuff Size: Normal)   Pulse 86   Temp 98.7 F (37.1 C) (Oral)   Resp 16   Ht 5\' 9"  (1.753 m)   Wt 234 lb (106.1 kg)   LMP 07/31/2016   SpO2 100%   BMI 34.56 kg/m  Plan   1. Other iron deficiency anemia Amanda Davenport had a colonoscopy several years ago with polyps. She denies  blood in stool or urine. She reports heavy menstrual cycles periodically. Will check hemoglobin and hematocrit on today. Will continue ferrous sulfate. Will call office to schedule second iron infusion on next week.  - ferrous sulfate 325 (65 FE) MG tablet; Take 1 tablet (325 mg total) by mouth daily with breakfast.  Dispense: 30 tablet; Refill: 2 - CBC with Differential  2. Menorrhagia with regular cycle  - US Pelvis Complete; Future - US Transvaginal Non-OB; Future   RTC: Will schedule follow up following irone infusion   [name]  MSN, FNP-C [facility] [place], [place] [id number] [phone]    R

## 2016-08-16 NOTE — Addendum Note (Signed)
Encounter addended by: [name], FNP on: 08/16/2016  8:08 PM<BR>    Actions taken: Problem List modified

## 2016-08-18 ENCOUNTER — Other Ambulatory Visit: Payer: Self-pay | Admitting: Family Medicine

## 2016-08-18 ENCOUNTER — Ambulatory Visit (HOSPITAL_COMMUNITY)
Admission: RE | Admit: 2016-08-18 | Discharge: 2016-08-18 | Disposition: A | Discharge status: Home / Self Care | Payer: BLUE CROSS/BLUE SHIELD | Source: Ambulatory Visit | Attending: Family Medicine | Admitting: Family Medicine

## 2016-08-18 DIAGNOSIS — D509 Iron deficiency anemia, unspecified: Secondary | ICD-10-CM | POA: Diagnosis not present

## 2016-08-18 DIAGNOSIS — N83201 Unspecified ovarian cyst, right side: Secondary | ICD-10-CM | POA: Diagnosis not present

## 2016-08-18 DIAGNOSIS — N920 Excessive and frequent menstruation with regular cycle: Secondary | ICD-10-CM | POA: Diagnosis not present

## 2016-08-18 DIAGNOSIS — D508 Other iron deficiency anemias: Secondary | ICD-10-CM | POA: Diagnosis present

## 2016-08-18 MED ORDER — FERUMOXYTOL INJECTION 510 MG/17 ML
510.0000 mg | Freq: Once | INTRAVENOUS | Status: AC
  Administered 2016-08-18: 510 mg via INTRAVENOUS
  Filled 2016-08-18: qty 17

## 2016-08-18 NOTE — Progress Notes (Signed)
Provider: Cammie Sickle FNP  Diagnosis: Iron deficiency anemia  Treatment: Feraheme 510 mg in Sodium chloride 0.9% 149ml IVPB  Patient received Feraheme 510 mg in Sodium chloride 0.9% 1104ml IVPB. Patient tolerated procedure well with no transfusion reaction. Patient was observed for 30 minutes post transfusion. Vital signs within normal limits at discharge. Discharge instructions given to patient and patient states an understanding.

## 2016-08-18 NOTE — Discharge Instructions (Signed)

## 2016-08-24 MED FILL — valACYclovir HCL 1 GM TABS: 1 | 30 days supply | Qty: 30 | Fill #4

## 2016-09-07 ENCOUNTER — Telehealth: Payer: Self-pay

## 2016-09-07 NOTE — Telephone Encounter (Signed)
Received a fax from [facility] patient no showed appointment.

## 2016-09-28 ENCOUNTER — Ambulatory Visit (HOSPITAL_COMMUNITY)
Admission: EM | Admit: 2016-09-28 | Discharge: 2016-09-28 | Disposition: A | Discharge status: Home / Self Care | Payer: Self-pay | Attending: Family Medicine | Admitting: Family Medicine

## 2016-09-28 ENCOUNTER — Encounter (HOSPITAL_COMMUNITY): Payer: Self-pay | Admitting: Emergency Medicine

## 2016-09-28 DIAGNOSIS — M67472 Ganglion, left ankle and foot: Secondary | ICD-10-CM

## 2016-09-28 MED ORDER — PREDNISONE 10 MG (21) PO TBPK: ORAL_TABLET | Freq: Every day | ORAL | 0 refills | Status: DC

## 2016-09-28 MED FILL — predniSONE 10 MG TABS: 10 | 12 days supply | Qty: 42 | Fill #0

## 2016-09-28 NOTE — ED Provider Notes (Signed)
CSN: [id number]     Arrival date & time 09/28/16  1119 History   First MD Initiated Contact with Patient 09/28/16 1151     Chief Complaint  Patient presents with  . Foot Pain   (Consider location/radiation/quality/duration/timing/severity/associated sxs/prior Treatment) Amanda Davenport is a 36 y.o. female with a past history of anemia, obesity, and arrhythmia, who presents to the [facility] with a chief complaint of left foot pain ongoing for 4 days, she denies any traumatic cause, she has not fallen, twisted her ankle, or had any sources of trauma. She does work 2 different jobs, both in the [employer], she is on her feet daily from early morning to late at night. His no systemic symptoms such as fever, chills, nausea, vomiting, or any other constitutional symptoms. Has not tried any over-the-counter therapies.   The history is provided by the patient.  Foot Pain     Past Medical History:  Diagnosis Date  . Allergy   . Anemia   . Arrhythmia   . Chronic headache   . Nearsightedness    wears glasses  . Obesity   . Recurrent boils   . WPW (Wolff-Parkinson-White syndrome)    Past Surgical History:  Procedure Laterality Date  . CYSTECTOMY     tonsils   Family History  Problem Relation Age of Onset  . Breast cancer Mother   . Pulmonary embolism Mother        died of PE  . Colon cancer Mother   . Irritable bowel syndrome Mother   . Diabetes Paternal Grandmother   . Heart disease Neg Hx   . Stroke Neg Hx    Social History  Substance Use Topics  . Smoking status: Former Smoker    Years: 0.50    Types: Cigarettes  . Smokeless tobacco: Never Used     Comment: smokes black and milds  . Alcohol use Yes     Comment: occ   OB History    Gravida Para Term Preterm AB Living   0             SAB TAB Ectopic Multiple Live Births                 Review of Systems  Constitutional: Negative.   HENT: Negative.   Respiratory: Negative.   Cardiovascular:  Negative.   Gastrointestinal: Negative.   Musculoskeletal: Negative for back pain, neck pain and neck stiffness.       Foot pain  Skin: Negative.   Neurological: Negative.     Allergies  Other; Penicillins; Shellfish-derived products; Shrimp [shellfish allergy]; and Sulfa antibiotics  Home Medications   Prior to Admission medications   Medication Sig Start Date End Date Taking? Authorizing Provider  predniSONE (STERAPRED UNI-PAK 21 TAB) 10 MG (21) TBPK tablet Take by mouth daily. Take 6 tabs by mouth daily  for 2 days, then 5 tabs for 2 days, then 4 tabs for 2 days, then 3 tabs for 2 days, 2 tabs for 2 days, then 1 tab by mouth daily for 2 days 09/28/16   Barnet Glasgow, NP  valACYclovir (VALTREX) 1000 MG tablet Take 1 tablet (1,000 mg total) by mouth daily. 04/15/16   [name], FNP   Meds Ordered and Administered this Visit  Medications - No data to display  BP 118/73 (BP Location: Right Arm)   Pulse 87   Temp 99 F (37.2 C) (Oral)   Resp 18   SpO2  100%  No data found.   Physical Exam  Constitutional: She is oriented to person, place, and time. She appears well-developed and well-nourished. No distress.  HENT:  Head: Normocephalic and atraumatic.  Right Ear: External ear normal.  Left Ear: External ear normal.  Eyes: Conjunctivae are normal.  Neck: Normal range of motion.  Musculoskeletal: She exhibits tenderness.       Left foot: There is tenderness and swelling.       Feet:  Neurological: She is alert and oriented to person, place, and time.  Skin: Skin is warm and dry. Capillary refill takes less than 2 seconds. No rash noted. She is not diaphoretic. No erythema.  Psychiatric: She has a normal mood and affect. Her behavior is normal.  Nursing note and vitals reviewed.   Urgent Care Course     Procedures (including critical care time)  Labs Review Labs Reviewed - No data to display  Imaging Review No results found.     MDM   1. Ganglion  cyst of left foot    Amanda Davenport is a 36 y.o. female with a past history of anemia, obesity, and arrhythmia, who presents to the [facility] urgent care with a chief complaint of left foot pain ongoing for 4 days, she denies any traumatic cause, she has not fallen, twisted her ankle, or had any sources of trauma. She does work 2 different jobs, both in the [employer], she is on her feet daily from early morning to late at night. His no systemic symptoms such as fever, chills, nausea, vomiting, or any other constitutional symptoms. Has not tried any over-the-counter therapies.  Based on signs, symptoms, and physical exam findings, most likely diagnosis is a ganglionic cyst. Recommend rest, ice, compression, elevation, provided work note, given a short course of prednisone, and referral made to podiatry.   Barnet Glasgow, NP 09/28/16 1205

## 2016-09-28 NOTE — Discharge Instructions (Signed)
For your pain, I recommend rest, ice, elevation whenever possible, and compression of the foot to wearing of a Ace bandage as needed. I prescribed high-dose steroid, take this as directed, and I provided the contact information for a podiatrist, I recommend you contact his office to schedule an appointment for follow-up care.

## 2016-09-28 NOTE — ED Triage Notes (Signed)
[facility] with a complaint of left foot pain x 4 days. The patient denied any known injury.

## 2016-10-24 ENCOUNTER — Emergency Department (HOSPITAL_COMMUNITY)
Admission: EM | Admit: 2016-10-24 | Discharge: 2016-10-24 | Disposition: A | Discharge status: Home / Self Care | Payer: BLUE CROSS/BLUE SHIELD | Attending: Emergency Medicine | Admitting: Emergency Medicine

## 2016-10-24 ENCOUNTER — Encounter (HOSPITAL_COMMUNITY): Payer: Self-pay

## 2016-10-24 ENCOUNTER — Emergency Department (HOSPITAL_COMMUNITY): Payer: BLUE CROSS/BLUE SHIELD

## 2016-10-24 DIAGNOSIS — Z79899 Other long term (current) drug therapy: Secondary | ICD-10-CM | POA: Insufficient documentation

## 2016-10-24 DIAGNOSIS — M79672 Pain in left foot: Secondary | ICD-10-CM

## 2016-10-24 DIAGNOSIS — Z87891 Personal history of nicotine dependence: Secondary | ICD-10-CM | POA: Insufficient documentation

## 2016-10-24 DIAGNOSIS — M722 Plantar fascial fibromatosis: Secondary | ICD-10-CM | POA: Insufficient documentation

## 2016-10-24 MED ORDER — PREDNISONE 10 MG PO TABS: 40.0000 mg | ORAL_TABLET | Freq: Every day | ORAL | 0 refills | Status: AC

## 2016-10-24 NOTE — ED Provider Notes (Signed)
[facility] DEPT Provider Note   CSN: [id number] Arrival date & time: 10/24/16  0700     History   Chief Complaint Chief Complaint  Patient presents with  . Foot Pain    HPI Amanda Davenport is a 36 y.o. female.  HPI 36 year old female presents with concern for left foot pain. Patient reports that she had similar foot pain at the end of June, received steroids, and had felt improved. She reports that the pain is severe, particularly when she goes from sitting to standing, and when she first gets up in the morning. Reports that as she walks on it it becomes more tolerable. Reports that because of the severity of her pain and difficulty walking she has been unable to go to work. Reports she has been icing it, using ibuprofen without significant relief. The pain had improved then worsened 1 week ago. Reports now associated knee pain.   Past Medical History:  Diagnosis Date  . Allergy   . Anemia   . Arrhythmia   . Chronic headache   . Nearsightedness    wears glasses  . Obesity   . Recurrent boils   . WPW (Wolff-Parkinson-White syndrome)     Patient Active Problem List   Diagnosis Date Noted  . 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 Absolute anemia 05/21/2015  . Neck strain 12/11/2014  . Hydradenitis 12/11/2014  . Dental caries 12/11/2014  . Constipation 12/03/2014  . Neck pain 12/03/2014  . Myalgia and myositis 11/27/2014  . 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 Morbid obesity ([place]) 11/27/2014  . Hematochezia 12/16/2012  . Wolff-Parkinson-White (WPW) syndrome 03/01/2012  . Iron deficiency anemia 02/16/2012  . Tachycardia 01/28/2012  . Hidradenitis suppurativa 08/18/2011  . AXILLARY ABSCESS 12/30/2006  . ANKLE PAIN 10/11/2006    Past Surgical History:  Procedure Laterality Date  . CYSTECTOMY     tonsils    OB History    Gravida Para Term Preterm AB Living   0             SAB TAB Ectopic Multiple Live Births                   Home  Medications    Prior to Admission medications   Medication Sig Start Date End Date Taking? Authorizing Provider  predniSONE (DELTASONE) 10 MG tablet Take 4 tablets (40 mg total) by mouth daily. 10/24/16 10/28/16  Gareth Morgan, MD  valACYclovir (VALTREX) 1000 MG tablet Take 1 tablet (1,000 mg total) by mouth daily. 04/15/16   [name], FNP    Family History Family History  Problem Relation Age of Onset  . Breast cancer Mother   . Pulmonary embolism Mother        died of PE  . Colon cancer Mother   . Irritable bowel syndrome Mother   . Diabetes Paternal Grandmother   . Heart disease Neg Hx   . Stroke Neg Hx     Social History Social History  Substance Use Topics  . Smoking status: Former Smoker    Years: 0.50    Types: Cigarettes  . Smokeless tobacco: Never Used     Comment: smokes black and milds  . Alcohol use Yes     Comment: occ     Allergies   Other; Penicillins; Shellfish-derived products; Shrimp [shellfish allergy]; and Sulfa antibiotics   Review of Systems Review of Systems  Constitutional: Negative for fever.  Respiratory: Negative for cough.   Gastrointestinal: Negative for nausea and vomiting.  Musculoskeletal: Positive for arthralgias and gait problem.  Skin: Negative for wound.  Neurological: Negative for headaches.     Physical Exam Updated Vital Signs BP 122/75 (BP Location: Left Arm)   Pulse 86   Temp 98.1 F (36.7 C) (Oral)   Resp 16   Ht 5\' 9"  (1.753 m)   Wt 108 kg (238 lb)   LMP 10/11/2016   SpO2 99%   BMI 35.15 kg/m   Physical Exam  Constitutional: She is oriented to person, place, and time. She appears well-developed and well-nourished. No distress.  HENT:  Head: Normocephalic and atraumatic.  Eyes: Conjunctivae are normal.  Neck: Normal range of motion.  Cardiovascular: Normal rate, regular rhythm, normal heart sounds and intact distal pulses.  Exam reveals no gallop and no friction rub.   No murmur  heard. Pulmonary/Chest: Effort normal and breath sounds normal. No respiratory distress. She has no wheezes. She has no rales.  Abdominal: Soft. She exhibits no distension. There is no tenderness. There is no guarding.  Musculoskeletal: She exhibits no edema.       Left ankle: She exhibits normal range of motion, no swelling and no ecchymosis.       Left foot: There is tenderness (plantar surface). There is no swelling and normal capillary refill.  Neurological: She is alert and oriented to person, place, and time. GCS eye subscore is 4. GCS verbal subscore is 5. GCS motor subscore is 6.  Skin: Skin is warm and dry. No rash noted. She is not diaphoretic. No erythema.  Nursing note and vitals reviewed.    ED Treatments / Results  Labs (all labs ordered are listed, but only abnormal results are displayed) Labs Reviewed - No data to display  EKG  EKG Interpretation None       Radiology Dg Foot Complete Left  Result Date: 10/24/2016 CLINICAL DATA:  36 year old female with pain along the lateral aspect of the fifth metatarsal for the past month EXAM: LEFT FOOT - COMPLETE 3+ VIEW COMPARISON:  Prior radiographs of the left ankle 07/08/2011 FINDINGS: There is no evidence of fracture or dislocation. There is no evidence of arthropathy or other focal bone abnormality. Soft tissues are unremarkable. IMPRESSION: Negative. Electronically Signed   By: Jacqulynn Cadet M.D.   On: 10/24/2016 08:16    Procedures Procedures (including critical care time)  Medications Ordered in ED Medications - No data to display   Initial Impression / Assessment and Plan / ED Course  I have reviewed the triage vital signs and the nursing notes.  Pertinent labs & imaging results that were available during my care of the patient were reviewed by me and considered in my medical decision making (see chart for details).     36yo female Presents with concern for left foot pain. X-ray shows no evidence of stress  back fracture or other abnormalities. She has good pulses bilaterally, doubt acute arterial occlusion. No swelling or lower external knee pain, doubt DVT. No signs of cellulitis or infection on exam. History and physical exam are most consistent with plantar fasciitis. Discussed exercises, attached information for rehabilitation, and recommend follow-up with podiatry. Patient reports she's had some relief with steroids in the past, and she is given a short course of prednisone.  Recommend ibuprofen and Tylenol for pain. Patient discharged in stable condition with understanding of reasons to return.   Final Clinical Impressions(s) / ED Diagnoses   Final diagnoses:  Left foot pain  Plantar fasciitis of left foot  New Prescriptions New Prescriptions   PREDNISONE (DELTASONE) 10 MG TABLET    Take 4 tablets (40 mg total) by mouth daily.     Gareth Morgan, MD 10/24/16 [phone]

## 2016-10-24 NOTE — ED Triage Notes (Signed)
Pt seen on 6/25 for same.  Pt has continued left foot pain.  No injury.  Pain is when standing.  meds not helping.  Given steroids on previous visit.

## 2016-10-26 MED FILL — valACYclovir HCL 1 GM TABS: 1 | 30 days supply | Qty: 30 | Fill #5

## 2016-11-07 ENCOUNTER — Emergency Department (HOSPITAL_COMMUNITY)
Admission: EM | Admit: 2016-11-07 | Discharge: 2016-11-07 | Disposition: A | Discharge status: Home / Self Care | Payer: BLUE CROSS/BLUE SHIELD | Attending: Emergency Medicine | Admitting: Emergency Medicine

## 2016-11-07 ENCOUNTER — Encounter (HOSPITAL_COMMUNITY): Payer: Self-pay | Admitting: Emergency Medicine

## 2016-11-07 DIAGNOSIS — Z79899 Other long term (current) drug therapy: Secondary | ICD-10-CM | POA: Insufficient documentation

## 2016-11-07 DIAGNOSIS — R6884 Jaw pain: Secondary | ICD-10-CM | POA: Insufficient documentation

## 2016-11-07 DIAGNOSIS — M722 Plantar fascial fibromatosis: Secondary | ICD-10-CM

## 2016-11-07 DIAGNOSIS — F17210 Nicotine dependence, cigarettes, uncomplicated: Secondary | ICD-10-CM | POA: Insufficient documentation

## 2016-11-07 MED ORDER — CLINDAMYCIN HCL 300 MG PO CAPS: 300.0000 mg | ORAL_CAPSULE | Freq: Four times a day (QID) | ORAL | 0 refills | Status: DC

## 2016-11-07 MED ORDER — NAPROXEN 500 MG PO TABS: 500.0000 mg | ORAL_TABLET | Freq: Two times a day (BID) | ORAL | 0 refills | Status: DC

## 2016-11-07 MED ORDER — TRAMADOL HCL 50 MG PO TABS: 50.0000 mg | ORAL_TABLET | Freq: Four times a day (QID) | ORAL | 0 refills | Status: DC | PRN

## 2016-11-07 NOTE — ED Triage Notes (Signed)
Pt states she is having pain in her left foot  Pt states she has plantar fascitis and has been given steroids in the past that usually helps but she completed them 2 weeks ago and has not had any relief   Pt also states she has been unable to open her mouth very much for the past week  Pt states she has pain on the right side of her face from her jaw to her ear  Pt states it is very difficult to eat or chew

## 2016-11-07 NOTE — Discharge Instructions (Signed)
Naproxen as prescribed. Tramadol as prescribed as needed for pain not relieved with naproxen.  Clindamycin as prescribed.  Follow-up with orthopedics if not improving in the next several days. The contact information for Dr. Marcelino Scot has been provided in this discharge summary for you to call and make these arrangements.

## 2016-11-07 NOTE — ED Provider Notes (Signed)
[facility] DEPT Provider Note   CSN: [id number] Arrival date & time: 11/07/16  0706     History   Chief Complaint Chief Complaint  Patient presents with  . Foot Pain  . Dental Pain    HPI Amanda Davenport is a 36 y.o. female.  Patient is a 36 year old female with no significant past medical history presenting for evaluation of left foot pain and right jaw pain. She has been seen here on 2 prior occasions for her foot pain and was told she likely has plantar fascitis. She was treated with steroid, however this has not helped. She denies any new injury or trauma. She does report being on her feet for long periods at work.  She is also reporting pain to her right mandible/jaw area. This began in the absence of any injury or trauma. She reports pain with opening or closing her mouth and limited ability to chew. The pain radiates to her ear.   The history is provided by the patient.  Foot Pain  This is a new problem. Episode onset: Several weeks ago. The problem occurs constantly. The problem has been gradually worsening. The symptoms are aggravated by walking. Nothing relieves the symptoms. She has tried nothing for the symptoms. The treatment provided no relief.    Past Medical History:  Diagnosis Date  . Allergy   . Anemia   . Arrhythmia   . Chronic headache   . Nearsightedness    wears glasses  . Obesity   . Recurrent boils   . WPW (Wolff-Parkinson-White syndrome)     Patient Active Problem List   Diagnosis Date Noted  . 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 Absolute anemia 05/21/2015  . Neck strain 12/11/2014  . Hydradenitis 12/11/2014  . Dental caries 12/11/2014  . Constipation 12/03/2014  . Neck pain 12/03/2014  . Myalgia and myositis 11/27/2014  . 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 Morbid obesity ([place]) 11/27/2014  . Hematochezia 12/16/2012  . Wolff-Parkinson-White (WPW) syndrome 03/01/2012  . Iron deficiency anemia 02/16/2012  . Tachycardia  01/28/2012  . Hidradenitis suppurativa 08/18/2011  . AXILLARY ABSCESS 12/30/2006  . ANKLE PAIN 10/11/2006    Past Surgical History:  Procedure Laterality Date  . CYSTECTOMY     tonsils    OB History    Gravida Para Term Preterm AB Living   0             SAB TAB Ectopic Multiple Live Births                   Home Medications    Prior to Admission medications   Medication Sig Start Date End Date Taking? Authorizing Provider  valACYclovir (VALTREX) 1000 MG tablet Take 1 tablet (1,000 mg total) by mouth daily. 04/15/16   [name], FNP    Family History Family History  Problem Relation Age of Onset  . Breast cancer Mother   . Pulmonary embolism Mother        died of PE  . Colon cancer Mother   . Irritable bowel syndrome Mother   . Cancer Mother   . Diabetes Paternal Grandmother   . Heart disease Neg Hx   . Stroke Neg Hx     Social History Social History  Substance Use Topics  . Smoking status: Current Some Day Smoker    Years: 0.50    Types: Cigarettes  . Smokeless tobacco: Never Used     Comment: smokes black and milds  . Alcohol use  No     Comment: occ     Allergies   Other; Penicillins; Shellfish-derived products; Shrimp [shellfish allergy]; and Sulfa antibiotics   Review of Systems Review of Systems  All other systems reviewed and are negative.    Physical Exam Updated Vital Signs BP 113/81 (BP Location: Left Arm)   Pulse 81   Temp 98.6 F (37 C) (Oral)   Resp 16   Ht 5\' 9"  (1.753 m)   Wt 106.6 kg (235 lb)   LMP 11/06/2016 (Exact Date)   SpO2 100%   BMI 34.70 kg/m   Physical Exam  Constitutional: She is oriented to person, place, and time. She appears well-developed and well-nourished. No distress.  HENT:  Head: Normocephalic and atraumatic.  Mouth/Throat: Oropharynx is clear and moist.  There are missing right upper molars, however no significant swelling, gingival inflammation, or evidence for abscess. There is tenderness to  palpation to the soft tissues anterior to the right ear extending to the angle of the mandible.  The right TM is erythematous  Neck: Normal range of motion. Neck supple.  Cardiovascular: Normal rate.   Pulmonary/Chest: Effort normal.  Musculoskeletal:  Left foot appears grossly normal. There is tenderness to palpation to the plantar surface.  Lymphadenopathy:    She has no cervical adenopathy.  Neurological: She is alert and oriented to person, place, and time.  Skin: Skin is warm and dry. She is not diaphoretic.  Nursing note and vitals reviewed.    ED Treatments / Results  Labs (all labs ordered are listed, but only abnormal results are displayed) Labs Reviewed - No data to display  EKG  EKG Interpretation None       Radiology No results found.  Procedures Procedures (including critical care time)  Medications Ordered in ED Medications - No data to display   Initial Impression / Assessment and Plan / ED Course  I have reviewed the triage vital signs and the nursing notes.  Pertinent labs & imaging results that were available during my care of the patient were reviewed by me and considered in my medical decision making (see chart for details).  Patient with apparent plantar fasciitis. This will be treated with naproxen. She also has dental pain and a possible middle ear infection. This will be treated with amoxicillin. She is to follow-up with orthopedics if not improving.  Final Clinical Impressions(s) / ED Diagnoses   Final diagnoses:  None    New Prescriptions New Prescriptions   No medications on file     Veryl Speak, MD 11/07/16 [phone]

## 2016-11-14 ENCOUNTER — Emergency Department (HOSPITAL_COMMUNITY)
Admission: EM | Admit: 2016-11-14 | Discharge: 2016-11-14 | Disposition: A | Discharge status: Home / Self Care | Payer: BLUE CROSS/BLUE SHIELD | Attending: Emergency Medicine | Admitting: Emergency Medicine

## 2016-11-14 ENCOUNTER — Encounter (HOSPITAL_COMMUNITY): Payer: Self-pay | Admitting: Emergency Medicine

## 2016-11-14 ENCOUNTER — Emergency Department (HOSPITAL_COMMUNITY): Payer: BLUE CROSS/BLUE SHIELD

## 2016-11-14 DIAGNOSIS — F17210 Nicotine dependence, cigarettes, uncomplicated: Secondary | ICD-10-CM | POA: Insufficient documentation

## 2016-11-14 DIAGNOSIS — K029 Dental caries, unspecified: Secondary | ICD-10-CM | POA: Insufficient documentation

## 2016-11-14 DIAGNOSIS — M25572 Pain in left ankle and joints of left foot: Secondary | ICD-10-CM

## 2016-11-14 DIAGNOSIS — Z79899 Other long term (current) drug therapy: Secondary | ICD-10-CM | POA: Insufficient documentation

## 2016-11-14 LAB — BASIC METABOLIC PANEL
ANION GAP: 9 (ref 5–15)
BUN: 12 mg/dL (ref 6–20)
CALCIUM: 9.2 mg/dL (ref 8.9–10.3)
CO2: 22 mmol/L (ref 22–32)
Chloride: 105 mmol/L (ref 101–111)
Creatinine, Ser: 1.11 mg/dL — ABNORMAL HIGH (ref 0.44–1.00)
GFR calc Af Amer: >60 mL/min (ref >60)
GFR calc non Af Amer: >60 mL/min (ref >60)
GLUCOSE: 103 mg/dL — AB (ref 65–99)
Potassium: 3.4 mmol/L — ABNORMAL LOW (ref 3.5–5.1)
Sodium: 136 mmol/L (ref 135–145)

## 2016-11-14 LAB — CBC
HEMATOCRIT: 32.8 % — AB (ref 36.0–46.0)
HEMOGLOBIN: 10.2 g/dL — AB (ref 12.0–15.0)
MCH: 23.9 pg — AB (ref 26.0–34.0)
MCHC: 31.1 g/dL (ref 30.0–36.0)
MCV: 77 fL — ABNORMAL LOW (ref 78.0–100.0)
Platelets: 497 10*3/uL — ABNORMAL HIGH (ref 150–400)
RBC: 4.26 MIL/uL (ref 3.87–5.11)
RDW: 18 % — ABNORMAL HIGH (ref 11.5–15.5)
WBC: 10.2 10*3/uL (ref 4.0–10.5)

## 2016-11-14 LAB — I-STAT BETA HCG BLOOD, ED (MC, WL, AP ONLY)

## 2016-11-14 LAB — I-STAT TROPONIN, ED: Troponin i, poc: 0.01 ng/mL (ref 0.00–0.08)

## 2016-11-14 MED ORDER — MORPHINE SULFATE (PF) 4 MG/ML IV SOLN
4.0000 mg | Freq: Once | INTRAVENOUS | Status: AC
  Administered 2016-11-14: 4 mg via INTRAVENOUS
  Filled 2016-11-14: qty 1

## 2016-11-14 MED ORDER — CLINDAMYCIN HCL 300 MG PO CAPS: 300.0000 mg | ORAL_CAPSULE | Freq: Three times a day (TID) | ORAL | 0 refills | Status: DC

## 2016-11-14 MED ORDER — CLINDAMYCIN PHOSPHATE 600 MG/50ML IV SOLN
600.0000 mg | Freq: Once | INTRAVENOUS | Status: AC
  Administered 2016-11-14: 600 mg via INTRAVENOUS
  Filled 2016-11-14: qty 50

## 2016-11-14 MED ORDER — HYDROCODONE-ACETAMINOPHEN 5-325 MG PO TABS: 1.0000 | ORAL_TABLET | Freq: Four times a day (QID) | ORAL | 0 refills | Status: DC | PRN

## 2016-11-14 MED ORDER — IBUPROFEN 800 MG PO TABS: 800.0000 mg | ORAL_TABLET | Freq: Three times a day (TID) | ORAL | 0 refills | Status: DC

## 2016-11-14 MED ORDER — SODIUM CHLORIDE 0.9 % IV BOLUS (SEPSIS)
1000.0000 mL | Freq: Once | INTRAVENOUS | Status: AC
Start: 2016-11-14 — End: 2016-11-14
  Administered 2016-11-14: 1000 mL via INTRAVENOUS

## 2016-11-14 NOTE — Discharge Instructions (Signed)
Take motrin for pain.   Take vicodin for severe pain.   Take clindamycin as prescribed.   See your doctor. You need to call dentist on Monday for appointment   Return to ER if you have worse facial swelling, severe foot pain, fever, vomiting, trouble swallowing, trouble breathing

## 2016-11-14 NOTE — ED Triage Notes (Signed)
Pt states seen at [facility] and diagnosed with abscess to cheek. Prescribed antibiotics but has not taken them and states "I think they were just trying to get me in and out so I am not filling the antibiotics because I dont think I have that. " Pt also states the pain to cheek has gotten worse and is up into right ear. Pt states during work began feeling like her heart was racing at 1050. Left work (needs note). No pain currently.

## 2016-11-14 NOTE — ED Provider Notes (Signed)
[facility] DEPT Provider Note   CSN: [id number] Arrival date & time: 11/14/16  1131     History   Chief Complaint Chief Complaint  Patient presents with  . Chest Pain  . Jaw Pain  . Otalgia    HPI Amanda Davenport is a 36 y.o. female history of poor dentition, chronic headaches here presenting with persistent facial swelling, right ear pain, foot pain. Patient was seen at [facility] about week ago and was diagnosed with otitis media, possible periapical abscess and was sent home with clindamycin (due to multiple drug allergies). Patient did not fill her clindamycin dose wanted a second opinion. She was at work and her face was more painful and she then had palpitations. Denies chest pain or shortness of breath. Subjective chills, no fevers. Also had more bilateral foot pain, was recently diagnosed with plantar fasciitis.   The history is provided by the patient.    Past Medical History:  Diagnosis Date  . Allergy   . Anemia   . Arrhythmia   . Chronic headache   . Nearsightedness    wears glasses  . Obesity   . Recurrent boils   . WPW (Wolff-Parkinson-White syndrome)     Patient Active Problem List   Diagnosis Date Noted  . 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 Absolute anemia 05/21/2015  . Neck strain 12/11/2014  . Hydradenitis 12/11/2014  . Dental caries 12/11/2014  . Constipation 12/03/2014  . Neck pain 12/03/2014  . Myalgia and myositis 11/27/2014  . 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 Morbid obesity ([place]) 11/27/2014  . Hematochezia 12/16/2012  . Wolff-Parkinson-White (WPW) syndrome 03/01/2012  . Iron deficiency anemia 02/16/2012  . Tachycardia 01/28/2012  . Hidradenitis suppurativa 08/18/2011  . AXILLARY ABSCESS 12/30/2006  . ANKLE PAIN 10/11/2006    Past Surgical History:  Procedure Laterality Date  . CYSTECTOMY     tonsils    OB History    Gravida Para Term Preterm AB Living   0             SAB TAB Ectopic Multiple Live  Births                   Home Medications    Prior to Admission medications   Medication Sig Start Date End Date Taking? Authorizing Provider  acetaminophen (TYLENOL) 650 MG CR tablet Take 1,300 mg by mouth 2 (two) times daily.   Yes [provider]  ibuprofen (ADVIL,MOTRIN) 200 MG tablet Take 400-800 mg by mouth every 6 (six) hours as needed for mild pain.   Yes [provider]  naproxen (NAPROSYN) 500 MG tablet Take 1 tablet (500 mg total) by mouth 2 (two) times daily with a meal. 11/07/16  Yes Delo, Nathaneil Canary, MD  traMADol (ULTRAM) 50 MG tablet Take 1 tablet (50 mg total) by mouth every 6 (six) hours as needed. 11/07/16  Yes Delo, Nathaneil Canary, MD  valACYclovir (VALTREX) 1000 MG tablet Take 1 tablet (1,000 mg total) by mouth daily. 04/15/16  Yes [name], FNP  clindamycin (CLEOCIN) 300 MG capsule Take 1 capsule (300 mg total) by mouth 4 (four) times daily. X 7 days Patient not taking: Reported on 11/14/2016 11/07/16   Veryl Speak, MD    Family History Family History  Problem Relation Age of Onset  . Breast cancer Mother   . Pulmonary embolism Mother        died of PE  . Colon cancer Mother   . Irritable bowel syndrome Mother   .  Cancer Mother   . Diabetes Paternal Grandmother   . Heart disease Neg Hx   . Stroke Neg Hx     Social History Social History  Substance Use Topics  . Smoking status: Current Some Day Smoker    Years: 0.50    Types: Cigarettes  . Smokeless tobacco: Never Used     Comment: smokes black and milds  . Alcohol use No     Comment: occ     Allergies   Other; Penicillins; Shellfish-derived products; Shrimp [shellfish allergy]; and Sulfa antibiotics   Review of Systems Review of Systems  HENT: Positive for ear pain.   Cardiovascular: Positive for palpitations.  Musculoskeletal:       Bilateral foot pain   All other systems reviewed and are negative.    Physical Exam Updated Vital Signs BP 104/65   Pulse 72   Temp 98.7 F  (37.1 C) (Oral)   Resp (!) 25   Ht 5\' 9"  (1.753 m)   Wt 106.6 kg (235 lb)   LMP 11/06/2016 (Exact Date)   SpO2 98%   BMI 34.70 kg/m   Physical Exam  Constitutional: She is oriented to person, place, and time.  Slightly uncomfortable   HENT:  Head: Normocephalic.  Bilateral TM nl. Mild R cheek swelling. There are multiple missing teeth. Fullness of the gums around the R upper canine area (tooth was missing there). No focal peri apical abscess. Fullness around the parotid area but no obvious cellulitis or fluctuance there. Posterior pharynx nl, tonsils not enlarged, uvula midline   Eyes: Pupils are equal, round, and reactive to light. EOM are normal.  Neck:  R cervical LAD   Cardiovascular: Normal rate, regular rhythm and normal heart sounds.   Pulmonary/Chest: Effort normal and breath sounds normal. No respiratory distress. She has no wheezes.  Abdominal: Soft. Bowel sounds are normal.  Musculoskeletal:  Mild tenderness plantar aspect L foot, R ankle slightly swollen and tender. Neurovascular intact   Neurological: She is alert and oriented to person, place, and time.  Skin: Skin is warm.  Psychiatric: She has a normal mood and affect.  Nursing note and vitals reviewed.    ED Treatments / Results  Labs (all labs ordered are listed, but only abnormal results are displayed) Labs Reviewed  BASIC METABOLIC PANEL - Abnormal; Notable for the following:       Result Value   Potassium 3.4 (*)    Glucose, Bld 103 (*)    Creatinine, Ser 1.11 (*)    All other components within normal limits  CBC - Abnormal; Notable for the following:    Hemoglobin 10.2 (*)    HCT 32.8 (*)    MCV 77.0 (*)    MCH 23.9 (*)    RDW 18.0 (*)    Platelets 497 (*)    All other components within normal limits  I-STAT TROPONIN, ED  I-STAT BETA HCG BLOOD, ED ([facility], [facility], [facility] ONLY)    EKG  EKG Interpretation None       Radiology Dg Chest 2 View  Result Date: 11/14/2016 CLINICAL DATA:  Tachycardia.   Recently treated for cheek abscess. EXAM: CHEST  2 VIEW COMPARISON:  08/16/2013 FINDINGS: Lungs are adequately inflated and otherwise clear. Mild stable cardiomegaly. Remainder the exam is unchanged. IMPRESSION: No active cardiopulmonary disease. Mild stable cardiomegaly. Electronically Signed   By: Marin Olp M.D.   On: 11/14/2016 12:09    Procedures Procedures (including critical care time)  Medications Ordered in ED Medications  clindamycin (CLEOCIN) IVPB 600 mg (0 mg Intravenous Stopped 11/14/16 1320)  sodium chloride 0.9 % bolus 1,000 mL (1,000 mLs Intravenous New Bag/Given 11/14/16 1242)  morphine 4 MG/ML injection 4 mg (4 mg Intravenous Given 11/14/16 1244)     Initial Impression / Assessment and Plan / ED Course  I have reviewed the triage vital signs and the nursing notes.  Pertinent labs & imaging results that were available during my care of the patient were reviewed by me and considered in my medical decision making (see chart for details).    Amanda Davenport is a 36 y.o. female here with R facial swelling, bilateral foot pain. Has poor dentition overall, missing some teeth. I think likely dental infection vs parotitis. No evidence of ludwig or PTA or RPA. Also has L plantar fasciitis. R ankle slightly swollen, maybe early gout? Will get CBC. Will give IV clinda, which she didn't take. Since there is dental infection, I don't think I want to give her steroids for presumed gout and will just treat with pain meds, abx for now.   3:09 PM WBC nl. CXR clear. Given clindamycin IV. Will dc home with clinda, vicodin for pain.    Final Clinical Impressions(s) / ED Diagnoses   Final diagnoses:  None    New Prescriptions New Prescriptions   No medications on file     Drenda Freeze, MD 11/14/16 [phone]

## 2016-11-26 ENCOUNTER — Encounter (HOSPITAL_COMMUNITY): Payer: Self-pay

## 2016-11-26 ENCOUNTER — Ambulatory Visit (HOSPITAL_COMMUNITY)
Admission: EM | Admit: 2016-11-26 | Discharge: 2016-11-26 | Disposition: A | Discharge status: Home / Self Care | Payer: Self-pay | Attending: Emergency Medicine | Admitting: Emergency Medicine

## 2016-11-26 ENCOUNTER — Emergency Department (HOSPITAL_COMMUNITY): Payer: Self-pay

## 2016-11-26 ENCOUNTER — Encounter (HOSPITAL_COMMUNITY): Payer: Self-pay | Admitting: Emergency Medicine

## 2016-11-26 ENCOUNTER — Inpatient Hospital Stay (HOSPITAL_COMMUNITY)
Admission: EM | Admit: 2016-11-26 | Discharge: 2016-11-29 | DRG: 158 | Disposition: A | Discharge status: Home / Self Care | Payer: Self-pay | Attending: Internal Medicine | Admitting: Internal Medicine

## 2016-11-26 DIAGNOSIS — Z880 Allergy status to penicillin: Secondary | ICD-10-CM

## 2016-11-26 DIAGNOSIS — K029 Dental caries, unspecified: Secondary | ICD-10-CM | POA: Diagnosis present

## 2016-11-26 DIAGNOSIS — L03211 Cellulitis of face: Secondary | ICD-10-CM | POA: Diagnosis present

## 2016-11-26 DIAGNOSIS — H7090 Unspecified mastoiditis, unspecified ear: Secondary | ICD-10-CM | POA: Diagnosis present

## 2016-11-26 DIAGNOSIS — Z91013 Allergy to seafood: Secondary | ICD-10-CM

## 2016-11-26 DIAGNOSIS — K047 Periapical abscess without sinus: Principal | ICD-10-CM | POA: Diagnosis present

## 2016-11-26 DIAGNOSIS — E669 Obesity, unspecified: Secondary | ICD-10-CM | POA: Diagnosis present

## 2016-11-26 DIAGNOSIS — Z881 Allergy status to other antibiotic agents status: Secondary | ICD-10-CM

## 2016-11-26 DIAGNOSIS — Z882 Allergy status to sulfonamides status: Secondary | ICD-10-CM

## 2016-11-26 DIAGNOSIS — Z6834 Body mass index (BMI) 34.0-34.9, adult: Secondary | ICD-10-CM

## 2016-11-26 DIAGNOSIS — Z79899 Other long term (current) drug therapy: Secondary | ICD-10-CM

## 2016-11-26 DIAGNOSIS — I456 Pre-excitation syndrome: Secondary | ICD-10-CM | POA: Diagnosis present

## 2016-11-26 DIAGNOSIS — H9201 Otalgia, right ear: Secondary | ICD-10-CM

## 2016-11-26 DIAGNOSIS — R6884 Jaw pain: Secondary | ICD-10-CM

## 2016-11-26 DIAGNOSIS — H6091 Unspecified otitis externa, right ear: Secondary | ICD-10-CM | POA: Diagnosis present

## 2016-11-26 DIAGNOSIS — R220 Localized swelling, mass and lump, head: Secondary | ICD-10-CM

## 2016-11-26 DIAGNOSIS — H6090 Unspecified otitis externa, unspecified ear: Secondary | ICD-10-CM

## 2016-11-26 DIAGNOSIS — M722 Plantar fascial fibromatosis: Secondary | ICD-10-CM | POA: Diagnosis present

## 2016-11-26 DIAGNOSIS — Z791 Long term (current) use of non-steroidal anti-inflammatories (NSAID): Secondary | ICD-10-CM

## 2016-11-26 DIAGNOSIS — F17210 Nicotine dependence, cigarettes, uncomplicated: Secondary | ICD-10-CM | POA: Diagnosis present

## 2016-11-26 LAB — BASIC METABOLIC PANEL
Anion gap: 6 (ref 5–15)
BUN: 8 mg/dL (ref 6–20)
CO2: 25 mmol/L (ref 22–32)
Calcium: 8.9 mg/dL (ref 8.9–10.3)
Chloride: 106 mmol/L (ref 101–111)
Creatinine, Ser: 0.62 mg/dL (ref 0.44–1.00)
GFR calc Af Amer: >60 mL/min (ref >60)
GFR calc non Af Amer: >60 mL/min (ref >60)
Glucose, Bld: 92 mg/dL (ref 65–99)
Potassium: 3.8 mmol/L (ref 3.5–5.1)
Sodium: 137 mmol/L (ref 135–145)

## 2016-11-26 LAB — CBC WITH DIFFERENTIAL/PLATELET
Basophils Absolute: 0.1 10*3/uL (ref 0.0–0.1)
Basophils Relative: 1 %
Eosinophils Absolute: 0.2 10*3/uL (ref 0.0–0.7)
Eosinophils Relative: 2 %
HCT: 28.5 % — ABNORMAL LOW (ref 36.0–46.0)
Hemoglobin: 9.1 g/dL — ABNORMAL LOW (ref 12.0–15.0)
Lymphocytes Relative: 34 %
Lymphs Abs: 3.4 10*3/uL (ref 0.7–4.0)
MCH: 24 pg — ABNORMAL LOW (ref 26.0–34.0)
MCHC: 31.9 g/dL (ref 30.0–36.0)
MCV: 75.2 fL — ABNORMAL LOW (ref 78.0–100.0)
Monocytes Absolute: 0.5 10*3/uL (ref 0.1–1.0)
Monocytes Relative: 5 %
Neutro Abs: 6.1 10*3/uL (ref 1.7–7.7)
Neutrophils Relative %: 58 %
Platelets: 568 10*3/uL — ABNORMAL HIGH (ref 150–400)
RBC: 3.79 MIL/uL — ABNORMAL LOW (ref 3.87–5.11)
RDW: 17.3 % — ABNORMAL HIGH (ref 11.5–15.5)
WBC: 10.3 10*3/uL (ref 4.0–10.5)

## 2016-11-26 MED ORDER — IBUPROFEN 800 MG PO TABS
800.0000 mg | ORAL_TABLET | Freq: Once | ORAL | Status: AC
  Administered 2016-11-26: 800 mg via ORAL
  Filled 2016-11-26: qty 1

## 2016-11-26 MED ORDER — IOPAMIDOL (ISOVUE-300) INJECTION 61%
INTRAVENOUS | Status: AC
Start: 2016-11-26 — End: 2016-11-26
  Administered 2016-11-26: 75 mL
  Filled 2016-11-26: qty 75

## 2016-11-26 NOTE — ED Provider Notes (Signed)
Medical screening examination/treatment/procedure(s) were conducted as a shared visit with non-physician practitioner(s) and myself.  I personally evaluated the patient during the encounter.   EKG Interpretation None     Patient reports about 2-1/2 weeks ago she developed discomfort in her ear that was she with amoxicillin. She did not improve and her face started to get swollen. Right-sided facial swelling continued to get worse until it was painful and difficult to open her mouth. On examination the patient is nontoxic. She does have evident, moderate facial swelling particularly anterior to the ear. This area is tender and pain with range of motion of the jaw. Patient has teeth that are decayed and eroded to the gumline at approximately the first and second molars. CT scan has confirmed apical abscess as well as temporomandibular joint erosion concerning for septic arthritis. Patient is are eventrated outpatient with clindamycin without resolution. I agree with plan of management.   Charlesetta Shanks, MD 11/27/16 [phone]

## 2016-11-26 NOTE — ED Triage Notes (Signed)
Right ear, face pain and swelling for 2 weeks.  Was seen in ed 11/14/16 and treated as abscess.  Patient says swelling did go down slightly, but continues to have difficulty moving lower jaw.

## 2016-11-26 NOTE — H&P (Signed)
History and Physical    Amanda Davenport YIR:[id number] DOB: 1981/03/03 DOA: 11/26/2016  PCP: [name], FNP Consultants:  None Patient coming from:  Home - lives alone; NOK: dad, [phone]  Chief Complaint: facial pain and swelling  HPI: Amanda Davenport is a 36 y.o. female with medical history significant of WPW (not regularly followed by cardiology) and anemia requiring periodic transfusions presenting with right facial pain/swelling.  "My mouth has been giving me problems for 2 weeks".  Came in on 8/4 and was diagnosed with a possible middle ear infection and given amoxicillin.  She didn't think she had an infection and so she didn't take it.  She came back and again on 8/11 and there was concern for dental infection vs. parotitis.  She was given Clindamycin IV x 1 and then PO and she reports having completed the course and it didn't work.  She is now unable to open her mouth very far, can't eat, and has severe pain in ear and head.  She left work early today because it was affecting her speech.  Low-grade fevers but she has been taking plenty of tylenol motrin (also with foot pain from plantar fasciitis).   ED Course: PA Hedges worked so hard to help.  He initially tried to call the oral surgeon but none was on call.  He called ENT who recommended oral surgery consultation.  He eventually located Dr. Stefanie Libel, who was on call during the day and who would not be available for the remainder of the night or the weekend.  He then called [facility] but there was no on-call oral surgeon there.  He [facility] and he was "adamant that he would not accept this patient in transfer and that this was a system issue with incontinent needed resolved within our system."  He then called [facility] and spoke with Dr. Zigmund Daniel who suggested that the patient did not require immediate intervention and so declined to accept the patient.  He recommend Vanc and Flagyl here.  At this point, I was  reconsulted and spoke with the patient, counseling her about risks/benefits of staying her vs. Transferring.  She preferred transfer to [facility].  By this time PA Marlon Pel had taken over and he again called [facility] to try to facilitate transfer and they refused the patient, requesting that we admit her for IV antibiotics.  Review of Systems: As per HPI; otherwise review of systems reviewed and negative.   Ambulatory Status:  Ambulates without assistance  Past Medical History:  Diagnosis Date  . Allergy   . Anemia    receives transfusions periodically  . Arrhythmia   . Chronic headache   . Nearsightedness    wears glasses  . Obesity   . Recurrent boils   . WPW (Wolff-Parkinson-White syndrome)     Past Surgical History:  Procedure Laterality Date  . TONSILLECTOMY      Social History   Social History  . Marital status: Single    Spouse name: N/A  . Number of children: N/A  . Years of education: N/A   Occupational History  . Not on file.   Social History Main Topics  . Smoking status: Current Some Day Smoker    Years: 0.50    Types: Cigarettes  . Smokeless tobacco: Never Used     Comment: smokes black and milds - last use early-mid August  . Alcohol use No     Comment: occ  . Drug use: Yes  Types: Marijuana     Comment: 2+ times per month  . Sexual activity: Yes    Birth control/ protection: None, Condom   Other Topics Concern  . Not on file   Social History Narrative  . No narrative on file    Allergies  Allergen Reactions  . Other Other (See Comments)    All Antibiotics cause severe vaginal yeast infections  . Penicillins Hives, Itching and Swelling    Has patient had a PCN reaction causing immediate rash, facial/tongue/throat swelling, SOB or lightheadedness with hypotension: Yes Has patient had a PCN reaction causing severe rash involving mucus membranes or skin necrosis: Yes Has patient had a PCN reaction that required hospitalization Yes Has patient had a  PCN reaction occurring within the last 10 years: Yes If all of the above answers are "NO", then may proceed with Cephalosporin use.   . Shellfish-Derived Products Hives  . Shrimp [Shellfish Allergy] Hives  . Sulfa Antibiotics Rash    Family History  Problem Relation Age of Onset  . Breast cancer Mother   . Pulmonary embolism Mother        died of PE  . Colon cancer Mother   . Irritable bowel syndrome Mother   . Cancer Mother   . Diabetes Paternal Grandmother   . Heart disease Neg Hx   . Stroke Neg Hx     Prior to Admission medications   Medication Sig Start Date End Date Taking? Authorizing Provider  acetaminophen (TYLENOL) 650 MG CR tablet Take 1,300 mg by mouth 2 (two) times daily.    [provider]  clindamycin (CLEOCIN) 300 MG capsule Take 1 capsule (300 mg total) by mouth 3 (three) times daily. X 7 days 11/14/16   Drenda Freeze, MD  HYDROcodone-acetaminophen (NORCO/VICODIN) 5-325 MG tablet Take 1 tablet by mouth every 6 (six) hours as needed. 11/14/16   Drenda Freeze, MD  ibuprofen (ADVIL,MOTRIN) 800 MG tablet Take 1 tablet (800 mg total) by mouth 3 (three) times daily. 11/14/16   Drenda Freeze, MD  naproxen (NAPROSYN) 500 MG tablet Take 1 tablet (500 mg total) by mouth 2 (two) times daily with a meal. 11/07/16   Veryl Speak, MD  traMADol (ULTRAM) 50 MG tablet Take 1 tablet (50 mg total) by mouth every 6 (six) hours as needed. 11/07/16   Veryl Speak, MD  valACYclovir (VALTREX) 1000 MG tablet Take 1 tablet (1,000 mg total) by mouth daily. 04/15/16   [name], FNP    Physical Exam: Vitals:   11/26/16 1232 11/26/16 1501 11/26/16 1929 11/26/16 2253  BP: 112/74 122/81 116/82 (!) 112/57  Pulse: 80 74 75 67  Resp: 18 16 16 18   Temp: 99.2 F (37.3 C)     TempSrc: Oral     SpO2: 100% 100% 100% 100%  Weight: 108 kg (238 lb)     Height: 5\' 9"  (1.753 m)        General:  Appears calm and comfortable and is NAD Eyes:  PERRL, EOMI, normal lids,  iris ENT: She has an apparent area of dental necrosis along the upper molars with missing teeth.  She also has marked TTP and edema extending along the upper jaw primarily and into the R TMJ region. Neck:  no LAD, masses or thyromegaly; no carotid bruits Cardiovascular:  RRR, no m/r/g. No LE edema.  Respiratory:   CTA bilaterally with no wheezes/rales/rhonchi.  Normal respiratory effort. Abdomen:  soft, NT, ND, NABS Back:   normal alignment,  no CVAT Skin:  no rash or induration seen on limited exam Musculoskeletal:  grossly normal tone BUE/BLE, good ROM, no bony abnormality Psychiatric:  grossly normal mood and affect, speech fluent and appropriate, AOx3 Neurologic:  CN 2-12 grossly intact, moves all extremities in coordinated fashion, sensation intact    Radiological Exams on Admission: Ct Maxillofacial W Contrast  Result Date: 11/26/2016 CLINICAL DATA:  RIGHT ear and facial pain, swelling for 2 weeks. Treated for abscess November 14, 2016. EXAM: CT MAXILLOFACIAL WITH CONTRAST TECHNIQUE: Multidetector CT imaging of the maxillofacial structures was performed with intravenous contrast. Multiplanar CT image reconstructions were also generated. CONTRAST:  57mL ISOVUE-300 IOPAMIDOL (ISOVUE-300) INJECTION 61% COMPARISON:  CT HEAD November 07, 2014 FINDINGS: OSSEOUS: The mandible is intact, the condyles are located. Slight cortical irregularity RIGHT condyle and slight right temporomandibular joint effusion. No acute facial fracture. Tooth 4 and 5 periapical lucencies and dental caries. ORBITS: Ocular globes and orbital contents are normal. SINUSES: Mild paranasal sinus mucosal thickening. Soft tissue narrowing the RIGHT external auditory canal that erosions. Trace middle ear soft tissue in, RIGHT mastoid effusion. Nasal septum is midline. Included mastoid aircells are well aerated. SOFT TISSUES: RIGHT facial soft tissue swelling, subcutaneous fat stranding extending to RIGHT temporomandibular joint. Enlarged  RIGHT pterygoid muscles with effacement of the fat planes. Effusion underlying the RIGHT masseter muscle without rim enhancement. LIMITED INTRACRANIAL: Normal. IMPRESSION: RIGHT facial cellulitis contiguous with RIGHT temporomandibular joint space. Slight possible erosions RIGHT mandible condyle associated with extensive inflammatory changes RIGHT masticator space concerning for septic arthritis versus inflammatory arthritis. No drainable fluid collection. RIGHT otitis externa, middle ear and mastoid effusion. Tooth 4 and 5 (RIGHT maxillary) dental caries and periapical abscess. Electronically Signed   By: Elon Alas M.D.   On: 11/26/2016 18:58    EKG: Not done   Labs on Admission: I have personally reviewed the available labs and imaging studies at the time of the admission.  Pertinent labs:   Hgb 9.1, prior 10.2 on 8/11 Platelets 568, prior 497 on 8/11   Assessment/Plan Principal Problem:   Dental abscess Active Problems:   Wolff-Parkinson-White (WPW) syndrome   Dental abscess -Patient with poor dentition presenting for her 3rd ER visit this month due to right sided facial pain and swelling -Her dental issue appears to start in the right upper gum at the molars and extend back to the TMJ region on exam -CT correlates with the exam, showing right facial cellulitis contiguous with R TMJ and slight possible erosions of the R mandible condyle with extensive inflammatory changes in the R masticator space concerning for septic arthritis -Based on this information, my recommendation was for the patient to be transferred to a facility that has oral surgery but also ENT so that she would be sure to get adequate evaluation and treatment -Unfortunately, despite valiant efforts, this was not able to be accomplished tonight -At the recommendation from oral surgery at [facility], will observe the patient here with IV Vanc and Flagyl and see if she improves -Since she has already failed PO antibiotics  (Clinda) and has possible abscess as well as septic arthritis of the joint, she may not respond to antibiotics alone and may require a wash-out/debridement -If this is the case, the patient will later require transfer to a tertiary facility. -Oxy IR and morphine as needed for pain control  WPW -Appears to be stable -Patient has not seen cardiology in some time and should be referred back as an outpatient   DVT prophylaxis:  Early ambulation Code Status:  Full - confirmed with patient/family Family Communication: Ex-husband present throughout evaluation  Disposition Plan:  Home once clinically improved Consults called: ENT, oral surgery by telephone only  Admission status: It is my clinical opinion that referral for OBSERVATION is reasonable and necessary in this patient based on the above information provided. The aforementioned taken together are felt to place the patient at high risk for further clinical deterioration. However it is anticipated that the patient may be medically stable for discharge from the hospital within 24 to 48 hours.    Karmen Bongo MD [facility]  If note is complete, please contact covering daytime or nighttime physician. [email or web address] Password [email or web address]  11/27/2016, 1:54 AM

## 2016-11-26 NOTE — ED Notes (Signed)
Patient transported to CT 

## 2016-11-26 NOTE — Care Management Note (Signed)
Case Management Note  Patient Details  Name: Amanda Davenport MRN: [id number] Date of Birth: [birth year]  Subjective/Objective:       Patient presented to [facility] ED with facial swelling             Action/Plan: Patient is noted to be without health insurance, patient has attended the [facility] in the past. CM met with patient who confirmed the information. Patient is agreeable to continue care at the [facility] once medically cleared for discharge.  Patient may need medication assistance CM will continue to follow for discharge needs.   Expected Discharge Date:                  Expected Discharge Plan:  Home/Self Care  In-House Referral:     Discharge planning Services  CM Consult, [place] [facility], [facility]  Post Acute Care Choice:    Choice offered to:  Patient  DME Arranged:    DME Agency:     HH Arranged:    [place] Agency:     Status of Service:  In process, will continue to follow  If discussed at Long Length of Stay Meetings, dates discussed:    Additional CommentsLaurena Slimmer, RN 11/26/2016, 11:00 PM

## 2016-11-26 NOTE — ED Notes (Signed)
ED Provider at bedside. 

## 2016-11-26 NOTE — ED Triage Notes (Signed)
Pt presents for evaluation of R sided facial swelling x 2 weeks. Pt previously seen and treated with abx for possible dental abscess. Pt reports compliance with medication but no improvement to swelling or pain. Pt reports pain extends to R ear now.

## 2016-11-26 NOTE — ED Provider Notes (Signed)
[facility] DEPT Provider Note   CSN: [id number] Arrival date & time: 11/26/16  1216     History   Chief Complaint Chief Complaint  Patient presents with  . Facial Swelling    HPI Amanda Davenport is a 36 y.o. female.  HPI    36 year old female presents today with complaints of facial swelling.  Patient was originally seen on 11/07/2016 with left sided facial pain.  Patient had minor irritation of her TM at that time.  She was placed on amoxicillin.  Patient notes she did not take this as she did not feel this was related to her teeth.  She notes she followed up again on 11 August with similar symptoms with facial swelling.  She was given a dose of IV clindamycin and discharged home on antibiotics.  Patient notes she has been taking antibiotics and recently finished them, she reports that she still having right-sided facial swelling, and subjective fevers at home.  Patient reports she is having difficulty opening her mouth, pain along the jaw and right ear.  She denies any history of the same.  She reports that her teeth do not hurt and does not feel this is a dental infection.    Past Medical History:  Diagnosis Date  . Allergy   . Anemia    receives transfusions periodically  . Arrhythmia   . Chronic headache   . Nearsightedness    wears glasses  . Obesity   . Recurrent boils   . WPW (Wolff-Parkinson-White syndrome)     Patient Active Problem List   Diagnosis Date Noted  . 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 Absolute anemia 05/21/2015  . Neck strain 12/11/2014  . Hydradenitis 12/11/2014  . Dental caries 12/11/2014  . Constipation 12/03/2014  . Neck pain 12/03/2014  . Myalgia and myositis 11/27/2014  . 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 Morbid obesity ([place]) 11/27/2014  . Hematochezia 12/16/2012  . Wolff-Parkinson-White (WPW) syndrome 03/01/2012  . Iron deficiency anemia 02/16/2012  . Tachycardia 01/28/2012  . Hidradenitis suppurativa 08/18/2011    . AXILLARY ABSCESS 12/30/2006  . ANKLE PAIN 10/11/2006    Past Surgical History:  Procedure Laterality Date  . TONSILLECTOMY      OB History    Gravida Para Term Preterm AB Living   0             SAB TAB Ectopic Multiple Live Births                   Home Medications    Prior to Admission medications   Medication Sig Start Date End Date Taking? Authorizing Provider  acetaminophen (TYLENOL) 650 MG CR tablet Take 1,300 mg by mouth 2 (two) times daily.    [provider]  clindamycin (CLEOCIN) 300 MG capsule Take 1 capsule (300 mg total) by mouth 3 (three) times daily. X 7 days 11/14/16   Drenda Freeze, MD  HYDROcodone-acetaminophen (NORCO/VICODIN) 5-325 MG tablet Take 1 tablet by mouth every 6 (six) hours as needed. 11/14/16   Drenda Freeze, MD  ibuprofen (ADVIL,MOTRIN) 800 MG tablet Take 1 tablet (800 mg total) by mouth 3 (three) times daily. 11/14/16   Drenda Freeze, MD  naproxen (NAPROSYN) 500 MG tablet Take 1 tablet (500 mg total) by mouth 2 (two) times daily with a meal. 11/07/16   Veryl Speak, MD  traMADol (ULTRAM) 50 MG tablet Take 1 tablet (50 mg total) by mouth every 6 (six) hours as needed. 11/07/16   Delo,  Nathaneil Canary, MD  valACYclovir (VALTREX) 1000 MG tablet Take 1 tablet (1,000 mg total) by mouth daily. 04/15/16   [name], FNP    Family History Family History  Problem Relation Age of Onset  . Breast cancer Mother   . Pulmonary embolism Mother        died of PE  . Colon cancer Mother   . Irritable bowel syndrome Mother   . Cancer Mother   . Diabetes Paternal Grandmother   . Heart disease Neg Hx   . Stroke Neg Hx     Social History Social History  Substance Use Topics  . Smoking status: Current Some Day Smoker    Years: 0.50    Types: Cigarettes  . Smokeless tobacco: Never Used     Comment: smokes black and milds - last use early-mid August  . Alcohol use No     Comment: occ     Allergies   Other; Penicillins;  Shellfish-derived products; Shrimp [shellfish allergy]; and Sulfa antibiotics   Review of Systems Review of Systems  All other systems reviewed and are negative.   Physical Exam Updated Vital Signs BP (!) 112/57 (BP Location: Right Arm)   Pulse 67   Temp 99.2 F (37.3 C) (Oral)   Resp 18   Ht 5\' 9"  (1.753 m)   Wt 108 kg (238 lb)   LMP 11/06/2016 (Exact Date)   SpO2 100%   BMI 35.15 kg/m   Physical Exam  Constitutional: She is oriented to person, place, and time. She appears well-developed and well-nourished.  HENT:  Head: Normocephalic and atraumatic.  Erythematous swollen right external auditory canal with minor erythema of the right TM, no significant pain with epilation of the ear-right sided facial swelling along the mandibular angle up to the TMJ  Numerous dental caries and missing teeth, gumline palpated nontender nonfluctuant difficult exam due to patient's trismus  Neck is supple full active range of motion nontender no significant lymphadenopathy  Eyes: Pupils are equal, round, and reactive to light. Conjunctivae are normal. Right eye exhibits no discharge. Left eye exhibits no discharge. No scleral icterus.  Neck: Normal range of motion. No JVD present. No tracheal deviation present.  Pulmonary/Chest: Effort normal. No stridor.  Neurological: She is alert and oriented to person, place, and time. Coordination normal.  Psychiatric: She has a normal mood and affect. Her behavior is normal. Judgment and thought content normal.  Nursing note and vitals reviewed.    ED Treatments / Results  Labs (all labs ordered are listed, but only abnormal results are displayed) Labs Reviewed  CBC WITH DIFFERENTIAL/PLATELET - Abnormal; Notable for the following:       Result Value   RBC 3.79 (*)    Hemoglobin 9.1 (*)    HCT 28.5 (*)    MCV 75.2 (*)    MCH 24.0 (*)    RDW 17.3 (*)    Platelets 568 (*)    All other components within normal limits  BASIC METABOLIC PANEL     EKG  EKG Interpretation None       Radiology Ct Maxillofacial W Contrast  Result Date: 11/26/2016 CLINICAL DATA:  RIGHT ear and facial pain, swelling for 2 weeks. Treated for abscess November 14, 2016. EXAM: CT MAXILLOFACIAL WITH CONTRAST TECHNIQUE: Multidetector CT imaging of the maxillofacial structures was performed with intravenous contrast. Multiplanar CT image reconstructions were also generated. CONTRAST:  54mL ISOVUE-300 IOPAMIDOL (ISOVUE-300) INJECTION 61% COMPARISON:  CT HEAD November 07, 2014 FINDINGS: OSSEOUS: The mandible  is intact, the condyles are located. Slight cortical irregularity RIGHT condyle and slight right temporomandibular joint effusion. No acute facial fracture. Tooth 4 and 5 periapical lucencies and dental caries. ORBITS: Ocular globes and orbital contents are normal. SINUSES: Mild paranasal sinus mucosal thickening. Soft tissue narrowing the RIGHT external auditory canal that erosions. Trace middle ear soft tissue in, RIGHT mastoid effusion. Nasal septum is midline. Included mastoid aircells are well aerated. SOFT TISSUES: RIGHT facial soft tissue swelling, subcutaneous fat stranding extending to RIGHT temporomandibular joint. Enlarged RIGHT pterygoid muscles with effacement of the fat planes. Effusion underlying the RIGHT masseter muscle without rim enhancement. LIMITED INTRACRANIAL: Normal. IMPRESSION: RIGHT facial cellulitis contiguous with RIGHT temporomandibular joint space. Slight possible erosions RIGHT mandible condyle associated with extensive inflammatory changes RIGHT masticator space concerning for septic arthritis versus inflammatory arthritis. No drainable fluid collection. RIGHT otitis externa, middle ear and mastoid effusion. Tooth 4 and 5 (RIGHT maxillary) dental caries and periapical abscess. Electronically Signed   By: Elon Alas M.D.   On: 11/26/2016 18:58    Procedures Procedures (including critical care time)  Medications Ordered in  ED Medications  ibuprofen (ADVIL,MOTRIN) tablet 800 mg (800 mg Oral Given 11/26/16 1633)  iopamidol (ISOVUE-300) 61 % injection (75 mLs  Contrast Given 11/26/16 1800)     Initial Impression / Assessment and Plan / ED Course  I have reviewed the triage vital signs and the nursing notes.  Pertinent labs & imaging results that were available during my care of the patient were reviewed by me and considered in my medical decision making (see chart for details).  Clinical Course as of Nov 27 2347  Thu Nov 26, 2016  [place] CT Maxillofacial W Contrast [CK]  4627 CT Maxillofacial W Contrast [CK]    Clinical Course User Index [CK] Salvadore Dom, Student-PA     Final Clinical Impressions(s) / ED Diagnoses   Final diagnoses:  Dental abscess  Facial cellulitis    36 year old female presents today with facial infection.  This is questionable for septic arthritis with bony involvement.  Patient is afebrile nontoxic.  She has no significant elevation in her WBCs.  Patient has failed outpatient clindamycin.  There is currently no oral surgeon on call, discuss case with ENT who recommended oral surgery consultation.  We currently do not have an oral surgeon on call.  I did touch base with Dr. Hoyt Koch who is on-call earlier in the day.  He is not immediately available tonight or tomorrow to provide any assistance in this patient's case.  [facility] oral surgery will be consulted.  Secretary reports that PALS line states there is no on-call oral surgeon at [facility]  An attempt will be made to touch base with neurosurgery at [facility] regional  I spoke with Dr. Hardie Shackleton at [facility].  Dr. Hardie Shackleton was adamant that he would not accept this patient in transfer and that this was a system issue with incontinent needed resolved within our system.  I expressed that I did not have any oral surgeon to assist in this case, he repeated that he would not accept this patient in transfer and  that this was the responsibility of the oral surgeons contracted with [facility]  An attempt will be made to contact [facility].  I spoke with [facility] oral surgeon Dr. Zigmund Daniel of oral surgery he was very helpful.  His recommendation was that this patient did not need immediate intervention and thus would unlikely need transfer at this time.  Antibiotic therapy would be reasonable approach at this time.  Hospitalist service consulted for admission.  I spoke with Dr. Lorin Mercy who would discuss the case with the patient and help coordinate disposition for the patient.   Patient care transfer to oncoming provider pending further management    New Prescriptions New Prescriptions   No medications on file     Francee Gentile 11/26/16 2349    Davonna Belling, MD 11/28/16 0002

## 2016-11-26 NOTE — ED Notes (Signed)
ED Provider at bedside. 

## 2016-11-26 NOTE — ED Provider Notes (Signed)
[place]    CSN: [id number] Arrival date & time: 11/26/16  1120   History   Chief Complaint Chief Complaint  Patient presents with  . Otalgia  . Facial Swelling    HPI Amanda Davenport is a 36 y.o. female.   HPI  Amanda Davenport is a 36 y.o. female presenting to UC with c/o persistent Right side facial swelling and pain with limited movement of her mouth for at least 2 weeks.  She was seen on 11/14/16, dx with possible abscess. Was given IV clindamycin as well as discharged home with antibiotics. She has completed the antibiotics but denies improvement. Reports low-grade fevers. She has only been able to eat soft diet as she cannot open her mouth.        Past Medical History:  Diagnosis Date  . Allergy   . Anemia   . Arrhythmia   . Chronic headache   . Nearsightedness    wears glasses  . Obesity   . Recurrent boils   . WPW (Wolff-Parkinson-White syndrome)     Patient Active Problem List   Diagnosis Date Noted  . 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 Absolute anemia 05/21/2015  . Neck strain 12/11/2014  . Hydradenitis 12/11/2014  . Dental caries 12/11/2014  . Constipation 12/03/2014  . Neck pain 12/03/2014  . Myalgia and myositis 11/27/2014  . 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 Morbid obesity ([place]) 11/27/2014  . Hematochezia 12/16/2012  . Wolff-Parkinson-White (WPW) syndrome 03/01/2012  . Iron deficiency anemia 02/16/2012  . Tachycardia 01/28/2012  . Hidradenitis suppurativa 08/18/2011  . AXILLARY ABSCESS 12/30/2006  . ANKLE PAIN 10/11/2006    Past Surgical History:  Procedure Laterality Date  . CYSTECTOMY     tonsils    OB History    Gravida Para Term Preterm AB Living   0             SAB TAB Ectopic Multiple Live Births                   Home Medications    Prior to Admission medications   Medication Sig Start Date End Date Taking? Authorizing Provider  ibuprofen (ADVIL,MOTRIN) 800 MG tablet Take 1 tablet (800 mg  total) by mouth 3 (three) times daily. 11/14/16  Yes Drenda Freeze, MD  naproxen (NAPROSYN) 500 MG tablet Take 1 tablet (500 mg total) by mouth 2 (two) times daily with a meal. 11/07/16  Yes Delo, Nathaneil Canary, MD  acetaminophen (TYLENOL) 650 MG CR tablet Take 1,300 mg by mouth 2 (two) times daily.    [provider]  clindamycin (CLEOCIN) 300 MG capsule Take 1 capsule (300 mg total) by mouth 3 (three) times daily. X 7 days 11/14/16   Drenda Freeze, MD  HYDROcodone-acetaminophen (NORCO/VICODIN) 5-325 MG tablet Take 1 tablet by mouth every 6 (six) hours as needed. 11/14/16   Drenda Freeze, MD  traMADol (ULTRAM) 50 MG tablet Take 1 tablet (50 mg total) by mouth every 6 (six) hours as needed. 11/07/16   Veryl Speak, MD  valACYclovir (VALTREX) 1000 MG tablet Take 1 tablet (1,000 mg total) by mouth daily. 04/15/16   [name], FNP    Family History Family History  Problem Relation Age of Onset  . Breast cancer Mother   . Pulmonary embolism Mother        died of PE  . Colon cancer Mother   . Irritable bowel syndrome Mother   . Cancer Mother   .  Diabetes Paternal Grandmother   . Heart disease Neg Hx   . Stroke Neg Hx     Social History Social History  Substance Use Topics  . Smoking status: Current Some Day Smoker    Years: 0.50    Types: Cigarettes  . Smokeless tobacco: Never Used     Comment: smokes black and milds  . Alcohol use No     Comment: occ     Allergies   Other; Penicillins; Shellfish-derived products; Shrimp [shellfish allergy]; and Sulfa antibiotics   Review of Systems Review of Systems  Constitutional: Positive for fever. Negative for chills.  HENT: Positive for dental problem, ear pain (Right) and facial swelling (Right side). Negative for ear discharge, sore throat, trouble swallowing and voice change.   Eyes: Negative for pain and visual disturbance.  Respiratory: Negative for cough and shortness of breath.   Gastrointestinal: Negative for  diarrhea, nausea and vomiting.  Neurological: Positive for headaches. Negative for dizziness and light-headedness.     Physical Exam Triage Vital Signs ED Triage Vitals  Enc Vitals Group     BP 11/26/16 1130 128/84     Pulse Rate 11/26/16 1130 96     Resp 11/26/16 1130 16     Temp 11/26/16 1130 99.1 F (37.3 C)     Temp Source 11/26/16 1130 Oral     SpO2 11/26/16 1130 100 %     Weight 11/26/16 1131 230 lb (104.3 kg)     Height 11/26/16 1131 5\' 9"  (1.753 m)     Head Circumference --      Peak Flow --      Pain Score 11/26/16 1132 8     Pain Loc --      Pain Edu? --      Excl. in [place]? --    No data found.   Updated Vital Signs BP 128/84 (BP Location: Right Arm)   Pulse 96   Temp 99.1 F (37.3 C) (Oral)   Resp 16   Ht 5\' 9"  (1.753 m)   Wt 230 lb (104.3 kg)   LMP 11/06/2016 (Exact Date)   SpO2 100%   BMI 33.97 kg/m   Physical Exam  Constitutional: She is oriented to person, place, and time. She appears well-developed and well-nourished. No distress.  HENT:  Head: Normocephalic and atraumatic.    Right Ear: Tympanic membrane normal. There is swelling (swelling of ear canal).  Left Ear: Tympanic membrane normal.  Mouth/Throat: Oropharynx is clear and moist and mucous membranes are normal. There is trismus in the jaw.  Moderate edema to Right side of face. Severe tenderness. Worse near TMJ. No erythema or warmth Unable to perform full oral exam given trismus from pain and swelling  Eyes: EOM are normal.  Neck: Normal range of motion.  Cardiovascular: Normal rate.   Pulmonary/Chest: Effort normal.  Musculoskeletal: Normal range of motion.  Lymphadenopathy:    She has cervical adenopathy.  Neurological: She is alert and oriented to person, place, and time.  Skin: Skin is warm and dry. She is not diaphoretic.  Psychiatric: She has a normal mood and affect. Her behavior is normal.  Nursing note and vitals reviewed.    UC Treatments / Results  Labs (all labs  ordered are listed, but only abnormal results are displayed) Labs Reviewed - No data to display  EKG  EKG Interpretation None       Radiology No results found.  Procedures Procedures (including critical care time)  Medications Ordered in UC  Medications - No data to display   Initial Impression / Assessment and Plan / UC Course  I have reviewed the triage vital signs and the nursing notes.  Pertinent labs & imaging results that were available during my care of the patient were reviewed by me and considered in my medical decision making (see chart for details).       Final Clinical Impressions(s) / UC Diagnoses   Final diagnoses:  Right facial swelling  Right ear pain  Pain in bone of jaw   Reviewed last few visits. Pt was given IV clindamhycin and discharged home with antibiotics on 11/14/16 but denies significant relief.  Pt has moderate edema to Right side of face. Trismus. Severe tenderness at Right TMJ and along mandible. Right ear canal: mild edema, secondary to surrounding edema?   Recommended pt go to emergency department today for further evaluation with imaging, likely CT scan to r/o underlying mass or persistent abscess. Pt agreeable with plan.   New Prescriptions Discharge Medication List as of 11/26/2016 12:05 PM       Controlled Substance Prescriptions [place] Controlled Substance Registry consulted? Not Applicable   Tyrell Antonio 11/26/16 1344

## 2016-11-27 DIAGNOSIS — K047 Periapical abscess without sinus: Secondary | ICD-10-CM | POA: Diagnosis present

## 2016-11-27 DIAGNOSIS — L03211 Cellulitis of face: Secondary | ICD-10-CM

## 2016-11-27 DIAGNOSIS — I456 Pre-excitation syndrome: Secondary | ICD-10-CM

## 2016-11-27 LAB — BASIC METABOLIC PANEL
Anion gap: 9 (ref 5–15)
BUN: 7 mg/dL (ref 6–20)
CHLORIDE: 103 mmol/L (ref 101–111)
CO2: 25 mmol/L (ref 22–32)
CREATININE: 0.65 mg/dL (ref 0.44–1.00)
Calcium: 8.7 mg/dL — ABNORMAL LOW (ref 8.9–10.3)
GFR calc non Af Amer: >60 mL/min (ref >60)
Glucose, Bld: 81 mg/dL (ref 65–99)
POTASSIUM: 3.6 mmol/L (ref 3.5–5.1)
SODIUM: 137 mmol/L (ref 135–145)

## 2016-11-27 LAB — CBC
HEMATOCRIT: 28.2 % — AB (ref 36.0–46.0)
Hemoglobin: 8.9 g/dL — ABNORMAL LOW (ref 12.0–15.0)
MCH: 24 pg — AB (ref 26.0–34.0)
MCHC: 31.6 g/dL (ref 30.0–36.0)
MCV: 76 fL — AB (ref 78.0–100.0)
PLATELETS: 548 10*3/uL — AB (ref 150–400)
RBC: 3.71 MIL/uL — AB (ref 3.87–5.11)
RDW: 17.7 % — ABNORMAL HIGH (ref 11.5–15.5)
WBC: 6.9 10*3/uL (ref 4.0–10.5)

## 2016-11-27 MED ORDER — MORPHINE SULFATE (PF) 4 MG/ML IV SOLN
2.0000 mg | INTRAVENOUS | Status: DC | PRN
  Administered 2016-11-27: 2 mg via INTRAVENOUS
  Filled 2016-11-27: qty 1

## 2016-11-27 MED ORDER — HYDROMORPHONE HCL 1 MG/ML IJ SOLN
1.0000 mg | Freq: Once | INTRAMUSCULAR | Status: AC
  Administered 2016-11-27: 1 mg via INTRAVENOUS
  Filled 2016-11-27: qty 1

## 2016-11-27 MED ORDER — ACETAMINOPHEN 650 MG RE SUPP: 650.0000 mg | Freq: Four times a day (QID) | RECTAL | Status: DC | PRN

## 2016-11-27 MED ORDER — MENTHOL 3 MG MT LOZG: 1.0000 | LOZENGE | OROMUCOSAL | Status: DC | PRN

## 2016-11-27 MED ORDER — IBUPROFEN 800 MG PO TABS
800.0000 mg | ORAL_TABLET | Freq: Four times a day (QID) | ORAL | Status: DC | PRN
  Administered 2016-11-28 – 2016-11-29 (×5): 800 mg via ORAL
  Filled 2016-11-27 (×5): qty 1

## 2016-11-27 MED ORDER — CIPROFLOXACIN IN D5W 400 MG/200ML IV SOLN
400.0000 mg | Freq: Once | INTRAVENOUS | Status: AC
  Administered 2016-11-27: 400 mg via INTRAVENOUS
  Filled 2016-11-27: qty 200

## 2016-11-27 MED ORDER — OXYCODONE HCL 5 MG PO TABS
5.0000 mg | ORAL_TABLET | ORAL | Status: DC | PRN
  Administered 2016-11-27: 5 mg via ORAL
  Filled 2016-11-27: qty 1

## 2016-11-27 MED ORDER — ONDANSETRON HCL 4 MG/2ML IJ SOLN: 4.0000 mg | Freq: Four times a day (QID) | INTRAMUSCULAR | Status: DC | PRN

## 2016-11-27 MED ORDER — VANCOMYCIN HCL 10 G IV SOLR
2000.0000 mg | Freq: Once | INTRAVENOUS | Status: AC
  Administered 2016-11-27: 2000 mg via INTRAVENOUS
  Filled 2016-11-27: qty 2000

## 2016-11-27 MED ORDER — METRONIDAZOLE IN NACL 5-0.79 MG/ML-% IV SOLN
500.0000 mg | Freq: Three times a day (TID) | INTRAVENOUS | Status: DC
  Administered 2016-11-27 – 2016-11-29 (×7): 500 mg via INTRAVENOUS
  Filled 2016-11-27 (×9): qty 100

## 2016-11-27 MED ORDER — VANCOMYCIN HCL IN DEXTROSE 1-5 GM/200ML-% IV SOLN
1000.0000 mg | Freq: Three times a day (TID) | INTRAVENOUS | Status: DC
  Administered 2016-11-27 – 2016-11-29 (×7): 1000 mg via INTRAVENOUS
  Filled 2016-11-27 (×10): qty 200

## 2016-11-27 MED ORDER — ACETAMINOPHEN 325 MG PO TABS: 650.0000 mg | ORAL_TABLET | Freq: Four times a day (QID) | ORAL | Status: DC | PRN

## 2016-11-27 MED ORDER — ONDANSETRON HCL 4 MG PO TABS
4.0000 mg | ORAL_TABLET | Freq: Four times a day (QID) | ORAL | Status: DC | PRN
Start: 2016-11-27 — End: 2016-11-29

## 2016-11-27 MED ORDER — METRONIDAZOLE IN NACL 5-0.79 MG/ML-% IV SOLN
500.0000 mg | Freq: Once | INTRAVENOUS | Status: AC
  Administered 2016-11-27: 500 mg via INTRAVENOUS
  Filled 2016-11-27: qty 100

## 2016-11-27 NOTE — Progress Notes (Signed)
[facility] PROGRESS NOTE  Amanda Davenport GLO:[id number] DOB: [birth year] DOA: 11/26/2016 PCP: [name], FNP  Interim summary and HPI 36 y.o. female with medical history significant of WPW (not regularly followed by cardiology) and anemia requiring periodic transfusions presenting with right facial pain/swelling.  "My mouth has been giving me problems for 2 weeks".  Came in on 8/4 and was diagnosed with a possible middle ear infection and given amoxicillin.  She didn't think she had an infection and so she didn't take it.  She came back and again on 8/11 and there was concern for dental infection vs. parotitis.  She was given Clindamycin IV x 1 and then PO and she reports having completed the course resolution of problems. No presented with worsening pain and inflammation. Since she was not accepted at tertiary facility, was brought in for IV antibiotics as temporizing measures.  Assessment/Plan: 1-mastoiditis/facial cellulitis/right maxillary periapical abscess: with concerns on CT for possible affectation of right mandible joint. -no oral surgery available today or for the next several days -explained to patient that treatment starts with proper drainage and cleaning of dental abscess, along with antibiotics, but that antibiotic along will not solve the problem. -unable to provide proper timely care with available resources. -family requested 24 hours of IV antibiotics. -will granted IV antibiotics time, after making them understand delay in proper treatment and essentially temporizing her condition. -will use ibuprofen and application of warm compresses for pain and inflammation control -continue supportive care. tertiary facility decline receiving patient in transfer, as she is not systemically impaired and according to them can follow up with oral surgeon as an outpatient.   2-hx of WPW -stable -will require to resume outpatient follow up with cardiology service   Code  Status: Full Family Communication: no family at bedside  Disposition Plan: patient and family beg to stay for 24 hours of antibiotics; explained in delayed of proper care, as even she needs IV abx's, most important aspect is to have oral surgery evaluating her and cleaning/draining oral abscess.   Consultants:  None   Procedures:  See below for x-ray reports   Antibiotics:  Vancomycin and flagyl 8/24  HPI/Subjective: Afebrile, no nausea, no vomiting. Continue complaining of swelling and pain in right side of her face.  Objective: Vitals:   11/27/16 0559 11/27/16 1300  BP: 114/65 111/64  Pulse: 67 91  Resp: 18 17  Temp: 98.1 F (36.7 C) 98.9 F (37.2 C)  SpO2: 100% 100%    Intake/Output Summary (Last 24 hours) at 11/27/16 1520 Last data filed at 11/27/16 0600  Gross per 24 hour  Intake                0 ml  Output                0 ml  Net                0 ml   Filed Weights   11/26/16 1232 11/27/16 0326  Weight: 108 kg (238 lb) 105.1 kg (231 lb 11.2 oz)    Exam:   General: complaining of pain and swelling on right side of her face. Also reported right ear discomfort and sore throat. Patient afebrile and non septic in appearance.  Cardiovascular: S1 and S2, no rubs or gallops  Respiratory: CTA bilaterally  Abdomen: soft, NT, ND, positive BS  Musculoskeletal: no edema, no cyanosis or clubbing   Data Reviewed: Basic Metabolic Panel:  Recent Labs Lab 11/26/16  1736 11/27/16 0515  NA 137 137  K 3.8 3.6  CL 106 103  CO2 25 25  GLUCOSE 92 81  BUN 8 7  CREATININE 0.62 0.65  CALCIUM 8.9 8.7*   CBC:  Recent Labs Lab 11/26/16 1736 11/27/16 0515  WBC 10.3 6.9  NEUTROABS 6.1  --   HGB 9.1* 8.9*  HCT 28.5* 28.2*  MCV 75.2* 76.0*  PLT 568* 548*   Studies: Ct Maxillofacial W Contrast  Result Date: 11/26/2016 CLINICAL DATA:  RIGHT ear and facial pain, swelling for 2 weeks. Treated for abscess November 14, 2016. EXAM: CT MAXILLOFACIAL WITH CONTRAST  TECHNIQUE: Multidetector CT imaging of the maxillofacial structures was performed with intravenous contrast. Multiplanar CT image reconstructions were also generated. CONTRAST:  41mL ISOVUE-300 IOPAMIDOL (ISOVUE-300) INJECTION 61% COMPARISON:  CT HEAD November 07, 2014 FINDINGS: OSSEOUS: The mandible is intact, the condyles are located. Slight cortical irregularity RIGHT condyle and slight right temporomandibular joint effusion. No acute facial fracture. Tooth 4 and 5 periapical lucencies and dental caries. ORBITS: Ocular globes and orbital contents are normal. SINUSES: Mild paranasal sinus mucosal thickening. Soft tissue narrowing the RIGHT external auditory canal that erosions. Trace middle ear soft tissue in, RIGHT mastoid effusion. Nasal septum is midline. Included mastoid aircells are well aerated. SOFT TISSUES: RIGHT facial soft tissue swelling, subcutaneous fat stranding extending to RIGHT temporomandibular joint. Enlarged RIGHT pterygoid muscles with effacement of the fat planes. Effusion underlying the RIGHT masseter muscle without rim enhancement. LIMITED INTRACRANIAL: Normal. IMPRESSION: RIGHT facial cellulitis contiguous with RIGHT temporomandibular joint space. Slight possible erosions RIGHT mandible condyle associated with extensive inflammatory changes RIGHT masticator space concerning for septic arthritis versus inflammatory arthritis. No drainable fluid collection. RIGHT otitis externa, middle ear and mastoid effusion. Tooth 4 and 5 (RIGHT maxillary) dental caries and periapical abscess. Electronically Signed   By: Elon Alas M.D.   On: 11/26/2016 18:58    Scheduled Meds: Continuous Infusions: . metronidazole Stopped (11/27/16 1040)  . vancomycin 1,000 mg (11/27/16 1345)    Principal Problem:   Dental abscess Active Problems:   Wolff-Parkinson-White (WPW) syndrome    Time spent: 25 minutes    Barton Dubois  [facility] Pager [phone]. If 7PM-7AM, please contact  night-coverage at [email or web address], password [facility] 11/27/2016, 3:20 PM  LOS: 0 days

## 2016-11-27 NOTE — Progress Notes (Addendum)
Pharmacy Antibiotic Note  Amanda Davenport is a 36 y.o. female admitted on 11/26/2016 with facial cellulitis/dental abscess. Pharmacy has been consulted for vancomycin dosing. Patient is afebrile and WBC within normal limits. SCr is 0.62 with estimated nCrCl >100 mL/min.   Patient is also receiving Flagyl per MD. Although cellulitis vancomycin trough goal is usually 10-15 mcg/mL, would aim for vancomycin trough close to 15 mcg/mL due to severity/location of infection.    Plan: Vancomycin 2g IV x1, then 1g IV q8hr  Vancomycin trough at steady state and as needed Monitor renal function, clinical picture, and culture data F/u length of therapy   Height: 5\' 9"  (175.3 cm) Weight: 231 lb 11.2 oz (105.1 kg) IBW/kg (Calculated) : 66.2  Temp (24hrs), Avg:98.8 F (37.1 C), Min:98.1 F (36.7 C), Max:99.2 F (37.3 C)   Recent Labs Lab 11/26/16 1736  WBC 10.3  CREATININE 0.62    Estimated Creatinine Clearance: 126.7 mL/min (by C-G formula based on SCr of 0.62 mg/dL).    Allergies  Allergen Reactions  . Other Other (See Comments)    All Antibiotics cause severe vaginal yeast infections  . Penicillins Hives, Itching and Swelling    Has patient had a PCN reaction causing immediate rash, facial/tongue/throat swelling, SOB or lightheadedness with hypotension: Yes Has patient had a PCN reaction causing severe rash involving mucus membranes or skin necrosis: Yes Has patient had a PCN reaction that required hospitalization Yes Has patient had a PCN reaction occurring within the last 10 years: Yes If all of the above answers are "NO", then may proceed with Cephalosporin use.   . Shellfish-Derived Products Hives  . Shrimp [Shellfish Allergy] Hives  . Sulfa Antibiotics Rash    Antimicrobials this admission: 8/24 Flagyl >> 8/24 Vanc >>  8/24 Cipro x1 in ED   Microbiology results: pending   Argie Ramming, PharmD Clinical Pharmacist 11/27/16 3:35 AM

## 2016-11-27 NOTE — Care Management Note (Addendum)
Case Management Note  Patient Details  Name: Amanda Davenport MRN: [id number] Date of Birth: 05/29/1980  Subjective/Objective:                    Action/Plan:  Provided list of oral surgeons and [place] letter to patient. Patient voiced understanding. Patient confirmed she is active with [facility] and schedule appointment.  Long discussion with patient and her father and nursing supervisor [name] at bedside.   Current plan is to discuss with MD if patient can have 24 hours of IV ABX to see if there is any improvement.  NCM will provide [place] letter , if patient is discharged on PO ABX , she can get prescription filled for $3 co pay.   Patient's father would like a list of Oral Surgeons in [place] .  [place] letter and Oral Surgeon provided. Expected Discharge Date:                  Expected Discharge Plan:  Home/Self Care  In-House Referral:     Discharge planning Services  CM Consult, [place] [facility], [facility]  Post Acute Care Choice:    Choice offered to:  Patient, Parent  DME Arranged:    DME Agency:     HH Arranged:    [place] Agency:     Status of Service:  In process, will continue to follow  If discussed at Long Length of Stay Meetings, dates discussed:    Additional Comments:  [name], RN 11/27/2016, 2:42 PM

## 2016-11-27 NOTE — ED Provider Notes (Signed)
Patient spoke with Dr. Lorin Mercy, and is now requesting to be transferred to [facility].  I spoke with Dr. Zigmund Daniel of Oral Surgery at [facility], who states that as there is no surgical emergency now, recommends IV antibiotics, and since that can be completed at our facility, is reluctant to accept patient in transfer.    I discussed the situation again with the patient, who at this point is very frustrated with the delays.  I have ordered her IV abx.  I have encouraged her to stay at [facility] for antibiotic therapy.  Appreciate Dr. Lorin Mercy for admitting the patient.   Montine Circle, PA-C 11/27/16 2229    Charlesetta Shanks, MD 12/01/16 [phone]

## 2016-11-27 NOTE — Progress Notes (Signed)
Pt upset that she feels as though she is being sent on a run around as she thinks that her tooth abscess requires an oral surgeon and she feels as though the hospital is not helping to facilitate that. Pt asking to speak to charge nurse about this issue

## 2016-11-28 DIAGNOSIS — H60391 Other infective otitis externa, right ear: Secondary | ICD-10-CM

## 2016-11-28 LAB — BASIC METABOLIC PANEL
ANION GAP: 9 (ref 5–15)
BUN: 7 mg/dL (ref 6–20)
CALCIUM: 8.7 mg/dL — AB (ref 8.9–10.3)
CO2: 23 mmol/L (ref 22–32)
CREATININE: 0.69 mg/dL (ref 0.44–1.00)
Chloride: 105 mmol/L (ref 101–111)
GLUCOSE: 81 mg/dL (ref 65–99)
Potassium: 4 mmol/L (ref 3.5–5.1)
Sodium: 137 mmol/L (ref 135–145)

## 2016-11-28 LAB — CBC
HCT: 28.7 % — ABNORMAL LOW (ref 36.0–46.0)
Hemoglobin: 9.2 g/dL — ABNORMAL LOW (ref 12.0–15.0)
MCH: 24.1 pg — ABNORMAL LOW (ref 26.0–34.0)
MCHC: 32.1 g/dL (ref 30.0–36.0)
MCV: 75.3 fL — ABNORMAL LOW (ref 78.0–100.0)
PLATELETS: 534 10*3/uL — AB (ref 150–400)
RBC: 3.81 MIL/uL — ABNORMAL LOW (ref 3.87–5.11)
RDW: 17.6 % — AB (ref 11.5–15.5)
WBC: 7.3 10*3/uL (ref 4.0–10.5)

## 2016-11-28 MED ORDER — CIPROFLOXACIN-DEXAMETHASONE 0.3-0.1 % OT SUSP
4.0000 [drp] | Freq: Two times a day (BID) | OTIC | Status: DC
  Administered 2016-11-28 – 2016-11-29 (×3): 4 [drp] via OTIC
  Filled 2016-11-28: qty 7.5

## 2016-11-28 NOTE — Progress Notes (Signed)
[facility] PROGRESS NOTE  Amanda Davenport QQI:[id number] DOB: [birth year] DOA: 11/26/2016 PCP: [name], FNP  Interim summary and HPI 36 y.o. female with medical history significant of WPW (not regularly followed by cardiology) and anemia requiring periodic transfusions presenting with right facial pain/swelling.  "My mouth has been giving me problems for 2 weeks".  Came in on 8/4 and was diagnosed with a possible middle ear infection and given amoxicillin.  She didn't think she had an infection and so she didn't take it.  She came back and again on 8/11 and there was concern for dental infection vs. parotitis.  She was given Clindamycin IV x 1 and then PO and she reports having completed the course resolution of problems. No presented with worsening pain and inflammation. Since she was not accepted at tertiary facility, was brought in for IV antibiotics as temporizing measures.  Assessment/Plan: 1-mastoiditis/facial cellulitis/right maxillary periapical abscess: with concerns on CT for possible affectation of right mandible joint. -no oral surgery available today or for the next several days -explained to patient that treatment starts with proper drainage and cleaning of dental abscess, along with antibiotics, but that antibiotic alone will not solve the problem. -unable to provide proper timely care with lack of resources. -patient has responded some to temporizing measures with abx's and given the fact here is no oral surgeons open today anyway, will give another 24 hours of antibiotics and discharge tomorrow. -continue ibuprofen and warm compresses  2-hx of WPW -stable -will require to resume outpatient follow up with cardiology service   3-Otitis externa/mastoiditis  -will use ciprodex BID  Code Status: Full Family Communication: no family at bedside  Disposition Plan: patient and family beg to stay for 24 hours of antibiotics; explained in delayed of proper care, as even  she needs IV abx's, most important aspect is to have oral surgery evaluating her and cleaning/draining oral abscess.   Consultants:  None   Procedures:  See below for x-ray reports   Antibiotics:  Vancomycin and flagyl 8/24  Ciprodex   HPI/Subjective: Afebrile, no CP, no SOB. Still with pain in right side of her face and ear; but reported decrease swelling.   Objective: Vitals:   11/28/16 0425 11/28/16 1336  BP: (!) 103/51 113/61  Pulse: 72 69  Resp: 16 16  Temp: 99 F (37.2 C) 98.4 F (36.9 C)  SpO2: 98% 100%    Intake/Output Summary (Last 24 hours) at 11/28/16 1645 Last data filed at 11/28/16 1412  Gross per 24 hour  Intake             1772 ml  Output                0 ml  Net             1772 ml   Filed Weights   11/26/16 1232 11/27/16 0326  Weight: 108 kg (238 lb) 105.1 kg (231 lb 11.2 oz)    Exam:   General: afebrile, reports improvement in swelling, still having pain. Continue complaining of right ear discomfort. No drainage or discharges.   Cardiovascular: RRR, no rubs, no gallops  Respiratory: good air movement bilaterally, no wheezing, no crackles  Abdomen: soft, NT, ND, positive BS  Musculoskeletal: no edema, no cyanosis, no clubbing    Data Reviewed: Basic Metabolic Panel:  Recent Labs Lab 11/26/16 1736 11/27/16 0515 11/28/16 0407  NA 137 137 137  K 3.8 3.6 4.0  CL 106 103 105  CO2 25  25 23  GLUCOSE 92 81 81  BUN 8 7 7   CREATININE 0.62 0.65 0.69  CALCIUM 8.9 8.7* 8.7*   CBC:  Recent Labs Lab 11/26/16 1736 11/27/16 0515 11/28/16 0407  WBC 10.3 6.9 7.3  NEUTROABS 6.1  --   --   HGB 9.1* 8.9* 9.2*  HCT 28.5* 28.2* 28.7*  MCV 75.2* 76.0* 75.3*  PLT 568* 548* 534*   Studies: Ct Maxillofacial W Contrast  Result Date: 11/26/2016 CLINICAL DATA:  RIGHT ear and facial pain, swelling for 2 weeks. Treated for abscess November 14, 2016. EXAM: CT MAXILLOFACIAL WITH CONTRAST TECHNIQUE: Multidetector CT imaging of the maxillofacial  structures was performed with intravenous contrast. Multiplanar CT image reconstructions were also generated. CONTRAST:  86mL ISOVUE-300 IOPAMIDOL (ISOVUE-300) INJECTION 61% COMPARISON:  CT HEAD November 07, 2014 FINDINGS: OSSEOUS: The mandible is intact, the condyles are located. Slight cortical irregularity RIGHT condyle and slight right temporomandibular joint effusion. No acute facial fracture. Tooth 4 and 5 periapical lucencies and dental caries. ORBITS: Ocular globes and orbital contents are normal. SINUSES: Mild paranasal sinus mucosal thickening. Soft tissue narrowing the RIGHT external auditory canal that erosions. Trace middle ear soft tissue in, RIGHT mastoid effusion. Nasal septum is midline. Included mastoid aircells are well aerated. SOFT TISSUES: RIGHT facial soft tissue swelling, subcutaneous fat stranding extending to RIGHT temporomandibular joint. Enlarged RIGHT pterygoid muscles with effacement of the fat planes. Effusion underlying the RIGHT masseter muscle without rim enhancement. LIMITED INTRACRANIAL: Normal. IMPRESSION: RIGHT facial cellulitis contiguous with RIGHT temporomandibular joint space. Slight possible erosions RIGHT mandible condyle associated with extensive inflammatory changes RIGHT masticator space concerning for septic arthritis versus inflammatory arthritis. No drainable fluid collection. RIGHT otitis externa, middle ear and mastoid effusion. Tooth 4 and 5 (RIGHT maxillary) dental caries and periapical abscess. Electronically Signed   By: Elon Alas M.D.   On: 11/26/2016 18:58    Scheduled Meds: . ciprofloxacin-dexamethasone  4 drop Right EAR BID   Continuous Infusions: . metronidazole Stopped (11/28/16 1045)  . vancomycin Stopped (11/28/16 1534)    Principal Problem:   Dental abscess Active Problems:   Wolff-Parkinson-White (WPW) syndrome    Time spent: 25 minutes    Barton Dubois  [facility] Pager [phone]. If 7PM-7AM, please contact  night-coverage at [email or web address], password [facility] 11/28/2016, 4:45 PM  LOS: 1 day

## 2016-11-29 DIAGNOSIS — L03211 Cellulitis of face: Secondary | ICD-10-CM

## 2016-11-29 DIAGNOSIS — H6090 Unspecified otitis externa, unspecified ear: Secondary | ICD-10-CM

## 2016-11-29 MED ORDER — CLINDAMYCIN HCL 300 MG PO CAPS: 300.0000 mg | ORAL_CAPSULE | Freq: Three times a day (TID) | ORAL | 0 refills | Status: AC

## 2016-11-29 MED ORDER — CIPROFLOXACIN-DEXAMETHASONE 0.3-0.1 % OT SUSP: 4.0000 [drp] | Freq: Two times a day (BID) | OTIC | 0 refills | Status: AC

## 2016-11-29 MED ORDER — METRONIDAZOLE 500 MG PO TABS: 500.0000 mg | ORAL_TABLET | Freq: Three times a day (TID) | ORAL | 0 refills | Status: AC

## 2016-11-29 MED ORDER — ACETAMINOPHEN 325 MG PO TABS: 650.0000 mg | ORAL_TABLET | Freq: Four times a day (QID) | ORAL | 0 refills | Status: DC | PRN

## 2016-11-29 MED ORDER — IBUPROFEN 800 MG PO TABS: 800.0000 mg | ORAL_TABLET | Freq: Four times a day (QID) | ORAL | 0 refills | Status: DC | PRN

## 2016-11-29 NOTE — Progress Notes (Signed)
Swelling to right face has decreased, pt feeling a lot better. Discharge instructions given to pt. Discharged to home.

## 2016-11-29 NOTE — Discharge Summary (Signed)
Physician Discharge Summary  Amanda Davenport NAT:[id number] DOB: September 16, 1980 DOA: 11/26/2016  PCP: Amanda Dew, FNP  Admit date: 11/26/2016 Discharge date: 11/29/2016  Time spent: 35 minutes  Recommendations for Outpatient Follow-up:  1. Will need to arrange follow up with cardiology service for further follow up of WPW 2. Assure patient has follow up with oral surgeon and has resolved her dental problem.   Discharge Diagnoses:  Principal Problem:   Dental abscess Active Problems:   Wolff-Parkinson-White (WPW) syndrome   Facial cellulitis   Otitis externa   Discharge Condition: stable and improved. Discharge home with instruction to follow up with oral surgery in the next day or so. CM provided match letter to assure patient will get prescribed medications at discharge.  Diet recommendation: soft diet   Filed Weights   11/26/16 1232 11/27/16 0326  Weight: 108 kg (238 lb) 105.1 kg (231 lb 11.2 oz)    History of present illness:  36 y.o.femalewith medical history significant of WPW (not regularly followed by cardiology) and anemia requiring periodic transfusions presenting with right facial pain/swelling.  "My mouth has been giving me problems for 2 weeks". Came in on 8/4 and was diagnosed with a possible middle ear infection and given amoxicillin. She didn't think she had an infection and so she didn't take it. She came back and again on 8/11 and there was concern for dental infection vs. parotitis. She was given Clindamycin IV x 1 and then PO and she reports having completed the course resolution of problems. No presented with worsening pain and inflammation. Since she was not accepted at tertiary facility, was brought in for IV antibiotics as temporizing measures.  Hospital Course:  1-mastoiditis/facial cellulitis/right maxillary periapical abscess: with concerns on CT for possible affectation of right mandible joint. -no oral surgery available for inpatient  setting. -explained to patient that treatment starts with proper drainage and cleaning of dental abscess, along with antibiotics, but that antibiotic alone will not solve the problem. -patient has responded well to temporizing measures with abx's. Will now discharge on flagyl and cleocin and she will follow up with oral surgery as an outpatient.  -continue ibuprofen and warm compresses -unable to provide any further proper/timely care with lack of resources.  2-hx of WPW -stable -will require to resume outpatient follow up with cardiology service   3-Otitis externa/mastoiditis  -will continue ciprodex BID  Procedures:  See below for x-ray reports   Consultations:  None   Discharge Exam: Vitals:   11/29/16 0449 11/29/16 1300  BP: (!) 117/54 102/61  Pulse: 74 (!) 111  Resp: 17 18  Temp: 99.2 F (37.3 C) 98.9 F (37.2 C)  SpO2: 100% 99%    General: afebrile, reports improvement in swelling, still having pain and complaining of right ear discomfort. No drainage or discharges (mouth, gum, ears).   Cardiovascular: RRR, no rubs, no gallops  Respiratory: good air movement bilaterally, no wheezing, no crackles  Abdomen: soft, NT, ND, positive BS  Musculoskeletal: no edema, no cyanosis, no clubbing    Discharge Instructions   Discharge Instructions    Discharge instructions    Complete by:  As directed    Keep yourself well hydrated Take antibiotics with food Follow up with PCP in 10 days Please follow in the next day or so with oral surgeon of your preference (for definitive treatment of ongoing peri-apical abscess.     Current Discharge Medication List    START taking these medications   Details  acetaminophen (TYLENOL)  325 MG tablet Take 2 tablets (650 mg total) by mouth every 6 (six) hours as needed for mild pain or headache (or Fever >/= 101). Qty: 40 tablet, Refills: 0    ciprofloxacin-dexamethasone (CIPRODEX) OTIC suspension Place 4 drops into the right  ear 2 (two) times daily. Qty: 4 mL, Refills: 0    clindamycin (CLEOCIN) 300 MG capsule Take 1 capsule (300 mg total) by mouth 3 (three) times daily. Qty: 30 capsule, Refills: 0    ibuprofen (ADVIL,MOTRIN) 800 MG tablet Take 1 tablet (800 mg total) by mouth every 6 (six) hours as needed for fever or headache (pain). Qty: 40 tablet, Refills: 0    metroNIDAZOLE (FLAGYL) 500 MG tablet Take 1 tablet (500 mg total) by mouth 3 (three) times daily. Qty: 30 tablet, Refills: 0       Allergies  Allergen Reactions  . Other Other (See Comments)    All Antibiotics cause severe vaginal yeast infections  . Penicillins Hives, Itching and Swelling    Has patient had a PCN reaction causing immediate rash, facial/tongue/throat swelling, SOB or lightheadedness with hypotension: Yes Has patient had a PCN reaction causing severe rash involving mucus membranes or skin necrosis: Yes Has patient had a PCN reaction that required hospitalization Yes Has patient had a PCN reaction occurring within the last 10 years: Yes If all of the above answers are "NO", then may proceed with Cephalosporin use.   . Shellfish-Derived Products Hives  . Shrimp [Shellfish Allergy] Hives  . Sulfa Antibiotics Rash   Follow-up Information    [facility]. Schedule an appointment as soon as possible for a visit.   Contact information: [place] [phone]          The results of significant diagnostics from this hospitalization (including imaging, microbiology, ancillary and laboratory) are listed below for reference.    Significant Diagnostic Studies: Dg Chest 2 View  Result Date: 11/14/2016 CLINICAL DATA:  Tachycardia.  Recently treated for cheek abscess. EXAM: CHEST  2 VIEW COMPARISON:  08/16/2013 FINDINGS: Lungs are adequately inflated and otherwise clear. Mild stable cardiomegaly. Remainder the exam is unchanged. IMPRESSION: No active cardiopulmonary disease. Mild  stable cardiomegaly. Electronically Signed   By: Marin Olp M.D.   On: 11/14/2016 12:09   Ct Maxillofacial W Contrast  Result Date: 11/26/2016 CLINICAL DATA:  RIGHT ear and facial pain, swelling for 2 weeks. Treated for abscess November 14, 2016. EXAM: CT MAXILLOFACIAL WITH CONTRAST TECHNIQUE: Multidetector CT imaging of the maxillofacial structures was performed with intravenous contrast. Multiplanar CT image reconstructions were also generated. CONTRAST:  33mL ISOVUE-300 IOPAMIDOL (ISOVUE-300) INJECTION 61% COMPARISON:  CT HEAD November 07, 2014 FINDINGS: OSSEOUS: The mandible is intact, the condyles are located. Slight cortical irregularity RIGHT condyle and slight right temporomandibular joint effusion. No acute facial fracture. Tooth 4 and 5 periapical lucencies and dental caries. ORBITS: Ocular globes and orbital contents are normal. SINUSES: Mild paranasal sinus mucosal thickening. Soft tissue narrowing the RIGHT external auditory canal that erosions. Trace middle ear soft tissue in, RIGHT mastoid effusion. Nasal septum is midline. Included mastoid aircells are well aerated. SOFT TISSUES: RIGHT facial soft tissue swelling, subcutaneous fat stranding extending to RIGHT temporomandibular joint. Enlarged RIGHT pterygoid muscles with effacement of the fat planes. Effusion underlying the RIGHT masseter muscle without rim enhancement. LIMITED INTRACRANIAL: Normal. IMPRESSION: RIGHT facial cellulitis contiguous with RIGHT temporomandibular joint space. Slight possible erosions RIGHT mandible condyle associated with extensive inflammatory changes RIGHT masticator space concerning  for septic arthritis versus inflammatory arthritis. No drainable fluid collection. RIGHT otitis externa, middle ear and mastoid effusion. Tooth 4 and 5 (RIGHT maxillary) dental caries and periapical abscess. Electronically Signed   By: Elon Alas M.D.   On: 11/26/2016 18:58   Labs: Basic Metabolic Panel:  Recent Labs Lab  11/26/16 1736 11/27/16 0515 11/28/16 0407  NA 137 137 137  K 3.8 3.6 4.0  CL 106 103 105  CO2 25 25 23   GLUCOSE 92 81 81  BUN 8 7 7   CREATININE 0.62 0.65 0.69  CALCIUM 8.9 8.7* 8.7*   CBC:  Recent Labs Lab 11/26/16 1736 11/27/16 0515 11/28/16 0407  WBC 10.3 6.9 7.3  NEUTROABS 6.1  --   --   HGB 9.1* 8.9* 9.2*  HCT 28.5* 28.2* 28.7*  MCV 75.2* 76.0* 75.3*  PLT 568* 548* 534*     Signed:  Barton Dubois MD.  [facility] 11/29/2016, 3:38 PM

## 2017-03-10 ENCOUNTER — Other Ambulatory Visit: Payer: Self-pay

## 2017-03-10 ENCOUNTER — Emergency Department (HOSPITAL_COMMUNITY)
Admission: EM | Admit: 2017-03-10 | Discharge: 2017-03-10 | Disposition: A | Discharge status: Home / Self Care | Payer: Self-pay | Attending: Emergency Medicine | Admitting: Emergency Medicine

## 2017-03-10 ENCOUNTER — Encounter (HOSPITAL_COMMUNITY): Payer: Self-pay | Admitting: Emergency Medicine

## 2017-03-10 ENCOUNTER — Emergency Department (HOSPITAL_COMMUNITY): Payer: Self-pay

## 2017-03-10 DIAGNOSIS — Z880 Allergy status to penicillin: Secondary | ICD-10-CM | POA: Insufficient documentation

## 2017-03-10 DIAGNOSIS — M25562 Pain in left knee: Secondary | ICD-10-CM | POA: Insufficient documentation

## 2017-03-10 DIAGNOSIS — Z91013 Allergy to seafood: Secondary | ICD-10-CM | POA: Insufficient documentation

## 2017-03-10 DIAGNOSIS — M25462 Effusion, left knee: Secondary | ICD-10-CM | POA: Insufficient documentation

## 2017-03-10 DIAGNOSIS — R52 Pain, unspecified: Secondary | ICD-10-CM

## 2017-03-10 DIAGNOSIS — F17210 Nicotine dependence, cigarettes, uncomplicated: Secondary | ICD-10-CM | POA: Insufficient documentation

## 2017-03-10 MED ORDER — KETOROLAC TROMETHAMINE 30 MG/ML IJ SOLN
30.0000 mg | Freq: Once | INTRAMUSCULAR | Status: AC
  Administered 2017-03-10: 30 mg via INTRAMUSCULAR
  Filled 2017-03-10: qty 1

## 2017-03-10 NOTE — Discharge Instructions (Signed)
Use knee brace to help support the knee, keep knee elevated and use ice and heat as much as possible, continue to use ibuprofen or Tylenol, you may also try Salonpas patches. Please call to schedule an appointment with Dr. Marlou Sa with orthopedics. If you experience redness, warmth, worsening swelling, are not able to move the knee at all, or develop fevers please return to the ED for sooner evaluation

## 2017-03-10 NOTE — Progress Notes (Signed)
Orthopedic Tech Progress Note Patient Details:  Amanda Davenport [birth year] [id number]  Ortho Devices Type of Ortho Device: Knee Immobilizer Ortho Device/Splint Location: LLE Ortho Device/Splint Interventions: Ordered, Application   Post Interventions Patient Tolerated: Well Instructions Provided: Care of device   Braulio Bosch 03/10/2017, 6:29 PM

## 2017-03-10 NOTE — ED Triage Notes (Signed)
Pt to ER for evaluation of right knee pain without injury. States started hurting 1 week ago. States swelling and inability to bend knee due to swelling. A/o x4. NAD.

## 2017-03-10 NOTE — ED Provider Notes (Signed)
[place] [facility] EMERGENCY DEPARTMENT Provider Note   CSN: [id number] Arrival date & time: 03/10/17  1541     History   Chief Complaint Chief Complaint  Patient presents with  . Knee Pain    HPI  SMT LOKEY is a 36 y.o. Female history of hidradenitis supra-teeth, headaches, WPW and obesity, who presents complaining of left knee pain for a little more than a week. Patient denies any inciting injury to the knee, though she does report that she recently was dealing with plantar fasciitis in her right foot causing her to walk differently. Patient reports she's had a small amount of swelling to the left knee and pain with range of motion, but she still been trying to go to work on her feet and has been able to walk, though she reports she's been walking with a limp. Patient denies any redness or warmth to the knee and denies any fevers or chills. Patient denies any swelling distal to the knee. Patient reports she's been trying to elevate the knee, taking ibuprofen and Tylenol, and wearing a knee brace, reports pain has persisted, patient also reports pain at night. Patient denies any pain in the ankle or hip, denies any numbness or tingling. No previous knee injury or surgeries.       Past Medical History:  Diagnosis Date  . Allergy   . Anemia    receives transfusions periodically  . Arrhythmia   . Chronic headache   . Nearsightedness    wears glasses  . Obesity   . Recurrent boils   . WPW (Wolff-Parkinson-White syndrome)     Patient Active Problem List   Diagnosis Date Noted  . Facial cellulitis   . Otitis externa   . Dental abscess 11/27/2016  . 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 Absolute anemia 05/21/2015  . Neck strain 12/11/2014  . Hydradenitis 12/11/2014  . Dental caries 12/11/2014  . Constipation 12/03/2014  . Neck pain 12/03/2014  . Myalgia and myositis 11/27/2014  . 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 Morbid obesity ([place])  11/27/2014  . Hematochezia 12/16/2012  . Wolff-Parkinson-White (WPW) syndrome 03/01/2012  . Iron deficiency anemia 02/16/2012  . Tachycardia 01/28/2012  . Hidradenitis suppurativa 08/18/2011  . AXILLARY ABSCESS 12/30/2006  . ANKLE PAIN 10/11/2006    Past Surgical History:  Procedure Laterality Date  . TONSILLECTOMY      OB History    Gravida Para Term Preterm AB Living   0             SAB TAB Ectopic Multiple Live Births                   Home Medications    Prior to Admission medications   Medication Sig Start Date End Date Taking? Authorizing Provider  acetaminophen (TYLENOL) 325 MG tablet Take 2 tablets (650 mg total) by mouth every 6 (six) hours as needed for mild pain or headache (or Fever >/= 101). 11/29/16   Barton Dubois, MD  ibuprofen (ADVIL,MOTRIN) 800 MG tablet Take 1 tablet (800 mg total) by mouth every 6 (six) hours as needed for fever or headache (pain). 11/29/16   Barton Dubois, MD    Family History Family History  Problem Relation Age of Onset  . Breast cancer Mother   . Pulmonary embolism Mother        died of PE  . Colon cancer Mother   . Irritable bowel syndrome Mother   . Cancer Mother   . Diabetes  Paternal Grandmother   . Heart disease Neg Hx   . Stroke Neg Hx     Social History Social History   Tobacco Use  . Smoking status: Current Some Day Smoker    Years: 0.50    Types: Cigarettes  . Smokeless tobacco: Never Used  . Tobacco comment: smokes black and milds - last use early-mid August  Substance Use Topics  . Alcohol use: No    Comment: occ  . Drug use: Yes    Types: Marijuana    Comment: 2+ times per month     Allergies   Other; Penicillins; Shellfish-derived products; Shrimp [shellfish allergy]; and Sulfa antibiotics   Review of Systems Review of Systems  Constitutional: Negative for chills and fever.  Musculoskeletal: Positive for arthralgias (L knee) and joint swelling.  Skin: Negative for color change, rash and wound.   Neurological: Negative for weakness and numbness.     Physical Exam Updated Vital Signs BP 120/77 (BP Location: Left Arm)   Pulse 86   Temp 98.4 F (36.9 C) (Oral)   Resp 18   LMP 03/10/2017 (Exact Date)   SpO2 100%   Physical Exam  Constitutional: She appears well-developed and well-nourished. No distress.  HENT:  Head: Normocephalic and atraumatic.  Eyes: Right eye exhibits no discharge. Left eye exhibits no discharge.  Pulmonary/Chest: Effort normal. No respiratory distress.  Musculoskeletal:  Right knee mildly tender to palpation, no erythema, warmth, ecchymosis or obvious deformity, small amount of swelling, patient able to flex and extend his knee somewhat though range of motion is limited by pain, no tenderness or pain with range of motion of a sore hip, no calf tenderness or swelling, DP and TP pulses 2+ and good capillary refill, sensation intact  Neurological: She is alert. Coordination normal.  Skin: Skin is warm and dry. Capillary refill takes less than 2 seconds. She is not diaphoretic. No erythema.  Psychiatric: She has a normal mood and affect. Her behavior is normal.  Nursing note and vitals reviewed.    ED Treatments / Results  Labs (all labs ordered are listed, but only abnormal results are displayed) Labs Reviewed - No data to display  EKG  EKG Interpretation None       Radiology Dg Knee Complete 4 Views Left  Result Date: 03/10/2017 CLINICAL DATA:  Left knee pain for 1 week. No known injury. Initial encounter. EXAM: LEFT KNEE - COMPLETE 4+ VIEW COMPARISON:  None. FINDINGS: No evidence of fracture or dislocation. A small joint effusion is seen. No evidence of arthropathy or other focal bone abnormality. Soft tissues are unremarkable. IMPRESSION: Small joint effusion.  Otherwise negative. Electronically Signed   By: Inge Rise M.D.   On: 03/10/2017 16:09    Procedures Procedures (including critical care time)  Medications Ordered in  ED Medications  ketorolac (TORADOL) 30 MG/ML injection 30 mg (30 mg Intramuscular Given 03/10/17 1723)     Initial Impression / Assessment and Plan / ED Course  I have reviewed the triage vital signs and the nursing notes.  Pertinent labs & imaging results that were available during my care of the patient were reviewed by me and considered in my medical decision making (see chart for details).  Patient presents with left knee pain with mild swelling for the past week. No redness or warmth, no fevers, range of motion is limited by pain, the patient is able to fully extend his knee, and flex to approximately 60, septic joint is unlikely. Mild tenderness to  palpation. Left leg is neurovascularly intact. Patient is able to bear weight with some discomfort. X-ray shows small joint effusion, but no fractures or dislocation. Pain treated in the ED with Toradol, with some improvement in range of motion. Patient to follow-up with orthopedics, provided knee immobilizer here in the ED. Stable for discharge home with continued treatment with NSAIDs, ice and elevation, also discussed using salonpas patches Patient offered crutches but declined reports she has a history of hidradenitis suppuritiva thinks that this would irritate this. Discussed strict return precautions including fevers, erythema or warmth of the joint, worsening swelling or inability to move the knee. Patient expresses understanding and is in agreement with plan.  Final Clinical Impressions(s) / ED Diagnoses   Final diagnoses:  Acute pain of left knee  Effusion of left knee    ED Discharge Orders    None       Janet Berlin 03/10/17 Earleen Newport, MD 03/11/17 1306

## 2017-03-10 NOTE — ED Notes (Signed)
Talked to ortho and he is on his way down.

## 2017-03-16 ENCOUNTER — Encounter (HOSPITAL_COMMUNITY): Payer: Self-pay | Admitting: Emergency Medicine

## 2017-03-16 ENCOUNTER — Other Ambulatory Visit: Payer: Self-pay

## 2017-03-16 ENCOUNTER — Emergency Department (HOSPITAL_COMMUNITY)
Admission: EM | Admit: 2017-03-16 | Discharge: 2017-03-16 | Disposition: A | Discharge status: Home / Self Care | Payer: Self-pay | Attending: Emergency Medicine | Admitting: Emergency Medicine

## 2017-03-16 DIAGNOSIS — F17210 Nicotine dependence, cigarettes, uncomplicated: Secondary | ICD-10-CM | POA: Insufficient documentation

## 2017-03-16 DIAGNOSIS — M79672 Pain in left foot: Secondary | ICD-10-CM | POA: Insufficient documentation

## 2017-03-16 DIAGNOSIS — M25562 Pain in left knee: Secondary | ICD-10-CM | POA: Insufficient documentation

## 2017-03-16 MED ORDER — TRAMADOL HCL 50 MG PO TABS: 50.0000 mg | ORAL_TABLET | Freq: Four times a day (QID) | ORAL | 0 refills | Status: DC | PRN

## 2017-03-16 MED ORDER — HYDROCODONE-ACETAMINOPHEN 5-325 MG PO TABS
1.0000 | ORAL_TABLET | Freq: Once | ORAL | Status: AC
  Administered 2017-03-16: 1 via ORAL
  Filled 2017-03-16: qty 1

## 2017-03-16 NOTE — ED Provider Notes (Signed)
[facility] DEPT Provider Note   CSN: [id number] Arrival date & time: 03/16/17  1445     History   Chief Complaint Chief Complaint  Patient presents with  . Knee Pain  . Foot Pain    HPI Amanda Davenport is a 36 y.o. female who presents with left knee pain and left foot pain.  No significant past medical history.  The patient states that she has had left knee pain for the past 3 weeks.  She was seen last week at [facility] and had an x-ray which showed a small joint effusion.  She was advised to rest, elevate, ice, take NSAIDs.  She says she has been doing all these things however the pain is been worsening and now she has pain in her left lateral foot as well.  This pain started 2 days ago.  Her knee pain and her foot pain are atraumatic.  She denies any redness.  She also notes that she was referred to orthopedics however she is unable to afford this because she does not have insurance.  She works in [employer] and is on her feet for long periods of time. She has been wearing a brace and compression stockings.  HPI  Past Medical History:  Diagnosis Date  . Allergy   . Anemia    receives transfusions periodically  . Arrhythmia   . Chronic headache   . Nearsightedness    wears glasses  . Obesity   . Recurrent boils   . WPW (Wolff-Parkinson-White syndrome)     Patient Active Problem List   Diagnosis Date Noted  . Facial cellulitis   . Otitis externa   . Dental abscess 11/27/2016  . 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 Absolute anemia 05/21/2015  . Neck strain 12/11/2014  . Hydradenitis 12/11/2014  . Dental caries 12/11/2014  . Constipation 12/03/2014  . Neck pain 12/03/2014  . Myalgia and myositis 11/27/2014  . 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 Morbid obesity ([place]) 11/27/2014  . Hematochezia 12/16/2012  . Wolff-Parkinson-White (WPW) syndrome 03/01/2012  . Iron deficiency anemia 02/16/2012  . Tachycardia 01/28/2012  .  Hidradenitis suppurativa 08/18/2011  . AXILLARY ABSCESS 12/30/2006  . ANKLE PAIN 10/11/2006    Past Surgical History:  Procedure Laterality Date  . TONSILLECTOMY      OB History    Gravida Para Term Preterm AB Living   0             SAB TAB Ectopic Multiple Live Births                   Home Medications    Prior to Admission medications   Medication Sig Start Date End Date Taking? Authorizing Provider  acetaminophen (TYLENOL) 325 MG tablet Take 2 tablets (650 mg total) by mouth every 6 (six) hours as needed for mild pain or headache (or Fever >/= 101). 11/29/16   Barton Dubois, MD  ibuprofen (ADVIL,MOTRIN) 800 MG tablet Take 1 tablet (800 mg total) by mouth every 6 (six) hours as needed for fever or headache (pain). 11/29/16   Barton Dubois, MD  traMADol (ULTRAM) 50 MG tablet Take 1 tablet (50 mg total) by mouth every 6 (six) hours as needed. 03/16/17   Recardo Evangelist, PA-C    Family History Family History  Problem Relation Age of Onset  . Breast cancer Mother   . Pulmonary embolism Mother        died of PE  . Colon cancer  Mother   . Irritable bowel syndrome Mother   . Cancer Mother   . Diabetes Paternal Grandmother   . Heart disease Neg Hx   . Stroke Neg Hx     Social History Social History   Tobacco Use  . Smoking status: Current Some Day Smoker    Years: 0.50    Types: Cigarettes  . Smokeless tobacco: Never Used  . Tobacco comment: smokes black and milds - last use early-mid August  Substance Use Topics  . Alcohol use: No    Comment: occ  . Drug use: Yes    Types: Marijuana    Comment: 2+ times per month     Allergies   Other; Penicillins; Shellfish-derived products; Shrimp [shellfish allergy]; and Sulfa antibiotics   Review of Systems Review of Systems  Musculoskeletal: Positive for arthralgias, gait problem and joint swelling.  Skin: Negative for color change and wound.  Neurological: Negative for weakness and numbness.     Physical  Exam Updated Vital Signs BP 120/78 (BP Location: Right Arm)   Pulse 88   Temp 98.6 F (37 C) (Oral)   Resp 20   Ht 5\' 9"  (1.753 m)   Wt 106.6 kg (235 lb)   LMP 03/10/2017 (Exact Date)   SpO2 100%   BMI 34.70 kg/m   Physical Exam  Constitutional: She is oriented to person, place, and time. She appears well-developed and well-nourished. No distress.  HENT:  Head: Normocephalic and atraumatic.  Eyes: Conjunctivae are normal. Pupils are equal, round, and reactive to light. Right eye exhibits no discharge. Left eye exhibits no discharge. No scleral icterus.  Neck: Normal range of motion.  Cardiovascular: Normal rate.  Pulmonary/Chest: Effort normal. No respiratory distress.  Abdominal: She exhibits no distension.  Musculoskeletal:  Left knee: Wearing a knee brace with a compression stocking underneath. No obvious swelling, deformity, or warmth. Tenderness to palpation of lateral joint line. No patella tenderness. Tendons are intact. She has decreased ROM due to pain. N/V intact.  Left foot: Moderate-large amount of swelling of the ankle and foot. Tenderness to palpation of lateral foot. Able to wiggle toes. N/V intact.    Neurological: She is alert and oriented to person, place, and time.  Skin: Skin is warm and dry.  Psychiatric: She has a normal mood and affect. Her behavior is normal.  Nursing note and vitals reviewed.    ED Treatments / Results  Labs (all labs ordered are listed, but only abnormal results are displayed) Labs Reviewed - No data to display  EKG  EKG Interpretation None       Radiology No results found.  Procedures Procedures (including critical care time)  Medications Ordered in ED Medications - No data to display   Initial Impression / Assessment and Plan / ED Course  I have reviewed the triage vital signs and the nursing notes.  Pertinent labs & imaging results that were available during my care of the patient were reviewed by me and  considered in my medical decision making (see chart for details).  36 year old female with atraumatic knee and foot pain presents with ongoing pain.  Vital signs are normal.  Unclear etiology.  She needs orthopedic follow-up. Her main barrier is lack of insurance.  She was given resources to apply for [facility] and given follow-up with [place] and wellness.  She is also wearing a compression stocking that does not go over her foot- it goes from her ankle to her knee.  I think this is  making her foot pain and swelling worse.  I advised her to wear the knee brace only and to continue the conservative measures that she has been doing at home.  I will give her a small amount of pain medicine but ultimately she needs to follow-up with orthopedics as there is not much we can do for her in the emergency department.   Final Clinical Impressions(s) / ED Diagnoses   Final diagnoses:  Acute pain of left knee  Left foot pain    ED Discharge Orders        Ordered    traMADol (ULTRAM) 50 MG tablet  Every 6 hours PRN     03/16/17 2038       Recardo Evangelist, PA-C 03/16/17 2230    Isla Pence, MD 03/16/17 2237

## 2017-03-16 NOTE — ED Notes (Signed)
Pt reports that she has been knee pain to left side radiating to foot  With swelling 10/10 throbbing pain x 3 week. Pt reports that she took tylenol or ibuprofen pt states that it was not effective. Pt reports she was suppose to f/u with Orthro but d/t weather she was unable to obtain appt.

## 2017-03-16 NOTE — Discharge Instructions (Signed)
Continue to rest, ice, and elevate Continue Ibuprofen and Tylenol for pain Take Tramadol for moderate to severe pain as needed Please establish care with primary doctor and look in to applying for [facility] insurance - paperwork has been provided

## 2017-03-16 NOTE — ED Triage Notes (Signed)
Patient reports that she been having pains in left knee for several months and over past week or so having pain on left lateral foot. Denied any injuries to cause pain.

## 2017-03-17 MED FILL — traMADol HCL 50 MG TABS: 50 | 4 days supply | Qty: 15 | Fill #0

## 2017-03-31 ENCOUNTER — Encounter: Payer: Self-pay | Admitting: Family Medicine

## 2017-03-31 ENCOUNTER — Ambulatory Visit (INDEPENDENT_AMBULATORY_CARE_PROVIDER_SITE_OTHER): Payer: Self-pay | Admitting: Family Medicine

## 2017-03-31 VITALS — BP 114/70 | HR 107 | Temp 98.4°F | Resp 16 | Ht 69.0 in | Wt 238.0 lb

## 2017-03-31 DIAGNOSIS — M79672 Pain in left foot: Secondary | ICD-10-CM

## 2017-03-31 DIAGNOSIS — L659 Nonscarring hair loss, unspecified: Secondary | ICD-10-CM

## 2017-03-31 DIAGNOSIS — D509 Iron deficiency anemia, unspecified: Secondary | ICD-10-CM

## 2017-03-31 DIAGNOSIS — M79671 Pain in right foot: Secondary | ICD-10-CM

## 2017-03-31 DIAGNOSIS — L732 Hidradenitis suppurativa: Secondary | ICD-10-CM

## 2017-03-31 DIAGNOSIS — G629 Polyneuropathy, unspecified: Secondary | ICD-10-CM

## 2017-03-31 LAB — POCT GLYCOSYLATED HEMOGLOBIN (HGB A1C): HEMOGLOBIN A1C: 5.3

## 2017-03-31 MED ORDER — DOXYCYCLINE HYCLATE 100 MG PO TABS: 100.0000 mg | ORAL_TABLET | Freq: Two times a day (BID) | ORAL | 0 refills | Status: DC

## 2017-03-31 MED ORDER — GABAPENTIN 300 MG PO CAPS: 300.0000 mg | ORAL_CAPSULE | Freq: Three times a day (TID) | ORAL | 3 refills | Status: DC

## 2017-03-31 MED FILL — DOXYCYCLINE 100 MG TABLET: 100 | 14 days supply | Qty: 28 | Fill #0

## 2017-03-31 MED FILL — GABAPENTIN 300 MG CAPSULE: 300 | 30 days supply | Qty: 90 | Fill #0

## 2017-03-31 NOTE — Patient Instructions (Addendum)
Will start Doxycycline 100 mg BID for hidradenitis  Follow a daily skin-care routine. Gently wash your body with a nonsoap cleanser such as Cetaphil. Use only your hands, not washcloths, loofahs or other items that might irritate the skin. If odor is a concern, try an antibacterial body wash. Then apply an over-the-counter antibiotic cream. It might also help to apply extra absorbent powder or zinc oxide. Using antiperspirants may help keep the skin dry. Stop using any product that irritates your skin.  Manage your pain. Gently applying a wet, warm washcloth, teabag or other sort of compress can help reduce swelling and ease pain. Keep it on for about 10 minutes. Ask your doctor to recommend the most appropriate pain reliever. And talk with your doctor about how to properly dress and care for your wounds at home.  Avoid tight clothes and irritating products. Wear loose, lightweight clothes to reduce friction. Some women find that using tampons rather than sanitary pads causes less friction with the skin. Use detergents and other products that are free of perfumes, dyes and enzymes.  Avoid injuring the skin. For example, don't squeeze the pimples and sores. And stop shaving affected skin.  Keep a healthy weight and stay active. Not being at a healthy weight can make symptoms worse. Try to find activities that don't irritate your skin.  Consider altering your diet. In an informal study, 6 people with hidradenitis suppurativa gave up dairy products and processed sugar and flour. Of those, 83 percent experienced reduced symptoms. Also, a study reported on 12 people being treated for hidradenitis suppurativa who avoided beer and other foods containing brewer's yeast or wheat. They all saw their symptoms clear up within a year.  Avoid all tobacco products. If you smoke, try to quit. Smoking and other tobacco use may play a role in making hidradenitis suppurativa worse.   Will start a trial of Gabapentin 300 mg  three times daily. Refrain from drinking, driving, or operating machinery while taking this medication.   Gabapentin capsules or tablets What is this medicine? GABAPENTIN (GA ba pen tin) is used to control partial seizures in adults with epilepsy. It is also used to treat certain types of nerve pain. This medicine may be used for other purposes; ask your health care provider or pharmacist if you have questions. COMMON BRAND NAME(S): Active-PAC with Gabapentin, Gabarone, Neurontin What should I tell my health care provider before I take this medicine? They need to know if you have any of these conditions: -kidney disease -suicidal thoughts, plans, or attempt; a previous suicide attempt by you or a family member -an unusual or allergic reaction to gabapentin, other medicines, foods, dyes, or preservatives -pregnant or trying to get pregnant -breast-feeding How should I use this medicine? Take this medicine by mouth with a glass of water. Follow the directions on the prescription label. You can take it with or without food. If it upsets your stomach, take it with food.Take your medicine at regular intervals. Do not take it more often than directed. Do not stop taking except on your doctor's advice. If you are directed to break the 600 or 800 mg tablets in half as part of your dose, the extra half tablet should be used for the next dose. If you have not used the extra half tablet within 28 days, it should be thrown away. A special MedGuide will be given to you by the pharmacist with each prescription and refill. Be sure to read this information carefully each time.  Talk to your pediatrician regarding the use of this medicine in children. Special care may be needed. Overdosage: If you think you have taken too much of this medicine contact a poison control center or emergency room at once. NOTE: This medicine is only for you. Do not share this medicine with others. What if I miss a dose? If you miss  a dose, take it as soon as you can. If it is almost time for your next dose, take only that dose. Do not take double or extra doses. What may interact with this medicine? Do not take this medicine with any of the following medications: -other gabapentin products This medicine may also interact with the following medications: -alcohol -antacids -antihistamines for allergy, cough and cold -certain medicines for anxiety or sleep -certain medicines for depression or psychotic disturbances -homatropine; hydrocodone -naproxen -narcotic medicines (opiates) for pain -phenothiazines like chlorpromazine, mesoridazine, prochlorperazine, thioridazine This list may not describe all possible interactions. Give your health care provider a list of all the medicines, herbs, non-prescription drugs, or dietary supplements you use. Also tell them if you smoke, drink alcohol, or use illegal drugs. Some items may interact with your medicine. What should I watch for while using this medicine? Visit your doctor or health care professional for regular checks on your progress. You may want to keep a record at home of how you feel your condition is responding to treatment. You may want to share this information with your doctor or health care professional at each visit. You should contact your doctor or health care professional if your seizures get worse or if you have any new types of seizures. Do not stop taking this medicine or any of your seizure medicines unless instructed by your doctor or health care professional. Stopping your medicine suddenly can increase your seizures or their severity. Wear a medical identification bracelet or chain if you are taking this medicine for seizures, and carry a card that lists all your medications. You may get drowsy, dizzy, or have blurred vision. Do not drive, use machinery, or do anything that needs mental alertness until you know how this medicine affects you. To reduce dizzy or  fainting spells, do not sit or stand up quickly, especially if you are an older patient. Alcohol can increase drowsiness and dizziness. Avoid alcoholic drinks. Your mouth may get dry. Chewing sugarless gum or sucking hard candy, and drinking plenty of water will help. The use of this medicine may increase the chance of suicidal thoughts or actions. Pay special attention to how you are responding while on this medicine. Any worsening of mood, or thoughts of suicide or dying should be reported to your health care professional right away. Women who become pregnant while using this medicine may enroll in the [place] Pregnancy Registry by calling [phone]. This registry collects information about the safety of antiepileptic drug use during pregnancy. What side effects may I notice from receiving this medicine? Side effects that you should report to your doctor or health care professional as soon as possible: -allergic reactions like skin rash, itching or hives, swelling of the face, lips, or tongue -worsening of mood, thoughts or actions of suicide or dying Side effects that usually do not require medical attention (report to your doctor or health care professional if they continue or are bothersome): -constipation -difficulty walking or controlling muscle movements -dizziness -nausea -slurred speech -tiredness -tremors -weight gain This list may not describe all possible side effects. Call your doctor for  medical advice about side effects. You may report side effects to FDA at 1-800-FDA-1088. Where should I keep my medicine? Keep out of reach of children. This medicine may cause accidental overdose and death if it taken by other adults, children, or pets. Mix any unused medicine with a substance like cat litter or coffee grounds. Then throw the medicine away in a sealed container like a sealed bag or a coffee can with a lid. Do not use the medicine after the expiration  date. Store at room temperature between 15 and 30 degrees C (59 and 86 degrees F). NOTE: This sheet is a summary. It may not cover all possible information. If you have questions about this medicine, talk to your doctor, pharmacist, or health care provider.  2018 Elsevier/Gold Standard (2013-05-19 15:26:50)

## 2017-03-31 NOTE — Progress Notes (Signed)
Subjective:    Patient ID: Amanda Davenport, female    DOB: [birth year], 36 y.o.   MRN: [id number]  HPI [name], a 36 year old female with a history of hidradenitis suppurative. Patient presents for evaluation of wounds that are primarily located on axilla bilaterally and groin. Initial onset of hidradenitis was several years ago. Patient states that draininge to axilla has worsened over the past several weeks. She is using pads underneath arms to control drainage. She endorses pain. Current pain intensity is 6/10 characterized as constant and aching. Syniyah was recently referred to dermatology, but has been unable to follow up due to insurance constraints. Patient does not have a history of type 2 diabetes mellitus.   She denies fever, chest pains, shortness of breath, dysuria, nausea, vomiting, or diarrhea.   Past Medical History:  Diagnosis Date  . Allergy   . Anemia    receives transfusions periodically  . Arrhythmia   . Chronic headache   . Nearsightedness    wears glasses  . Obesity   . Recurrent boils   . WPW (Wolff-Parkinson-White syndrome)    Immunization History  Administered Date(s) Administered  . Tdap 05/20/2015   Social History   Social History Narrative  . Not on file   Past Surgical History:  Procedure Laterality Date  . TONSILLECTOMY     Review of Systems  Constitutional: Negative.   HENT: Negative.   Eyes: Negative.   Respiratory: Negative.  Negative for shortness of breath and stridor.   Cardiovascular: Negative.   Gastrointestinal: Negative.   Endocrine: Negative.   Genitourinary: Negative.   Musculoskeletal: Negative.   Skin: Negative.        Draining wounds to axilla bilaterally.   Allergic/Immunologic: Negative.   Neurological: Negative.   Hematological: Negative.   Psychiatric/Behavioral: Negative.        Objective:   Physical Exam  Constitutional: She is oriented to person, place, and time.  HENT:  Head: Normocephalic and  atraumatic.  Right Ear: External ear normal.  Left Ear: External ear normal.  Nose: Nose normal.  Mouth/Throat: Oropharynx is clear and moist.  Eyes: Conjunctivae and EOM are normal. Pupils are equal, round, and reactive to light.  Neck: Normal range of motion. Neck supple.  Pulmonary/Chest: Effort normal and breath sounds normal.  Abdominal: Soft. Bowel sounds are normal.  Abdomen pendulous  Neurological: She is alert and oriented to person, place, and time. She has normal reflexes.  Skin: Skin is warm and dry.  Draining wounds to axilla bilaterally- Moderate drainage, tan, erythema  Psychiatric: She has a normal mood and affect. Her behavior is normal. Thought content normal.     BP 114/70 (BP Location: Right Arm, Patient Position: Sitting, Cuff Size: Normal)   Pulse (!) 107   Temp 98.4 F (36.9 C) (Oral)   Resp 16   Ht 5\' 9"  (1.753 m)   Wt 238 lb (108 kg)   LMP 03/10/2017 (Exact Date)   SpO2 100%   BMI 35.15 kg/m  Assessment & Plan:  Left axillary hidradenitis Clean area with Dial antibacterial soap. Will discuss laboratory results as they become available.  - doxycycline (VIBRA-TABS) 100 MG tablet; Take 1 tablet (100 mg total) by mouth 2 (two) times daily for 14 days.  Dispense: 28 tablet; Refill: 0 - WOUND CULTURE - Ambulatory referral to General Surgery  Neuropathy - gabapentin (NEURONTIN) 300 MG capsule; Take 1 capsule (300 mg total) by mouth 3 (three) times daily.  Dispense: 90 capsule;  Refill: 3 - HgB A1c  Iron deficiency anemia, unspecified iron deficiency anemia type - CBC with Differential  Hair loss - TSH  Bilateral foot pain - Uric Acid - Sedimentation Rate - gabapentin (NEURONTIN) 300 MG capsule; Take 1 capsule (300 mg total) by mouth 3 (three) times daily.  Dispense: 90 capsule; Refill: 3 - C-reactive protein   RTC: Follow up in 1 week for hidradenitis  [name]  MSN, FNP-C Patient [facility] [address]  [place], [place] [address] [phone]

## 2017-04-01 LAB — CBC WITH DIFFERENTIAL/PLATELET
BASOS: 0 %
Basophils Absolute: 0 10*3/uL (ref 0.0–0.2)
EOS (ABSOLUTE): 0.1 10*3/uL (ref 0.0–0.4)
Eos: 1 %
HEMOGLOBIN: 10 g/dL — AB (ref 11.1–15.9)
Hematocrit: 31.8 % — ABNORMAL LOW (ref 34.0–46.6)
IMMATURE GRANS (ABS): 0 10*3/uL (ref 0.0–0.1)
Immature Granulocytes: 0 %
LYMPHS ABS: 2.9 10*3/uL (ref 0.7–3.1)
LYMPHS: 29 %
MCH: 23.4 pg — AB (ref 26.6–33.0)
MCHC: 31.4 g/dL — ABNORMAL LOW (ref 31.5–35.7)
MCV: 75 fL — AB (ref 79–97)
Monocytes Absolute: 0.4 10*3/uL (ref 0.1–0.9)
Monocytes: 4 %
NEUTROS ABS: 6.6 10*3/uL (ref 1.4–7.0)
Neutrophils: 66 %
PLATELETS: 625 10*3/uL — AB (ref 150–379)
RBC: 4.27 x10E6/uL (ref 3.77–5.28)
RDW: 16.6 % — ABNORMAL HIGH (ref 12.3–15.4)
WBC: 10.1 10*3/uL (ref 3.4–10.8)

## 2017-04-01 LAB — SEDIMENTATION RATE: Sed Rate: 97 mm/hr — ABNORMAL HIGH (ref 0–32)

## 2017-04-01 LAB — C-REACTIVE PROTEIN: CRP: 41.8 mg/L — AB (ref 0.0–4.9)

## 2017-04-01 LAB — TSH: TSH: 1.39 u[IU]/mL (ref 0.450–4.500)

## 2017-04-01 LAB — URIC ACID: Uric Acid: 5.4 mg/dL (ref 2.5–7.1)

## 2017-04-02 ENCOUNTER — Telehealth: Payer: Self-pay

## 2017-04-02 NOTE — Telephone Encounter (Signed)
Called, no answer. No voicemail. Will try later. Thanks!

## 2017-04-02 NOTE — Telephone Encounter (Signed)
-----   Message from [name], [place] sent at 04/01/2017  4:36 PM EST ----- Regarding: lab results Please inform patient that hemoglobin is 10.0, which is improved. Recommend Ferrous sulfate 325 mg daily. Also recommend an iron rich diet.   Thanks

## 2017-04-03 LAB — WOUND CULTURE

## 2017-04-07 ENCOUNTER — Encounter: Payer: Self-pay | Admitting: Family Medicine

## 2017-04-07 ENCOUNTER — Ambulatory Visit (INDEPENDENT_AMBULATORY_CARE_PROVIDER_SITE_OTHER): Payer: Self-pay | Admitting: Family Medicine

## 2017-04-07 VITALS — BP 103/58 | HR 102 | Temp 98.5°F | Resp 14 | Ht 69.0 in | Wt 240.0 lb

## 2017-04-07 DIAGNOSIS — M79629 Pain in unspecified upper arm: Secondary | ICD-10-CM

## 2017-04-07 DIAGNOSIS — L732 Hidradenitis suppurativa: Secondary | ICD-10-CM

## 2017-04-07 MED ORDER — IBUPROFEN 800 MG PO TABS: 800.0000 mg | ORAL_TABLET | Freq: Four times a day (QID) | ORAL | 0 refills | Status: DC | PRN

## 2017-04-07 MED ORDER — DOXYCYCLINE HYCLATE 100 MG PO TABS: 100.0000 mg | ORAL_TABLET | Freq: Two times a day (BID) | ORAL | 0 refills | Status: AC

## 2017-04-07 MED ORDER — DOXYCYCLINE HYCLATE 100 MG PO TABS: 100.0000 mg | ORAL_TABLET | Freq: Two times a day (BID) | ORAL | 0 refills | Status: DC

## 2017-04-07 MED ORDER — KETOROLAC TROMETHAMINE 60 MG/2ML IM SOLN
60.0000 mg | Freq: Once | INTRAMUSCULAR | Status: AC
  Administered 2017-04-07: 60 mg via INTRAMUSCULAR

## 2017-04-07 MED FILL — IBUPROFEN 800 MG TABLET: 800 | 10 days supply | Qty: 40 | Fill #0

## 2017-04-07 MED FILL — DOXYCYCLINE 100 MG TABLET: 100 | 7 days supply | Qty: 14 | Fill #0

## 2017-04-07 NOTE — Telephone Encounter (Signed)
Called no answer. Will mail letter. Thanks!

## 2017-04-07 NOTE — Progress Notes (Signed)
Subjective:    Patient ID: Amanda Davenport, female    DOB: 1980-06-09, 37 y.o.   MRN: [id number]  HPI [name], a 37 year old female with a history of hidradenitis suppurative. Patient presents follow up of hidradenitis. Draining wounds are primarily located on axilla bilaterally and groin. Initial onset of hidradenitis was several years ago. Patient states that draininge to axilla improved over the past several days. She endorses tenderness and minimal drainage primarily to left axilla. . She is using pads underneath arms to control drainage.  Current pain intensity is 4 /10 characterized as constant and aching. Chi was recently referred to dermatology, but has been unable to follow up due to insurance constraints. Patient does not have a history of type 2 diabetes mellitus.   She denies fever, chest pains, shortness of breath, dysuria, nausea, vomiting, or diarrhea.   Past Medical History:  Diagnosis Date  . Allergy   . Anemia    receives transfusions periodically  . Arrhythmia   . Chronic headache   . Nearsightedness    wears glasses  . Obesity   . Recurrent boils   . WPW (Wolff-Parkinson-White syndrome)    Immunization History  Administered Date(s) Administered  . Tdap 05/20/2015   Social History   Social History Narrative  . Not on file   Past Surgical History:  Procedure Laterality Date  . TONSILLECTOMY     Review of Systems  Constitutional: Negative.   HENT: Negative.   Eyes: Negative.   Respiratory: Negative.  Negative for shortness of breath and stridor.   Cardiovascular: Negative.   Gastrointestinal: Negative.   Endocrine: Negative.   Genitourinary: Negative.   Musculoskeletal: Negative.   Skin: Negative.        Draining wounds to axilla bilaterally.   Allergic/Immunologic: Negative.   Neurological: Negative.   Hematological: Negative.   Psychiatric/Behavioral: Negative.        Objective:   Physical Exam  Constitutional: She is oriented  to person, place, and time.  HENT:  Head: Normocephalic and atraumatic.  Right Ear: External ear normal.  Left Ear: External ear normal.  Nose: Nose normal.  Mouth/Throat: Oropharynx is clear and moist.  Eyes: Conjunctivae and EOM are normal. Pupils are equal, round, and reactive to light.  Neck: Normal range of motion. Neck supple.  Pulmonary/Chest: Effort normal and breath sounds normal.  Abdominal: Soft. Bowel sounds are normal.  Abdomen pendulous  Neurological: She is alert and oriented to person, place, and time. She has normal reflexes.  Skin: Skin is warm and dry.  Draining wounds to axilla bilaterally- Moderate drainage, tan, erythema  Psychiatric: She has a normal mood and affect. Her behavior is normal. Thought content normal.     BP (!) 103/58 (BP Location: Right Arm, Patient Position: Sitting, Cuff Size: Large)   Pulse (!) 102   Temp 98.5 F (36.9 C) (Oral)   Resp 14   Ht 5\' 9"  (1.753 m)   Wt 240 lb (108.9 kg)   LMP 03/10/2017 (Exact Date)   SpO2 100%   BMI 35.44 kg/m  Assessment & Plan:  Left axillary hidradenitis Clean area with Dial antibacterial soap. Will discuss laboratory results as they become available.  - doxycycline (VIBRA-TABS) 100 MG tablet; Take 1 tablet (100 mg total) by mouth 2 (two) times daily for 14 days.  Dispense: 28 tablet; Refill: 0 - ibuprofen (ADVIL,MOTRIN) 800 MG tablet; Take 1 tablet (800 mg total) by mouth every 6 (six) hours as needed for fever  or headache (pain).  Dispense: 40 tablet; Refill: 0 - doxycycline (VIBRA-TABS) 100 MG tablet; Take 1 tablet (100 mg total) by mouth 2 (two) times daily for 7 days.  Dispense: 14 tablet; Refill: 0   Pain in axilla, unspecified laterality - ketorolac (TORADOL) injection 60 mg   RTC: 1 months for hidradenitis suppurativa   [name]  MSN, FNP-C Patient [facility] [address] [place], [place] [address] [phone]

## 2017-04-08 NOTE — Patient Instructions (Signed)
Hidradenitis Suppurativa Hidradenitis suppurativa is a long-term (chronic) skin disease that starts with blocked sweat glands or hair follicles. Bacteria may grow in these blocked openings of your skin. Hidradenitis suppurativa is like a severe form of acne that develops in areas of your body where acne would be unusual. It is most likely to affect the areas of your body where skin rubs against skin and becomes moist. This includes your:  Underarms.  Groin.  Genital areas.  Buttocks.  Upper thighs.  Breasts.  Hidradenitis suppurativa may start out with small pimples. The pimples can develop into deep sores that break open (rupture) and drain pus. Over time your skin may thicken and become scarred. Hidradenitis suppurativa cannot be passed from person to person. What are the causes? The exact cause of hidradenitis suppurativa is not known. This condition may be due to:  Female and female hormones. The condition is rare before and after puberty.  An overactive body defense system (immune system). Your immune system may overreact to the blocked hair follicles or sweat glands and cause swelling and pus-filled sores.  What increases the risk? You may have a higher risk of hidradenitis suppurativa if you:  Are a woman.  Are between ages 11 and 55.  Have a family history of hidradenitis suppurativa.  Have a personal history of acne.  Are overweight.  Smoke.  Take the drug lithium.  What are the signs or symptoms? The first signs of an outbreak are usually painful skin bumps that look like pimples. As the condition progresses:  Skin bumps may get bigger and grow deeper into the skin.  Bumps under the skin may rupture and drain smelly pus.  Skin may become itchy and infected.  Skin may thicken and scar.  Drainage may continue through tunnels under the skin (fistulas).  Walking and moving your arms can become painful.  How is this diagnosed? Your health care provider may  diagnose hidradenitis suppurativa based on your medical history and your signs and symptoms. A physical exam will also be done. You may need to see a health care provider who specializes in skin diseases (dermatologist). You may also have tests done to confirm the diagnosis. These can include:  Swabbing a sample of pus or drainage from your skin so it can be sent to the lab and tested for infection.  Blood tests to check for infection.  How is this treated? The same treatment will not work for everybody with hidradenitis suppurativa. Your treatment will depend on how severe your symptoms are. You may need to try several treatments to find what works best for you. Part of your treatment may include cleaning and bandaging (dressing) your wounds. You may also have to take medicines, such as the following:  Antibiotics.  Acne medicines.  Medicines to block or suppress the immune system.  A diabetes medicine (metformin) is sometimes used to treat this condition.  For women, birth control pills can sometimes help relieve symptoms.  You may need surgery if you have a severe case of hidradenitis suppurativa that does not respond to medicine. Surgery may involve:  Using a laser to clear the skin and remove hair follicles.  Opening and draining deep sores.  Removing the areas of skin that are diseased and scarred.  Follow these instructions at home:  Learn as much as you can about your disease, and work closely with your health care providers.  Take medicines only as directed by your health care provider.  If you were prescribed   an antibiotic medicine, finish it all even if you start to feel better.  If you are overweight, losing weight may be very helpful. Try to reach and maintain a healthy weight.  Do not use any tobacco products, including cigarettes, chewing tobacco, or electronic cigarettes. If you need help quitting, ask your health care provider.  Do not shave the areas where you  get hidradenitis suppurativa.  Do not wear deodorant.  Wear loose-fitting clothes.  Try not to overheat and get sweaty.  Take a daily bleach bath as directed by your health care provider. ? Fill your bathtub halfway with water. ? Pour in  cup of unscented household bleach. ? Soak for 5-10 minutes.  Cover sore areas with a warm, clean washcloth (compress) for 5-10 minutes. Contact a health care provider if:  You have a flare-up of hidradenitis suppurativa.  You have chills or a fever.  You are having trouble controlling your symptoms at home. This information is not intended to replace advice given to you by your health care provider. Make sure you discuss any questions you have with your health care provider. Document Released: 11/05/2003 Document Revised: 08/29/2015 Document Reviewed: 06/23/2013 Elsevier Interactive Patient Education  2018 [employer]  

## 2017-04-30 MED FILL — GABAPENTIN 300 MG CAPSULE: 300 | 30 days supply | Qty: 90 | Fill #1

## 2017-05-10 ENCOUNTER — Ambulatory Visit (INDEPENDENT_AMBULATORY_CARE_PROVIDER_SITE_OTHER): Payer: Self-pay | Admitting: Family Medicine

## 2017-05-10 ENCOUNTER — Encounter: Payer: Self-pay | Admitting: Family Medicine

## 2017-05-10 VITALS — BP 109/68 | HR 96 | Temp 98.5°F | Resp 16 | Ht 69.0 in | Wt 246.0 lb

## 2017-05-10 DIAGNOSIS — M79602 Pain in left arm: Secondary | ICD-10-CM

## 2017-05-10 DIAGNOSIS — L732 Hidradenitis suppurativa: Secondary | ICD-10-CM

## 2017-05-10 MED ORDER — DOXYCYCLINE HYCLATE 100 MG PO TABS
100.0000 mg | ORAL_TABLET | Freq: Two times a day (BID) | ORAL | 5 refills | Status: DC
Start: 2017-05-10 — End: 2017-09-23

## 2017-05-10 MED ORDER — KETOROLAC TROMETHAMINE 60 MG/2ML IM SOLN
60.0000 mg | Freq: Once | INTRAMUSCULAR | Status: AC
  Administered 2017-05-10: 60 mg via INTRAMUSCULAR

## 2017-05-10 MED ORDER — CLINDAMYCIN PHOSPHATE 1 % EX GEL: Freq: Two times a day (BID) | CUTANEOUS | 1 refills | Status: DC

## 2017-05-10 MED FILL — ?DOXYCYCLINE 100MG TABLET: 100 | 30 days supply | Qty: 60 | Fill #0

## 2017-05-10 MED FILL — CLINDAMYCIN PH 1% GEL: 1 | 15 days supply | Qty: 30 | Fill #0

## 2017-05-10 NOTE — Patient Instructions (Addendum)
We will start doxycycline 100 mg twice daily.  Also, will start clindamycin gel applied to affected area twice daily.  You have refills on current medications.  Keep left axilla clean and dry.  Use gauze to cover area, to protect from your clothing.Wendee Copp twice daily with Dial antibacterial soap. Hidradenitis Suppurativa Hidradenitis suppurativa is a long-term (chronic) skin disease that starts with blocked sweat glands or hair follicles. Bacteria may grow in these blocked openings of your skin. Hidradenitis suppurativa is like a severe form of acne that develops in areas of your body where acne would be unusual. It is most likely to affect the areas of your body where skin rubs against skin and becomes moist. This includes your:  Underarms.  Groin.  Genital areas.  Buttocks.  Upper thighs.  Breasts.  Hidradenitis suppurativa may start out with small pimples. The pimples can develop into deep sores that break open (rupture) and drain pus. Over time your skin may thicken and become scarred. Hidradenitis suppurativa cannot be passed from person to person. What are the causes? The exact cause of hidradenitis suppurativa is not known. This condition may be due to:  Female and female hormones. The condition is rare before and after puberty.  An overactive body defense system (immune system). Your immune system may overreact to the blocked hair follicles or sweat glands and cause swelling and pus-filled sores.  What increases the risk? You may have a higher risk of hidradenitis suppurativa if you:  Are a woman.  Are between ages 26 and 78.  Have a family history of hidradenitis suppurativa.  Have a personal history of acne.  Are overweight.  Smoke.  Take the drug lithium.  What are the signs or symptoms? The first signs of an outbreak are usually painful skin bumps that look like pimples. As the condition progresses:  Skin bumps may get bigger and grow deeper into the  skin.  Bumps under the skin may rupture and drain smelly pus.  Skin may become itchy and infected.  Skin may thicken and scar.  Drainage may continue through tunnels under the skin (fistulas).  Walking and moving your arms can become painful.  How is this diagnosed? Your health care provider may diagnose hidradenitis suppurativa based on your medical history and your signs and symptoms. A physical exam will also be done. You may need to see a health care provider who specializes in skin diseases (dermatologist). You may also have tests done to confirm the diagnosis. These can include:  Swabbing a sample of pus or drainage from your skin so it can be sent to the lab and tested for infection.  Blood tests to check for infection.  How is this treated? The same treatment will not work for everybody with hidradenitis suppurativa. Your treatment will depend on how severe your symptoms are. You may need to try several treatments to find what works best for you. Part of your treatment may include cleaning and bandaging (dressing) your wounds. You may also have to take medicines, such as the following:  Antibiotics.  Acne medicines.  Medicines to block or suppress the immune system.  A diabetes medicine (metformin) is sometimes used to treat this condition.  For women, birth control pills can sometimes help relieve symptoms.  You may need surgery if you have a severe case of hidradenitis suppurativa that does not respond to medicine. Surgery may involve:  Using a laser to clear the skin and remove hair follicles.  Opening and draining  deep sores.  Removing the areas of skin that are diseased and scarred.  Follow these instructions at home:  Learn as much as you can about your disease, and work closely with your health care providers.  Take medicines only as directed by your health care provider.  If you were prescribed an antibiotic medicine, finish it all even if you start to  feel better.  If you are overweight, losing weight may be very helpful. Try to reach and maintain a healthy weight.  Do not use any tobacco products, including cigarettes, chewing tobacco, or electronic cigarettes. If you need help quitting, ask your health care provider.  Do not shave the areas where you get hidradenitis suppurativa.  Do not wear deodorant.  Wear loose-fitting clothes.  Try not to overheat and get sweaty.  Take a daily bleach bath as directed by your health care provider. ? Fill your bathtub halfway with water. ? Pour in  cup of unscented household bleach. ? Soak for 5-10 minutes.  Cover sore areas with a warm, clean washcloth (compress) for 5-10 minutes. Contact a health care provider if:  You have a flare-up of hidradenitis suppurativa.  You have chills or a fever.  You are having trouble controlling your symptoms at home. This information is not intended to replace advice given to you by your health care provider. Make sure you discuss any questions you have with your health care provider. Document Released: 11/05/2003 Document Revised: 08/29/2015 Document Reviewed: 06/23/2013 Elsevier Interactive Patient Education  2018 [employer].

## 2017-05-10 NOTE — Progress Notes (Signed)
Chief Complaint  Patient presents with  . Follow-up    hydrodinitis     Subjective:    Patient ID: Amanda Davenport, female    DOB: April 01, 1981, 37 y.o.   MRN: [id number]  HPI Amanda Davenport, a 37 year old female with a history of hidradenitis suppurative presents accompanied by father for follow-up. Draining wounds are primarily located on left axilla bilaterally and groin. Initial onset of hidradenitis was several years ago. Patient states that draininge to left axilla has increased since completing doxycycline. She says that condition improved while on doxycycline.  She endorses tenderness and minimal drainage primarily to left axilla. She is using pads underneath arms to control drainage.  Current pain intensity is 4 /10 characterized as constant and aching. Amanda Davenport was recently referred to dermatology, but has been unable to follow up due to insurance constraints. Patient does not have a history of type 2 diabetes mellitus.   She denies fever, chest pains, shortness of breath, dysuria, nausea, vomiting, or diarrhea.   Past Medical History:  Diagnosis Date  . Allergy   . Anemia    receives transfusions periodically  . Arrhythmia   . Chronic headache   . Nearsightedness    wears glasses  . Obesity   . Recurrent boils   . WPW (Wolff-Parkinson-White syndrome)    Immunization History  Administered Date(s) Administered  . Tdap 05/20/2015   Social History   Social History Narrative  . Not on file   Past Surgical History:  Procedure Laterality Date  . TONSILLECTOMY     Review of Systems  Constitutional: Negative.   HENT: Negative.   Eyes: Negative.   Respiratory: Negative.  Negative for shortness of breath and stridor.   Cardiovascular: Negative.   Gastrointestinal: Negative.   Endocrine: Negative.   Genitourinary: Negative.   Musculoskeletal: Negative.   Skin: Negative.        Draining wounds to axilla bilaterally.   Allergic/Immunologic: Negative.   Neurological:  Negative.   Hematological: Negative.   Psychiatric/Behavioral: Negative.        Objective:   Physical Exam  Constitutional: She is oriented to person, place, and time.  HENT:  Head: Normocephalic and atraumatic.  Right Ear: External ear normal.  Left Ear: External ear normal.  Nose: Nose normal.  Mouth/Throat: Oropharynx is clear and moist.  Eyes: Conjunctivae and EOM are normal. Pupils are equal, round, and reactive to light.  Neck: Normal range of motion. Neck supple.  Pulmonary/Chest: Effort normal and breath sounds normal.  Abdominal: Soft. Bowel sounds are normal.  Abdomen pendulous  Neurological: She is alert and oriented to person, place, and time. She has normal reflexes.  Skin: Skin is warm and dry.  Draining wounds to left axilla- Moderate drainage, tan, erythema  Psychiatric: She has a normal mood and affect. Her behavior is normal. Thought content normal.     BP 109/68 (BP Location: Left Arm, Patient Position: Sitting, Cuff Size: Large)   Pulse 96   Temp 98.5 F (36.9 C) (Oral)   Resp 16   Ht 5\' 9"  (1.753 m)   Wt 246 lb (111.6 kg)   LMP 05/03/2017   SpO2 100%   BMI 36.33 kg/m  Assessment & Plan:   1. Hidradenitis suppurativa Clean area with Dial antibacterial soap. Will discuss laboratory results as they become available. - doxycycline (VIBRA-TABS) 100 MG tablet; Take 1 tablet (100 mg total) by mouth 2 (two) times daily.  Dispense: 60 tablet; Refill: 5 - clindamycin (CLINDAGEL) 1 %  gel; Apply topically 2 (two) times daily.  Dispense: 30 g; Refill: 1 - ketorolac (TORADOL) injection 60 mg  2. Morbid obesity ([place]) Recommend a lowfat, low carbohydrate diet divided over 5-6 small meals, increase water intake to 6-8 glasses, and 150 minutes per week of cardiovascular exercise.    3. Left arm pain - ketorolac (TORADOL) injection 60 mg   RTC: 1 months for hidradenitis suppurativa   [name]  MSN, FNP-C [facility] [address] [place], [place] [id number] [phone]

## 2017-05-30 ENCOUNTER — Emergency Department (HOSPITAL_COMMUNITY)
Admission: EM | Admit: 2017-05-30 | Discharge: 2017-05-30 | Disposition: A | Discharge status: Home / Self Care | Payer: Self-pay | Attending: Physician Assistant | Admitting: Physician Assistant

## 2017-05-30 ENCOUNTER — Other Ambulatory Visit: Payer: Self-pay

## 2017-05-30 ENCOUNTER — Emergency Department (HOSPITAL_COMMUNITY): Payer: Self-pay

## 2017-05-30 ENCOUNTER — Encounter (HOSPITAL_COMMUNITY): Payer: Self-pay | Admitting: Emergency Medicine

## 2017-05-30 ENCOUNTER — Emergency Department (HOSPITAL_BASED_OUTPATIENT_CLINIC_OR_DEPARTMENT_OTHER)
Admit: 2017-05-30 | Discharge: 2017-05-30 | Disposition: A | Discharge status: Home / Self Care | Payer: Self-pay | Attending: Physician Assistant | Admitting: Physician Assistant

## 2017-05-30 DIAGNOSIS — M79609 Pain in unspecified limb: Secondary | ICD-10-CM

## 2017-05-30 DIAGNOSIS — Z79899 Other long term (current) drug therapy: Secondary | ICD-10-CM | POA: Insufficient documentation

## 2017-05-30 DIAGNOSIS — M79605 Pain in left leg: Secondary | ICD-10-CM | POA: Insufficient documentation

## 2017-05-30 DIAGNOSIS — F17210 Nicotine dependence, cigarettes, uncomplicated: Secondary | ICD-10-CM | POA: Insufficient documentation

## 2017-05-30 HISTORY — DX: Pain in unspecified knee: M25.569

## 2017-05-30 LAB — CBC WITH DIFFERENTIAL/PLATELET
Basophils Absolute: 0.1 10*3/uL (ref 0.0–0.1)
Basophils Relative: 1 %
Eosinophils Absolute: 0.2 10*3/uL (ref 0.0–0.7)
Eosinophils Relative: 2 %
HCT: 31.8 % — ABNORMAL LOW (ref 36.0–46.0)
Hemoglobin: 10 g/dL — ABNORMAL LOW (ref 12.0–15.0)
Lymphocytes Relative: 32 %
Lymphs Abs: 2.8 10*3/uL (ref 0.7–4.0)
MCH: 22.9 pg — ABNORMAL LOW (ref 26.0–34.0)
MCHC: 31.4 g/dL (ref 30.0–36.0)
MCV: 72.9 fL — ABNORMAL LOW (ref 78.0–100.0)
Monocytes Absolute: 0.4 10*3/uL (ref 0.1–1.0)
Monocytes Relative: 5 %
Neutro Abs: 5.4 10*3/uL (ref 1.7–7.7)
Neutrophils Relative %: 60 %
Platelets: 623 10*3/uL — ABNORMAL HIGH (ref 150–400)
RBC: 4.36 MIL/uL (ref 3.87–5.11)
RDW: 17 % — ABNORMAL HIGH (ref 11.5–15.5)
WBC: 8.9 10*3/uL (ref 4.0–10.5)

## 2017-05-30 LAB — BASIC METABOLIC PANEL
Anion gap: 10 (ref 5–15)
BUN: 10 mg/dL (ref 6–20)
CO2: 21 mmol/L — ABNORMAL LOW (ref 22–32)
Calcium: 8.9 mg/dL (ref 8.9–10.3)
Chloride: 109 mmol/L (ref 101–111)
Creatinine, Ser: 0.72 mg/dL (ref 0.44–1.00)
GFR calc Af Amer: >60 mL/min (ref >60)
GFR calc non Af Amer: >60 mL/min (ref >60)
Glucose, Bld: 90 mg/dL (ref 65–99)
Potassium: 3.5 mmol/L (ref 3.5–5.1)
Sodium: 140 mmol/L (ref 135–145)

## 2017-05-30 LAB — URINALYSIS, ROUTINE W REFLEX MICROSCOPIC
Bacteria, UA: NONE SEEN
Bilirubin Urine: NEGATIVE
Glucose, UA: NEGATIVE mg/dL
Ketones, ur: NEGATIVE mg/dL
Nitrite: NEGATIVE
Protein, ur: NEGATIVE mg/dL
Specific Gravity, Urine: 1.024 (ref 1.005–1.030)
pH: 5 (ref 5.0–8.0)

## 2017-05-30 LAB — I-STAT BETA HCG BLOOD, ED (MC, WL, AP ONLY): I-stat hCG, quantitative: <5 m[IU]/mL (ref <5)

## 2017-05-30 MED ORDER — HYDROCODONE-ACETAMINOPHEN 5-325 MG PO TABS
2.0000 | ORAL_TABLET | Freq: Once | ORAL | Status: AC
  Administered 2017-05-30: 2 via ORAL
  Filled 2017-05-30: qty 2

## 2017-05-30 MED ORDER — METOPROLOL TARTRATE 25 MG PO TABS: 25.0000 mg | ORAL_TABLET | Freq: Two times a day (BID) | ORAL | 0 refills | Status: DC

## 2017-05-30 MED ORDER — HYDROCODONE-ACETAMINOPHEN 5-325 MG PO TABS: 1.0000 | ORAL_TABLET | Freq: Four times a day (QID) | ORAL | 0 refills | Status: DC | PRN

## 2017-05-30 MED ORDER — METOPROLOL TARTRATE 25 MG PO TABS: 50.0000 mg | ORAL_TABLET | Freq: Two times a day (BID) | ORAL | 0 refills | Status: DC

## 2017-05-30 MED ORDER — NAPROXEN 500 MG PO TABS: 500.0000 mg | ORAL_TABLET | Freq: Two times a day (BID) | ORAL | 0 refills | Status: DC

## 2017-05-30 NOTE — Discharge Instructions (Signed)
Medications: Lopressor, Naprosyn, Norco  Treatment: Resume taking Lopressor 25 mg twice daily.  Take Naprosyn twice daily.  Do not combine with ibuprofen or other NSAID medication.  For severe pain, he can take 1-2 Norco every 6 hours as needed.  Do not drink alcohol, drive, operate machinery or participate in any other potentially dangerous activities while taking opiate pain medication as it may make you sleepy. Do not take this medication with any other sedating medications, either prescription or over-the-counter. If you were prescribed Percocet or Vicodin, do not take these with acetaminophen (Tylenol) as it is already contained within these medications and overdose of Tylenol is dangerous.   This medication is an opiate (or narcotic) pain medication and can be habit forming.  Use it as little as possible to achieve adequate pain control.  Do not use or use it with extreme caution if you have a history of opiate abuse or dependence. This medication is intended for your use only - do not give any to anyone else and keep it in a secure place where nobody else, especially children, have access to it. It will also cause or worsen constipation, so you may want to consider taking an over-the-counter stool softener while you are taking this medication.  Follow-up: Please follow-up with your doctor as well as orthopedic doctor for further evaluation and treatment of your symptoms.  Please make your doctor aware of your new Lopressor prescription.  Please return to emergency department if you develop any new or worsening symptoms.

## 2017-05-30 NOTE — ED Triage Notes (Signed)
Pt reports 1 week hx of pain in l/leg below the knee. Can not bear weight on l/leg. Tx with Motrin with minimal decrease in pain.Stated that she has hx of fluid on l/knee.

## 2017-05-30 NOTE — ED Provider Notes (Signed)
[facility] DEPT Provider Note   CSN: [id number] Arrival date & time: 05/30/17  1306     History   Chief Complaint Chief Complaint  Patient presents with  . Leg Pain    HPI SYMPHANI Amanda Davenport is a 37 y.o. female with history of anemia, WPW, obesity who presents with left lower leg pain for the past week.  Patient reports having knee pain a couple months ago, which is improved, however this pain is worse.  It is worse with walking and movement of her leg.  Patient reports intermittent tingling to her leg and foot.  She reports her primary care provider recently diagnosed her with neuropathy and start her on gabapentin.  This is seem to help her symptoms, however she still has significant pain to her lower leg.  She reports since the gabapentin was started 2 months ago, she has had some shortness of breath on exertion.  Patient used to receive transfusions for anemia, however has not had a transfusion in a long time.  Patient denies any chest pain or pain with breathing.  She denies any recent immobilizations or exogenous estrogen use.  She has no history of blood clots.  She reports urinary frequency for the past several months and urgency.  She reports sometimes she is not able to make it to the bathroom.  She has not talked to her primary care provider about this.  HPI  Past Medical History:  Diagnosis Date  . Allergy   . Anemia    receives transfusions periodically  . Arrhythmia   . Chronic headache   . Knee pain   . Nearsightedness    wears glasses  . Obesity   . Recurrent boils   . WPW (Wolff-Parkinson-White syndrome)     Patient Active Problem List   Diagnosis Date Noted  . Facial cellulitis   . Otitis externa   . Dental abscess 11/27/2016  . 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 Absolute anemia 05/21/2015  . Neck strain 12/11/2014  . Hydradenitis 12/11/2014  . Dental caries 12/11/2014  . Constipation 12/03/2014  . Neck pain  12/03/2014  . Myalgia and myositis 11/27/2014  . 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 Morbid obesity ([place]) 11/27/2014  . Hematochezia 12/16/2012  . Wolff-Parkinson-White (WPW) syndrome 03/01/2012  . Iron deficiency anemia 02/16/2012  . Tachycardia 01/28/2012  . Hidradenitis suppurativa 08/18/2011  . AXILLARY ABSCESS 12/30/2006  . ANKLE PAIN 10/11/2006    Past Surgical History:  Procedure Laterality Date  . TONSILLECTOMY      OB History    Gravida Para Term Preterm AB Living   0             SAB TAB Ectopic Multiple Live Births                   Home Medications    Prior to Admission medications   Medication Sig Start Date End Date Taking? Authorizing Provider  acetaminophen (TYLENOL) 325 MG tablet Take 2 tablets (650 mg total) by mouth every 6 (six) hours as needed for mild pain or headache (or Fever >/= 101). 11/29/16   Barton Dubois, MD  clindamycin (CLINDAGEL) 1 % gel Apply topically 2 (two) times daily. 05/10/17   [name], FNP  doxycycline (VIBRA-TABS) 100 MG tablet Take 1 tablet (100 mg total) by mouth 2 (two) times daily. 05/10/17   [name], FNP  gabapentin (NEURONTIN) 300 MG capsule Take 1 capsule (300 mg total) by mouth 3 (  three) times daily. 03/31/17   [name], FNP  HYDROcodone-acetaminophen (NORCO/VICODIN) 5-325 MG tablet Take 1-2 tablets by mouth every 6 (six) hours as needed for severe pain. 05/30/17   Kavan Devan, Bea Graff, PA-C  ibuprofen (ADVIL,MOTRIN) 800 MG tablet Take 1 tablet (800 mg total) by mouth every 6 (six) hours as needed for fever or headache (pain). 04/07/17   [name], FNP  metoprolol tartrate (LOPRESSOR) 25 MG tablet Take 1 tablet (25 mg total) by mouth 2 (two) times daily. 05/30/17   Johna Kearl, Bea Graff, PA-C  naproxen (NAPROSYN) 500 MG tablet Take 1 tablet (500 mg total) by mouth 2 (two) times daily. 05/30/17   Frederica Kuster, PA-C    Family History Family History  Problem Relation Age of Onset  . Breast cancer  Mother   . Pulmonary embolism Mother        died of PE  . Colon cancer Mother   . Irritable bowel syndrome Mother   . Cancer Mother   . Hypertension Father   . Diabetes Paternal Grandmother   . Heart disease Neg Hx   . Stroke Neg Hx     Social History Social History   Tobacco Use  . Smoking status: Current Some Day Smoker    Years: 0.50    Types: Cigarettes  . Smokeless tobacco: Never Used  . Tobacco comment: smokes black and milds - last use early-mid August  Substance Use Topics  . Alcohol use: No  . Drug use: Yes    Types: Marijuana    Comment: 2+ times per month     Allergies   Other; Penicillins; Shellfish-derived products; Shrimp [shellfish allergy]; and Sulfa antibiotics   Review of Systems Review of Systems  Constitutional: Negative for chills and fever.  HENT: Negative for facial swelling and sore throat.   Respiratory: Positive for shortness of breath (on exertion only).   Cardiovascular: Negative for chest pain.  Gastrointestinal: Negative for abdominal pain, nausea and vomiting.  Genitourinary: Positive for frequency and urgency. Negative for dysuria.  Musculoskeletal: Positive for myalgias. Negative for back pain and joint swelling.  Skin: Negative for rash and wound.  Neurological: Negative for headaches.  Psychiatric/Behavioral: The patient is not nervous/anxious.      Physical Exam Updated Vital Signs BP 94/62 (BP Location: Right Arm)   Pulse 89   Temp 98.9 F (37.2 C) (Oral)   Resp 16   Wt 108.9 kg (240 lb)   LMP 05/30/2017 (Exact Date)   SpO2 100%   BMI 35.44 kg/m   Physical Exam  Constitutional: She appears well-developed and well-nourished. No distress.  HENT:  Head: Normocephalic and atraumatic.  Mouth/Throat: Oropharynx is clear and moist. No oropharyngeal exudate.  Eyes: Conjunctivae are normal. Pupils are equal, round, and reactive to light. Right eye exhibits no discharge. Left eye exhibits no discharge. No scleral icterus.    Neck: Normal range of motion. Neck supple. No thyromegaly present.  Cardiovascular: Normal rate, regular rhythm, normal heart sounds and intact distal pulses. Exam reveals no gallop and no friction rub.  No murmur heard. Pulmonary/Chest: Effort normal and breath sounds normal. No stridor. No respiratory distress. She has no wheezes. She has no rales.  Abdominal: Soft. Bowel sounds are normal. She exhibits no distension. There is no tenderness. There is no rebound and no guarding.  Musculoskeletal: She exhibits no edema.       Legs: Tightness and significant point tenderness noted over lateral leg below the knee, over peroneus longus; pain  worse with dorsiflexion Patient also with calf tenderness No significant edema noted to the leg DP pulses intact Sensation intact  Lymphadenopathy:    She has no cervical adenopathy.  Neurological: She is alert. Coordination normal.  Skin: Skin is warm and dry. No rash noted. She is not diaphoretic. No pallor.  Psychiatric: She has a normal mood and affect.  Nursing note and vitals reviewed.    ED Treatments / Results  Labs (all labs ordered are listed, but only abnormal results are displayed) Labs Reviewed  BASIC METABOLIC PANEL - Abnormal; Notable for the following components:      Result Value   CO2 21 (*)    All other components within normal limits  CBC WITH DIFFERENTIAL/PLATELET - Abnormal; Notable for the following components:   Hemoglobin 10.0 (*)    HCT 31.8 (*)    MCV 72.9 (*)    MCH 22.9 (*)    RDW 17.0 (*)    Platelets 623 (*)    All other components within normal limits  URINALYSIS, ROUTINE W REFLEX MICROSCOPIC - Abnormal; Notable for the following components:   Hgb urine dipstick MODERATE (*)    Leukocytes, UA SMALL (*)    Squamous Epithelial / LPF 0-5 (*)    All other components within normal limits  I-STAT BETA HCG BLOOD, ED ([facility], [facility], [facility] ONLY)    EKG  EKG Interpretation  Date/Time:  Sunday May 30 2017 15:16:24  EST Ventricular Rate:  107 PR Interval:    QRS Duration: 108 QT Interval:  370 QTC Calculation: 494 R Axis:   -24 Text Interpretation:  Sinus tachycardia Detla wave peresnin in laterl leads Normal sinus rhythm Reconfirmed by Thomasene Lot, [place] [phone]) on 05/30/2017 3:54:16 PM       Radiology Dg Chest 2 View  Result Date: 05/30/2017 CLINICAL DATA:  Dyspnea on exertion for 2 months after starting gabapentin. EXAM: CHEST  2 VIEW COMPARISON:  11/14/2016 chest radiograph. FINDINGS: Stable cardiomediastinal silhouette with top-normal heart size. No pneumothorax. No pleural effusion. Lungs appear clear, with no acute consolidative airspace disease and no pulmonary edema. IMPRESSION: No active cardiopulmonary disease.  Stable top-normal heart size. Electronically Signed   By: Ilona Sorrel M.D.   On: 05/30/2017 15:17    Procedures Procedures (including critical care time)  Medications Ordered in ED Medications  HYDROcodone-acetaminophen (NORCO/VICODIN) 5-325 MG per tablet 2 tablet (2 tablets Oral Given 05/30/17 1854)     Initial Impression / Assessment and Plan / ED Course  I have reviewed the triage vital signs and the nursing notes.  Pertinent labs & imaging results that were available during my care of the patient were reviewed by me and considered in my medical decision making (see chart for details).     Patient with suspected musculoskeletal pain, probable strain to peroneal muscle.  Patient also with shortness of breath for 2 months.  Chest x-ray is negative.  She has not been on Lopressor for her WPW.  Will restart this.  CBC is stable from 2 months ago.  BMP within normal limits.  DVT study is negative for the left leg.  Will treat with NSAIDs, short course of hydrocodone.  I reviewed the narcotic database and found no discrepancies.  Patient also advised to ice, elevate, and stay off of her leg.  Patient advised to follow-up with orthopedics for further evaluation and treatment of  her leg pain as well as with her PCP for further evaluation of her shortness of breath and urinary symptoms. I  discussed patient case with Dr. Thomasene Lot who guided the patient's management and agrees with plan.   Final Clinical Impressions(s) / ED Diagnoses   Final diagnoses:  Left leg pain    ED Discharge Orders        Ordered    metoprolol tartrate (LOPRESSOR) 25 MG tablet  2 times daily,   Status:  Discontinued     05/30/17 1829    naproxen (NAPROSYN) 500 MG tablet  2 times daily     05/30/17 1830    HYDROcodone-acetaminophen (NORCO/VICODIN) 5-325 MG tablet  Every 6 hours PRN     05/30/17 1830    metoprolol tartrate (LOPRESSOR) 25 MG tablet  2 times daily     05/30/17 1832       Frederica Kuster, PA-C 05/30/17 2242    Macarthur Critchley, MD 05/31/17 [phone]

## 2017-05-30 NOTE — ED Notes (Signed)
Pt is aware of need for urine but is unable to provide one.  Pt given water to encourage voiding.

## 2017-05-30 NOTE — Progress Notes (Signed)
Left lower extremity venous duplex completed. No evidence of a DVT, superficial thrombosis, or Kamaka's cyst. Toma Copier, RVS  05/30/2017, 4:44 PM

## 2017-05-31 MED FILL — NAPROXEN 500 MG TABLET: 500 | 15 days supply | Qty: 30 | Fill #0

## 2017-05-31 MED FILL — ?METOPROLOL 25 MG TABLET: 25 | 30 days supply | Qty: 60 | Fill #0

## 2017-06-02 ENCOUNTER — Telehealth: Payer: Self-pay

## 2017-06-02 ENCOUNTER — Other Ambulatory Visit: Payer: Self-pay

## 2017-06-02 DIAGNOSIS — L732 Hidradenitis suppurativa: Secondary | ICD-10-CM

## 2017-06-02 MED ORDER — IBUPROFEN 800 MG PO TABS: 800.0000 mg | ORAL_TABLET | Freq: Four times a day (QID) | ORAL | 0 refills | Status: DC | PRN

## 2017-06-02 MED FILL — IBUPROFEN 800 MG TABLET: 800 | 10 days supply | Qty: 40 | Fill #0

## 2017-06-02 MED FILL — GABAPENTIN 300 MG CAPSULE: 300 | 30 days supply | Qty: 90 | Fill #2

## 2017-06-02 MED FILL — ?DOXYCYCLINE 100MG TABLET: 100 | 30 days supply | Qty: 60 | Fill #1

## 2017-06-02 NOTE — Telephone Encounter (Signed)
REFILL FOR IBUPROFEN SENT INTO PHARMACY. THANKS!

## 2017-06-03 ENCOUNTER — Ambulatory Visit (INDEPENDENT_AMBULATORY_CARE_PROVIDER_SITE_OTHER): Payer: Self-pay | Admitting: Family Medicine

## 2017-06-03 ENCOUNTER — Encounter: Payer: Self-pay | Admitting: Family Medicine

## 2017-06-03 VITALS — BP 100/64 | HR 80 | Temp 98.4°F | Resp 16 | Ht 69.0 in | Wt 243.0 lb

## 2017-06-03 DIAGNOSIS — M7989 Other specified soft tissue disorders: Secondary | ICD-10-CM

## 2017-06-03 DIAGNOSIS — M79605 Pain in left leg: Secondary | ICD-10-CM

## 2017-06-03 DIAGNOSIS — N898 Other specified noninflammatory disorders of vagina: Secondary | ICD-10-CM

## 2017-06-03 LAB — POCT URINALYSIS DIP (DEVICE)
Bilirubin Urine: NEGATIVE
GLUCOSE, UA: NEGATIVE mg/dL
KETONES UR: NEGATIVE mg/dL
LEUKOCYTES UA: NEGATIVE
Nitrite: NEGATIVE
Protein, ur: NEGATIVE mg/dL
Specific Gravity, Urine: >=1.03 (ref 1.005–1.030)
Urobilinogen, UA: 0.2 mg/dL (ref 0.0–1.0)
pH: 6 (ref 5.0–8.0)

## 2017-06-03 MED ORDER — FLUCONAZOLE 150 MG PO TABS: 150.0000 mg | ORAL_TABLET | Freq: Once | ORAL | 0 refills | Status: AC

## 2017-06-03 MED ORDER — KETOROLAC TROMETHAMINE 60 MG/2ML IM SOLN
60.0000 mg | Freq: Once | INTRAMUSCULAR | Status: AC
  Administered 2017-06-03: 60 mg via INTRAMUSCULAR

## 2017-06-03 MED ORDER — PREDNISONE 20 MG PO TABS: 20.0000 mg | ORAL_TABLET | Freq: Every day | ORAL | 0 refills | Status: DC

## 2017-06-03 MED FILL — FLUCONAZOLE 150 MG TABLET: 150 | 1 days supply | Qty: 1 | Fill #0

## 2017-06-03 MED FILL — predniSONE 20 MG TABS: 20 | 7 days supply | Qty: 7 | Fill #0

## 2017-06-03 NOTE — Patient Instructions (Signed)
Will start a trial of prednisone 20 mg daily for left leg pain and swelling. Elevate left leg to heart level while at rest. Apply warm, moist compresses and use interchangeably with cool compresses.  Will follow up in office in 1 week.    The patient was given clear instructions to go to ER or return to medical center if symptoms do not improve, worsen or new problems develop. The patient verbalized understanding.

## 2017-06-03 NOTE — Progress Notes (Signed)
Subjective:    Amanda Davenport is a 37 y.o. female with a history of hidrodenitis suppurativawho presents with left lower leg pain and swelling. Onset of symptoms was 4 days ago. Patient was evaluated for this problem in the ER on 05/30/2017. She was treated with Percocet 5-325 and naproxen every 6 hours as needed for severe pain. She says that she has been taking medication with unsatisfactory relief. Current pain intensity is 7/10 characterized as intermittent and throbbing.Pain is aggravated by prolonged standing. Patient underwent a left knee xray on 03/10/2017, which showed small joint effusion.   Patient is also on chronic antibiotic therapy for hidradenitis suppurativa primarily to axilla. She says that problem has improved on doxycycline daily. However, patient is complaining of vaginal itching since starting antibiotic therapy. She describes vaginal discharge as thick and white. She has not attempted any OTC interventions to alleviate current symptoms.    Past Medical History:  Diagnosis Date  . Allergy   . Anemia    receives transfusions periodically  . Arrhythmia   . Chronic headache   . Knee pain   . Nearsightedness    wears glasses  . Obesity   . Recurrent boils   . WPW (Wolff-Parkinson-White syndrome)    Social History   Socioeconomic History  . Marital status: Single    Spouse name: Not on file  . Number of children: Not on file  . Years of education: Not on file  . Highest education level: Not on file  Social Needs  . Financial resource strain: Not on file  . Food insecurity - worry: Not on file  . Food insecurity - inability: Not on file  . Transportation needs - medical: Not on file  . Transportation needs - non-medical: Not on file  Occupational History  . Not on file  Tobacco Use  . Smoking status: Current Some Day Smoker    Years: 0.50    Types: Cigarettes  . Smokeless tobacco: Never Used  . Tobacco comment: smokes black and milds - last use early-mid  August  Substance and Sexual Activity  . Alcohol use: No  . Drug use: Yes    Types: Marijuana    Comment: 2+ times per month  . Sexual activity: Yes    Birth control/protection: None, Condom  Other Topics Concern  . Not on file  Social History Narrative  . Not on file   Immunization History  Administered Date(s) Administered  . Tdap 05/20/2015  .Review of Systems  Constitutional: Negative.   HENT: Negative.   Respiratory: Negative.   Cardiovascular: Negative.   Genitourinary: Negative.   Musculoskeletal: Positive for joint pain and myalgias.       Left leg pain  Skin: Negative.   Neurological: Negative.   Endo/Heme/Allergies: Negative.   Psychiatric/Behavioral: Negative.     Objective:  Physical Exam  Constitutional: She is well-developed, well-nourished, and in no distress.  Cardiovascular: Normal rate, regular rhythm and normal heart sounds.  Pulmonary/Chest: Effort normal and breath sounds normal.  Abdominal: Soft. Bowel sounds are normal.  Musculoskeletal:       Left knee: She exhibits decreased range of motion, swelling and erythema.       Legs:    Assessment:     BP 100/64 (BP Location: Left Arm, Patient Position: Sitting, Cuff Size: Normal)   Pulse 80   Temp 98.4 F (36.9 C) (Oral)   Resp 16   Ht 5\' 9"  (1.753 m)   Wt 243 lb (110.2 kg)  LMP 05/30/2017 (Exact Date)   SpO2 100%   BMI 35.88 kg/m   Plan:  1. Acute pain of left lower extremity Will start a trial of prednisone 20 mg daily.  Return for follow up in 1 week - predniSONE (DELTASONE) 20 MG tablet; Take 1 tablet (20 mg total) by mouth daily with breakfast.  Dispense: 7 tablet; Refill: 0 - ketorolac (TORADOL) injection 60 mg  2. Leg swelling - predniSONE (DELTASONE) 20 MG tablet; Take 1 tablet (20 mg total) by mouth daily with breakfast.  Dispense: 7 tablet; Refill: 0  3. Vaginal itching - POCT urinalysis dip (device) - Vaginitis/Vaginosis, DNA Probe - fluconazole (DIFLUCAN) 150 MG tablet;  Take 1 tablet (150 mg total) by mouth once for 1 dose.  Dispense: 1 tablet; Refill: 0      RTC: 1 week for left leg pain and swelling  The patient was given clear instructions to go to ER or return to medical center if symptoms do not improve, worsen or new problems develop. The patient verbalized understanding. Will notify patient with laboratory results.  [name]  MSN, FNP-C Patient [facility] [address] [place], [place] [address] [phone]

## 2017-06-05 LAB — VAGINITIS/VAGINOSIS, DNA PROBE
CANDIDA SPECIES: NEGATIVE
GARDNERELLA VAGINALIS: NEGATIVE
TRICHOMONAS VAG: NEGATIVE

## 2017-06-07 ENCOUNTER — Ambulatory Visit: Payer: Self-pay | Admitting: Family Medicine

## 2017-06-10 ENCOUNTER — Ambulatory Visit: Payer: Self-pay | Admitting: Family Medicine

## 2017-06-14 ENCOUNTER — Ambulatory Visit: Payer: Self-pay | Admitting: Family Medicine

## 2017-06-23 ENCOUNTER — Ambulatory Visit (INDEPENDENT_AMBULATORY_CARE_PROVIDER_SITE_OTHER): Payer: Self-pay | Admitting: Family Medicine

## 2017-06-23 VITALS — BP 106/66 | HR 72 | Temp 98.2°F | Resp 16 | Ht 69.0 in | Wt 253.0 lb

## 2017-06-23 DIAGNOSIS — L732 Hidradenitis suppurativa: Secondary | ICD-10-CM

## 2017-06-23 DIAGNOSIS — M79605 Pain in left leg: Secondary | ICD-10-CM

## 2017-06-23 MED ORDER — CHLORHEXIDINE GLUCONATE 4 % EX LIQD: Freq: Every day | CUTANEOUS | 5 refills | Status: DC | PRN

## 2017-06-23 MED ORDER — SPIRONOLACTONE 25 MG PO TABS: 25.0000 mg | ORAL_TABLET | Freq: Every day | ORAL | 5 refills | Status: DC

## 2017-06-23 MED ORDER — CLINDAMYCIN PHOSPHATE 1 % EX GEL: Freq: Two times a day (BID) | CUTANEOUS | 1 refills | Status: DC

## 2017-06-23 MED ORDER — SPIRONOLACTONE 25 MG PO TABS
12.5000 mg | ORAL_TABLET | Freq: Every day | ORAL | 5 refills | Status: DC
Start: 2017-06-23 — End: 2017-06-23

## 2017-06-23 MED FILL — CLINDAMYCIN PH 1% GEL: 1 | 15 days supply | Qty: 30 | Fill #0

## 2017-06-23 MED FILL — SPIRONOLACTONE 25 MG TABS: 25 | 30 days supply | Qty: 30 | Fill #0

## 2017-06-23 NOTE — Progress Notes (Signed)
Subjective:    Amanda Davenport is a 37 y.o. female with a history of hidrodenitis suppurativa who presents for follow up of left lower leg pain and swelling. Initial onset of symptoms was several weeks ago. Patient was evaluated for this problem in the ER on 05/30/2017. She was treated with Percocet 5-325 and naproxen every 6 hours as needed for severe pain without satisfactory relief. Patient was prescribed a 7 day course of steroids with maximum relief. She is no longer experiencing left leg pain.  Patient is also on chronic antibiotic therapy for hidradenitis suppurativa primarily to axilla. She says that problem has improved some on doxycycline daily. She continues to have open wounds to axilla bilaterally, thighs and abdominal folds.   Past Medical History:  Diagnosis Date  . Allergy   . Anemia    receives transfusions periodically  . Arrhythmia   . Chronic headache   . Knee pain   . Nearsightedness    wears glasses  . Obesity   . Recurrent boils   . WPW (Wolff-Parkinson-White syndrome)    Social History   Socioeconomic History  . Marital status: Single    Spouse name: Not on file  . Number of children: Not on file  . Years of education: Not on file  . Highest education level: Not on file  Social Needs  . Financial resource strain: Not on file  . Food insecurity - worry: Not on file  . Food insecurity - inability: Not on file  . Transportation needs - medical: Not on file  . Transportation needs - non-medical: Not on file  Occupational History  . Not on file  Tobacco Use  . Smoking status: Current Some Day Smoker    Years: 0.50    Types: Cigarettes  . Smokeless tobacco: Never Used  . Tobacco comment: smokes black and milds - last use early-mid August  Substance and Sexual Activity  . Alcohol use: No  . Drug use: Yes    Types: Marijuana    Comment: 2+ times per month  . Sexual activity: Yes    Birth control/protection: None, Condom  Other Topics Concern  . Not on  file  Social History Narrative  . Not on file   Immunization History  Administered Date(s) Administered  . Tdap 05/20/2015  .Review of Systems  Constitutional: Negative.   HENT: Negative.   Respiratory: Negative.   Cardiovascular: Negative.   Genitourinary: Negative.   Skin: Negative.        Hidradenitis suppurativa. Wounds to axilla, thighs, and abdominal folds.  Neurological: Negative.   Endo/Heme/Allergies: Negative.   Psychiatric/Behavioral: Negative.     Objective:  Physical Exam  Constitutional: She is well-developed, well-nourished, and in no distress.  Cardiovascular: Normal rate, regular rhythm and normal heart sounds.  Pulmonary/Chest: Effort normal and breath sounds normal.  Abdominal: Soft. Bowel sounds are normal.  Skin:     Hidradenitis, minimal tan drainage to axilla bilaterally. Tracts to axilla.        Assessment:     BP 106/66 (BP Location: Left Arm, Patient Position: Sitting, Cuff Size: Large)   Pulse 72   Temp 98.2 F (36.8 C) (Oral)   Resp 16   Ht 5\' 9"  (1.753 m)   Wt 253 lb (114.8 kg)   LMP 05/30/2017 (Exact Date)   SpO2 100%   BMI 37.36 kg/m   Plan:  Left leg pain Left leg pain improved maximally on prednisone trial. No further treatment warranted.   Hidradenitis suppurativa Will  start a trial of spironolactone 25 mg daily.  Will continue Doxycycline 100 mg BID.  Will also continue clindamycin gel twice daily.  Wash affected areas with Hibiclens - spironolactone (ALDACTONE) 25 MG tablet; Take 1 tablet (25 mg total) by mouth daily.  Dispense: 30 tablet; Refill: 5 - clindamycin (CLINDAGEL) 1 % gel; Apply topically 2 (two) times daily.  Dispense: 30 g; Refill: 1 - chlorhexidine (HIBICLENS) 4 % external liquid; Apply topically daily as needed.  Dispense: 120 mL; Refill: 5   RTC: 3 months for hidradenitis suppurativa   [name]  MSN, FNP-C Patient [facility] [address] [place],  [place] [address] [phone]    The patient was given clear instructions to go to ER or return to medical center if symptoms do not improve, worsen or new problems develop. The patient verbalized understanding.

## 2017-06-23 NOTE — Patient Instructions (Addendum)
For hydradenitis suppurativa we will start a trial of Spironolactone 25 mg daily. We will continue doxycycline as previously prescribed.  We will follow-up in 3 months.  Continue to hydrate with 3-4 bottles of water per day.  Also increase daily activity.   Hidradenitis Suppurativa Hidradenitis suppurativa is a long-term (chronic) skin disease that starts with blocked sweat glands or hair follicles. Bacteria may grow in these blocked openings of your skin. Hidradenitis suppurativa is like a severe form of acne that develops in areas of your body where acne would be unusual. It is most likely to affect the areas of your body where skin rubs against skin and becomes moist. This includes your:  Underarms.  Groin.  Genital areas.  Buttocks.  Upper thighs.  Breasts.  Hidradenitis suppurativa may start out with small pimples. The pimples can develop into deep sores that break open (rupture) and drain pus. Over time your skin may thicken and become scarred. Hidradenitis suppurativa cannot be passed from person to person. What are the causes? The exact cause of hidradenitis suppurativa is not known. This condition may be due to:  Female and female hormones. The condition is rare before and after puberty.  An overactive body defense system (immune system). Your immune system may overreact to the blocked hair follicles or sweat glands and cause swelling and pus-filled sores.  What increases the risk? You may have a higher risk of hidradenitis suppurativa if you:  Are a woman.  Are between ages 74 and 73.  Have a family history of hidradenitis suppurativa.  Have a personal history of acne.  Are overweight.  Smoke.  Take the drug lithium.  What are the signs or symptoms? The first signs of an outbreak are usually painful skin bumps that look like pimples. As the condition progresses:  Skin bumps may get bigger and grow deeper into the skin.  Bumps under the skin may rupture and  drain smelly pus.  Skin may become itchy and infected.  Skin may thicken and scar.  Drainage may continue through tunnels under the skin (fistulas).  Walking and moving your arms can become painful.  How is this diagnosed? Your health care provider may diagnose hidradenitis suppurativa based on your medical history and your signs and symptoms. A physical exam will also be done. You may need to see a health care provider who specializes in skin diseases (dermatologist). You may also have tests done to confirm the diagnosis. These can include:  Swabbing a sample of pus or drainage from your skin so it can be sent to the lab and tested for infection.  Blood tests to check for infection.  How is this treated? The same treatment will not work for everybody with hidradenitis suppurativa. Your treatment will depend on how severe your symptoms are. You may need to try several treatments to find what works best for you. Part of your treatment may include cleaning and bandaging (dressing) your wounds. You may also have to take medicines, such as the following:  Antibiotics.  Acne medicines.  Medicines to block or suppress the immune system.  A diabetes medicine (metformin) is sometimes used to treat this condition.  For women, birth control pills can sometimes help relieve symptoms.  You may need surgery if you have a severe case of hidradenitis suppurativa that does not respond to medicine. Surgery may involve:  Using a laser to clear the skin and remove hair follicles.  Opening and draining deep sores.  Removing the areas of skin  that are diseased and scarred.  Follow these instructions at home:  Learn as much as you can about your disease, and work closely with your health care providers.  Take medicines only as directed by your health care provider.  If you were prescribed an antibiotic medicine, finish it all even if you start to feel better.  If you are overweight, losing  weight may be very helpful. Try to reach and maintain a healthy weight.  Do not use any tobacco products, including cigarettes, chewing tobacco, or electronic cigarettes. If you need help quitting, ask your health care provider.  Do not shave the areas where you get hidradenitis suppurativa.  Do not wear deodorant.  Wear loose-fitting clothes.  Try not to overheat and get sweaty.  Take a daily bleach bath as directed by your health care provider. ? Fill your bathtub halfway with water. ? Pour in  cup of unscented household bleach. ? Soak for 5-10 minutes.  Cover sore areas with a warm, clean washcloth (compress) for 5-10 minutes. Contact a health care provider if:  You have a flare-up of hidradenitis suppurativa.  You have chills or a fever.  You are having trouble controlling your symptoms at home. This information is not intended to replace advice given to you by your health care provider. Make sure you discuss any questions you have with your health care provider. Document Released: 11/05/2003 Document Revised: 08/29/2015 Document Reviewed: 06/23/2013 Elsevier Interactive Patient Education  2018 [employer].

## 2017-06-25 ENCOUNTER — Other Ambulatory Visit: Payer: Self-pay

## 2017-06-25 MED ORDER — METOPROLOL TARTRATE 25 MG PO TABS: 25.0000 mg | ORAL_TABLET | Freq: Two times a day (BID) | ORAL | 0 refills | Status: DC

## 2017-06-25 MED FILL — DOXYCYCLINE 100 MG TABLET: 100 | 30 days supply | Qty: 60 | Fill #2

## 2017-06-25 MED FILL — METOPROLOL TARTRATE 25 MG T: 25 | 30 days supply | Qty: 60 | Fill #0

## 2017-06-26 ENCOUNTER — Encounter: Payer: Self-pay | Admitting: Family Medicine

## 2017-07-19 ENCOUNTER — Other Ambulatory Visit: Payer: Self-pay | Admitting: Family Medicine

## 2017-07-19 DIAGNOSIS — L732 Hidradenitis suppurativa: Secondary | ICD-10-CM

## 2017-07-19 MED FILL — IBUPROFEN 800 MG TABLET: 800 | 10 days supply | Qty: 40 | Fill #0

## 2017-07-20 MED FILL — GABAPENTIN 300 MG CAPSULE: 300 | 30 days supply | Qty: 90 | Fill #3

## 2017-09-23 ENCOUNTER — Encounter: Payer: Self-pay | Admitting: Family Medicine

## 2017-09-23 ENCOUNTER — Ambulatory Visit (INDEPENDENT_AMBULATORY_CARE_PROVIDER_SITE_OTHER): Payer: Self-pay | Admitting: Family Medicine

## 2017-09-23 VITALS — BP 108/64 | HR 101 | Temp 98.8°F | Resp 16 | Ht 69.0 in | Wt 257.0 lb

## 2017-09-23 DIAGNOSIS — F3289 Other specified depressive episodes: Secondary | ICD-10-CM

## 2017-09-23 DIAGNOSIS — L732 Hidradenitis suppurativa: Secondary | ICD-10-CM

## 2017-09-23 DIAGNOSIS — N898 Other specified noninflammatory disorders of vagina: Secondary | ICD-10-CM

## 2017-09-23 DIAGNOSIS — M79629 Pain in unspecified upper arm: Secondary | ICD-10-CM

## 2017-09-23 DIAGNOSIS — Z202 Contact with and (suspected) exposure to infections with a predominantly sexual mode of transmission: Secondary | ICD-10-CM

## 2017-09-23 MED ORDER — FLUCONAZOLE 150 MG PO TABS: 150.0000 mg | ORAL_TABLET | Freq: Once | ORAL | 0 refills | Status: AC

## 2017-09-23 MED ORDER — DOXYCYCLINE HYCLATE 100 MG PO TABS
100.0000 mg | ORAL_TABLET | Freq: Two times a day (BID) | ORAL | 5 refills | Status: DC
Start: 2017-09-23 — End: 2017-10-11

## 2017-09-23 MED ORDER — KETOROLAC TROMETHAMINE 60 MG/2ML IM SOLN
60.0000 mg | Freq: Once | INTRAMUSCULAR | Status: AC
  Administered 2017-09-23: 60 mg via INTRAMUSCULAR

## 2017-09-23 MED ORDER — FLUOXETINE HCL 20 MG PO TABS
20.0000 mg | ORAL_TABLET | Freq: Every day | ORAL | 3 refills | Status: DC
Start: 2017-09-23 — End: 2018-09-01

## 2017-09-23 NOTE — Patient Instructions (Addendum)
[facility] For depression, will start a trial of Fluoxetine 20 mg daily. Call my number if you need to.   Restart Doxycycline 100 mg twice daily for hidradenitis suppurativa   Follow a daily skin-care routine. Gently wash your body with a nonsoap cleanser such as Cetaphil. Use only your hands, not washcloths, loofahs or other items that might irritate the skin. If odor is a concern, try an antibacterial body wash. Then apply an over-the-counter antibiotic cream. It might also help to apply extra absorbent powder or zinc oxide. Using antiperspirants may help keep the skin dry. Stop using any product that irritates your skin.  Manage your pain. Gently applying a wet, warm washcloth, teabag or other sort of compress can help reduce swelling and ease pain. Keep it on for about 10 minutes. Ask your doctor to recommend the most appropriate pain reliever. And talk with your doctor about how to properly dress and care for your wounds at home.  Avoid tight clothes and irritating products. Wear loose, lightweight clothes to reduce friction. Some women find that using tampons rather than sanitary pads causes less friction with the skin. Use detergents and other products that are free of perfumes, dyes and enzymes.  Avoid injuring the skin. For example, don't squeeze the pimples and sores. And stop shaving affected skin.  Keep a healthy weight and stay active. Not being at a healthy weight can make symptoms worse. Try to find activities that don't irritate your skin.  Consider altering your diet. In an informal study, 42 people with hidradenitis suppurativa gave up dairy products and processed sugar and flour. Of those, 83 percent experienced reduced symptoms. Also, a study reported on 12 people being treated for hidradenitis suppurativa who avoided beer and other foods containing brewer's yeast or wheat. They all saw their symptoms clear up within a year.  Avoid all tobacco products.  If you smoke, try to quit. Smoking and other tobacco use may play a role in making hidradenitis suppurativa worse.

## 2017-09-23 NOTE — Progress Notes (Signed)
Subjective:    Amanda Davenport is a 37 y.o. female with a history of uncontrolled hidrodenitis suppurativa who presents for follow up.  Patient was previously on chronic antibiotic therapy for hidradenitis suppurativa primarily to axilla bilaterally. She was on doxycycline daily, she states that she has been unable to afford medications.  She continues to have open wounds to axilla bilaterally, thighs and abdominal folds. Current pain intensity is 7/10 characterized as constant, sore, and aching. She also endorses a moderate amount of drainage daily.   Patient is also complaining of worsening depression symptoms. She endorses anhedonia, hopelessness, and loneliness. She says, "I'm always alone, if I were to die no one would care". She denies suicidal or homicidal ideations on today. She has a history of depression, but has not been on medications or counseling.   Past Medical History:  Diagnosis Date  . Allergy   . Anemia    receives transfusions periodically  . Arrhythmia   . Chronic headache   . Knee pain   . Nearsightedness    wears glasses  . Obesity   . Recurrent boils   . WPW (Wolff-Parkinson-White syndrome)    Social History   Socioeconomic History  . Marital status: Single    Spouse name: Not on file  . Number of children: Not on file  . Years of education: Not on file  . Highest education level: Not on file  Occupational History  . Not on file  Social Needs  . Financial resource strain: Not on file  . Food insecurity:    Worry: Not on file    Inability: Not on file  . Transportation needs:    Medical: Not on file    Non-medical: Not on file  Tobacco Use  . Smoking status: Current Some Day Smoker    Years: 0.50    Types: Cigarettes  . Smokeless tobacco: Never Used  . Tobacco comment: smokes black and milds - last use early-mid August  Substance and Sexual Activity  . Alcohol use: No  . Drug use: Yes    Types: Marijuana    Comment: 2+ times per month  . Sexual  activity: Yes    Birth control/protection: None, Condom  Lifestyle  . Physical activity:    Days per week: Not on file    Minutes per session: Not on file  . Stress: Not on file  Relationships  . Social connections:    Talks on phone: Not on file    Gets together: Not on file    Attends religious service: Not on file    Active member of club or organization: Not on file    Attends meetings of clubs or organizations: Not on file    Relationship status: Not on file  . Intimate partner violence:    Fear of current or ex partner: Not on file    Emotionally abused: Not on file    Physically abused: Not on file    Forced sexual activity: Not on file  Other Topics Concern  . Not on file  Social History Narrative  . Not on file   Immunization History  Administered Date(s) Administered  . Tdap 05/20/2015  .Review of Systems  Constitutional: Negative.   HENT: Negative.   Respiratory: Negative.   Cardiovascular: Negative.   Genitourinary: Negative.   Skin: Negative.        Hidradenitis suppurativa. Wounds to axilla, thighs, and abdominal folds.  Neurological: Negative.   Endo/Heme/Allergies: Negative.   Psychiatric/Behavioral: Negative.  Objective:  Physical Exam  Constitutional: She is well-developed, well-nourished, and in no distress.  Cardiovascular: Normal rate, regular rhythm and normal heart sounds.  Pulmonary/Chest: Effort normal and breath sounds normal.  Abdominal: Soft. Bowel sounds are normal.  Skin:     Hidradenitis, minimal tan drainage to axilla bilaterally. Tracts to axilla.     Psychiatric: She exhibits a depressed mood. She expresses no homicidal and no suicidal ideation. She expresses no suicidal plans and no homicidal plans.  Tearful     Assessment:     BP 108/64 (BP Location: Left Arm, Patient Position: Sitting, Cuff Size: Large)   Pulse (!) 101   Temp 98.8 F (37.1 C) (Oral)   Resp 16   Ht 5\' 9"  (1.753 m)   Wt 257 lb (116.6 kg)   LMP  09/07/2017   SpO2 100%   BMI 37.95 kg/m   Plan:  1. Hidradenitis suppurativa Will restart doxycycline daily.  Will also send a referral to infectious disease for further evaluation of hidradenitis.  - doxycycline (VIBRA-TABS) 100 MG tablet; Take 1 tablet (100 mg total) by mouth 2 (two) times daily.  Dispense: 60 tablet; Refill: 5  2. Vaginal itching - fluconazole (DIFLUCAN) 150 MG tablet; Take 1 tablet (150 mg total) by mouth once for 1 dose.  Dispense: 4 tablet; Refill: 0  3. Possible exposure to STD - RPR - HIV antibody (with reflex) - Vaginitis/Vaginosis, DNA Probe - GC/Chlamydia Probe Amp([facility])  4. Pain in axilla, unspecified laterality - ketorolac (TORADOL) injection 60 mg  5. Other depression   Depression screen [facility] 2/9 09/23/2017 06/23/2017 06/03/2017 05/10/2017 04/07/2017  Decreased Interest 0 0 1 0 0  Down, Depressed, Hopeless 3 0 2 3 3   PHQ - 2 Score 3 0 3 3 3   Suicidal thoughts 2 - - - -   Patient given information for counseling through [facility].  Also, started a trial of fluoxetine 20 mg.  Patient to follow up in 6 weeks.  - FLUoxetine (PROZAC) 20 MG tablet; Take 1 tablet (20 mg total) by mouth daily.  Dispense: 30 tablet; Refill: 3    RTC: Discussed strict return precautions if symptoms of depression worsen   The patient was given clear instructions to go to ER or return to medical center if symptoms do not improve, worsen or new problems develop. The patient verbalized understanding.     Amanda Pounds  MSN, FNP-C Patient [facility] [address] [place], [place] [address] [phone]

## 2017-09-24 LAB — HIV ANTIBODY (ROUTINE TESTING W REFLEX): HIV Screen 4th Generation wRfx: NONREACTIVE

## 2017-09-24 LAB — RPR: RPR Ser Ql: NONREACTIVE

## 2017-09-25 LAB — VAGINITIS/VAGINOSIS, DNA PROBE
Candida Species: NEGATIVE
GARDNERELLA VAGINALIS: NEGATIVE
TRICHOMONAS VAG: NEGATIVE

## 2017-09-25 LAB — GC/CHLAMYDIA PROBE AMP
CHLAMYDIA, DNA PROBE: NEGATIVE
NEISSERIA GONORRHOEAE BY PCR: NEGATIVE

## 2017-10-01 ENCOUNTER — Other Ambulatory Visit: Payer: Self-pay | Admitting: Family Medicine

## 2017-10-01 DIAGNOSIS — M79672 Pain in left foot: Secondary | ICD-10-CM

## 2017-10-01 DIAGNOSIS — L732 Hidradenitis suppurativa: Secondary | ICD-10-CM

## 2017-10-01 DIAGNOSIS — M79671 Pain in right foot: Secondary | ICD-10-CM

## 2017-10-01 DIAGNOSIS — G629 Polyneuropathy, unspecified: Secondary | ICD-10-CM

## 2017-10-01 MED FILL — SPIRONOLACTONE 25 MG TABLET: 25 | 30 days supply | Qty: 30 | Fill #1

## 2017-10-04 MED FILL — GABAPENTIN 300 MG CAPSULE: 300 | 30 days supply | Qty: 90 | Fill #0

## 2017-10-04 MED FILL — METOPROLOL TARTRATE 25 MG T: 25 | 30 days supply | Qty: 60 | Fill #0

## 2017-10-04 MED FILL — IBUPROFEN 800 MG TABLET: 800 | 10 days supply | Qty: 40 | Fill #0

## 2017-10-11 ENCOUNTER — Encounter: Payer: Self-pay | Admitting: Internal Medicine

## 2017-10-11 ENCOUNTER — Ambulatory Visit (INDEPENDENT_AMBULATORY_CARE_PROVIDER_SITE_OTHER): Payer: Self-pay | Admitting: Internal Medicine

## 2017-10-11 DIAGNOSIS — L732 Hidradenitis suppurativa: Secondary | ICD-10-CM

## 2017-10-11 MED ORDER — DOXYCYCLINE HYCLATE 100 MG PO TABS: 100.0000 mg | ORAL_TABLET | Freq: Two times a day (BID) | ORAL | 0 refills | Status: DC

## 2017-10-11 MED FILL — DOXYCYCLINE HYCLATE 100 MG: 100 | 21 days supply | Qty: 42 | Fill #0

## 2017-10-11 NOTE — Progress Notes (Signed)
HPI: Amanda Davenport is a 37 y.o. female who is here to see Dr. Baxter Flattery for her hidradenitis.  Allergies: Allergies  Allergen Reactions  . Other Other (See Comments)    All Antibiotics cause severe vaginal yeast infections  . Penicillins Hives, Itching and Swelling    Has patient had a PCN reaction causing immediate rash, facial/tongue/throat swelling, SOB or lightheadedness with hypotension: Yes Has patient had a PCN reaction causing severe rash involving mucus membranes or skin necrosis: Yes Has patient had a PCN reaction that required hospitalization Yes Has patient had a PCN reaction occurring within the last 10 years: Yes If all of the above answers are "NO", then may proceed with Cephalosporin use.   . Shellfish-Derived Products Hives  . Shrimp [Shellfish Allergy] Hives  . Sulfa Antibiotics Rash    Vitals: Temp: 98 F (36.7 C) (07/08 1509) Temp Source: Oral (07/08 1509) BP: 105/72 (07/08 1509) Pulse Rate: 90 (07/08 1509)  Past Medical History: Past Medical History:  Diagnosis Date  . Allergy   . Anemia    receives transfusions periodically  . Arrhythmia   . Chronic headache   . Knee pain   . Nearsightedness    wears glasses  . Obesity   . Recurrent boils   . WPW (Wolff-Parkinson-White syndrome)     Social History: Social History   Socioeconomic History  . Marital status: Single    Spouse name: Not on file  . Number of children: Not on file  . Years of education: Not on file  . Highest education level: Not on file  Occupational History  . Not on file  Social Needs  . Financial resource strain: Not on file  . Food insecurity:    Worry: Not on file    Inability: Not on file  . Transportation needs:    Medical: Not on file    Non-medical: Not on file  Tobacco Use  . Smoking status: Current Some Day Smoker    Years: 0.50    Types: Cigarettes, Cigars  . Smokeless tobacco: Never Used  . Tobacco comment: smokes black and milds - last use early-mid August   Substance and Sexual Activity  . Alcohol use: No  . Drug use: Yes    Types: Marijuana    Comment: 2+ times per month  . Sexual activity: Yes    Birth control/protection: None, Condom  Lifestyle  . Physical activity:    Days per week: Not on file    Minutes per session: Not on file  . Stress: Not on file  Relationships  . Social connections:    Talks on phone: Not on file    Gets together: Not on file    Attends religious service: Not on file    Active member of club or organization: Not on file    Attends meetings of clubs or organizations: Not on file    Relationship status: Not on file  Other Topics Concern  . Not on file  Social History Narrative  . Not on file    Previous Regimen:   Current Regimen:   Labs: No results found for: HIV1RNAQUANT, HIV1RNAVL, CD4TABS, HEPBSAB, HEPBSAG, HCVAB  CrCl: CrCl cannot be calculated (Patient's most recent lab result is older than the maximum 21 days allowed.).  Lipids: No results found for: CHOL, TRIG, HDL, CHOLHDL, VLDL, LDLCALC  Assessment: Amanda is here today to see Dr. Baxter Flattery for her hidradenitis. Plan is for hibiclens washes and doxy x 21 days. A prescription was sent  to [facility] but she can't really afford anything. Therefore, we are going to send it to [facility] instead. They can give it to her for something that she can afford. Gave her the map to the pharmacy and she knows where it is and she will stop by after the visit.   Recommendations:  Doxy 100mg  PO BID x 21 days  Onnie Boer, PharmD, BCPS, AAHIVP, CPP [facility] 10/11/2017, 3:57 PM

## 2017-10-11 NOTE — Progress Notes (Signed)
RFV: follow up on hidradinitis  Patient ID: Amanda Davenport, female   DOB: Nov 18, 1980, 37 y.o.   MRN: [id number]  HPI Amanda Davenport is a 37yo F with hx of WPW syndrone, hidradinitis, depression, htn. She states that she was referred by her pcp to help with management of hidradinitis. She still has ongoing drainage from the areas, not inflammed. No fevers, chills, nightsweats  Outpatient Encounter Medications as of 10/11/2017  Medication Sig  . acetaminophen (TYLENOL) 325 MG tablet Take 2 tablets (650 mg total) by mouth every 6 (six) hours as needed for mild pain or headache (or Fever >/= 101).  . chlorhexidine (HIBICLENS) 4 % external liquid Apply topically daily as needed. (Patient not taking: Reported on 09/23/2017)  . clindamycin (CLINDAGEL) 1 % gel Apply topically 2 (two) times daily.  Marland Kitchen doxycycline (VIBRA-TABS) 100 MG tablet Take 1 tablet (100 mg total) by mouth 2 (two) times daily.  Marland Kitchen FLUoxetine (PROZAC) 20 MG tablet Take 1 tablet (20 mg total) by mouth daily.  Marland Kitchen gabapentin (NEURONTIN) 300 MG capsule TAKE 1 CAPSULE (300 MG TOTAL) BY MOUTH 3 (THREE) TIMES DAILY.  Marland Kitchen HYDROcodone-acetaminophen (NORCO/VICODIN) 5-325 MG tablet Take 1-2 tablets by mouth every 6 (six) hours as needed for severe pain. (Patient not taking: Reported on 06/03/2017)  . ibuprofen (ADVIL,MOTRIN) 800 MG tablet TAKE 1 TABLET BY MOUTH EVERY 6 (SIX) HOURS AS NEEDED FOR FEVER OR HEADACHE (PAIN).  Marland Kitchen metoprolol tartrate (LOPRESSOR) 25 MG tablet TAKE 1 TABLET (25 MG TOTAL) BY MOUTH 2 (TWO) TIMES DAILY.  Marland Kitchen spironolactone (ALDACTONE) 25 MG tablet Take 1 tablet (25 mg total) by mouth daily.   No facility-administered encounter medications on file as of 10/11/2017.      Patient Active Problem List   Diagnosis Date Noted  . Facial cellulitis   . Otitis externa   . Dental abscess 11/27/2016  . 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 Absolute anemia 05/21/2015  . Neck strain 12/11/2014  . Hydradenitis 12/11/2014  . Dental  caries 12/11/2014  . Constipation 12/03/2014  . Neck pain 12/03/2014  . Myalgia and myositis 11/27/2014  . 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 Morbid obesity ([place]) 11/27/2014  . Hematochezia 12/16/2012  . Wolff-Parkinson-White (WPW) syndrome 03/01/2012  . Iron deficiency anemia 02/16/2012  . Tachycardia 01/28/2012  . Hidradenitis suppurativa 08/18/2011  . AXILLARY ABSCESS 12/30/2006  . ANKLE PAIN 10/11/2006   family history includes Breast cancer in her mother; Cancer in her mother; Colon cancer in her mother; Diabetes in her paternal grandmother; Hypertension in her father; Irritable bowel syndrome in her mother; Pulmonary embolism in her mother.    reports that she has been smoking cigarettes and cigars.  She has smoked for the past 0.50 years. She has never used smokeless tobacco. She reports that she has current or past drug history. Drug: Marijuana. She reports that she does not drink alcohol.   There are no preventive care reminders to display for this patient.   Review of Systems +affected skin condition, but otherwise 12 point ros is negative Physical Exam   BP 105/72   Pulse 90   Temp 98 F (36.7 C) (Oral)   Wt 253 lb (114.8 kg)   BMI 37.36 kg/m  gen = a xo by 4 in nad Skin = axilla shows evidence of stage 3 hidradinitis = scar tissue with what appears fistula tracks. Serous drainage. Some with tan drainage Lab Results  Component Value Date   LABRPR Non Reactive 09/23/2017    CBC Lab  Results  Component Value Date   WBC 8.9 05/30/2017   RBC 4.36 05/30/2017   HGB 10.0 (L) 05/30/2017   HCT 31.8 (L) 05/30/2017   PLT 623 (H) 05/30/2017   MCV 72.9 (L) 05/30/2017   MCH 22.9 (L) 05/30/2017   MCHC 31.4 05/30/2017   RDW 17.0 (H) 05/30/2017   LYMPHSABS 2.8 05/30/2017   MONOABS 0.4 05/30/2017   EOSABS 0.2 05/30/2017    BMET Lab Results  Component Value Date   NA 140 05/30/2017   K 3.5 05/30/2017   CL 109 05/30/2017   CO2 21 (L) 05/30/2017   GLUCOSE 90  05/30/2017   BUN 10 05/30/2017   CREATININE 0.72 05/30/2017   CALCIUM 8.9 05/30/2017   GFRNONAA >60 05/30/2017   GFRAA >60 05/30/2017      Assessment and Plan  hidradinitis = will try to secure low cost 3 wks of doxycycline - would do doxy for longer period but needs to get [facility], hibiclens washout weekly  -  Surgical referral to [facility]

## 2017-10-12 MED FILL — FLUoxetine HCL 20 MG CAPS: 20 | 30 days supply | Qty: 30 | Fill #0

## 2017-11-05 MED FILL — SPIRONOLACTONE 25 MG TABLET: 25 | 30 days supply | Qty: 30 | Fill #2

## 2017-11-05 MED FILL — FLUoxetine HCL 20 MG CAPS: 20 | 30 days supply | Qty: 30 | Fill #1

## 2017-11-08 MED FILL — DOXYCYCLINE HYCLATE 100 MG: 100 | 30 days supply | Qty: 60 | Fill #0

## 2017-11-10 ENCOUNTER — Other Ambulatory Visit: Payer: Self-pay | Admitting: Family Medicine

## 2017-11-10 ENCOUNTER — Encounter: Payer: Self-pay | Admitting: Family Medicine

## 2017-11-10 ENCOUNTER — Ambulatory Visit (INDEPENDENT_AMBULATORY_CARE_PROVIDER_SITE_OTHER): Payer: Self-pay | Admitting: Family Medicine

## 2017-11-10 VITALS — BP 120/59 | HR 83 | Temp 98.7°F | Resp 16 | Ht 69.0 in | Wt 257.0 lb

## 2017-11-10 DIAGNOSIS — L732 Hidradenitis suppurativa: Secondary | ICD-10-CM

## 2017-11-10 DIAGNOSIS — M79644 Pain in right finger(s): Secondary | ICD-10-CM

## 2017-11-10 MED ORDER — PREDNISONE 5 MG PO TABS: 5.0000 mg | ORAL_TABLET | ORAL | 0 refills | Status: DC

## 2017-11-10 MED ORDER — BACITRACIN 500 UNIT/GM EX OINT: 1.0000 "application " | TOPICAL_OINTMENT | Freq: Two times a day (BID) | CUTANEOUS | 0 refills | Status: DC

## 2017-11-10 MED FILL — predniSONE 5 MG TABS: 5 | 6 days supply | Qty: 21 | Fill #0

## 2017-11-10 NOTE — Progress Notes (Signed)
Subjective   Amanda Davenport 37 y.o. female  [id number]  [id number]  1980/11/23 37 y.o.     Chief Complaint  Patient presents with  . Arm Pain    tingling on right arm   . Finger Injury    thumb on right hand swollen     Patient with a hx of hidradenitis suppertiva. Was referred to ID. Patient states that she will need to be referred to general surgery at [facility]. Patient states that she has a flare that is painful and leaking. Patient states that she is taking doxy PO bid and clindagel.   Hand Pain   The incident occurred 5 to 7 days ago. Incident location: no injury. There was no injury mechanism. The pain is present in the right fingers (right thumb). The quality of the pain is described as aching. The pain does not radiate. The pain is at a severity of 6/10. The pain is moderate. The pain has been intermittent since the incident. Associated symptoms include tingling (right wrist. ). The symptoms are aggravated by movement. She has tried NSAIDs for the symptoms. The treatment provided mild (hx of gout) relief.    Past Medical History:  Diagnosis Date  . Allergy   . Anemia    receives transfusions periodically  . Arrhythmia   . Chronic headache   . Knee pain   . Nearsightedness    wears glasses  . Obesity   . Recurrent boils   . WPW (Wolff-Parkinson-White syndrome)     Social History   Socioeconomic History  . Marital status: Single    Spouse name: Not on file  . Number of children: Not on file  . Years of education: Not on file  . Highest education level: Not on file  Occupational History  . Not on file  Social Needs  . Financial resource strain: Not on file  . Food insecurity:    Worry: Not on file    Inability: Not on file  . Transportation needs:    Medical: Not on file    Non-medical: Not on file  Tobacco Use  . Smoking status: Current Some Day Smoker    Years: 0.50    Types: Cigarettes, Cigars  . Smokeless tobacco: Never Used  .  Tobacco comment: smokes black and milds - last use early-mid August  Substance and Sexual Activity  . Alcohol use: No  . Drug use: Yes    Types: Marijuana    Comment: 2+ times per month  . Sexual activity: Yes    Birth control/protection: None, Condom  Lifestyle  . Physical activity:    Days per week: Not on file    Minutes per session: Not on file  . Stress: Not on file  Relationships  . Social connections:    Talks on phone: Not on file    Gets together: Not on file    Attends religious service: Not on file    Active member of club or organization: Not on file    Attends meetings of clubs or organizations: Not on file    Relationship status: Not on file  . Intimate partner violence:    Fear of current or ex partner: Not on file    Emotionally abused: Not on file    Physically abused: Not on file    Forced sexual activity: Not on file  Other Topics Concern  . Not on file  Social History Narrative  . Not on file  Review of Systems  Constitutional: Negative.   Musculoskeletal: Positive for joint pain (right thumb).  Skin:       hidradenitis outbreak  Neurological: Positive for tingling (right wrist. ).  Psychiatric/Behavioral: Negative.     Objective   Physical Exam  Constitutional: She is oriented to person, place, and time. She appears well-developed and well-nourished. No distress.  HENT:  Head: Normocephalic and atraumatic.  Eyes: Pupils are equal, round, and reactive to light. Conjunctivae and EOM are normal.  Neck: Normal range of motion. Neck supple. No JVD present. No thyromegaly (mild enlargement) present.  Cardiovascular: Normal rate, regular rhythm, normal heart sounds and intact distal pulses. Exam reveals no gallop and no friction rub.  No murmur heard. Pulmonary/Chest: Effort normal and breath sounds normal. No respiratory distress. She has no wheezes. She has no rales. She exhibits no tenderness.  Musculoskeletal: She exhibits edema (right thumb  with limited ROM due to pain. There is swelling noted to the thumb. No erythema. TTp).  Lymphadenopathy:    She has no cervical adenopathy.  Neurological: She is alert and oriented to person, place, and time.  Skin: Skin is warm and dry. Lesion (Multiple pustules noted to the left chest and axilla. Wound culture taken. ) noted.     Psychiatric: She has a normal mood and affect. Her behavior is normal. Judgment and thought content normal.  Nursing note and vitals reviewed.   BP (!) 120/59 (BP Location: Left Arm, Patient Position: Sitting, Cuff Size: Large)   Pulse 83   Temp 98.7 F (37.1 C) (Oral)   Resp 16   Ht 5\' 9"  (1.753 m)   Wt 257 lb (116.6 kg)   LMP 10/27/2017   SpO2 100%   BMI 37.95 kg/m   Assessment   Encounter Diagnoses  Name Primary?  . Thumb pain, right Yes  . Hidradenitis suppurativa      Plan  1. Thumb pain, right Hx of gout. Will treat with oral steroid.  - Uric Acid - CBC with Differential - predniSONE (DELTASONE) 5 MG tablet; Take 1 tablet (5 mg total) by mouth as directed. Day 1: 10 mg before breakfast, 10 mg at lunch , 10 mg at bedtime Day 2: 5 mg at breakfast, 10 mg at lunch, 5 mg at dinner, 5 mg at bedtime Day 3: 5 mg 4 times daily (with meals and at bedtime) Day 4: 5 mg 3 times daily (breakfast, lunch, bedtime) Day 5: 5 mg 2 times daily (breakfast, bedtime) Day 6: 5 mg before breakfast  Dispense: 21 tablet; Refill: 0  2. Hidradenitis suppurativa Steroid to help with inflammation. Continue with doxy. Use antibacterial soap such as dial or Hibiclens. Cover with bacitracin.  - predniSONE (DELTASONE) 5 MG tablet; Take 1 tablet (5 mg total) by mouth as directed. Day 1: 10 mg before breakfast, 10 mg at lunch , 10 mg at bedtime Day 2: 5 mg at breakfast, 10 mg at lunch, 5 mg at dinner, 5 mg at bedtime Day 3: 5 mg 4 times daily (with meals and at bedtime) Day 4: 5 mg 3 times daily (breakfast, lunch, bedtime) Day 5: 5 mg 2 times daily (breakfast, bedtime) Day 6: 5 mg  before breakfast  Dispense: 21 tablet; Refill: 0 - Vitamin D, 25-hydroxy - bacitracin 500 UNIT/GM ointment; Apply 1 application topically 2 (two) times daily.  Dispense: 30 g; Refill: 0 - WOUND CULTURE  Return to care as scheduled and prn. Patient verbalized understanding and agreed with plan of care.  Ms. Doug Sou. Nathaneil Canary, FNP-BC [facility] [address] [place], [place] [address] [phone]     This note has been created with Dragon speech recognition software and smart phrase technology. Any transcriptional errors are unintentional.

## 2017-11-11 LAB — VITAMIN D 25 HYDROXY (VIT D DEFICIENCY, FRACTURES): Vit D, 25-Hydroxy: 9.8 ng/mL — ABNORMAL LOW (ref 30.0–100.0)

## 2017-11-11 LAB — CBC WITH DIFFERENTIAL/PLATELET
Basophils Absolute: 0 10*3/uL (ref 0.0–0.2)
Basos: 0 %
EOS (ABSOLUTE): 0.2 10*3/uL (ref 0.0–0.4)
Eos: 2 %
Hematocrit: 28.9 % — ABNORMAL LOW (ref 34.0–46.6)
Hemoglobin: 8.4 g/dL — ABNORMAL LOW (ref 11.1–15.9)
Immature Grans (Abs): 0 10*3/uL (ref 0.0–0.1)
Immature Granulocytes: 0 %
Lymphocytes Absolute: 3.5 10*3/uL — ABNORMAL HIGH (ref 0.7–3.1)
Lymphs: 41 %
MCH: 20.4 pg — ABNORMAL LOW (ref 26.6–33.0)
MCHC: 29.1 g/dL — ABNORMAL LOW (ref 31.5–35.7)
MCV: 70 fL — ABNORMAL LOW (ref 79–97)
Monocytes Absolute: 0.4 10*3/uL (ref 0.1–0.9)
Monocytes: 5 %
Neutrophils Absolute: 4.6 10*3/uL (ref 1.4–7.0)
Neutrophils: 52 %
Platelets: 496 10*3/uL — ABNORMAL HIGH (ref 150–450)
RBC: 4.12 x10E6/uL (ref 3.77–5.28)
RDW: 18.6 % — ABNORMAL HIGH (ref 12.3–15.4)
WBC: 8.8 10*3/uL (ref 3.4–10.8)

## 2017-11-11 LAB — URIC ACID: Uric Acid: 6 mg/dL (ref 2.5–7.1)

## 2017-11-11 MED FILL — METOPROLOL TARTRATE 25 MG T: 25 | 30 days supply | Qty: 60 | Fill #0

## 2017-11-14 LAB — WOUND CULTURE: Organism ID, Bacteria: NONE SEEN

## 2017-11-17 ENCOUNTER — Telehealth: Payer: Self-pay

## 2017-11-17 ENCOUNTER — Other Ambulatory Visit: Payer: Self-pay | Admitting: Family Medicine

## 2017-11-17 MED ORDER — FERROUS SULFATE DRIED ER 160 (50 FE) MG PO TBCR: 1.0000 | EXTENDED_RELEASE_TABLET | Freq: Every day | ORAL | 2 refills | Status: DC

## 2017-11-17 MED ORDER — VITAMIN D (ERGOCALCIFEROL) 1.25 MG (50000 UNIT) PO CAPS: 50000.0000 [IU] | ORAL_CAPSULE | ORAL | 0 refills | Status: AC

## 2017-11-17 NOTE — Telephone Encounter (Signed)
Called and spoke with patient. Advised that wound culture was negative. Advised that anemia was present and she will need iron supplement and that vitamin D was low and we will start a vitamin D supplement. Patient verbalized understanding and that these medications have been sent into pharmacy and to pick these up and take as directed. Also advised that uric acid was normal. Thanks!

## 2017-11-17 NOTE — Progress Notes (Signed)
Sent to [facility].

## 2017-11-17 NOTE — Telephone Encounter (Signed)
Called, no answer. Left a voicemail for patient to call back. Thanks!  

## 2017-11-17 NOTE — Telephone Encounter (Signed)
-----   Message from Lanae Boast, [place] sent at 11/17/2017  9:31 AM EDT ----- Wound culture negative. Anemia present. Will need iron supplement. Vitamin D is very low. Will send meds to pharm on file. Uric acid is normal. Pain may not have been due to gout.  Thanks.

## 2017-11-17 NOTE — Telephone Encounter (Signed)
-----   Message from Lanae Boast, [place] sent at 11/17/2017  9:31 AM EDT ----- Wound culture negative. Anemia present. Will need iron supplement. Vitamin D is very low. Will send meds to pharm on file. Uric acid is normal. Pain may not have been due to gout.  Thanks.

## 2017-11-22 ENCOUNTER — Other Ambulatory Visit: Payer: Self-pay | Admitting: Family Medicine

## 2017-11-22 DIAGNOSIS — L732 Hidradenitis suppurativa: Secondary | ICD-10-CM

## 2017-11-22 MED FILL — GABAPENTIN 300 MG CAPSULE: 300 | 30 days supply | Qty: 90 | Fill #1

## 2017-11-24 ENCOUNTER — Ambulatory Visit: Payer: Self-pay | Admitting: Family Medicine

## 2017-11-24 ENCOUNTER — Ambulatory Visit (INDEPENDENT_AMBULATORY_CARE_PROVIDER_SITE_OTHER): Payer: Self-pay | Admitting: Family Medicine

## 2017-11-24 VITALS — BP 120/71 | HR 87 | Temp 99.0°F | Resp 14 | Ht 69.0 in | Wt 258.0 lb

## 2017-11-24 DIAGNOSIS — R110 Nausea: Secondary | ICD-10-CM

## 2017-11-24 DIAGNOSIS — R51 Headache: Secondary | ICD-10-CM

## 2017-11-24 DIAGNOSIS — R42 Dizziness and giddiness: Secondary | ICD-10-CM

## 2017-11-24 DIAGNOSIS — R519 Headache, unspecified: Secondary | ICD-10-CM

## 2017-11-24 LAB — POCT URINE PREGNANCY: Preg Test, Ur: NEGATIVE

## 2017-11-24 MED ORDER — PROMETHAZINE HCL 25 MG PO TABS: 25.0000 mg | ORAL_TABLET | Freq: Three times a day (TID) | ORAL | 0 refills | Status: DC | PRN

## 2017-11-24 MED ORDER — FLUTICASONE PROPIONATE 50 MCG/ACT NA SUSP: 2.0000 | Freq: Every day | NASAL | 6 refills | Status: DC

## 2017-11-24 MED ORDER — KETOROLAC TROMETHAMINE 60 MG/2ML IM SOLN
60.0000 mg | Freq: Once | INTRAMUSCULAR | Status: AC
  Administered 2017-11-24: 60 mg via INTRAMUSCULAR

## 2017-11-24 MED FILL — PROMETHAZINE 25 MG TABLET: 25 | 6 days supply | Qty: 20 | Fill #0

## 2017-11-24 NOTE — Patient Instructions (Signed)
Nausea, Adult Feeling sick to your stomach (nausea) means that your stomach is upset or you feel like you have to throw up (vomit). Feeling sick to your stomach is usually not serious, but it may be an early sign of a more serious medical problem. As you feel sicker to your stomach, it can lead to throwing up (vomiting). If you throw up, or if you are not able to drink enough fluids, there is a risk of dehydration. Dehydration can make you feel tired and thirsty, have a dry mouth, and pee (urinate) less often. Older adults and people who have other diseases or a weak defense (immune) system have a higher risk of dehydration. The main goal of treating this condition is to:  Limit how often you feel sick to your stomach.  Prevent throwing up and dehydration.  Follow these instructions at home: Follow instructions from your doctor about how to care for yourself at home. Eating and drinking Follow these recommendations as told by your doctor:  Take an oral rehydration solution (ORS). This is a drink that is sold at pharmacies and stores.  Drink clear fluids in small amounts as you are able, such as: ? Water. ? Ice chips. ? Fruit juice that has water added (diluted fruit juice). ? Low-calorie sports drinks.  Eat bland, easy to digest foods in small amounts as you are able, such as: ? Bananas. ? Applesauce. ? Rice. ? Lean meats. ? Toast. ? Crackers.  Avoid drinking fluids that contain a lot of sugar or caffeine.  Avoid alcohol.  Avoid spicy or fatty foods.  General instructions  Drink enough fluid to keep your pee (urine) clear or pale yellow.  Wash your hands often. If you cannot use soap and water, use hand sanitizer.  Make sure that all people in your household wash their hands well and often.  Rest at home while you get better.  Take over-the-counter and prescription medicines only as told by your doctor.  Breathe slowly and deeply when you feel sick to your  stomach.  Watch your condition for any changes.  Keep all follow-up visits as told by your doctor. This is important. Contact a doctor if:  You have a headache.  You have new symptoms.  You feel sicker to your stomach.  You have a fever.  You feel light-headed or dizzy.  You throw up.  You are not able to keep fluids down. Get help right away if:  You have pain in your chest, neck, arm, or jaw.  You feel very weak or you pass out (faint).  You have throw up that is bright red or looks like coffee grounds.  You have bloody or black poop (stools), or poop that looks like tar.  You have a very bad headache, a stiff neck, or both.  You have very bad pain, cramping, or bloating in your belly.  You have a rash.  You have trouble breathing or you are breathing very quickly.  Your heart is beating very quickly.  Your skin feels cold and clammy.  You feel confused.  You have pain while peeing.  You have signs of dehydration, such as: ? Dark pee, or very little or no pee. ? Cracked lips. ? Dry mouth. ? Sunken eyes. ? Sleepiness. ? Weakness. These symptoms may be an emergency. Do not wait to see if the symptoms will go away. Get medical help right away. Call your local emergency services (911 in the [place] Do not drive yourself to   the hospital. This information is not intended to replace advice given to you by your health care provider. Make sure you discuss any questions you have with your health care provider. Document Released: 03/12/2011 Document Revised: 08/29/2015 Document Reviewed: 11/27/2014 Elsevier Interactive Patient Education  2018 [employer]. Sinus Headache A sinus headache happens when your sinuses become clogged or swollen. You may feel pain or pressure in your face, forehead, ears, or upper teeth. Sinus headaches can be mild or severe. Follow these instructions at home:  Take medicines only as told by your doctor.  If you were given an antibiotic  medicine, finish all of it even if you start to feel better.  Use a nose spray if you feel stuffed up (congested).  If told, apply a warm, moist washcloth to your face to help lessen pain. Contact a doctor if:  You get headaches more than one time each week.  Light or sound bothers you.  You have a fever.  You feel sick to your stomach (nauseous) or you throw up (vomit).  Your headaches do not get better with treatment. Get help right away if:  You have trouble seeing.  You suddenly have very bad pain in your face or head.  You start to twitch or shake (seizure).  You are confused.  You have a stiff neck. This information is not intended to replace advice given to you by your health care provider. Make sure you discuss any questions you have with your health care provider. Document Released: 07/23/2010 Document Revised: 11/17/2015 Document Reviewed: 03/19/2014 Elsevier Interactive Patient Education  [employer].

## 2017-11-24 NOTE — Progress Notes (Signed)
Patient [facility]   Progress Note: General Provider: Lanae Boast, FNP  SUBJECTIVE:   Chief Complaint  Patient presents with  . Follow-up    2 week follow up   . Cough  . Nausea     Patient has a past medical history of Allergy, Anemia, Arrhythmia, Chronic headache, Knee pain, Nearsightedness, Obesity, Recurrent boils, and WPW (Wolff-Parkinson-White syndrome). Presents today with cough, nasal congestion and nausea x 3 days. Patient denies dizziness. Endorsed mild weakness after coughing.     Past Medical History:  Diagnosis Date  . Allergy   . Anemia    receives transfusions periodically  . Arrhythmia   . Chronic headache   . Knee pain   . Nearsightedness    wears glasses  . Obesity   . Recurrent boils   . WPW (Wolff-Parkinson-White syndrome)     Social History   Socioeconomic History  . Marital status: Single    Spouse name: Not on file  . Number of children: Not on file  . Years of education: Not on file  . Highest education level: Not on file  Occupational History  . Not on file  Social Needs  . Financial resource strain: Not on file  . Food insecurity:    Worry: Not on file    Inability: Not on file  . Transportation needs:    Medical: Not on file    Non-medical: Not on file  Tobacco Use  . Smoking status: Current Some Day Smoker    Years: 0.50    Types: Cigarettes, Cigars  . Smokeless tobacco: Never Used  . Tobacco comment: smokes black and milds - last use early-mid August  Substance and Sexual Activity  . Alcohol use: No  . Drug use: Yes    Types: Marijuana    Comment: 2+ times per month  . Sexual activity: Yes    Birth control/protection: None, Condom  Lifestyle  . Physical activity:    Days per week: Not on file    Minutes per session: Not on file  . Stress: Not on file  Relationships  . Social connections:    Talks on phone: Not on file    Gets together: Not on file    Attends religious  service: Not on file    Active member of club or organization: Not on file    Attends meetings of clubs or organizations: Not on file    Relationship status: Not on file  . Intimate partner violence:    Fear of current or ex partner: Not on file    Emotionally abused: Not on file    Physically abused: Not on file    Forced sexual activity: Not on file  Other Topics Concern  . Not on file  Social History Narrative  . Not on file      Review of Systems  Constitutional: Negative.   HENT: Positive for congestion.   Respiratory: Positive for cough and sputum production.   Cardiovascular: Negative.   Neurological: Positive for weakness.  Psychiatric/Behavioral: Negative.   All other systems reviewed and are negative.   OBJECTIVE: BP 120/71 (BP Location: Right Arm, Patient Position: Sitting, Cuff Size: Normal)   Pulse 87   Temp 99 F (37.2 C) (Oral)   Resp 14   Ht 5\' 9"  (1.753 m)   Wt 258 lb (117 kg)   LMP 10/27/2017   SpO2 100%   BMI 38.10 kg/m   Physical Exam  Constitutional: She  is oriented to person, place, and time. She appears well-developed and well-nourished. No distress.  HENT:  Head: Normocephalic and atraumatic.  Right Ear: External ear normal.  Left Ear: External ear normal.  Nose: Mucosal edema and rhinorrhea present. Right sinus exhibits no maxillary sinus tenderness and no frontal sinus tenderness. Left sinus exhibits no maxillary sinus tenderness and no frontal sinus tenderness.  Mouth/Throat: Uvula is midline, oropharynx is clear and moist and mucous membranes are normal.  Cardiovascular: Normal rate, regular rhythm and normal heart sounds.  Pulmonary/Chest: Effort normal and breath sounds normal. No respiratory distress.  Neurological: She is alert and oriented to person, place, and time.  Psychiatric: She has a normal mood and affect. Her speech is normal and behavior is normal. Judgment and thought content normal. Cognition and memory are normal.     ASSESSMENT/PLAN:  1. Dizziness  - POCT urine pregnancy - CBC with Differential - TSH - Comprehensive metabolic panel  2. Nausea  - promethazine (PHENERGAN) 25 MG tablet; Take 1 tablet (25 mg total) by mouth every 8 (eight) hours as needed for nausea or vomiting.  Dispense: 20 tablet; Refill: 0  3. Nonintractable headache, unspecified chronicity pattern, unspecified headache type  - ketorolac (TORADOL) injection 60 mg - fluticasone (FLONASE) 50 MCG/ACT nasal spray; Place 2 sprays into both nostrils daily.  Dispense: 16 g; Refill: 6       The patient was given clear instructions to go to ER or return to medical center if symptoms do not improve, worsen or new problems develop. The patient verbalized understanding and agreed with plan of care.   Ms. Doug Sou. Nathaneil Canary, FNP-BC Patient [facility] [address] [place], [place] [address] [phone]     This note has been created with Dragon speech recognition software and smart phrase technology. Any transcriptional errors are unintentional.

## 2017-11-25 LAB — CBC WITH DIFFERENTIAL/PLATELET
Basophils Absolute: 0 10*3/uL (ref 0.0–0.2)
Basos: 0 %
EOS (ABSOLUTE): 0.2 10*3/uL (ref 0.0–0.4)
Eos: 3 %
Hematocrit: 32.9 % — ABNORMAL LOW (ref 34.0–46.6)
Hemoglobin: 9.1 g/dL — ABNORMAL LOW (ref 11.1–15.9)
Immature Grans (Abs): 0 10*3/uL (ref 0.0–0.1)
Immature Granulocytes: 0 %
Lymphocytes Absolute: 3.1 10*3/uL (ref 0.7–3.1)
Lymphs: 39 %
MCH: 20 pg — ABNORMAL LOW (ref 26.6–33.0)
MCHC: 27.7 g/dL — ABNORMAL LOW (ref 31.5–35.7)
MCV: 72 fL — ABNORMAL LOW (ref 79–97)
Monocytes Absolute: 0.4 10*3/uL (ref 0.1–0.9)
Monocytes: 5 %
Neutrophils Absolute: 4.2 10*3/uL (ref 1.4–7.0)
Neutrophils: 53 %
Platelets: 555 10*3/uL — ABNORMAL HIGH (ref 150–450)
RBC: 4.55 x10E6/uL (ref 3.77–5.28)
RDW: 18.6 % — ABNORMAL HIGH (ref 12.3–15.4)
WBC: 7.9 10*3/uL (ref 3.4–10.8)

## 2017-11-25 LAB — COMPREHENSIVE METABOLIC PANEL
ALT: 7 IU/L (ref 0–32)
AST: 9 IU/L (ref 0–40)
Albumin/Globulin Ratio: 0.9 — ABNORMAL LOW (ref 1.2–2.2)
Albumin: 3.6 g/dL (ref 3.5–5.5)
Alkaline Phosphatase: 80 IU/L (ref 39–117)
BUN/Creatinine Ratio: 15 (ref 9–23)
BUN: 13 mg/dL (ref 6–20)
Bilirubin Total: <0.2 mg/dL (ref 0.0–1.2)
CO2: 23 mmol/L (ref 20–29)
Calcium: 8.6 mg/dL — ABNORMAL LOW (ref 8.7–10.2)
Chloride: 105 mmol/L (ref 96–106)
Creatinine, Ser: 0.89 mg/dL (ref 0.57–1.00)
GFR calc Af Amer: 96 mL/min/{1.73_m2} (ref >59)
GFR calc non Af Amer: 84 mL/min/{1.73_m2} (ref >59)
Globulin, Total: 4.2 g/dL (ref 1.5–4.5)
Glucose: 87 mg/dL (ref 65–99)
Potassium: 3.8 mmol/L (ref 3.5–5.2)
Sodium: 141 mmol/L (ref 134–144)
Total Protein: 7.8 g/dL (ref 6.0–8.5)

## 2017-11-25 LAB — TSH: TSH: 1.62 u[IU]/mL (ref 0.450–4.500)

## 2017-12-08 ENCOUNTER — Ambulatory Visit (INDEPENDENT_AMBULATORY_CARE_PROVIDER_SITE_OTHER): Payer: Self-pay | Admitting: Family Medicine

## 2017-12-08 ENCOUNTER — Encounter: Payer: Self-pay | Admitting: Family Medicine

## 2017-12-08 VITALS — BP 124/66 | HR 86 | Temp 99.8°F | Resp 16 | Ht 69.0 in | Wt 257.0 lb

## 2017-12-08 DIAGNOSIS — D649 Anemia, unspecified: Secondary | ICD-10-CM

## 2017-12-08 DIAGNOSIS — R5383 Other fatigue: Secondary | ICD-10-CM

## 2017-12-08 NOTE — Patient Instructions (Signed)
Anemia Anemia is a condition in which you do not have enough red blood cells or hemoglobin. Hemoglobin is a substance in red blood cells that carries oxygen. When you do not have enough red blood cells or hemoglobin (are anemic), your body cannot get enough oxygen and your organs may not work properly. As a result, you may feel very tired or have other problems. What are the causes? Common causes of anemia include:  Excessive bleeding. Anemia can be caused by excessive bleeding inside or outside the body, including bleeding from the intestine or from periods in women.  Poor nutrition.  Long-lasting (chronic) kidney, thyroid, and liver disease.  Bone marrow disorders.  Cancer and treatments for cancer.  HIV (human immunodeficiency virus) and AIDS (acquired immunodeficiency syndrome).  Treatments for HIV and AIDS.  Spleen problems.  Blood disorders.  Infections, medicines, and autoimmune disorders that destroy red blood cells.  What are the signs or symptoms? Symptoms of this condition include:  Minor weakness.  Dizziness.  Headache.  Feeling heartbeats that are irregular or faster than normal (palpitations).  Shortness of breath, especially with exercise.  Paleness.  Cold sensitivity.  Indigestion.  Nausea.  Difficulty sleeping.  Difficulty concentrating.  Symptoms may occur suddenly or develop slowly. If your anemia is mild, you may not have symptoms. How is this diagnosed? This condition is diagnosed based on:  Blood tests.  Your medical history.  A physical exam.  Bone marrow biopsy.  Your health care provider may also check your stool (feces) for blood and may do additional testing to look for the cause of your bleeding. You may also have other tests, including:  Imaging tests, such as a CT scan or MRI.  Endoscopy.  Colonoscopy.  How is this treated? Treatment for this condition depends on the cause. If you continue to lose a lot of blood,  you may need to be treated at a hospital. Treatment may include:  Taking supplements of iron, vitamin B12, or folic acid.  Taking a hormone medicine (erythropoietin) that can help to stimulate red blood cell growth.  Having a blood transfusion. This may be needed if you lose a lot of blood.  Making changes to your diet.  Having surgery to remove your spleen.  Follow these instructions at home:  Take over-the-counter and prescription medicines only as told by your health care provider.  Take supplements only as told by your health care provider.  Follow any diet instructions that you were given.  Keep all follow-up visits as told by your health care provider. This is important. Contact a health care provider if:  You develop new bleeding anywhere in the body. Get help right away if:  You are very weak.  You are short of breath.  You have pain in your abdomen or chest.  You are dizzy or feel faint.  You have trouble concentrating.  You have bloody or black, tarry stools.  You vomit repeatedly or you vomit up blood. Summary  Anemia is a condition in which you do not have enough red blood cells or enough of a substance in your red blood cells that carries oxygen (hemoglobin).  Symptoms may occur suddenly or develop slowly.  If your anemia is mild, you may not have symptoms.  This condition is diagnosed with blood tests as well as a medical history and physical exam. Other tests may be needed.  Treatment for this condition depends on the cause of the anemia. This information is not intended to replace advice   given to you by your health care provider. Make sure you discuss any questions you have with your health care provider. Document Released: 04/30/2004 Document Revised: 04/24/2016 Document Reviewed: 04/24/2016 Elsevier Interactive Patient Education  [employer].

## 2017-12-08 NOTE — Progress Notes (Signed)
  Patient [facility]   Progress Note: General Provider: Lanae Boast, FNP  SUBJECTIVE:   Amanda Davenport is a 37 y.o. female who  has a past medical history of Allergy, Anemia, Arrhythmia, Chronic headache, Knee pain, Nearsightedness, Obesity, Recurrent boils, and WPW (Wolff-Parkinson-White syndrome).. Patient presents today for Follow-up (dizziness ) and Fatigue patient presents for follow up on fatigue. Hx of anemia. States that she has a hx of IV iron infusions and blood transfusion. Last infusion was May 2018.  Patient's last menstrual period was 11/25/2017. States that she has been in the bed for the last 2 days due to fatigue.   Review of Systems  Constitutional: Positive for malaise/fatigue. Negative for fever.  HENT: Negative.   Eyes: Negative.   Respiratory: Positive for shortness of breath.   Cardiovascular: Negative.   Gastrointestinal: Negative.   Genitourinary: Negative.   Musculoskeletal: Negative.   Skin: Negative.   Neurological: Negative.   Endo/Heme/Allergies:       Hx of iron infusions  Psychiatric/Behavioral: Negative.      OBJECTIVE: BP 124/66 (BP Location: Right Arm, Patient Position: Sitting, Cuff Size: Normal)   Pulse 86   Temp 99.8 F (37.7 C) (Oral)   Resp 16   Ht 5\' 9"  (1.753 m)   Wt 257 lb (116.6 kg)   LMP 11/25/2017   SpO2 100%   BMI 37.95 kg/m   Physical Exam  Constitutional: She is oriented to person, place, and time. She appears well-developed and well-nourished. No distress.  HENT:  Head: Normocephalic and atraumatic.  Eyes: Pupils are equal, round, and reactive to light. Conjunctivae and EOM are normal.  Neck: Normal range of motion.  Cardiovascular: Normal rate, regular rhythm, normal heart sounds and intact distal pulses.  Pulmonary/Chest: Effort normal and breath sounds normal. No respiratory distress.  Abdominal: Soft. Bowel sounds are normal. She exhibits no distension.  Musculoskeletal:  Normal range of motion.  Neurological: She is alert and oriented to person, place, and time.  Skin: Skin is warm and dry.  Psychiatric: She has a normal mood and affect. Her behavior is normal. Thought content normal.  Nursing note and vitals reviewed.   ASSESSMENT/PLAN:   1. Anemia, unspecified type  - CBC with Differential - Iron, TIBC and Ferritin Panel Instructed patient to go to the ED if symptoms worsen.        The patient was given clear instructions to go to ER or return to medical center if symptoms do not improve, worsen or new problems develop. The patient verbalized understanding and agreed with plan of care.   Ms. Doug Sou. Nathaneil Canary, FNP-BC Patient [facility] [address] [place], [place] [address] [phone]     This note has been created with Dragon speech recognition software and smart phrase technology. Any transcriptional errors are unintentional.

## 2017-12-09 ENCOUNTER — Other Ambulatory Visit: Payer: Self-pay | Admitting: Family Medicine

## 2017-12-09 ENCOUNTER — Ambulatory Visit (HOSPITAL_COMMUNITY)
Admission: RE | Admit: 2017-12-09 | Discharge: 2017-12-09 | Disposition: A | Discharge status: Home / Self Care | Payer: Self-pay | Source: Ambulatory Visit | Attending: Family Medicine | Admitting: Family Medicine

## 2017-12-09 ENCOUNTER — Telehealth: Payer: Self-pay

## 2017-12-09 ENCOUNTER — Telehealth (HOSPITAL_COMMUNITY): Payer: Self-pay | Admitting: *Deleted

## 2017-12-09 DIAGNOSIS — D509 Iron deficiency anemia, unspecified: Secondary | ICD-10-CM | POA: Insufficient documentation

## 2017-12-09 LAB — IRON,TIBC AND FERRITIN PANEL
Ferritin: 6 ng/mL — ABNORMAL LOW (ref 15–150)
Iron Saturation: 4 % — CL (ref 15–55)
Iron: 12 ug/dL — ABNORMAL LOW (ref 27–159)
Total Iron Binding Capacity: 335 ug/dL (ref 250–450)
UIBC: 323 ug/dL (ref 131–425)

## 2017-12-09 LAB — CBC WITH DIFFERENTIAL/PLATELET
Basophils Absolute: 0.1 10*3/uL (ref 0.0–0.2)
Basos: 1 %
EOS (ABSOLUTE): 0.2 10*3/uL (ref 0.0–0.4)
Eos: 3 %
Hematocrit: 29.3 % — ABNORMAL LOW (ref 34.0–46.6)
Hemoglobin: 8.4 g/dL — ABNORMAL LOW (ref 11.1–15.9)
Immature Grans (Abs): 0 10*3/uL (ref 0.0–0.1)
Immature Granulocytes: 0 %
Lymphocytes Absolute: 2.9 10*3/uL (ref 0.7–3.1)
Lymphs: 35 %
MCH: 19.7 pg — ABNORMAL LOW (ref 26.6–33.0)
MCHC: 28.7 g/dL — ABNORMAL LOW (ref 31.5–35.7)
MCV: 69 fL — ABNORMAL LOW (ref 79–97)
Monocytes Absolute: 0.4 10*3/uL (ref 0.1–0.9)
Monocytes: 5 %
Neutrophils Absolute: 4.6 10*3/uL (ref 1.4–7.0)
Neutrophils: 56 %
Platelets: 531 10*3/uL — ABNORMAL HIGH (ref 150–450)
RBC: 4.27 x10E6/uL (ref 3.77–5.28)
RDW: 19.3 % — ABNORMAL HIGH (ref 12.3–15.4)
WBC: 8.3 10*3/uL (ref 3.4–10.8)

## 2017-12-09 MED ORDER — SODIUM CHLORIDE 0.9 % IV SOLN
INTRAVENOUS | Status: DC | PRN
  Administered 2017-12-09: 250 mL via INTRAVENOUS

## 2017-12-09 MED ORDER — SODIUM CHLORIDE 0.9 % IV SOLN
510.0000 mg | Freq: Once | INTRAVENOUS | Status: AC
  Administered 2017-12-09: 510 mg via INTRAVENOUS
  Filled 2017-12-09: qty 17

## 2017-12-09 NOTE — Telephone Encounter (Signed)
Seth Bake, FNP recommended second dose of iron in one week. Patient called and informed about need for second iron infusion. Transferred patient to scheduler so that appointment could be scheduled.

## 2017-12-09 NOTE — Discharge Instructions (Signed)

## 2017-12-09 NOTE — Telephone Encounter (Signed)
-----   Message from Lanae Boast, [place] sent at 12/09/2017  8:25 AM EDT ----- Please call patient and inform her that she will need to come in for an iron transfusion. Will put in order for feraheme.

## 2017-12-09 NOTE — Progress Notes (Signed)
fer

## 2017-12-09 NOTE — Telephone Encounter (Signed)
Called, no answer. Left a message for patient to call back. Thanks!  

## 2017-12-09 NOTE — Telephone Encounter (Signed)
Called and spoke with patient. Advised of Iron being low and she in scheduled to have iron infusion today 12/09/2017 @10am . Thanks!

## 2017-12-09 NOTE — Progress Notes (Signed)
PATIENT CARE CENTER NOTE  Diagnosis: Iron Deficiency Anemia    Provider: Lanae Boast, FNP   Procedure: IV Feraheme    Note: Patient received Feraheme infusion Tolerated infusion well with no adverse reaction. Monitored patient for 30 minutes post-infusion. Vital signs stable. Discharge instructions given to patient. Patient alert, oriented and ambulatory at discharge.

## 2017-12-16 ENCOUNTER — Ambulatory Visit (HOSPITAL_COMMUNITY)
Admission: RE | Admit: 2017-12-16 | Discharge: 2017-12-16 | Disposition: A | Discharge status: Home / Self Care | Payer: Self-pay | Source: Ambulatory Visit | Attending: Family Medicine | Admitting: Family Medicine

## 2017-12-16 MED ORDER — SODIUM CHLORIDE 0.9 % IV SOLN
INTRAVENOUS | Status: DC | PRN
  Administered 2017-12-16: 250 mL via INTRAVENOUS

## 2017-12-16 MED ORDER — SODIUM CHLORIDE 0.9 % IV SOLN
510.0000 mg | Freq: Once | INTRAVENOUS | Status: AC
  Administered 2017-12-16: 510 mg via INTRAVENOUS
  Filled 2017-12-16: qty 17

## 2017-12-16 MED ORDER — SODIUM CHLORIDE 0.9 % IV SOLN
510.0000 mg | Freq: Once | INTRAVENOUS | Status: DC
  Filled 2017-12-16: qty 17

## 2017-12-16 MED ORDER — SODIUM CHLORIDE 0.9 % IV SOLN: INTRAVENOUS | Status: DC | PRN

## 2017-12-16 NOTE — Progress Notes (Addendum)
Pt received an IV infusion of feraheme, 510 mg, per order; infusion completed with no complications noted  Ordering Provider: Lanae Boast, NP Associated Diagnosis: Iron deficiency anemia

## 2017-12-16 NOTE — Discharge Instructions (Signed)

## 2017-12-17 MED FILL — DOXYCYCLINE HYCLATE 100 MG: 100 | 30 days supply | Qty: 60 | Fill #1

## 2017-12-17 MED FILL — FLUoxetine HCL 20 MG CAPS: 20 | 30 days supply | Qty: 30 | Fill #2

## 2017-12-17 MED FILL — SPIRONOLACTONE 25 MG TABLET: 25 | 30 days supply | Qty: 30 | Fill #3

## 2017-12-20 MED FILL — VIT D2 1.25 MG (50,000 UNIT: 1.25 MG | 28 days supply | Qty: 4 | Fill #0

## 2017-12-20 MED FILL — IBUPROFEN 800 MG TABLET: 800 | 10 days supply | Qty: 40 | Fill #0

## 2017-12-24 MED ORDER — SODIUM CHLORIDE 0.9 % IV SOLN: 510.0000 mg | Freq: Once | INTRAVENOUS | 0 refills | Status: AC

## 2018-01-03 ENCOUNTER — Other Ambulatory Visit: Payer: Self-pay | Admitting: Family Medicine

## 2018-01-03 MED FILL — METOPROLOL TARTRATE 25 MG T: 25 | 30 days supply | Qty: 60 | Fill #0

## 2018-01-03 MED FILL — GABAPENTIN 300 MG CAPSULE: 300 | 30 days supply | Qty: 90 | Fill #2

## 2018-01-13 MED FILL — FLUoxetine HCL 20 MG CAPS: 20 | 30 days supply | Qty: 30 | Fill #3

## 2018-01-13 MED FILL — SPIRONOLACTONE 25 MG TABLET: 25 | 30 days supply | Qty: 30 | Fill #4

## 2018-01-17 ENCOUNTER — Ambulatory Visit: Payer: Self-pay | Admitting: Family Medicine

## 2018-01-21 ENCOUNTER — Other Ambulatory Visit: Payer: Self-pay | Admitting: Family Medicine

## 2018-01-21 DIAGNOSIS — L732 Hidradenitis suppurativa: Secondary | ICD-10-CM

## 2018-01-21 MED FILL — IBUPROFEN 800 MG TABLET: 800 | 10 days supply | Qty: 40 | Fill #0

## 2018-01-21 MED FILL — VIT D2 1.25 MG (50,000 UNIT: 1.25 MG | 28 days supply | Qty: 4 | Fill #1

## 2018-01-21 MED FILL — DOXYCYCLINE HYCLATE 100 MG: 100 | 30 days supply | Qty: 60 | Fill #2

## 2018-01-25 ENCOUNTER — Telehealth: Payer: Self-pay

## 2018-01-25 NOTE — Telephone Encounter (Signed)
Called to verify appointment for 01/27/2018. NO answer left a message of appointment time and if unable to get to call back to reschedule. Thanks!

## 2018-01-27 ENCOUNTER — Ambulatory Visit (INDEPENDENT_AMBULATORY_CARE_PROVIDER_SITE_OTHER): Payer: Self-pay | Admitting: Family Medicine

## 2018-01-27 VITALS — BP 104/60 | HR 72 | Temp 99.2°F | Resp 16 | Ht 69.0 in | Wt 259.0 lb

## 2018-01-27 DIAGNOSIS — Z30011 Encounter for initial prescription of contraceptive pills: Secondary | ICD-10-CM

## 2018-01-27 DIAGNOSIS — D508 Other iron deficiency anemias: Secondary | ICD-10-CM

## 2018-01-27 DIAGNOSIS — I456 Pre-excitation syndrome: Secondary | ICD-10-CM

## 2018-01-27 DIAGNOSIS — L732 Hidradenitis suppurativa: Secondary | ICD-10-CM

## 2018-01-27 LAB — POCT URINE PREGNANCY: Preg Test, Ur: NEGATIVE

## 2018-01-27 MED ORDER — NORGESTIMATE-ETH ESTRADIOL 0.25-35 MG-MCG PO TABS: 1.0000 | ORAL_TABLET | Freq: Every day | ORAL | 11 refills | Status: DC

## 2018-01-27 MED ORDER — TRETINOIN 0.05 % EX CREA: TOPICAL_CREAM | Freq: Every day | CUTANEOUS | 0 refills | Status: DC

## 2018-01-27 NOTE — Progress Notes (Signed)
Patient [facility]   Progress Note: General Provider: Lanae Boast, FNP  SUBJECTIVE:   Amanda Davenport is a 37 y.o. female who  has a past medical history of Allergy, Anemia, Arrhythmia, Chronic headache, Knee pain, Nearsightedness, Obesity, Recurrent boils, and WPW (Wolff-Parkinson-White syndrome).. Patient presents today for Leg Pain (both legs, keeping her up at night ) and Follow-up (hydradinits )  Patient presents with a hx of anemia and WPW syndrome. Patient states that she has several lesions that are open and oozing under the left axilla Review of Systems  Constitutional: Positive for malaise/fatigue.  HENT: Negative.   Eyes: Negative.   Respiratory: Negative.   Cardiovascular: Negative.   Gastrointestinal: Negative.   Genitourinary: Negative.        Heavy menstrual bleeding.    Musculoskeletal: Negative.   Skin: Positive for rash (hx of HS).  Neurological: Negative.   Psychiatric/Behavioral: Negative.      OBJECTIVE: BP 104/60 (BP Location: Right Arm, Patient Position: Sitting, Cuff Size: Normal)   Pulse 72   Temp 99.2 F (37.3 C) (Oral)   Resp 16   Ht 5\' 9"  (1.753 m)   Wt 259 lb (117.5 kg)   LMP 01/08/2018   SpO2 100%   BMI 38.25 kg/m   Physical Exam  Constitutional: She is oriented to person, place, and time. She appears well-developed and well-nourished. No distress.  HENT:  Head: Normocephalic and atraumatic.  Eyes: Pupils are equal, round, and reactive to light. Conjunctivae and EOM are normal.  Neck: Normal range of motion.  Cardiovascular: Normal rate, regular rhythm, normal heart sounds and intact distal pulses.  Pulmonary/Chest: Effort normal and breath sounds normal. No respiratory distress.  Abdominal: Soft. Bowel sounds are normal. She exhibits no distension.  Musculoskeletal: Normal range of motion.  Neurological: She is alert and oriented to person, place, and time.  Skin: Skin is warm and dry. Rash  noted. Rash is pustular (various levels of excoriated pustules noted to the left axilla. mild erythema. ).  Psychiatric: She has a normal mood and affect. Her behavior is normal. Thought content normal.  Nursing note and vitals reviewed.   ASSESSMENT/PLAN:   1. Wolff-Parkinson-White (WPW) syndrome No medication changes warranted. Continue with current treatment and monitoring.   2. Hidradenitis suppurativa Will start trial of retin a cream to help with HS symptoms. Continue with daily doxy.  - tretinoin (RETIN-A) 0.05 % cream; Apply topically at bedtime.  Dispense: 45 g; Refill: 0  3. Other iron deficiency anemia Pending labs. Will adjust medications accordingly.    - US Transvaginal Non-OB; Future - CBC with Differential - Comprehensive metabolic panel - Iron, TIBC and Ferritin Panel - Ambulatory referral to Hematology  4. Encounter for initial prescription of contraceptive pills Patient with heavy menstruation. Has been on OCPs in the past and tolerated well. Also hx of ovarian cysts. Will repeat U/S.  - norgestimate-ethinyl estradiol (SPRINTEC 28) 0.25-35 MG-MCG tablet; Take 1 tablet by mouth daily.  Dispense: 1 Package; Refill: 11 - POCT urine pregnancy        The patient was given clear instructions to go to ER or return to medical center if symptoms do not improve, worsen or new problems develop. The patient verbalized understanding and agreed with plan of care.   Ms. Doug Sou. Nathaneil Canary, FNP-BC Patient [facility] [place], [place] [address] [phone]     This note has been created with Dragon speech  Land. Any transcriptional errors are unintentional.

## 2018-01-27 NOTE — Patient Instructions (Addendum)
You can start your oral birth control pills today. This is known as a quick start method. You will need to continue to use condoms for the next 7 days.  I ordered a transvaginal ultrasound for you. I am referring you to hematology for the anemia     Oral Contraception Information Oral contraceptive pills (OCPs) are medicines taken to prevent pregnancy. OCPs work by preventing the ovaries from releasing eggs. The hormones in OCPs also cause the cervical mucus to thicken, preventing the sperm from entering the uterus. The hormones also cause the uterine lining to become thin, not allowing a fertilized egg to attach to the inside of the uterus. OCPs are highly effective when taken exactly as prescribed. However, OCPs do not prevent sexually transmitted diseases (STDs). Safe sex practices, such as using condoms along with the pill, can help prevent STDs. Before taking the pill, you may have a physical exam and Pap test. Your health care provider may order blood tests. The health care provider will make sure you are a good candidate for oral contraception. Discuss with your health care provider the possible side effects of the OCP you may be prescribed. When starting an OCP, it can take 2 to 3 months for the body to adjust to the changes in hormone levels in your body. Types of oral contraception  The combination pill-This pill contains estrogen and progestin (synthetic progesterone) hormones. The combination pill comes in 21-day, 28-day, or 91-day packs. Some types of combination pills are meant to be taken continuously (365-day pills). With 21-day packs, you do not take pills for 7 days after the last pill. With 28-day packs, the pill is taken every day. The last 7 pills are without hormones. Certain types of pills have more than 21 hormone-containing pills. With 91-day packs, the first 84 pills contain both hormones, and the last 7 pills contain no hormones or contain estrogen only.  The minipill-This pill  contains the progesterone hormone only. The pill is taken every day continuously. It is very important to take the pill at the same time each day. The minipill comes in packs of 28 pills. All 28 pills contain the hormone. Advantages of oral contraceptive pills  Decreases premenstrual symptoms.  Treats menstrual period cramps.  Regulates the menstrual cycle.  Decreases a heavy menstrual flow.  May treatacne, depending on the type of pill.  Treats abnormal uterine bleeding.  Treats polycystic ovarian syndrome.  Treats endometriosis.  Can be used as emergency contraception. Things that can make oral contraceptive pills less effective OCPs can be less effective if:  You forget to take the pill at the same time every day.  You have a stomach or intestinal disease that lessens the absorption of the pill.  You take OCPs with other medicines that make OCPs less effective, such as antibiotics, certain HIV medicines, and some seizure medicines.  You take expired OCPs.  You forget to restart the pill on day 7, when using the packs of 21 pills.  Risks associated with oral contraceptive pills Oral contraceptive pills can sometimes cause side effects, such as:  Headache.  Nausea.  Breast tenderness.  Irregular bleeding or spotting.  Combination pills are also associated with a small increased risk of:  Blood clots.  Heart attack.  Stroke.  This information is not intended to replace advice given to you by your health care provider. Make sure you discuss any questions you have with your health care provider. Document Released: 06/13/2002 Document Revised: 08/29/2015 Document Reviewed:  09/11/2012 Elsevier Interactive Patient Education  [employer].

## 2018-01-28 LAB — IRON,TIBC AND FERRITIN PANEL
Ferritin: 36 ng/mL (ref 15–150)
Iron Saturation: 8 % — CL (ref 15–55)
Iron: 20 ug/dL — ABNORMAL LOW (ref 27–159)
Total Iron Binding Capacity: 260 ug/dL (ref 250–450)
UIBC: 240 ug/dL (ref 131–425)

## 2018-01-28 LAB — COMPREHENSIVE METABOLIC PANEL
ALT: 8 IU/L (ref 0–32)
AST: 13 IU/L (ref 0–40)
Albumin/Globulin Ratio: 0.9 — ABNORMAL LOW (ref 1.2–2.2)
Albumin: 3.8 g/dL (ref 3.5–5.5)
Alkaline Phosphatase: 77 IU/L (ref 39–117)
BUN/Creatinine Ratio: 17 (ref 9–23)
BUN: 15 mg/dL (ref 6–20)
Bilirubin Total: <0.2 mg/dL (ref 0.0–1.2)
CO2: 22 mmol/L (ref 20–29)
Calcium: 8.9 mg/dL (ref 8.7–10.2)
Chloride: 105 mmol/L (ref 96–106)
Creatinine, Ser: 0.89 mg/dL (ref 0.57–1.00)
GFR calc Af Amer: 96 mL/min/{1.73_m2} (ref >59)
GFR calc non Af Amer: 84 mL/min/{1.73_m2} (ref >59)
Globulin, Total: 4.1 g/dL (ref 1.5–4.5)
Glucose: 43 mg/dL — ABNORMAL LOW (ref 65–99)
Potassium: 4.1 mmol/L (ref 3.5–5.2)
Sodium: 140 mmol/L (ref 134–144)
Total Protein: 7.9 g/dL (ref 6.0–8.5)

## 2018-01-28 LAB — CBC WITH DIFFERENTIAL/PLATELET
Basophils Absolute: 0.1 10*3/uL (ref 0.0–0.2)
Basos: 1 %
EOS (ABSOLUTE): 0.2 10*3/uL (ref 0.0–0.4)
Eos: 2 %
Hematocrit: 37.6 % (ref 34.0–46.6)
Hemoglobin: 11.1 g/dL (ref 11.1–15.9)
Immature Grans (Abs): 0 10*3/uL (ref 0.0–0.1)
Immature Granulocytes: 0 %
Lymphocytes Absolute: 3.4 10*3/uL — ABNORMAL HIGH (ref 0.7–3.1)
Lymphs: 37 %
MCH: 24 pg — ABNORMAL LOW (ref 26.6–33.0)
MCHC: 29.5 g/dL — ABNORMAL LOW (ref 31.5–35.7)
MCV: 81 fL (ref 79–97)
Monocytes Absolute: 0.6 10*3/uL (ref 0.1–0.9)
Monocytes: 7 %
Neutrophils Absolute: 4.8 10*3/uL (ref 1.4–7.0)
Neutrophils: 53 %
Platelets: 428 10*3/uL (ref 150–450)
RBC: 4.62 x10E6/uL (ref 3.77–5.28)
WBC: 9 10*3/uL (ref 3.4–10.8)

## 2018-01-28 MED FILL — MONO-LINYAH 28 TABLET: 0.25-35 | 28 days supply | Qty: 28 | Fill #0

## 2018-01-31 ENCOUNTER — Telehealth: Payer: Self-pay

## 2018-01-31 NOTE — Telephone Encounter (Signed)
Called and spoke with patient, advised that we will need to have her come in for 2 fereheme infusions 3 days apart. She states she can not come in tomorrow. So we have scheduled her first infusion for Wednesday 02/02/2018 and the 2nd will be scheduled for Monday 02/07/18. Patient verbalized understanding. Thanks!

## 2018-01-31 NOTE — Telephone Encounter (Signed)
-----   Message from Lanae Boast, [place] sent at 01/30/2018  6:35 AM EDT ----- Lorelee Market needs to come in for 2 infusions for fereheme. They need to be 3 days apart and then repeat labs in 1 month.

## 2018-02-01 ENCOUNTER — Ambulatory Visit (HOSPITAL_COMMUNITY)
Admission: RE | Admit: 2018-02-01 | Discharge: 2018-02-01 | Disposition: A | Discharge status: Home / Self Care | Payer: Self-pay | Source: Ambulatory Visit | Attending: Family Medicine | Admitting: Family Medicine

## 2018-02-01 DIAGNOSIS — D508 Other iron deficiency anemias: Secondary | ICD-10-CM | POA: Insufficient documentation

## 2018-02-01 DIAGNOSIS — N83291 Other ovarian cyst, right side: Secondary | ICD-10-CM | POA: Insufficient documentation

## 2018-02-01 DIAGNOSIS — N920 Excessive and frequent menstruation with regular cycle: Secondary | ICD-10-CM | POA: Insufficient documentation

## 2018-02-02 ENCOUNTER — Ambulatory Visit (HOSPITAL_COMMUNITY)
Admission: RE | Admit: 2018-02-02 | Discharge: 2018-02-02 | Disposition: A | Discharge status: Home / Self Care | Payer: Self-pay | Source: Ambulatory Visit | Attending: Family Medicine | Admitting: Family Medicine

## 2018-02-02 DIAGNOSIS — D509 Iron deficiency anemia, unspecified: Secondary | ICD-10-CM | POA: Insufficient documentation

## 2018-02-02 MED ORDER — SODIUM CHLORIDE 0.9 % IV SOLN
INTRAVENOUS | Status: DC | PRN
  Administered 2018-02-02: 250 mL via INTRAVENOUS

## 2018-02-02 MED ORDER — SODIUM CHLORIDE 0.9 % IV SOLN
510.0000 mg | Freq: Once | INTRAVENOUS | Status: AC
  Administered 2018-02-02: 510 mg via INTRAVENOUS
  Filled 2018-02-02: qty 17

## 2018-02-02 NOTE — Discharge Instructions (Signed)

## 2018-02-02 NOTE — Progress Notes (Signed)
Patient received Feraheme via a PIV as ordered. Observed for at least 30 minutes post infusion.Tolerated well, vitals stable, discharge instructions given, verbalized understanding. Patient alert, oriented and ambulatory at the time of discharge.  

## 2018-02-03 ENCOUNTER — Other Ambulatory Visit: Payer: Self-pay | Admitting: Family Medicine

## 2018-02-03 MED FILL — GABAPENTIN 300 MG CAPSULE: 300 | 30 days supply | Qty: 90 | Fill #3

## 2018-02-04 MED FILL — METOPROLOL TARTRATE 25 MG T: 25 | 30 days supply | Qty: 60 | Fill #0

## 2018-02-07 ENCOUNTER — Ambulatory Visit (HOSPITAL_COMMUNITY)
Admission: RE | Admit: 2018-02-07 | Discharge: 2018-02-07 | Disposition: A | Discharge status: Home / Self Care | Payer: Self-pay | Source: Ambulatory Visit | Attending: Family Medicine | Admitting: Family Medicine

## 2018-02-07 DIAGNOSIS — E611 Iron deficiency: Secondary | ICD-10-CM | POA: Insufficient documentation

## 2018-02-07 MED ORDER — SODIUM CHLORIDE 0.9 % IV SOLN
510.0000 mg | Freq: Once | INTRAVENOUS | Status: AC
  Administered 2018-02-07: 510 mg via INTRAVENOUS
  Filled 2018-02-07: qty 17

## 2018-02-07 MED ORDER — SODIUM CHLORIDE 0.9 % IV SOLN
INTRAVENOUS | Status: DC | PRN
  Administered 2018-02-07: 250 mL via INTRAVENOUS

## 2018-02-07 NOTE — Discharge Instructions (Signed)

## 2018-02-07 NOTE — Progress Notes (Signed)
PATIENT CARE CENTER NOTE  Diagnosis: Iron Deficiency Anemia    Provider: Lanae Boast, FNP   Procedure: IV Feraheme   Note: Patient received infusion of Feraheme. Monitored patient for 30 minutes post infusion. Tolerated infusion well with no adverse reaction. Vital signs stable. Discharge instructions given. Patient alert, oriented and ambulatory at discharge.

## 2018-02-15 ENCOUNTER — Encounter: Payer: Self-pay | Admitting: Family Medicine

## 2018-03-13 ENCOUNTER — Other Ambulatory Visit: Payer: Self-pay

## 2018-03-13 ENCOUNTER — Emergency Department (HOSPITAL_COMMUNITY)
Admission: EM | Admit: 2018-03-13 | Discharge: 2018-03-13 | Disposition: A | Discharge status: Home / Self Care | Payer: Self-pay | Attending: Emergency Medicine | Admitting: Emergency Medicine

## 2018-03-13 ENCOUNTER — Encounter (HOSPITAL_COMMUNITY): Payer: Self-pay | Admitting: Emergency Medicine

## 2018-03-13 DIAGNOSIS — Z79899 Other long term (current) drug therapy: Secondary | ICD-10-CM | POA: Insufficient documentation

## 2018-03-13 DIAGNOSIS — F17210 Nicotine dependence, cigarettes, uncomplicated: Secondary | ICD-10-CM | POA: Insufficient documentation

## 2018-03-13 DIAGNOSIS — L989 Disorder of the skin and subcutaneous tissue, unspecified: Secondary | ICD-10-CM | POA: Insufficient documentation

## 2018-03-13 HISTORY — DX: Polyneuropathy, unspecified: G62.9

## 2018-03-13 LAB — URINALYSIS, ROUTINE W REFLEX MICROSCOPIC
Bilirubin Urine: NEGATIVE
Glucose, UA: NEGATIVE mg/dL
Ketones, ur: NEGATIVE mg/dL
Nitrite: NEGATIVE
Protein, ur: NEGATIVE mg/dL
Specific Gravity, Urine: 1.023 (ref 1.005–1.030)
pH: 5 (ref 5.0–8.0)

## 2018-03-13 LAB — BASIC METABOLIC PANEL
ANION GAP: 9 (ref 5–15)
BUN: 11 mg/dL (ref 6–20)
CALCIUM: 8.8 mg/dL — AB (ref 8.9–10.3)
CO2: 25 mmol/L (ref 22–32)
CREATININE: 0.86 mg/dL (ref 0.44–1.00)
Chloride: 106 mmol/L (ref 98–111)
GFR calc Af Amer: >60 mL/min (ref >60)
Glucose, Bld: 88 mg/dL (ref 70–99)
Potassium: 3.6 mmol/L (ref 3.5–5.1)
Sodium: 140 mmol/L (ref 135–145)

## 2018-03-13 LAB — CBC WITH DIFFERENTIAL/PLATELET
ABS IMMATURE GRANULOCYTES: 0.05 10*3/uL (ref 0.00–0.07)
BASOS PCT: 1 %
Basophils Absolute: 0.1 10*3/uL (ref 0.0–0.1)
Eosinophils Absolute: 0.1 10*3/uL (ref 0.0–0.5)
Eosinophils Relative: 1 %
HEMATOCRIT: 36.2 % (ref 36.0–46.0)
Hemoglobin: 11.4 g/dL — ABNORMAL LOW (ref 12.0–15.0)
IMMATURE GRANULOCYTES: 0 %
LYMPHS PCT: 28 %
Lymphs Abs: 3.2 10*3/uL (ref 0.7–4.0)
MCH: 28.5 pg (ref 26.0–34.0)
MCHC: 31.5 g/dL (ref 30.0–36.0)
MCV: 90.5 fL (ref 80.0–100.0)
Monocytes Absolute: 1 10*3/uL (ref 0.1–1.0)
Monocytes Relative: 9 %
NEUTROS ABS: 7.1 10*3/uL (ref 1.7–7.7)
NEUTROS PCT: 61 %
NRBC: 0 % (ref 0.0–0.2)
Platelets: 341 10*3/uL (ref 150–400)
RBC: 4 MIL/uL (ref 3.87–5.11)
RDW: 19.7 % — AB (ref 11.5–15.5)
WBC: 11.6 10*3/uL — AB (ref 4.0–10.5)

## 2018-03-13 LAB — I-STAT CG4 LACTIC ACID, ED: LACTIC ACID, VENOUS: 0.8 mmol/L (ref 0.5–1.9)

## 2018-03-13 NOTE — Discharge Instructions (Addendum)
You were seen in the ER for fever, tender lesions to your skin.  No signs of infection in your urine.  No signs of sepsis or blood infection in your lab work.    The source of your lesions is unclear.  Follow-up with your primary care doctor in 1 week if these lesions are not improving.  Return to the ER for worsening or more lesions throughout your body, inside your mouth, persistent fever, chest pain, shortness of breath, vomiting, diarrhea, urinary symptoms.  The source of your low-grade fever may be from your hidradenitis especially because you recently stopped taking her doxycycline.  Follow-up with your primary care doctor for further discussion of your hidradenitis symptoms not improving on the medications you are on.

## 2018-03-13 NOTE — ED Triage Notes (Signed)
Pt c/o left leg pain x week. Pt reports right leg started hurting yesterday. Pt started getting bumps on fingers and face that started yesterday as well as neck stiffness.

## 2018-03-13 NOTE — ED Provider Notes (Signed)
[facility] DEPT Provider Note   CSN: [id number] Arrival date & time: 03/13/18  1510     History   Chief Complaint Chief Complaint  Patient presents with  . Leg Pain  . Rash  . Neck Pain    HPI Amanda Davenport is a 37 y.o. 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  Sudden onset yesterday morning.  She woke up to go to work and felt overall poorly and hot, fever of 100.8.  She then noticed painful, red, warm pustules to her body involving fingers, arms, legs, feet and around her hairline.  Pain is described like a burn.  Other associated symptoms include nausea.  Reports that her legs have also been hurting, admits her neuropathy usually causes her bilateral leg pain but the pain in the last couple of days has felt different.  The pain in her legs feel like a stiffness and she cannot fully extend her legs, left greater than right.  The pain is worse with movement and weightbearing.  She has been compliant with all her medications however 1 week ago she stopped taking her suppressive dose of doxycycline because she felt like it was not helping her at bedtime and was only making her nauseous and drowsy.  States her at bedtime has been worsening slowly over several months, she has not noticed significant worsening drainage in the last week since she stopped her doxycycline.  No known sick contacts with similar symptoms.  No new medication changes.  No lesions to her mouth or mucous membranes.  She denies other infectious symptoms such as congestion, rhinorrhea, cough, vomiting, diarrhea, dysuria, hematuria, chest pain or shortness of breath.  No IV drug use. HPI  Past Medical History:  Diagnosis Date  . Allergy   . Anemia    receives transfusions periodically  . Arrhythmia   . Chronic headache   . Knee pain   .  Nearsightedness    wears glasses  . Neuropathy   . Obesity   . Recurrent boils   . WPW (Wolff-Parkinson-White syndrome)     Patient Active Problem List   Diagnosis Date Noted  . Facial cellulitis   . Otitis externa   . Dental abscess 11/27/2016  . 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 Absolute anemia 05/21/2015  . Neck strain 12/11/2014  . Hydradenitis 12/11/2014  . Dental caries 12/11/2014  . Constipation 12/03/2014  . Neck pain 12/03/2014  . Myalgia and myositis 11/27/2014  . 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 Morbid obesity ([place]) 11/27/2014  . Hematochezia 12/16/2012  . Wolff-Parkinson-White (WPW) syndrome 03/01/2012  . Iron deficiency anemia 02/16/2012  . Tachycardia 01/28/2012  . Hidradenitis suppurativa 08/18/2011  . AXILLARY ABSCESS 12/30/2006  . ANKLE PAIN 10/11/2006    Past Surgical History:  Procedure Laterality Date  . TONSILLECTOMY       OB History    Gravida  0   Para      Term      Preterm      AB      Living        SAB      TAB      Ectopic      Multiple      Live Births               Home Medications    Prior to Admission medications  Medication Sig Start Date End Date Taking? Authorizing Provider  acetaminophen (TYLENOL) 325 MG tablet Take 2 tablets (650 mg total) by mouth every 6 (six) hours as needed for mild pain or headache (or Fever >/= 101). Patient not taking: Reported on 01/27/2018 11/29/16   Barton Dubois, MD  bacitracin 500 UNIT/GM ointment Apply 1 application topically 2 (two) times daily. 11/10/17   Lanae Boast, FNP  chlorhexidine (HIBICLENS) 4 % external liquid Apply topically daily as needed. Patient not taking: Reported on 09/23/2017 06/23/17   [name], FNP  clindamycin (CLINDAGEL) 1 % gel Apply topically 2 (two) times daily. Patient not taking: Reported on 01/27/2018 06/23/17   [name], FNP  doxycycline (VIBRA-TABS) 100 MG tablet Take 1 tablet (100 mg total) by mouth 2 (two)  times daily. 10/11/17   Carlyle Basques, MD  ferrous sulfate (SLOW FE) 160 (50 Fe) MG TBCR SR tablet Take 1 tablet (160 mg total) by mouth daily. 11/17/17   Lanae Boast, FNP  FLUoxetine (PROZAC) 20 MG tablet Take 1 tablet (20 mg total) by mouth daily. 09/23/17   [name], FNP  fluticasone (FLONASE) 50 MCG/ACT nasal spray Place 2 sprays into both nostrils daily. 11/24/17   Lanae Boast, FNP  gabapentin (NEURONTIN) 300 MG capsule TAKE 1 CAPSULE (300 MG TOTAL) BY MOUTH 3 (THREE) TIMES DAILY. 10/04/17   [name], FNP  HYDROcodone-acetaminophen (NORCO/VICODIN) 5-325 MG tablet Take 1-2 tablets by mouth every 6 (six) hours as needed for severe pain. Patient not taking: Reported on 01/27/2018 05/30/17   Frederica Kuster, PA-C  ibuprofen (ADVIL,MOTRIN) 800 MG tablet TAKE 1 TABLET BY MOUTH EVERY 6 HOURS AS NEEDED FOR FEVER OR HEADACHE (PAIN). 01/21/18   Lanae Boast, FNP  metoprolol tartrate (LOPRESSOR) 25 MG tablet TAKE 1 TABLET BY MOUTH 2 TIMES DAILY. 02/04/18   Lanae Boast, FNP  norgestimate-ethinyl estradiol (SPRINTEC 28) 0.25-35 MG-MCG tablet Take 1 tablet by mouth daily. 01/27/18   Lanae Boast, FNP  promethazine (PHENERGAN) 25 MG tablet Take 1 tablet (25 mg total) by mouth every 8 (eight) hours as needed for nausea or vomiting. 11/24/17   Lanae Boast, FNP  tretinoin (RETIN-A) 0.05 % cream Apply topically at bedtime. 01/27/18   Lanae Boast, FNP    Family History Family History  Problem Relation Age of Onset  . Breast cancer Mother   . Pulmonary embolism Mother        died of PE  . Colon cancer Mother   . Irritable bowel syndrome Mother   . Cancer Mother   . Hypertension Father   . Diabetes Paternal Grandmother   . Heart disease Neg Hx   . Stroke Neg Hx     Social History Social History   Tobacco Use  . Smoking status: Current Some Day Smoker    Years: 0.50    Types: Cigarettes, Cigars  . Smokeless tobacco: Never Used  . Tobacco comment: smokes black and  milds - last use early-mid August  Substance Use Topics  . Alcohol use: No  . Drug use: Yes    Types: Marijuana    Comment: 2+ times per month     Allergies   Other; Penicillins; Shellfish-derived products; Shrimp [shellfish allergy]; and Sulfa antibiotics   Review of Systems Review of Systems  Constitutional: Positive for fever.  Gastrointestinal: Positive for nausea.  Skin:       Painful, red lesions Chronic malodorous drainage, sinus tracts to bilateral axilla left greater than right  All other systems  reviewed and are negative.    Physical Exam Updated Vital Signs BP (!) 97/59   Pulse 93   Temp 99.4 F (37.4 C) (Oral)   Resp 16   Ht 5\' 9"  (1.753 m)   Wt 117 kg   LMP 02/27/2018   SpO2 98%   BMI 38.10 kg/m   Physical Exam  Constitutional: She is oriented to person, place, and time. She appears well-developed and well-nourished.  Non toxic  HENT:  Head: Normocephalic and atraumatic.  Nose: Nose normal.  No intraoral lesions.  Oropharynx and tonsils normal.  No mucosal edema, erythema, rhinorrhea.  Eyes: Pupils are equal, round, and reactive to light. Conjunctivae and EOM are normal.  Neck: Normal range of motion.  Cardiovascular: Normal rate and regular rhythm.  Diffuse tenderness to lower extremities including thighs, bilateral calves.  1+ DP pulses bilaterally.  No lower extremity asymmetric edema or erythema.  Pulmonary/Chest: Effort normal and breath sounds normal.  Abdominal: Soft. Bowel sounds are normal. There is no tenderness.  No G/R/R. No suprapubic or CVA tenderness. Negative Murphy's and McBurney's. Active BS to lower quadrants.   Musculoskeletal: Normal range of motion.  Neurological: She is alert and oriented to person, place, and time.  Skin: Skin is warm and dry. Capillary refill takes less than 2 seconds. Lesion and rash noted. Rash is papular and pustular.  Tender, erythematous lesions to left index finger, right index finger, right bicep,  multiple to bilateral lower extremities and left toe.  Some of these lesions are pustular and tender lesions to fingers, left toe and right bicep are pustular and tender the rest are nontender and papular.   Right axilla with scant malodorous drainage, multiple sinus tracts.  Left axilla with moderate, very malodorous drainage and more extensive sinus tracts, there is diffuse tenderness here.  Psychiatric: She has a normal mood and affect. Her behavior is normal.  Nursing note and vitals reviewed.    ED Treatments / Results  Labs (all labs ordered are listed, but only abnormal results are displayed) Labs Reviewed  CBC WITH DIFFERENTIAL/PLATELET - Abnormal; Notable for the following components:      Result Value   WBC 11.6 (*)    Hemoglobin 11.4 (*)    RDW 19.7 (*)    All other components within normal limits  BASIC METABOLIC PANEL - Abnormal; Notable for the following components:   Calcium 8.8 (*)    All other components within normal limits  URINALYSIS, ROUTINE W REFLEX MICROSCOPIC - Abnormal; Notable for the following components:   Hgb urine dipstick MODERATE (*)    Leukocytes, UA SMALL (*)    Bacteria, UA RARE (*)    All other components within normal limits  URINE CULTURE  I-STAT CG4 LACTIC ACID, ED  I-STAT CG4 LACTIC ACID, ED    EKG None  Radiology No results found.  Procedures Procedures (including critical care time)  Medications Ordered in ED Medications - No data to display   Initial Impression / Assessment and Plan / ED Course  I have reviewed the triage vital signs and the nursing notes.  Pertinent labs & imaging results that were available during my care of the patient were reviewed by me and considered in my medical decision making (see chart for details).  Clinical Course as of Mar 13 1946  Nancy Fetter Mar 13, 2018  1836 Hgb urine dipstickMarland Kitchen): MODERATE [CG]  1836 Leukocytes, UA(!): SMALL [CG]  1836 WBC, UA: 11-20 [CG]  1836 Bacteria, UA(!): RARE [CG]  Clinical Course User Index [CG] Kinnie Feil, PA-C   37 year old is here for fever.  She has history of at bedtime and 1 week ago stopped taking her suppressive dose of doxycycline however she denies significant, acute worsening of her at bedtime to the left axilla.  This seems to be worsening slowly and gradually for several weeks and not responding to Doxy which is why she stopped it.  The fever could be from HS. additionally, she has tender and nontender pustular/papular lesions.  These do not look consistent with Janeway lesions and she has no history of IV drug use.  She has no splinter hemorrhages.  She has no chest pain, shortness of breath.  She waxes her legs, last wax was 2 weeks ago so nontender, papular lesions to her legs could be mild folliculitis.  No other infectious symptoms reported.  Given reported fever and initial elevated HR we will obtain screening labs.  1942: WBC 11.6, lactic acid normal, HR normalized.  No SIRS criteria. UA with moderate hgb, small leuks, 11-20 WBC, rare bacteria which could be UTI however pt has no symptoms, suprapubic or CVAT.  We decided to defer abx today and await culture. She is comfortable with this.  She had a 100.78F once, and afebrile today on arrival without antipyretics.  Fever may be from viral process vs hidradenitis since she stopped her suppressive doxycyline 1 week ago. There is no abscess to axilla today amenable to I&D.  Lesions of unclear etiology but I doubt dermatological emergency such as SJS, syphilis, anaphylaxis.  She is deemed appropriate for discharge with symptomatic management. Return precautions given. Discussed with Dr Alvino Chapel.  Final Clinical Impressions(s) / ED Diagnoses   Final diagnoses:  Skin lesions    ED Discharge Orders    None       Arlean Hopping 03/13/18 1947    Davonna Belling, MD 03/14/18 [id number]

## 2018-03-15 LAB — URINE CULTURE

## 2018-03-22 ENCOUNTER — Other Ambulatory Visit: Payer: Self-pay | Admitting: Family Medicine

## 2018-03-22 DIAGNOSIS — L732 Hidradenitis suppurativa: Secondary | ICD-10-CM

## 2018-04-29 ENCOUNTER — Encounter: Payer: Self-pay | Admitting: Family Medicine

## 2018-04-29 ENCOUNTER — Ambulatory Visit (INDEPENDENT_AMBULATORY_CARE_PROVIDER_SITE_OTHER): Payer: Self-pay | Admitting: Family Medicine

## 2018-04-29 VITALS — BP 110/73 | HR 84 | Temp 99.0°F | Resp 16 | Ht 69.0 in | Wt 269.0 lb

## 2018-04-29 DIAGNOSIS — Z202 Contact with and (suspected) exposure to infections with a predominantly sexual mode of transmission: Secondary | ICD-10-CM

## 2018-04-29 DIAGNOSIS — N83209 Unspecified ovarian cyst, unspecified side: Secondary | ICD-10-CM | POA: Insufficient documentation

## 2018-04-29 DIAGNOSIS — D508 Other iron deficiency anemias: Secondary | ICD-10-CM

## 2018-04-29 DIAGNOSIS — N83201 Unspecified ovarian cyst, right side: Secondary | ICD-10-CM

## 2018-04-29 NOTE — Patient Instructions (Signed)
Iron Deficiency Anemia, Adult  Iron-deficiency anemia is when you have a low amount of red blood cells or hemoglobin. This happens because you have too little iron in your body. Hemoglobin carries oxygen to parts of the body. Anemia can cause your body to not get enough oxygen. It may or may not cause symptoms.  Follow these instructions at home:  Medicines  · Take over-the-counter and prescription medicines only as told by your doctor. This includes iron pills (supplements) and vitamins.  · If you cannot handle taking iron pills by mouth, ask your doctor about getting iron through:  ? A vein (intravenously).  ? A shot (injection) into a muscle.  · Take iron pills when your stomach is empty. If you cannot handle this, take them with food.  · Do not drink milk or take antacids at the same time as your iron pills.  · To prevent trouble pooping (constipation), eat fiber or take medicine (stool softener) as told by your doctor.  Eating and drinking    · Talk with your doctor before changing the foods you eat. He or she may tell you to eat foods that have a lot of iron, such as:  ? Liver.  ? Lowfat (lean) beef.  ? Breads and cereals that have iron added to them (fortified breads and cereals).  ? Eggs.  ? Dried fruit.  ? Dark green, leafy vegetables.  · Drink enough fluid to keep your pee (urine) clear or pale yellow.  · Eat fresh fruits and vegetables that are high in vitamin C. They help your body to use iron. Foods with a lot of vitamin C include:  ? Oranges.  ? Peppers.  ? Tomatoes.  ? Mangoes.  General instructions  · Return to your normal activities as told by your doctor. Ask your doctor what activities are safe for you.  · Keep yourself clean, and keep things clean around you (your surroundings). Anemia can make you get sick more easily.  · Keep all follow-up visits as told by your doctor. This is important.  Contact a doctor if:  · You feel sick to your stomach (nauseous).  · You throw up (vomit).  · You feel  weak.  · You are sweating for no clear reason.  · You have trouble pooping, such as:  ? Pooping (having a bowel movement) less than 3 times a week.  ? Straining to poop.  ? Having poop that is hard, dry, or larger than normal.  ? Feeling full or bloated.  ? Pain in the lower belly.  ? Not feeling better after pooping.  Get help right away if:  · You pass out (faint). If this happens, do not drive yourself to the hospital. Call your local emergency services (911 in the [place]  · You have chest pain.  · You have shortness of breath that:  ? Is very bad.  ? Gets worse with physical activity.  · You have a fast heartbeat.  · You get light-headed when getting up from sitting or lying down.  This information is not intended to replace advice given to you by your health care provider. Make sure you discuss any questions you have with your health care provider.  Document Released: 04/25/2010 Document Revised: 12/11/2015 Document Reviewed: 12/11/2015  Elsevier Interactive Patient Education © 2019 [employer]

## 2018-04-29 NOTE — Progress Notes (Signed)
Patient [facility]   Progress Note: General Provider: Lanae Boast, FNP  SUBJECTIVE:   Amanda Davenport is a 38 y.o. female who  has a past medical history of Allergy, Anemia, Arrhythmia, Chronic headache, Knee pain, Nearsightedness, Neuropathy, Obesity, Recurrent boils, and WPW (Wolff-Parkinson-White syndrome).. Patient presents today for Follow-up (hidradenitits ) and Exposure to STD (wants to be tested for std ) Patient presents for routine follow-up.  She states that she would like to be tested for STIs today.  She denies vaginal discharge, abdominal pain, vaginal irritation or rash. Also history of iron deficient anemia.  Past history of receiving iron infusions.  Would like levels checked today.   Review of Systems  Constitutional: Negative.   HENT: Negative.   Eyes: Negative.   Respiratory: Negative.   Cardiovascular: Negative.   Gastrointestinal: Negative.   Genitourinary: Negative.   Musculoskeletal: Negative.   Skin: Negative.   Neurological: Negative.   Psychiatric/Behavioral: Negative.      OBJECTIVE: BP 110/73 (BP Location: Right Arm, Patient Position: Sitting, Cuff Size: Normal)   Pulse 84   Temp 99 F (37.2 C) (Oral)   Resp 16   Ht 5\' 9"  (1.753 m)   Wt 269 lb (122 kg)   LMP 04/18/2018   SpO2 100%   BMI 39.72 kg/m   Wt Readings from Last 3 Encounters:  04/29/18 269 lb (122 kg)  03/13/18 258 lb (117 kg)  01/27/18 259 lb (117.5 kg)     Physical Exam Vitals signs and nursing note reviewed.  Constitutional:      General: She is not in acute distress.    Appearance: She is well-developed.  HENT:     Head: Normocephalic and atraumatic.  Eyes:     Conjunctiva/sclera: Conjunctivae normal.     Pupils: Pupils are equal, round, and reactive to light.  Neck:     Musculoskeletal: Normal range of motion.  Cardiovascular:     Rate and Rhythm: Normal rate and regular rhythm.     Heart sounds: Normal heart sounds.    Pulmonary:     Effort: Pulmonary effort is normal. No respiratory distress.     Breath sounds: Normal breath sounds.  Abdominal:     General: Bowel sounds are normal. There is no distension.     Palpations: Abdomen is soft.  Musculoskeletal: Normal range of motion.  Skin:    General: Skin is warm and dry.  Neurological:     Mental Status: She is alert and oriented to person, place, and time.  Psychiatric:        Behavior: Behavior normal.        Thought Content: Thought content normal.     ASSESSMENT/PLAN:   1. Exposure to STD Patient denies symptoms today.  Labs ordered - Chlamydia/GC NAA, Confirmation - HIV antibody (with reflex) - RPR  2. Other iron deficiency anemia Pending labs. Will adjust medications accordingly.    - CBC with Differential - Iron, TIBC and Ferritin Panel   3. Cyst of right ovary Reviewed ultrasound results with patient.  Right ovarian cyst has decreased in size.  Recommend to follow-up in 1 year for repeat ultrasound.  Return in about 3 months (around 07/29/2018) for anemia.    The patient was given clear instructions to go to ER or return to medical center if symptoms do not improve, worsen or new problems develop. The patient verbalized understanding and agreed with plan of care.   Ms. Doug Sou. Nathaneil Canary, FNP-BC  Patient [facility] [address] [place], [place] [address] [phone]

## 2018-04-30 LAB — CBC WITH DIFFERENTIAL/PLATELET
Basophils Absolute: 0.1 10*3/uL (ref 0.0–0.2)
Basos: 1 %
EOS (ABSOLUTE): 0.2 10*3/uL (ref 0.0–0.4)
Eos: 2 %
Hematocrit: 35.9 % (ref 34.0–46.6)
Hemoglobin: 11.5 g/dL (ref 11.1–15.9)
Immature Grans (Abs): 0 10*3/uL (ref 0.0–0.1)
Immature Granulocytes: 0 %
Lymphocytes Absolute: 3.2 10*3/uL — ABNORMAL HIGH (ref 0.7–3.1)
Lymphs: 39 %
MCH: 28.5 pg (ref 26.6–33.0)
MCHC: 32 g/dL (ref 31.5–35.7)
MCV: 89 fL (ref 79–97)
Monocytes Absolute: 0.3 10*3/uL (ref 0.1–0.9)
Monocytes: 4 %
Neutrophils Absolute: 4.5 10*3/uL (ref 1.4–7.0)
Neutrophils: 54 %
Platelets: 440 10*3/uL (ref 150–450)
RBC: 4.04 x10E6/uL (ref 3.77–5.28)
RDW: 14.9 % (ref 11.7–15.4)
WBC: 8.3 10*3/uL (ref 3.4–10.8)

## 2018-04-30 LAB — IRON,TIBC AND FERRITIN PANEL
Ferritin: 142 ng/mL (ref 15–150)
Iron Saturation: 12 % — ABNORMAL LOW (ref 15–55)
Iron: 26 ug/dL — ABNORMAL LOW (ref 27–159)
Total Iron Binding Capacity: 222 ug/dL — ABNORMAL LOW (ref 250–450)
UIBC: 196 ug/dL (ref 131–425)

## 2018-04-30 LAB — RPR: RPR Ser Ql: NONREACTIVE

## 2018-04-30 LAB — HIV ANTIBODY (ROUTINE TESTING W REFLEX): HIV Screen 4th Generation wRfx: NONREACTIVE

## 2018-05-04 LAB — CHLAMYDIA/GC NAA, CONFIRMATION
Chlamydia trachomatis, NAA: NEGATIVE
Neisseria gonorrhoeae, NAA: NEGATIVE

## 2018-05-10 ENCOUNTER — Emergency Department (HOSPITAL_COMMUNITY)
Admission: EM | Admit: 2018-05-10 | Discharge: 2018-05-10 | Disposition: A | Discharge status: Home / Self Care | Payer: Self-pay | Attending: Emergency Medicine | Admitting: Emergency Medicine

## 2018-05-10 ENCOUNTER — Encounter (HOSPITAL_COMMUNITY): Payer: Self-pay

## 2018-05-10 ENCOUNTER — Emergency Department (HOSPITAL_COMMUNITY): Payer: Self-pay

## 2018-05-10 ENCOUNTER — Other Ambulatory Visit: Payer: Self-pay

## 2018-05-10 DIAGNOSIS — F17290 Nicotine dependence, other tobacco product, uncomplicated: Secondary | ICD-10-CM | POA: Insufficient documentation

## 2018-05-10 DIAGNOSIS — M792 Neuralgia and neuritis, unspecified: Secondary | ICD-10-CM | POA: Insufficient documentation

## 2018-05-10 DIAGNOSIS — F17210 Nicotine dependence, cigarettes, uncomplicated: Secondary | ICD-10-CM | POA: Insufficient documentation

## 2018-05-10 DIAGNOSIS — Z79899 Other long term (current) drug therapy: Secondary | ICD-10-CM | POA: Insufficient documentation

## 2018-05-10 DIAGNOSIS — L732 Hidradenitis suppurativa: Secondary | ICD-10-CM | POA: Insufficient documentation

## 2018-05-10 DIAGNOSIS — R0789 Other chest pain: Secondary | ICD-10-CM | POA: Insufficient documentation

## 2018-05-10 LAB — BASIC METABOLIC PANEL
Anion gap: 8 (ref 5–15)
BUN: 12 mg/dL (ref 6–20)
CHLORIDE: 106 mmol/L (ref 98–111)
CO2: 24 mmol/L (ref 22–32)
Calcium: 9 mg/dL (ref 8.9–10.3)
Creatinine, Ser: 0.79 mg/dL (ref 0.44–1.00)
GFR calc Af Amer: >60 mL/min (ref >60)
GFR calc non Af Amer: >60 mL/min (ref >60)
Glucose, Bld: 84 mg/dL (ref 70–99)
Potassium: 3.7 mmol/L (ref 3.5–5.1)
Sodium: 138 mmol/L (ref 135–145)

## 2018-05-10 LAB — I-STAT BETA HCG BLOOD, ED (NOT ORDERABLE)

## 2018-05-10 LAB — CBC
HEMATOCRIT: 37.8 % (ref 36.0–46.0)
Hemoglobin: 11.9 g/dL — ABNORMAL LOW (ref 12.0–15.0)
MCH: 29 pg (ref 26.0–34.0)
MCHC: 31.5 g/dL (ref 30.0–36.0)
MCV: 92 fL (ref 80.0–100.0)
Platelets: 394 10*3/uL (ref 150–400)
RBC: 4.11 MIL/uL (ref 3.87–5.11)
RDW: 15.5 % (ref 11.5–15.5)
WBC: 8.7 10*3/uL (ref 4.0–10.5)
nRBC: 0 % (ref 0.0–0.2)

## 2018-05-10 LAB — POCT I-STAT TROPONIN I: Troponin i, poc: 0.01 ng/mL (ref 0.00–0.08)

## 2018-05-10 LAB — D-DIMER, QUANTITATIVE: D-Dimer, Quant: 1.25 ug/mL-FEU — ABNORMAL HIGH (ref 0.00–0.50)

## 2018-05-10 MED ORDER — IOPAMIDOL (ISOVUE-370) INJECTION 76%
INTRAVENOUS | Status: AC
  Filled 2018-05-10: qty 100

## 2018-05-10 MED ORDER — SODIUM CHLORIDE (PF) 0.9 % IJ SOLN
INTRAMUSCULAR | Status: AC
  Filled 2018-05-10: qty 50

## 2018-05-10 MED ORDER — CLINDAMYCIN HCL 300 MG PO CAPS: 300.0000 mg | ORAL_CAPSULE | Freq: Four times a day (QID) | ORAL | 0 refills | Status: AC

## 2018-05-10 MED ORDER — METOPROLOL TARTRATE 25 MG PO TABS: 25.0000 mg | ORAL_TABLET | Freq: Two times a day (BID) | ORAL | 0 refills | Status: DC

## 2018-05-10 MED ORDER — ACETAMINOPHEN 500 MG PO TABS
1000.0000 mg | ORAL_TABLET | Freq: Once | ORAL | Status: AC
  Administered 2018-05-10: 1000 mg via ORAL
  Filled 2018-05-10: qty 2

## 2018-05-10 MED ORDER — SODIUM CHLORIDE 0.9% FLUSH: 3.0000 mL | Freq: Once | INTRAVENOUS | Status: DC

## 2018-05-10 MED ORDER — IOPAMIDOL (ISOVUE-370) INJECTION 76%
100.0000 mL | Freq: Once | INTRAVENOUS | Status: AC | PRN
  Administered 2018-05-10: 100 mL via INTRAVENOUS

## 2018-05-10 NOTE — ED Provider Notes (Signed)
[facility] DEPT Provider Note   CSN: [id number] Arrival date & time: 05/10/18  1128     History   Chief Complaint Chief Complaint  Patient presents with  . Chest Pain    HPI Amanda Davenport is a 38 y.o. female.  Amanda Davenport is a 38 y.o. 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 Pt reports a dull ache over the left upper chest radiating into the axilla and down the left arm. Pt also reports some mild discomfort over the left posterior neck. Pain has been presents for 2 day, first noted after pt woke from sleep. Pain is not worse with exertion, is worse with movement of the left arm and worse on inspiration. No asociated fevers. No N/V or diaphoresis. No abdominal pain. No shortness fo breath. No lightheadedness or syncope. Pt has not taken anything to treat pain, no other aggravating or alleviating factors. Pt has known hx of WPW and was previously followed by Dr. Lovena Le with EP who started pt on metoprolol 25 MG BID, pt has not followed up over the past few years, and 1 week ago ran out of her beta blocker. Pt also reports severe hydradenitis, primarily present in the left axilla, and pt wonders if this could be causing herpain, she reports several draining areas. pt reports she was previously on doxycycline until 2 wks ago, and did not feel that this helped significantly. Pt reports she had ben told she needs surgery for this, was seeifn someone at [facility] who referred her to [facility] but has not heard anything back regardign this refferal.      Past Medical History:  Diagnosis Date  . Allergy   . Anemia    receives transfusions periodically  . Arrhythmia   . Chronic headache   . Knee pain   . Nearsightedness    wears glasses  . Neuropathy   . Obesity   . Recurrent boils   . WPW (Wolff-Parkinson-White syndrome)     Patient Active Problem List   Diagnosis Date Noted  . Ovarian cyst 04/29/2018  . Facial cellulitis   . Otitis externa   . Dental abscess 11/27/2016  . 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 Absolute anemia 05/21/2015  . Neck strain 12/11/2014  . Hydradenitis 12/11/2014  . Dental caries 12/11/2014  . Constipation 12/03/2014  . Neck pain 12/03/2014  . Myalgia and myositis 11/27/2014  . 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 Morbid obesity ([place]) 11/27/2014  . Hematochezia 12/16/2012  . Wolff-Parkinson-White (WPW) syndrome 03/01/2012  . Iron deficiency anemia 02/16/2012  . Tachycardia 01/28/2012  . Hidradenitis suppurativa 08/18/2011  . AXILLARY ABSCESS 12/30/2006  . ANKLE PAIN 10/11/2006    Past Surgical History:  Procedure Laterality Date  . TONSILLECTOMY       OB History    Gravida  0   Para      Term      Preterm      AB      Living        SAB      TAB      Ectopic      Multiple      Live Births               Home Medications    Prior to Admission medications   Medication Sig Start Date End Date Taking? Authorizing Provider  acetaminophen (  TYLENOL) 325 MG tablet Take 2 tablets (650 mg total) by mouth every 6 (six) hours as needed for mild pain or headache (or Fever >/= 101). 11/29/16  Yes Barton Dubois, MD  ibuprofen (ADVIL,MOTRIN) 200 MG tablet Take 400 mg by mouth every 6 (six) hours as needed for headache, mild pain or moderate pain.   Yes [provider]  bacitracin 500 UNIT/GM ointment Apply 1 application topically 2 (two) times daily. Patient not taking: Reported on 04/29/2018 11/10/17   Lanae Boast, FNP  chlorhexidine (HIBICLENS) 4 % external liquid Apply topically daily as needed. Patient not taking: Reported on 09/23/2017 06/23/17   [name], FNP  clindamycin (CLINDAGEL) 1 % gel Apply topically 2 (two) times daily. Patient not taking: Reported on 01/27/2018 06/23/17   [name], FNP  doxycycline (VIBRA-TABS) 100 MG tablet Take 1 tablet  (100 mg total) by mouth 2 (two) times daily. Patient not taking: Reported on 04/29/2018 10/11/17   Carlyle Basques, MD  ferrous sulfate (SLOW FE) 160 (50 Fe) MG TBCR SR tablet Take 1 tablet (160 mg total) by mouth daily. Patient not taking: Reported on 04/29/2018 11/17/17   Lanae Boast, FNP  FLUoxetine (PROZAC) 20 MG tablet Take 1 tablet (20 mg total) by mouth daily. Patient not taking: Reported on 04/29/2018 09/23/17   [name], FNP  fluticasone [facility]) 50 MCG/ACT nasal spray Place 2 sprays into both nostrils daily. Patient not taking: Reported on 04/29/2018 11/24/17   Lanae Boast, FNP  gabapentin (NEURONTIN) 300 MG capsule TAKE 1 CAPSULE (300 MG TOTAL) BY MOUTH 3 (THREE) TIMES DAILY. Patient not taking: Reported on 04/29/2018 10/04/17   [name], FNP  HYDROcodone-acetaminophen (NORCO/VICODIN) 5-325 MG tablet Take 1-2 tablets by mouth every 6 (six) hours as needed for severe pain. Patient not taking: Reported on 01/27/2018 05/30/17   Frederica Kuster, PA-C  ibuprofen (ADVIL,MOTRIN) 800 MG tablet TAKE 1 TABLET BY MOUTH EVERY 6 HOURS AS NEEDED FOR FEVER OR HEADACHE (PAIN). Patient not taking: Reported on 04/29/2018 03/22/18   Lanae Boast, FNP  metoprolol tartrate (LOPRESSOR) 25 MG tablet TAKE 1 TABLET BY MOUTH 2 TIMES DAILY. Patient not taking: Reported on 04/29/2018 02/04/18   Lanae Boast, FNP  norgestimate-ethinyl estradiol ([place] 28) 0.25-35 MG-MCG tablet Take 1 tablet by mouth daily. Patient not taking: Reported on 04/29/2018 01/27/18   Lanae Boast, FNP  promethazine (PHENERGAN) 25 MG tablet Take 1 tablet (25 mg total) by mouth every 8 (eight) hours as needed for nausea or vomiting. Patient not taking: Reported on 04/29/2018 11/24/17   Lanae Boast, FNP  tretinoin (RETIN-A) 0.05 % cream Apply topically at bedtime. Patient not taking: Reported on 04/29/2018 01/27/18   Lanae Boast, FNP    Family History Family History  Problem Relation Age of Onset  . Breast cancer  Mother   . Pulmonary embolism Mother        died of PE  . Colon cancer Mother   . Irritable bowel syndrome Mother   . Cancer Mother   . Hypertension Father   . Diabetes Paternal Grandmother   . Heart disease Neg Hx   . Stroke Neg Hx     Social History Social History   Tobacco Use  . Smoking status: Current Some Day Smoker    Years: 0.50    Types: Cigarettes, Cigars  . Smokeless tobacco: Never Used  . Tobacco comment: smokes black and milds - last use early-mid August  Substance Use Topics  . Alcohol use: No  .  Drug use: Yes    Types: Marijuana    Comment: 2+ times per month     Allergies   Other; Penicillins; Shellfish-derived products; Shrimp [shellfish allergy]; and Sulfa antibiotics   Review of Systems Review of Systems  Constitutional: Negative for chills and fever.  HENT: Negative.   Eyes: Negative for visual disturbance.  Respiratory: Negative for cough, chest tightness, shortness of breath and wheezing.   Cardiovascular: Positive for chest pain.  Gastrointestinal: Negative for abdominal pain, nausea and vomiting.  Genitourinary: Negative for dysuria and frequency.  Musculoskeletal: Positive for arthralgias, myalgias and neck pain. Negative for back pain and neck stiffness.  Skin: Positive for color change. Negative for rash.       Multiple draining areas in left axilla  Neurological: Negative for dizziness, facial asymmetry, weakness, light-headedness, numbness and headaches.     Physical Exam Updated Vital Signs BP 121/85 (BP Location: Right Arm)   Pulse 96   Temp 98.3 F (36.8 C) (Oral)   Resp 20   Ht 5\' 9"  (1.753 m)   Wt 117.9 kg   LMP 04/18/2018   SpO2 99%   BMI 38.40 kg/m   Physical Exam Vitals signs and nursing note reviewed.  Constitutional:      General: She is not in acute distress.    Appearance: She is well-developed. She is obese. She is not ill-appearing, toxic-appearing or diaphoretic.  HENT:     Head: Normocephalic and  atraumatic.  Eyes:     General:        Right eye: No discharge.        Left eye: No discharge.     Pupils: Pupils are equal, round, and reactive to light.  Neck:     Musculoskeletal: Normal range of motion and neck supple.     Comments: There is some tenderness along the left trapezius muscle, no erythema or swelling, no palpable deformity, no midline C-spine tednerness Cardiovascular:     Rate and Rhythm: Normal rate and regular rhythm.     Pulses:          Radial pulses are 2+ on the right side and 2+ on the left side.       Dorsalis pedis pulses are 2+ on the right side and 2+ on the left side.     Heart sounds: Normal heart sounds. No murmur. No friction rub. No gallop.   Pulmonary:     Effort: Pulmonary effort is normal. No respiratory distress.     Breath sounds: Normal breath sounds. No wheezing or rales.     Comments: Respirations equal and unlabored, patient able to speak in full sentences, lungs clear to auscultation bilaterally, pain over the left upper chest with inspiration and some tenderness on palpation Abdominal:     General: Bowel sounds are normal. There is no distension.     Palpations: Abdomen is soft. There is no mass.     Tenderness: There is no abdominal tenderness. There is no guarding.     Comments: Abdomen soft, nondistended, nontender to palpation in all quadrants without guarding or peritoneal signs  Musculoskeletal:        General: No deformity.     Right lower leg: She exhibits no tenderness. No edema.     Left lower leg: She exhibits no tenderness. No edema.  Skin:    General: Skin is warm and dry.     Capillary Refill: Capillary refill takes less than 2 seconds.     Comments: Left axilla with  multiple areas of induration and several open and draining tracts as pictured below, there is no clear abscess, but the entire axilla appears chronically inflamed.  Neurological:     Mental Status: She is alert.     Coordination: Coordination normal.      Comments: Speech is clear, able to follow commands Moves extremities without ataxia, coordination intact   Psychiatric:        Mood and Affect: Mood normal.        Behavior: Behavior normal.          ED Treatments / Results  Labs (all labs ordered are listed, but only abnormal results are displayed) Labs Reviewed  CBC - Abnormal; Notable for the following components:      Result Value   Hemoglobin 11.9 (*)    All other components within normal limits  D-DIMER, QUANTITATIVE (NOT AT [facility]) - Abnormal; Notable for the following components:   D-Dimer, Quant 1.25 (*)    All other components within normal limits  BASIC METABOLIC PANEL  I-STAT TROPONIN, ED  I-STAT BETA HCG BLOOD, ED ([facility], [facility], [facility] ONLY)  I-STAT BETA HCG BLOOD, ED (NOT ORDERABLE)  POCT I-STAT TROPONIN I    EKG EKG Interpretation  Date/Time:  Tuesday May 10 2018 11:44:33 EST Ventricular Rate:  98 PR Interval:    QRS Duration: 121 QT Interval:  392 QTC Calculation: 501 R Axis:   0 Text Interpretation:  Sinus rhythm Ventricular preexcitation(WPW) Confirmed by Lennice Sites [id number]) on 05/10/2018 3:46:42 PM   Radiology Dg Chest 2 View  Result Date: 05/10/2018 CLINICAL DATA:  Chest pain. EXAM: CHEST - 2 VIEW COMPARISON:  05/30/2017.  11/14/2016.  08/16/2013. FINDINGS: Fullness noted of the anterior mediastinum on lateral view is most likely secondary overlying soft tissues. No interim change from multiple prior exams. Hilar structures normal. Cardiomegaly with normal pulmonary vascularity. Lungs are clear. No pleural effusion pneumothorax. No acute bony abnormality. IMPRESSION: No acute cardiopulmonary disease. Electronically Signed   By: Marcello Moores  Register   On: 05/10/2018 12:18   Ct Angio Chest Pe W And/or Wo Contrast  Result Date: 05/10/2018 CLINICAL DATA:  Left-sided chest pain radiating down the left arm for 2 days EXAM: CT ANGIOGRAPHY CHEST WITH CONTRAST TECHNIQUE: Multidetector CT imaging of the chest was  performed using the standard protocol during bolus administration of intravenous contrast. Multiplanar CT image reconstructions and MIPs were obtained to evaluate the vascular anatomy. CONTRAST:  161mL ISOVUE-370 IOPAMIDOL (ISOVUE-370) INJECTION 76% COMPARISON:  None. FINDINGS: Cardiovascular: Satisfactory opacification of the pulmonary arteries to the segmental level. No evidence of pulmonary embolism. Normal heart size. No pericardial effusion. Mediastinum/Nodes: No enlarged mediastinal, hilar, or axillary lymph nodes. Thyroid gland, trachea, and esophagus demonstrate no significant findings. Lungs/Pleura: Lungs are clear. No pleural effusion or pneumothorax. Upper Abdomen: No acute abnormality. Musculoskeletal: No acute osseous abnormality. No aggressive osseous lesion. Review of the MIP images confirms the above findings. IMPRESSION: 1. No evidence pulmonary embolus. 2. No acute cardiopulmonary disease. Electronically Signed   By: Kathreen Devoid   On: 05/10/2018 19:26    Procedures Procedures (including critical care time)  Medications Ordered in ED Medications  sodium chloride flush (NS) 0.9 % injection 3 mL (3 mLs Intravenous Not Given 05/10/18 1731)  sodium chloride (PF) 0.9 % injection (has no administration in time range)  iopamidol (ISOVUE-370) 76 % injection (has no administration in time range)  acetaminophen (TYLENOL) tablet 1,000 mg (1,000 mg Oral Given 05/10/18 1756)  iopamidol (ISOVUE-370) 76 % injection 100 mL (  100 mLs Intravenous Contrast Given 05/10/18 1857)     Initial Impression / Assessment and Plan / ED Course  I have reviewed the triage vital signs and the nursing notes.  Pertinent labs & imaging results that were available during my care of the patient were reviewed by me and considered in my medical decision making (see chart for details).  38 yo F presents with 2 days left upper chest pain, non exertional, is somewhat pleuritic, but also worsened by movement of the left  shoudler. Pt with severe hydraadenitis of left axilla, with several draining areas as pictured above which I think could be contributing to her dicomfort. Also with known WPW, has not followed up with [facility] in years, ran out of BB 1 week ago. Pt with some pain in the left neck radiation into shoulder and uper chest, suggesting possible radicular caue, no recent neck injury or trauma, LUE neurovascularly intact. I feel CP is less likely cardiac given story, and EKG w/o ischemic chnages and troponin is negative. CXR without acute cardiopulmonary disease. Pt is low risk for PE aside from pleritc pain, will check D-Dimer. Stable hgb, no leukocytosis, no acute electrolyte derrangments.   D-dimer elevared, will proceed with CT angio of the chest. Pain improved with tx.  CTA negative for PE or other acute abnormalityl. Pt currently CP free. DIscussed results with pt, feel pain is likely a combo of hydradenitis flar and radicular pain causing symptoms. Hydradenitis is chronic and pt has been told she likely needs surgery for this due to severity, given no leukocytosis or fevers, and no clear abscess do not feel that I & D would be of benefit. Will change pt to clindamycin and refer back to [facility]. Discussed pt's EKG and medication with On-call PA Renee for Dr. Rayann Heman with EP, who recommends continuing metoprolol, and will have Dr. Gwyneth Revels scheduler reach out to patient for follow up. Return precautions discussed, pt expresses understanding and agreement. DIschrged home in good condition.    Final Clinical Impressions(s) / ED Diagnoses   Final diagnoses:  Atypical chest pain  Hidradenitis suppurativa  Radicular pain in left arm    ED Discharge Orders         Ordered    metoprolol tartrate (LOPRESSOR) 25 MG tablet  2 times daily     05/10/18 1958    clindamycin (CLEOCIN) 300 MG capsule  4 times daily     05/10/18 1958           Jacqlyn Larsen, [place] 05/13/18 1430    Lennice Sites, DO 05/16/18  1501

## 2018-05-10 NOTE — ED Notes (Signed)
EDPA Provider at bedside. 

## 2018-05-10 NOTE — Discharge Instructions (Signed)
Your work-up today is reassuring and does not suggest an acute problem with your heart or lungs is causing your symptoms.  Since the pain starts in your neck and goes down into your shoulder and arm this may be due to nerve inflammation, continue using ibuprofen, Tylenol, ice and heat.  I also think that the inflammation from your hidradenitis could be contributing as well, please begin taking clindamycin make sure you are keeping area clean and dry since you were seen by [facility] for this previously would like for you to follow-up with them again and please follow-up with the patient care center as well.  Return to the emergency department if you have fevers, worsening chest pain, shortness of breath or any other new or concerning symptoms.

## 2018-05-10 NOTE — ED Notes (Signed)
Patient transported to CT 

## 2018-05-10 NOTE — ED Triage Notes (Signed)
patient c/o left chest pain that radiates down the left arm. Patient denies any SOB.

## 2018-05-10 NOTE — ED Notes (Signed)
Bed: [facility] Expected date:  Expected time:  Means of arrival:  Comments: Hold for [employer]

## 2018-05-18 ENCOUNTER — Telehealth: Payer: Self-pay

## 2018-05-18 NOTE — Telephone Encounter (Signed)
Amanda Davenport, Can you please advise on lab results. Thanks!

## 2018-05-19 NOTE — Telephone Encounter (Signed)
Called, no answer and no voicemail set up. Thanks!

## 2018-05-19 NOTE — Telephone Encounter (Signed)
There is a note with result comments that have instructions regarding her lab results on 05/04/2018

## 2018-05-19 NOTE — Telephone Encounter (Signed)
Called, no answer and no voicemail set up. It looks like there was a message sent to her via my chart. Also results are under labs from that day. Thanks!

## 2018-05-20 ENCOUNTER — Telehealth: Payer: Self-pay

## 2018-05-20 ENCOUNTER — Other Ambulatory Visit: Payer: Self-pay | Admitting: Family Medicine

## 2018-05-20 NOTE — Progress Notes (Signed)
Orders placed for fereheme.

## 2018-05-20 NOTE — Telephone Encounter (Signed)
Called to speak with patient regarding coming in for transfusion. NO answer and voicemail not set up.

## 2018-05-20 NOTE — Telephone Encounter (Signed)
Ordered fereheme for patient. Please call to schedule.

## 2018-05-20 NOTE — Telephone Encounter (Signed)
Called and left a message that we have put in a order for fereheme infusion. Advised we can scheduled for 05/25/2018 @8am  and if she can not make this to please call as soon as possible to rescheduled. Thanks!

## 2018-05-20 NOTE — Telephone Encounter (Signed)
Patient called and I spoke with her about previous lab results and that they have improved and she needs to take otc iron and we will continue to monitor. Patient states she doesn't like taking otc iron and has started to have bad migraines the last week. She states that when this happens her iron levels have usually dropped and she gets an infusion. She is asking if you can place orders to have her labs rechecked? Please advise. Thanks!

## 2018-05-25 ENCOUNTER — Ambulatory Visit (HOSPITAL_COMMUNITY)
Admission: RE | Admit: 2018-05-25 | Discharge: 2018-05-25 | Disposition: A | Discharge status: Home / Self Care | Payer: Self-pay | Source: Ambulatory Visit | Attending: Family Medicine | Admitting: Family Medicine

## 2018-05-25 DIAGNOSIS — E611 Iron deficiency: Secondary | ICD-10-CM | POA: Insufficient documentation

## 2018-05-25 MED ORDER — SODIUM CHLORIDE 0.9 % IV SOLN
510.0000 mg | INTRAVENOUS | Status: DC
  Administered 2018-05-25: 510 mg via INTRAVENOUS
  Filled 2018-05-25: qty 17

## 2018-05-25 MED ORDER — SODIUM CHLORIDE 0.9 % IV SOLN
INTRAVENOUS | Status: DC | PRN
  Administered 2018-05-25: 250 mL via INTRAVENOUS

## 2018-05-25 NOTE — Discharge Instructions (Signed)

## 2018-05-25 NOTE — Progress Notes (Signed)
Patient received Feraheme  via PIV as ordered. Observed for at least 30 minutes post infusion.Tolerated well, vitals stable, discharge instructions given, verbalized understanding. Patient alert, oriented and ambulatory at the time of discharge.  

## 2018-06-02 ENCOUNTER — Ambulatory Visit: Payer: Self-pay | Admitting: Internal Medicine

## 2018-06-03 ENCOUNTER — Encounter: Payer: Self-pay | Admitting: Internal Medicine

## 2018-06-13 ENCOUNTER — Emergency Department (HOSPITAL_COMMUNITY): Payer: Self-pay

## 2018-06-13 ENCOUNTER — Emergency Department (HOSPITAL_COMMUNITY)
Admission: EM | Admit: 2018-06-13 | Discharge: 2018-06-13 | Disposition: A | Discharge status: Home / Self Care | Payer: Self-pay | Attending: Emergency Medicine | Admitting: Emergency Medicine

## 2018-06-13 ENCOUNTER — Encounter (HOSPITAL_COMMUNITY): Payer: Self-pay | Admitting: Emergency Medicine

## 2018-06-13 ENCOUNTER — Other Ambulatory Visit: Payer: Self-pay

## 2018-06-13 DIAGNOSIS — M25571 Pain in right ankle and joints of right foot: Secondary | ICD-10-CM | POA: Insufficient documentation

## 2018-06-13 DIAGNOSIS — F17210 Nicotine dependence, cigarettes, uncomplicated: Secondary | ICD-10-CM | POA: Insufficient documentation

## 2018-06-13 DIAGNOSIS — I456 Pre-excitation syndrome: Secondary | ICD-10-CM | POA: Insufficient documentation

## 2018-06-13 MED ORDER — ONDANSETRON 4 MG PO TBDP
4.0000 mg | ORAL_TABLET | Freq: Once | ORAL | Status: AC
  Administered 2018-06-13: 4 mg via ORAL
  Filled 2018-06-13: qty 1

## 2018-06-13 MED ORDER — NAPROXEN 500 MG PO TABS: 500.0000 mg | ORAL_TABLET | Freq: Two times a day (BID) | ORAL | 0 refills | Status: DC | PRN

## 2018-06-13 MED ORDER — NAPROXEN 500 MG PO TABS
500.0000 mg | ORAL_TABLET | Freq: Once | ORAL | Status: AC
  Administered 2018-06-13: 500 mg via ORAL
  Filled 2018-06-13: qty 1

## 2018-06-13 MED ORDER — DIPHENHYDRAMINE HCL 25 MG PO CAPS
25.0000 mg | ORAL_CAPSULE | Freq: Once | ORAL | Status: AC
  Administered 2018-06-13: 25 mg via ORAL
  Filled 2018-06-13: qty 1

## 2018-06-13 MED ORDER — HYDROCODONE-ACETAMINOPHEN 5-325 MG PO TABS
1.0000 | ORAL_TABLET | Freq: Once | ORAL | Status: AC
  Administered 2018-06-13: 1 via ORAL
  Filled 2018-06-13: qty 1

## 2018-06-13 NOTE — Discharge Instructions (Signed)
It was my pleasure taking care of you today!   Call the orthopedic doctor listed today or tomorrow to schedule a follow-up appointment.  Take naproxen twice daily as needed for pain.  You can also take Tylenol over-the-counter if you need further pain control.  Ice the ankle several times a day (instructions below). Rest and stay off the foot as much as possible.  Return to the emergency department for new or worsening symptoms, any additional concerns.  COLD THERAPY DIRECTIONS:  Ice or gel packs can be used to reduce both pain and swelling. Ice is the most helpful within the first 24 to 48 hours after an injury or flareup from overusing a muscle or joint.  Ice is effective, has very few side effects, and is safe for most people to use.   If you expose your skin to cold temperatures for too long or without the proper protection, you can damage your skin or nerves. Watch for signs of skin damage due to cold.   HOME CARE INSTRUCTIONS  Follow these tips to use ice and cold packs safely.  Place a dry or damp towel between the ice and skin. A damp towel will cool the skin more quickly, so you may need to shorten the time that the ice is used.  For a more rapid response, add gentle compression to the ice.  Ice for no more than 10 to 20 minutes at a time. The bonier the area you are icing, the less time it will take to get the benefits of ice.  Check your skin after 5 minutes to make sure there are no signs of a poor response to cold or skin damage.  Rest 20 minutes or more in between uses.  Once your skin is numb, you can end your treatment. You can test numbness by very lightly touching your skin. The touch should be so light that you do not see the skin dimple from the pressure of your fingertip. When using ice, most people will feel these normal sensations in this order: cold, burning, aching, and numbness.

## 2018-06-13 NOTE — ED Triage Notes (Addendum)
Pt complaint of right ankle pain and swelling onset 3-4 days ago; swelling to right medial ankle. Denies injury.

## 2018-06-13 NOTE — ED Provider Notes (Signed)
[facility] DEPT Provider Note   CSN: [id number] Arrival date & time: 06/13/18  1006    History   Chief Complaint Chief Complaint  Patient presents with  . Ankle Pain    HPI Amanda Davenport is a 38 y.o. female.     The history is provided by the patient and medical records. No language interpreter was used.  Ankle Pain   Amanda Davenport is a 38 y.o. female  with a PMH as listed below who presents to the Emergency Department complaining of constant, persistent right ankle pain which began about 4 days ago.  Associated with swelling to the medial ankle.  She denies any known injury or trauma.  Pain is worse with ambulation.  She has a history of neuropathy, so sometimes does have to walk with a cane when this flares up, therefore has been using her cane to help her ambulate over the last few days.  She feels safe doing this.  Denies any fever, redness or wounds.  Denies any calf pain or tenderness.  No chest pain or shortness of breath.   Past Medical History:  Diagnosis Date  . Allergy   . Anemia    receives transfusions periodically  . Arrhythmia   . Chronic headache   . Knee pain   . Nearsightedness    wears glasses  . Neuropathy   . Obesity   . Recurrent boils   . WPW (Wolff-Parkinson-White syndrome)     Patient Active Problem List   Diagnosis Date Noted  . Ovarian cyst 04/29/2018  . Facial cellulitis   . Otitis externa   . Dental abscess 11/27/2016  . 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 Absolute anemia 05/21/2015  . Neck strain 12/11/2014  . Hydradenitis 12/11/2014  . Dental caries 12/11/2014  . Constipation 12/03/2014  . Neck pain 12/03/2014  . Myalgia and myositis 11/27/2014  . 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 Morbid obesity ([place]) 11/27/2014  . Hematochezia 12/16/2012  . Wolff-Parkinson-White (WPW) syndrome 03/01/2012  . Iron deficiency anemia 02/16/2012  . Tachycardia 01/28/2012  . Hidradenitis  suppurativa 08/18/2011  . AXILLARY ABSCESS 12/30/2006  . ANKLE PAIN 10/11/2006    Past Surgical History:  Procedure Laterality Date  . TONSILLECTOMY       OB History    Gravida  0   Para      Term      Preterm      AB      Living        SAB      TAB      Ectopic      Multiple      Live Births               Home Medications    Prior to Admission medications   Medication Sig Start Date End Date Taking? Authorizing Provider  acetaminophen (TYLENOL) 325 MG tablet Take 2 tablets (650 mg total) by mouth every 6 (six) hours as needed for mild pain or headache (or Fever >/= 101). 11/29/16   Barton Dubois, MD  bacitracin 500 UNIT/GM ointment Apply 1 application topically 2 (two) times daily. Patient not taking: Reported on 04/29/2018 11/10/17   Lanae Boast, FNP  chlorhexidine (HIBICLENS) 4 % external liquid Apply topically daily as needed. Patient not taking: Reported on 09/23/2017 06/23/17   [name], FNP  clindamycin (CLINDAGEL) 1 % gel Apply topically 2 (two) times daily. Patient not taking: Reported on 01/27/2018 06/23/17  [name], FNP  doxycycline (VIBRA-TABS) 100 MG tablet Take 1 tablet (100 mg total) by mouth 2 (two) times daily. Patient not taking: Reported on 04/29/2018 10/11/17   Carlyle Basques, MD  ferrous sulfate (SLOW FE) 160 (50 Fe) MG TBCR SR tablet Take 1 tablet (160 mg total) by mouth daily. Patient not taking: Reported on 04/29/2018 11/17/17   Lanae Boast, FNP  FLUoxetine (PROZAC) 20 MG tablet Take 1 tablet (20 mg total) by mouth daily. Patient not taking: Reported on 04/29/2018 09/23/17   [name], FNP  fluticasone [facility]) 50 MCG/ACT nasal spray Place 2 sprays into both nostrils daily. Patient not taking: Reported on 04/29/2018 11/24/17   Lanae Boast, FNP  gabapentin (NEURONTIN) 300 MG capsule TAKE 1 CAPSULE (300 MG TOTAL) BY MOUTH 3 (THREE) TIMES DAILY. Patient not taking: Reported on 04/29/2018 10/04/17   [name], FNP  HYDROcodone-acetaminophen (NORCO/VICODIN) 5-325 MG tablet Take 1-2 tablets by mouth every 6 (six) hours as needed for severe pain. Patient not taking: Reported on 01/27/2018 05/30/17   Frederica Kuster, PA-C  ibuprofen (ADVIL,MOTRIN) 200 MG tablet Take 400 mg by mouth every 6 (six) hours as needed for headache, mild pain or moderate pain.    [provider]  ibuprofen (ADVIL,MOTRIN) 800 MG tablet TAKE 1 TABLET BY MOUTH EVERY 6 HOURS AS NEEDED FOR FEVER OR HEADACHE (PAIN). Patient not taking: Reported on 04/29/2018 03/22/18   Lanae Boast, FNP  metoprolol tartrate (LOPRESSOR) 25 MG tablet Take 1 tablet (25 mg total) by mouth 2 (two) times daily. 05/10/18   Jacqlyn Larsen, PA-C  naproxen (NAPROSYN) 500 MG tablet Take 1 tablet (500 mg total) by mouth 2 (two) times daily as needed. 06/13/18   Ward, Ozella Almond, PA-C  norgestimate-ethinyl estradiol (SPRINTEC 28) 0.25-35 MG-MCG tablet Take 1 tablet by mouth daily. Patient not taking: Reported on 04/29/2018 01/27/18   Lanae Boast, FNP  promethazine (PHENERGAN) 25 MG tablet Take 1 tablet (25 mg total) by mouth every 8 (eight) hours as needed for nausea or vomiting. Patient not taking: Reported on 04/29/2018 11/24/17   Lanae Boast, FNP  tretinoin (RETIN-A) 0.05 % cream Apply topically at bedtime. Patient not taking: Reported on 04/29/2018 01/27/18   Lanae Boast, FNP    Family History Family History  Problem Relation Age of Onset  . Breast cancer Mother   . Pulmonary embolism Mother        died of PE  . Colon cancer Mother   . Irritable bowel syndrome Mother   . Cancer Mother   . Hypertension Father   . Diabetes Paternal Grandmother   . Heart disease Neg Hx   . Stroke Neg Hx     Social History Social History   Tobacco Use  . Smoking status: Current Some Day Smoker    Years: 0.50    Types: Cigarettes, Cigars  . Smokeless tobacco: Never Used  . Tobacco comment: smokes black and milds - last use early-mid August    Substance Use Topics  . Alcohol use: No  . Drug use: Yes    Types: Marijuana    Comment: 2+ times per month     Allergies   Other; Penicillins; Shellfish-derived products; Shrimp [shellfish allergy]; and Sulfa antibiotics   Review of Systems Review of Systems  Musculoskeletal: Positive for arthralgias and myalgias.  Neurological: Negative for weakness.  All other systems reviewed and are negative.    Physical Exam Updated Vital Signs BP 112/75   Pulse 90  Temp 98.8 F (37.1 C) (Oral)   Resp 18   Ht 5\' 9"  (1.753 m)   Wt 120.2 kg   LMP 05/30/2018   SpO2 100%   BMI 39.13 kg/m   Physical Exam Vitals signs and nursing note reviewed.  Constitutional:      General: She is not in acute distress.    Appearance: She is well-developed.  HENT:     Head: Normocephalic and atraumatic.  Neck:     Musculoskeletal: Neck supple.  Cardiovascular:     Rate and Rhythm: Normal rate and regular rhythm.     Heart sounds: Normal heart sounds. No murmur.  Pulmonary:     Effort: Pulmonary effort is normal. No respiratory distress.     Breath sounds: Normal breath sounds.  Musculoskeletal:     Comments: Tenderness and mild overlying swelling to the medial malleolus and just underneath the malleolus.  No overlying skin changes.  Full range of motion.  2+ DP.  Sensation intact.  No calf tenderness.  Skin:    General: Skin is warm and dry.  Neurological:     Mental Status: She is alert and oriented to person, place, and time.      ED Treatments / Results  Labs (all labs ordered are listed, but only abnormal results are displayed) Labs Reviewed - No data to display  EKG None  Radiology Dg Ankle Complete Right  Result Date: 06/13/2018 CLINICAL DATA:  Medial right ankle pain and swelling for 3-4 days. No known injury. Initial encounter. EXAM: RIGHT ANKLE - COMPLETE 3+ VIEW COMPARISON:  Plain films right ankle 10/13/2006. Plain films right foot 08/31/2013. FINDINGS: There is no  acute bony or joint abnormality. The patient has talonavicular osteoarthritis which is worse than on the prior studies with increased osteophytosis about the joint. The anterior process of the calcaneus nearly abuts the navicular. Small calcaneal spurs are noted. Soft tissues are unremarkable. IMPRESSION: No acute abnormality. The anterior process of the calcaneus nearly abuts the navicular suspicious for tarsal coalition especially in light of increased osteophytosis about the talonavicular joint. Nonemergent CT of the ankle without contrast could be used for further evaluation. Electronically Signed   By: Inge Rise M.D.   On: 06/13/2018 11:59   Ct Ankle Right Wo Contrast  Result Date: 06/13/2018 CLINICAL DATA:  Right ankle pain and swelling for the past 3-4 days. No injury. EXAM: CT OF THE RIGHT ANKLE WITHOUT CONTRAST TECHNIQUE: Multidetector CT imaging of the right ankle was performed according to the standard protocol. Multiplanar CT image reconstructions were also generated. COMPARISON:  Right ankle x-rays from same day. FINDINGS: Bones/Joint/Cartilage No acute fracture or dislocation. The ankle mortise is symmetric. The talar dome is intact. Small tibiotalar joint effusion. Subtalar and talonavicular joint space narrowing with marginal osteophytes. No tarsal coalition. Type 2 os navicular. Ligaments Suboptimally assessed by CT. Muscles and Tendons Grossly intact. Questionable enlargement of the distal posterior tibial tendon with surrounding soft tissue swelling. Soft tissues Medial ankle soft tissue swelling. No fluid collection or hematoma. No soft tissue mass. IMPRESSION: 1.  No acute osseous abnormality.  No tarsal coalition. 2. Questionable enlargement of the distal posterior tibial tendon with surrounding soft tissue swelling may reflect underlying tendinopathy. Consider non-emergent MRI of the ankle for further evaluation. 3. Subtalar and talonavicular osteoarthritis. Electronically Signed    By: Titus Dubin M.D.   On: 06/13/2018 14:15    Procedures Procedures (including critical care time)  Medications Ordered in ED Medications  naproxen (NAPROSYN)  tablet 500 mg (500 mg Oral Given 06/13/18 1229)  HYDROcodone-acetaminophen (NORCO/VICODIN) 5-325 MG per tablet 1 tablet (1 tablet Oral Given 06/13/18 1231)  diphenhydrAMINE (BENADRYL) capsule 25 mg (25 mg Oral Given 06/13/18 1354)  ondansetron (ZOFRAN-ODT) disintegrating tablet 4 mg (4 mg Oral Given 06/13/18 1401)     Initial Impression / Assessment and Plan / ED Course  I have reviewed the triage vital signs and the nursing notes.  Pertinent labs & imaging results that were available during my care of the patient were reviewed by me and considered in my medical decision making (see chart for details).       Amanda Davenport is a 38 y.o. female who presents to ED for right ankle pain over the last 3 to 4 days.  Denies any known injury, but was ambulatory more so than usual recently.  Neurovascularly intact on exam.  X-ray obtained showing no acute abnormalities, but does have concerns for tarsal coalition recommending CT of the ankle for further evaluation.  CT shows no tarsal coalition or acute abnormalities, but does have a questionable enlargement of the distal PT tendon with surrounding soft tissue swelling.  Physical exam correlates with this as she is tender and has some swelling just under the medial malleolus where PT tendon lies.  We discussed findings and strongly encouraged her to follow-up with orthopedics.  Referral information sent.  Symptomatic home care instructions were discussed and all questions were answered.  Final Clinical Impressions(s) / ED Diagnoses   Final diagnoses:  Right ankle pain, unspecified chronicity    ED Discharge Orders         Ordered    naproxen (NAPROSYN) 500 MG tablet  2 times daily PRN     06/13/18 1443           Ward, Ozella Almond, PA-C 06/13/18 1450    Valarie Merino,  MD 06/13/18 1551

## 2018-07-06 ENCOUNTER — Telehealth: Payer: Self-pay

## 2018-07-06 NOTE — Telephone Encounter (Signed)
Patient would like a note to be out of work because of her immune system. Starting from 07/06/2018 until the Cov-19 clears.

## 2018-07-07 ENCOUNTER — Encounter: Payer: Self-pay | Admitting: Family Medicine

## 2018-07-07 NOTE — Progress Notes (Signed)
Please print for patient.

## 2018-07-29 ENCOUNTER — Encounter: Payer: Self-pay | Admitting: Family Medicine

## 2018-07-29 ENCOUNTER — Ambulatory Visit (INDEPENDENT_AMBULATORY_CARE_PROVIDER_SITE_OTHER): Payer: Self-pay | Admitting: Family Medicine

## 2018-07-29 ENCOUNTER — Other Ambulatory Visit: Payer: Self-pay

## 2018-07-29 DIAGNOSIS — L732 Hidradenitis suppurativa: Secondary | ICD-10-CM

## 2018-07-29 DIAGNOSIS — I456 Pre-excitation syndrome: Secondary | ICD-10-CM

## 2018-07-29 DIAGNOSIS — D508 Other iron deficiency anemias: Secondary | ICD-10-CM

## 2018-07-29 MED ORDER — METOPROLOL TARTRATE 25 MG PO TABS: 25.0000 mg | ORAL_TABLET | Freq: Two times a day (BID) | ORAL | 0 refills | Status: DC

## 2018-07-29 NOTE — Progress Notes (Signed)
  Patient [facility]  Virtual Visit via Telephone Note  I connected with Amanda Davenport on 07/29/18 at  2:40 PM EDT by telephone and verified that I am speaking with the correct person using two identifiers.   I discussed the limitations, risks, security and privacy concerns of performing an evaluation and management service by telephone and the availability of in person appointments. I also discussed with the patient that there may be a patient responsible charge related to this service. The patient expressed understanding and agreed to proceed.   History of Present Illness: Amanda Davenport  has a past medical history of Allergy, Anemia, Arrhythmia, Chronic headache, Knee pain, Nearsightedness, Neuropathy, Obesity, Recurrent boils, and WPW (Wolff-Parkinson-White syndrome). Patient states that she stopped taking all medications. She states that she has been out of work and cannot afford her medications. Also states that she has a flare of hidratenitis and has an opening under her left axilla. She states that she has been putting manuka honey and hemp cream to the area x 3 days. Denies fever, chills, or night sweats.    Observations/Objective: Patient with regular voice tone, rate and rhythm. Speaking calmly and is in no apparent distress.    Assessment and Plan: 1. Wolff-Parkinson-White (WPW) syndrome - metoprolol tartrate (LOPRESSOR) 25 MG tablet; Take 1 tablet (25 mg total) by mouth 2 (two) times daily.  Dispense: 60 tablet; Refill: 0  2. Hydradenitis Patient to come in for labs and have area assessed.   3. Other iron deficiency anemia - Iron, TIBC and Ferritin Panel - CBC with Differential - Comprehensive metabolic panel     Follow Up Instructions:  We discussed hand washing, using hand sanitizer when soap and water are not available, only going out when absolutely necessary, and social distancing. Explained to patient that she is  immunocompromised and will need to take precautions during this time.   I discussed the assessment and treatment plan with the patient. The patient was provided an opportunity to ask questions and all were answered. The patient agreed with the plan and demonstrated an understanding of the instructions.   The patient was advised to call back or seek an in-person evaluation if the symptoms worsen or if the condition fails to improve as anticipated.  I provided 10 minutes of non-face-to-face time during this encounter.  Ms. Andr L. Nathaneil Canary, FNP-BC [facility] [address] [place], [place] [address] [phone]

## 2018-07-30 MED FILL — ?METOPROLOL 25 MG TABLET: 25 | 30 days supply | Qty: 60 | Fill #0

## 2018-08-01 ENCOUNTER — Other Ambulatory Visit: Payer: Self-pay

## 2018-08-01 MED ORDER — CLINDAMYCIN HCL 300 MG PO CAPS: 300.0000 mg | ORAL_CAPSULE | Freq: Two times a day (BID) | ORAL | 0 refills | Status: AC

## 2018-08-01 MED ORDER — ACETAMINOPHEN-CODEINE #3 300-30 MG PO TABS: 1.0000 | ORAL_TABLET | ORAL | 0 refills | Status: AC | PRN

## 2018-08-01 MED ORDER — FLUCONAZOLE 150 MG PO TABS: 150.0000 mg | ORAL_TABLET | Freq: Once | ORAL | 0 refills | Status: AC

## 2018-08-01 MED FILL — FLUCONAZOLE 150 MG TABS: 150 | 1 days supply | Qty: 1 | Fill #0

## 2018-08-01 MED FILL — ACETAMINOPHEN/COD #3 TABLET: 300-30 | 7 days supply | Qty: 30 | Fill #0

## 2018-08-01 MED FILL — CLINDAMYCIN HCL 300 MG CAP: 300 | 10 days supply | Qty: 20 | Fill #0

## 2018-08-01 NOTE — Progress Notes (Unsigned)
  Patient [facility]   Progress Note: Sick Visit Provider: Lanae Boast, FNP  SUBJECTIVE:   Amanda Davenport is a 38 y.o. female who  has a past medical history of Allergy, Anemia, Arrhythmia, Chronic headache, Knee pain, Nearsightedness, Neuropathy, Obesity, Recurrent boils, and WPW (Wolff-Parkinson-White syndrome).. Patient presents today for No chief complaint on file.  Patient presents for lab work. She has a history of HS and currently has several open sores under her left axilla. She states that she has been  ROS   OBJECTIVE: There were no vitals taken for this visit.  Wt Readings from Last 3 Encounters:  06/13/18 265 lb (120.2 kg)  05/10/18 260 lb (117.9 kg)  04/29/18 269 lb (122 kg)     Physical Exam   Left axilla   Right axilla  ASSESSMENT/PLAN:   There are no diagnoses linked to this encounter.       The patient was given clear instructions to go to ER or return to medical center if symptoms do not improve, worsen or new problems develop. The patient verbalized understanding and agreed with plan of care.   Ms. Doug Sou. Nathaneil Canary, FNP-BC [facility] [address] [place], [place] [address] [phone]     This note has been created with Dragon speech recognition software and smart phrase technology. Any transcriptional errors are unintentional.

## 2018-08-02 LAB — COMPREHENSIVE METABOLIC PANEL
ALT: 7 IU/L (ref 0–32)
AST: 12 IU/L (ref 0–40)
Albumin/Globulin Ratio: 0.7 — ABNORMAL LOW (ref 1.2–2.2)
Albumin: 3.3 g/dL — ABNORMAL LOW (ref 3.8–4.8)
Alkaline Phosphatase: 75 IU/L (ref 39–117)
BUN/Creatinine Ratio: 9 (ref 9–23)
BUN: 7 mg/dL (ref 6–20)
Bilirubin Total: 0.2 mg/dL (ref 0.0–1.2)
CO2: 21 mmol/L (ref 20–29)
Calcium: 8.7 mg/dL (ref 8.7–10.2)
Chloride: 106 mmol/L (ref 96–106)
Creatinine, Ser: 0.77 mg/dL (ref 0.57–1.00)
GFR calc Af Amer: 114 mL/min/{1.73_m2} (ref >59)
GFR calc non Af Amer: 99 mL/min/{1.73_m2} (ref >59)
Globulin, Total: 4.5 g/dL (ref 1.5–4.5)
Glucose: 87 mg/dL (ref 65–99)
Potassium: 4 mmol/L (ref 3.5–5.2)
Sodium: 140 mmol/L (ref 134–144)
Total Protein: 7.8 g/dL (ref 6.0–8.5)

## 2018-08-02 LAB — CBC WITH DIFFERENTIAL/PLATELET
Basophils Absolute: 0.1 10*3/uL (ref 0.0–0.2)
Basos: 1 %
EOS (ABSOLUTE): 0.2 10*3/uL (ref 0.0–0.4)
Eos: 3 %
Hematocrit: 33.4 % — ABNORMAL LOW (ref 34.0–46.6)
Hemoglobin: 11.1 g/dL (ref 11.1–15.9)
Immature Grans (Abs): 0 10*3/uL (ref 0.0–0.1)
Immature Granulocytes: 0 %
Lymphocytes Absolute: 2 10*3/uL (ref 0.7–3.1)
Lymphs: 30 %
MCH: 28.3 pg (ref 26.6–33.0)
MCHC: 33.2 g/dL (ref 31.5–35.7)
MCV: 85 fL (ref 79–97)
Monocytes Absolute: 0.4 10*3/uL (ref 0.1–0.9)
Monocytes: 6 %
Neutrophils Absolute: 4.1 10*3/uL (ref 1.4–7.0)
Neutrophils: 60 %
Platelets: 474 10*3/uL — ABNORMAL HIGH (ref 150–450)
RBC: 3.92 x10E6/uL (ref 3.77–5.28)
RDW: 14.1 % (ref 11.7–15.4)
WBC: 6.8 10*3/uL (ref 3.4–10.8)

## 2018-08-02 LAB — IRON,TIBC AND FERRITIN PANEL
Ferritin: 168 ng/mL — ABNORMAL HIGH (ref 15–150)
Iron Saturation: 23 % (ref 15–55)
Iron: 44 ug/dL (ref 27–159)
Total Iron Binding Capacity: 190 ug/dL — ABNORMAL LOW (ref 250–450)
UIBC: 146 ug/dL (ref 131–425)

## 2018-08-11 NOTE — Telephone Encounter (Signed)
Message sent to provider 

## 2018-08-31 ENCOUNTER — Telehealth: Payer: Self-pay

## 2018-08-31 NOTE — Telephone Encounter (Signed)
Called to do COVID Screening for appointment tomorrow. No answer. Left a message to call back. Thanks! 

## 2018-09-01 ENCOUNTER — Other Ambulatory Visit: Payer: Self-pay

## 2018-09-01 ENCOUNTER — Ambulatory Visit (INDEPENDENT_AMBULATORY_CARE_PROVIDER_SITE_OTHER): Payer: Self-pay | Admitting: Family Medicine

## 2018-09-01 ENCOUNTER — Encounter: Payer: Self-pay | Admitting: Family Medicine

## 2018-09-01 VITALS — BP 113/65 | HR 104 | Temp 99.2°F | Resp 16 | Ht 69.0 in | Wt 269.0 lb

## 2018-09-01 DIAGNOSIS — R1013 Epigastric pain: Secondary | ICD-10-CM

## 2018-09-01 MED ORDER — OMEPRAZOLE 40 MG PO CPDR: 40.0000 mg | DELAYED_RELEASE_CAPSULE | Freq: Every day | ORAL | 3 refills | Status: DC

## 2018-09-01 MED FILL — ?OMEPRAZOLE 20 MG CAPSULE D: 20 | 30 days supply | Qty: 60 | Fill #0

## 2018-09-01 NOTE — Patient Instructions (Signed)
Gastritis, Adult Gastritis is inflammation of the stomach. There are two kinds of gastritis:  Acute gastritis. This kind develops suddenly.  Chronic gastritis. This kind is much more common and lasts for a long time. Gastritis happens when the lining of the stomach becomes weak or gets damaged. Without treatment, gastritis can lead to stomach bleeding and ulcers. What are the causes? This condition may be caused by:  An infection.  Drinking too much alcohol.  Certain medicines. These include steroids, antibiotics, and some over-the-counter medicines, such as aspirin or ibuprofen.  Having too much acid in the stomach.  A disease of the intestines or stomach.  Stress.  An allergic reaction.  Crohn's disease.  Some cancer treatments (radiation). Sometimes the cause of this condition is not known. What are the signs or symptoms? Symptoms of this condition include:  Pain or a burning sensation in the upper abdomen.  Nausea.  Vomiting.  An uncomfortable feeling of fullness after eating.  Weight loss.  Bad breath.  Blood in your vomit or stools. In some cases, there are no symptoms. How is this diagnosed? This condition may be diagnosed with:  Your medical history and a description of your symptoms.  A physical exam.  Tests. These can include: ? Blood tests. ? Stool tests. ? A test in which a thin, flexible instrument with a light and a camera is passed down the esophagus and into the stomach (upper endoscopy). ? A test in which a sample of tissue is taken for testing (biopsy). How is this treated? This condition may be treated with medicines. The medicines that are used vary depending on the cause of the gastritis:  If the condition is caused by a bacterial infection, you may be given antibiotic medicines.  If the condition is caused by too much acid in the stomach, you may be given medicines called H2 blockers, proton pump inhibitors, or antacids. Treatment  may also involve stopping the use of certain medicines, such as aspirin, ibuprofen, or other NSAIDs. Follow these instructions at home: Medicines  Take over-the-counter and prescription medicines only as told by your health care provider.  If you were prescribed an antibiotic medicine, take it as told by your health care provider. Do not stop taking the antibiotic even if you start to feel better. Eating and drinking   Eat small, frequent meals instead of large meals.  Avoid foods and drinks that make your symptoms worse.  Drink enough fluid to keep your urine pale yellow. Alcohol use  Do not drink alcohol if: ? Your health care provider tells you not to drink. ? You are pregnant, may be pregnant, or are planning to become pregnant.  If you drink alcohol: ? Limit your use to:  0-1 drink a day for women.  0-2 drinks a day for men. ? Be aware of how much alcohol is in your drink. In the [place] one drink equals one 12 oz bottle of beer (355 mL), one 5 oz glass of wine (148 mL), or one 1 oz glass of hard liquor (44 mL). General instructions  Talk with your health care provider about ways to manage stress, such as getting regular exercise or practicing deep breathing, meditation, or yoga.  Do not use any products that contain nicotine or tobacco, such as cigarettes and e-cigarettes. If you need help quitting, ask your health care provider.  Keep all follow-up visits as told by your health care provider. This is important. Contact a health care provider if:  Your   symptoms get worse.  Your symptoms return after treatment. Get help right away if:  You vomit blood or material that looks like coffee grounds.  You have black or dark red stools.  You are unable to keep fluids down.  Your abdominal pain gets worse.  You have a fever.  You do not feel better after one week. Summary  Gastritis is inflammation of the lining of the stomach that can occur suddenly (acute) or  develop slowly over time (chronic).  This condition is diagnosed with a medical history, a physical exam, or tests.  This condition may be treated with medicines to treat infection or medicines to reduce the amount of acid in your stomach.  Follow your health care provider's instructions about taking medicines, making changes to your diet, and knowing when to call for help. This information is not intended to replace advice given to you by your health care provider. Make sure you discuss any questions you have with your health care provider. Document Released: 03/17/2001 Document Revised: 08/10/2017 Document Reviewed: 08/10/2017 Elsevier Interactive Patient Education  2019 Elsevier Inc.  

## 2018-09-01 NOTE — Progress Notes (Signed)
  Patient [facility]   Progress Note: Sick Visit Provider: Lanae Boast, FNP  SUBJECTIVE:   Amanda Davenport is a 38 y.o. female who  has a past medical history of Allergy, Anemia, Arrhythmia, Chronic headache, Knee pain, Nearsightedness, Neuropathy, Obesity, Recurrent boils, and WPW (Wolff-Parkinson-White syndrome).. Patient presents today for Abdominal Pain (abdominal pain/cramping in upper abdomen x 1 week ) Patient reports abdominal pain in the epigastric region x 1 week. She reports that the pain in intermittent and cramping. She states that she has gotten mild relief with using tylenol and a heating pad. Denies nausea, vomitting, increased flatulence/belching, diarrhea or constipation.  Review of Systems  Gastrointestinal: Positive for abdominal pain. Negative for nausea and vomiting.  All other systems reviewed and are negative.    OBJECTIVE: BP 113/65 (BP Location: Right Arm, Patient Position: Sitting, Cuff Size: Normal)   Pulse (!) 104   Temp 99.2 F (37.3 C) (Oral)   Resp 16   Ht 5\' 9"  (1.753 m)   Wt 269 lb (122 kg)   LMP 08/23/2018   SpO2 100%   BMI 39.72 kg/m   Wt Readings from Last 3 Encounters:  09/01/18 269 lb (122 kg)  06/13/18 265 lb (120.2 kg)  05/10/18 260 lb (117.9 kg)     Physical Exam Vitals signs and nursing note reviewed.  Constitutional:      General: She is not in acute distress.    Appearance: Normal appearance.  HENT:     Head: Normocephalic and atraumatic.  Eyes:     Extraocular Movements: Extraocular movements intact.     Conjunctiva/sclera: Conjunctivae normal.     Pupils: Pupils are equal, round, and reactive to light.  Cardiovascular:     Rate and Rhythm: Normal rate and regular rhythm.     Heart sounds: No murmur.  Pulmonary:     Effort: Pulmonary effort is normal.     Breath sounds: Normal breath sounds.  Abdominal:     Tenderness: There is abdominal tenderness in the epigastric area.   Musculoskeletal: Normal range of motion.  Skin:    General: Skin is warm and dry.  Neurological:     Mental Status: She is alert and oriented to person, place, and time.  Psychiatric:        Mood and Affect: Mood normal.        Behavior: Behavior normal.        Thought Content: Thought content normal.        Judgment: Judgment normal.     ASSESSMENT/PLAN: 1. Epigastric abdominal pain Discussed dietary changes. Labs pending.  - H. pylori breath test - omeprazole (PRILOSEC) 40 MG capsule; Take 1 capsule (40 mg total) by mouth daily.  Dispense: 30 capsule; Refill: 3        The patient was given clear instructions to go to ER or return to medical center if symptoms do not improve, worsen or new problems develop. The patient verbalized understanding and agreed with plan of care.   Ms. Doug Sou. Nathaneil Canary, FNP-BC [facility] [address] [place], [place] [address] [phone]     This note has been created with Dragon speech recognition software and smart phrase technology. Any transcriptional errors are unintentional.

## 2018-09-03 LAB — H. PYLORI BREATH TEST: H pylori Breath Test: NEGATIVE

## 2018-10-12 ENCOUNTER — Other Ambulatory Visit: Payer: Self-pay

## 2018-10-12 ENCOUNTER — Emergency Department (HOSPITAL_COMMUNITY)
Admission: EM | Admit: 2018-10-12 | Discharge: 2018-10-12 | Disposition: A | Discharge status: Home / Self Care | Payer: Self-pay | Attending: Emergency Medicine | Admitting: Emergency Medicine

## 2018-10-12 ENCOUNTER — Encounter (HOSPITAL_COMMUNITY): Payer: Self-pay

## 2018-10-12 DIAGNOSIS — H6002 Abscess of left external ear: Secondary | ICD-10-CM | POA: Insufficient documentation

## 2018-10-12 DIAGNOSIS — Y999 Unspecified external cause status: Secondary | ICD-10-CM | POA: Insufficient documentation

## 2018-10-12 DIAGNOSIS — Y939 Activity, unspecified: Secondary | ICD-10-CM | POA: Insufficient documentation

## 2018-10-12 DIAGNOSIS — Z79899 Other long term (current) drug therapy: Secondary | ICD-10-CM | POA: Insufficient documentation

## 2018-10-12 DIAGNOSIS — F17210 Nicotine dependence, cigarettes, uncomplicated: Secondary | ICD-10-CM | POA: Insufficient documentation

## 2018-10-12 DIAGNOSIS — T162XXA Foreign body in left ear, initial encounter: Secondary | ICD-10-CM | POA: Insufficient documentation

## 2018-10-12 DIAGNOSIS — Y929 Unspecified place or not applicable: Secondary | ICD-10-CM | POA: Insufficient documentation

## 2018-10-12 DIAGNOSIS — Z880 Allergy status to penicillin: Secondary | ICD-10-CM | POA: Insufficient documentation

## 2018-10-12 DIAGNOSIS — Y33XXXA Other specified events, undetermined intent, initial encounter: Secondary | ICD-10-CM | POA: Insufficient documentation

## 2018-10-12 MED ORDER — LIDOCAINE-EPINEPHRINE 2 %-1:100000 IJ SOLN
1.7000 mL | Freq: Once | INTRAMUSCULAR | Status: DC
  Filled 2018-10-12: qty 1

## 2018-10-12 MED ORDER — LIDOCAINE-PRILOCAINE 2.5-2.5 % EX CREA
TOPICAL_CREAM | Freq: Once | CUTANEOUS | Status: AC
  Administered 2018-10-12: 1 via TOPICAL
  Filled 2018-10-12: qty 5

## 2018-10-12 MED ORDER — LIDOCAINE HCL 2 % IJ SOLN
INTRAMUSCULAR | Status: AC
  Filled 2018-10-12: qty 20

## 2018-10-12 MED ORDER — BACITRACIN ZINC 500 UNIT/GM EX OINT
TOPICAL_OINTMENT | CUTANEOUS | Status: DC | PRN
  Filled 2018-10-12: qty 0.9

## 2018-10-12 NOTE — ED Provider Notes (Signed)
[facility] DEPT Provider Note   CSN: [id number] Arrival date & time: 10/12/18  1448    History   Chief Complaint Chief Complaint  Patient presents with  . Ear Injury  . Abdominal Pain    HPI Amanda Davenport is a 38 y.o. female.     HPI   She complains of pain in her left ear, at the site of your questions which were placed 2 weeks ago.  She feels like the ear is infected.  She started taking doxycycline, yesterday because she has recurrent draining from her left axilla, which she describes as caused by hidradenitis suppurativa.  She has ongoing cramping in her upper abdominal, associated with decreased stooling and constipation.  Recently seen by her PCP for same, had an H. pylori test that was negative and was prescribed Prilosec but did not start taking it.  That a prescription has been given to her.   Past Medical History:  Diagnosis Date  . Allergy   . Anemia    receives transfusions periodically  . Arrhythmia   . Chronic headache   . Knee pain   . Nearsightedness    wears glasses  . Neuropathy   . Obesity   . Recurrent boils   . WPW (Wolff-Parkinson-White syndrome)     Patient Active Problem List   Diagnosis Date Noted  . Ovarian cyst 04/29/2018  . Facial cellulitis   . Otitis externa   . Dental abscess 11/27/2016  . 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 Absolute anemia 05/21/2015  . Neck strain 12/11/2014  . Hydradenitis 12/11/2014  . Dental caries 12/11/2014  . Constipation 12/03/2014  . Neck pain 12/03/2014  . Myalgia and myositis 11/27/2014  . 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 Morbid obesity ([place]) 11/27/2014  . Hematochezia 12/16/2012  . Wolff-Parkinson-White (WPW) syndrome 03/01/2012  . Iron deficiency anemia 02/16/2012  . Tachycardia 01/28/2012  . Hidradenitis suppurativa 08/18/2011  . AXILLARY ABSCESS 12/30/2006  . ANKLE PAIN 10/11/2006    Past Surgical History:  Procedure Laterality Date  .  TONSILLECTOMY       OB History    Gravida  0   Para      Term      Preterm      AB      Living        SAB      TAB      Ectopic      Multiple      Live Births               Home Medications    Prior to Admission medications   Medication Sig Start Date End Date Taking? Authorizing Provider  Acetaminophen-Codeine (TYLENOL/CODEINE #3) 300-30 MG tablet Take 1 tablet by mouth every 4 (four) hours as needed for pain.    [provider]  chlorhexidine (HIBICLENS) 4 % external liquid Apply topically daily as needed. Patient not taking: Reported on 09/23/2017 06/23/17   [name], FNP  ibuprofen (ADVIL,MOTRIN) 200 MG tablet Take 400 mg by mouth every 6 (six) hours as needed for headache, mild pain or moderate pain.    [provider]  metoprolol tartrate (LOPRESSOR) 25 MG tablet Take 1 tablet (25 mg total) by mouth 2 (two) times daily. 07/29/18   Lanae Boast, FNP  omeprazole (PRILOSEC) 40 MG capsule Take 1 capsule (40 mg total) by mouth daily. 09/01/18   Lanae Boast, [place]nd[place]    Family History Family History  Problem Relation  Age of Onset  . Breast cancer Mother   . Pulmonary embolism Mother        died of PE  . Colon cancer Mother   . Irritable bowel syndrome Mother   . Cancer Mother   . Hypertension Father   . Diabetes Paternal Grandmother   . Heart disease Neg Hx   . Stroke Neg Hx     Social History Social History   Tobacco Use  . Smoking status: Current Some Day Smoker    Years: 0.50    Types: Cigarettes, Cigars  . Smokeless tobacco: Never Used  . Tobacco comment: smokes black and milds - last use early-mid August  Substance Use Topics  . Alcohol use: No  . Drug use: Yes    Types: Marijuana    Comment: 2+ times per month     Allergies   Other, Penicillins, Shellfish-derived products, Shrimp [shellfish allergy], and Sulfa antibiotics   Review of Systems Review of Systems  All other systems reviewed and are negative.     Physical Exam Updated Vital Signs BP 119/80 (BP Location: Right Arm)   Pulse 75   Temp 98.5 F (36.9 C) (Oral)   Resp 16   Ht 5\' 9"  (1.753 m)   Wt 117.9 kg   LMP 10/05/2018   SpO2 98%   BMI 38.40 kg/m   Physical Exam Vitals signs and nursing note reviewed.  Constitutional:      Appearance: She is well-developed.  HENT:     Head: Normocephalic and atraumatic.     Comments: Left ear, upper pinna, with 2 steroids in; only posterior portions of the studs are visible.  There is mild swelling with tenderness and induration, which encompasses the heads of the piercing studs.  No significant periauricular swelling, tenderness or adenopathy.    Right Ear: External ear normal.     Left Ear: External ear normal.  Eyes:     Conjunctiva/sclera: Conjunctivae normal.     Pupils: Pupils are equal, round, and reactive to light.  Neck:     Musculoskeletal: Normal range of motion and neck supple.     Trachea: Phonation normal.  Cardiovascular:     Rate and Rhythm: Normal rate and regular rhythm.  Pulmonary:     Effort: Pulmonary effort is normal.  Musculoskeletal: Normal range of motion.  Skin:    General: Skin is warm and dry.  Neurological:     Mental Status: She is alert and oriented to person, place, and time.     Cranial Nerves: No cranial nerve deficit.     Sensory: No sensory deficit.     Motor: No abnormal muscle tone.     Coordination: Coordination normal.  Psychiatric:        Mood and Affect: Mood normal.        Behavior: Behavior normal.        Thought Content: Thought content normal.        Judgment: Judgment normal.      ED Treatments / Results  Labs (all labs ordered are listed, but only abnormal results are displayed) Labs Reviewed - No data to display  EKG None  Radiology No results found.  Procedures .Marland KitchenIncision and Drainage  Date/Time: 10/12/2018 11:55 AM Performed by: Daleen Bo, MD Authorized by: Daleen Bo, MD   Consent:    Consent  obtained:  Verbal   Consent given by:  Patient   Risks discussed:  Bleeding, incomplete drainage, pain and infection   Alternatives discussed:  No  treatment Location:    Type:  Abscess (With foreign bodies)   Location: Left pinna. Pre-procedure details:    Skin preparation:  Betadine Anesthesia (see MAR for exact dosages):    Anesthesia method:  Topical application and local infiltration   Topical anesthetic:  EMLA cream   Local anesthetic:  Lidocaine 2% w/o epi Procedure type:    Complexity:  Simple Procedure details:    Needle aspiration: no     Incision types:  Single straight   Incision depth:  Subcutaneous   Scalpel blade:  11   Wound management:  Probed and deloculated, irrigated with saline and extensive cleaning   Drainage:  Purulent and bloody (Small bowel)   Drainage amount:  Scant   Wound treatment:  Wound left open   Packing materials:  None Post-procedure details:    Patient tolerance of procedure:  Tolerated well, no immediate complications Comments:     I was able to remove 2 ear studs, by grasping the ball with pickups, and pulling the back off, when retrieving the hearing portion with pickups.  Wound was explored after removal of the earrings, and there was no appreciation of additional foreign body.  Bleeding and drainage abated after irrigation with saline.   (including critical care time)  Medications Ordered in ED Medications  lidocaine-EPINEPHrine (XYLOCAINE W/EPI) 2 %-1:100000 (with pres) injection 1.7 mL (has no administration in time range)  lidocaine (XYLOCAINE) 2 % (with pres) injection (has no administration in time range)  lidocaine-prilocaine (EMLA) cream (1 application Topical Given 10/12/18 1036)     Initial Impression / Assessment and Plan / ED Course  I have reviewed the triage vital signs and the nursing notes.  Pertinent labs & imaging results that were available during my care of the patient were reviewed by me and considered in my medical  decision making (see chart for details).         Patient Vitals for the past 24 hrs:  BP Temp Temp src Pulse Resp SpO2 Height Weight  10/12/18 0638 119/80 98.5 F (36.9 C) Oral 75 16 98 % 5\' 9"  (1.753 m) 117.9 kg    11:58 AM Reevaluation with update and discussion. After initial assessment and treatment, an updated evaluation reveals she is more comfortable after treatment.  Findings discussed with the patient and all questions were answered. Daleen Bo   Medical Decision Making: Foreign body left pinna, x2, with local abscess, treated with I&D and removal of foreign body.  No apparent infection of cartilaginous structures.  No indication for further treatment in the ED, antibiotic treatment or hospitalization.  CRITICAL CARE-no Performed by: Daleen Bo  Nursing Notes Reviewed/ Care Coordinated Applicable Imaging Reviewed Interpretation of Laboratory Data incorporated into ED treatment  The patient appears reasonably screened and/or stabilized for discharge and I doubt any other medical condition or other [facility] requiring further screening, evaluation, or treatment in the ED at this time prior to discharge.  Plan: Home Medications-ibuprofen for pain; Home Treatments-compress 3-4 times a day until healed; return here if the recommended treatment, does not improve the symptoms; Recommended follow up-return here if needed for problems.   Final Clinical Impressions(s) / ED Diagnoses   Final diagnoses:  Foreign body in auricle of left ear, initial encounter  Abscess of left external ear    ED Discharge Orders    None       Daleen Bo, MD 10/12/18 1211

## 2018-10-12 NOTE — ED Notes (Signed)
Bed: WTR9 Expected date:  Expected time:  Means of arrival:  Comments: 

## 2018-10-12 NOTE — Discharge Instructions (Addendum)
Use a warm moist compress on the ear 3 or 4 times a day, for several days until the wound heals.  For pain, use ibuprofen 400 mg 3 times a day.  Return here or see your doctor as needed for problems.

## 2018-10-12 NOTE — ED Triage Notes (Signed)
Pt complains of ear pain from two [place] Pt also states she's been having abdominal pain for one month

## 2018-10-28 ENCOUNTER — Ambulatory Visit: Payer: Self-pay | Admitting: Family Medicine

## 2018-11-09 ENCOUNTER — Other Ambulatory Visit: Payer: Self-pay

## 2018-11-09 ENCOUNTER — Encounter: Payer: Self-pay | Admitting: Family Medicine

## 2018-11-09 ENCOUNTER — Ambulatory Visit (INDEPENDENT_AMBULATORY_CARE_PROVIDER_SITE_OTHER): Payer: Self-pay | Admitting: Family Medicine

## 2018-11-09 VITALS — BP 125/73 | HR 89 | Temp 99.1°F | Resp 16 | Ht 69.0 in | Wt 266.0 lb

## 2018-11-09 DIAGNOSIS — F330 Major depressive disorder, recurrent, mild: Secondary | ICD-10-CM

## 2018-11-09 DIAGNOSIS — G629 Polyneuropathy, unspecified: Secondary | ICD-10-CM

## 2018-11-09 DIAGNOSIS — I456 Pre-excitation syndrome: Secondary | ICD-10-CM

## 2018-11-09 DIAGNOSIS — K5903 Drug induced constipation: Secondary | ICD-10-CM

## 2018-11-09 DIAGNOSIS — D508 Other iron deficiency anemias: Secondary | ICD-10-CM

## 2018-11-09 MED ORDER — FLUOXETINE HCL 20 MG PO CAPS: 20.0000 mg | ORAL_CAPSULE | Freq: Every day | ORAL | 3 refills | Status: DC

## 2018-11-09 MED ORDER — DOCUSATE SODIUM 100 MG PO CAPS: 100.0000 mg | ORAL_CAPSULE | Freq: Two times a day (BID) | ORAL | 0 refills | Status: DC

## 2018-11-09 MED ORDER — GABAPENTIN 300 MG PO CAPS: 300.0000 mg | ORAL_CAPSULE | Freq: Three times a day (TID) | ORAL | 3 refills | Status: DC

## 2018-11-09 MED FILL — FLUoxetine HCL 20 MG CAPS: 20 | 30 days supply | Qty: 30 | Fill #0

## 2018-11-09 MED FILL — GABAPENTIN 300 MG CAPSULE: 300 | 30 days supply | Qty: 90 | Fill #0

## 2018-11-09 NOTE — Patient Instructions (Signed)
Iron Deficiency Anemia, Adult Iron-deficiency anemia is when you have a low amount of red blood cells or hemoglobin. This happens because you have too little iron in your body. Hemoglobin carries oxygen to parts of the body. Anemia can cause your body to not get enough oxygen. It may or may not cause symptoms. Follow these instructions at home: Medicines  Take over-the-counter and prescription medicines only as told by your doctor. This includes iron pills (supplements) and vitamins.  If you cannot handle taking iron pills by mouth, ask your doctor about getting iron through: ? A vein (intravenously). ? A shot (injection) into a muscle.  Take iron pills when your stomach is empty. If you cannot handle this, take them with food.  Do not drink milk or take antacids at the same time as your iron pills.  To prevent trouble pooping (constipation), eat fiber or take medicine (stool softener) as told by your doctor. Eating and drinking   Talk with your doctor before changing the foods you eat. He or she may tell you to eat foods that have a lot of iron, such as: ? Liver. ? Lowfat (lean) beef. ? Breads and cereals that have iron added to them (fortified breads and cereals). ? Eggs. ? Dried fruit. ? Dark green, leafy vegetables.  Drink enough fluid to keep your pee (urine) clear or pale yellow.  Eat fresh fruits and vegetables that are high in vitamin C. They help your body to use iron. Foods with a lot of vitamin C include: ? Oranges. ? Peppers. ? Tomatoes. ? Mangoes. General instructions  Return to your normal activities as told by your doctor. Ask your doctor what activities are safe for you.  Keep yourself clean, and keep things clean around you (your surroundings). Anemia can make you get sick more easily.  Keep all follow-up visits as told by your doctor. This is important. Contact a doctor if:  You feel sick to your stomach (nauseous).  You throw up (vomit).  You feel  weak.  You are sweating for no clear reason.  You have trouble pooping, such as: ? Pooping (having a bowel movement) less than 3 times a week. ? Straining to poop. ? Having poop that is hard, dry, or larger than normal. ? Feeling full or bloated. ? Pain in the lower belly. ? Not feeling better after pooping. Get help right away if:  You pass out (faint). If this happens, do not drive yourself to the hospital. Call your local emergency services (911 in the [place]  You have chest pain.  You have shortness of breath that: ? Is very bad. ? Gets worse with physical activity.  You have a fast heartbeat.  You get light-headed when getting up from sitting or lying down. This information is not intended to replace advice given to you by your health care provider. Make sure you discuss any questions you have with your health care provider. Document Released: 04/25/2010 Document Revised: 03/05/2017 Document Reviewed: 12/11/2015 Elsevier Patient Education  2020 [employer]. Constipation, Adult Constipation is when a person:  Poops (has a bowel movement) fewer times in a week than normal.  Has a hard time pooping.  Has poop that is dry, hard, or bigger than normal. Follow these instructions at home: Eating and drinking   Eat foods that have a lot of fiber, such as: ? Fresh fruits and vegetables. ? Whole grains. ? Beans.  Eat less of foods that are high in fat, low in fiber,  or overly processed, such as: ? [place] fries. ? Hamburgers. ? Cookies. ? Candy. ? Soda.  Drink enough fluid to keep your pee (urine) clear or pale yellow. General instructions  Exercise regularly or as told by your doctor.  Go to the restroom when you feel like you need to poop. Do not hold it in.  Take over-the-counter and prescription medicines only as told by your doctor. These include any fiber supplements.  Do pelvic floor retraining exercises, such as: ? Doing deep breathing while relaxing  your lower belly (abdomen). ? Relaxing your pelvic floor while pooping.  Watch your condition for any changes.  Keep all follow-up visits as told by your doctor. This is important. Contact a doctor if:  You have pain that gets worse.  You have a fever.  You have not pooped for 4 days.  You throw up (vomit).  You are not hungry.  You lose weight.  You are bleeding from the anus.  You have thin, pencil-like poop (stool). Get help right away if:  You have a fever, and your symptoms suddenly get worse.  You leak poop or have blood in your poop.  Your belly feels hard or bigger than normal (is bloated).  You have very bad belly pain.  You feel dizzy or you faint. This information is not intended to replace advice given to you by your health care provider. Make sure you discuss any questions you have with your health care provider. Document Released: 09/09/2007 Document Revised: 03/05/2017 Document Reviewed: 09/11/2015 Elsevier Patient Education  2020 [employer].

## 2018-11-09 NOTE — Progress Notes (Signed)
Patient [name]   Progress Note: General Provider: Lanae Boast, FNP  SUBJECTIVE:   Amanda Davenport is a 38 y.o. female who  has a past medical history of Allergy, Anemia, Arrhythmia, Chronic headache, Knee pain, Nearsightedness, Neuropathy, Obesity, Recurrent boils, and WPW (Wolff-Parkinson-White syndrome).. Patient presents today for Follow-up, Blood In Stools, Shortness of Breath, and Peripheral Neuropathy (in both legs/feet )   Patient states that she is no longer taking any of her prescription medication. Patient states that she is having blood on the toilet with bowel movements. She states that she has a history of hemorrhoids and has one currently. She reports that she is having bowel movements 2 times per day. Denies straining.  Patient states that she has a history of peripheral neuropathy and stopped taking gabapentin several months ago. She states that gabapentin did help with neuropathy.  Patient states that she is having shortness of breath that has increased in the past 2 weeks. She states that she is no longer taking metoprolol. Has a history of Wolff-Parkinson-White syndrome and has not seen cardiology in a few years.   Review of Systems  Constitutional: Negative.   HENT: Negative.   Eyes: Negative.   Respiratory: Positive for shortness of breath.   Cardiovascular: Negative.   Gastrointestinal: Negative.   Genitourinary: Negative.   Musculoskeletal: Negative.   Skin: Negative.   Neurological: Negative.   Psychiatric/Behavioral: Negative.      OBJECTIVE: BP 125/73 (BP Location: Right Arm, Patient Position: Sitting, Cuff Size: Large)   Pulse 89   Temp 99.1 F (37.3 C) (Oral)   Resp 16   Ht 5\' 9"  (1.753 m)   Wt 266 lb (120.7 kg)   LMP 11/06/2018   SpO2 99%   BMI 39.28 kg/m   Wt Readings from Last 3 Encounters:  12/01/18 263 lb (119.3 kg)  11/09/18 266 lb (120.7 kg)  10/12/18 260 lb (117.9 kg)     Physical Exam  Vitals signs and nursing note reviewed.  Constitutional:      General: She is not in acute distress.    Appearance: Normal appearance.  HENT:     Head: Normocephalic and atraumatic.  Eyes:     Extraocular Movements: Extraocular movements intact.     Conjunctiva/sclera: Conjunctivae normal.     Pupils: Pupils are equal, round, and reactive to light.  Cardiovascular:     Rate and Rhythm: Normal rate and regular rhythm.     Heart sounds: No murmur.  Pulmonary:     Effort: Pulmonary effort is normal.     Breath sounds: Normal breath sounds.  Musculoskeletal: Normal range of motion.  Skin:    General: Skin is warm and dry.  Neurological:     Mental Status: She is alert and oriented to person, place, and time.  Psychiatric:        Mood and Affect: Mood normal.        Behavior: Behavior normal.        Thought Content: Thought content normal.        Judgment: Judgment normal.     ASSESSMENT/PLAN:   1. Wolff-Parkinson-White (WPW) syndrome  2. Other iron deficiency anemia - Iron, TIBC and Ferritin Panel - CBC with Differential  3. Mild episode of recurrent major depressive disorder (HCC) - FLUoxetine (PROZAC) 20 MG capsule; Take 1 capsule (20 mg total) by mouth daily.  Dispense: 30 capsule; Refill: 3     Return in about 3 months (around 02/09/2019)  for iron def anemia.    The patient was given clear instructions to go to ER or return to medical center if symptoms do not improve, worsen or new problems develop. The patient verbalized understanding and agreed with plan of care.   Ms. Doug Sou. Nathaneil Canary, FNP-BC Patient [facility] [address] [place], [place] [address] [phone]

## 2018-11-10 LAB — CBC WITH DIFFERENTIAL/PLATELET
Basophils Absolute: 0.1 10*3/uL (ref 0.0–0.2)
Basos: 1 %
EOS (ABSOLUTE): 0.2 10*3/uL (ref 0.0–0.4)
Eos: 2 %
Hematocrit: 34.8 % (ref 34.0–46.6)
Hemoglobin: 10.9 g/dL — ABNORMAL LOW (ref 11.1–15.9)
Immature Grans (Abs): 0 10*3/uL (ref 0.0–0.1)
Immature Granulocytes: 0 %
Lymphocytes Absolute: 2.6 10*3/uL (ref 0.7–3.1)
Lymphs: 34 %
MCH: 26.7 pg (ref 26.6–33.0)
MCHC: 31.3 g/dL — ABNORMAL LOW (ref 31.5–35.7)
MCV: 85 fL (ref 79–97)
Monocytes Absolute: 0.3 10*3/uL (ref 0.1–0.9)
Monocytes: 5 %
Neutrophils Absolute: 4.4 10*3/uL (ref 1.4–7.0)
Neutrophils: 58 %
Platelets: 510 10*3/uL — ABNORMAL HIGH (ref 150–450)
RBC: 4.08 x10E6/uL (ref 3.77–5.28)
RDW: 15 % (ref 11.7–15.4)
WBC: 7.5 10*3/uL (ref 3.4–10.8)

## 2018-11-10 LAB — IRON,TIBC AND FERRITIN PANEL
Ferritin: 105 ng/mL (ref 15–150)
Iron Saturation: 12 % — ABNORMAL LOW (ref 15–55)
Iron: 28 ug/dL (ref 27–159)
Total Iron Binding Capacity: 230 ug/dL — ABNORMAL LOW (ref 250–450)
UIBC: 202 ug/dL (ref 131–425)

## 2018-12-01 ENCOUNTER — Encounter: Payer: Self-pay | Admitting: Family Medicine

## 2018-12-01 ENCOUNTER — Other Ambulatory Visit: Payer: Self-pay

## 2018-12-01 ENCOUNTER — Ambulatory Visit (HOSPITAL_COMMUNITY)
Admission: RE | Admit: 2018-12-01 | Discharge: 2018-12-01 | Disposition: A | Discharge status: Home / Self Care | Payer: Self-pay | Source: Ambulatory Visit | Attending: Family Medicine | Admitting: Family Medicine

## 2018-12-01 ENCOUNTER — Ambulatory Visit (INDEPENDENT_AMBULATORY_CARE_PROVIDER_SITE_OTHER): Payer: Self-pay | Admitting: Family Medicine

## 2018-12-01 VITALS — BP 111/68 | HR 101 | Temp 99.0°F | Ht 69.0 in | Wt 263.0 lb

## 2018-12-01 DIAGNOSIS — M7989 Other specified soft tissue disorders: Secondary | ICD-10-CM

## 2018-12-01 DIAGNOSIS — M25572 Pain in left ankle and joints of left foot: Secondary | ICD-10-CM

## 2018-12-01 DIAGNOSIS — D508 Other iron deficiency anemias: Secondary | ICD-10-CM

## 2018-12-01 MED ORDER — CLINDAMYCIN HCL 300 MG PO CAPS: 300.0000 mg | ORAL_CAPSULE | Freq: Three times a day (TID) | ORAL | 0 refills | Status: AC

## 2018-12-01 MED FILL — CLINDAMYCIN HCL 300 MG CAP: 300 | 10 days supply | Qty: 30 | Fill #0

## 2018-12-01 NOTE — Patient Instructions (Signed)

## 2018-12-01 NOTE — Progress Notes (Signed)
Patient [facility]   Progress Note: Sick Visit Provider: Lanae Boast, FNP  SUBJECTIVE:   Amanda Davenport is a 38 y.o. female who  has a past medical history of Allergy, Anemia, Arrhythmia, Chronic headache, Knee pain, Nearsightedness, Neuropathy, Obesity, Recurrent boils, and WPW (Wolff-Parkinson-White syndrome).. Patient presents today for follow up (having pain in lt hand, swelling, knee, shortness of breath )  Patient presents today with left handed swelling x 2 days. She denies injury to the area. States that her left leg is also swollen. She has a history of iron deficiency  anemia and is not taking oral iron. She states that she continues to have shortness of breath. Her last fereheme infusion was 05/25/2018.  Has a history of [place] and no note in system since 2015. She recently restarted taking gabapentin and states that it has helped with her pain in the past. She reports having to ambulate with a cane due to joint pain and swelling.  Review of Systems  Constitutional: Positive for malaise/fatigue.  HENT: Negative.   Eyes: Negative.   Respiratory: Positive for shortness of breath.   Cardiovascular: Negative.   Gastrointestinal: Negative.   Genitourinary: Negative.   Musculoskeletal: Positive for joint pain.  Skin: Negative.   Neurological: Negative.   Psychiatric/Behavioral: Negative.      OBJECTIVE: BP 111/68 (BP Location: Right Arm, Patient Position: Sitting, Cuff Size: Normal)   Pulse (!) 101   Temp 99 F (37.2 C) (Oral)   Ht 5\' 9"  (1.753 m)   Wt 263 lb (119.3 kg)   LMP 11/06/2018   BMI 38.84 kg/m   Wt Readings from Last 3 Encounters:  12/01/18 263 lb (119.3 kg)  11/09/18 266 lb (120.7 kg)  10/12/18 260 lb (117.9 kg)     Physical Exam Vitals signs and nursing note reviewed.  Constitutional:      General: She is not in acute distress.    Appearance: Normal appearance. She is obese.  HENT:     Head:  Normocephalic and atraumatic.  Eyes:     Extraocular Movements: Extraocular movements intact.     Conjunctiva/sclera: Conjunctivae normal.     Pupils: Pupils are equal, round, and reactive to light.  Cardiovascular:     Rate and Rhythm: Normal rate and regular rhythm.     Heart sounds: No murmur.  Pulmonary:     Effort: Pulmonary effort is normal.     Breath sounds: Normal breath sounds.  Musculoskeletal:     Left hand: She exhibits decreased range of motion, tenderness and swelling (there is swelling and erythema noted to the 3rd digit left hand. Tender with palpation. Decreased ROM due to swelling ).       Hands:     Left foot: Swelling present. No tenderness.       Feet:  Skin:    General: Skin is warm and dry.  Neurological:     Mental Status: She is alert and oriented to person, place, and time.  Psychiatric:        Mood and Affect: Mood normal.        Behavior: Behavior normal.        Thought Content: Thought content normal.        Judgment: Judgment normal.     ASSESSMENT/PLAN:   1. Swelling of left hand Suspect cellulitis as patient is susceptible to skin infections. Allergy to PCN and sulfa antibiotics. Will start clindamycin TID x 10 days.  - DG  Hand Complete Left; Future - clindamycin (CLEOCIN) 300 MG capsule; Take 1 capsule (300 mg total) by mouth 3 (three) times daily for 10 days.  Dispense: 30 capsule; Refill: 0  2. Other iron deficiency anemia Order placed for future appointment.  - ferumoxytol (FERAHEME) 510 mg in sodium chloride 0.9 % 100 mL IVPB  3. Arthralgia of left foot Continue with current regimen. RICE therapy recommended.     The patient was given clear instructions to go to ER or return to medical center if symptoms do not improve, worsen or new problems develop. The patient verbalized understanding and agreed with plan of care.   Ms. Doug Sou. Nathaneil Canary, FNP-BC Patient [facility] [address] [place],  [place] [address] [phone]     This note has been created with Dragon speech recognition software and smart phrase technology. Any transcriptional errors are unintentional.

## 2018-12-01 NOTE — Progress Notes (Signed)
No fracture or dislocation noted.

## 2018-12-02 ENCOUNTER — Emergency Department (HOSPITAL_COMMUNITY)
Admission: EM | Admit: 2018-12-02 | Discharge: 2018-12-02 | Disposition: A | Discharge status: Home / Self Care | Payer: Self-pay | Attending: Emergency Medicine | Admitting: Emergency Medicine

## 2018-12-02 ENCOUNTER — Other Ambulatory Visit: Payer: Self-pay

## 2018-12-02 ENCOUNTER — Encounter (HOSPITAL_COMMUNITY): Payer: Self-pay

## 2018-12-02 DIAGNOSIS — F17210 Nicotine dependence, cigarettes, uncomplicated: Secondary | ICD-10-CM | POA: Insufficient documentation

## 2018-12-02 DIAGNOSIS — F17290 Nicotine dependence, other tobacco product, uncomplicated: Secondary | ICD-10-CM | POA: Insufficient documentation

## 2018-12-02 DIAGNOSIS — M109 Gout, unspecified: Secondary | ICD-10-CM | POA: Insufficient documentation

## 2018-12-02 DIAGNOSIS — Z79899 Other long term (current) drug therapy: Secondary | ICD-10-CM | POA: Insufficient documentation

## 2018-12-02 MED ORDER — PREDNISONE 10 MG (21) PO TBPK: ORAL_TABLET | Freq: Every day | ORAL | 0 refills | Status: DC

## 2018-12-02 MED ORDER — OXYCODONE-ACETAMINOPHEN 5-325 MG PO TABS: 1.0000 | ORAL_TABLET | Freq: Three times a day (TID) | ORAL | 0 refills | Status: DC | PRN

## 2018-12-02 NOTE — ED Triage Notes (Signed)
Pt c/o throbbing L middle finger pain. Area is red and swollen. No injury. Got an Xray here yesterday, but does not know results.

## 2018-12-02 NOTE — Discharge Instructions (Addendum)
Thank you for allowing me to care for you today in the Emergency Department.   Take prednisone as prescribed.  Finish the entire course.  Take 650 mg of Tylenol or 600 mg of ibuprofen with food every 6 hours for pain.  You can alternate between these 2 medications every 3 hours if your pain returns.  For instance, you can take Tylenol at noon, followed by a dose of ibuprofen at 3, followed by second dose of Tylenol and 6.  For severe pain, you can take 1 tablet of Percocet every 8 hours.  Each tablet of Percocet contains 325 mg of Tylenol.  Did not take more than 4000 mg of Tylenol in a 24-hour period.  Do not work or drive while taking this medication because it causes you to be impaired.  Do not take other sedating substances, such as alcohol, while you are taking this medication.  I have sent the Percocet prescription electronically, but the prednisone requires a paper prescription at the pharmacy.  Please schedule follow-up appointment with primary care for recheck of your symptoms in the next week.  Return to the emergency department if you develop red streaking down the finger, high fevers, chills, if you start to have thick, mucus-like drainage from the digit, or other new, concerning symptoms.

## 2018-12-02 NOTE — ED Provider Notes (Signed)
[facility] DEPT Provider Note   CSN: [id number] Arrival date & time: 12/02/18  0516     History   Chief Complaint Chief Complaint  Patient presents with  . Hand Pain    L Middle Finger    HPI Amanda Davenport is a 38 y.o. female with history of WPW, neuropathy, and anemia who presents to the emergency department with a chief complaint of left third finger pain and swelling.  She reports that she has been having swelling to her left third finger for the last week.  She was seen by her PCP yesterday where she had an x-ray of the digit and was started on clindamycin for presumed cellulitis.  She reports that the pain in the joint has become unbearable and she has had to prop up her left arm to avoid hitting the finger.  She states that sometimes she props her arm onto her chair for so long that she starts to have some numbness and tingling in her fourth and fifth fingers that resolves as soon as she lifts up her arm and starts moving around.  She does also note that although she has had similar symptoms in her right ankle previously, that she also developed pain and swelling in her right ankle 1 week ago.  She states that since her ankle began hurting that she also had to start using her cane again to assist with ambulation.  No fevers or chills.  She denies any vaginal discharge, abdominal pain, urinary complaints.  Low concern for STIs.  She reports a history of gout and states that the pain in her left finger feels very similar to previous gout flare.  She reports that gout flare previously significantly resolved with prednisone.     The history is provided by the patient. No language interpreter was used.    Past Medical History:  Diagnosis Date  . Allergy   . Anemia    receives transfusions periodically  . Arrhythmia   . Chronic headache   . Knee pain   . Nearsightedness    wears glasses  . Neuropathy   . Obesity   . Recurrent boils   . WPW  (Wolff-Parkinson-White syndrome)     Patient Active Problem List   Diagnosis Date Noted  . Ovarian cyst 04/29/2018  . Facial cellulitis   . Otitis externa   . Dental abscess 11/27/2016  . 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 Absolute anemia 05/21/2015  . Neck strain 12/11/2014  . Hydradenitis 12/11/2014  . Dental caries 12/11/2014  . Constipation 12/03/2014  . Neck pain 12/03/2014  . Myalgia and myositis 11/27/2014  . 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 Morbid obesity ([address]) 11/27/2014  . Hematochezia 12/16/2012  . Wolff-Parkinson-White (WPW) syndrome 03/01/2012  . Iron deficiency anemia 02/16/2012  . Tachycardia 01/28/2012  . Hidradenitis suppurativa 08/18/2011  . AXILLARY ABSCESS 12/30/2006  . ANKLE PAIN 10/11/2006    Past Surgical History:  Procedure Laterality Date  . TONSILLECTOMY       OB History    Gravida  0   Para      Term      Preterm      AB      Living        SAB      TAB      Ectopic      Multiple      Live Births  Home Medications    Prior to Admission medications   Medication Sig Start Date End Date Taking? Authorizing Provider  acetaminophen (TYLENOL) 500 MG tablet Take 500 mg by mouth every 6 (six) hours as needed.    [provider]  Acetaminophen-Codeine (TYLENOL/CODEINE #3) 300-30 MG tablet Take 1 tablet by mouth every 4 (four) hours as needed for pain.    [provider]  chlorhexidine (HIBICLENS) 4 % external liquid Apply topically daily as needed. Patient not taking: Reported on 09/23/2017 06/23/17   [name], FNP  clindamycin (CLEOCIN) 300 MG capsule Take 1 capsule (300 mg total) by mouth 3 (three) times daily for 10 days. 12/01/18 12/11/18  Lanae Boast, FNP  docusate sodium (COLACE) 100 MG capsule Take 1 capsule (100 mg total) by mouth 2 (two) times daily. Patient not taking: Reported on 12/01/2018 11/09/18   Lanae Boast, FNP  FLUoxetine (PROZAC) 20 MG capsule Take 1  capsule (20 mg total) by mouth daily. Patient not taking: Reported on 12/01/2018 11/09/18   Lanae Boast, FNP  gabapentin (NEURONTIN) 300 MG capsule Take 1 capsule (300 mg total) by mouth 3 (three) times daily. 11/09/18   Lanae Boast, FNP  metoprolol tartrate (LOPRESSOR) 25 MG tablet Take 1 tablet (25 mg total) by mouth 2 (two) times daily. Patient not taking: Reported on 11/09/2018 07/29/18   Lanae Boast, FNP  omeprazole (PRILOSEC) 40 MG capsule Take 1 capsule (40 mg total) by mouth daily. Patient not taking: Reported on 11/09/2018 09/01/18   Lanae Boast, FNP  oxyCODONE-acetaminophen (PERCOCET/ROXICET) 5-325 MG tablet Take 1 tablet by mouth every 8 (eight) hours as needed for severe pain. 12/02/18   Evely Gainey A, PA-C  predniSONE (STERAPRED UNI-PAK 21 TAB) 10 MG (21) TBPK tablet Take by mouth daily. Take 6 tabs by mouth daily for 2 days, then 5 tabs for 2 days, then 4 tabs for 2 days, then 3 tabs for 2 days, 2 tabs for 2 days, then 1 tab by mouth daily for 2 days 12/02/18   Joline Maxcy A, PA-C    Family History Family History  Problem Relation Age of Onset  . Breast cancer Mother   . Pulmonary embolism Mother        died of PE  . Colon cancer Mother   . Irritable bowel syndrome Mother   . Cancer Mother   . Hypertension Father   . Diabetes Paternal Grandmother   . Heart disease Neg Hx   . Stroke Neg Hx     Social History Social History   Tobacco Use  . Smoking status: Current Some Day Smoker    Years: 0.50    Types: Cigarettes, Cigars  . Smokeless tobacco: Never Used  . Tobacco comment: smokes black and milds - last use early-mid August  Substance Use Topics  . Alcohol use: No  . Drug use: Yes    Types: Marijuana    Comment: 2+ times per month     Allergies   Other, Penicillins, Shellfish-derived products, Shrimp [shellfish allergy], and Sulfa antibiotics   Review of Systems Review of Systems  Constitutional: Negative for activity change, chills and fever.   Respiratory: Negative for shortness of breath.   Cardiovascular: Negative for chest pain.  Gastrointestinal: Negative for abdominal pain.  Genitourinary: Negative for dysuria.  Musculoskeletal: Positive for arthralgias, joint swelling and myalgias. Negative for back pain, gait problem, neck pain and neck stiffness.  Skin: Negative for rash.  Allergic/Immunologic: Negative for immunocompromised state.  Neurological: Negative for headaches.  Psychiatric/Behavioral: Negative for confusion.     Physical Exam Updated Vital Signs BP 120/72 (BP Location: Right Arm)   Pulse 82   Temp 98.3 F (36.8 C) (Oral)   Resp 18   LMP 11/06/2018   SpO2 98%   Physical Exam Vitals signs and nursing note reviewed.  Constitutional:      General: She is not in acute distress.    Appearance: She is obese. She is not ill-appearing or diaphoretic.  HENT:     Head: Normocephalic.  Eyes:     Conjunctiva/sclera: Conjunctivae normal.  Neck:     Musculoskeletal: Neck supple.  Cardiovascular:     Rate and Rhythm: Normal rate and regular rhythm.     Heart sounds: No murmur. No friction rub. No gallop.   Pulmonary:     Effort: Pulmonary effort is normal. No respiratory distress.  Abdominal:     General: There is no distension.     Palpations: Abdomen is soft.  Musculoskeletal:     Comments: Left third PIP joint is edematous and tender to palpation.  There is some purplish discoloration that is concerning for bruising, but no redness and minimal warmth.  DIP joint is unremarkable.  No tenderness palpation over the left third MCP.  Mild swelling and tenderness palpation with mild purplish discoloration over the right ankle.  No redness or warmth.  Skin:    General: Skin is warm.     Findings: No rash.  Neurological:     Mental Status: She is alert.  Psychiatric:        Behavior: Behavior normal.      ED Treatments / Results  Labs (all labs ordered are listed, but only abnormal results are  displayed) Labs Reviewed - No data to display  EKG None  Radiology Dg Hand Complete Left  Result Date: 12/01/2018 CLINICAL DATA:  Swelling third PIP.  Hand pain. EXAM: LEFT HAND - COMPLETE 3+ VIEW COMPARISON:  09/22/2013. FINDINGS: No acute bony or joint abnormality identified. No evidence of fracture or dislocation. IMPRESSION: No acute bony or joint abnormality. No evidence of fracture or dislocation. Electronically Signed   By: Marcello Moores  Register   On: 12/01/2018 12:12    Procedures Procedures (including critical care time)  Medications Ordered in ED Medications - No data to display   Initial Impression / Assessment and Plan / ED Course  I have reviewed the triage vital signs and the nursing notes.  Pertinent labs & imaging results that were available during my care of the patient were reviewed by me and considered in my medical decision making (see chart for details).        38 year old female with history of WPW, neuropathy, and anemia who presents to the emergency department with a primary complaint of left third PIP joint pain, swelling, and discoloration for the last week.  Per chart review, the patient has been seen previously on numerous occasions for arthropathy and numerous joints.  It appears that she has had an outpatient work-up for several rheumatological conditions with no positive findings.  X-ray ordered by PCP has been reviewed and is unremarkable.  Initially, it almost appears as if her left third PIP joint has a hemarthrosis, but this is not consistent with x-ray of the digit.  Low suspicion for cellulitis and septic joint.  Given that the patient remarks that the pain is similar to previous gout flare she has previously had resolution of other arthropathies with prednisone taper, I think it is reasonable to start  the patient on a prednisone taper for presumed gout and have her follow-up with primary care.  She is in agreement with this plan.  I will also discharge  her with a short course of pain medication.  She was given strict return precautions to the ER.  She is hemodynamically stable in no acute distress.  Safe for discharge home with outpatient follow-up.  Final Clinical Impressions(s) / ED Diagnoses   Final diagnoses:  Acute gout of left hand, unspecified cause    ED Discharge Orders         Ordered    predniSONE (STERAPRED UNI-PAK 21 TAB) 10 MG (21) TBPK tablet  Daily     12/02/18 0653    oxyCODONE-acetaminophen (PERCOCET/ROXICET) 5-325 MG tablet  Every 8 hours PRN     12/02/18 0653           Joanne Gavel, PA-C 12/02/18 0956    Mesner, Corene Cornea, MD 12/03/18 [id number]

## 2018-12-06 ENCOUNTER — Other Ambulatory Visit: Payer: Self-pay

## 2018-12-06 ENCOUNTER — Ambulatory Visit (HOSPITAL_COMMUNITY)
Admission: RE | Admit: 2018-12-06 | Discharge: 2018-12-06 | Disposition: A | Discharge status: Home / Self Care | Payer: Self-pay | Source: Ambulatory Visit | Attending: Internal Medicine | Admitting: Internal Medicine

## 2018-12-06 DIAGNOSIS — M109 Gout, unspecified: Secondary | ICD-10-CM

## 2018-12-06 DIAGNOSIS — D508 Other iron deficiency anemias: Secondary | ICD-10-CM | POA: Insufficient documentation

## 2018-12-06 HISTORY — DX: Gout, unspecified: M10.9

## 2018-12-06 MED ORDER — SODIUM CHLORIDE 0.9 % IV SOLN
INTRAVENOUS | Status: DC | PRN
  Administered 2018-12-06: 09:00:00 250 mL via INTRAVENOUS

## 2018-12-06 MED ORDER — SODIUM CHLORIDE 0.9 % IV SOLN
510.0000 mg | INTRAVENOUS | Status: DC
  Administered 2018-12-06: 09:00:00 510 mg via INTRAVENOUS
  Filled 2018-12-06: qty 17

## 2018-12-06 NOTE — Discharge Instructions (Signed)

## 2018-12-06 NOTE — Progress Notes (Signed)
Patient received Feraheme via PIV. Observed for at least 30 minutes post infusion.Tolerated well, vitals stable, discharge instructions given, verbalized understanding. Patient alert, oriented and ambulatory at the time of discharge.  

## 2018-12-14 ENCOUNTER — Encounter (HOSPITAL_COMMUNITY): Payer: Self-pay

## 2018-12-14 ENCOUNTER — Encounter (HOSPITAL_COMMUNITY): Payer: Self-pay | Admitting: *Deleted

## 2018-12-26 ENCOUNTER — Ambulatory Visit (INDEPENDENT_AMBULATORY_CARE_PROVIDER_SITE_OTHER): Payer: Self-pay | Admitting: Family Medicine

## 2018-12-26 ENCOUNTER — Other Ambulatory Visit: Payer: Self-pay

## 2018-12-26 VITALS — BP 120/72 | HR 92 | Temp 98.7°F | Ht 69.0 in | Wt 273.2 lb

## 2018-12-26 DIAGNOSIS — N390 Urinary tract infection, site not specified: Secondary | ICD-10-CM

## 2018-12-26 DIAGNOSIS — Z202 Contact with and (suspected) exposure to infections with a predominantly sexual mode of transmission: Secondary | ICD-10-CM

## 2018-12-26 DIAGNOSIS — R8290 Unspecified abnormal findings in urine: Secondary | ICD-10-CM

## 2018-12-26 DIAGNOSIS — Z09 Encounter for follow-up examination after completed treatment for conditions other than malignant neoplasm: Secondary | ICD-10-CM

## 2018-12-26 LAB — POCT URINALYSIS DIPSTICK
Bilirubin, UA: NEGATIVE
Glucose, UA: NEGATIVE
Ketones, UA: NEGATIVE
Nitrite, UA: NEGATIVE
Protein, UA: NEGATIVE
Spec Grav, UA: 1.025 (ref 1.010–1.025)
Urobilinogen, UA: 0.2 E.U./dL
pH, UA: 5.5 (ref 5.0–8.0)

## 2018-12-26 MED ORDER — NITROFURANTOIN MONOHYD MACRO 100 MG PO CAPS: 100.0000 mg | ORAL_CAPSULE | Freq: Two times a day (BID) | ORAL | 0 refills | Status: DC

## 2018-12-26 MED FILL — NITROFURANTOIN MONO-MCR 100: 100 | 7 days supply | Qty: 14 | Fill #0

## 2018-12-26 NOTE — Progress Notes (Signed)
[facility]  Sick Visit  Subjective:  Patient ID: Amanda Davenport, female    DOB: 04/08/1980  Age: 38 y.o. MRN: [id number]  CC:  Chief Complaint  Patient presents with  . Follow-up    std  testing, discuss medication wants something stronger     HPI Amanda Davenport is a 38 year old female who presents for Sick Visit today.   Past Medical History:  Diagnosis Date  . Allergy   . Anemia    receives transfusions periodically  . Arrhythmia   . Chronic headache   . Knee pain   . Nearsightedness    wears glasses  . Neuropathy   . Obesity   . Recurrent boils   . WPW (Wolff-Parkinson-White syndrome)    Current Status: Since her last office visit, she has c/o of possible exposure to STD and would like to be tested today. He anxiety is increased today r/t this issue. She denies any symptoms. She denies suicidal ideations, homicidal ideations, or auditory hallucinations. He denies fevers, chills, fatigue, recent infections, weight loss, and night sweats. He has not had any headaches, visual changes, dizziness, and falls. No chest pain, heart palpitations, cough and shortness of breath reported. No reports of GI problems such as nausea, vomiting, diarrhea, and constipation. He has no reports of blood in stools, dysuria and hematuria. He denies pain today.   Past Surgical History:  Procedure Laterality Date  . TONSILLECTOMY      Family History  Problem Relation Age of Onset  . Breast cancer Mother   . Pulmonary embolism Mother        died of PE  . Colon cancer Mother   . Irritable bowel syndrome Mother   . Cancer Mother   . Hypertension Father   . Diabetes Paternal Grandmother   . Heart disease Neg Hx   . Stroke Neg Hx     Social History   Socioeconomic History  . Marital status: Single    Spouse name: Not on file  . Number of children: Not on file  . Years of education: Not on file  . Highest education level: Not on file   Occupational History  . Not on file  Social Needs  . Financial resource strain: Not on file  . Food insecurity    Worry: Not on file    Inability: Not on file  . Transportation needs    Medical: Not on file    Non-medical: Not on file  Tobacco Use  . Smoking status: Current Some Day Smoker    Years: 0.50    Types: Cigarettes, Cigars  . Smokeless tobacco: Never Used  . Tobacco comment: smokes black and milds - last use early-mid August  Substance and Sexual Activity  . Alcohol use: No  . Drug use: Yes    Types: Marijuana    Comment: 2+ times per month  . Sexual activity: Yes    Birth control/protection: None, Condom  Lifestyle  . Physical activity    Days per week: Not on file    Minutes per session: Not on file  . Stress: Not on file  Relationships  . Social Herbalist on phone: Not on file    Gets together: Not on file    Attends religious service: Not on file    Active member of club or organization: Not on file    Attends meetings of clubs or organizations: Not on file  Relationship status: Not on file  . Intimate partner violence    Fear of current or ex partner: Not on file    Emotionally abused: Not on file    Physically abused: Not on file    Forced sexual activity: Not on file  Other Topics Concern  . Not on file  Social History Narrative  . Not on file    Outpatient Medications Prior to Visit  Medication Sig Dispense Refill  . Acetaminophen-Codeine (TYLENOL/CODEINE #3) 300-30 MG tablet Take 1 tablet by mouth every 4 (four) hours as needed for pain.    Marland Kitchen FLUoxetine (PROZAC) 20 MG capsule Take 1 capsule (20 mg total) by mouth daily. 30 capsule 3  . gabapentin (NEURONTIN) 300 MG capsule Take 1 capsule (300 mg total) by mouth 3 (three) times daily. 90 capsule 3  . metoprolol tartrate (LOPRESSOR) 25 MG tablet Take 1 tablet (25 mg total) by mouth 2 (two) times daily. 60 tablet 0  . acetaminophen (TYLENOL) 500 MG tablet Take 500 mg by mouth every 6  (six) hours as needed.    . chlorhexidine (HIBICLENS) 4 % external liquid Apply topically daily as needed. (Patient not taking: Reported on 09/23/2017) 120 mL 5  . docusate sodium (COLACE) 100 MG capsule Take 1 capsule (100 mg total) by mouth 2 (two) times daily. (Patient not taking: Reported on 12/01/2018) 10 capsule 0  . omeprazole (PRILOSEC) 40 MG capsule Take 1 capsule (40 mg total) by mouth daily. (Patient not taking: Reported on 11/09/2018) 30 capsule 3  . predniSONE (STERAPRED UNI-PAK 21 TAB) 10 MG (21) TBPK tablet Take by mouth daily. Take 6 tabs by mouth daily for 2 days, then 5 tabs for 2 days, then 4 tabs for 2 days, then 3 tabs for 2 days, 2 tabs for 2 days, then 1 tab by mouth daily for 2 days (Patient not taking: Reported on 12/26/2018) 42 tablet 0  . oxyCODONE-acetaminophen (PERCOCET/ROXICET) 5-325 MG tablet Take 1 tablet by mouth every 8 (eight) hours as needed for severe pain. (Patient not taking: Reported on 12/26/2018) 15 tablet 0   No facility-administered medications prior to visit.     Allergies  Allergen Reactions  . Other Other (See Comments)    All Antibiotics cause severe vaginal yeast infections  . Penicillins Hives, Itching and Swelling    Has patient had a PCN reaction causing immediate rash, facial/tongue/throat swelling, SOB or lightheadedness with hypotension: Yes Has patient had a PCN reaction causing severe rash involving mucus membranes or skin necrosis: Yes Has patient had a PCN reaction that required hospitalization Yes Has patient had a PCN reaction occurring within the last 10 years: Yes If all of the above answers are "NO", then may proceed with Cephalosporin use.   . Shellfish-Derived Products Hives  . Shrimp [Shellfish Allergy] Hives  . Sulfa Antibiotics Rash    ROS Review of Systems  Constitutional: Negative.   HENT: Negative.   Eyes: Negative.   Respiratory: Negative.   Cardiovascular: Negative.   Gastrointestinal: Positive for abdominal  distention.  Endocrine: Negative.   Genitourinary: Negative.   Musculoskeletal: Negative.   Skin: Negative.   Allergic/Immunologic: Negative.   Neurological: Negative.   Hematological: Negative.   Psychiatric/Behavioral: The patient is nervous/anxious.       Objective:    Physical Exam  Constitutional: She is oriented to person, place, and time. She appears well-developed and well-nourished.  HENT:  Head: Normocephalic and atraumatic.  Eyes: Conjunctivae are normal.  Neck: Normal range of motion.  Neck supple.  Cardiovascular: Normal rate, regular rhythm, normal heart sounds and intact distal pulses.  Pulmonary/Chest: Effort normal and breath sounds normal.  Abdominal: Soft. Bowel sounds are normal. She exhibits distension (obese).  Musculoskeletal: Normal range of motion.  Neurological: She is alert and oriented to person, place, and time. She has normal reflexes.  Skin: Skin is warm and dry.  Psychiatric: She has a normal mood and affect. Her behavior is normal. Judgment and thought content normal.  Nursing note and vitals reviewed.   BP 120/72 (BP Location: Left Arm, Patient Position: Sitting, Cuff Size: Normal)   Pulse 92   Temp 98.7 F (37.1 C) (Oral)   Ht 5' 9"  (1.753 m)   Wt 273 lb 3.2 oz (123.9 kg)   LMP 12/07/2018   SpO2 95%   BMI 40.34 kg/m  Wt Readings from Last 3 Encounters:  12/26/18 273 lb 3.2 oz (123.9 kg)  12/01/18 263 lb (119.3 kg)  11/09/18 266 lb (120.7 kg)     There are no preventive care reminders to display for this patient.  There are no preventive care reminders to display for this patient.  Lab Results  Component Value Date   TSH 1.620 11/24/2017   Lab Results  Component Value Date   WBC 7.5 11/09/2018   HGB 10.9 (L) 11/09/2018   HCT 34.8 11/09/2018   MCV 85 11/09/2018   PLT 510 (H) 11/09/2018   Lab Results  Component Value Date   NA 140 08/01/2018   K 4.0 08/01/2018   CHLORIDE 109 12/18/2014   CO2 21 08/01/2018   GLUCOSE 87  08/01/2018   BUN 7 08/01/2018   CREATININE 0.77 08/01/2018   BILITOT 0.2 08/01/2018   ALKPHOS 75 08/01/2018   AST 12 08/01/2018   ALT 7 08/01/2018   PROT 7.8 08/01/2018   ALBUMIN 3.3 (L) 08/01/2018   CALCIUM 8.7 08/01/2018   ANIONGAP 8 05/10/2018   EGFR >90 12/18/2014   No results found for: CHOL No results found for: HDL No results found for: LDLCALC No results found for: TRIG No results found for: [facility] Lab Results  Component Value Date   HGBA1C 5.3 03/31/2017   Assessment & Plan:   1. Possible exposure to STD - HepB+HepC+HIV Panel - NuSwab Vaginitis Plus (VG+)  2. Urinary tract infection without hematuria, site unspecified We will initiate antibiotic today.  - nitrofurantoin, macrocrystal-monohydrate, (MACROBID) 100 MG capsule; Take 1 capsule (100 mg total) by mouth 2 (two) times daily.  Dispense: 14 capsule; Refill: 0  3. Abnormal urine - POCT Urinalysis Dipstick  4. Abnormal urinalysis Results are pending.  - Urine Culture  5. Follow up She will follow up to discuss other healthcare issues.   Meds ordered this encounter  Medications  . nitrofurantoin, macrocrystal-monohydrate, (MACROBID) 100 MG capsule    Sig: Take 1 capsule (100 mg total) by mouth 2 (two) times daily.    Dispense:  14 capsule    Refill:  0    Orders Placed This Encounter  Procedures  . Urine Culture  . HepB+HepC+HIV Panel  . NuSwab Vaginitis Plus (VG+)  . POCT Urinalysis Dipstick    Referral Orders  No referral(s) requested today    Kathe Becton,  MSN, FNP-BC [facility] [address] [place], [place] [address] [phone] fax   Problem List Items Addressed This Visit    None    Visit Diagnoses    Possible exposure to STD    -  Primary   Relevant Orders   HepB+HepC+HIV Panel   NuSwab Vaginitis Plus (VG+)   Urinary tract infection without hematuria, site unspecified       Relevant  Medications   nitrofurantoin, macrocrystal-monohydrate, (MACROBID) 100 MG capsule   Abnormal urine       Relevant Orders   POCT Urinalysis Dipstick (Completed)   Abnormal urinalysis       Relevant Orders   Urine Culture   Follow up          Meds ordered this encounter  Medications  . nitrofurantoin, macrocrystal-monohydrate, (MACROBID) 100 MG capsule    Sig: Take 1 capsule (100 mg total) by mouth 2 (two) times daily.    Dispense:  14 capsule    Refill:  0    Follow-up: No follow-ups on file.    Azzie Glatter, FNP

## 2018-12-27 ENCOUNTER — Encounter: Payer: Self-pay | Admitting: Family Medicine

## 2018-12-27 ENCOUNTER — Ambulatory Visit (INDEPENDENT_AMBULATORY_CARE_PROVIDER_SITE_OTHER): Payer: Self-pay | Admitting: Family Medicine

## 2018-12-27 VITALS — BP 105/55 | HR 97 | Ht 69.0 in | Wt 274.0 lb

## 2018-12-27 DIAGNOSIS — G629 Polyneuropathy, unspecified: Secondary | ICD-10-CM | POA: Insufficient documentation

## 2018-12-27 DIAGNOSIS — L732 Hidradenitis suppurativa: Secondary | ICD-10-CM

## 2018-12-27 DIAGNOSIS — Z09 Encounter for follow-up examination after completed treatment for conditions other than malignant neoplasm: Secondary | ICD-10-CM

## 2018-12-27 DIAGNOSIS — G8929 Other chronic pain: Secondary | ICD-10-CM

## 2018-12-27 LAB — HEPB+HEPC+HIV PANEL
HIV Screen 4th Generation wRfx: NONREACTIVE
Hep B C IgM: NEGATIVE
Hep B Core Total Ab: NEGATIVE
Hep B E Ab: NEGATIVE
Hep B E Ag: NEGATIVE
Hep B Surface Ab, Qual: NONREACTIVE
Hep C Virus Ab: 1.5 s/co ratio — ABNORMAL HIGH (ref 0.0–0.9)
Hepatitis B Surface Ag: NEGATIVE

## 2018-12-27 MED ORDER — CHLORHEXIDINE GLUCONATE 4 % EX LIQD: Freq: Every day | CUTANEOUS | 5 refills | Status: DC | PRN

## 2018-12-27 MED ORDER — GABAPENTIN 300 MG PO CAPS: ORAL_CAPSULE | ORAL | 3 refills | Status: DC

## 2018-12-27 MED ORDER — DOXYCYCLINE HYCLATE 100 MG PO TABS: 100.0000 mg | ORAL_TABLET | Freq: Two times a day (BID) | ORAL | 0 refills | Status: DC

## 2018-12-27 NOTE — Progress Notes (Signed)
Patient [facility]   Established Patient Office Visit  Subjective:  Patient ID: Amanda Davenport, female    DOB: 05/04/1980  Age: 38 y.o. MRN: [id number]  CC:  Chief Complaint  Patient presents with  . Follow-up    diacuss medication     HPI AILEENE LANUM is a 37 year old female who presents for Follow Up today.   Past Medical History:  Diagnosis Date  . Allergy   . Anemia    receives transfusions periodically  . Arrhythmia   . Chronic headache   . Knee pain   . Nearsightedness    wears glasses  . Neuropathy   . Obesity   . Recurrent boils   . WPW (Wolff-Parkinson-White syndrome)    Current Status: Ms. Mccubbins is a former patient of Lanae Boast, NP. Since her last office visit, she is doing well with no complaints. She has c/o increased generalized pain. She has a history of Chronic Hydradenitis Suppurativa, especially located in left axilla area. She is keeping area clean using gauze dressings. Her anxiety is moderate today r/t her health issues. She is currently taking Fluoxetine as prescribed. She denies suicidal ideations, homicidal ideations, or auditory hallucinations. She denies fevers, chills, fatigue, recent infections, weight loss, and night sweats. She has not had any headaches, visual changes, dizziness, and falls. No chest pain, heart palpitations, cough and shortness of breath reported. No reports of GI problems such as nausea, vomiting, diarrhea, and constipation. She has no reports of blood in stools, dysuria and hematuria.    Past Surgical History:  Procedure Laterality Date  . TONSILLECTOMY      Family History  Problem Relation Age of Onset  . Breast cancer Mother   . Pulmonary embolism Mother        died of PE  . Colon cancer Mother   . Irritable bowel syndrome Mother   . Cancer Mother   . Hypertension Father   . Diabetes Paternal Grandmother   . Heart disease Neg Hx   . Stroke Neg Hx     Social  History   Socioeconomic History  . Marital status: Single    Spouse name: Not on file  . Number of children: Not on file  . Years of education: Not on file  . Highest education level: Not on file  Occupational History  . Not on file  Social Needs  . Financial resource strain: Not on file  . Food insecurity    Worry: Not on file    Inability: Not on file  . Transportation needs    Medical: Not on file    Non-medical: Not on file  Tobacco Use  . Smoking status: Current Some Day Smoker    Years: 0.50    Types: Cigarettes, Cigars  . Smokeless tobacco: Never Used  . Tobacco comment: smokes black and milds - last use early-mid August  Substance and Sexual Activity  . Alcohol use: No  . Drug use: Yes    Types: Marijuana    Comment: 2+ times per month  . Sexual activity: Yes    Birth control/protection: None, Condom  Lifestyle  . Physical activity    Days per week: Not on file    Minutes per session: Not on file  . Stress: Not on file  Relationships  . Social Herbalist on phone: Not on file    Gets together: Not on file    Attends religious  service: Not on file    Active member of club or organization: Not on file    Attends meetings of clubs or organizations: Not on file    Relationship status: Not on file  . Intimate partner violence    Fear of current or ex partner: Not on file    Emotionally abused: Not on file    Physically abused: Not on file    Forced sexual activity: Not on file  Other Topics Concern  . Not on file  Social History Narrative  . Not on file    Outpatient Medications Prior to Visit  Medication Sig Dispense Refill  . FLUoxetine (PROZAC) 20 MG capsule Take 1 capsule (20 mg total) by mouth daily. 30 capsule 3  . nitrofurantoin, macrocrystal-monohydrate, (MACROBID) 100 MG capsule Take 1 capsule (100 mg total) by mouth 2 (two) times daily. 14 capsule 0  . gabapentin (NEURONTIN) 300 MG capsule Take 1 capsule (300 mg total) by mouth 3  (three) times daily. 90 capsule 3  . metoprolol tartrate (LOPRESSOR) 25 MG tablet Take 1 tablet (25 mg total) by mouth 2 (two) times daily. 60 tablet 0  . acetaminophen (TYLENOL) 500 MG tablet Take 500 mg by mouth every 6 (six) hours as needed.    . Acetaminophen-Codeine (TYLENOL/CODEINE #3) 300-30 MG tablet Take 1 tablet by mouth every 4 (four) hours as needed for pain.    Marland Kitchen omeprazole (PRILOSEC) 40 MG capsule Take 1 capsule (40 mg total) by mouth daily. (Patient not taking: Reported on 11/09/2018) 30 capsule 3  . chlorhexidine (HIBICLENS) 4 % external liquid Apply topically daily as needed. (Patient not taking: Reported on 09/23/2017) 120 mL 5  . docusate sodium (COLACE) 100 MG capsule Take 1 capsule (100 mg total) by mouth 2 (two) times daily. (Patient not taking: Reported on 12/01/2018) 10 capsule 0  . predniSONE (STERAPRED UNI-PAK 21 TAB) 10 MG (21) TBPK tablet Take by mouth daily. Take 6 tabs by mouth daily for 2 days, then 5 tabs for 2 days, then 4 tabs for 2 days, then 3 tabs for 2 days, 2 tabs for 2 days, then 1 tab by mouth daily for 2 days (Patient not taking: Reported on 12/26/2018) 42 tablet 0   No facility-administered medications prior to visit.     Allergies  Allergen Reactions  . Other Other (See Comments)    All Antibiotics cause severe vaginal yeast infections  . Penicillins Hives, Itching and Swelling    Has patient had a PCN reaction causing immediate rash, facial/tongue/throat swelling, SOB or lightheadedness with hypotension: Yes Has patient had a PCN reaction causing severe rash involving mucus membranes or skin necrosis: Yes Has patient had a PCN reaction that required hospitalization Yes Has patient had a PCN reaction occurring within the last 10 years: Yes If all of the above answers are "NO", then may proceed with Cephalosporin use.   . Shellfish-Derived Products Hives  . Shrimp [Shellfish Allergy] Hives  . Sulfa Antibiotics Rash    ROS Review of Systems  Eyes:  Negative.   Respiratory: Negative.   Cardiovascular: Negative.   Gastrointestinal: Negative.   Endocrine: Negative.   Genitourinary: Negative.   Musculoskeletal: Positive for arthralgias (Generalized chronic pain).  Skin: Positive for rash.  Allergic/Immunologic: Negative.   Neurological: Negative.   Hematological: Negative.   Psychiatric/Behavioral: Negative.       Objective:    Physical Exam  Constitutional: She is oriented to person, place, and time. She appears well-developed and well-nourished.  HENT:  Head: Normocephalic and atraumatic.  Eyes: Conjunctivae are normal.  Neck: Normal range of motion. Neck supple.  Cardiovascular: Normal rate, regular rhythm, normal heart sounds and intact distal pulses.  Pulmonary/Chest: Effort normal and breath sounds normal.    Abdominal: Soft. Bowel sounds are normal. She exhibits distension (obese).  Musculoskeletal: Normal range of motion.  Neurological: She is alert and oriented to person, place, and time. She has normal reflexes.  Skin: Skin is warm and dry.  Psychiatric: She has a normal mood and affect. Her behavior is normal. Judgment and thought content normal.  Nursing note and vitals reviewed.   BP (!) 105/55 (BP Location: Right Arm, Patient Position: Sitting, Cuff Size: Normal)   Pulse 97   Ht _0  (1.753 m)   Wt 274 lb (124.3 kg)   LMP 12/07/2018   SpO2 100%   BMI 40.46 kg/m  Wt Readings from Last 3 Encounters:  12/27/18 274 lb (124.3 kg)  12/26/18 273 lb 3.2 oz (123.9 kg)  12/01/18 263 lb (119.3 kg)     There are no preventive care reminders to display for this patient.  There are no preventive care reminders to display for this patient.  Lab Results  Component Value Date   TSH 1.620 11/24/2017   Lab Results  Component Value Date   WBC 7.5 11/09/2018   HGB 10.9 (L) 11/09/2018   HCT 34.8 11/09/2018   MCV 85 11/09/2018   PLT 510 (H) 11/09/2018   Lab Results  Component Value Date   NA 140  08/01/2018   K 4.0 08/01/2018   CHLORIDE 109 12/18/2014   CO2 21 08/01/2018   GLUCOSE 87 08/01/2018   BUN 7 08/01/2018   CREATININE 0.77 08/01/2018   BILITOT 0.2 08/01/2018   ALKPHOS 75 08/01/2018   AST 12 08/01/2018   ALT 7 08/01/2018   PROT 7.8 08/01/2018   ALBUMIN 3.3 (L) 08/01/2018   CALCIUM 8.7 08/01/2018   ANIONGAP 8 05/10/2018   EGFR >90 12/18/2014   No results found for: CHOL No results found for: HDL No results found for: LDLCALC No results found for: TRIG No results found for: [facility] Lab Results  Component Value Date   HGBA1C 5.3 03/31/2017   Assessment & Plan:   1. Neuropathy Dosage of Gabapentin increased today. We have referred her to Pain management. Continue to monitor.  - gabapentin (NEURONTIN) 300 MG capsule; Take 2 capsules (total = 600 mg), by mouth, 3 times daily.  Dispense: 180 capsule; Refill: 3  2. Other chronic pain - Ambulatory referral to Pain Clinic  3. Hidradenitis suppurativa We will initiate antibiotic today. - chlorhexidine (HIBICLENS) 4 % external liquid; Apply topically daily as needed.  Dispense: 120 mL; Refill: 5 - doxycycline (VIBRA-TABS) 100 MG tablet; Take 1 tablet (100 mg total) by mouth 2 (two) times daily.  Dispense: 20 tablet; Refill: 0 - Ambulatory referral to Infectious Disease  4. Follow up She will follow up in 3 months.  Meds ordered this encounter  Medications  . gabapentin (NEURONTIN) 300 MG capsule    Sig: Take 2 capsules (total = 600 mg), by mouth, 3 times daily.    Dispense:  180 capsule    Refill:  3  . chlorhexidine (HIBICLENS) 4 % external liquid    Sig: Apply topically daily as needed.    Dispense:  120 mL    Refill:  5  . doxycycline (VIBRA-TABS) 100 MG tablet    Sig: Take 1 tablet (100 mg total) by mouth 2 (  two) times daily.    Dispense:  20 tablet    Refill:  0    Orders Placed This Encounter  Procedures  . Ambulatory referral to Pain Clinic  . Ambulatory referral to Infectious Disease      Referral Orders     Ambulatory referral to Pain Clinic     Ambulatory referral to Infectious Disease   Kathe Becton,  MSN, FNP-BC [facility] [address] [place], [place] [address] [phone] fax   Problem List Items Addressed This Visit      Musculoskeletal and Integument   Hidradenitis suppurativa   Relevant Medications   chlorhexidine (HIBICLENS) 4 % external liquid   doxycycline (VIBRA-TABS) 100 MG tablet   Other Relevant Orders   Ambulatory referral to Infectious Disease    Other Visit Diagnoses    Neuropathy    -  Primary   Relevant Medications   gabapentin (NEURONTIN) 300 MG capsule   Other chronic pain       Relevant Medications   gabapentin (NEURONTIN) 300 MG capsule   Other Relevant Orders   Ambulatory referral to Pain Clinic   Follow up          Meds ordered this encounter  Medications  . gabapentin (NEURONTIN) 300 MG capsule    Sig: Take 2 capsules (total = 600 mg), by mouth, 3 times daily.    Dispense:  180 capsule    Refill:  3  . chlorhexidine (HIBICLENS) 4 % external liquid    Sig: Apply topically daily as needed.    Dispense:  120 mL    Refill:  5  . doxycycline (VIBRA-TABS) 100 MG tablet    Sig: Take 1 tablet (100 mg total) by mouth 2 (two) times daily.    Dispense:  20 tablet    Refill:  0    Follow-up: Return in about 3 months (around 03/28/2019).    Azzie Glatter, FNP

## 2018-12-28 ENCOUNTER — Encounter: Payer: Self-pay | Admitting: Internal Medicine

## 2018-12-28 ENCOUNTER — Ambulatory Visit (INDEPENDENT_AMBULATORY_CARE_PROVIDER_SITE_OTHER): Payer: Self-pay | Admitting: Internal Medicine

## 2018-12-28 ENCOUNTER — Other Ambulatory Visit: Payer: Self-pay

## 2018-12-28 VITALS — BP 103/67 | HR 93 | Temp 99.3°F

## 2018-12-28 DIAGNOSIS — L732 Hidradenitis suppurativa: Secondary | ICD-10-CM

## 2018-12-28 LAB — URINE CULTURE

## 2018-12-28 NOTE — Patient Instructions (Signed)
Call us back if you are not able to be seen by dr Marla Roe. We can try to get back to [facility]

## 2018-12-28 NOTE — Progress Notes (Signed)
RFV: follow up new patient for hydradinitis superativa  Patient ID: Amanda Davenport, female   DOB: 1980/07/13, 38 y.o.   MRN: [id number]  HPI Amanda Davenport is a 38yo F with hx of hydradinitis that we had seen roughly a 18 months ago. Recently seen by pcp for flare, started on doxycycline.  She reports that her left > right axilla is mostly affected with whitish drainage. She also states that her  groin also affected. She has not seen anyone at [facility], or had difficulty with getting access to clinic Outpatient Encounter Medications as of 12/28/2018  Medication Sig  . FLUoxetine (PROZAC) 20 MG capsule Take 1 capsule (20 mg total) by mouth daily.  Marland Kitchen gabapentin (NEURONTIN) 300 MG capsule Take 2 capsules (total = 600 mg), by mouth, 3 times daily.  Marland Kitchen acetaminophen (TYLENOL) 500 MG tablet Take 500 mg by mouth every 6 (six) hours as needed.  . Acetaminophen-Codeine (TYLENOL/CODEINE #3) 300-30 MG tablet Take 1 tablet by mouth every 4 (four) hours as needed for pain.  . chlorhexidine (HIBICLENS) 4 % external liquid Apply topically daily as needed. (Patient not taking: Reported on 12/28/2018)  . doxycycline (VIBRA-TABS) 100 MG tablet Take 1 tablet (100 mg total) by mouth 2 (two) times daily. (Patient not taking: Reported on 12/28/2018)  . nitrofurantoin, macrocrystal-monohydrate, (MACROBID) 100 MG capsule Take 1 capsule (100 mg total) by mouth 2 (two) times daily. (Patient not taking: Reported on 12/28/2018)  . omeprazole (PRILOSEC) 40 MG capsule Take 1 capsule (40 mg total) by mouth daily. (Patient not taking: Reported on 12/28/2018)   No facility-administered encounter medications on file as of 12/28/2018.      Patient Active Problem List   Diagnosis Date Noted  . Neuropathy 12/27/2018  . Ovarian cyst 04/29/2018  . Facial cellulitis   . Otitis externa   . Dental abscess 11/27/2016  . 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 Absolute anemia 05/21/2015  . Neck strain 12/11/2014  . Hydradenitis  12/11/2014  . Dental caries 12/11/2014  . Constipation 12/03/2014  . Neck pain 12/03/2014  . Myalgia and myositis 11/27/2014  . 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 Morbid obesity ([place]) 11/27/2014  . Hematochezia 12/16/2012  . Wolff-Parkinson-White (WPW) syndrome 03/01/2012  . Iron deficiency anemia 02/16/2012  . Tachycardia 01/28/2012  . Hidradenitis suppurativa 08/18/2011  . AXILLARY ABSCESS 12/30/2006  . ANKLE PAIN 10/11/2006     There are no preventive care reminders to display for this patient.  Social History   Tobacco Use  . Smoking status: Current Some Day Smoker    Years: 0.50    Types: Cigarettes, Cigars  . Smokeless tobacco: Never Used  . Tobacco comment: smokes black and milds - last use early-mid August  Substance Use Topics  . Alcohol use: No  . Drug use: Yes    Types: Marijuana    Comment: 2+ times per month  family history includes Breast cancer in her mother; Cancer in her mother; Colon cancer in her mother; Diabetes in her paternal grandmother; Hypertension in her father; Irritable bowel syndrome in her mother; Pulmonary embolism in her mother. Review of Systems +abn skin/wound lesions, otherwise 12 point ros is negative Physical Exam   BP 103/67   Pulse 93   Temp 99.3 F (37.4 C)   LMP 12/28/2018 (Exact Date)   gen = a xo by 3 in nad Skin = axilla exam reveals Drainage from right, sinus tract. Keloid scaring to both. No surrounding erythema Lab Results  Component Value Date  LABRPR Non Reactive 04/29/2018    CBC Lab Results  Component Value Date   WBC 7.5 11/09/2018   RBC 4.08 11/09/2018   HGB 10.9 (L) 11/09/2018   HCT 34.8 11/09/2018   PLT 510 (H) 11/09/2018   MCV 85 11/09/2018   MCH 26.7 11/09/2018   MCHC 31.3 (L) 11/09/2018   RDW 15.0 11/09/2018   LYMPHSABS 2.6 11/09/2018   MONOABS 1.0 03/13/2018   EOSABS 0.2 11/09/2018    BMET Lab Results  Component Value Date   NA 140 08/01/2018   K 4.0 08/01/2018   CL 106 08/01/2018    CO2 21 08/01/2018   GLUCOSE 87 08/01/2018   BUN 7 08/01/2018   CREATININE 0.77 08/01/2018   CALCIUM 8.7 08/01/2018   GFRNONAA 99 08/01/2018   GFRAA 114 08/01/2018      Assessment and Plan hydradinitis suppurativa = unsure if that doxycycline chroncally will help. She would benefit from evaluation to plastic surgery, unclear patients insurance coverage. Continue to keep area dry, redress once-twice a day. Avoid deoderants to the affected area  Refer to dr Marla Roe from plastic surgery vs [facility] to see if any recommendations

## 2018-12-29 ENCOUNTER — Other Ambulatory Visit: Payer: Self-pay | Admitting: Family Medicine

## 2018-12-29 DIAGNOSIS — A599 Trichomoniasis, unspecified: Secondary | ICD-10-CM

## 2018-12-29 LAB — NUSWAB VAGINITIS PLUS (VG+)
Candida albicans, NAA: NEGATIVE
Candida glabrata, NAA: NEGATIVE
Chlamydia trachomatis, NAA: NEGATIVE
Neisseria gonorrhoeae, NAA: NEGATIVE
Trich vag by NAA: POSITIVE — AB

## 2018-12-29 MED ORDER — METRONIDAZOLE 500 MG PO TABS: ORAL_TABLET | ORAL | 0 refills | Status: DC

## 2018-12-29 MED FILL — ?METRONIDAZOLE 500 MG TABS: 500 | 1 days supply | Qty: 4 | Fill #0

## 2019-01-05 ENCOUNTER — Other Ambulatory Visit: Payer: Self-pay

## 2019-01-05 ENCOUNTER — Other Ambulatory Visit: Payer: Self-pay | Admitting: Family Medicine

## 2019-01-05 DIAGNOSIS — Z202 Contact with and (suspected) exposure to infections with a predominantly sexual mode of transmission: Secondary | ICD-10-CM

## 2019-01-06 ENCOUNTER — Ambulatory Visit: Payer: Self-pay | Admitting: Family Medicine

## 2019-01-06 ENCOUNTER — Other Ambulatory Visit: Payer: Self-pay

## 2019-01-06 ENCOUNTER — Encounter: Payer: Self-pay | Admitting: Family Medicine

## 2019-01-06 ENCOUNTER — Ambulatory Visit (INDEPENDENT_AMBULATORY_CARE_PROVIDER_SITE_OTHER): Payer: Self-pay | Admitting: Family Medicine

## 2019-01-06 VITALS — BP 100/64 | HR 100 | Temp 98.6°F | Ht 69.0 in | Wt 270.8 lb

## 2019-01-06 DIAGNOSIS — M109 Gout, unspecified: Secondary | ICD-10-CM

## 2019-01-06 DIAGNOSIS — Z202 Contact with and (suspected) exposure to infections with a predominantly sexual mode of transmission: Secondary | ICD-10-CM

## 2019-01-06 DIAGNOSIS — R8290 Unspecified abnormal findings in urine: Secondary | ICD-10-CM

## 2019-01-06 DIAGNOSIS — Z09 Encounter for follow-up examination after completed treatment for conditions other than malignant neoplasm: Secondary | ICD-10-CM

## 2019-01-06 LAB — POCT URINALYSIS DIPSTICK
Bilirubin, UA: NEGATIVE
Glucose, UA: NEGATIVE
Ketones, UA: NEGATIVE
Nitrite, UA: NEGATIVE
Protein, UA: NEGATIVE
Spec Grav, UA: 1.025 (ref 1.010–1.025)
Urobilinogen, UA: 0.2 E.U./dL
pH, UA: 5.5 (ref 5.0–8.0)

## 2019-01-06 MED ORDER — PREDNISONE 10 MG (21) PO TBPK: ORAL_TABLET | Freq: Every day | ORAL | 0 refills | Status: DC

## 2019-01-06 MED ORDER — ALLOPURINOL 100 MG PO TABS: 100.0000 mg | ORAL_TABLET | Freq: Every day | ORAL | 6 refills | Status: DC

## 2019-01-06 MED ORDER — COLCHICINE 0.6 MG PO TABS: 0.6000 mg | ORAL_TABLET | Freq: Three times a day (TID) | ORAL | 3 refills | Status: DC | PRN

## 2019-01-06 MED FILL — predniSONE 10 MG TABS: 10 | 12 days supply | Qty: 42 | Fill #0

## 2019-01-06 MED FILL — !COLCRYS 0.6 MG TABLET: 0.6 MG | 10 days supply | Qty: 30 | Fill #0

## 2019-01-06 MED FILL — ?ALLOPURINOL 100MG TABLET: 100 | 30 days supply | Qty: 30 | Fill #0

## 2019-01-06 NOTE — Progress Notes (Signed)
Patient Amanda Davenport   Established Patient Office Visit  Subjective:  Patient ID: Amanda Davenport, female    DOB: Nov 14, 1980  Age: 38 y.o. MRN: [id number]  CC:  Chief Complaint  Patient presents with   Hand Pain    left middle finger tender,    Exposure to STD    HPI Amanda Davenport is a 38 year old female who presents for Follow Up today.   Past Medical History:  Diagnosis Date   Allergy    Anemia    receives transfusions periodically   Arrhythmia    Chronic headache    Knee pain    Nearsightedness    wears glasses   Neuropathy    Obesity    Recurrent boils    WPW (Wolff-Parkinson-White syndrome)    Current Status: Since her last office visit, she has c/o gout in her left finger X 2-3 weeks now. She has not used any medication for relief. She r/t this flare up to what she previously ate the weekend before. She denies fevers, chills, fatigue, recent infections, weight loss, and night sweats. Sh has not had any headaches, visual changes, dizziness, and falls. No chest pain, heart palpitations, cough and shortness of breath reported. No reports of GI problems such as nausea, vomiting, diarrhea, and constipation. She has no reports of blood in stools, dysuria and hematuria. No depression or anxiety.  Past Surgical History:  Procedure Laterality Date   TONSILLECTOMY      Family History  Problem Relation Age of Onset   Breast cancer Mother    Pulmonary embolism Mother        died of PE   Colon cancer Mother    Irritable bowel syndrome Mother    Cancer Mother    Hypertension Father    Diabetes Paternal Grandmother    Heart disease Neg Hx    Stroke Neg Hx     Social History   Socioeconomic History   Marital status: Single    Spouse name: Not on file   Number of children: Not on file   Years of education: Not on file   Highest education level: Not on file  Occupational History   Not on file    Social Needs   Financial resource strain: Not on file   Food insecurity    Worry: Not on file    Inability: Not on file   Transportation needs    Medical: Not on file    Non-medical: Not on file  Tobacco Use   Smoking status: Current Some Day Smoker    Years: 0.50    Types: Cigarettes, Cigars   Smokeless tobacco: Never Used   Tobacco comment: smokes black and milds - last use early-mid August  Substance and Sexual Activity   Alcohol use: No   Drug use: Yes    Types: Marijuana    Comment: 2+ times per month   Sexual activity: Yes    Birth control/protection: None, Condom  Lifestyle   Physical activity    Days per week: Not on file    Minutes per session: Not on file   Stress: Not on file  Relationships   Social connections    Talks on phone: Not on file    Gets together: Not on file    Attends religious service: Not on file    Active member of club or organization: Not on file    Attends meetings of clubs or organizations: Not  on file    Relationship status: Not on file   Intimate partner violence    Fear of current or ex partner: Not on file    Emotionally abused: Not on file    Physically abused: Not on file    Forced sexual activity: Not on file  Other Topics Concern   Not on file  Social History Narrative   Not on file    Outpatient Medications Prior to Visit  Medication Sig Dispense Refill   Acetaminophen (TYLENOL ARTHRITIS PAIN PO) Take 600 mg by mouth.     acetaminophen (TYLENOL) 500 MG tablet Take 500 mg by mouth every 6 (six) hours as needed.     FLUoxetine (PROZAC) 20 MG capsule Take 1 capsule (20 mg total) by mouth daily. 30 capsule 3   gabapentin (NEURONTIN) 300 MG capsule Take 2 capsules (total = 600 mg), by mouth, 3 times daily. 180 capsule 3   Acetaminophen-Codeine (TYLENOL/CODEINE #3) 300-30 MG tablet Take 1 tablet by mouth every 4 (four) hours as needed for pain.     chlorhexidine (HIBICLENS) 4 % external liquid Apply  topically daily as needed. (Patient not taking: Reported on 12/28/2018) 120 mL 5   doxycycline (VIBRA-TABS) 100 MG tablet Take 1 tablet (100 mg total) by mouth 2 (two) times daily. (Patient not taking: Reported on 12/28/2018) 20 tablet 0   metroNIDAZOLE (FLAGYL) 500 MG tablet Take 4 tablets (2 grams) by mouth at once. (Patient not taking: Reported on 01/06/2019) 4 tablet 0   nitrofurantoin, macrocrystal-monohydrate, (MACROBID) 100 MG capsule Take 1 capsule (100 mg total) by mouth 2 (two) times daily. (Patient not taking: Reported on 01/06/2019) 14 capsule 0   omeprazole (PRILOSEC) 40 MG capsule Take 1 capsule (40 mg total) by mouth daily. (Patient not taking: Reported on 12/28/2018) 30 capsule 3   No facility-administered medications prior to visit.     Allergies  Allergen Reactions   Other Other (See Comments)    All Antibiotics cause severe vaginal yeast infections   Penicillins Hives, Itching and Swelling    Has patient had a PCN reaction causing immediate rash, facial/tongue/throat swelling, SOB or lightheadedness with hypotension: Yes Has patient had a PCN reaction causing severe rash involving mucus membranes or skin necrosis: Yes Has patient had a PCN reaction that required hospitalization Yes Has patient had a PCN reaction occurring within the last 10 years: Yes If all of the above answers are "NO", then may proceed with Cephalosporin use.    Shellfish-Derived Products Hives   Shrimp [Shellfish Allergy] Hives   Sulfa Antibiotics Rash    ROS Review of Systems  Constitutional: Negative.   HENT: Negative.   Eyes: Negative.   Respiratory: Negative.   Cardiovascular: Negative.   Gastrointestinal: Negative.   Endocrine: Negative.   Genitourinary: Negative.   Musculoskeletal: Positive for joint swelling (left finger, r/t gout attack).  Skin: Negative.   Allergic/Immunologic: Negative.   Neurological: Negative.   Hematological: Negative.   Psychiatric/Behavioral: Negative.        Objective:    Physical Exam  Constitutional: She is oriented to person, place, and time. She appears well-developed and well-nourished.  HENT:  Head: Normocephalic and atraumatic.  Eyes: Conjunctivae are normal.  Neck: Normal range of motion. Neck supple.  Cardiovascular: Normal rate, regular rhythm, normal heart sounds and intact distal pulses.  Pulmonary/Chest: Effort normal and breath sounds normal.  Abdominal: Soft. Bowel sounds are normal.  Musculoskeletal: Normal range of motion.  Neurological: She is alert and oriented to person,  place, and time. She has normal reflexes.  Skin: Skin is warm and dry.  Psychiatric: She has a normal mood and affect. Her behavior is normal. Judgment and thought content normal.  Nursing note reviewed.   BP 100/64 (BP Location: Right Arm, Patient Position: Sitting, Cuff Size: Large)    Pulse 100    Temp 98.6 F (37 C) (Oral)    Ht 5' 9"  (1.753 m)    Wt 270 lb 12.8 oz (122.8 kg)    LMP 01/01/2019    SpO2 100%    BMI 39.99 kg/m  Wt Readings from Last 3 Encounters:  01/06/19 270 lb 12.8 oz (122.8 kg)  12/27/18 274 lb (124.3 kg)  12/26/18 273 lb 3.2 oz (123.9 kg)     There are no preventive [facility] reminders to display for this patient.  There are no preventive [place] reminders to display for this patient.  Lab Results  Component Value Date   TSH 1.620 11/24/2017   Lab Results  Component Value Date   WBC 7.5 11/09/2018   HGB 10.9 (L) 11/09/2018   HCT 34.8 11/09/2018   MCV 85 11/09/2018   PLT 510 (H) 11/09/2018   Lab Results  Component Value Date   NA 140 08/01/2018   K 4.0 08/01/2018   CHLORIDE 109 12/18/2014   CO2 21 08/01/2018   GLUCOSE 87 08/01/2018   BUN 7 08/01/2018   CREATININE 0.77 08/01/2018   BILITOT 0.2 08/01/2018   ALKPHOS 75 08/01/2018   AST 12 08/01/2018   ALT 7 08/01/2018   PROT 7.8 08/01/2018   ALBUMIN 3.3 (L) 08/01/2018   CALCIUM 8.7 08/01/2018   ANIONGAP 8 05/10/2018   EGFR >90 12/18/2014   No results  found for: CHOL No results found for: HDL No results found for: LDLCALC No results found for: TRIG No results found for: [facility] Lab Results  Component Value Date   HGBA1C 5.3 03/31/2017      Assessment & Plan:   1. Gout, unspecified cause, unspecified chronicity, unspecified site She continues to have multiple joint attacks. We will initiate medications to aide in frequent gouty attacks. Lengthy discussion and written material also given to patient including a list of low-purine foods.  - allopurinol (ZYLOPRIM) 100 MG tablet; Take 1 tablet (100 mg total) by mouth daily.  Dispense: 30 tablet; Refill: 6 - colchicine 0.6 MG tablet; Take 1 tablet (0.6 mg total) by mouth every 8 (eight) hours as needed (for gouty attacks).  Dispense: 30 tablet; Refill: 3 - predniSONE (STERAPRED UNI-PAK 21 TAB) 10 MG (21) TBPK tablet; Take by mouth daily. Take 6 tabs by mouth daily for 2 days, then 5 tabs for 2 days, then 4 tabs for 2 days, then 3 tabs for 2 days, 2 tabs for 2 days, then 1 tab by mouth daily for 2 days  Dispense: 42 tablet; Refill: 0 - Uric Acid  2. Possible exposure to STD - HSV(herpes smplx)abs-1+2(IgG+IgM)-bld - Trichomonas vaginalis, RNA  3. Abnormal urine - POCT urinalysis dipstick  4. Follow up She will keep previously scheduled follow up appointment.   Meds ordered this encounter  Medications   allopurinol (ZYLOPRIM) 100 MG tablet    Sig: Take 1 tablet (100 mg total) by mouth daily.    Dispense:  30 tablet    Refill:  6   colchicine 0.6 MG tablet    Sig: Take 1 tablet (0.6 mg total) by mouth every 8 (eight) hours as needed (for gouty attacks).    Dispense:  30 tablet    Refill:  3   predniSONE (STERAPRED UNI-PAK 21 TAB) 10 MG (21) TBPK tablet    Sig: Take by mouth daily. Take 6 tabs by mouth daily for 2 days, then 5 tabs for 2 days, then 4 tabs for 2 days, then 3 tabs for 2 days, 2 tabs for 2 days, then 1 tab by mouth daily for 2 days    Dispense:  42 tablet    Refill:   0    Orders Placed This Encounter  Procedures   Trichomonas vaginalis, RNA   Uric Acid   HSV(herpes smplx)abs-1+2(IgG+IgM)-bld   POCT urinalysis dipstick   Referral Orders  No referral(s) requested today    Kathe Becton,  MSN, FNP-BC [facility] [place], [place] [address] [phone] fax  Problem List Items Addressed This Visit    None    Visit Diagnoses    Gout, unspecified cause, unspecified chronicity, unspecified site    -  Primary   Relevant Medications   allopurinol (ZYLOPRIM) 100 MG tablet   colchicine 0.6 MG tablet   predniSONE (STERAPRED UNI-PAK 21 TAB) 10 MG (21) TBPK tablet   Other Relevant Orders   Uric Acid (Completed)   Possible exposure to STD       Relevant Orders   HSV(herpes smplx)abs-1+2(IgG+IgM)-bld (Completed)   Trichomonas vaginalis, RNA   Abnormal urine       Relevant Orders   POCT urinalysis dipstick (Completed)   Follow up          Meds ordered this encounter  Medications   allopurinol (ZYLOPRIM) 100 MG tablet    Sig: Take 1 tablet (100 mg total) by mouth daily.    Dispense:  30 tablet    Refill:  6   colchicine 0.6 MG tablet    Sig: Take 1 tablet (0.6 mg total) by mouth every 8 (eight) hours as needed (for gouty attacks).    Dispense:  30 tablet    Refill:  3   predniSONE (STERAPRED UNI-PAK 21 TAB) 10 MG (21) TBPK tablet    Sig: Take by mouth daily. Take 6 tabs by mouth daily for 2 days, then 5 tabs for 2 days, then 4 tabs for 2 days, then 3 tabs for 2 days, 2 tabs for 2 days, then 1 tab by mouth daily for 2 days    Dispense:  42 tablet    Refill:  0    Follow-up: No follow-ups on file.    Azzie Glatter, FNP

## 2019-01-06 NOTE — Patient Instructions (Signed)
Low-Purine Eating Plan A low-purine eating plan involves making food choices to limit your intake of purine. Purine is a kind of uric acid. Too much uric acid in your blood can cause certain conditions, such as gout and kidney stones. Eating a low-purine diet can help control these conditions. What are tips for following this plan? Reading food labels   Avoid foods with saturated or Trans fat.  Check the ingredient list of grains-based foods, such as bread and cereal, to make sure that they contain whole grains.  Check the ingredient list of sauces or soups to make sure they do not contain meat or fish.  When choosing soft drinks, check the ingredient list to make sure they do not contain high-fructose corn syrup. Shopping  Buy plenty of fresh fruits and vegetables.  Avoid buying canned or fresh fish.  Buy dairy products labeled as low-fat or nonfat.  Avoid buying premade or processed foods. These foods are often high in fat, salt (sodium), and added sugar. Cooking  Use olive oil instead of butter when cooking. Oils like olive oil, canola oil, and sunflower oil contain healthy fats. Meal planning  Learn which foods do or do not affect you. If you find out that a food tends to cause your gout symptoms to flare up, avoid eating that food. You can enjoy foods that do not cause problems. If you have any questions about a food item, talk with your dietitian or health care provider.  Limit foods high in fat, especially saturated fat. Fat makes it harder for your body to get rid of uric acid.  Choose foods that are lower in fat and are lean sources of protein. General guidelines  Limit alcohol intake to no more than 1 drink a day for nonpregnant women and 2 drinks a day for men. One drink equals 12 oz of beer, 5 oz of wine, or 1 oz of hard liquor. Alcohol can affect the way your body gets rid of uric acid.  Drink plenty of water to keep your urine clear or pale yellow. Fluids can help  remove uric acid from your body.  If directed by your health care provider, take a vitamin C supplement.  Work with your health care provider and dietitian to develop a plan to achieve or maintain a healthy weight. Losing weight can help reduce uric acid in your blood. What foods are recommended? The items listed may not be a complete list. Talk with your dietitian about what dietary choices are best for you. Foods low in purines Foods low in purines do not need to be limited. These include:  All fruits.  All low-purine vegetables, pickles, and olives.  Breads, pasta, rice, cornbread, and popcorn. Cake and other baked goods.  All dairy foods.  Eggs, nuts, and nut butters.  Spices and condiments, such as salt, herbs, and vinegar.  Plant oils, butter, and margarine.  Water, sugar-free soft drinks, tea, coffee, and cocoa.  Vegetable-based soups, broths, sauces, and gravies. Foods moderate in purines Foods moderate in purines should be limited to the amounts listed.   cup of asparagus, cauliflower, spinach, mushrooms, or green peas, each day.  2/3 cup uncooked oatmeal, each day.   cup dry wheat bran or wheat germ, each day.  2-3 ounces of meat or poultry, each day.  4-6 ounces of shellfish, such as crab, lobster, oysters, or shrimp, each day.  1 cup cooked beans, peas, or lentils, each day.  Soup, broths, or bouillon made from meat or   fish. Limit these foods as much as possible. What foods are not recommended? The items listed may not be a complete list. Talk with your dietitian about what dietary choices are best for you. Limit your intake of foods high in purines, including:  Beer and other alcohol.  Meat-based gravy or sauce.  Canned or fresh fish, such as: ? Anchovies, sardines, herring, and tuna. ? Mussels and scallops. ? Codfish, trout, and haddock.  Berniece Salines.  Organ meats, such as: ? Liver or kidney. ? Tripe. ? Sweetbreads (thymus gland or pancreas).   [employer] biochemist.  Yeast or yeast extract supplements.  Drinks sweetened with high-fructose corn syrup. Summary  Eating a low-purine diet can help control conditions caused by too much uric acid in the body, such as gout or kidney stones.  Choose low-purine foods, limit alcohol, and limit foods high in fat.  You will learn over time which foods do or do not affect you. If you find out that a food tends to cause your gout symptoms to flare up, avoid eating that food. This information is not intended to replace advice given to you by your health care provider. Make sure you discuss any questions you have with your health care provider. Document Released: 07/18/2010 Document Revised: 03/05/2017 Document Reviewed: 05/06/2016 Elsevier Patient Education  2020 [employer]. Uric Acid Nephropathy  Uric acid is a chemical compound that is made when your body digests some kinds of food and also when your body breaks down dead cells. It is a waste product that is normally removed from your body by your kidneys. If you have too much uric acid in your blood, it can build up in your kidneys and cause damage (nephropathy). There are two types of uric acid nephropathy:  Sudden (acute) uric acid nephropathy results from a sudden buildup of uric acid.  Long-term (chronic) uric acid nephropathy results from a slow buildup of uric acid over a long period of time. What are the causes? The exact cause of this condition may depend on the type of uric acid nephropathy:  Acute uric acid nephropathy may be caused by: ? Receiving medicines for the treatment of cancer (chemotherapy). Use of these medicines causes rapid breakdown of cells. The cell breakdown produces excess uric acid. As uric acid builds up in your kidneys, it causes an increase of pressure and a loss of blood supply. This makes your kidneys less able to filter blood and make urine. ? A tumor (cancer) in the body. ? Seizures. ? Taking medicines  that can cause excess uric acid. Examples are aspirin, water pills (diuretics), and medicines that are prescribed after an organ transplant. ? Severe diarrhea, which causes fluid loss (dehydration).  Chronic uric acid nephropathy may happen if you have high levels of uric acid in your body on a regular basis. One reason you may have high levels of uric acid is gout. With gout, excess uric acid forms into crystals. These crystals can get stuck inside joints and cause painful swelling. They may also build up in your kidneys and cause long-term damage. What increases the risk? You may be more likely to develop this condition if you:  Are female.  Are 65 years old or older.  Have gout.  Eat a lot of foods that are high in certain natural chemical compounds (purines). Shellfish and red meat contain a lot of purines.  Drink alcohol.  Have recently had heart surgery. What are the signs or symptoms? Signs and symptoms depend  on the type of nephropathy that you have. They may include:  Decreased urine output.  Nausea and vomiting.  Lack of energy.  Seizures.  Blood-tinged urine.  Pain when passing urine.  Pain in the sides of the lower back (flank pain). In some cases, there are no symptoms. How is this diagnosed? Your health care provider may suspect uric acid nephropathy from your signs and symptoms, especially if you have gout. He or she may:  Do a physical exam.  Order tests to confirm the diagnosis. Tests may include: ? Blood and urine tests. This is the best way to measure high levels of uric acid. ? Imaging studies to check for kidney stones or kidney damage. These may include:  X-rays.  Ultrasound.  CT scan.  MRI. How is this treated? The goal of treatment is to lower the level of uric acid in your body and prevent kidney damage. This can be done by:  Taking medicines that block the production of uric acid. The most commonly used medicine is allopurinol. If you are  starting chemotherapy, ask your health care provider if you should start taking a medicine to prevent high uric acid.  Starting a diet plan that lowers your intake of purines. Work with a diet and nutrition specialist (dietitian) to limit your intake of foods and drinks that increase uric acid.  Preventing uric acid buildup. Drink plenty of water to maintain a good flow of urine and to lower the acidity of your urine. You may also need to take a medicine called bicarbonate.  Resting the kidneys. This can be done by using a machine to clean your blood (hemodialysis), if necessary. In hemodialysis, your blood is removed, passed through a filtering machine, and then returned to your body. Several sessions of hemodialysis usually improve kidney function by removing uric acid. Follow these instructions at home: Eating and drinking      Drink enough fluid to keep your urine pale yellow.  Do not drink alcohol.  Do not drink beverages that contain a type of sugar called fructose.  Limit how much red meat and shellfish you eat.  Include plenty of low-fat dairy foods in your diet. General instructions  Take over-the-counter and prescription medicines only as told by your health care provider.  Maintain a healthy weight. Lose weight as directed by your health care provider.  Keep all follow-up visits as told by your health care provider. This is important. Contact a health care provider if you:  Feel tired and have low energy, even when you get enough sleep.  Have pain when passing urine.  Have nausea or vomiting. Get help right away if you:  Produce very little urine, even when you drink enough fluids.  Have blood in your urine.  Have a seizure. Summary  Uric acid is a waste product that is normally removed from your body by your kidneys. If you have too much uric acid in your blood, it can build up in your kidneys and cause damage (nephropathy).  Sudden (acute) uric acid  nephropathy results from a sudden buildup of uric acid. Long-term (chronic) uric acid nephropathy results from a slow buildup of uric acid over a long period of time. This may happen if you have gout.  The goal of treatment is to lower the level of uric acid in your body and prevent kidney damage. This information is not intended to replace advice given to you by your health care provider. Make sure you discuss any questions you  have with your health care provider. Document Released: 01/18/2007 Document Revised: 07/12/2018 Document Reviewed: 04/13/2017 Elsevier Patient Education  [place]. Colchicine tablets or capsules What is this medicine? COLCHICINE (KOL chi seen) is used to prevent or treat attacks of acute gout or gouty arthritis. This medicine is also used to treat familial Mediterranean fever. This medicine may be used for other purposes; ask your health care provider or pharmacist if you have questions. COMMON BRAND NAME(S): Colcrys, MITIGARE What should I tell my health care provider before I take this medicine? They need to know if you have any of these conditions:  kidney disease  liver disease  an unusual or allergic reaction to colchicine, other medicines, foods, dyes, or preservatives  pregnant or trying to get pregnant  breast-feeding How should I use this medicine? Take this medicine by mouth with a full glass of water. Follow the directions on the prescription label. You can take it with or without food. If it upsets your stomach, take it with food. Take your medicine at regular intervals. Do not take your medicine more often than directed. A special MedGuide will be given to you by the pharmacist with each prescription and refill. Be sure to read this information carefully each time. Talk to your pediatrician regarding the use of this medicine in children. While this drug may be prescribed for children as young as 61 years old for selected conditions, precautions  do apply. Patients over 39 years old may have a stronger reaction and need a smaller dose. Overdosage: If you think you have taken too much of this medicine contact a poison control center or emergency room at once. NOTE: This medicine is only for you. Do not share this medicine with others. What if I miss a dose? If you miss a dose, take it as soon as you can. If it is almost time for your next dose, take only that dose. Do not take double or extra doses. What may interact with this medicine? Do not take this medicine with any of the following medications:  certain antivirals for HIV or hepatitis This medicine may also interact with the following medications:  certain antibiotics like erythromycin or clarithromycin  certain medicines for blood pressure, heart disease, irregular heart beat  certain medicines for cholesterol like atorvastatin, lovastatin, and simvastatin  certain medicines for fungal infections like ketoconazole, itraconazole, or posaconazole  cyclosporine  grapefruit or grapefruit juice This list may not describe all possible interactions. Give your health care provider a list of all the medicines, herbs, non-prescription drugs, or dietary supplements you use. Also tell them if you smoke, drink alcohol, or use illegal drugs. Some items may interact with your medicine. What should I watch for while using this medicine? Visit your healthcare professional for regular checks on your progress. Tell your healthcare professional if your symptoms do not start to get better or if they get worse. You should make sure you get enough vitamin B12 while you are taking this medicine. Discuss the foods you eat and the vitamins you take with your healthcare professional. This medicine may increase your risk to bruise or bleed. Call you healthcare professional if you notice any unusual bleeding. What side effects may I notice from receiving this medicine? Side effects that you should  report to your doctor or health care professional as soon as possible:  allergic reactions like skin rash, itching or hives, swelling of the face, lips, or tongue  low blood counts - this medicine may  decrease the number of white blood cells, red blood cells, and platelets. You may be at increased risk for infections and bleeding  pain, tingling, numbness in the hands or feet  severe diarrhea  signs and symptoms of infection like fever; chills; cough; sore throat; pain or trouble passing urine  signs and symptoms of muscle injury like dark urine; trouble passing urine or change in the amount of urine; unusually weak or tired; muscle pain; back pain  unusual bleeding or bruising  unusually weak or tired  vomiting Side effects that usually do not require medical attention (report these to your doctor or health care professional if they continue or are bothersome):  mild diarrhea  nausea  stomach pain This list may not describe all possible side effects. Call your doctor for medical advice about side effects. You may report side effects to FDA at 1-800-FDA-1088. Where should I keep my medicine? Keep out of the reach of children. Store at room temperature between 15 and 30 degrees C (59 and 86 degrees F). Keep container tightly closed. Protect from light. Throw away any unused medicine after the expiration date. NOTE: This sheet is a summary. It may not cover all possible information. If you have questions about this medicine, talk to your doctor, pharmacist, or health care provider.  2020 Elsevier/Gold Standard (2017-06-09 18:45:11) Allopurinol tablets What is this medicine? ALLOPURINOL (al oh PURE i nole) reduces the amount of uric acid the body makes. It is used to treat the symptoms of gout. It is also used to treat or prevent high uric acid levels that occur as a result of certain types of chemotherapy. This medicine may also help patients who frequently have kidney stones. This  medicine may be used for other purposes; ask your health care provider or pharmacist if you have questions. COMMON BRAND NAME(S): Zyloprim What should I tell my health care provider before I take this medicine? They need to know if you have any of these conditions:  kidney disease  liver disease  an unusual or allergic reaction to allopurinol, other medicines, foods, dyes, or preservatives  pregnant or trying to get pregnant  breast feeding How should I use this medicine? Take this medicine by mouth with a glass of water. Follow the directions on the prescription label. If this medicine upsets your stomach, take it with food or milk. Take your doses at regular intervals. Do not take your medicine more often than directed. Talk to your pediatrician regarding the use of this medicine in children. Special care may be needed. While this drug may be prescribed for children as young as 6 years for selected conditions, precautions do apply. Overdosage: If you think you have taken too much of this medicine contact a poison control center or emergency room at once. NOTE: This medicine is only for you. Do not share this medicine with others. What if I miss a dose? If you miss a dose, take it as soon as you can. If it is almost time for your next dose, take only that dose. Do not take double or extra doses. What may interact with this medicine? Do not take this medicine with the following medication:  didanosine, ddI This medicine may also interact with the following medications:  certain antibiotics like amoxicillin, ampicillin  certain medicines for cancer  certain medicines for immunosuppression like azathioprine, cyclosporine, mercaptopurine  chlorpropamide  probenecid  thiazide diuretics, like hydrochlorothiazide  sulfinpyrazone  warfarin This list may not describe all possible interactions. Give your  health care provider a list of all the medicines, herbs, non-prescription drugs,  or dietary supplements you use. Also tell them if you smoke, drink alcohol, or use illegal drugs. Some items may interact with your medicine. What should I watch for while using this medicine? Visit your doctor or healthcare provider for regular checks on your progress. If you are taking this medicine to treat gout, you may not have less frequent attacks at first. Keep taking your medicine regularly and the attacks should get better within 2 to 6 weeks. Drink plenty of water (10 to 12 full glasses a day) while you are taking this medicine. This will help to reduce stomach upset and reduce the risk of getting gout or kidney stones. Call your doctor or healthcare provider at once if you get a skin rash together with chills, fever, sore throat, or nausea and vomiting, if you have blood in your urine, or difficulty passing urine. This medicine may cause serious skin reactions. They can happen weeks to months after starting the medicine. Contact your healthcare provider right away if you notice fevers or flu-like symptoms with a rash. The rash may be red or purple and then turn into blisters or peeling of the skin. Or, you might notice a red rash with swelling of the face, lips or lymph nodes in your neck or under your arms. Do not take vitamin C without asking your doctor or healthcare provider. Too much vitamin C can increase the chance of getting kidney stones. You may get drowsy or dizzy. Do not drive, use machinery, or do anything that needs mental alertness until you know how this drug affects you. Do not stand or sit up quickly, especially if you are an older patient. This reduces the risk of dizzy or fainting spells. Alcohol can make you more drowsy and dizzy. Alcohol can also increase the chance of stomach problems and increase the amount of uric acid in your blood. Avoid alcoholic drinks. What side effects may I notice from receiving this medicine? Side effects that you should report to your doctor or  health care professional as soon as possible:  allergic reactions like skin rash, itching or hives, swelling of the face, lips, or tongue  breathing problems  joint pain  muscle pain  rash, fever, and swollen lymph nodes  redness, blistering, peeling, or loosening of the skin, including inside the mouth  signs and symptoms of infection like fever or chills; cough; sore throat  signs and symptoms of kidney injury like trouble passing urine or change in the amount of urine, flank pain  tingling, numbness in the hands or feet  unusual bleeding or bruising  unusually weak or tired Side effects that usually do not require medical attention (report to your doctor or health care professional if they continue or are bothersome):  changes in taste  diarrhea  drowsiness  headache  nausea, vomiting  stomach upset This list may not describe all possible side effects. Call your doctor for medical advice about side effects. You may report side effects to FDA at 1-800-FDA-1088. Where should I keep my medicine? Keep out of the reach of children. Store at room temperature between 15 and 25 degrees C (59 and 77 degrees F). Protect from light and moisture. Throw away any unused medicine after the expiration date. NOTE: This sheet is a summary. It may not cover all possible information. If you have questions about this medicine, talk to your doctor, pharmacist, or health care provider.  2020  Elsevier/Gold Standard (2018-06-14 09:41:46) Gout  Gout is painful swelling of your joints. Gout is a type of arthritis. It is caused by having too much uric acid in your body. Uric acid is a chemical that is made when your body breaks down substances called purines. If your body has too much uric acid, sharp crystals can form and build up in your joints. This causes pain and swelling. Gout attacks can happen quickly and be very painful (acute gout). Over time, the attacks can affect more joints and  happen more often (chronic gout). What are the causes?  Too much uric acid in your blood. This can happen because: ? Your kidneys do not remove enough uric acid from your blood. ? Your body makes too much uric acid. ? You eat too many foods that are high in purines. These foods include organ meats, some seafood, and beer.  Trauma or stress. What increases the risk?  Having a family history of gout.  Being female and middle-aged.  Being female and having gone through menopause.  Being very overweight (obese).  Drinking alcohol, especially beer.  Not having enough water in the body (being dehydrated).  Losing weight too quickly.  Having an organ transplant.  Having lead poisoning.  Taking certain medicines.  Having kidney disease.  Having a skin condition called psoriasis. What are the signs or symptoms? An attack of acute gout usually happens in just one joint. The most common place is the big toe. Attacks often start at night. Other joints that may be affected include joints of the feet, ankle, knee, fingers, wrist, or elbow. Symptoms of an attack may include:  Very bad pain.  Warmth.  Swelling.  Stiffness.  Shiny, red, or purple skin.  Tenderness. The affected joint may be very painful to touch.  Chills and fever. Chronic gout may cause symptoms more often. More joints may be involved. You may also have white or yellow lumps (tophi) on your hands or feet or in other areas near your joints. How is this treated?  Treatment for this condition has two phases: treating an acute attack and preventing future attacks.  Acute gout treatment may include: ? NSAIDs. ? Steroids. These are taken by mouth or injected into a joint. ? Colchicine. This medicine relieves pain and swelling. It can be given by mouth or through an IV tube.  Preventive treatment may include: ? Taking small doses of NSAIDs or colchicine daily. ? Using a medicine that reduces uric acid levels in  your blood. ? Making changes to your diet. You may need to see a food expert (dietitian) about what to eat and drink to prevent gout. Follow these instructions at home: During a gout attack   If told, put ice on the painful area: ? Put ice in a plastic bag. ? Place a towel between your skin and the bag. ? Leave the ice on for 20 minutes, 2-3 times a day.  Raise (elevate) the painful joint above the level of your heart as often as you can.  Rest the joint as much as possible. If the joint is in your leg, you may be given crutches.  Follow instructions from your doctor about what you cannot eat or drink. Avoiding future gout attacks  Eat a low-purine diet. Avoid foods and drinks such as: ? Liver. ? Kidney. ? Anchovies. ? Asparagus. ? Herring. ? Mushrooms. ? Mussels. ? Beer.  Stay at a healthy weight. If you want to lose weight, talk with your  doctor. Do not lose weight too fast.  Start or continue an exercise plan as told by your doctor. Eating and drinking  Drink enough fluids to keep your pee (urine) pale yellow.  If you drink alcohol: ? Limit how much you use to:  0-1 drink a day for women.  0-2 drinks a day for men. ? Be aware of how much alcohol is in your drink. In the [place] one drink equals one 12 oz bottle of beer (355 mL), one 5 oz glass of wine (148 mL), or one 1 oz glass of hard liquor (44 mL). General instructions  Take over-the-counter and prescription medicines only as told by your doctor.  Do not drive or use heavy machinery while taking prescription pain medicine.  Return to your normal activities as told by your doctor. Ask your doctor what activities are safe for you.  Keep all follow-up visits as told by your doctor. This is important. Contact a doctor if:  You have another gout attack.  You still have symptoms of a gout attack after 10 days of treatment.  You have problems (side effects) because of your medicines.  You have chills or a  fever.  You have burning pain when you pee (urinate).  You have pain in your lower back or belly. Get help right away if:  You have very bad pain.  Your pain cannot be controlled.  You cannot pee. Summary  Gout is painful swelling of the joints.  The most common site of pain is the big toe, but it can affect other joints.  Medicines and avoiding some foods can help to prevent and treat gout attacks. This information is not intended to replace advice given to you by your health care provider. Make sure you discuss any questions you have with your health care provider. Document Released: 12/31/2007 Document Revised: 10/13/2017 Document Reviewed: 10/13/2017 Elsevier Patient Education  2020 [employer].

## 2019-01-07 ENCOUNTER — Encounter: Payer: Self-pay | Admitting: Family Medicine

## 2019-01-10 LAB — HSV(HERPES SMPLX)ABS-I+II(IGG+IGM)-BLD
HSV 1 Glycoprotein G Ab, IgG: <0.91 index (ref 0.00–0.90)
HSV 2 IgG, Type Spec: <0.91 index (ref 0.00–0.90)
HSVI/II Comb IgM: <0.91 Ratio (ref 0.00–0.90)

## 2019-01-10 LAB — TRICHOMONAS VAGINALIS, PROBE AMP: Trich vag by NAA: NEGATIVE

## 2019-01-10 LAB — URIC ACID: Uric Acid: 6.9 mg/dL (ref 2.5–7.1)

## 2019-01-30 MED FILL — ?ALLOPURINOL 100MG TABLET: 100 | 30 days supply | Qty: 30 | Fill #1

## 2019-01-30 MED FILL — !COLCRYS 0.6 MG TABLET: 0.6 MG | 10 days supply | Qty: 30 | Fill #1

## 2019-01-30 MED FILL — GABAPENTIN 300 MG CAPSULE: 300 | 30 days supply | Qty: 90 | Fill #1

## 2019-02-09 ENCOUNTER — Ambulatory Visit: Payer: Self-pay | Admitting: Family Medicine

## 2019-02-13 MED FILL — CLINDAMYCIN HCL 300 MG CAPS: 300 | 10 days supply | Qty: 30 | Fill #0

## 2019-02-13 MED FILL — GABAPENTIN 300 MG CAPSULE: 300 | 30 days supply | Qty: 180 | Fill #0

## 2019-02-14 ENCOUNTER — Other Ambulatory Visit: Payer: Self-pay

## 2019-02-14 ENCOUNTER — Encounter: Payer: Self-pay | Admitting: Family Medicine

## 2019-02-14 ENCOUNTER — Ambulatory Visit (INDEPENDENT_AMBULATORY_CARE_PROVIDER_SITE_OTHER): Payer: Self-pay | Admitting: Family Medicine

## 2019-02-14 VITALS — BP 110/62 | HR 106 | Temp 99.3°F | Ht 69.0 in | Wt 271.0 lb

## 2019-02-14 DIAGNOSIS — M79602 Pain in left arm: Secondary | ICD-10-CM

## 2019-02-14 DIAGNOSIS — L732 Hidradenitis suppurativa: Secondary | ICD-10-CM

## 2019-02-14 MED ORDER — ACETAMINOPHEN-CODEINE 300-30 MG PO TABS: 1.0000 | ORAL_TABLET | ORAL | 0 refills | Status: DC | PRN

## 2019-02-14 MED ORDER — IBUPROFEN 600 MG PO TABS: 600.0000 mg | ORAL_TABLET | Freq: Three times a day (TID) | ORAL | 0 refills | Status: DC | PRN

## 2019-02-14 MED ORDER — CHLORHEXIDINE GLUCONATE 4 % EX LIQD: Freq: Every day | CUTANEOUS | 0 refills | Status: DC | PRN

## 2019-02-14 MED ORDER — DOXYCYCLINE HYCLATE 100 MG PO TABS: 100.0000 mg | ORAL_TABLET | Freq: Two times a day (BID) | ORAL | 0 refills | Status: DC

## 2019-02-14 MED FILL — IBUPROFEN 600 MG TABLET: 600 | 10 days supply | Qty: 30 | Fill #0

## 2019-02-14 MED FILL — !COLCRYS 0.6 MG TABLET: 0.6 MG | 10 days supply | Qty: 30 | Fill #1

## 2019-02-14 MED FILL — ?ALLOPURINOL 100MG TABLET: 100 | 30 days supply | Qty: 30 | Fill #1

## 2019-02-14 MED FILL — DOXYCYCLINE HYCLATE 100 MG: 100 | 10 days supply | Qty: 20 | Fill #0

## 2019-02-14 NOTE — Progress Notes (Signed)
Established Patient Office Visit  Subjective:  Patient ID: Amanda Davenport, female    DOB: [birth year]  Age: 38 y.o. MRN: [id number]  CC:  Chief Complaint  Patient presents with  . Follow-up    hidradenitis follow up     HPI Amanda Davenport, a 38 year old female with a medical history significant for Wolff-Parkinson-White syndrome, genital herpes, peripheral neuropathy, and iron deficiency anemia presents with complaints of worsening hidradenitis suppurativa.  Patient has a long history of hidradenitis suppurativa with frequent flares.  Patient is complaining of increased boils to left axilla, groin, and inner thigh.  Patient states that boils have been worsening over the past several weeks.  She characterizes boils as draining, purulent odor, and increased pain.  Her current pain intensity is 8/10.  She states, "I can barely hold my arms up due to the pain".  She denies fever, chills, weight loss, urinary symptoms, nausea, vomiting, or diarrhea.  Past Medical History:  Diagnosis Date  . Allergy   . Anemia    receives transfusions periodically  . Arrhythmia   . Chronic headache   . Gout 12/2018  . Knee pain   . Nearsightedness    wears glasses  . Neuropathy   . Obesity   . Recurrent boils   . WPW (Wolff-Parkinson-White syndrome)     Past Surgical History:  Procedure Laterality Date  . TONSILLECTOMY      Family History  Problem Relation Age of Onset  . Breast cancer Mother   . Pulmonary embolism Mother        died of PE  . Colon cancer Mother   . Irritable bowel syndrome Mother   . Cancer Mother   . Hypertension Father   . Diabetes Paternal Grandmother   . Heart disease Neg Hx   . Stroke Neg Hx     Social History   Socioeconomic History  . Marital status: Single    Spouse name: Not on file  . Number of children: Not on file  . Years of education: Not on file  . Highest education level: Not on file  Occupational History  . Not on file  Social Needs  .  Financial resource strain: Not on file  . Food insecurity    Worry: Not on file    Inability: Not on file  . Transportation needs    Medical: Not on file    Non-medical: Not on file  Tobacco Use  . Smoking status: Current Some Day Smoker    Years: 0.50    Types: Cigarettes, Cigars  . Smokeless tobacco: Never Used  . Tobacco comment: smokes black and milds - last use early-mid August  Substance and Sexual Activity  . Alcohol use: No  . Drug use: Yes    Types: Marijuana    Comment: 2+ times per month  . Sexual activity: Yes    Birth control/protection: None, Condom  Lifestyle  . Physical activity    Days per week: Not on file    Minutes per session: Not on file  . Stress: Not on file  Relationships  . Social Herbalist on phone: Not on file    Gets together: Not on file    Attends religious service: Not on file    Active member of club or organization: Not on file    Attends meetings of clubs or organizations: Not on file    Relationship status: Not on file  . Intimate partner violence  Fear of current or ex partner: Not on file    Emotionally abused: Not on file    Physically abused: Not on file    Forced sexual activity: Not on file  Other Topics Concern  . Not on file  Social History Narrative  . Not on file    Outpatient Medications Prior to Visit  Medication Sig Dispense Refill  . Acetaminophen (TYLENOL ARTHRITIS PAIN PO) Take 600 mg by mouth.    Marland Kitchen acetaminophen (TYLENOL) 500 MG tablet Take 500 mg by mouth every 6 (six) hours as needed.    Marland Kitchen allopurinol (ZYLOPRIM) 100 MG tablet Take 1 tablet (100 mg total) by mouth daily. 30 tablet 6  . colchicine 0.6 MG tablet Take 1 tablet (0.6 mg total) by mouth every 8 (eight) hours as needed (for gouty attacks). 30 tablet 3  . FLUoxetine (PROZAC) 20 MG capsule Take 1 capsule (20 mg total) by mouth daily. 30 capsule 3  . gabapentin (NEURONTIN) 300 MG capsule Take 2 capsules (total = 600 mg), by mouth, 3 times  daily. 180 capsule 3  . Acetaminophen-Codeine (TYLENOL/CODEINE #3) 300-30 MG tablet Take 1 tablet by mouth every 4 (four) hours as needed for pain.    . chlorhexidine (HIBICLENS) 4 % external liquid Apply topically daily as needed. (Patient not taking: Reported on 12/28/2018) 120 mL 5  . doxycycline (VIBRA-TABS) 100 MG tablet Take 1 tablet (100 mg total) by mouth 2 (two) times daily. (Patient not taking: Reported on 12/28/2018) 20 tablet 0  . metroNIDAZOLE (FLAGYL) 500 MG tablet Take 4 tablets (2 grams) by mouth at once. (Patient not taking: Reported on 01/06/2019) 4 tablet 0  . nitrofurantoin, macrocrystal-monohydrate, (MACROBID) 100 MG capsule Take 1 capsule (100 mg total) by mouth 2 (two) times daily. (Patient not taking: Reported on 01/06/2019) 14 capsule 0  . omeprazole (PRILOSEC) 40 MG capsule Take 1 capsule (40 mg total) by mouth daily. (Patient not taking: Reported on 12/28/2018) 30 capsule 3  . predniSONE (STERAPRED UNI-PAK 21 TAB) 10 MG (21) TBPK tablet Take by mouth daily. Take 6 tabs by mouth daily for 2 days, then 5 tabs for 2 days, then 4 tabs for 2 days, then 3 tabs for 2 days, 2 tabs for 2 days, then 1 tab by mouth daily for 2 days 42 tablet 0   No facility-administered medications prior to visit.     Allergies  Allergen Reactions  . Other Other (See Comments)    All Antibiotics cause severe vaginal yeast infections  . Penicillins Hives, Itching and Swelling    Has patient had a PCN reaction causing immediate rash, facial/tongue/throat swelling, SOB or lightheadedness with hypotension: Yes Has patient had a PCN reaction causing severe rash involving mucus membranes or skin necrosis: Yes Has patient had a PCN reaction that required hospitalization Yes Has patient had a PCN reaction occurring within the last 10 years: Yes If all of the above answers are "NO", then may proceed with Cephalosporin use.   . Shellfish-Derived Products Hives  . Shrimp [Shellfish Allergy] Hives  . Sulfa  Antibiotics Rash    ROS Review of Systems  Constitutional: Negative for chills, fatigue and fever.  HENT: Negative.   Respiratory: Negative.  Negative for shortness of breath.   Cardiovascular: Negative.  Negative for leg swelling.  Gastrointestinal: Negative.   Endocrine: Negative for polydipsia, polyphagia and polyuria.  Genitourinary: Negative.   Musculoskeletal: Negative.   Skin: Positive for wound (Left axilla, groin).  Allergic/Immunologic: Negative.   Neurological: Negative.  Hematological: Negative.   Psychiatric/Behavioral: Negative.       Objective:    Physical Exam  Constitutional: She is oriented to person, place, and time.  HENT:  Head: Normocephalic.  Eyes: Pupils are equal, round, and reactive to light.  Neck: Normal range of motion. Neck supple.  Cardiovascular: Normal rate, regular rhythm, normal heart sounds and intact distal pulses.  Abdominal: Soft. Bowel sounds are normal.  Neurological: She is alert and oriented to person, place, and time.  Skin: Skin is warm and dry. There is erythema.     Wide spread tracts, lumps of varying sizes, tender to touch, purulent drainage, putrid odor  Psychiatric: She has a normal mood and affect. Her behavior is normal. Judgment and thought content normal.    BP 110/62 (BP Location: Right Arm, Patient Position: Sitting, Cuff Size: Normal)   Pulse (!) 106   Temp 99.3 F (37.4 C) (Oral)   Ht 5' 9"  (1.753 m)   Wt 271 lb (122.9 kg)   LMP 01/22/2019   BMI 40.02 kg/m  Wt Readings from Last 3 Encounters:  02/14/19 271 lb (122.9 kg)  01/06/19 270 lb 12.8 oz (122.8 kg)  12/27/18 274 lb (124.3 kg)     Lab Results  Component Value Date   TSH 1.620 11/24/2017   Lab Results  Component Value Date   WBC 7.5 11/09/2018   HGB 10.9 (L) 11/09/2018   HCT 34.8 11/09/2018   MCV 85 11/09/2018   PLT 510 (H) 11/09/2018   Lab Results  Component Value Date   NA 140 08/01/2018   K 4.0 08/01/2018   CHLORIDE 109  12/18/2014   CO2 21 08/01/2018   GLUCOSE 87 08/01/2018   BUN 7 08/01/2018   CREATININE 0.77 08/01/2018   BILITOT 0.2 08/01/2018   ALKPHOS 75 08/01/2018   AST 12 08/01/2018   ALT 7 08/01/2018   PROT 7.8 08/01/2018   ALBUMIN 3.3 (L) 08/01/2018   CALCIUM 8.7 08/01/2018   ANIONGAP 8 05/10/2018   EGFR >90 12/18/2014   No results found for: CHOL No results found for: HDL No results found for: LDLCALC No results found for: TRIG No results found for: CHOLHDL Lab Results  Component Value Date   HGBA1C 5.3 03/31/2017      Assessment & Plan:   Problem List Items Addressed This Visit      Musculoskeletal and Integument   Hidradenitis suppurativa - Primary   Relevant Medications   doxycycline (VIBRA-TABS) 100 MG tablet   chlorhexidine (HIBICLENS) 4 % external liquid   Acetaminophen-Codeine (TYLENOL/CODEINE #3) 300-30 MG tablet   Other Relevant Orders   WOUND CULTURE    Other Visit Diagnoses    Left arm pain       Relevant Medications   ibuprofen (ADVIL) 600 MG tablet      Meds ordered this encounter  Medications  . doxycycline (VIBRA-TABS) 100 MG tablet    Sig: Take 1 tablet (100 mg total) by mouth 2 (two) times daily.    Dispense:  20 tablet    Refill:  0    Order Specific Question:   Supervising Provider    Answer:   Tresa Garter [id number]  . chlorhexidine (HIBICLENS) 4 % external liquid    Sig: Apply topically daily as needed.    Dispense:  120 mL    Refill:  0    Order Specific Question:   Supervising Provider    Answer:   Tresa Garter [id number]  . Acetaminophen-Codeine (TYLENOL/CODEINE #3)  300-30 MG tablet    Sig: Take 1 tablet by mouth every 4 (four) hours as needed for pain.    Dispense:  15 tablet    Refill:  0    Order Specific Question:   Supervising Provider    Answer:   Tresa Garter [id number]  . ibuprofen (ADVIL) 600 MG tablet    Sig: Take 1 tablet (600 mg total) by mouth every 8 (eight) hours as needed.    Dispense:  30  tablet    Refill:  0    Order Specific Question:   Supervising Provider    Answer:   Tresa Garter [id number]   Hidradenitis suppurativa Will follow wound culture of left axilla. Patient advised to schedule first available appointment with dermatology to discuss possible tract removal. Advised to keep area clean and dry.  - doxycycline (VIBRA-TABS) 100 MG tablet; Take 1 tablet (100 mg total) by mouth 2 (two) times daily.  Dispense: 20 tablet; Refill: 0 - chlorhexidine (HIBICLENS) 4 % external liquid; Apply topically daily as needed.  Dispense: 120 mL; Refill: 0 - WOUND CULTURE  Left arm pain - ibuprofen (ADVIL) 600 MG tablet; Take 1 tablet (600 mg total) by mouth every 8 (eight) hours as needed.  Dispense: 30 tablet; Refill: 0 - Acetaminophen-Codeine (TYLENOL/CODEINE #3) 300-30 MG tablet; Take 1 tablet by mouth every 4 (four) hours as needed for pain.  Dispense: 15 tablet; Refill: 0    Follow-up: Return in about 2 months (around 04/16/2019).    [name]  APRN, MSN, FNP-C [facility] [address] [place], [place] [address] [phone]

## 2019-02-16 LAB — WOUND CULTURE

## 2019-02-17 NOTE — Patient Instructions (Signed)
 Hidradenitis Suppurativa Hidradenitis suppurativa is a long-term (chronic) skin disease. It is similar to a severe form of acne, but it affects areas of the body where acne would be unusual, especially areas of the body where skin rubs against skin and becomes moist. These include:  Underarms.  Groin.  Genital area.  Buttocks.  Upper thighs.  Breasts. Hidradenitis suppurativa may start out as small lumps or pimples caused by blocked sweat glands or hair follicles. Pimples may develop into deep sores that break open (rupture) and drain pus. Over time, affected areas of skin may thicken and become scarred. This condition is rare and does not spread from person to person (non-contagious). What are the causes? The exact cause of this condition is not known. It may be related to:  Female and female hormones.  An overactive disease-fighting system (immune system). The immune system may over-react to blocked hair follicles or sweat glands and cause swelling and pus-filled sores. What increases the risk? You are more likely to develop this condition if you:  Are female.  Are 11-55 years old.  Have a family history of hidradenitis suppurativa.  Have a personal history of acne.  Are overweight.  Smoke.  Take the medicine lithium. What are the signs or symptoms? The first symptoms are usually painful bumps in the skin, similar to pimples. The condition may get worse over time (progress), or it may only cause mild symptoms. If the disease progresses, symptoms may include:  Skin bumps getting bigger and growing deeper into the skin.  Bumps rupturing and draining pus.  Itchy, infected skin.  Skin getting thicker and scarred.  Tunnels under the skin (fistulas) where pus drains from a bump.  Pain during daily activities, such as pain during walking if your groin area is affected.  Emotional problems, such as stress or depression. This condition may affect your appearance and  your ability or willingness to wear certain clothes or do certain activities. How is this diagnosed? This condition is diagnosed by a health care provider who specializes in skin diseases (dermatologist). You may be diagnosed based on:  Your symptoms and medical history.  A physical exam.  Testing a pus sample for infection.  Blood tests. How is this treated? Your treatment will depend on how severe your symptoms are. The same treatment will not work for everybody with this condition. You may need to try several treatments to find what works best for you. Treatment may include:  Cleaning and bandaging (dressing) your wounds as needed.  Lifestyle changes, such as new skin care routines.  Taking medicines, such as: ? Antibiotics. ? Acne medicines. ? Medicines to reduce the activity of the immune system. ? A diabetes medicine (metformin). ? Birth control pills, for women. ? Steroids to reduce swelling and pain.  Working with a mental health care provider, if you experience emotional distress due to this condition. If you have severe symptoms that do not get better with medicine, you may need surgery. Surgery may involve:  Using a laser to clear the skin and remove hair follicles.  Opening and draining deep sores.  Removing the areas of skin that are diseased and scarred. Follow these instructions at home: Medicines   Take over-the-counter and prescription medicines only as told by your health care provider.  If you were prescribed an antibiotic medicine, take it as told by your health care provider. Do not stop taking the antibiotic even if your condition improves. Skin care  If you have open wounds,   cover them with a clean dressing as told by your health care provider. Keep wounds clean by washing them gently with soap and water when you bathe.  Do not shave the areas where you get hidradenitis suppurativa.  Do not wear deodorant.  Wear loose-fitting clothes.  Try to  avoid getting overheated or sweaty. If you get sweaty or wet, change into clean, dry clothes as soon as you can.  To help relieve pain and itchiness, cover sore areas with a warm, clean washcloth (warm compress) for 5-10 minutes as often as needed.  If told by your health care provider, take a bleach bath twice a week: ? Fill your bathtub halfway with water. ? Pour in  cup of unscented household bleach. ? Soak in the tub for 5-10 minutes. ? Only soak from the neck down. Avoid water on your face and hair. ? Shower to rinse off the bleach from your skin. General instructions  Learn as much as you can about your disease so that you have an active role in your treatment. Work closely with your health care provider to find treatments that work for you.  If you are overweight, work with your health care provider to lose weight as recommended.  Do not use any products that contain nicotine or tobacco, such as cigarettes and e-cigarettes. If you need help quitting, ask your health care provider.  If you struggle with living with this condition, talk with your health care provider or work with a mental health care provider as recommended.  Keep all follow-up visits as told by your health care provider. This is important. Where to find more information  Hidradenitis Suppurativa [employer].: https://www.hs-foundation.org/ Contact a health care provider if you have:  A flare-up of hidradenitis suppurativa.  A fever or chills.  Trouble controlling your symptoms at home.  Trouble doing your daily activities because of your symptoms.  Trouble dealing with emotional problems related to your condition. Summary  Hidradenitis suppurativa is a long-term (chronic) skin disease. It is similar to a severe form of acne, but it affects areas of the body where acne would be unusual.  The first symptoms are usually painful bumps in the skin, similar to pimples. The condition may get worse over time  (progress), or it may only cause mild symptoms.  If you have open wounds, cover them with a clean dressing as told by your health care provider. Keep wounds clean by washing them gently with soap and water when you bathe.  Besides skin care, treatment may include medicines, laser treatment, and surgery. This information is not intended to replace advice given to you by your health care provider. Make sure you discuss any questions you have with your health care provider. Document Released: 11/05/2003 Document Revised: 03/31/2017 Document Reviewed: 03/31/2017 Elsevier Patient Education  2020 [employer]  

## 2019-02-20 ENCOUNTER — Telehealth: Payer: Self-pay

## 2019-02-20 NOTE — Telephone Encounter (Signed)
-----   Message from [name], [place] sent at 02/18/2019  9:20 AM EST ----- Regarding: lab results Please inform patient that wound culture yielded beta-hemolytic strep group C.  That is typically treated with penicillin, but since patient is allergic we will continue with doxycycline 100 mg twice daily for a total of 14 days.  If ineffective, patient may warrant IV antibiotics.  We will discuss setting up outpatient.  Also, I recommend that she contact dermatologist to discuss removing Trimix in the axillary area.  With hydradenitis suppurativa, often tracks have to be removed in order to have sustained relief.  Have patient schedule a follow-up appointment in clinic.   [name]  APRN, MSN, FNP-C [facility] [address] [place], [place] [address] [phone]

## 2019-02-20 NOTE — Telephone Encounter (Signed)
Called and spoke with patient. Advised that culture showed bacteria that is beta-hemolytic strep group C which is typically treated with penicillin. Advised that since she is allergic to penicillin she needs to complete doxycycline 100 mg twice daily for the 14 days. Patient verbalized that wound is already starting to get better. I also recommended that she contact dermatology to discuss removing trimix in the axillary area.

## 2019-02-23 ENCOUNTER — Encounter (HOSPITAL_COMMUNITY): Payer: Self-pay

## 2019-02-23 ENCOUNTER — Emergency Department (HOSPITAL_COMMUNITY)
Admission: EM | Admit: 2019-02-23 | Discharge: 2019-02-23 | Disposition: A | Discharge status: Home / Self Care | Payer: Self-pay | Attending: Emergency Medicine | Admitting: Emergency Medicine

## 2019-02-23 ENCOUNTER — Other Ambulatory Visit: Payer: Self-pay

## 2019-02-23 DIAGNOSIS — Y999 Unspecified external cause status: Secondary | ICD-10-CM | POA: Insufficient documentation

## 2019-02-23 DIAGNOSIS — Y929 Unspecified place or not applicable: Secondary | ICD-10-CM | POA: Insufficient documentation

## 2019-02-23 DIAGNOSIS — S39012A Strain of muscle, fascia and tendon of lower back, initial encounter: Secondary | ICD-10-CM | POA: Insufficient documentation

## 2019-02-23 DIAGNOSIS — F17210 Nicotine dependence, cigarettes, uncomplicated: Secondary | ICD-10-CM | POA: Insufficient documentation

## 2019-02-23 DIAGNOSIS — Z79899 Other long term (current) drug therapy: Secondary | ICD-10-CM | POA: Insufficient documentation

## 2019-02-23 DIAGNOSIS — Y9389 Activity, other specified: Secondary | ICD-10-CM | POA: Insufficient documentation

## 2019-02-23 DIAGNOSIS — R109 Unspecified abdominal pain: Secondary | ICD-10-CM | POA: Insufficient documentation

## 2019-02-23 DIAGNOSIS — X500XXA Overexertion from strenuous movement or load, initial encounter: Secondary | ICD-10-CM | POA: Insufficient documentation

## 2019-02-23 DIAGNOSIS — R141 Gas pain: Secondary | ICD-10-CM | POA: Insufficient documentation

## 2019-02-23 LAB — URINALYSIS, ROUTINE W REFLEX MICROSCOPIC
Bilirubin Urine: NEGATIVE
Glucose, UA: NEGATIVE mg/dL
Hgb urine dipstick: NEGATIVE
Ketones, ur: NEGATIVE mg/dL
Nitrite: NEGATIVE
Protein, ur: NEGATIVE mg/dL
Specific Gravity, Urine: 1.016 (ref 1.005–1.030)
pH: 5 (ref 5.0–8.0)

## 2019-02-23 LAB — WET PREP, GENITAL
Clue Cells Wet Prep HPF POC: NONE SEEN
Sperm: NONE SEEN
Trich, Wet Prep: NONE SEEN
Yeast Wet Prep HPF POC: NONE SEEN

## 2019-02-23 MED ORDER — LIDOCAINE 5 % EX PTCH
2.0000 | MEDICATED_PATCH | CUTANEOUS | Status: DC
  Administered 2019-02-23: 2 via TRANSDERMAL
  Filled 2019-02-23: qty 2

## 2019-02-23 MED ORDER — LIDOCAINE 5 % EX PTCH: 1.0000 | MEDICATED_PATCH | CUTANEOUS | 0 refills | Status: DC

## 2019-02-23 MED ORDER — DICYCLOMINE HCL 10 MG/ML IM SOLN
20.0000 mg | Freq: Once | INTRAMUSCULAR | Status: AC
  Administered 2019-02-23: 20 mg via INTRAMUSCULAR
  Filled 2019-02-23: qty 2

## 2019-02-23 MED ORDER — KETOROLAC TROMETHAMINE 60 MG/2ML IM SOLN
60.0000 mg | Freq: Once | INTRAMUSCULAR | Status: AC
  Administered 2019-02-23: 60 mg via INTRAMUSCULAR
  Filled 2019-02-23: qty 2

## 2019-02-23 MED ORDER — ALUM & MAG HYDROXIDE-SIMETH 200-200-20 MG/5ML PO SUSP
30.0000 mL | Freq: Once | ORAL | Status: AC
  Administered 2019-02-23: 30 mL via ORAL
  Filled 2019-02-23: qty 30

## 2019-02-23 MED ORDER — NAPROXEN 375 MG PO TABS: 375.0000 mg | ORAL_TABLET | Freq: Two times a day (BID) | ORAL | 0 refills | Status: DC

## 2019-02-23 MED ORDER — DICYCLOMINE HCL 20 MG PO TABS: 20.0000 mg | ORAL_TABLET | Freq: Two times a day (BID) | ORAL | 0 refills | Status: DC

## 2019-02-23 MED ORDER — IBUPROFEN 800 MG PO TABS
800.0000 mg | ORAL_TABLET | Freq: Once | ORAL | Status: AC
  Administered 2019-02-23: 800 mg via ORAL
  Filled 2019-02-23: qty 1

## 2019-02-23 MED FILL — NAPROXEN 375 MG TABLET: 375 | 10 days supply | Qty: 20 | Fill #0

## 2019-02-23 MED FILL — ?LIDOCAINE 5% PATCH: 5 | 12 days supply | Qty: 12 | Fill #0

## 2019-02-23 MED FILL — DICYCLOMINE 20 MG TABLET: 20 | 10 days supply | Qty: 20 | Fill #0

## 2019-02-23 NOTE — ED Triage Notes (Signed)
Pt complains of lower back pain that radiates around to her lower abdomen Pt states she also feels very nauseated

## 2019-02-23 NOTE — ED Notes (Signed)
Requested supplies at bedside 

## 2019-02-23 NOTE — Discharge Instructions (Addendum)
Take probiotics 3 times a day

## 2019-02-23 NOTE — ED Provider Notes (Signed)
[facility] DEPT Provider Note   CSN: [id number] Arrival date & time: 02/23/19  0201     History   Chief Complaint No chief complaint on file.   HPI Amanda Davenport is a 38 y.o. female.     The history is provided by the patient.  Back Pain Location:  Sacro-iliac joint Radiates to:  Does not radiate Pain severity:  Severe Pain is:  Same all the time Onset quality:  Sudden Duration:  6 days Timing:  Constant Progression:  Unchanged Chronicity:  New Context: lifting heavy objects   Relieved by:  Nothing Worsened by:  Nothing Ineffective treatments:  Narcotics Associated symptoms: no abdominal swelling, no bladder incontinence, no bowel incontinence, no chest pain, no dysuria and no fever   Associated symptoms comment:  Loose stool and gas secondary to ongoing antibiotic use for hidradenitis.  Doxycycline  Risk factors: no hx of cancer   Helping to set up a party on Saturday and was lifting and has had pain in the low back since then.  Also has cramping and loose stool and is on doxycyline and another antibiotic for hidradenitis.  No f/c/r.    Past Medical History:  Diagnosis Date  . Allergy   . Anemia    receives transfusions periodically  . Arrhythmia   . Chronic headache   . Gout 12/2018  . Knee pain   . Nearsightedness    wears glasses  . Neuropathy   . Obesity   . Recurrent boils   . WPW (Wolff-Parkinson-White syndrome)     Patient Active Problem List   Diagnosis Date Noted  . Neuropathy 12/27/2018  . Ovarian cyst 04/29/2018  . Facial cellulitis   . Otitis externa   . Dental abscess 11/27/2016  . 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 Absolute anemia 05/21/2015  . Neck strain 12/11/2014  . Hydradenitis 12/11/2014  . Dental caries 12/11/2014  . Constipation 12/03/2014  . Neck pain 12/03/2014  . Myalgia and myositis 11/27/2014  . 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 Morbid obesity ([place]) 11/27/2014  .  Hematochezia 12/16/2012  . Wolff-Parkinson-White (WPW) syndrome 03/01/2012  . Iron deficiency anemia 02/16/2012  . Tachycardia 01/28/2012  . Hidradenitis suppurativa 08/18/2011  . AXILLARY ABSCESS 12/30/2006  . ANKLE PAIN 10/11/2006    Past Surgical History:  Procedure Laterality Date  . TONSILLECTOMY       OB History    Gravida  0   Para      Term      Preterm      AB      Living        SAB      TAB      Ectopic      Multiple      Live Births               Home Medications    Prior to Admission medications   Medication Sig Start Date End Date Taking? Authorizing Provider  Acetaminophen (TYLENOL ARTHRITIS PAIN PO) Take 600 mg by mouth.   Yes [provider]  acetaminophen (TYLENOL) 500 MG tablet Take 500 mg by mouth every 6 (six) hours as needed for mild pain.    Yes [provider]  Acetaminophen-Codeine (TYLENOL/CODEINE #3) 300-30 MG tablet Take 1 tablet by mouth every 4 (four) hours as needed for pain. 02/14/19  Yes [name], FNP  allopurinol (ZYLOPRIM) 100 MG tablet Take 1 tablet (100 mg total) by mouth daily. 01/06/19  Yes Azzie Glatter, FNP  chlorhexidine (HIBICLENS) 4 % external liquid Apply topically daily as needed. Patient taking differently: Apply 1 application topically daily as needed (cleanser).  02/14/19  Yes [name], FNP  colchicine 0.6 MG tablet Take 1 tablet (0.6 mg total) by mouth every 8 (eight) hours as needed (for gouty attacks). 01/06/19  Yes Azzie Glatter, FNP  doxycycline (VIBRA-TABS) 100 MG tablet Take 1 tablet (100 mg total) by mouth 2 (two) times daily. 02/14/19  Yes [name], FNP  FLUoxetine (PROZAC) 20 MG capsule Take 1 capsule (20 mg total) by mouth daily. 11/09/18  Yes Lanae Boast, FNP  gabapentin (NEURONTIN) 300 MG capsule Take 2 capsules (total = 600 mg), by mouth, 3 times daily. 12/27/18  Yes Azzie Glatter, FNP  ibuprofen (ADVIL) 600 MG tablet Take 1 tablet (600 mg  total) by mouth every 8 (eight) hours as needed. Patient taking differently: Take 600 mg by mouth every 8 (eight) hours as needed for moderate pain.  02/14/19  Yes [name], FNP    Family History Family History  Problem Relation Age of Onset  . Breast cancer Mother   . Pulmonary embolism Mother        died of PE  . Colon cancer Mother   . Irritable bowel syndrome Mother   . Cancer Mother   . Hypertension Father   . Diabetes Paternal Grandmother   . Heart disease Neg Hx   . Stroke Neg Hx     Social History Social History   Tobacco Use  . Smoking status: Current Some Day Smoker    Years: 0.50    Types: Cigarettes, Cigars  . Smokeless tobacco: Never Used  . Tobacco comment: smokes black and milds - last use early-mid August  Substance Use Topics  . Alcohol use: No  . Drug use: Yes    Types: Marijuana    Comment: 2+ times per month     Allergies   Other, Penicillins, Shellfish-derived products, Shrimp [shellfish allergy], and Sulfa antibiotics   Review of Systems Review of Systems  Constitutional: Negative for fever.  HENT: Negative for congestion.   Eyes: Negative for visual disturbance.  Respiratory: Negative for shortness of breath.   Cardiovascular: Negative for chest pain.  Gastrointestinal: Negative for abdominal distention, bowel incontinence, diarrhea, nausea and vomiting.  Genitourinary: Negative for bladder incontinence, dysuria, vaginal bleeding and vaginal discharge.  Musculoskeletal: Positive for back pain.  Skin: Negative for color change.  Neurological: Negative for dizziness.  Psychiatric/Behavioral: Negative for agitation.  All other systems reviewed and are negative.    Physical Exam Updated Vital Signs BP 120/74 (BP Location: Left Arm)   Pulse 95   Temp 98.1 F (36.7 C) (Oral)   Resp 17   Ht 5\' 9"  (1.753 m)   Wt 126.1 kg   LMP 02/13/2019   SpO2 100%   BMI 41.05 kg/m   Physical Exam Vitals signs and nursing note reviewed.  Exam conducted with a chaperone present.  Constitutional:      General: She is not in acute distress.    Appearance: Normal appearance.  HENT:     Head: Normocephalic and atraumatic.     Nose: Nose normal.  Eyes:     Conjunctiva/sclera: Conjunctivae normal.     Pupils: Pupils are equal, round, and reactive to light.  Neck:     Musculoskeletal: Normal range of motion and neck supple.  Cardiovascular:     Rate and Rhythm: Normal rate  and regular rhythm.     Pulses: Normal pulses.     Heart sounds: Normal heart sounds.  Pulmonary:     Effort: Pulmonary effort is normal.     Breath sounds: Normal breath sounds.  Abdominal:     General: Abdomen is flat. There is no distension.     Palpations: Abdomen is soft.     Tenderness: There is no abdominal tenderness. There is no guarding or rebound.     Hernia: No hernia is present.     Comments: Gassy throughout  Genitourinary:    General: Normal vulva.     Vagina: No vaginal discharge.     Cervix: No cervical motion tenderness or friability.  Musculoskeletal: Normal range of motion.  Skin:    General: Skin is warm and dry.     Capillary Refill: Capillary refill takes less than 2 seconds.  Neurological:     General: No focal deficit present.     Mental Status: She is alert and oriented to person, place, and time.     Deep Tendon Reflexes: Reflexes normal.  Psychiatric:        Mood and Affect: Mood normal.        Behavior: Behavior normal.      ED Treatments / Results  Labs (all labs ordered are listed, but only abnormal results are displayed) Results for orders placed or performed during the hospital encounter of 02/23/19  Wet prep, genital  Result Value Ref Range   Yeast Wet Prep HPF POC NONE SEEN NONE SEEN   Trich, Wet Prep NONE SEEN NONE SEEN   Clue Cells Wet Prep HPF POC NONE SEEN NONE SEEN   WBC, Wet Prep HPF POC FEW (A) NONE SEEN   Sperm NONE SEEN    No results found.  Radiology No results found.  Procedures  Procedures (including critical care time)  Medications Ordered in ED Medications  lidocaine (LIDODERM) 5 % 2 patch (2 patches Transdermal Patch Applied 02/23/19 0356)  dicyclomine (BENTYL) injection 20 mg (20 mg Intramuscular Given 02/23/19 0400)  alum & mag hydroxide-simeth (MAALOX/MYLANTA) 200-200-20 MG/5ML suspension 30 mL (30 mLs Oral Given 02/23/19 0400)  ibuprofen (ADVIL) tablet 800 mg (800 mg Oral Given 02/23/19 0359)  ketorolac (TORADOL) injection 60 mg (60 mg Intramuscular Given 02/23/19 0400)     Back and abdomen are separate problems.  I will treat the back pain with NSAIDs and lidoderm.  Probiotics and bentyl and gas X for the abdominal pain.  I do not believe this patient needs labs or advanced imaging at this time.    ADIYA LIVIGNI was evaluated in Emergency Department on 02/23/2019 for the symptoms described in the history of present illness. She was evaluated in the context of the global COVID-19 pandemic, which necessitated consideration that the patient might be at risk for infection with the SARS-CoV-2 virus that causes COVID-19. Institutional protocols and algorithms that pertain to the evaluation of patients at risk for COVID-19 are in a state of rapid change based on information released by regulatory bodies including the CDC and federal and state organizations. These policies and algorithms were followed during the patient's care in the ED.   Final Clinical Impressions(s) / ED Diagnoses   Return for intractable cough, coughing up blood,fevers >100.4 unrelieved by medication, shortness of breath, intractable vomiting, chest pain, shortness of breath, weakness,numbness, changes in speech, facial asymmetry,abdominal pain, passing out,Inability to tolerate liquids or food, cough, altered mental status or any concerns. No signs of systemic illness  or infection. The patient is nontoxic-appearing on exam and vital signs are within normal limits.   I have reviewed the  triage vital signs and the nursing notes. Pertinent labs &imaging results that were available during my care of the patient were reviewed by me and considered in my medical decision making (see chart for details).  After history, exam, and medical workup I feel the patient has been appropriately medically screened and is safe for discharge home. Pertinent diagnoses were discussed with the patient. Patient was given return precautions      Dmauri Rosenow, MD 02/23/19 [phone]

## 2019-02-24 LAB — GC/CHLAMYDIA PROBE AMP (~~LOC~~) NOT AT ARMC
Chlamydia: NEGATIVE
Neisseria Gonorrhea: NEGATIVE

## 2019-03-07 ENCOUNTER — Other Ambulatory Visit: Payer: Self-pay | Admitting: Family Medicine

## 2019-03-07 ENCOUNTER — Telehealth: Payer: Self-pay

## 2019-03-07 DIAGNOSIS — B379 Candidiasis, unspecified: Secondary | ICD-10-CM

## 2019-03-07 MED ORDER — FLUCONAZOLE 150 MG PO TABS: 150.0000 mg | ORAL_TABLET | Freq: Once | ORAL | 0 refills | Status: AC

## 2019-03-07 MED FILL — FLUCONAZOLE 150 MG TABLET: 150 | 1 days supply | Qty: 1 | Fill #0

## 2019-03-07 NOTE — Telephone Encounter (Signed)
Patient called and is asking for a [facility] to [facility]x giving her a yeast infection

## 2019-03-07 NOTE — Telephone Encounter (Signed)
Called and informed that rx has been sent. Thanks!

## 2019-03-14 ENCOUNTER — Encounter: Payer: Self-pay | Admitting: Gastroenterology

## 2019-03-14 ENCOUNTER — Ambulatory Visit (INDEPENDENT_AMBULATORY_CARE_PROVIDER_SITE_OTHER): Payer: Self-pay | Admitting: Family Medicine

## 2019-03-14 ENCOUNTER — Encounter: Payer: Self-pay | Admitting: Family Medicine

## 2019-03-14 ENCOUNTER — Other Ambulatory Visit: Payer: Self-pay

## 2019-03-14 VITALS — BP 101/62 | HR 102 | Temp 98.2°F | Ht 69.0 in | Wt 270.2 lb

## 2019-03-14 DIAGNOSIS — R197 Diarrhea, unspecified: Secondary | ICD-10-CM

## 2019-03-14 DIAGNOSIS — Z09 Encounter for follow-up examination after completed treatment for conditions other than malignant neoplasm: Secondary | ICD-10-CM

## 2019-03-14 DIAGNOSIS — M109 Gout, unspecified: Secondary | ICD-10-CM

## 2019-03-14 DIAGNOSIS — G629 Polyneuropathy, unspecified: Secondary | ICD-10-CM

## 2019-03-14 DIAGNOSIS — L732 Hidradenitis suppurativa: Secondary | ICD-10-CM

## 2019-03-14 DIAGNOSIS — F419 Anxiety disorder, unspecified: Secondary | ICD-10-CM

## 2019-03-14 DIAGNOSIS — R1084 Generalized abdominal pain: Secondary | ICD-10-CM

## 2019-03-14 DIAGNOSIS — Z0000 Encounter for general adult medical examination without abnormal findings: Secondary | ICD-10-CM

## 2019-03-14 LAB — POCT URINALYSIS DIPSTICK
Glucose, UA: NEGATIVE
Ketones, UA: NEGATIVE
Leukocytes, UA: NEGATIVE
Nitrite, UA: NEGATIVE
Protein, UA: POSITIVE — AB
Spec Grav, UA: >=1.03 — AB (ref 1.010–1.025)
Urobilinogen, UA: 0.2 E.U./dL
pH, UA: 5 (ref 5.0–8.0)

## 2019-03-14 LAB — GLUCOSE, POCT (MANUAL RESULT ENTRY): POC Glucose: 121 mg/dl — AB (ref 70–99)

## 2019-03-14 LAB — POCT GLYCOSYLATED HEMOGLOBIN (HGB A1C): Hemoglobin A1C: 5.1 % (ref 4.0–5.6)

## 2019-03-14 NOTE — Progress Notes (Signed)
[name]    Established Patient Office Visit  Subjective:  Patient ID: Amanda Davenport, female    DOB: 01/02/1981  Age: 38 y.o. MRN: [id number]  CC:  Chief Complaint  Patient presents with  . Hospitalization Follow-up    ED 11/19/220 Back sprain & abdominal pain    HPI Amanda Davenport is a 38 year old female who presents for Follow Up today.   Past Medical History:  Diagnosis Date  . Allergy   . Anemia    receives transfusions periodically  . Arrhythmia   . Chronic headache   . Gout 12/2018  . Knee pain   . Nearsightedness    wears glasses  . Neuropathy   . Obesity   . Recurrent boils   . WPW (Wolff-Parkinson-White syndrome)    Current Status: Since her last office visit, she has continued to have chronic Hidradenitis skin issues. She was previously referred to a physician who specializes in plastic surgery, Dr. Audelia Hives for evaluation of chronic hidradenitis. She states that she was evaluated at her office, but referred to another physician, which patient hasn't heard from as of yet.  Her anxiety is increased r/t continued skin infections. She states that she has discontinued Prozac. She denies suicidal ideations, homicidal ideations, or auditory hallucinations. She denies fevers, chills, fatigue, recent infections, weight loss, and night sweats. She has not had any headaches, visual changes, dizziness, and falls. No chest pain, heart palpitations, cough and shortness of breath reported. No reports of GI problems such as nausea, vomiting, diarrhea, and constipation. She has no reports of blood in stools, dysuria and hematuria. She denies pain today.   Past Surgical History:  Procedure Laterality Date  . TONSILLECTOMY      Family History  Problem Relation Age of Onset  . Breast cancer Mother   . Pulmonary embolism Mother        died of PE  . Colon cancer Mother   . Irritable bowel syndrome Mother   . Cancer  Mother   . Hypertension Father   . Diabetes Paternal Grandmother   . Heart disease Neg Hx   . Stroke Neg Hx     Social History   Socioeconomic History  . Marital status: Single    Spouse name: Not on file  . Number of children: Not on file  . Years of education: Not on file  . Highest education level: Not on file  Occupational History  . Not on file  Social Needs  . Financial resource strain: Not on file  . Food insecurity    Worry: Not on file    Inability: Not on file  . Transportation needs    Medical: Not on file    Non-medical: Not on file  Tobacco Use  . Smoking status: Current Some Day Smoker    Years: 0.50    Types: Cigarettes, Cigars  . Smokeless tobacco: Never Used  . Tobacco comment: smokes black and milds - last use early-mid August  Substance and Sexual Activity  . Alcohol use: No  . Drug use: Yes    Types: Marijuana    Comment: 2+ times per month  . Sexual activity: Yes    Birth control/protection: None, Condom  Lifestyle  . Physical activity    Days per week: Not on file    Minutes per session: Not on file  . Stress: Not on file  Relationships  . Social connections  Talks on phone: Not on file    Gets together: Not on file    Attends religious service: Not on file    Active member of club or organization: Not on file    Attends meetings of clubs or organizations: Not on file    Relationship status: Not on file  . Intimate partner violence    Fear of current or ex partner: Not on file    Emotionally abused: Not on file    Physically abused: Not on file    Forced sexual activity: Not on file  Other Topics Concern  . Not on file  Social History Narrative  . Not on file    Outpatient Medications Prior to Visit  Medication Sig Dispense Refill  . Acetaminophen (TYLENOL ARTHRITIS PAIN PO) Take 600 mg by mouth.    Marland Kitchen acetaminophen (TYLENOL) 500 MG tablet Take 500 mg by mouth every 6 (six) hours as needed for mild pain.     .  Acetaminophen-Codeine (TYLENOL/CODEINE #3) 300-30 MG tablet Take 1 tablet by mouth every 4 (four) hours as needed for pain. 15 tablet 0  . allopurinol (ZYLOPRIM) 100 MG tablet Take 1 tablet (100 mg total) by mouth daily. 30 tablet 6  . chlorhexidine (HIBICLENS) 4 % external liquid Apply topically daily as needed. (Patient taking differently: Apply 1 application topically daily as needed (cleanser). ) 120 mL 0  . colchicine 0.6 MG tablet Take 1 tablet (0.6 mg total) by mouth every 8 (eight) hours as needed (for gouty attacks). 30 tablet 3  . gabapentin (NEURONTIN) 300 MG capsule Take 2 capsules (total = 600 mg), by mouth, 3 times daily. 180 capsule 3  . ibuprofen (ADVIL) 600 MG tablet Take 1 tablet (600 mg total) by mouth every 8 (eight) hours as needed. (Patient taking differently: Take 600 mg by mouth every 8 (eight) hours as needed for moderate pain. ) 30 tablet 0  . naproxen (NAPROSYN) 375 MG tablet Take 1 tablet (375 mg total) by mouth 2 (two) times daily. 20 tablet 0  . dicyclomine (BENTYL) 20 MG tablet Take 1 tablet (20 mg total) by mouth 2 (two) times daily. (Patient not taking: Reported on 03/14/2019) 20 tablet 0  . FLUoxetine (PROZAC) 20 MG capsule Take 1 capsule (20 mg total) by mouth daily. (Patient not taking: Reported on 03/14/2019) 30 capsule 3  . lidocaine (LIDODERM) 5 % Place 1 patch onto the skin daily. Remove & Discard patch within 12 hours or as directed by MD (Patient not taking: Reported on 03/14/2019) 12 patch 0  . doxycycline (VIBRA-TABS) 100 MG tablet Take 1 tablet (100 mg total) by mouth 2 (two) times daily. 20 tablet 0   No facility-administered medications prior to visit.     Allergies  Allergen Reactions  . Other Other (See Comments)    All Antibiotics cause severe vaginal yeast infections  . Penicillins Hives, Itching and Swelling    Has patient had a PCN reaction causing immediate rash, facial/tongue/throat swelling, SOB or lightheadedness with hypotension: Yes Has  patient had a PCN reaction causing severe rash involving mucus membranes or skin necrosis: Yes Has patient had a PCN reaction that required hospitalization Yes Has patient had a PCN reaction occurring within the last 10 years: Yes If all of the above answers are "NO", then may proceed with Cephalosporin use.   . Shellfish-Derived Products Hives  . Shrimp [Shellfish Allergy] Hives  . Sulfa Antibiotics Rash    ROS Review of Systems  Constitutional: Negative.  HENT: Negative.   Eyes: Negative.   Respiratory: Negative.   Cardiovascular: Negative.   Gastrointestinal: Positive for abdominal distention.  Endocrine: Negative.   Genitourinary: Negative.   Musculoskeletal: Positive for arthralgias (generalized).  Skin:       Chronic Hidradenitis  Allergic/Immunologic: Negative.   Neurological: Negative.   Hematological: Negative.   Psychiatric/Behavioral: The patient is nervous/anxious.       Objective:    Physical Exam  Constitutional: She is oriented to person, place, and time. She appears well-developed and well-nourished.  HENT:  Head: Normocephalic and atraumatic.  Eyes: Conjunctivae are normal.  Neck: Normal range of motion. Neck supple.  Cardiovascular: Normal rate, regular rhythm, normal heart sounds and intact distal pulses.  Pulmonary/Chest: Effort normal and breath sounds normal.  Abdominal: Soft. Bowel sounds are normal.  Musculoskeletal: Normal range of motion.  Neurological: She is alert and oriented to person, place, and time. She has normal reflexes.  Skin: Skin is warm and dry.  Chronic Hidradenitis located bilaterally in axilla, abdomen, and other skins folds.   Psychiatric: She has a normal mood and affect. Her behavior is normal. Judgment and thought content normal.  Nursing note and vitals reviewed.   BP 101/62   Pulse (!) 102   Temp 98.2 F (36.8 C) (Oral)   Ht 5' 9"  (1.753 m)   Wt 270 lb 3.2 oz (122.6 kg)   LMP 02/13/2019   BMI 39.90 kg/m  Wt  Readings from Last 3 Encounters:  03/14/19 270 lb 3.2 oz (122.6 kg)  02/23/19 278 lb (126.1 kg)  02/14/19 271 lb (122.9 kg)     There are no preventive care reminders to display for this patient.  There are no preventive care reminders to display for this patient.  Lab Results  Component Value Date   TSH 1.620 11/24/2017   Lab Results  Component Value Date   WBC 7.5 11/09/2018   HGB 10.9 (L) 11/09/2018   HCT 34.8 11/09/2018   MCV 85 11/09/2018   PLT 510 (H) 11/09/2018   Lab Results  Component Value Date   NA 140 08/01/2018   K 4.0 08/01/2018   CHLORIDE 109 12/18/2014   CO2 21 08/01/2018   GLUCOSE 87 08/01/2018   BUN 7 08/01/2018   CREATININE 0.77 08/01/2018   BILITOT 0.2 08/01/2018   ALKPHOS 75 08/01/2018   AST 12 08/01/2018   ALT 7 08/01/2018   PROT 7.8 08/01/2018   ALBUMIN 3.3 (L) 08/01/2018   CALCIUM 8.7 08/01/2018   ANIONGAP 8 05/10/2018   EGFR >90 12/18/2014   No results found for: CHOL No results found for: HDL No results found for: LDLCALC No results found for: TRIG No results found for: CHOLHDL Lab Results  Component Value Date   HGBA1C 5.1 03/14/2019    Assessment & Plan:   She was previously referred to for evaluation of of chronic hidradenitis. 1. Generalized abdominal pain Abdominal pain probable r/t chronic use of antibiotics because of chronic Hidradenitis.  - Ambulatory referral to Gastroenterology  2. Diarrhea, unspecified type - Ambulatory referral to Gastroenterology  3. Hidradenitis suppurativa Stable. Moderately controlled by chronic antibiotic use.  We will contact Dr. Lyndee Leo Dillingham's office to find out which surgeon she referred patient.  4. Gout, unspecified cause, unspecified chronicity, unspecified site Stable. No recent attacks reported.   5. Neuropathy  6. Anxiety Anxiety is increased. Patient is encouraged to restart Prozac and take as prescribed.   7. Health care maintenance - POCT HgB A1C - Urinalysis  Dipstick - Glucose (CBG)  8. Follow up She will follow up in 3 months.   No orders of the defined types were placed in this encounter.   Orders Placed This Encounter  Procedures  . Ambulatory referral to Gastroenterology  . POCT HgB A1C  . Urinalysis Dipstick  . Glucose (CBG)     Referral Orders     Ambulatory referral to Gastroenterology   Kathe Becton,  MSN, FNP-BC [address], [place] [address] [phone] fax   Problem List Items Addressed This Visit      Nervous and Auditory   Neuropathy     Musculoskeletal and Integument   Hidradenitis suppurativa    Other Visit Diagnoses    Generalized abdominal pain    -  Primary   Relevant Orders   Ambulatory referral to Gastroenterology   Diarrhea, unspecified type       Relevant Orders   Ambulatory referral to Gastroenterology   Gout, unspecified cause, unspecified chronicity, unspecified site       Health care maintenance       Relevant Orders   POCT HgB A1C (Completed)   Urinalysis Dipstick (Completed)   Glucose (CBG) (Completed)   Follow up          No orders of the defined types were placed in this encounter.   Follow-up: No follow-ups on file.    Azzie Glatter, FNP

## 2019-03-15 ENCOUNTER — Telehealth: Payer: Self-pay

## 2019-03-15 DIAGNOSIS — R1084 Generalized abdominal pain: Secondary | ICD-10-CM | POA: Insufficient documentation

## 2019-03-15 IMAGING — CR DG FOOT COMPLETE 3+V*L*
3 series · 3 of 3 positions shown · non-contrast
Comparison: Prior radiographs of the left ankle 07/08/2011

CLINICAL DATA: 35-year-old female with pain along the lateral
aspect of the fifth metatarsal for the past month

EXAM:
LEFT FOOT - COMPLETE 3+ VIEW

[x foot ap left]
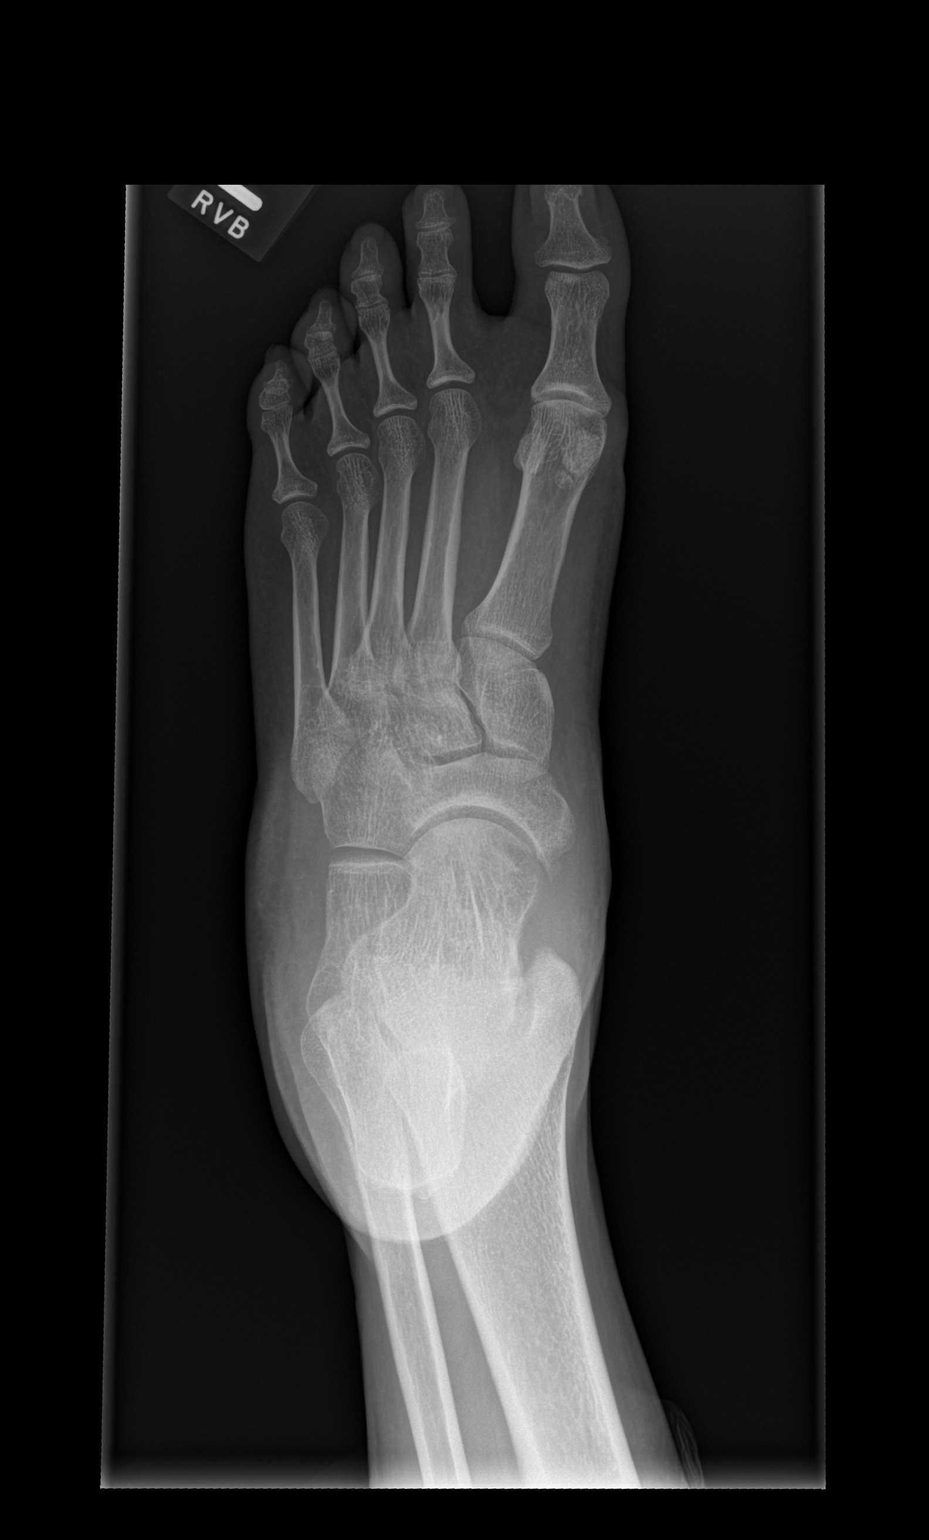

[x foot obl left]
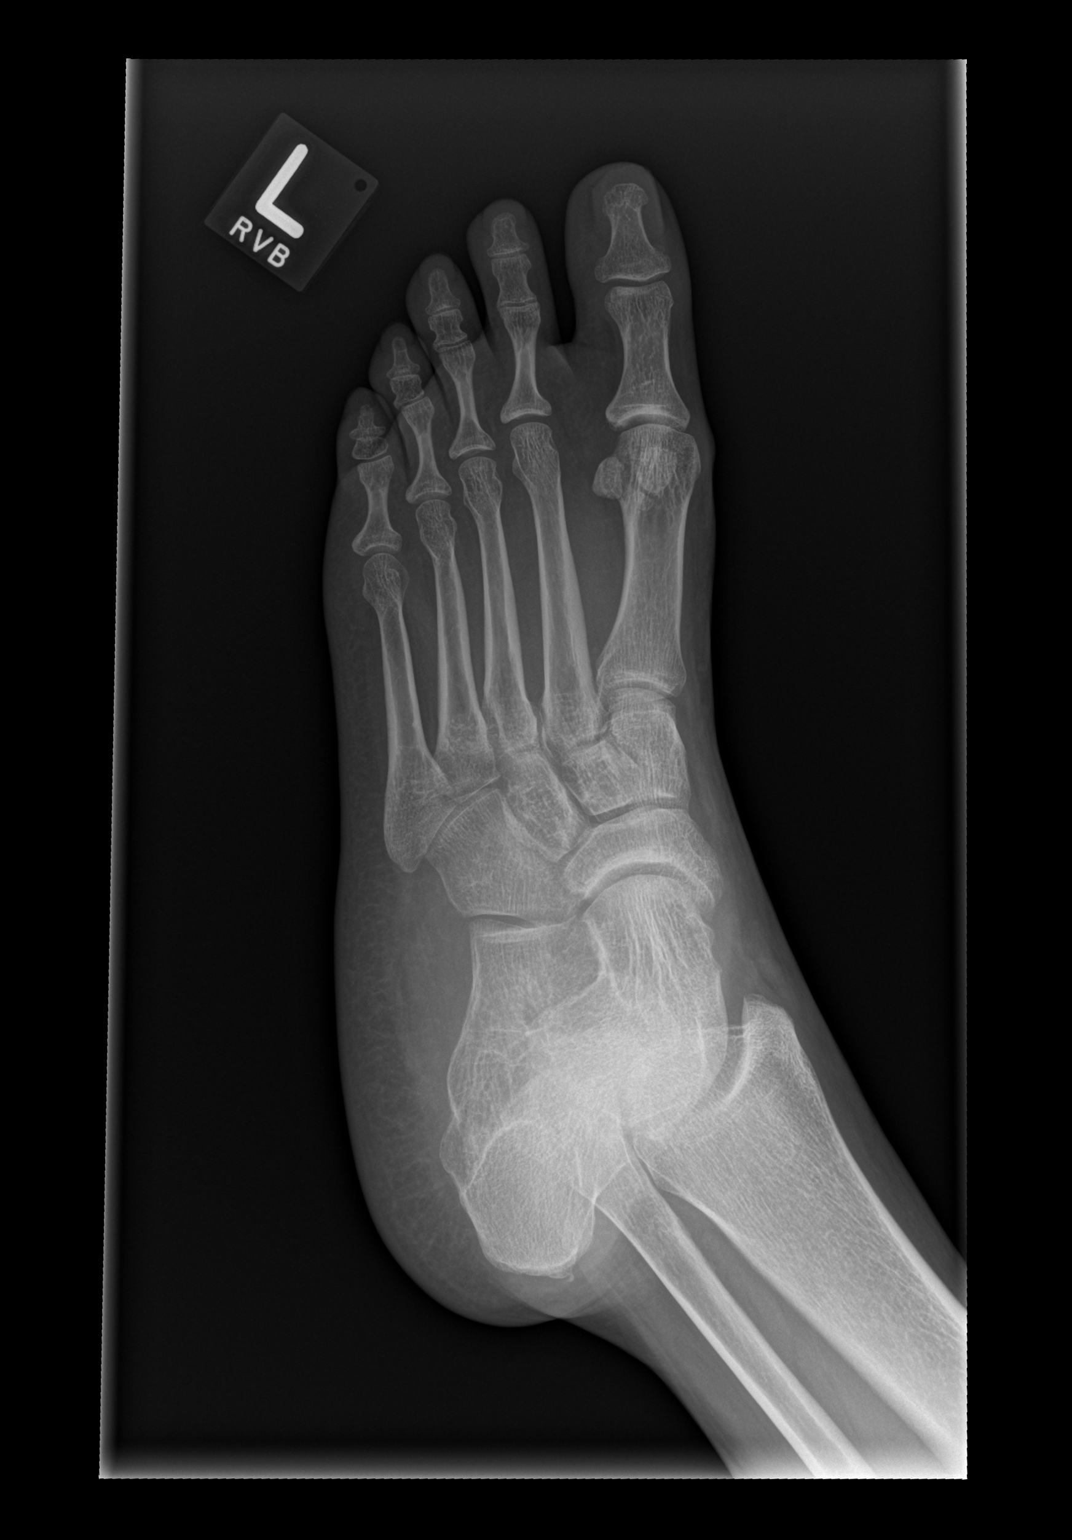

[x foot lat left]
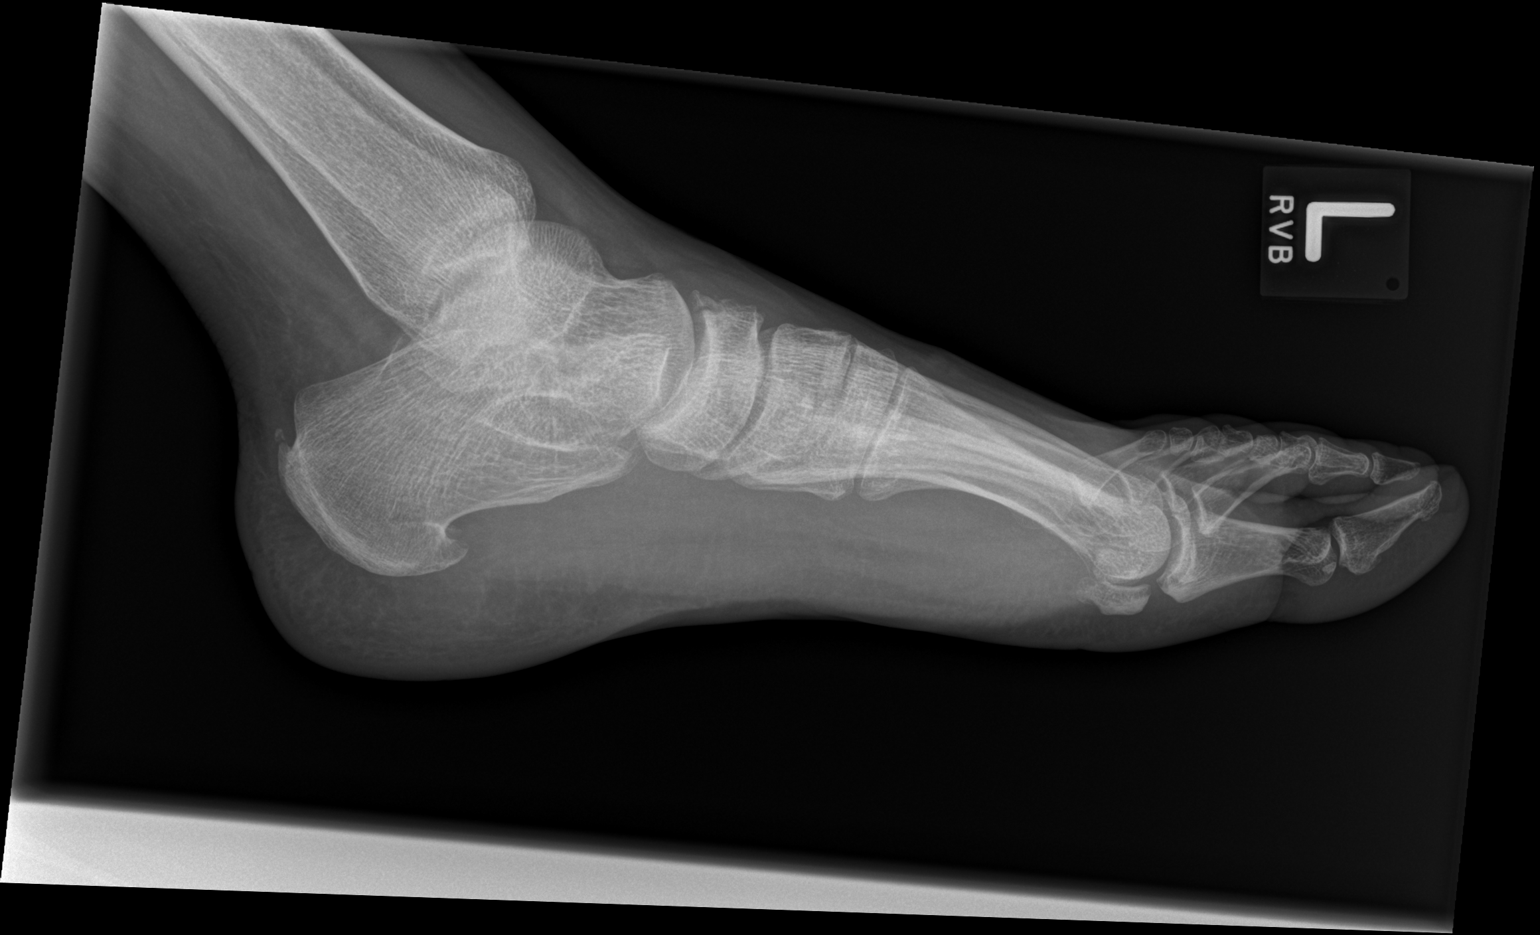

[3 of 3 positions shown; findings below may reference images not displayed]

FINDINGS: There is no evidence of fracture or dislocation. There is no
evidence of arthropathy or other focal bone abnormality. Soft
tissues are unremarkable.
IMPRESSION: Negative.

## 2019-03-15 NOTE — Telephone Encounter (Signed)
Per Dr. Elenor Quinones office patient case was declined. She does not do hidradenitis need to see General Surgery.   Kathe Becton to place referral today to General Surgery.  Patient will be informed.   referred her to General Surgery.  ===View-only below this line=== ----- Message ----- From: Azzie Glatter, FNP Sent: 03/15/2019   6:40 AM EST To: Jorja Loa, RMA Subject: "Surgeon Referral"                             Please contact Dr. Lyndee Leo Dillingham's office to find out which surgeon she referred patient. Please have office notes faxed to our office also. Thank you.

## 2019-03-16 ENCOUNTER — Other Ambulatory Visit: Payer: Self-pay

## 2019-03-16 ENCOUNTER — Encounter (HOSPITAL_COMMUNITY): Payer: Self-pay

## 2019-03-16 ENCOUNTER — Other Ambulatory Visit: Payer: Self-pay | Admitting: Family Medicine

## 2019-03-16 ENCOUNTER — Emergency Department (HOSPITAL_COMMUNITY)
Admission: EM | Admit: 2019-03-16 | Discharge: 2019-03-17 | Disposition: A | Discharge status: Home / Self Care | Payer: Medicaid Other | Attending: Emergency Medicine | Admitting: Emergency Medicine

## 2019-03-16 DIAGNOSIS — G8929 Other chronic pain: Secondary | ICD-10-CM | POA: Diagnosis not present

## 2019-03-16 DIAGNOSIS — Z20828 Contact with and (suspected) exposure to other viral communicable diseases: Secondary | ICD-10-CM | POA: Insufficient documentation

## 2019-03-16 DIAGNOSIS — Z79899 Other long term (current) drug therapy: Secondary | ICD-10-CM | POA: Diagnosis not present

## 2019-03-16 DIAGNOSIS — F1210 Cannabis abuse, uncomplicated: Secondary | ICD-10-CM | POA: Diagnosis not present

## 2019-03-16 DIAGNOSIS — M792 Neuralgia and neuritis, unspecified: Secondary | ICD-10-CM | POA: Diagnosis not present

## 2019-03-16 DIAGNOSIS — F17210 Nicotine dependence, cigarettes, uncomplicated: Secondary | ICD-10-CM | POA: Diagnosis not present

## 2019-03-16 DIAGNOSIS — L732 Hidradenitis suppurativa: Secondary | ICD-10-CM

## 2019-03-16 DIAGNOSIS — M791 Myalgia, unspecified site: Secondary | ICD-10-CM | POA: Diagnosis present

## 2019-03-16 DIAGNOSIS — R6889 Other general symptoms and signs: Secondary | ICD-10-CM | POA: Insufficient documentation

## 2019-03-16 NOTE — ED Triage Notes (Addendum)
Pt reports generalized body aches and nausea that worsened Wednesday. She was seen by her PCP Wednesday as well. Endorses cough and fever at home. Denies any known COVID exposures. Also reports abdominal discomfort and feet swelling. Denies SOB.

## 2019-03-17 ENCOUNTER — Emergency Department (HOSPITAL_COMMUNITY): Payer: Medicaid Other

## 2019-03-17 ENCOUNTER — Telehealth (HOSPITAL_COMMUNITY): Payer: Self-pay | Admitting: Emergency Medicine

## 2019-03-17 ENCOUNTER — Telehealth: Payer: Self-pay

## 2019-03-17 ENCOUNTER — Other Ambulatory Visit: Payer: Self-pay | Admitting: Family Medicine

## 2019-03-17 ENCOUNTER — Telehealth: Payer: Self-pay | Admitting: *Deleted

## 2019-03-17 DIAGNOSIS — L732 Hidradenitis suppurativa: Secondary | ICD-10-CM

## 2019-03-17 LAB — SARS CORONAVIRUS 2 (TAT 6-24 HRS): SARS Coronavirus 2: NEGATIVE

## 2019-03-17 LAB — POC SARS CORONAVIRUS 2 AG -  ED: SARS Coronavirus 2 Ag: NEGATIVE

## 2019-03-17 LAB — I-STAT BETA HCG BLOOD, ED (MC, WL, AP ONLY): I-stat hCG, quantitative: <5 m[IU]/mL (ref <5)

## 2019-03-17 MED ORDER — HYDROCODONE-ACETAMINOPHEN 5-325 MG PO TABS: 1.0000 | ORAL_TABLET | Freq: Four times a day (QID) | ORAL | 0 refills | Status: DC | PRN

## 2019-03-17 MED ORDER — DOXYCYCLINE HYCLATE 100 MG PO TABS
100.0000 mg | ORAL_TABLET | Freq: Once | ORAL | Status: AC
  Administered 2019-03-17: 100 mg via ORAL
  Filled 2019-03-17: qty 1

## 2019-03-17 MED ORDER — ONDANSETRON 8 MG PO TBDP
8.0000 mg | ORAL_TABLET | Freq: Once | ORAL | Status: AC
  Administered 2019-03-17: 8 mg via ORAL
  Filled 2019-03-17: qty 1

## 2019-03-17 MED ORDER — HYDROCODONE-ACETAMINOPHEN 5-325 MG PO TABS
2.0000 | ORAL_TABLET | Freq: Once | ORAL | Status: AC
  Administered 2019-03-17: 2 via ORAL
  Filled 2019-03-17: qty 2

## 2019-03-17 MED ORDER — DOXYCYCLINE HYCLATE 100 MG PO CAPS: 100.0000 mg | ORAL_CAPSULE | Freq: Two times a day (BID) | ORAL | 0 refills | Status: DC

## 2019-03-17 MED FILL — ?DOXYCYCLINE HYCLATE 100MG: 100 | 7 days supply | Qty: 14 | Fill #0

## 2019-03-17 NOTE — ED Provider Notes (Signed)
[name] DEPT Provider Note: [name], MD, FACEP  CSN: [id number] MRN: [id number] ARRIVAL: 03/16/19 at [facility]   HISTORY OF PRESENT ILLNESS  03/17/19 12:26 AM Amanda Davenport is a 38 y.o. female with a 2-day history of flulike symptoms.  Specifically she has had fever, body aches, joint aches, nonproductive cough and nausea.  She has been taking naproxen and gabapentin for her chronic neuropathy but these are not keeping her pain in check.  She rates her pain as an 8 out of 10 and aching in nature.  She does not know if she has had any Covid exposures.  She has not been tested for Covid.  She is equivocal about having shortness of breath.  The pain currently is debilitating enough she has needed assistance getting around the house.  She is equivocal about loss of smell and taste.    Past Medical History:  Diagnosis Date  . Allergy   . Anemia    receives transfusions periodically  . Arrhythmia   . Chronic headache   . Gout 12/2018  . Knee pain   . Nearsightedness    wears glasses  . Neuropathy   . Obesity   . Recurrent boils   . WPW (Wolff-Parkinson-White syndrome)     Past Surgical History:  Procedure Laterality Date  . TONSILLECTOMY      Family History  Problem Relation Age of Onset  . Breast cancer Mother   . Pulmonary embolism Mother        died of PE  . Colon cancer Mother   . Irritable bowel syndrome Mother   . Cancer Mother   . Hypertension Father   . Diabetes Paternal Grandmother   . Heart disease Neg Hx   . Stroke Neg Hx     Social History   Tobacco Use  . Smoking status: Current Some Day Smoker    Years: 0.50    Types: Cigarettes, Cigars  . Smokeless tobacco: Never Used  . Tobacco comment: smokes black and milds - last use early-mid August  Substance Use Topics  . Alcohol use: No  . Drug use: Yes    Types: Marijuana    Comment: 2+ times per month    Prior to Admission  medications   Medication Sig Start Date End Date Taking? Authorizing Provider  acetaminophen (TYLENOL) 500 MG tablet Take 500 mg by mouth every 6 (six) hours as needed for mild pain.    Yes [provider]  Acetaminophen-Codeine (TYLENOL/CODEINE #3) 300-30 MG tablet Take 1 tablet by mouth every 4 (four) hours as needed for pain. 02/14/19  Yes [name], FNP  allopurinol (ZYLOPRIM) 100 MG tablet Take 1 tablet (100 mg total) by mouth daily. 01/06/19  Yes Azzie Glatter, FNP  chlorhexidine (HIBICLENS) 4 % external liquid Apply topically daily as needed. Patient taking differently: Apply 1 application topically daily as needed (cleanser).  02/14/19  Yes [name], FNP  colchicine 0.6 MG tablet Take 1 tablet (0.6 mg total) by mouth every 8 (eight) hours as needed (for gouty attacks). 01/06/19  Yes Azzie Glatter, FNP  FLUoxetine (PROZAC) 20 MG capsule Take 1 capsule (20 mg total) by mouth daily. 11/09/18  Yes Lanae Boast, FNP  gabapentin (NEURONTIN) 300 MG capsule Take 2 capsules (total = 600 mg), by mouth, 3 times daily. 12/27/18  Yes Azzie Glatter, FNP  ibuprofen (ADVIL) 600 MG tablet Take 1 tablet (600 mg  total) by mouth every 8 (eight) hours as needed. Patient taking differently: Take 600 mg by mouth every 8 (eight) hours as needed for moderate pain.  02/14/19  Yes [name], FNP  dicyclomine (BENTYL) 20 MG tablet Take 1 tablet (20 mg total) by mouth 2 (two) times daily. Patient not taking: Reported on 03/17/2019 02/23/19   Palumbo, April, MD  lidocaine (LIDODERM) 5 % Place 1 patch onto the skin daily. Remove & Discard patch within 12 hours or as directed by MD Patient not taking: Reported on 03/14/2019 02/23/19   Palumbo, April, MD  naproxen (NAPROSYN) 375 MG tablet Take 1 tablet (375 mg total) by mouth 2 (two) times daily. Patient not taking: Reported on 03/17/2019 02/23/19   Palumbo, April, MD    Allergies Other, Penicillins, Shellfish-derived products,  Shrimp [shellfish allergy], and Sulfa antibiotics   REVIEW OF SYSTEMS  Negative except as noted here or in the History of Present Illness.   PHYSICAL EXAMINATION  Initial Vital Signs Blood pressure (!) 94/57, pulse 99, temperature 98.5 F (36.9 C), temperature source Oral, resp. rate 16, height _0  (1.753 m), weight 122.5 kg, SpO2 99 %.  Examination General: Well-developed, well-nourished female in no acute distress; appearance consistent with age of record HENT: normocephalic; atraumatic Eyes: pupils equal, round and reactive to light; extraocular muscles intact Neck: supple Heart: regular rate and rhythm Lungs: clear to auscultation bilaterally Abdomen: soft; nondistended; nontender; bowel sounds present Extremities: No deformity; generalized pain on movement of extremities Neurologic: Awake, alert and oriented; motor function intact in all extremities and symmetric; no facial droop Skin: Warm and dry Psychiatric: Normal mood and affect   RESULTS  Summary of this visit's results, reviewed and interpreted by myself:   EKG Interpretation  Date/Time:    Ventricular Rate:    PR Interval:    QRS Duration:   QT Interval:    QTC Calculation:   R Axis:     Text Interpretation:        Laboratory Studies: Results for orders placed or performed during the hospital encounter of 03/16/19 (from the past 24 hour(s))  POC SARS Coronavirus 2 Ag-ED - Nasal Swab (BD Veritor Kit)     Status: None   Collection Time: 03/17/19  1:23 AM  Result Value Ref Range   SARS Coronavirus 2 Ag NEGATIVE NEGATIVE  I-Stat Beta hCG blood, ED ([facility], [facility], [facility] only)     Status: None   Collection Time: 03/17/19  2:59 AM  Result Value Ref Range   I-stat hCG, quantitative <5.0 <5 mIU/mL   Comment 3           Imaging Studies: DG Chest Port 1 View  Result Date: 03/17/2019 CLINICAL DATA:  Cough, fever, body aches. EXAM: PORTABLE CHEST 1 VIEW COMPARISON:  Radiographs and CT 05/10/2018 FINDINGS: Lung  volumes are low. Upper normal heart size likely accentuated by portable technique. Minimal vague bibasilar opacities. No pulmonary edema, pleural effusion or pneumothorax. IMPRESSION: Low lung volumes with minimal vague bibasilar opacities, favoring atelectasis but may represent pneumonia in the appropriate clinical setting. Electronically Signed   By: Keith Rake M.D.   On: 03/17/2019 01:28    ED COURSE and MDM  Nursing notes, initial and subsequent vitals signs, including pulse oximetry, reviewed and interpreted by myself.  Vitals:   03/16/19 2320 03/17/19 0100  BP: (!) 94/57 96/65  Pulse: 99 89  Resp: 16 18  Temp: 98.5 F (36.9 C) 98.3 F (36.8 C)  TempSrc: Oral Oral  SpO2: 99% 100%  Weight: 122.5 kg   Height: _0  (1.753 m)    Medications  doxycycline (VIBRA-TABS) tablet 100 mg (has no administration in time range)  HYDROcodone-acetaminophen (NORCO/VICODIN) 5-325 MG per tablet 2 tablet (2 tablets Oral Given 03/17/19 0055)  ondansetron (ZOFRAN-ODT) disintegrating tablet 8 mg (8 mg Oral Given 03/17/19 0056)   3:18 AM Patient is Covid point-of-care test is negative, confirmatory test sent.  Patient symptoms could represent Covid infection with viral pneumonia but we will treat her for a possible early bacterial pneumonia.  We will also treat her pain as her current illness is exacerbating her chronic neuropathic pain.  Amanda Davenport was evaluated in Emergency Department on 03/17/2019 for the symptoms described in the history of present illness. Shewas evaluated in the context of the global COVID-19 pandemic, which necessitated consideration that the patient might be at risk for infection with the SARS-CoV-2 virus that causes COVID-19. Institutional protocols and algorithms that pertain to the evaluation of patients at risk for COVID-19 are in a state of rapid change based on information released by regulatory bodies including the CDC and federal and state organizations. These  policies and algorithms were followed during the patient's care in the ED.   PROCEDURES  Procedures   ED DIAGNOSES     ICD-10-CM   1. Flu-like symptoms  R68.89   2. Chronic neuropathic pain  M79.2    G89.29        Annalissa Murphey, Jenny Reichmann, MD 03/17/19 [phone]

## 2019-03-17 NOTE — Telephone Encounter (Signed)
Can you please send pt's rx for Norco to the [facility] on the corner of [address] per pt request.  The [facility] [address] does not dispense narcotics and we will delete the rx.  Thanks.

## 2019-03-17 NOTE — Discharge Instructions (Addendum)
Your rapid Covid test was negative in the ED so confirmatory test has been sent.  It should result within the next 24 hours.  In the meantime we will treat you with doxycycline for possible bacterial pneumonia.

## 2019-03-17 NOTE — Telephone Encounter (Signed)
TOC CM received a call from pt stating she needs Norco Rx transferred to [facility]. [facility] does not fill narcotics. TOC CM reviewed notes and prescribing EDP unable to send from home. CM did reach out to current EDP, Dr Wyvonnia Dusky. Explained to pt to contact her PCP, Kathe Becton NP to see if office will send Rx for pain medication and to follow up with an appt to see PCP next week. [place], [facility] ED TOC CM [phone]

## 2019-03-17 NOTE — Telephone Encounter (Addendum)
Needs Norco sent to different pharmacy.  Epic will not let me sign Rx from home, I will not be at work for 5 days. Failure is Epic's fault, not mine.

## 2019-03-17 NOTE — Telephone Encounter (Signed)
Re: Enrique Sack Rx for Norco  I am at home, not at work again until Wednesday. Epic will not let me sign a narcotic prescription from home. Epic fails another patient. Not my fault.

## 2019-03-20 ENCOUNTER — Emergency Department (HOSPITAL_COMMUNITY): Payer: Medicaid Other

## 2019-03-20 ENCOUNTER — Emergency Department (HOSPITAL_COMMUNITY)
Admission: EM | Admit: 2019-03-20 | Discharge: 2019-03-20 | Disposition: A | Discharge status: Home / Self Care | Payer: Medicaid Other | Attending: Emergency Medicine | Admitting: Emergency Medicine

## 2019-03-20 ENCOUNTER — Telehealth: Payer: Self-pay

## 2019-03-20 ENCOUNTER — Encounter (HOSPITAL_COMMUNITY): Payer: Self-pay | Admitting: Emergency Medicine

## 2019-03-20 ENCOUNTER — Other Ambulatory Visit: Payer: Self-pay

## 2019-03-20 DIAGNOSIS — F17210 Nicotine dependence, cigarettes, uncomplicated: Secondary | ICD-10-CM | POA: Insufficient documentation

## 2019-03-20 DIAGNOSIS — Z79899 Other long term (current) drug therapy: Secondary | ICD-10-CM | POA: Insufficient documentation

## 2019-03-20 DIAGNOSIS — F17290 Nicotine dependence, other tobacco product, uncomplicated: Secondary | ICD-10-CM | POA: Diagnosis not present

## 2019-03-20 DIAGNOSIS — G629 Polyneuropathy, unspecified: Secondary | ICD-10-CM

## 2019-03-20 DIAGNOSIS — R531 Weakness: Secondary | ICD-10-CM

## 2019-03-20 DIAGNOSIS — R0789 Other chest pain: Secondary | ICD-10-CM

## 2019-03-20 DIAGNOSIS — R52 Pain, unspecified: Secondary | ICD-10-CM

## 2019-03-20 DIAGNOSIS — R079 Chest pain, unspecified: Secondary | ICD-10-CM | POA: Diagnosis present

## 2019-03-20 DIAGNOSIS — M25512 Pain in left shoulder: Secondary | ICD-10-CM | POA: Diagnosis not present

## 2019-03-20 LAB — MAGNESIUM: Magnesium: 1.9 mg/dL (ref 1.7–2.4)

## 2019-03-20 LAB — COMPREHENSIVE METABOLIC PANEL
ALT: 13 U/L (ref 0–44)
AST: 13 U/L — ABNORMAL LOW (ref 15–41)
Albumin: 2.9 g/dL — ABNORMAL LOW (ref 3.5–5.0)
Alkaline Phosphatase: 69 U/L (ref 38–126)
Anion gap: 11 (ref 5–15)
BUN: 11 mg/dL (ref 6–20)
CO2: 22 mmol/L (ref 22–32)
Calcium: 8.9 mg/dL (ref 8.9–10.3)
Chloride: 104 mmol/L (ref 98–111)
Creatinine, Ser: 0.68 mg/dL (ref 0.44–1.00)
GFR calc Af Amer: >60 mL/min (ref >60)
GFR calc non Af Amer: >60 mL/min (ref >60)
Glucose, Bld: 100 mg/dL — ABNORMAL HIGH (ref 70–99)
Potassium: 3.8 mmol/L (ref 3.5–5.1)
Sodium: 137 mmol/L (ref 135–145)
Total Bilirubin: 0.2 mg/dL — ABNORMAL LOW (ref 0.3–1.2)
Total Protein: 8.6 g/dL — ABNORMAL HIGH (ref 6.5–8.1)

## 2019-03-20 LAB — TROPONIN I (HIGH SENSITIVITY)
Troponin I (High Sensitivity): <2 ng/L (ref <18)
Troponin I (High Sensitivity): <2 ng/L (ref <18)

## 2019-03-20 LAB — CBC WITH DIFFERENTIAL/PLATELET
Abs Immature Granulocytes: 0.05 10*3/uL (ref 0.00–0.07)
Basophils Absolute: 0.1 10*3/uL (ref 0.0–0.1)
Basophils Relative: 1 %
Eosinophils Absolute: 0.1 10*3/uL (ref 0.0–0.5)
Eosinophils Relative: 1 %
HCT: 30.7 % — ABNORMAL LOW (ref 36.0–46.0)
Hemoglobin: 9.5 g/dL — ABNORMAL LOW (ref 12.0–15.0)
Immature Granulocytes: 1 %
Lymphocytes Relative: 19 %
Lymphs Abs: 2 10*3/uL (ref 0.7–4.0)
MCH: 27 pg (ref 26.0–34.0)
MCHC: 30.9 g/dL (ref 30.0–36.0)
MCV: 87.2 fL (ref 80.0–100.0)
Monocytes Absolute: 0.7 10*3/uL (ref 0.1–1.0)
Monocytes Relative: 7 %
Neutro Abs: 7.2 10*3/uL (ref 1.7–7.7)
Neutrophils Relative %: 71 %
Platelets: 574 10*3/uL — ABNORMAL HIGH (ref 150–400)
RBC: 3.52 MIL/uL — ABNORMAL LOW (ref 3.87–5.11)
RDW: 16.1 % — ABNORMAL HIGH (ref 11.5–15.5)
WBC: 10.1 10*3/uL (ref 4.0–10.5)
nRBC: 0 % (ref 0.0–0.2)

## 2019-03-20 LAB — I-STAT BETA HCG BLOOD, ED (MC, WL, AP ONLY): I-stat hCG, quantitative: <5 m[IU]/mL (ref <5)

## 2019-03-20 LAB — PROTIME-INR
INR: 1 (ref 0.8–1.2)
Prothrombin Time: 12.9 seconds (ref 11.4–15.2)

## 2019-03-20 LAB — BRAIN NATRIURETIC PEPTIDE: B Natriuretic Peptide: 27.4 pg/mL (ref 0.0–100.0)

## 2019-03-20 MED ORDER — GABAPENTIN 300 MG PO CAPS
900.0000 mg | ORAL_CAPSULE | Freq: Once | ORAL | Status: AC
  Administered 2019-03-20: 900 mg via ORAL
  Filled 2019-03-20: qty 3

## 2019-03-20 MED ORDER — PREDNISONE 20 MG PO TABS
60.0000 mg | ORAL_TABLET | Freq: Once | ORAL | Status: AC
  Administered 2019-03-20: 60 mg via ORAL
  Filled 2019-03-20: qty 3

## 2019-03-20 MED ORDER — KETOROLAC TROMETHAMINE 15 MG/ML IJ SOLN
15.0000 mg | Freq: Once | INTRAMUSCULAR | Status: AC
  Administered 2019-03-20: 15 mg via INTRAVENOUS
  Filled 2019-03-20: qty 1

## 2019-03-20 MED ORDER — ASPIRIN 81 MG PO CHEW
324.0000 mg | CHEWABLE_TABLET | Freq: Once | ORAL | Status: DC
  Filled 2019-03-20: qty 4

## 2019-03-20 MED ORDER — PREDNISONE 20 MG PO TABS: 40.0000 mg | ORAL_TABLET | Freq: Every day | ORAL | 0 refills | Status: DC

## 2019-03-20 MED FILL — predniSONE 20 MG TABS: 20 | 4 days supply | Qty: 8 | Fill #0

## 2019-03-20 NOTE — Telephone Encounter (Signed)
Message received from Jonnie Finner, RN CM requesting a hospital follow up appointment for the patient at [facility] even though patient has already been seen at [facility].  She noted that the  patient is requesting to be seen by an MD. Informed her that an appt has been scheduled for 04/13/2019 @ 0930 @ [facility]

## 2019-03-20 NOTE — ED Triage Notes (Signed)
Pt complaint of central chest pain for a week on inspiration; hx of anxiety. Seen here 2 days ago "told I am getting pneumonia." Lung sounds clear.  Pt given 324 mg of aspirin en route with EMS.

## 2019-03-20 NOTE — ED Provider Notes (Signed)
[facility] DEPT Provider Note   CSN: [id number] Arrival date & time: 03/20/19  0830     History Chief Complaint  Patient presents with  . Chest Pain    Amanda Davenport is a 38 y.o. female.  HPI    Patient presents with her son who assists with the HPI. Patient presents due to diffuse pain, weakness, fatigue. She knowledges multiple medical issues, states that she takes all medication as directed, and since evaluation here 3 days ago has been taking antibiotics and narcotics as well. In spite of this she continues to feel diffusely weak, uncomfortable with nausea, anorexia There is pain in the same area as there was 3 days ago, left shoulder, upper chest. No new focal weakness, no syncope.  Past Medical History:  Diagnosis Date  . Allergy   . Anemia    receives transfusions periodically  . Arrhythmia   . Chronic headache   . Gout 12/2018  . Knee pain   . Nearsightedness    wears glasses  . Neuropathy   . Obesity   . Recurrent boils   . WPW (Wolff-Parkinson-White syndrome)     Patient Active Problem List   Diagnosis Date Noted  . Generalized abdominal pain 03/15/2019  . Neuropathy 12/27/2018  . Ovarian cyst 04/29/2018  . Facial cellulitis   . Otitis externa   . Dental abscess 11/27/2016  . 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 Absolute anemia 05/21/2015  . Neck strain 12/11/2014  . Hydradenitis 12/11/2014  . Dental caries 12/11/2014  . Constipation 12/03/2014  . Neck pain 12/03/2014  . Myalgia and myositis 11/27/2014  . 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 Morbid obesity ([place]) 11/27/2014  . Hematochezia 12/16/2012  . Wolff-Parkinson-White (WPW) syndrome 03/01/2012  . Iron deficiency anemia 02/16/2012  . Tachycardia 01/28/2012  . Hidradenitis suppurativa 08/18/2011  . AXILLARY ABSCESS 12/30/2006  . ANKLE PAIN 10/11/2006    Past Surgical History:  Procedure Laterality Date  . TONSILLECTOMY       OB  History    Gravida  0   Para      Term      Preterm      AB      Living        SAB      TAB      Ectopic      Multiple      Live Births              Family History  Problem Relation Age of Onset  . Breast cancer Mother   . Pulmonary embolism Mother        died of PE  . Colon cancer Mother   . Irritable bowel syndrome Mother   . Cancer Mother   . Hypertension Father   . Diabetes Paternal Grandmother   . Heart disease Neg Hx   . Stroke Neg Hx     Social History   Tobacco Use  . Smoking status: Current Some Day Smoker    Years: 0.50    Types: Cigarettes, Cigars  . Smokeless tobacco: Never Used  . Tobacco comment: smokes black and milds - last use early-mid August  Substance Use Topics  . Alcohol use: No  . Drug use: Yes    Types: Marijuana    Comment: 2+ times per month    Home Medications Prior to Admission medications   Medication Sig Start Date End Date Taking? Authorizing Provider  acetaminophen (TYLENOL) 500 MG tablet Take 500  mg by mouth every 6 (six) hours as needed for mild pain.     [provider]  allopurinol (ZYLOPRIM) 100 MG tablet Take 1 tablet (100 mg total) by mouth daily. 01/06/19   Azzie Glatter, FNP  chlorhexidine (HIBICLENS) 4 % external liquid Apply topically daily as needed. Patient taking differently: Apply 1 application topically daily as needed (cleanser).  02/14/19   [name], FNP  colchicine 0.6 MG tablet Take 1 tablet (0.6 mg total) by mouth every 8 (eight) hours as needed (for gouty attacks). 01/06/19   Azzie Glatter, FNP  doxycycline (VIBRAMYCIN) 100 MG capsule Take 1 capsule (100 mg total) by mouth 2 (two) times daily. One po bid x 7 days 03/17/19   Molpus, John, MD  FLUoxetine (PROZAC) 20 MG capsule Take 1 capsule (20 mg total) by mouth daily. 11/09/18   Lanae Boast, FNP  gabapentin (NEURONTIN) 300 MG capsule Take 2 capsules (total = 600 mg), by mouth, 3 times daily. 12/27/18   Azzie Glatter,  FNP  HYDROcodone-acetaminophen (NORCO) 5-325 MG tablet Take 1 tablet by mouth every 6 (six) hours as needed (for pain). 03/17/19   Azzie Glatter, FNP  ibuprofen (ADVIL) 600 MG tablet Take 1 tablet (600 mg total) by mouth every 8 (eight) hours as needed. Patient taking differently: Take 600 mg by mouth every 8 (eight) hours as needed for moderate pain.  02/14/19   [name], FNP  dicyclomine (BENTYL) 20 MG tablet Take 1 tablet (20 mg total) by mouth 2 (two) times daily. Patient not taking: Reported on 03/17/2019 02/23/19 03/17/19  Palumbo, April, MD    Allergies    Other, Penicillins, Shellfish-derived products, Shrimp [shellfish allergy], and Sulfa antibiotics  Review of Systems   Review of Systems  Constitutional:       Per HPI, otherwise negative  HENT:       Per HPI, otherwise negative  Respiratory:       Per HPI, otherwise negative  Cardiovascular:       Per HPI, otherwise negative  Gastrointestinal: Positive for nausea. Negative for vomiting.  Endocrine:       Negative aside from HPI  Genitourinary:       Neg aside from HPI   Musculoskeletal:       Per HPI, otherwise negative  Skin: Negative.   Neurological: Positive for weakness. Negative for syncope.    Physical Exam Updated Vital Signs BP 111/68   Pulse (!) 101   Temp 98 F (36.7 C) (Oral)   Resp 18   LMP  (LMP Unknown)   SpO2 98%   Physical Exam Vitals and nursing note reviewed.  Constitutional:      General: She is not in acute distress.    Appearance: She is obese. She is not ill-appearing, toxic-appearing or diaphoretic.     Comments: Adult female awake, alert, speaking without any apparent difficulty, oriented appropriately providing her own history of present illness.  HENT:     Head: Normocephalic and atraumatic.  Eyes:     Conjunctiva/sclera: Conjunctivae normal.  Cardiovascular:     Rate and Rhythm: Normal rate and regular rhythm.     Pulses:          Radial pulses are 2+ on the left  side.  Pulmonary:     Effort: Pulmonary effort is normal. No accessory muscle usage or respiratory distress.     Breath sounds: No stridor.  Abdominal:     General: There is no distension.  Tenderness: There is no guarding or rebound.  Musculoskeletal:     Right lower leg: No tenderness. No edema.     Left lower leg: No tenderness. No edema.  Skin:    General: Skin is warm and dry.  Neurological:     Mental Status: She is alert and oriented to person, place, and time.     Cranial Nerves: No cranial nerve deficit.  Psychiatric:        Mood and Affect: Mood is anxious.     ED Results / Procedures / Treatments   Labs (all labs ordered are listed, but only abnormal results are displayed) Labs Reviewed  COMPREHENSIVE METABOLIC PANEL - Abnormal; Notable for the following components:      Result Value   Glucose, Bld 100 (*)    Total Protein 8.6 (*)    Albumin 2.9 (*)    AST 13 (*)    Total Bilirubin 0.2 (*)    All other components within normal limits  CBC WITH DIFFERENTIAL/PLATELET - Abnormal; Notable for the following components:   RBC 3.52 (*)    Hemoglobin 9.5 (*)    HCT 30.7 (*)    RDW 16.1 (*)    Platelets 574 (*)    All other components within normal limits  MAGNESIUM  BRAIN NATRIURETIC PEPTIDE  PROTIME-INR  CBG MONITORING, ED  I-STAT BETA HCG BLOOD, ED ([facility], [facility], [facility] ONLY)  TROPONIN I (HIGH SENSITIVITY)  TROPONIN I (HIGH SENSITIVITY)    EKG EKG Interpretation  Date/Time:  Monday March 20 2019 08:47:04 EST Ventricular Rate:  99 PR Interval:    QRS Duration: 87 QT Interval:  340 QTC Calculation: 437 R Axis:   32 Text Interpretation: Sinus rhythm Artifact Otherwise within normal limits Confirmed by Carmin Muskrat [phone]) on 03/20/2019 8:56:51 AM   Radiology DG Chest Portable 1 View  Result Date: 03/20/2019 CLINICAL DATA:  Chest pain EXAM: PORTABLE CHEST 1 VIEW COMPARISON:  March 17, 2019 FINDINGS: Mild prominence to the cardiac silhouette. Both  lungs are clear. The visualized skeletal structures are unremarkable. IMPRESSION: No active disease. Electronically Signed   By: Prudencio Pair M.D.   On: 03/20/2019 09:17    Procedures Procedures (including critical care time)  Medications Ordered in ED Medications  aspirin chewable tablet 324 mg (0 mg Oral Hold 03/20/19 0930)  gabapentin (NEURONTIN) capsule 900 mg (has no administration in time range)  predniSONE (DELTASONE) tablet 60 mg (has no administration in time range)  ketorolac (TORADOL) 15 MG/ML injection 15 mg (15 mg Intravenous Given 03/20/19 1246)    ED Course  I have reviewed the triage vital signs and the nursing notes.  Pertinent labs & imaging results that were available during my care of the patient were reviewed by me and considered in my medical decision making (see chart for details).    MDM Rules/Calculators/A&P     CHA2DS2/VAS Stroke Risk Points      N/A >= 2 Points: High Risk  1 - 1.99 Points: Medium Risk  0 Points: Low Risk    A final score could not be computed because of missing components.: Last  Change: N/A     This score determines the patient's risk of having a stroke if the  patient has atrial fibrillation.      This score is not applicable to this patient. Components are not  calculated.                   Chart review notable for 4 prior  ED visits within the past 47-month period.  Patient in no distress on repeat exam  3:26 PM Patient has been ambulatory, though slowly. We had a lengthy conversation about all findings, which are all now back. Findings reassuring, 2 normal troponin, nonischemic EKG, unremarkable chest x-ray, no substantial electrolyte abnormalities per Patient confirms that she is seeking disability, is a history of weakness, which is not appreciably changed.  Patient has had difficulty with follow-up with specialists, and notes that her primary care office is largely nurse practitioners. We discussed the importance of  following up with a physician, and with her history of inflammatory disorder, she will start a short course of steroids, which she notes has been beneficial in the past. In addition, the patient is on gabapentin, but there is room to increase her daily dosing, and this will be performed as well. Given her generally reassuring findings, is no evidence for acute new life-threatening or dangerous pathology, though she does have noted baseline discomfort, and weakness. Patient's case also discussed with case management to assist with outpatient follow-up, patient discharged in stable condition.   Final Clinical Impression(s) / ED Diagnoses Final diagnoses:  Weakness  Pain  Atypical chest pain      Carmin Muskrat, MD 03/20/19 1718

## 2019-03-20 NOTE — Discharge Instructions (Addendum)
As discussed, arrangements have been made for you to follow-up with a physician for ongoing evaluation of your pain and weakness. It is important to keep that appointment.  Return here for concerning changes in your condition.  In addition to the short course of steroids you are starting, please increase your gabapentin dosing to 900 mg, 3 times daily.

## 2019-03-20 NOTE — ED Notes (Signed)
Pure wick has been placed. Suction set to 45mmHg.  

## 2019-03-20 NOTE — Progress Notes (Addendum)
Referral: No insurance, recent ED visit  TOC CM arranged follow up with PCP on 04/17/2018 at 11:20 am for ED follow up. Jonnie Finner RN CCM, [facility] ED TOC CM [phone]  Appt cancelled for Kathe Becton NP on 04/17/2018. Referral to arrange appt with MD, appt scheduled to Dr Chapman Fitch on 04/12/2018 at [place] am. Jonnie Finner RN [place], [facility] ED TOC CM [phone]

## 2019-03-28 MED FILL — GABAPENTIN 300 MG CAPSULE: 300 | 30 days supply | Qty: 180 | Fill #1

## 2019-04-13 ENCOUNTER — Other Ambulatory Visit: Payer: Self-pay

## 2019-04-13 ENCOUNTER — Encounter: Payer: Self-pay | Admitting: Family Medicine

## 2019-04-13 ENCOUNTER — Ambulatory Visit: Payer: Self-pay | Attending: Family Medicine | Admitting: Family Medicine

## 2019-04-13 VITALS — BP 91/60 | HR 101 | Temp 98.9°F | Ht 69.0 in | Wt 267.4 lb

## 2019-04-13 DIAGNOSIS — L732 Hidradenitis suppurativa: Secondary | ICD-10-CM

## 2019-04-13 DIAGNOSIS — D649 Anemia, unspecified: Secondary | ICD-10-CM

## 2019-04-13 DIAGNOSIS — M2550 Pain in unspecified joint: Secondary | ICD-10-CM

## 2019-04-13 DIAGNOSIS — G8929 Other chronic pain: Secondary | ICD-10-CM

## 2019-04-13 DIAGNOSIS — E790 Hyperuricemia without signs of inflammatory arthritis and tophaceous disease: Secondary | ICD-10-CM

## 2019-04-13 DIAGNOSIS — H6691 Otitis media, unspecified, right ear: Secondary | ICD-10-CM

## 2019-04-13 DIAGNOSIS — Z7689 Persons encountering health services in other specified circumstances: Secondary | ICD-10-CM

## 2019-04-13 DIAGNOSIS — R5383 Other fatigue: Secondary | ICD-10-CM

## 2019-04-13 DIAGNOSIS — R0602 Shortness of breath: Secondary | ICD-10-CM

## 2019-04-13 DIAGNOSIS — G629 Polyneuropathy, unspecified: Secondary | ICD-10-CM

## 2019-04-13 MED ORDER — FLUCONAZOLE 100 MG PO TABS: 100.0000 mg | ORAL_TABLET | Freq: Every day | ORAL | 1 refills | Status: AC

## 2019-04-13 MED ORDER — ACETAMINOPHEN-CODEINE #3 300-30 MG PO TABS: 1.0000 | ORAL_TABLET | Freq: Four times a day (QID) | ORAL | 0 refills | Status: DC | PRN

## 2019-04-13 MED ORDER — CLINDAMYCIN PHOSPHATE 1 % EX LOTN: TOPICAL_LOTION | Freq: Two times a day (BID) | CUTANEOUS | 0 refills | Status: DC

## 2019-04-13 MED ORDER — DOXYCYCLINE HYCLATE 100 MG PO TABS: 100.0000 mg | ORAL_TABLET | Freq: Two times a day (BID) | ORAL | 0 refills | Status: DC

## 2019-04-13 MED ORDER — DOXYCYCLINE HYCLATE 100 MG PO CAPS: 100.0000 mg | ORAL_CAPSULE | Freq: Two times a day (BID) | ORAL | 0 refills | Status: DC

## 2019-04-13 MED FILL — CLINDAMYCIN PHOSP 1% LOTION: 1 | 30 days supply | Qty: 60 | Fill #0

## 2019-04-13 MED FILL — ?DOXYCYCLINE HYCLATE 100MG: 100 | 7 days supply | Qty: 14 | Fill #0

## 2019-04-13 MED FILL — ACETAMINOPHEN/COD #3 TABLET: 300-30 | 5 days supply | Qty: 20 | Fill #0

## 2019-04-13 MED FILL — FLUCONAZOLE 100 MG TABLET: 100 | 5 days supply | Qty: 5 | Fill #0

## 2019-04-13 NOTE — Patient Instructions (Addendum)
Please use the clindamycin liquid twice daily to the areas of the skin where you tend to get recurrent infections. Antibiotic will be doxycycline which can also treat your ear infection as you cannot take amoxicillin. Doxycycline can cause nausea and diarrhea. If diarrhea develops take Immodium which you can but without a prescription. Call if you need medication for nausea.    Hidradenitis Suppurativa Hidradenitis suppurativa is a long-term (chronic) skin disease. It is similar to a severe form of acne, but it affects areas of the body where acne would be unusual, especially areas of the body where skin rubs against skin and becomes moist. These include:  Underarms.  Groin.  Genital area.  Buttocks.  Upper thighs.  Breasts. Hidradenitis suppurativa may start out as small lumps or pimples caused by blocked sweat glands or hair follicles. Pimples may develop into deep sores that break open (rupture) and drain pus. Over time, affected areas of skin may thicken and become scarred. This condition is rare and does not spread from person to person (non-contagious). What are the causes? The exact cause of this condition is not known. It may be related to:  Female and female hormones.  An overactive disease-fighting system (immune system). The immune system may over-react to blocked hair follicles or sweat glands and cause swelling and pus-filled sores. What increases the risk? You are more likely to develop this condition if you:  Are female.  Are 50-3 years old.  Have a family history of hidradenitis suppurativa.  Have a personal history of acne.  Are overweight.  Smoke.  Take the medicine lithium. What are the signs or symptoms? The first symptoms are usually painful bumps in the skin, similar to pimples. The condition may get worse over time (progress), or it may only cause mild symptoms. If the disease progresses, symptoms may include:  Skin bumps getting bigger and growing  deeper into the skin.  Bumps rupturing and draining pus.  Itchy, infected skin.  Skin getting thicker and scarred.  Tunnels under the skin (fistulas) where pus drains from a bump.  Pain during daily activities, such as pain during walking if your groin area is affected.  Emotional problems, such as stress or depression. This condition may affect your appearance and your ability or willingness to wear certain clothes or do certain activities. How is this diagnosed? This condition is diagnosed by a health care provider who specializes in skin diseases (dermatologist). You may be diagnosed based on:  Your symptoms and medical history.  A physical exam.  Testing a pus sample for infection.  Blood tests. How is this treated? Your treatment will depend on how severe your symptoms are. The same treatment will not work for everybody with this condition. You may need to try several treatments to find what works best for you. Treatment may include:  Cleaning and bandaging (dressing) your wounds as needed.  Lifestyle changes, such as new skin care routines.  Taking medicines, such as: ? Antibiotics. ? Acne medicines. ? Medicines to reduce the activity of the immune system. ? A diabetes medicine (metformin). ? Birth control pills, for women. ? Steroids to reduce swelling and pain.  Working with a mental health care provider, if you experience emotional distress due to this condition. If you have severe symptoms that do not get better with medicine, you may need surgery. Surgery may involve:  Using a laser to clear the skin and remove hair follicles.  Opening and draining deep sores.  Removing the areas of  skin that are diseased and scarred. Follow these instructions at home: Medicines   Take over-the-counter and prescription medicines only as told by your health care provider.  If you were prescribed an antibiotic medicine, take it as told by your health care provider. Do not  stop taking the antibiotic even if your condition improves. Skin care  If you have open wounds, cover them with a clean dressing as told by your health care provider. Keep wounds clean by washing them gently with soap and water when you bathe.  Do not shave the areas where you get hidradenitis suppurativa.  Do not wear deodorant.  Wear loose-fitting clothes.  Try to avoid getting overheated or sweaty. If you get sweaty or wet, change into clean, dry clothes as soon as you can.  To help relieve pain and itchiness, cover sore areas with a warm, clean washcloth (warm compress) for 5-10 minutes as often as needed.  If told by your health care provider, take a bleach bath twice a week: ? Fill your bathtub halfway with water. ? Pour in  cup of unscented household bleach. ? Soak in the tub for 5-10 minutes. ? Only soak from the neck down. Avoid water on your face and hair. ? Shower to rinse off the bleach from your skin. General instructions  Learn as much as you can about your disease so that you have an active role in your treatment. Work closely with your health care provider to find treatments that work for you.  If you are overweight, work with your health care provider to lose weight as recommended.  Do not use any products that contain nicotine or tobacco, such as cigarettes and e-cigarettes. If you need help quitting, ask your health care provider.  If you struggle with living with this condition, talk with your health care provider or work with a mental health care provider as recommended.  Keep all follow-up visits as told by your health care provider. This is important. Where to find more information  Hidradenitis [place].: [email or web address]-[email or web address]/ Contact a health care provider if you have:  A flare-up of hidradenitis suppurativa.  A fever or chills.  Trouble controlling your symptoms at home.  Trouble doing your daily activities because of  your symptoms.  Trouble dealing with emotional problems related to your condition. Summary  Hidradenitis suppurativa is a long-term (chronic) skin disease. It is similar to a severe form of acne, but it affects areas of the body where acne would be unusual.  The first symptoms are usually painful bumps in the skin, similar to pimples. The condition may get worse over time (progress), or it may only cause mild symptoms.  If you have open wounds, cover them with a clean dressing as told by your health care provider. Keep wounds clean by washing them gently with soap and water when you bathe.  Besides skin care, treatment may include medicines, laser treatment, and surgery. This information is not intended to replace advice given to you by your health care provider. Make sure you discuss any questions you have with your health care provider. Document Revised: 03/31/2017 Document Reviewed: 03/31/2017 Elsevier Patient Education  2020 [employer].

## 2019-04-13 NOTE — Progress Notes (Signed)
Pt. Is here for Hospital follow up on chest pain.   Pt. Is still having sharp back pain, and chest pain.  Pt. Is requesting to get labs done due to being exhausted.

## 2019-04-13 NOTE — Progress Notes (Signed)
Subjective:  Patient ID: Amanda Davenport, female    DOB: Jan 04, 1981  Age: 39 y.o. MRN: [id number]  CC: Hospitalization Follow-up   HPI Amanda Davenport, 39 year old African-American female, who is new to the practice but has been seen by [facility].  Patient reports that she has had 2 recent emergency department visits for left-sided chest and shoulder pain and fatigue and she states that because of her multiple medical issues, the emergency department doctor suggested that she establish care at this office.  She continues to have some pain in her left chest and shoulder area as well as continued weakness and fatigue.  She reports that she tends to get pain in multiple joints as well as occasional swelling in her hands.  At today's visit she reports that her left middle finger is painful and swollen as well as pain in her right index finger which also does not bend properly.  She reports no injuries to her hands or fingers. She also has a history of recurrent boils, usually in the armpit area.        She reports that in early December, she went to the emergency department due to concerns about possible flu or COVID-19 as she had sensation of fever, body aches, joint pain and a nonproductive cough along with nausea.  She reports at that time she was told that she might have a pneumonia after her COVID-19 test was negative.  She reports that she was placed on an antibiotic and given medication for pain.  She reports however that when she went back to the emergency department a few days later she was told that she did not have a pneumonia.  She had return to the emergency department due to continued sensation of weakness as well as nausea and loss of appetite.  She also continues to have pain in her left shoulder and upper chest which were also present at her initial ED visit for which she was diagnosed with pneumonia as the cause of her chest pain and other symptoms.  She reports that she  did receive prednisone at her last ED visit and she felt much better while on the prednisone as she states that it also helped with her chronic low back pain with radiation.  She is currently on gabapentin to help with this pain but gabapentin is not always effective.  She also reports at today's visit that she is having increased back pain with continued radiation as well as new boils related to her hydradenitis.  Boils are primarily at this time under the left armpit.  She continues to also feel very weak and fatigued.  She has also noticed within the past 3 to 4 days that she has had increased pain in the right ear that is sharp and intermittent.  She also feels as if she has had some increase in nasal congestion.  No current fever or chills.  Past Medical History:  Diagnosis Date  . Allergy   . Anemia    receives transfusions periodically  . Arrhythmia   . Chronic headache   . Gout 12/2018  . Knee pain   . Nearsightedness    wears glasses  . Neuropathy   . Obesity   . Recurrent boils   . WPW (Wolff-Parkinson-White syndrome)     Past Surgical History:  Procedure Laterality Date  . TONSILLECTOMY      Family History  Problem Relation Age of Onset  . Breast cancer Mother   .  Pulmonary embolism Mother        died of PE  . Colon cancer Mother   . Irritable bowel syndrome Mother   . Cancer Mother   . Hypertension Father   . Diabetes Paternal Grandmother   . Heart disease Neg Hx   . Stroke Neg Hx     Social History   Tobacco Use  . Smoking status: Current Some Day Smoker    Years: 0.50    Types: Cigarettes, Cigars  . Smokeless tobacco: Never Used  . Tobacco comment: smokes black and milds - last use early-mid August  Substance Use Topics  . Alcohol use: No    ROS Review of Systems  Constitutional: Positive for fatigue. Negative for chills and fever.  HENT: Positive for congestion and ear pain. Negative for sore throat and trouble swallowing.   Eyes: Negative for  photophobia and visual disturbance.  Respiratory: Positive for shortness of breath. Negative for cough.   Cardiovascular: Positive for chest pain (Pain in left upper outer chest and left shoulder). Negative for palpitations and leg swelling.  Gastrointestinal: Positive for nausea (Occasional). Negative for abdominal pain, blood in stool, constipation and diarrhea.  Endocrine: Positive for cold intolerance. Negative for heat intolerance, polydipsia, polyphagia and polyuria.  Genitourinary: Negative for dysuria and frequency.  Musculoskeletal: Positive for arthralgias, back pain, joint swelling and myalgias.  Skin: Positive for rash. Negative for wound.       Boils in the left axilla  Neurological: Positive for numbness. Negative for dizziness and headaches.  Hematological: Positive for adenopathy (Left armpit). Does not bruise/bleed easily.  Psychiatric/Behavioral: Negative for self-injury and suicidal ideas. The patient is nervous/anxious.     Objective:   Today's Vitals: BP 91/60 (BP Location: Left Arm, Patient Position: Sitting, Cuff Size: Normal)   Pulse (!) 101   Temp 98.9 F (37.2 C) (Oral)   Ht 5\' 9"  (1.753 m)   Wt 267 lb 6.4 oz (121.3 kg)   LMP 04/07/2019   SpO2 98%   BMI 39.49 kg/m   Physical Exam Vitals and nursing note reviewed.  Constitutional:      General: She is not in acute distress.    Appearance: She is obese. She is not toxic-appearing.     Comments: Well-nourished well-developed overweight for height female who appears fatigued.  She is wearing face mask as per office COVID-19 precautions  HENT:     Right Ear: Hearing, ear canal and external ear normal.     Left Ear: Hearing, ear canal and external ear normal.     Ears:     Comments: Erythema and thickening of the right TM without visible landmarks.  Left TM is light pink/dull but landmarks are visible.    Nose: Mucosal edema and congestion present.     Comments: Mild edema and erythema of the nasal  turbinates/nasal mucosa with no visible drainage or discharge Eyes:     Extraocular Movements: Extraocular movements intact.     Conjunctiva/sclera: Conjunctivae normal.  Neck:     Vascular: No carotid bruit.  Cardiovascular:     Rate and Rhythm: Normal rate and regular rhythm.  Pulmonary:     Effort: Pulmonary effort is normal.     Breath sounds: Normal breath sounds.  Abdominal:     Palpations: Abdomen is soft.     Tenderness: There is no abdominal tenderness. There is no right CVA tenderness, left CVA tenderness, guarding or rebound.     Comments: Truncal obesity  Musculoskeletal:  General: Swelling (Patient with swelling of the left middle finger which is generalized and some tenderness at the DIP joint), tenderness (Lumbosacral discomfort and positive seated left leg raise) and deformity (Patient with a deformity of the right index finger) present.     Cervical back: Normal range of motion and neck supple. No tenderness.     Right lower leg: No edema.     Left lower leg: No edema.  Lymphadenopathy:     Cervical: Cervical adenopathy (Mild right upper posterior cervical chain tender LAD) present.  Skin:    Comments: Patient with yellowish pustule/boils in the left axilla with tenderness to palpation of the left upper chest wall and borders of the axilla as well as tenderness at the area of the boils along with lymphadenopathy  Neurological:     General: No focal deficit present.     Mental Status: She is alert and oriented to person, place, and time.     Cranial Nerves: No cranial nerve deficit.  Psychiatric:     Comments: Flattened mood versus fatigue and patient appears anxious     Assessment & Plan:  1. Right otitis media, unspecified otitis media type Patient with evidence of a right otitis media on exam and on review of Up to Date, doxycycline prescribed as this can also give coverage for her current hidradenitis suppurativa of the left axilla.  2. Hidradenitis  suppurativa;9.  Encounter to establish care Refill provided of doxycycline as well as clindamycin lotion which patient is to apply twice daily and to continue on a twice daily basis even after current infection has resolved in order to help prevent future infections.  Her current infection is the likely cause of her current chest and upper shoulder discomfort.  EKG done during recent ED visit on 03/20/2019 was normal.  Patient also request pain medication and prescription provided for Tylenol 3.  Patient also requests Diflucan in case of yeast infection after use of antibiotics and due to possible current yeast infection after antibiotics and prednisone at her recent ED visit. - clindamycin (CLEOCIN T) 1 % lotion; Apply topically 2 (two) times daily.  Dispense: 60 mL; Refill: 0 - fluconazole (DIFLUCAN) 100 MG tablet; Take 1 tablet (100 mg total) by mouth daily for 5 days.  Dispense: 5 tablet; Refill: 1 - acetaminophen-codeine (TYLENOL #3) 300-30 MG tablet; Take 1 tablet by mouth every 6 (six) hours as needed for moderate pain. (Patient not taking: Reported on 04/27/2019)  Dispense: 20 tablet; Refill: 0  3. Chronic pain of multiple joints Patient with complaint of chronic pain in multiple joints as well as occasional joint swelling, especially in the hands.  She will have arthritis panel with sed rate, uric acid level and CBC to look for possible causes of her joint pain or evidence of inflammation.  Will await lab results but she will likely need referral to rheumatology for further evaluation and treatment of her multiple joint pain. - Arthritis Panel - Sedimentation Rate - CBC with Differential - Uric Acid  4. Shortness of breath Patient with complaints of shortness of breath.  She will have blood work done in follow-up of anemia which can also contribute to symptoms of shortness of breath.  She has had normal chest x-ray at her 03/20/2019 ED visit.  5. Normocytic anemia Patient reports history of  anemia and on past blood work, she has had anemia with hemoglobin of 9.5 on 03/20/2019.  She is encouraged to take a daily iron supplement but will be  notified if any other interventions are needed based on today's blood work. - CBC with Differential  6. Neuropathy Patient reports that she has had issues with neuropathy/numbness in her feet of unknown cause but also reports lumbar radiculopathy.  Patient will have T4 and TSH and vitamin B12 levels done in follow-up of her complaints of neuropathy as well as recheck of her glucose level.  During recent hospital visit on 03/20/2019, glucose was normal at 100 prior to patient being prescribed prednisone.  Patient will also be referred to neurology for further evaluation and treatment. - T4 AND TSH - Vitamin B12 - Ambulatory referral to Neurology  7. Fatigue, unspecified type Patient with complaint of ongoing issues with fatigue in addition to chronic joint pain.  Patient will have arthritis panel, CBC, thyroid blood work, vitamin B12 and vitamin D levels to look for possible additional causes of fatigue such as rheumatoid arthritis or other connective tissue disorder, worsening of anemia, thyroid disorder, B12 deficiency or vitamin D deficiency.  She will be notified of the results of the blood work and if any further treatment is needed based on these results.  She is encouraged to follow a healthy diet and to get regular cardiovascular exercise as tolerated. - Arthritis Panel - Sedimentation Rate - CBC with Differential - T4 AND TSH - Vitamin B12 - Vitamin D, 25-hydroxy  8. Hyperuricemia On review of chart, she has had past issues with elevated uric acid levels which may be contributing to her issues with chronic joint pain.  She reports that she is currently on allopurinol 100 mg daily and takes colchicine for increased joint pain and swelling. - Uric Acid  Outpatient Encounter Medications as of 04/13/2019  Medication Sig  . allopurinol  (ZYLOPRIM) 100 MG tablet Take 1 tablet (100 mg total) by mouth daily.  . colchicine 0.6 MG tablet Take 1 tablet (0.6 mg total) by mouth every 8 (eight) hours as needed (for gouty attacks).  Marland Kitchen doxycycline (VIBRAMYCIN) 100 MG capsule Take 1 capsule (100 mg total) by mouth 2 (two) times daily. One po bid x 7 days  . FLUoxetine (PROZAC) 20 MG capsule Take 1 capsule (20 mg total) by mouth daily.  Marland Kitchen gabapentin (NEURONTIN) 300 MG capsule Take 2 capsules (total = 600 mg), by mouth, 3 times daily.  Marland Kitchen HYDROcodone-acetaminophen (NORCO) 5-325 MG tablet Take 1 tablet by mouth every 6 (six) hours as needed (for pain).  . predniSONE (DELTASONE) 20 MG tablet Take 2 tablets (40 mg total) by mouth daily with breakfast. For the next four days  . acetaminophen (TYLENOL) 500 MG tablet Take 500 mg by mouth every 6 (six) hours as needed for mild pain.   . chlorhexidine (HIBICLENS) 4 % external liquid Apply topically daily as needed. (Patient not taking: Reported on 04/13/2019)  . ibuprofen (ADVIL) 600 MG tablet Take 1 tablet (600 mg total) by mouth every 8 (eight) hours as needed. (Patient not taking: Reported on 04/13/2019)  . [DISCONTINUED] dicyclomine (BENTYL) 20 MG tablet Take 1 tablet (20 mg total) by mouth 2 (two) times daily. (Patient not taking: Reported on 03/17/2019)   No facility-administered encounter medications on file as of 04/13/2019.  This note is not being shared with the patient for the following reason:  note has been marked for sharing however note cannot be closed without block phrase  An After Visit Summary was printed and given to the patient.   Follow-up: Return in about 2 weeks (around 04/27/2019) for chronic and acute issues; go  to ED if feeling worse in the meantime or return sooner.  More than 30 minutes spent on face-to-face examination and gathering of HPI details from the patient.  Additional 15 minutes spent on chart review, note completion, medication ordering and blood work orders.   Antony Blackbird MD

## 2019-04-14 LAB — CBC WITH DIFFERENTIAL/PLATELET
Basophils Absolute: 0.1 x10E3/uL (ref 0.0–0.2)
Basos: 1 %
EOS (ABSOLUTE): 0.2 x10E3/uL (ref 0.0–0.4)
Eos: 2 %
Hematocrit: 34.1 % (ref 34.0–46.6)
Hemoglobin: 10.7 g/dL — ABNORMAL LOW (ref 11.1–15.9)
Immature Grans (Abs): 0 x10E3/uL (ref 0.0–0.1)
Immature Granulocytes: 0 %
Lymphocytes Absolute: 2.8 x10E3/uL (ref 0.7–3.1)
Lymphs: 24 %
MCH: 25.8 pg — ABNORMAL LOW (ref 26.6–33.0)
MCHC: 31.4 g/dL — ABNORMAL LOW (ref 31.5–35.7)
MCV: 82 fL (ref 79–97)
Monocytes Absolute: 0.6 x10E3/uL (ref 0.1–0.9)
Monocytes: 5 %
Neutrophils Absolute: 7.8 x10E3/uL — ABNORMAL HIGH (ref 1.4–7.0)
Neutrophils: 68 %
Platelets: 557 x10E3/uL — ABNORMAL HIGH (ref 150–450)
RBC: 4.14 x10E6/uL (ref 3.77–5.28)
RDW: 15.3 % (ref 11.7–15.4)
WBC: 11.5 x10E3/uL — ABNORMAL HIGH (ref 3.4–10.8)

## 2019-04-14 LAB — VITAMIN D 25 HYDROXY (VIT D DEFICIENCY, FRACTURES): Vit D, 25-Hydroxy: 11.8 ng/mL — ABNORMAL LOW (ref 30.0–100.0)

## 2019-04-14 LAB — T4 AND TSH
T4, Total: 7.1 ug/dL (ref 4.5–12.0)
TSH: 1.36 u[IU]/mL (ref 0.450–4.500)

## 2019-04-14 LAB — ARTHRITIS PANEL
Anti Nuclear Antibody (ANA): NEGATIVE
Rheumatoid fact SerPl-aCnc: 12.7 [IU]/mL (ref 0.0–13.9)
Sed Rate: 91 mm/h — ABNORMAL HIGH (ref 0–32)
Uric Acid: 7 mg/dL — ABNORMAL HIGH (ref 2.6–6.2)

## 2019-04-14 LAB — VITAMIN B12: Vitamin B-12: 283 pg/mL (ref 232–1245)

## 2019-04-14 NOTE — Telephone Encounter (Signed)
Patient stated she will have to wait. She does not have the deposit for surgery. But will call back when she gets the deposit.

## 2019-04-14 NOTE — Telephone Encounter (Signed)
-----   Message from Azzie Glatter, [place] sent at 04/14/2019 10:05 AM EST ----- Regarding: "Surgery?" Contact patient to assess status of surgery for Chronic Hidradenitis. Please and thank you.

## 2019-04-17 ENCOUNTER — Telehealth: Payer: Self-pay | Admitting: *Deleted

## 2019-04-17 ENCOUNTER — Other Ambulatory Visit (HOSPITAL_BASED_OUTPATIENT_CLINIC_OR_DEPARTMENT_OTHER): Payer: Self-pay | Admitting: Family Medicine

## 2019-04-17 ENCOUNTER — Encounter: Payer: Self-pay | Admitting: Neurology

## 2019-04-17 DIAGNOSIS — E538 Deficiency of other specified B group vitamins: Secondary | ICD-10-CM

## 2019-04-17 DIAGNOSIS — M545 Low back pain, unspecified: Secondary | ICD-10-CM

## 2019-04-17 DIAGNOSIS — E559 Vitamin D deficiency, unspecified: Secondary | ICD-10-CM

## 2019-04-17 DIAGNOSIS — R110 Nausea: Secondary | ICD-10-CM

## 2019-04-17 MED ORDER — VITAMIN D (ERGOCALCIFEROL) 1.25 MG (50000 UNIT) PO CAPS: 50000.0000 [IU] | ORAL_CAPSULE | ORAL | 0 refills | Status: DC

## 2019-04-17 MED ORDER — ONDANSETRON HCL 4 MG PO TABS: 4.0000 mg | ORAL_TABLET | Freq: Three times a day (TID) | ORAL | 0 refills | Status: DC | PRN

## 2019-04-17 MED ORDER — PREDNISONE 20 MG PO TABS: ORAL_TABLET | ORAL | 0 refills | Status: DC

## 2019-04-17 MED ORDER — VITAMIN B-12 1000 MCG PO TABS: 1000.0000 ug | ORAL_TABLET | Freq: Every day | ORAL | 3 refills | Status: DC

## 2019-04-17 MED FILL — ?ERGOCALCIFEROL 50000 UNITC: 1.25 MG | 28 days supply | Qty: 4 | Fill #0

## 2019-04-17 NOTE — Telephone Encounter (Signed)
-----   Message from Antony Blackbird, MD sent at 04/17/2019 10:09 AM EST ----- Uric acid level is increased at 7.0 consistent with hyperuricemia, possible gout.  Sedimentation rate which is a nonspecific marker of inflammation is elevated at 91 with normal being 0-32.  Complete blood count with mild increase in white blood cell count at 11.5 which can sometimes signal presence of infection.  Anemia is improved with hemoglobin of 10.7.  Increase platelets at 557.  Normal thyroid blood work.  Vitamin B12 level at 283 is at low normal-would suggest over-the-counter vitamin B12 supplement but will also send vitamin B-12 to pharmacy due to patient's symptoms.  Vitamin D level of 11.8 which is low and prescription will be sent to patient's pharmacy for vitamin D supplement as low vitamin D can also cause fatigue and muscle aches.  Prescriptions sent to [facility] pharmacy

## 2019-04-17 NOTE — Telephone Encounter (Signed)
Notify patient that prescription has been sent to her pharmacy for prednisone taper as well as Zofran to take as needed for nausea

## 2019-04-17 NOTE — Telephone Encounter (Signed)
Patient called to get understanding of lab results. Unable to see the message by PCP attached to results.   Verified name and DOB. Informed of results and result note.   Patient request Prednisone for pain in back. She states pain is debilitating.  Patient unable to move fast enough to get to the bathroom. Has needed to use garbage can to void in. She states she is trying to hold out and not go to the ED.  Only medication that she has found to be effective is prednisone. This has been ongoing x 4 mos. She does not have relief with Tylenol, Advil, nor Tylenol #3 that has been prescribed. Pt also request medication to help with nausea while taking ATB.   Ok to send [facility] pharmacy.

## 2019-04-17 NOTE — Telephone Encounter (Signed)
Medical Assistant left message on patient's home and cell voicemail. Voicemail states to give a call back to Vaughn Beaumier with [facility] at [phone].  

## 2019-04-17 NOTE — Telephone Encounter (Signed)
Patient verified DOB Patient is aware of all results and medications awaiting her.

## 2019-04-17 NOTE — Progress Notes (Signed)
Patient ID: Amanda Davenport, female   DOB: 02/09/1981, 39 y.o.   MRN: [id number]   Phone call received from patient that she is not getting any relief from her back pain with current medications and she would like prescription sent to pharmacy for prednisone because she is having difficulty getting to the restroom in time due to her back pain and only prednisone has helped relieve her back pain.  She also requests medication for nausea with use of current antibiotic, doxycycline.  Prednisone taper and Zofran will be sent to [place].

## 2019-04-17 NOTE — Telephone Encounter (Signed)
-----   Message from Antony Blackbird, MD sent at 04/17/2019 10:09 AM EST ----- Uric acid level is increased at 7.0 consistent with hyperuricemia, possible gout.  Sedimentation rate which is a nonspecific marker of inflammation is elevated at 91 with normal being 0-32.  Complete blood count with mild increase in white blood cell count at 11.5 which can sometimes signal presence of infection.  Anemia is improved with hemoglobin of 10.7.  Increase platelets at 557.  Normal thyroid blood work.  Vitamin B12 level at 283 is at low normal-would suggest over-the-counter vitamin B12 supplement but will also send vitamin B-12 to pharmacy due to patient's symptoms.  Vitamin D level of 11.8 which is low and prescription will be sent to patient's pharmacy for vitamin D supplement as low vitamin D can also cause fatigue and muscle aches.  Prescriptions sent to [facility] pharmacy

## 2019-04-18 ENCOUNTER — Ambulatory Visit: Payer: Self-pay | Admitting: Family Medicine

## 2019-04-18 ENCOUNTER — Ambulatory Visit: Payer: Self-pay | Admitting: Gastroenterology

## 2019-04-18 MED FILL — ONDANSETRON HCL 4 MG TABLET: 4 | 6 days supply | Qty: 20 | Fill #0

## 2019-04-18 MED FILL — predniSONE 20 MG TABS: 20 | 8 days supply | Qty: 11 | Fill #0

## 2019-04-18 MED FILL — ?ALLOPURINOL 100MG TABLET: 100 | 30 days supply | Qty: 30 | Fill #2

## 2019-04-18 MED FILL — !COLCRYS 0.6 MG TABLET: 0.6 MG | 10 days supply | Qty: 30 | Fill #2

## 2019-04-26 MED FILL — GABAPENTIN 300 MG CAPSULE: 300 | 30 days supply | Qty: 180 | Fill #2

## 2019-04-27 ENCOUNTER — Other Ambulatory Visit: Payer: Self-pay

## 2019-04-27 ENCOUNTER — Encounter: Payer: Self-pay | Admitting: Family Medicine

## 2019-04-27 ENCOUNTER — Ambulatory Visit: Payer: Self-pay | Attending: Family Medicine | Admitting: Family Medicine

## 2019-04-27 VITALS — BP 106/67 | HR 99 | Temp 97.7°F | Resp 16 | Ht 69.0 in | Wt 279.0 lb

## 2019-04-27 DIAGNOSIS — M2550 Pain in unspecified joint: Secondary | ICD-10-CM

## 2019-04-27 DIAGNOSIS — G8929 Other chronic pain: Secondary | ICD-10-CM

## 2019-04-27 DIAGNOSIS — M5442 Lumbago with sciatica, left side: Secondary | ICD-10-CM

## 2019-04-27 DIAGNOSIS — E790 Hyperuricemia without signs of inflammatory arthritis and tophaceous disease: Secondary | ICD-10-CM

## 2019-04-27 DIAGNOSIS — L732 Hidradenitis suppurativa: Secondary | ICD-10-CM

## 2019-04-27 MED ORDER — DOXYCYCLINE HYCLATE 100 MG PO TABS: 100.0000 mg | ORAL_TABLET | Freq: Two times a day (BID) | ORAL | 2 refills | Status: DC

## 2019-04-27 NOTE — Progress Notes (Signed)
Established Patient Office Visit  Subjective:  Patient ID: Amanda Davenport, female    DOB: 07-09-1980  Age: 40 y.o. MRN: [id number]  CC:  Chief Complaint  Patient presents with  . Back Pain    HPI Amanda Davenport , 39 yo female, who presents in follow-up of multiple chronic issues. She reports that since taking the prednisone taper her back pain has greatly improved (patient called back after last visit to request prednisone RX) . Back pain is not currently radiating down her left leg.  She is able to walk without the use of an assistive device at this time as her back pain is improved.  Current back pain is between a 4-6 on a 0-to-10 scale.  She however continues to have issues with chronic pain in multiple joints.  Hand pain is currently improved but she does have issues with pain and swelling in her hands bilaterally.  Continued deformity of her right index finger.  She would like completion of a handicap placard for when she has increased back pain and difficulty walking.        She reports that her hidradenitis is improved however she would like a refill of doxycycline as she is starting to get new pustules in the left armpit.  She has had some improvement with use of clindamycin gel.  Denies any current fever or chills.  She does have some discomfort in the left axilla at this time.  Past Medical History:  Diagnosis Date  . Allergy   . Anemia    receives transfusions periodically  . Arrhythmia   . Chronic headache   . Gout 12/2018  . Knee pain   . Nearsightedness    wears glasses  . Neuropathy   . Obesity   . Recurrent boils   . WPW (Wolff-Parkinson-White syndrome)     Past Surgical History:  Procedure Laterality Date  . TONSILLECTOMY      Family History  Problem Relation Age of Onset  . Breast cancer Mother   . Pulmonary embolism Mother        died of PE  . Colon cancer Mother   . Irritable bowel syndrome Mother   . Cancer Mother   . Hypertension Father   .  Diabetes Paternal Grandmother   . Heart disease Neg Hx   . Stroke Neg Hx     Social History   Socioeconomic History  . Marital status: Single    Spouse name: Not on file  . Number of children: Not on file  . Years of education: Not on file  . Highest education level: Not on file  Occupational History  . Not on file  Tobacco Use  . Smoking status: Current Some Day Smoker    Years: 0.50    Types: Cigarettes, Cigars  . Smokeless tobacco: Never Used  . Tobacco comment: smokes black and milds - last use early-mid August  Substance and Sexual Activity  . Alcohol use: No  . Drug use: Yes    Types: Marijuana    Comment: 2+ times per month  . Sexual activity: Yes    Birth control/protection: None  Other Topics Concern  . Not on file  Social History Narrative  . Not on file   Social Determinants of Health   Financial Resource Strain:   . Difficulty of Paying Living Expenses: Not on file  Food Insecurity:   . Worried About Charity fundraiser in the Last Year: Not on file  .  Ran Out of Food in the Last Year: Not on file  Transportation Needs:   . Lack of Transportation (Medical): Not on file  . Lack of Transportation (Non-Medical): Not on file  Physical Activity:   . Days of Exercise per Week: Not on file  . Minutes of Exercise per Session: Not on file  Stress:   . Feeling of Stress : Not on file  Social Connections:   . Frequency of Communication with Friends and Family: Not on file  . Frequency of Social Gatherings with Friends and Family: Not on file  . Attends Religious Services: Not on file  . Active Member of Clubs or Organizations: Not on file  . Attends Archivist Meetings: Not on file  . Marital Status: Not on file  Intimate Partner Violence:   . Fear of Current or Ex-Partner: Not on file  . Emotionally Abused: Not on file  . Physically Abused: Not on file  . Sexually Abused: Not on file    Outpatient Medications Prior to Visit  Medication Sig  Dispense Refill  . allopurinol (ZYLOPRIM) 100 MG tablet Take 1 tablet (100 mg total) by mouth daily. 30 tablet 6  . clindamycin (CLEOCIN T) 1 % lotion Apply topically 2 (two) times daily. 60 mL 0  . colchicine 0.6 MG tablet Take 1 tablet (0.6 mg total) by mouth every 8 (eight) hours as needed (for gouty attacks). 30 tablet 3  . doxycycline (VIBRA-TABS) 100 MG tablet Take 1 tablet (100 mg total) by mouth 2 (two) times daily. 20 tablet 0  . doxycycline (VIBRAMYCIN) 100 MG capsule Take 1 capsule (100 mg total) by mouth 2 (two) times daily. One po bid x 7 days 14 capsule 0  . FLUoxetine (PROZAC) 20 MG capsule Take 1 capsule (20 mg total) by mouth daily. 30 capsule 3  . gabapentin (NEURONTIN) 300 MG capsule Take 2 capsules (total = 600 mg), by mouth, 3 times daily. 180 capsule 3  . HYDROcodone-acetaminophen (NORCO) 5-325 MG tablet Take 1 tablet by mouth every 6 (six) hours as needed (for pain). 20 tablet 0  . ondansetron (ZOFRAN) 4 MG tablet Take 1 tablet (4 mg total) by mouth every 8 (eight) hours as needed for nausea or vomiting. 20 tablet 0  . predniSONE (DELTASONE) 20 MG tablet Take 3 pills today then 2 pills daily x2 days, 1 pill daily x2 days then half pill daily x4 days; take after eating 11 tablet 0  . vitamin B-12 (CYANOCOBALAMIN) 1000 MCG tablet Take 1 tablet (1,000 mcg total) by mouth daily. 30 tablet 3  . Vitamin D, Ergocalciferol, (DRISDOL) 1.25 MG (50000 UNIT) CAPS capsule Take 1 capsule (50,000 Units total) by mouth every 7 (seven) days. /Once per week 20 capsule 0  . acetaminophen (TYLENOL) 500 MG tablet Take 500 mg by mouth every 6 (six) hours as needed for mild pain.     Marland Kitchen acetaminophen-codeine (TYLENOL #3) 300-30 MG tablet Take 1 tablet by mouth every 6 (six) hours as needed for moderate pain. (Patient not taking: Reported on 04/27/2019) 20 tablet 0  . chlorhexidine (HIBICLENS) 4 % external liquid Apply topically daily as needed. (Patient not taking: Reported on 04/13/2019) 120 mL 0  .  ibuprofen (ADVIL) 600 MG tablet Take 1 tablet (600 mg total) by mouth every 8 (eight) hours as needed. (Patient not taking: Reported on 04/27/2019) 30 tablet 0   No facility-administered medications prior to visit.    Allergies  Allergen Reactions  . Other Other (See  Comments)    All Antibiotics cause severe vaginal yeast infections  . Penicillins Hives, Itching and Swelling    Has patient had a PCN reaction causing immediate rash, facial/tongue/throat swelling, SOB or lightheadedness with hypotension: Yes Has patient had a PCN reaction causing severe rash involving mucus membranes or skin necrosis: Yes Has patient had a PCN reaction that required hospitalization Yes Has patient had a PCN reaction occurring within the last 10 years: Yes If all of the above answers are "NO", then may proceed with Cephalosporin use.   . Shellfish-Derived Products Hives  . Shrimp [Shellfish Allergy] Hives  . Sulfa Antibiotics Rash    ROS Review of Systems  Constitutional: Positive for fatigue. Negative for chills and fever.  Eyes: Negative for photophobia and visual disturbance.  Respiratory: Negative for cough and shortness of breath.   Cardiovascular: Negative for chest pain and palpitations.  Gastrointestinal: Negative for abdominal pain, constipation, diarrhea and nausea.  Endocrine: Negative for polydipsia, polyphagia and polyuria.  Genitourinary: Negative for dysuria and frequency.  Musculoskeletal: Positive for arthralgias, back pain and joint swelling.  Neurological: Negative for dizziness and headaches.  Hematological: Positive for adenopathy (Left armpit where she currently has infection). Does not bruise/bleed easily.  Psychiatric/Behavioral: Negative for self-injury and suicidal ideas. The patient is nervous/anxious (Controlled with medication).       Objective:    Physical Exam  Constitutional: She is oriented to person, place, and time. She appears well-developed and well-nourished.    Well-nourished well-developed obese female in no acute distress. Patient is ambulating without the use of an assistive device. .  Patient is wearing mask as per office COVID-19 precautions.  Cardiovascular: Normal rate.  Pulmonary/Chest: Effort normal and breath sounds normal.  Abdominal: Soft. There is no abdominal tenderness. There is no rebound and no guarding.  Musculoskeletal:        General: Tenderness (Mild lumbosacral discomfort to palpation) and deformity present.     Cervical back: Normal range of motion and neck supple.     Comments: Patient with deformity of the right index finger and patient with some mild puffiness of the hands over the M CP joints but no reproducible pain at today's visit.  She does also have some mild nodularity at the DIP joints  Lymphadenopathy:    She has no cervical adenopathy.  Neurological: She is alert and oriented to person, place, and time.  Skin: Skin is warm and dry.  Patient with small pustules in the left axilla, some of which appear to have previously drained.  Area is slightly tender to touch.  Psychiatric: She has a normal mood and affect. Her behavior is normal.  Nursing note and vitals reviewed.   BP 106/67 (BP Location: Left Arm)   Pulse 99   Temp 97.7 F (36.5 C)   Resp 16   Ht _0  (1.753 m)   Wt 279 lb (126.6 kg)   LMP 04/07/2019   SpO2 99%   BMI 41.20 kg/m  Wt Readings from Last 3 Encounters:  04/27/19 279 lb (126.6 kg)  04/13/19 267 lb 6.4 oz (121.3 kg)  03/16/19 270 lb (122.5 kg)     Lab Results  Component Value Date   TSH 1.360 04/13/2019   Lab Results  Component Value Date   WBC 11.5 (H) 04/13/2019   HGB 10.7 (L) 04/13/2019   HCT 34.1 04/13/2019   MCV 82 04/13/2019   PLT 557 (H) 04/13/2019   Lab Results  Component Value Date   NA 137  03/20/2019   K 3.8 03/20/2019   CHLORIDE 109 12/18/2014   CO2 22 03/20/2019   GLUCOSE 100 (H) 03/20/2019   BUN 11 03/20/2019   CREATININE 0.68 03/20/2019   BILITOT 0.2  (L) 03/20/2019   ALKPHOS 69 03/20/2019   AST 13 (L) 03/20/2019   ALT 13 03/20/2019   PROT 8.6 (H) 03/20/2019   ALBUMIN 2.9 (L) 03/20/2019   CALCIUM 8.9 03/20/2019   ANIONGAP 11 03/20/2019   EGFR >90 12/18/2014   No results found for: CHOL No results found for: HDL No results found for: LDLCALC No results found for: TRIG No results found for: [facility] Lab Results  Component Value Date   HGBA1C 5.1 03/14/2019      Assessment & Plan:  1. Hidradenitis suppurativa Refill provided of doxycycline for flareup of hidradenitis.  Continue daily use of clindamycin gel to help prevent new outbreaks.  Also use antibacterial soap.  Discussed possibility of addition of Metformin as this has been shown to reduce hidradenitis outbreaks in some studies. - doxycycline (VIBRA-TABS) 100 MG tablet; Take 1 tablet (100 mg total) by mouth 2 (two) times daily. For skin infection  Dispense: 20 tablet; Refill: 2  2. Chronic pain of multiple joints 3. Hyperuricemia She is currently on allopurinol and colchicine for hyperuricemia and patient with recent blood work with increase in sed rate at 91 and uric acid level of 7.0.  She was also notified of low vitamin D level of 11.8 on blood work from her last visit and was encouraged to start prescription vitamin D supplement as vitamin D deficiency can contribute to joint pain/fatigue.  She would also benefit from rheumatology referral.  She is to complete application for [place] financial assistance program.  4. Chronic left-sided low back pain with left-sided sciatica She reports improvement in back pain at today's visit.   An After Visit Summary was printed and given to the patient.   Follow-up: Return in about 2 months (around 06/25/2019) for chronic issues- sooner if needed.    Antony Blackbird, MD

## 2019-04-27 NOTE — Progress Notes (Signed)
Here or f /u  C /o recurrent  Back and shoulder pain on and off X 2 months. Pain relieved with prescribed meds

## 2019-04-28 MED FILL — DOXYCYCLINE HYCLATE 100 MG: 100 | 10 days supply | Qty: 20 | Fill #0

## 2019-05-15 ENCOUNTER — Encounter: Payer: Self-pay | Admitting: Family Medicine

## 2019-05-15 MED FILL — ?ERGOCALCIFEROL 50000 UNITC: 1.25 MG | 28 days supply | Qty: 4 | Fill #1

## 2019-05-19 ENCOUNTER — Ambulatory Visit: Payer: Self-pay | Admitting: Neurology

## 2019-05-30 ENCOUNTER — Telehealth: Payer: Self-pay | Admitting: Family Medicine

## 2019-05-30 NOTE — Telephone Encounter (Signed)
1) Medication(s) Requested (by name): -acetaminophen-codeine (TYLENOL #3) 300-30 MG tablet  -predniSONE (DELTASONE) 20 MG tablet   2) Pharmacy of Choice: -[place] [place]

## 2019-05-30 NOTE — Telephone Encounter (Signed)
Please see if there is an appointment with another provider for patient to be seen acutely regarding her request for Tylenol 3 and prednisone which I am guessing she is requesting for her chronic issues with back pain

## 2019-06-01 ENCOUNTER — Emergency Department (HOSPITAL_COMMUNITY)
Admission: EM | Admit: 2019-06-01 | Discharge: 2019-06-01 | Disposition: A | Discharge status: Home / Self Care | Payer: Medicare Other | Attending: Emergency Medicine | Admitting: Emergency Medicine

## 2019-06-01 ENCOUNTER — Other Ambulatory Visit: Payer: Self-pay

## 2019-06-01 ENCOUNTER — Ambulatory Visit: Payer: Medicare Other

## 2019-06-01 ENCOUNTER — Encounter (HOSPITAL_COMMUNITY): Payer: Self-pay | Admitting: Emergency Medicine

## 2019-06-01 ENCOUNTER — Emergency Department (HOSPITAL_COMMUNITY): Payer: Medicare Other

## 2019-06-01 DIAGNOSIS — R072 Precordial pain: Secondary | ICD-10-CM | POA: Diagnosis not present

## 2019-06-01 DIAGNOSIS — R064 Hyperventilation: Secondary | ICD-10-CM | POA: Diagnosis not present

## 2019-06-01 DIAGNOSIS — F17210 Nicotine dependence, cigarettes, uncomplicated: Secondary | ICD-10-CM | POA: Insufficient documentation

## 2019-06-01 DIAGNOSIS — R0789 Other chest pain: Secondary | ICD-10-CM | POA: Diagnosis present

## 2019-06-01 DIAGNOSIS — Z79899 Other long term (current) drug therapy: Secondary | ICD-10-CM | POA: Insufficient documentation

## 2019-06-01 DIAGNOSIS — L732 Hidradenitis suppurativa: Secondary | ICD-10-CM | POA: Insufficient documentation

## 2019-06-01 LAB — CBC
HCT: 33.8 % — ABNORMAL LOW (ref 36.0–46.0)
Hemoglobin: 10.4 g/dL — ABNORMAL LOW (ref 12.0–15.0)
MCH: 25.1 pg — ABNORMAL LOW (ref 26.0–34.0)
MCHC: 30.8 g/dL (ref 30.0–36.0)
MCV: 81.4 fL (ref 80.0–100.0)
Platelets: 532 10*3/uL — ABNORMAL HIGH (ref 150–400)
RBC: 4.15 MIL/uL (ref 3.87–5.11)
RDW: 17.3 % — ABNORMAL HIGH (ref 11.5–15.5)
WBC: 10.8 10*3/uL — ABNORMAL HIGH (ref 4.0–10.5)
nRBC: 0 % (ref 0.0–0.2)

## 2019-06-01 LAB — PROTIME-INR
INR: 0.9 (ref 0.8–1.2)
Prothrombin Time: 12.4 seconds (ref 11.4–15.2)

## 2019-06-01 LAB — BASIC METABOLIC PANEL
Anion gap: 12 (ref 5–15)
BUN: 10 mg/dL (ref 6–20)
CO2: 23 mmol/L (ref 22–32)
Calcium: 8.7 mg/dL — ABNORMAL LOW (ref 8.9–10.3)
Chloride: 103 mmol/L (ref 98–111)
Creatinine, Ser: 0.74 mg/dL (ref 0.44–1.00)
GFR calc Af Amer: >60 mL/min (ref >60)
GFR calc non Af Amer: >60 mL/min (ref >60)
Glucose, Bld: 119 mg/dL — ABNORMAL HIGH (ref 70–99)
Potassium: 3.6 mmol/L (ref 3.5–5.1)
Sodium: 138 mmol/L (ref 135–145)

## 2019-06-01 LAB — I-STAT BETA HCG BLOOD, ED (MC, WL, AP ONLY): I-stat hCG, quantitative: <5 m[IU]/mL (ref <5)

## 2019-06-01 LAB — TROPONIN I (HIGH SENSITIVITY): Troponin I (High Sensitivity): <2 ng/L (ref <18)

## 2019-06-01 MED ORDER — HYDROMORPHONE HCL 1 MG/ML IJ SOLN
1.0000 mg | Freq: Once | INTRAMUSCULAR | Status: AC
  Administered 2019-06-01: 1 mg via INTRAMUSCULAR
  Filled 2019-06-01: qty 1

## 2019-06-01 MED ORDER — SODIUM CHLORIDE 0.9% FLUSH: 3.0000 mL | Freq: Once | INTRAVENOUS | Status: DC

## 2019-06-01 MED ORDER — IBUPROFEN 400 MG PO TABS
400.0000 mg | ORAL_TABLET | Freq: Once | ORAL | Status: AC
  Administered 2019-06-01: 400 mg via ORAL
  Filled 2019-06-01: qty 1

## 2019-06-01 MED ORDER — PREDNISONE 50 MG PO TABS: ORAL_TABLET | ORAL | 0 refills | Status: DC

## 2019-06-01 MED FILL — ?PREDINSONE 10MG TABLETS: 10 | 5 days supply | Qty: 25 | Fill #0

## 2019-06-01 NOTE — ED Notes (Signed)
The pt is c/o  Mid-chest  And upper abd pain for 2 days  She thinks it been caused by her skin condition no distress

## 2019-06-01 NOTE — ED Notes (Signed)
The pt continues to hyperventilate

## 2019-06-01 NOTE — ED Provider Notes (Signed)
[facility] EMERGENCY DEPARTMENT Provider Note   CSN: [id number] Arrival date & time: 06/01/19  [id number]     History Chief Complaint  Patient presents with  . Chest Pain    Amanda Davenport is a 39 y.o. female.  The history is provided by the patient.  Chest Pain Pain location:  L chest Pain quality: sharp   Pain radiates to:  Upper back and L arm Pain severity:  Moderate Onset quality:  Gradual Duration:  2 days Timing:  Intermittent Progression:  Unchanged Chronicity:  New Relieved by:  None tried Worsened by:  Deep breathing and coughing Associated symptoms: nausea and shortness of breath   Associated symptoms: no cough, no fever, no syncope and no vomiting   Patient with history of WPW, hidradenitis presents with chest pain.  She reports over the past 2 days she has had left-sided chest pain that goes into her back and left arm.  She reports it is worse with coughing and and breathing.  No fevers.  She also reports worsening of her hidradenitis in her left axilla. She has known history of WPW but has declined ablation No history of CAD/VTE     Past Medical History:  Diagnosis Date  . Allergy   . Anemia    receives transfusions periodically  . Arrhythmia   . Chronic headache   . Gout 12/2018  . Knee pain   . Nearsightedness    wears glasses  . Neuropathy   . Obesity   . Recurrent boils   . WPW (Wolff-Parkinson-White syndrome)     Patient Active Problem List   Diagnosis Date Noted  . Generalized abdominal pain 03/15/2019  . Neuropathy 12/27/2018  . Ovarian cyst 04/29/2018  . Facial cellulitis   . Otitis externa   . Dental abscess 11/27/2016  . 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 Absolute anemia 05/21/2015  . Neck strain 12/11/2014  . Hydradenitis 12/11/2014  . Dental caries 12/11/2014  . Constipation 12/03/2014  . Neck pain 12/03/2014  . Myalgia and myositis 11/27/2014  . 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 Morbid  obesity ([place]) 11/27/2014  . Hematochezia 12/16/2012  . Wolff-Parkinson-White (WPW) syndrome 03/01/2012  . Iron deficiency anemia 02/16/2012  . Tachycardia 01/28/2012  . Hidradenitis suppurativa 08/18/2011  . AXILLARY ABSCESS 12/30/2006  . ANKLE PAIN 10/11/2006    Past Surgical History:  Procedure Laterality Date  . TONSILLECTOMY       OB History    Gravida  0   Para      Term      Preterm      AB      Living        SAB      TAB      Ectopic      Multiple      Live Births              Family History  Problem Relation Age of Onset  . Breast cancer Mother   . Pulmonary embolism Mother        died of PE  . Colon cancer Mother   . Irritable bowel syndrome Mother   . Cancer Mother   . Hypertension Father   . Diabetes Paternal Grandmother   . Heart disease Neg Hx   . Stroke Neg Hx     Social History   Tobacco Use  . Smoking status: Current Some Day Smoker    Years: 0.50    Types: Cigarettes, Cigars  .  Smokeless tobacco: Never Used  . Tobacco comment: smokes black and milds - last use early-mid August  Substance Use Topics  . Alcohol use: No  . Drug use: Yes    Types: Marijuana    Comment: 2+ times per month    Home Medications Prior to Admission medications   Medication Sig Start Date End Date Taking? Authorizing Provider  acetaminophen (TYLENOL) 650 MG CR tablet Take 1,300 mg by mouth every 8 (eight) hours as needed for pain.   Yes [provider]  allopurinol (ZYLOPRIM) 100 MG tablet Take 1 tablet (100 mg total) by mouth daily. 01/06/19  Yes Azzie Glatter, FNP  colchicine 0.6 MG tablet Take 1 tablet (0.6 mg total) by mouth every 8 (eight) hours as needed (for gouty attacks). 01/06/19  Yes Azzie Glatter, FNP  gabapentin (NEURONTIN) 300 MG capsule Take 2 capsules (total = 600 mg), by mouth, 3 times daily. Patient taking differently: Take 1,200 mg by mouth 3 (three) times daily. . 12/27/18  Yes Azzie Glatter, FNP  ibuprofen  (ADVIL) 800 MG tablet Take 800 mg by mouth every 8 (eight) hours as needed for moderate pain.   Yes [provider]  magnesium oxide (MAG-OX) 400 MG tablet Take 400 mg by mouth daily.   Yes [provider]  ondansetron (ZOFRAN) 4 MG tablet Take 1 tablet (4 mg total) by mouth every 8 (eight) hours as needed for nausea or vomiting. 04/17/19  Yes Fulp, Cammie, MD  vitamin B-12 (CYANOCOBALAMIN) 1000 MCG tablet Take 1 tablet (1,000 mcg total) by mouth daily. 04/17/19  Yes Fulp, Cammie, MD  acetaminophen (TYLENOL) 500 MG tablet Take 500 mg by mouth every 6 (six) hours as needed for mild pain.     [provider]  doxycycline (VIBRA-TABS) 100 MG tablet Take 1 tablet (100 mg total) by mouth 2 (two) times daily. For skin infection Patient not taking: Reported on 06/01/2019 04/27/19   Fulp, Ander Gaster, MD  Vitamin D, Ergocalciferol, (DRISDOL) 1.25 MG (50000 UNIT) CAPS capsule Take 1 capsule (50,000 Units total) by mouth every 7 (seven) days. Reatha Armour per week 04/17/19   Fulp, Cammie, MD  dicyclomine (BENTYL) 20 MG tablet Take 1 tablet (20 mg total) by mouth 2 (two) times daily. Patient not taking: Reported on 03/17/2019 02/23/19 03/17/19  Palumbo, April, MD    Allergies    Other, Penicillins, Shellfish-derived products, Shrimp [shellfish allergy], and Sulfa antibiotics  Review of Systems   Review of Systems  Constitutional: Negative for fever.  Respiratory: Positive for shortness of breath. Negative for cough.   Cardiovascular: Positive for chest pain. Negative for syncope.  Gastrointestinal: Positive for nausea. Negative for vomiting.  Neurological: Negative for syncope.  All other systems reviewed and are negative.   Physical Exam Updated Vital Signs BP 110/80   Pulse 88   Temp 98.5 F (36.9 C) (Oral)   Resp (!) 34   Ht 1.753 m (5\' 9" )   Wt 125 kg   LMP 05/28/2019   SpO2 95%   BMI 40.70 kg/m   Physical Exam CONSTITUTIONAL: Well developed/well nourished HEAD:  Normocephalic/atraumatic EYES: EOMI/PERRL ENMT: Mucous membranes moist NECK: supple no meningeal signs SPINE/BACK:entire spine nontender CV: S1/S2 noted, no murmurs/rubs/gallops noted LUNGS: Lungs are clear to auscultation bilaterally, no apparent distress Chest-diffuse left-sided chest wall tenderness to palpation, no crepitus ABDOMEN: soft, nontender, no rebound or guarding, bowel sounds noted throughout abdomen GU:no cva tenderness NEURO: Pt is awake/alert/appropriate, moves all extremitiesx4.  No facial droop.  EXTREMITIES: pulses normal/equal, full ROM SKIN: warm, color normal, hidradenitis suppurativa to left axilla, no active drainage PSYCH: Anxious ED Results / Procedures / Treatments   Labs (all labs ordered are listed, but only abnormal results are displayed) Labs Reviewed  BASIC METABOLIC PANEL - Abnormal; Notable for the following components:      Result Value   Glucose, Bld 119 (*)    Calcium 8.7 (*)    All other components within normal limits  CBC - Abnormal; Notable for the following components:   WBC 10.8 (*)    Hemoglobin 10.4 (*)    HCT 33.8 (*)    MCH 25.1 (*)    RDW 17.3 (*)    Platelets 532 (*)    All other components within normal limits  PROTIME-INR  I-STAT BETA HCG BLOOD, ED ([facility], [facility], [facility] ONLY)  TROPONIN I (HIGH SENSITIVITY)    EKG  ED ECG REPORT   Date: 06/01/2019 0235  Rate: 108  Rhythm: sinus tachycardia  QRS Axis: normal  Intervals: normal  ST/T Wave abnormalities: nonspecific T wave changes  Conduction Disutrbances:WPW  Narrative Interpretation:     I have personally reviewed the EKG tracing and agree with the computerized printout as noted.  Radiology DG Chest 2 View  Result Date: 06/01/2019 CLINICAL DATA:  Chest pain, draining left chest wall hidradenitis EXAM: CHEST - 2 VIEW COMPARISON:  Radiograph 03/20/2019, CT 05/10/2018 FINDINGS: No consolidation, features of edema, pneumothorax, or effusion. Pulmonary vascularity is normally  distributed. The cardiomediastinal contours are unremarkable. No acute osseous or soft tissue abnormality. No visible soft tissue gas foreign body or other acute soft tissue abnormality. IMPRESSION: Normal chest radiograph. Electronically Signed   By: Lovena Le M.D.   On: 06/01/2019 02:59    Procedures Procedures  Medications Ordered in ED Medications  sodium chloride flush (NS) 0.9 % injection 3 mL (has no administration in time range)  HYDROmorphone (DILAUDID) injection 1 mg (1 mg Intramuscular Given 06/01/19 0415)  ibuprofen (ADVIL) tablet 400 mg (400 mg Oral Given 06/01/19 0413)    ED Course  I have reviewed the triage vital signs and the nursing notes.  Pertinent labs & imaging results that were available during my care of the patient were reviewed by me and considered in my medical decision making (see chart for details).    MDM Rules/Calculators/A&P                      Patient presented with chest pain that was reproducible.  After pain medications were given she felt improved. X-ray was negative. All labs near baseline.  She has a history of WPW but she has never had ablation, but no recent syncope  I have low suspicion for ACS/PE at this time.  Patient insistent this is like previous episodes of joint pain that respond to prednisone.  Per PCP notes she is has chronic pain in multiple joints and hyperuricemia. However due to her hidradenitis, I am concerned about starting prednisone as it may flareup an infection.  She has been on antibiotics recently for her hidradenitis.  She reports she is following up later today with a surgeon for evaluation of her hidradenitis Patient says that the prednisone will help her pain. I advised her to not take the prednisone until she is seen by the surgeon later today. Patient agreeable with this plan Final Clinical Impression(s) / ED Diagnoses Final diagnoses:  Precordial pain  Hidradenitis suppurativa    Rx / DC Orders ED  Discharge Orders         Ordered    predniSONE (DELTASONE) 50 MG tablet     06/01/19 0524           Ripley Fraise, MD 06/01/19 0600

## 2019-06-01 NOTE — Discharge Instructions (Addendum)
Do not take the steroids until you see your plastic surgeon today   Your caregiver has diagnosed you as having chest pain that is not specific for one problem, but does not require admission.  Chest pain comes from many different causes.  SEEK IMMEDIATE MEDICAL ATTENTION IF: You have severe chest pain, especially if the pain is crushing or pressure-like and spreads to the arms, back, neck, or jaw, or if you have sweating, nausea (feeling sick to your stomach), or shortness of breath. THIS IS AN EMERGENCY. Don't wait to see if the pain will go away. Get medical help at once. Call 911 or 0 (operator). DO NOT drive yourself to the hospital.  Your chest pain gets worse and does not go away with rest.  You have an attack of chest pain lasting longer than usual, despite rest and treatment with the medications your caregiver has prescribed.  You wake from sleep with chest pain or shortness of breath.  You feel dizzy or faint.  You have chest pain not typical of your usual pain for which you originally saw your caregiver.

## 2019-06-01 NOTE — ED Triage Notes (Signed)
Patient reports left upper chest pain radiating to left upper back with SOB and nausea onset yesterday , no emesis or diaphoresis , denies cough or fever . Patient added draining left chest hidradenitis  this week .

## 2019-06-06 ENCOUNTER — Ambulatory Visit: Payer: Self-pay | Admitting: Surgery

## 2019-06-12 ENCOUNTER — Ambulatory Visit: Payer: Self-pay | Admitting: Family Medicine

## 2019-06-28 ENCOUNTER — Ambulatory Visit: Payer: Medicare Other | Attending: Family Medicine | Admitting: Family Medicine

## 2019-06-28 ENCOUNTER — Other Ambulatory Visit: Payer: Self-pay

## 2019-06-28 ENCOUNTER — Encounter: Payer: Self-pay | Admitting: Family Medicine

## 2019-06-28 VITALS — BP 101/68 | HR 118 | Ht 69.0 in | Wt 283.6 lb

## 2019-06-28 DIAGNOSIS — D649 Anemia, unspecified: Secondary | ICD-10-CM | POA: Diagnosis not present

## 2019-06-28 DIAGNOSIS — R000 Tachycardia, unspecified: Secondary | ICD-10-CM | POA: Diagnosis not present

## 2019-06-28 DIAGNOSIS — Z87891 Personal history of nicotine dependence: Secondary | ICD-10-CM | POA: Insufficient documentation

## 2019-06-28 DIAGNOSIS — R079 Chest pain, unspecified: Secondary | ICD-10-CM | POA: Insufficient documentation

## 2019-06-28 DIAGNOSIS — E669 Obesity, unspecified: Secondary | ICD-10-CM | POA: Insufficient documentation

## 2019-06-28 DIAGNOSIS — M109 Gout, unspecified: Secondary | ICD-10-CM | POA: Insufficient documentation

## 2019-06-28 DIAGNOSIS — R739 Hyperglycemia, unspecified: Secondary | ICD-10-CM | POA: Insufficient documentation

## 2019-06-28 DIAGNOSIS — Z6841 Body Mass Index (BMI) 40.0 and over, adult: Secondary | ICD-10-CM | POA: Insufficient documentation

## 2019-06-28 DIAGNOSIS — L732 Hidradenitis suppurativa: Secondary | ICD-10-CM

## 2019-06-28 DIAGNOSIS — Z882 Allergy status to sulfonamides status: Secondary | ICD-10-CM | POA: Insufficient documentation

## 2019-06-28 DIAGNOSIS — Z881 Allergy status to other antibiotic agents status: Secondary | ICD-10-CM | POA: Insufficient documentation

## 2019-06-28 DIAGNOSIS — N611 Abscess of the breast and nipple: Secondary | ICD-10-CM | POA: Diagnosis not present

## 2019-06-28 DIAGNOSIS — R110 Nausea: Secondary | ICD-10-CM

## 2019-06-28 DIAGNOSIS — R7309 Other abnormal glucose: Secondary | ICD-10-CM

## 2019-06-28 DIAGNOSIS — Z880 Allergy status to penicillin: Secondary | ICD-10-CM | POA: Insufficient documentation

## 2019-06-28 DIAGNOSIS — Z79899 Other long term (current) drug therapy: Secondary | ICD-10-CM | POA: Insufficient documentation

## 2019-06-28 DIAGNOSIS — Z09 Encounter for follow-up examination after completed treatment for conditions other than malignant neoplasm: Secondary | ICD-10-CM

## 2019-06-28 MED ORDER — DOXYCYCLINE HYCLATE 100 MG PO TABS: 100.0000 mg | ORAL_TABLET | Freq: Two times a day (BID) | ORAL | 2 refills | Status: DC

## 2019-06-28 MED ORDER — ONDANSETRON HCL 4 MG PO TABS: 4.0000 mg | ORAL_TABLET | Freq: Three times a day (TID) | ORAL | 0 refills | Status: DC | PRN

## 2019-06-28 MED ORDER — ACETAMINOPHEN-CODEINE #3 300-30 MG PO TABS: 1.0000 | ORAL_TABLET | Freq: Four times a day (QID) | ORAL | 0 refills | Status: AC | PRN

## 2019-06-28 MED ORDER — IBUPROFEN 800 MG PO TABS: 800.0000 mg | ORAL_TABLET | Freq: Three times a day (TID) | ORAL | 0 refills | Status: DC | PRN

## 2019-06-28 MED ORDER — MUPIROCIN 2 % EX OINT: TOPICAL_OINTMENT | CUTANEOUS | 3 refills | Status: DC

## 2019-06-28 MED FILL — IBUPROFEN 800 MG TABLET: 800 | 20 days supply | Qty: 60 | Fill #0

## 2019-06-28 MED FILL — ACETAMINOPHEN/COD #3 TABLET: 300-30 | 7 days supply | Qty: 30 | Fill #0

## 2019-06-28 MED FILL — ONDANSETRON HCL 4 MG TABLET: 4 | 6 days supply | Qty: 20 | Fill #0

## 2019-06-28 MED FILL — MUPIROCIN 2% OINTMENT: 2 | 10 days supply | Qty: 22 | Fill #0

## 2019-06-28 MED FILL — DOXYCYCLINE HYCLATE 100 MG: 100 | 10 days supply | Qty: 20 | Fill #0

## 2019-06-28 NOTE — Progress Notes (Signed)
2 mo f/u for pt Hidradenitis   Med refills for Tylenol 3, Ibuprofen, Zofran

## 2019-06-28 NOTE — Progress Notes (Signed)
Established Patient Office Visit  Subjective:  Patient ID: Amanda Davenport, female    DOB: [birth year]  Age: 39 y.o. MRN: [id number]  CC: No chief complaint on file.   HPI [name] , 39 year old female with history of recurrent hydradenitis who returns for follow-up as she states that she has now had increased infection in the left axilla and right breast.  She would like to have refill of antibiotic, Tylenol 3, ibuprofen and patient also would like Bactroban ointment which her hidradenitis support group has recommended.  She is also status post emergency department visit 06/01/2019 and she states that she was seen for back pain.  She reports that after being prescribed prednisone in the emergency department, her back pain has resolved.  She reports that she is now taking creatinine which she read about online to help with chronic back/joint pain.  She denies any current fever or chills.  She states that she is having pain in the left upper chest which she feels is related to the hidradenitis in her left armpit as she states that the pain is worse when she moves her arm.  Current pain is between an 8 and a 10 or greater on a 0-to-10 scale with 10 being the worst imaginable pain.  Pain is sharp and sometimes throbbing.  She would also like refill of Zofran for nausea associated with her pain.  She denies any increased thirst, no urinary frequency or dysuria.  No sensation of palpitations.  No unusual bruising or bleeding.  Past Medical History:  Diagnosis Date  . Allergy   . Anemia    receives transfusions periodically  . Arrhythmia   . Chronic headache   . Gout 12/2018  . Knee pain   . Nearsightedness    wears glasses  . Neuropathy   . Obesity   . Recurrent boils   . WPW (Wolff-Parkinson-White syndrome)     Past Surgical History:  Procedure Laterality Date  . TONSILLECTOMY      Family History  Problem Relation Age of Onset  . Breast cancer Mother   . Pulmonary embolism  Mother        died of PE  . Colon cancer Mother   . Irritable bowel syndrome Mother   . Cancer Mother   . Hypertension Father   . Diabetes Paternal Grandmother   . Heart disease Neg Hx   . Stroke Neg Hx     Social History   Socioeconomic History  . Marital status: Single    Spouse name: Not on file  . Number of children: Not on file  . Years of education: Not on file  . Highest education level: Not on file  Occupational History  . Not on file  Tobacco Use  . Smoking status: Former Smoker    Years: 0.50    Types: Cigarettes, Cigars    Quit date: 04/30/2019    Years since quitting: 0.1  . Smokeless tobacco: Never Used  . Tobacco comment: smokes black and milds - last use early-mid August  Substance and Sexual Activity  . Alcohol use: No  . Drug use: Yes    Types: Marijuana    Comment: 2+ times per month  . Sexual activity: Yes    Birth control/protection: None  Other Topics Concern  . Not on file  Social History Narrative  . Not on file   Social Determinants of Health   Financial Resource Strain:   . Difficulty of Paying Living Expenses:  Food Insecurity:   . Worried About Charity fundraiser in the Last Year:   . Arboriculturist in the Last Year:   Transportation Needs:   . Film/video editor (Medical):   Marland Kitchen Lack of Transportation (Non-Medical):   Physical Activity:   . Days of Exercise per Week:   . Minutes of Exercise per Session:   Stress:   . Feeling of Stress :   Social Connections:   . Frequency of Communication with Friends and Family:   . Frequency of Social Gatherings with Friends and Family:   . Attends Religious Services:   . Active Member of Clubs or Organizations:   . Attends Archivist Meetings:   Marland Kitchen Marital Status:   Intimate Partner Violence:   . Fear of Current or Ex-Partner:   . Emotionally Abused:   Marland Kitchen Physically Abused:   . Sexually Abused:     Outpatient Medications Prior to Visit  Medication Sig Dispense Refill  .  acetaminophen (TYLENOL) 650 MG CR tablet Take 1,300 mg by mouth every 8 (eight) hours as needed for pain.    Marland Kitchen allopurinol (ZYLOPRIM) 100 MG tablet Take 1 tablet (100 mg total) by mouth daily. 30 tablet 6  . gabapentin (NEURONTIN) 300 MG capsule Take 2 capsules (total = 600 mg), by mouth, 3 times daily. (Patient taking differently: Take 1,200 mg by mouth 3 (three) times daily. Marland Kitchen) 180 capsule 3  . ibuprofen (ADVIL) 800 MG tablet Take 800 mg by mouth every 8 (eight) hours as needed for moderate pain.    . magnesium oxide (MAG-OX) 400 MG tablet Take 400 mg by mouth daily.    . Multiple Vitamin (MULTIVITAMIN) capsule Take 1 capsule by mouth daily.    . ondansetron (ZOFRAN) 4 MG tablet Take 1 tablet (4 mg total) by mouth every 8 (eight) hours as needed for nausea or vomiting. 20 tablet 0  . vitamin B-12 (CYANOCOBALAMIN) 1000 MCG tablet Take 1 tablet (1,000 mcg total) by mouth daily. 30 tablet 3  . acetaminophen (TYLENOL) 500 MG tablet Take 500 mg by mouth every 6 (six) hours as needed for mild pain.     Marland Kitchen colchicine 0.6 MG tablet Take 1 tablet (0.6 mg total) by mouth every 8 (eight) hours as needed (for gouty attacks). (Patient not taking: Reported on 06/28/2019) 30 tablet 3  . doxycycline (VIBRA-TABS) 100 MG tablet Take 1 tablet (100 mg total) by mouth 2 (two) times daily. For skin infection (Patient not taking: Reported on 06/01/2019) 20 tablet 2  . predniSONE (DELTASONE) 50 MG tablet One tablet PO daily for 5 days (Patient not taking: Reported on 06/28/2019) 5 tablet 0  . Vitamin D, Ergocalciferol, (DRISDOL) 1.25 MG (50000 UNIT) CAPS capsule Take 1 capsule (50,000 Units total) by mouth every 7 (seven) days. Reatha Armour per week (Patient not taking: Reported on 06/28/2019) 20 capsule 0   No facility-administered medications prior to visit.    Allergies  Allergen Reactions  . Other Other (See Comments)    All Antibiotics cause severe vaginal yeast infections  . Penicillins Hives, Itching and Swelling    Has  patient had a PCN reaction causing immediate rash, facial/tongue/throat swelling, SOB or lightheadedness with hypotension: Yes Has patient had a PCN reaction causing severe rash involving mucus membranes or skin necrosis: Yes Has patient had a PCN reaction that required hospitalization Yes Has patient had a PCN reaction occurring within the last 10 years: Yes If all of the above answers are "NO",  then may proceed with Cephalosporin use.   . Shellfish-Derived Products Hives  . Shrimp [Shellfish Allergy] Hives  . Sulfa Antibiotics Rash    ROS Review of Systems  Constitutional: Positive for fatigue. Negative for chills and fever.  HENT: Negative for sore throat and trouble swallowing.   Respiratory: Negative for cough and shortness of breath.   Cardiovascular: Negative for chest pain and palpitations.       She has some left-sided chest pain which she thinks is related to her hidradenitis as pain seems to radiate from her left arm.  Gastrointestinal: Positive for nausea. Negative for abdominal pain, blood in stool, constipation and diarrhea.  Endocrine: Negative for polydipsia, polyphagia and polyuria.  Genitourinary: Negative for dysuria and frequency.  Musculoskeletal: Positive for arthralgias. Negative for back pain.  Neurological: Negative for dizziness and headaches.  Hematological: Negative for adenopathy. Does not bruise/bleed easily.      Objective:    Physical Exam  Constitutional: She is oriented to person, place, and time. She appears well-developed and well-nourished.  Well-nourished well-developed overweight for height female in no acute distress wearing mask as per office COVID-19 protocol  Neck: No thyromegaly present.  Cardiovascular: Regular rhythm.  Increased heart rate/tachycardia  Pulmonary/Chest: Effort normal and breath sounds normal. No respiratory distress.  Abdominal: Soft. There is no abdominal tenderness. There is no rebound and no guarding.    Musculoskeletal:        General: Tenderness present. No edema.     Cervical back: Normal range of motion and neck supple.     Comments: Patient with decreased range of motion at the left shoulder/upper arm due to complaint of pain from hidradenitis.  Patient keeps her left arm tightly against her body  Lymphadenopathy:    She has no cervical adenopathy.  Neurological: She is alert and oriented to person, place, and time.  Skin: Skin is warm and dry.  Patient with open nickel sized area with drainage of yellow thick mucoid puslike material in the left axilla.  Patient with area of thickened, slightly fluctuant and erythematous skin on the right lower lateral breast.  Psychiatric: She has a normal mood and affect. Her behavior is normal.  Nursing note and vitals reviewed.   BP 101/68 (BP Location: Right Arm, Patient Position: Sitting, Cuff Size: Large)   Pulse (!) 118   Ht 5' 9"  (1.753 m)   Wt 283 lb 9.6 oz (128.6 kg)   LMP 06/26/2019 (Exact Date)   SpO2 97%   BMI 41.88 kg/m  Wt Readings from Last 3 Encounters:  06/28/19 283 lb 9.6 oz (128.6 kg)  06/01/19 275 lb 9.2 oz (125 kg)  04/27/19 279 lb (126.6 kg)     Health Maintenance Due  Topic Date Due  . PAP SMEAR-Modifier  07/16/2019    Lab Results  Component Value Date   TSH 1.360 04/13/2019   Lab Results  Component Value Date   WBC 10.8 (H) 06/01/2019   HGB 10.4 (L) 06/01/2019   HCT 33.8 (L) 06/01/2019   MCV 81.4 06/01/2019   PLT 532 (H) 06/01/2019   Lab Results  Component Value Date   NA 138 06/01/2019   K 3.6 06/01/2019   CHLORIDE 109 12/18/2014   CO2 23 06/01/2019   GLUCOSE 119 (H) 06/01/2019   BUN 10 06/01/2019   CREATININE 0.74 06/01/2019   BILITOT 0.2 (L) 03/20/2019   ALKPHOS 69 03/20/2019   AST 13 (L) 03/20/2019   ALT 13 03/20/2019   PROT 8.6 (H) 03/20/2019  ALBUMIN 2.9 (L) 03/20/2019   CALCIUM 8.7 (L) 06/01/2019   ANIONGAP 12 06/01/2019   EGFR >90 12/18/2014   No results found for: CHOL No  results found for: HDL No results found for: LDLCALC No results found for: TRIG No results found for: [facility] Lab Results  Component Value Date   HGBA1C 5.1 03/14/2019      Assessment & Plan:  1. Hidradenitis suppurativa Patient with recent flareup of her hidradenitis and currently has open, draining wound in the left axilla and with presence of abscess on the right breast.  Patient is being referred to general surgery for further evaluation and treatment of the right breast abscess as this may require I&D.  She has been placed back on doxycycline 100 mg twice daily x10 days as she is allergic to penicillin and Septra.  Prescription provided for Tylenol 3 to take 1 every 6 hours as needed for moderate pain and prescription for Bactroban to apply to affected skin areas twice daily to help with healing.  Patient also requested refill of ibuprofen which was also provided at today's visit.  Patient reports that she has upcoming surgery for hydradenitis in April. - doxycycline (VIBRA-TABS) 100 MG tablet; Take 1 tablet (100 mg total) by mouth 2 (two) times daily. For skin infection  Dispense: 20 tablet; Refill: 2 - acetaminophen-codeine (TYLENOL #3) 300-30 MG tablet; Take 1 tablet by mouth every 6 (six) hours as needed for up to 5 days for moderate pain.  Dispense: 30 tablet; Refill: 0 - mupirocin ointment (BACTROBAN) 2 %; Apply to affected skin areas twice daily x5 days then as needed  Dispense: 22 g; Refill: 3 - ibuprofen (ADVIL) 800 MG tablet; Take 1 tablet (800 mg total) by mouth every 8 (eight) hours as needed for moderate pain. Eat before taking medication  Dispense: 60 tablet; Refill: 0  2. Normocytic anemia Patient with tachycardia on exam and patient with known normocytic anemia.  We will recheck CBC as patient with hemoglobin of 10.4 during recent emergency department visit. - CBC  3. Tachycardia Patient with tachycardia with heart rate of 118 and 116 on recheck.  Will check CBC as patient  with history of anemia, T4 and TSH to look for overactive thyroid and BMP to look for electrolyte abnormality which may be contributing.  Patient's tachycardia may also be related to her pain due to current hydradenitis flareup. - CBC - T4 AND TSH - Basic Metabolic Panel  4. Abscess of breast Patient with abscess of the right breast and patient is being referred to general surgery for further evaluation as she may need I&D of the area.  She has been placed back on doxycycline and prescription refills provided for Tylenol 3 a new prescription for Bactroban to help with recurrent skin infections. - Ambulatory referral to General Surgery - acetaminophen-codeine (TYLENOL #3) 300-30 MG tablet; Take 1 tablet by mouth every 6 (six) hours as needed for up to 5 days for moderate pain.  Dispense: 30 tablet; Refill: 0 - mupirocin ointment (BACTROBAN) 2 %; Apply to affected skin areas twice daily x5 days then as needed  Dispense: 22 g; Refill: 3  5. Elevated glucose level Patient with elevated glucose level her recent ED visit.  Will check hemoglobin A1c at today's visit to see if she may be prediabetic or diabetic. - Hemoglobin A1c  6. Nausea Patient with complaint of nausea related to pain from hidradenitis.  Refill provided of Zofran. - ondansetron (ZOFRAN) 4 MG tablet; Take 1 tablet (  4 mg total) by mouth every 8 (eight) hours as needed for nausea or vomiting.  Dispense: 20 tablet; Refill: 0  7. Encounter for examination following treatment at hospital Patient is status post emergency department visit on 06/01/2019 and per ED note she was seen for precordial chest pain but per patient, she was having issues with back pain and back pain resolved after treatment with prednisone.  She reports her current chest pain is related to radiation of pain from the left axilla from her current flareup of hidradenitis.   An After Visit Summary was printed and given to the patient.   Follow-up: Return in about 2  weeks (around 07/12/2019) for Chronic issues.   Antony Blackbird, MD

## 2019-06-29 LAB — BASIC METABOLIC PANEL WITH GFR
BUN/Creatinine Ratio: 8 — ABNORMAL LOW (ref 9–23)
BUN: 8 mg/dL (ref 6–20)
CO2: 20 mmol/L (ref 20–29)
Calcium: 9 mg/dL (ref 8.7–10.2)
Chloride: 101 mmol/L (ref 96–106)
Creatinine, Ser: 0.95 mg/dL (ref 0.57–1.00)
GFR calc Af Amer: 88 mL/min/1.73
GFR calc non Af Amer: 76 mL/min/1.73
Glucose: 93 mg/dL (ref 65–99)
Potassium: 4.4 mmol/L (ref 3.5–5.2)
Sodium: 136 mmol/L (ref 134–144)

## 2019-06-29 LAB — HEMOGLOBIN A1C
Est. average glucose Bld gHb Est-mCnc: 117 mg/dL
Hgb A1c MFr Bld: 5.7 % — ABNORMAL HIGH (ref 4.8–5.6)

## 2019-06-29 LAB — CBC
Hematocrit: 34 % (ref 34.0–46.6)
Hemoglobin: 10.4 g/dL — ABNORMAL LOW (ref 11.1–15.9)
MCH: 24 pg — ABNORMAL LOW (ref 26.6–33.0)
MCHC: 30.6 g/dL — ABNORMAL LOW (ref 31.5–35.7)
MCV: 79 fL (ref 79–97)
Platelets: 601 x10E3/uL — ABNORMAL HIGH (ref 150–450)
RBC: 4.33 x10E6/uL (ref 3.77–5.28)
RDW: 16.4 % — ABNORMAL HIGH (ref 11.7–15.4)
WBC: 13.5 x10E3/uL — ABNORMAL HIGH (ref 3.4–10.8)

## 2019-06-29 LAB — T4 AND TSH
T4, Total: 7.5 ug/dL (ref 4.5–12.0)
TSH: 1.99 u[IU]/mL (ref 0.450–4.500)

## 2019-07-10 MED FILL — GABAPENTIN 300 MG CAPSULE: 300 | 30 days supply | Qty: 180 | Fill #3

## 2019-07-12 ENCOUNTER — Ambulatory Visit: Payer: Medicaid Other | Admitting: Family Medicine

## 2019-07-13 NOTE — H&P (Signed)
  Subjective:    Patient ID: Amanda Davenport is a 39 y.o. female.  Follow-up  Here for follow up discussion prior to planned resection axillary hidradenitis with local flap  coverage. Has had hidradenitis for over 10 years affecting axillae, groin, perineum. She has discussed wide excision left axilla with Dr. Kae Heller as this is most symptomatic area. Has tried topical and oral antibiotics in past without effect.  Patient is RHD.  Disabled secondary to hidradenitis  PMH signifcant for WPW and anemia, Hb 10.4 2.25.21.  Lives alone, father who lives in area will be able to assist with post op care.  Review of Systems    Objective:  Physical Exam  Cardiovascular: Normal rate, regular rhythm and normal heart sounds.  Pulmonary/Chest: Effort normal and breath sounds normal.  Neurological: She is alert.   MS: hurley stage 3 left axilla with multiple draining AROM appr 120 degree abduction, diseased area spans 12 x 25 cm Right axilla with no active drainage full ROM  Right breast granulated wound present    Assessment:    Hidradenitis    Plan:    Plan excision left axillary hidradenitis with Dr. Kae Heller and local flap closure from left lateral chest wall. Reviewed options following excision of healing by secondary intention, STSG, vs local flap. All have risk of contraction and scar contracture.The latter offers the least amount contracture. Reviewed  100% chance wound healing problems and may require local wound care. Discussed OP surgery, drain, post op limitations. Additional risks including but not limited to infection, flap necrosis, need for additional procedures, seroma, hematoma, blood clots in legs and lungs reviewed.   Reviewed pictures of scars.  Provided collagen and teaching for every other day application to right breast.  Discussed risk COVID infectionthrough this elective surgery. Patient will receive COVID testing prior to surgery. Discussed even if patient  receivesa negative test result, the tests in some cases may fail to detect the virus or patient maycontract COVID after the test.COVID 19 infectionbefore/during/aftersurgery may result in lead to a higher chance of complication and death.

## 2019-07-14 ENCOUNTER — Ambulatory Visit: Payer: Medicaid Other | Admitting: Family Medicine

## 2019-07-18 MED FILL — COLCHICINE 0.6 MG TABS: 0.6 | 10 days supply | Qty: 30 | Fill #3

## 2019-07-18 MED FILL — ALLOPURINOL 100 MG TABLET: 100 | 30 days supply | Qty: 30 | Fill #3

## 2019-07-18 NOTE — Patient Instructions (Addendum)
DUE TO COVID-19 ONLY ONE VISITOR IS ALLOWED TO COME WITH YOU AND STAY IN THE WAITING ROOM ONLY DURING PRE OP AND PROCEDURE DAY OF SURGERY. THE 1 VISITOR MAY VISIT WITH YOU AFTER SURGERY IN YOUR PRIVATE ROOM DURING VISITING HOURS ONLY!  YOU NEED TO HAVE A COVID 19 TEST ON: 07/25/19 @ 9:15 AM , THIS TEST MUST BE DONE BEFORE SURGERY, COME  [place], [place] , [address].  ([place]) ONCE YOUR COVID TEST IS COMPLETED, PLEASE BEGIN THE QUARANTINE INSTRUCTIONS AS OUTLINED IN YOUR HANDOUT.                Amanda Davenport   Your procedure is scheduled on: 07/28/19   Report to [facility] Main  Entrance   Report to SHORT STAY at: 5:30 AM     Call this number if you have problems the morning of surgery [phone]    Remember: Do not eat food or drink liquids :After Midnight.   BRUSH YOUR TEETH MORNING OF SURGERY AND RINSE YOUR MOUTH OUT, NO CHEWING GUM CANDY OR MINTS.     Take these medicines the morning of surgery with A SIP OF WATER: COLCHICINE,LORATADINE.GABAPENTIN AND ALLOPURINOL AS NEEDED.                                 You may not have any metal on your body including hair pins and              piercings  Do not wear jewelry, make-up, lotions, powders or perfumes, deodorant             Do not wear nail polish on your fingernails.  Do not shave  48 hours prior to surgery.              Men may shave face and neck.   Do not bring valuables to the hospital. [place].  Contacts, dentures or bridgework may not be worn into surgery.  Leave suitcase in the car. After surgery it may be brought to your room.     Patients discharged the day of surgery will not be allowed to drive home. IF YOU ARE HAVING SURGERY AND GOING HOME THE SAME DAY, YOU MUST HAVE AN ADULT TO DRIVE YOU HOME AND BE WITH YOU FOR 24 HOURS. YOU MAY GO HOME BY TAXI OR UBER OR ORTHERWISE, BUT AN ADULT MUST ACCOMPANY YOU HOME AND STAY WITH YOU  FOR 24 HOURS.  Name and phone number of your driver:  Special Instructions: N/A              Please read over the following fact sheets you were given: _____________________________________________________________________             [facility] - Preparing for Surgery Before surgery, you can play an important role.  Because skin is not sterile, your skin needs to be as free of germs as possible.  You can reduce the number of germs on your skin by washing with CHG (chlorahexidine gluconate) soap before surgery.  CHG is an antiseptic cleaner which kills germs and bonds with the skin to continue killing germs even after washing. Please DO NOT use if you have an allergy to CHG or antibacterial soaps.  If your skin becomes reddened/irritated stop using the CHG and inform  your nurse when you arrive at Short Stay. Do not shave (including legs and underarms) for at least 48 hours prior to the first CHG shower.  You may shave your face/neck. Please follow these instructions carefully:  1.  Shower with CHG Soap the night before surgery and the  morning of Surgery.  2.  If you choose to wash your hair, wash your hair first as usual with your  normal  shampoo.  3.  After you shampoo, rinse your hair and body thoroughly to remove the  shampoo.                           4.  Use CHG as you would any other liquid soap.  You can apply chg directly  to the skin and wash                       Gently with a scrungie or clean washcloth.  5.  Apply the CHG Soap to your body ONLY FROM THE NECK DOWN.   Do not use on face/ open                           Wound or open sores. Avoid contact with eyes, ears mouth and genitals (private parts).                       Wash face,  Genitals (private parts) with your normal soap.             6.  Wash thoroughly, paying special attention to the area where your surgery  will be performed.  7.  Thoroughly rinse your body with warm water from the neck down.  8.  DO NOT shower/wash  with your normal soap after using and rinsing off  the CHG Soap.                9.  Pat yourself dry with a clean towel.            10.  Wear clean pajamas.            11.  Place clean sheets on your bed the night of your first shower and do not  sleep with pets. Day of Surgery : Do not apply any lotions/deodorants the morning of surgery.  Please wear clean clothes to the hospital/surgery center.  FAILURE TO FOLLOW THESE INSTRUCTIONS MAY RESULT IN THE CANCELLATION OF YOUR SURGERY PATIENT SIGNATURE_________________________________  NURSE SIGNATURE__________________________________  ________________________________________________________________________

## 2019-07-19 ENCOUNTER — Encounter (HOSPITAL_COMMUNITY)
Admission: RE | Admit: 2019-07-19 | Discharge: 2019-07-19 | Disposition: A | Discharge status: Home / Self Care | Payer: Medicare Other | Source: Ambulatory Visit | Attending: Surgery | Admitting: Surgery

## 2019-07-19 ENCOUNTER — Other Ambulatory Visit: Payer: Self-pay

## 2019-07-19 ENCOUNTER — Encounter (HOSPITAL_COMMUNITY): Payer: Self-pay

## 2019-07-19 DIAGNOSIS — Z01812 Encounter for preprocedural laboratory examination: Secondary | ICD-10-CM | POA: Diagnosis not present

## 2019-07-19 HISTORY — DX: Prediabetes: R73.03

## 2019-07-19 HISTORY — DX: Anxiety disorder, unspecified: F41.9

## 2019-07-19 HISTORY — DX: Depression, unspecified: F32.A

## 2019-07-19 HISTORY — DX: Pneumonia, unspecified organism: J18.9

## 2019-07-19 HISTORY — DX: Cardiac arrhythmia, unspecified: I49.9

## 2019-07-19 LAB — CBC
HCT: 33.6 % — ABNORMAL LOW (ref 36.0–46.0)
Hemoglobin: 10.1 g/dL — ABNORMAL LOW (ref 12.0–15.0)
MCH: 24.5 pg — ABNORMAL LOW (ref 26.0–34.0)
MCHC: 30.1 g/dL (ref 30.0–36.0)
MCV: 81.4 fL (ref 80.0–100.0)
Platelets: 551 10*3/uL — ABNORMAL HIGH (ref 150–400)
RBC: 4.13 MIL/uL (ref 3.87–5.11)
RDW: 18.3 % — ABNORMAL HIGH (ref 11.5–15.5)
WBC: 11.1 10*3/uL — ABNORMAL HIGH (ref 4.0–10.5)
nRBC: 0 % (ref 0.0–0.2)

## 2019-07-19 LAB — BASIC METABOLIC PANEL
Anion gap: 9 (ref 5–15)
BUN: 9 mg/dL (ref 6–20)
CO2: 25 mmol/L (ref 22–32)
Calcium: 8.9 mg/dL (ref 8.9–10.3)
Chloride: 107 mmol/L (ref 98–111)
Creatinine, Ser: 0.94 mg/dL (ref 0.44–1.00)
GFR calc Af Amer: >60 mL/min (ref >60)
GFR calc non Af Amer: >60 mL/min (ref >60)
Glucose, Bld: 118 mg/dL — ABNORMAL HIGH (ref 70–99)
Potassium: 3.6 mmol/L (ref 3.5–5.1)
Sodium: 141 mmol/L (ref 135–145)

## 2019-07-19 NOTE — Progress Notes (Signed)
PCP - Dr. Antony Blackbird. LOV: 04/27/19 Cardiologist -   Chest x-ray - 06/01/19. EPIC EKG - 06/01/19. EPIC Stress Test -  ECHO -  Cardiac Cath -   Sleep Study -  CPAP -   Fasting Blood Sugar -  Checks Blood Sugar _____ times a day  Blood Thinner Instructions: Aspirin Instructions: Last Dose:  Anesthesia review: Pt. Was informed by RN,that she should stop smoking marihuana before surgery.Pt. verbalized her understanding of the conversation.  Patient denies shortness of breath, fever, cough and chest pain at PAT appointment   Patient verbalized understanding of instructions that were given to them at the PAT appointment. Patient was also instructed that they will need to review over the PAT instructions again at home before surgery.

## 2019-07-25 ENCOUNTER — Other Ambulatory Visit (HOSPITAL_COMMUNITY)
Admission: RE | Admit: 2019-07-25 | Discharge: 2019-07-25 | Disposition: A | Discharge status: Home / Self Care | Payer: Medicare Other | Source: Ambulatory Visit | Attending: Surgery | Admitting: Surgery

## 2019-07-25 DIAGNOSIS — Z01812 Encounter for preprocedural laboratory examination: Secondary | ICD-10-CM | POA: Diagnosis present

## 2019-07-25 DIAGNOSIS — Z20822 Contact with and (suspected) exposure to covid-19: Secondary | ICD-10-CM | POA: Insufficient documentation

## 2019-07-25 LAB — SARS CORONAVIRUS 2 (TAT 6-24 HRS): SARS Coronavirus 2: NEGATIVE

## 2019-07-27 MED ORDER — VANCOMYCIN HCL 1500 MG/300ML IV SOLN
1500.0000 mg | INTRAVENOUS | Status: AC
  Administered 2019-07-28: 1500 mg via INTRAVENOUS
  Filled 2019-07-27: qty 300

## 2019-07-27 NOTE — Anesthesia Preprocedure Evaluation (Addendum)
Anesthesia Evaluation  Patient identified by MRN, date of birth, ID band Patient awake    Reviewed: Allergy & Precautions, NPO status , Patient's Chart, lab work & pertinent test results  Airway Mallampati: II  TM Distance: >3 FB Neck ROM: Full    Dental  (+) Missing, Dental Advisory Given,    Pulmonary neg pulmonary ROS, former smoker,    Pulmonary exam normal breath sounds clear to auscultation       Cardiovascular Normal cardiovascular exam+ dysrhythmias (WPW, on metoprolol)  Rhythm:Regular Rate:Normal     Neuro/Psych  Headaches, PSYCHIATRIC DISORDERS Anxiety Depression    GI/Hepatic negative GI ROS, (+)     substance abuse  marijuana use,   Endo/Other  Morbid obesity  Renal/GU negative Renal ROS  negative genitourinary   Musculoskeletal negative musculoskeletal ROS (+)   Abdominal   Peds  Hematology  (+) Blood dyscrasia (Hgb 10.1), anemia ,   Anesthesia Other Findings   Reproductive/Obstetrics                            Anesthesia Physical Anesthesia Plan  ASA: III  Anesthesia Plan: General   Post-op Pain Management:    Induction: Intravenous  PONV Risk Score and Plan: 3 and Midazolam, Dexamethasone and Ondansetron  Airway Management Planned: Oral ETT  Additional Equipment:   Intra-op Plan:   Post-operative Plan: Extubation in OR  Informed Consent: I have reviewed the patients History and Physical, chart, labs and discussed the procedure including the risks, benefits and alternatives for the proposed anesthesia with the patient or authorized representative who has indicated his/her understanding and acceptance.     Dental advisory given  Plan Discussed with: CRNA  Anesthesia Plan Comments:        Anesthesia Quick Evaluation

## 2019-07-27 NOTE — H&P (Signed)
Amanda Davenport  DOB: [birth year] Single / Language: [name] / Race: Black or African American Female   History of Present Illness  Patient words: This is a very pleasant 39 year old woman with a 10 year or longer history of hidradenitis supurativa. She has lesions in bilateral axilla, lower abdomen/groin, perineum and buttocks. Chronic drainage, intermittent flares of pain and swelling. She has tried everything. She has been on and off antibiotics for years with no improvement in addition to which she had adverse GI effects. She has tried naturopathic remedies, she has been religious about keeping this area clean and dry, breathable clothing, no chemical deodorants or shaving, etc. Has done everything prescribed by her medical team. Her medical history is otherwise notable for anemia and Wolff-Parkinson-White syndrome. 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    Diagnostic Studies History Colonoscopy  5-10 years ago Mammogram  >3 years ago Pap Smear  1-5 years ago  Allergies  Penicillins  Hives, Itching, Swelling. Shellfish-derived Products  Hives. Shrimp  Hives. Sulfa Antibiotics  Rash.  Medication History  Tylenol 8 Hour (650MG  Tablet ER, Oral) Active. Acetaminophen-Codeine (300-30MG  Tablet, Oral) Active. Medications Reconciled  Social History Alcohol use  Remotely quit alcohol use. Caffeine use  Carbonated beverages, Tea. Tobacco use  Current some day smoker.  Family History Breast Cancer  Mother. Colon Cancer  Mother.  Pregnancy / Birth History  Gravida  0 Para  0 Regular periods   Other Problems Depression  Other disease, cancer, significant illness  Transfusion history     Review of Systems  General Present- Fatigue and Night Sweats. Not Present- Appetite Loss, Chills, Fever, Weight Gain and Weight Loss. Skin Present- New Lesions and Non-Healing Wounds. Not Present- Change in Wart/Mole,  Dryness, Hives, Jaundice, Rash and Ulcer. HEENT Present- Wears glasses/contact lenses. Not Present- Earache, Hearing Loss, Hoarseness, Nose Bleed, Oral Ulcers, Ringing in the Ears, Seasonal Allergies, Sinus Pain, Sore Throat, Visual Disturbances and Yellow Eyes. Respiratory Not Present- Bloody sputum, Chronic Cough, Difficulty Breathing, Snoring and Wheezing. Breast Present- Breast Mass. Not Present- Breast Pain, Nipple Discharge and Skin Changes. Cardiovascular Present- Palpitations and Rapid Heart Rate. Not Present- Chest Pain, Difficulty Breathing Lying Down, Leg Cramps, Shortness of Breath and Swelling of Extremities. Gastrointestinal Not Present- Abdominal Pain, Bloating, Bloody Stool, Change in Bowel Habits, Chronic diarrhea, Constipation, Difficulty Swallowing, Excessive gas, Gets full quickly at meals, Hemorrhoids, Indigestion, Nausea, Rectal Pain and Vomiting. Female Genitourinary Not Present- Frequency, Nocturia, Painful Urination, Pelvic Pain and Urgency. Musculoskeletal Present- Muscle Pain. Not Present- Back Pain, Joint Pain, Joint Stiffness, Muscle Weakness and Swelling of Extremities. Neurological Present- Trouble walking. Not Present- Decreased Memory, Fainting, Headaches, Numbness, Seizures, Tingling, Tremor and Weakness. Psychiatric Present- Anxiety and Depression. Not Present- Bipolar, Change in Sleep Pattern, Fearful and Frequent crying. Endocrine Not Present- Cold Intolerance, Excessive Hunger, Hair Changes, Heat Intolerance, Hot flashes and New Diabetes. Hematology Present- Persistent Infections. Not Present- Blood Thinners, Easy Bruising, Excessive bleeding, Gland problems and HIV. All other systems negative  Vitals Weight: 276.38 lb Height: 69in Body Surface Area: 2.37 m Body Mass Index: 40.81 kg/m  Temp.: 98.52F  Pulse: 92 (Regular)  BP: 122/86 (Sitting, Left Arm, Standard)   Physical Exam  She is alert and well-appearing Unlabored respirations Hurley 3  hidradenitis in bilateral axilla, on the left this extends slightly onto the chest wall    Assessment & Plan  HIDRADENITIS SUPPURATIVA (L73.2) Story: We discussed the pathophysiology of this disease and treatment options including -  brewers yeast free diet, -Smoking cessation -no shave, no chemical deodorants, keeping the area clean and dry, -topical treatments such as clindamycin/retinoin, -suppressive oral antibiotics, -systemic immunotherapy, -and finally surgery which ranges from local unroofing to wide excision with rotational flap coverage would be the only way to ensure no further flares, we discussed that this is a large surgery requiring several weeks of wound healing, with risk of bleeding, infection, pain, scarring and nearly 100% issue with wound healing.  The disease in her axilla is not amenable to medical therapy, and she has are tried most modalities with failure to improve. She may have some decrease in severity and frequency of flares of the Brewers yeast free diet. I recommend excision and possible flap coverage for the axillary disease. Discussed that we will refer her to a plastic surgeon for the flap coverage. Discussed this procedure in detail, risks of bleeding, infection, pain, scarring, hematoma or seroma, wound healing problems. Questions were answered. We'll coordinate case with plastic surgery WPW (WOLFF-PARKINSON-WHITE SYNDROME) (I45.6) OBESITY, MORBID, BMI 40.0-49.9 (E66.01) NEUROPATHY (G62.9) ANEMIA (D64.9)

## 2019-07-28 ENCOUNTER — Ambulatory Visit (HOSPITAL_COMMUNITY)
Admission: RE | Admit: 2019-07-28 | Discharge: 2019-07-28 | Disposition: A | Discharge status: Home / Self Care | Payer: Medicare Other | Attending: Surgery | Admitting: Surgery

## 2019-07-28 ENCOUNTER — Encounter (HOSPITAL_COMMUNITY)
Admission: RE | Disposition: A | Discharge status: Home / Self Care | Payer: Self-pay | Source: Home / Self Care | Attending: Surgery

## 2019-07-28 ENCOUNTER — Ambulatory Visit (HOSPITAL_COMMUNITY): Payer: Medicare Other | Admitting: Anesthesiology

## 2019-07-28 ENCOUNTER — Encounter (HOSPITAL_COMMUNITY): Payer: Self-pay | Admitting: Surgery

## 2019-07-28 DIAGNOSIS — F1210 Cannabis abuse, uncomplicated: Secondary | ICD-10-CM | POA: Diagnosis not present

## 2019-07-28 DIAGNOSIS — F419 Anxiety disorder, unspecified: Secondary | ICD-10-CM | POA: Insufficient documentation

## 2019-07-28 DIAGNOSIS — F17200 Nicotine dependence, unspecified, uncomplicated: Secondary | ICD-10-CM | POA: Insufficient documentation

## 2019-07-28 DIAGNOSIS — F329 Major depressive disorder, single episode, unspecified: Secondary | ICD-10-CM | POA: Diagnosis not present

## 2019-07-28 DIAGNOSIS — D649 Anemia, unspecified: Secondary | ICD-10-CM | POA: Diagnosis not present

## 2019-07-28 DIAGNOSIS — G629 Polyneuropathy, unspecified: Secondary | ICD-10-CM | POA: Diagnosis not present

## 2019-07-28 DIAGNOSIS — Z6841 Body Mass Index (BMI) 40.0 and over, adult: Secondary | ICD-10-CM | POA: Diagnosis not present

## 2019-07-28 DIAGNOSIS — I456 Pre-excitation syndrome: Secondary | ICD-10-CM | POA: Diagnosis not present

## 2019-07-28 DIAGNOSIS — L732 Hidradenitis suppurativa: Secondary | ICD-10-CM | POA: Diagnosis present

## 2019-07-28 HISTORY — PX: ADJACENT TISSUE TRANSFER/TISSUE REARRANGEMENT: SHX6829

## 2019-07-28 HISTORY — PX: HYDRADENITIS EXCISION: SHX5243

## 2019-07-28 LAB — PREGNANCY, URINE: Preg Test, Ur: NEGATIVE

## 2019-07-28 SURGERY — EXCISION, HIDRADENITIS, AXILLA
Anesthesia: General | Laterality: Left

## 2019-07-28 MED ORDER — ONDANSETRON HCL 4 MG/2ML IJ SOLN
INTRAMUSCULAR | Status: AC
  Filled 2019-07-28: qty 2

## 2019-07-28 MED ORDER — BUPIVACAINE HCL 0.25 % IJ SOLN
INTRAMUSCULAR | Status: AC
  Filled 2019-07-28: qty 1

## 2019-07-28 MED ORDER — DEXAMETHASONE SODIUM PHOSPHATE 4 MG/ML IJ SOLN
INTRAMUSCULAR | Status: DC | PRN
  Administered 2019-07-28: 10 mg via INTRAVENOUS

## 2019-07-28 MED ORDER — FENTANYL CITRATE (PF) 100 MCG/2ML IJ SOLN
INTRAMUSCULAR | Status: DC | PRN
  Administered 2019-07-28 (×2): 25 ug via INTRAVENOUS
  Administered 2019-07-28: 100 ug via INTRAVENOUS
  Administered 2019-07-28: 25 ug via INTRAVENOUS
  Administered 2019-07-28: 50 ug via INTRAVENOUS
  Administered 2019-07-28: 25 ug via INTRAVENOUS
  Administered 2019-07-28: 50 ug via INTRAVENOUS

## 2019-07-28 MED ORDER — ACETAMINOPHEN 500 MG PO TABS
1000.0000 mg | ORAL_TABLET | ORAL | Status: AC
  Administered 2019-07-28: 1000 mg via ORAL

## 2019-07-28 MED ORDER — LIP MEDEX EX OINT
TOPICAL_OINTMENT | CUTANEOUS | Status: AC
  Filled 2019-07-28: qty 7

## 2019-07-28 MED ORDER — SODIUM CHLORIDE 0.9% FLUSH: 3.0000 mL | Freq: Two times a day (BID) | INTRAVENOUS | Status: DC

## 2019-07-28 MED ORDER — PROPOFOL 10 MG/ML IV BOLUS
INTRAVENOUS | Status: AC
  Filled 2019-07-28: qty 20

## 2019-07-28 MED ORDER — ACETAMINOPHEN 500 MG PO TABS
1000.0000 mg | ORAL_TABLET | Freq: Once | ORAL | Status: DC
  Filled 2019-07-28: qty 2

## 2019-07-28 MED ORDER — GABAPENTIN 300 MG PO CAPS
300.0000 mg | ORAL_CAPSULE | ORAL | Status: AC
  Administered 2019-07-28: 300 mg via ORAL
  Filled 2019-07-28: qty 1

## 2019-07-28 MED ORDER — LIDOCAINE 2% (20 MG/ML) 5 ML SYRINGE
INTRAMUSCULAR | Status: DC | PRN
  Administered 2019-07-28: 100 mg via INTRAVENOUS

## 2019-07-28 MED ORDER — MIDAZOLAM HCL 2 MG/2ML IJ SOLN
INTRAMUSCULAR | Status: AC
  Filled 2019-07-28: qty 2

## 2019-07-28 MED ORDER — DEXAMETHASONE SODIUM PHOSPHATE 10 MG/ML IJ SOLN
INTRAMUSCULAR | Status: AC
  Filled 2019-07-28: qty 1

## 2019-07-28 MED ORDER — CHLORHEXIDINE GLUCONATE 4 % EX LIQD: 60.0000 mL | Freq: Once | CUTANEOUS | Status: DC

## 2019-07-28 MED ORDER — FENTANYL CITRATE (PF) 100 MCG/2ML IJ SOLN
INTRAMUSCULAR | Status: AC
  Filled 2019-07-28: qty 2

## 2019-07-28 MED ORDER — ACETAMINOPHEN 650 MG RE SUPP
650.0000 mg | RECTAL | Status: DC | PRN
  Filled 2019-07-28: qty 1

## 2019-07-28 MED ORDER — OXYCODONE HCL 5 MG PO TABS: 5.0000 mg | ORAL_TABLET | Freq: Three times a day (TID) | ORAL | 0 refills | Status: DC | PRN

## 2019-07-28 MED ORDER — AMIODARONE HCL IN DEXTROSE 360-4.14 MG/200ML-% IV SOLN
60.0000 mg/h | INTRAVENOUS | Status: DC
  Filled 2019-07-28: qty 200

## 2019-07-28 MED ORDER — DOCUSATE SODIUM 100 MG PO CAPS
100.0000 mg | ORAL_CAPSULE | Freq: Two times a day (BID) | ORAL | 0 refills | Status: AC
Start: 2019-07-28 — End: 2019-08-27

## 2019-07-28 MED ORDER — ESMOLOL HCL 100 MG/10ML IV SOLN
INTRAVENOUS | Status: AC
  Filled 2019-07-28: qty 10

## 2019-07-28 MED ORDER — SUGAMMADEX SODIUM 200 MG/2ML IV SOLN
INTRAVENOUS | Status: DC | PRN
  Administered 2019-07-28: 200 mg via INTRAVENOUS

## 2019-07-28 MED ORDER — BACITRACIN ZINC 500 UNIT/GM EX OINT
TOPICAL_OINTMENT | CUTANEOUS | Status: AC
  Filled 2019-07-28: qty 28.35

## 2019-07-28 MED ORDER — OXYCODONE HCL 5 MG PO TABS: 5.0000 mg | ORAL_TABLET | ORAL | Status: DC | PRN

## 2019-07-28 MED ORDER — ROCURONIUM BROMIDE 50 MG/5ML IV SOSY
PREFILLED_SYRINGE | INTRAVENOUS | Status: DC | PRN
  Administered 2019-07-28: 100 mg via INTRAVENOUS

## 2019-07-28 MED ORDER — PROPOFOL 10 MG/ML IV BOLUS
INTRAVENOUS | Status: DC | PRN
  Administered 2019-07-28: 200 mg via INTRAVENOUS
  Administered 2019-07-28 (×2): 40 mg via INTRAVENOUS

## 2019-07-28 MED ORDER — LIDOCAINE HCL 1 % IJ SOLN
INTRAMUSCULAR | Status: DC | PRN
  Administered 2019-07-28: 24 mL via INTRAMUSCULAR

## 2019-07-28 MED ORDER — MIDAZOLAM HCL 5 MG/5ML IJ SOLN
INTRAMUSCULAR | Status: DC | PRN
  Administered 2019-07-28: 2 mg via INTRAVENOUS

## 2019-07-28 MED ORDER — 0.9 % SODIUM CHLORIDE (POUR BTL) OPTIME
TOPICAL | Status: DC | PRN
  Administered 2019-07-28: 1000 mL

## 2019-07-28 MED ORDER — FENTANYL CITRATE (PF) 100 MCG/2ML IJ SOLN: 25.0000 ug | INTRAMUSCULAR | Status: DC | PRN

## 2019-07-28 MED ORDER — SODIUM CHLORIDE 0.9% FLUSH: 3.0000 mL | INTRAVENOUS | Status: DC | PRN

## 2019-07-28 MED ORDER — ONDANSETRON HCL 4 MG/2ML IJ SOLN
INTRAMUSCULAR | Status: DC | PRN
  Administered 2019-07-28: 4 mg via INTRAVENOUS

## 2019-07-28 MED ORDER — FENTANYL CITRATE (PF) 100 MCG/2ML IJ SOLN
25.0000 ug | INTRAMUSCULAR | Status: DC | PRN
  Administered 2019-07-28 (×2): 50 ug via INTRAVENOUS

## 2019-07-28 MED ORDER — ROCURONIUM BROMIDE 10 MG/ML (PF) SYRINGE
PREFILLED_SYRINGE | INTRAVENOUS | Status: AC
  Filled 2019-07-28: qty 10

## 2019-07-28 MED ORDER — SODIUM CHLORIDE 0.9 % IV SOLN: 250.0000 mL | INTRAVENOUS | Status: DC | PRN

## 2019-07-28 MED ORDER — BUPIVACAINE-EPINEPHRINE (PF) 0.5% -1:200000 IJ SOLN
INTRAMUSCULAR | Status: AC
  Filled 2019-07-28: qty 30

## 2019-07-28 MED ORDER — PROCAINAMIDE HCL 100 MG/ML IJ SOLN
2000.0000 mg | INTRAMUSCULAR | Status: DC
  Filled 2019-07-28: qty 20

## 2019-07-28 MED ORDER — ACETAMINOPHEN 325 MG PO TABS: 650.0000 mg | ORAL_TABLET | ORAL | Status: DC | PRN

## 2019-07-28 MED ORDER — BACITRACIN ZINC 500 UNIT/GM EX OINT
TOPICAL_OINTMENT | CUTANEOUS | Status: DC | PRN
  Administered 2019-07-28: 1 via TOPICAL

## 2019-07-28 MED ORDER — LIDOCAINE 2% (20 MG/ML) 5 ML SYRINGE
INTRAMUSCULAR | Status: AC
  Filled 2019-07-28: qty 5

## 2019-07-28 MED ORDER — LACTATED RINGERS IV SOLN: INTRAVENOUS | Status: DC

## 2019-07-28 MED ORDER — ESMOLOL HCL 100 MG/10ML IV SOLN
INTRAVENOUS | Status: DC | PRN
Start: 2019-07-28 — End: 2019-07-28
  Administered 2019-07-28 (×2): 20 mg via INTRAVENOUS
  Administered 2019-07-28: 30 mg via INTRAVENOUS
  Administered 2019-07-28: 20 mg via INTRAVENOUS
  Administered 2019-07-28: 30 mg via INTRAVENOUS

## 2019-07-28 SURGICAL SUPPLY — 47 items
ADH SKN CLS APL DERMABOND .7 (GAUZE/BANDAGES/DRESSINGS)
BINDER BREAST XLRG (GAUZE/BANDAGES/DRESSINGS) IMPLANT
BINDER BREAST XXLRG (GAUZE/BANDAGES/DRESSINGS) ×2 IMPLANT
BLADE HEX COATED 2.75 (ELECTRODE) ×3 IMPLANT
BLADE SURG 15 STRL LF DISP TIS (BLADE) ×1 IMPLANT
BLADE SURG 15 STRL SS (BLADE) ×3
BNDG COHESIVE 4X5 TAN STRL (GAUZE/BANDAGES/DRESSINGS) ×2 IMPLANT
COVER SURGICAL LIGHT HANDLE (MISCELLANEOUS) ×6 IMPLANT
COVER WAND RF STERILE (DRAPES) ×2 IMPLANT
DECANTER SPIKE VIAL GLASS SM (MISCELLANEOUS) ×2 IMPLANT
DERMABOND ADVANCED (GAUZE/BANDAGES/DRESSINGS)
DERMABOND ADVANCED .7 DNX12 (GAUZE/BANDAGES/DRESSINGS) IMPLANT
DRAIN CHANNEL 15F RND FF 3/16 (WOUND CARE) IMPLANT
DRAIN CHANNEL 19F RND (DRAIN) ×2 IMPLANT
DRAPE LAPAROTOMY T 102X78X121 (DRAPES) ×3 IMPLANT
DRAPE LAPAROTOMY TRNSV 102X78 (DRAPES) IMPLANT
DRAPE UTILITY XL STRL (DRAPES) ×5 IMPLANT
DRSG PAD ABDOMINAL 8X10 ST (GAUZE/BANDAGES/DRESSINGS) ×2 IMPLANT
ELECT COATED BLADE 2.86 ST (ELECTRODE) ×2 IMPLANT
ELECT REM PT RETURN 15FT ADLT (MISCELLANEOUS) ×6 IMPLANT
EVACUATOR SILICONE 100CC (DRAIN) ×2 IMPLANT
GAUZE SPONGE 4X4 12PLY STRL (GAUZE/BANDAGES/DRESSINGS) ×6 IMPLANT
GLOVE BIO SURGEON STRL SZ 6 (GLOVE) ×6 IMPLANT
GLOVE INDICATOR 6.5 STRL GRN (GLOVE) ×3 IMPLANT
GLOVE SURG SS PI 6.0 STRL IVOR (GLOVE) ×3 IMPLANT
GOWN STRL REUS W/TWL LRG LVL3 (GOWN DISPOSABLE) ×6 IMPLANT
GOWN STRL REUS W/TWL XL LVL3 (GOWN DISPOSABLE) ×6 IMPLANT
KIT BASIN (CUSTOM PROCEDURE TRAY) ×4 IMPLANT
KIT TURNOVER KIT A (KITS) IMPLANT
MARKER SKIN DUAL TIP RULER LAB (MISCELLANEOUS) ×2 IMPLANT
NEEDLE HYPO 22GX1.5 SAFETY (NEEDLE) ×3 IMPLANT
PACK BASIC VI WITH GOWN DISP (CUSTOM PROCEDURE TRAY) ×3 IMPLANT
PACK GENERAL/GYN (CUSTOM PROCEDURE TRAY) ×3 IMPLANT
PENCIL SMOKE EVACUATOR (MISCELLANEOUS) ×2 IMPLANT
SPONGE LAP 18X18 RF (DISPOSABLE) ×2 IMPLANT
STAPLER VISISTAT 35W (STAPLE) ×3 IMPLANT
SUT ETHILON 4 0 PS 2 18 (SUTURE) ×12 IMPLANT
SUT MNCRL AB 3-0 PS2 18 (SUTURE) ×6 IMPLANT
SUT MNCRL AB 4-0 PS2 18 (SUTURE) ×1 IMPLANT
SUT PDS AB 2-0 CT2 27 (SUTURE) ×8 IMPLANT
SUT VIC AB 3-0 SH 27 (SUTURE) ×3
SUT VIC AB 3-0 SH 27XBRD (SUTURE) ×1 IMPLANT
SWAB COLLECTION DEVICE MRSA (MISCELLANEOUS) IMPLANT
SYR CONTROL 10ML LL (SYRINGE) ×3 IMPLANT
TOWEL OR 17X26 10 PK STRL BLUE (TOWEL DISPOSABLE) ×6 IMPLANT
TOWEL OR NON WOVEN STRL DISP B (DISPOSABLE) ×3 IMPLANT
YANKAUER SUCT BULB TIP 10FT TU (MISCELLANEOUS) IMPLANT

## 2019-07-28 NOTE — Transfer of Care (Signed)
Immediate Anesthesia Transfer of Care Note  Patient: Amanda Davenport  Procedure(s) Performed: Procedure(s): EXCISION LEFT HIDRADENITIS AXILLA (Left) ADJACENT TISSUE TRANSFER TO LEFT AXILLA GREATER THAN 100 CM SQUARED (Left)  Patient Location: PACU  Anesthesia Type:General  Level of Consciousness: Patient easily awoken, sedated, comfortable, cooperative, following commands, responds to stimulation.   Airway & Oxygen Therapy: Patient spontaneously breathing, ventilating well, oxygen via simple oxygen mask.  Post-op Assessment: Report given to PACU RN, vital signs reviewed and stable, moving all extremities.   Post vital signs: Reviewed and stable.  Complications: No apparent anesthesia complications Last Vitals:  Vitals Value Taken Time  BP 107/77 07/28/19 1051  Temp    Pulse 92 07/28/19 1056  Resp 23 07/28/19 1056  SpO2 100 % 07/28/19 1056  Vitals shown include unvalidated device data.  Last Pain:  Vitals:   07/28/19 0610  TempSrc:   PainSc: 9       Patients Stated Pain Goal: 7 ([id number])  Complications: No apparent anesthesia complications

## 2019-07-28 NOTE — Interval H&P Note (Signed)
History and Physical Interval Note:  07/28/2019 7:13 AM  Amanda Davenport  has presented today for surgery, with the diagnosis of HIDRADENITIS.  The various methods of treatment have been discussed with the patient and family. After consideration of risks, benefits and other options for treatment, the patient has consented to  Procedure(s): EXCISION LEFT HIDRADENITIS AXILLA (Left) ADJACENT TISSUE TRANSFER TO LEFT AXILLA GREATER THAN 100 CM SQUARED (Left) as a surgical intervention.  The patient's history has been reviewed, patient examined, no change in status, stable for surgery.  I have reviewed the patient's chart and labs.  Questions were answered to the patient's satisfaction.     Arnoldo Hooker Breyon Blass

## 2019-07-28 NOTE — Anesthesia Procedure Notes (Signed)
Procedure Name: Intubation Date/Time: 07/28/2019 7:43 AM Performed by: Deliah Boston, CRNA Pre-anesthesia Checklist: Patient identified, Emergency Drugs available, Suction available and Patient being monitored Patient Re-evaluated:Patient Re-evaluated prior to induction Oxygen Delivery Method: Circle system utilized Preoxygenation: Pre-oxygenation with 100% oxygen Induction Type: IV induction Ventilation: Mask ventilation without difficulty Laryngoscope Size: Mac and 3 Grade View: Grade I Tube type: Oral Tube size: 7.0 mm Number of attempts: 1 Airway Equipment and Method: Stylet and Oral airway Placement Confirmation: ETT inserted through vocal cords under direct vision,  positive ETCO2 and breath sounds checked- equal and bilateral Secured at: 19 cm Tube secured with: Tape Dental Injury: Teeth and Oropharynx as per pre-operative assessment

## 2019-07-28 NOTE — Op Note (Signed)
Operative Note  Amanda Davenport  [id number]:[id number]  [id number]  07/28/2019   Surgeon: Vikki Ports A ConnorMD  Procedure performed: Excision of left axillary skin, subcutaneous tissue, and small segment of fascia; wound 25 x 20 x 5 cm  Preop diagnosis: Hidradenitis Post-op diagnosis/intraop findings: Same, extending onto left lateral breast and into the fascia of the left upper arm/axilla  Specimens: Left axillary hidradenitis EBL: 123XX123 cc Complications: none  Description of procedure: After obtaining informed consent the patient was taken to the operating room and placed supine on operating room table wheregeneral endotracheal anesthesia was initiated, preoperative antibiotics were administered, SCDs applied, and a formal timeout was performed.  The left axilla, arm, torso and chest were prepped and draped in usual sterile fashion.  The planned incision was marked to include all areas of chronic scarring and active inflammation and then this area was infiltrated with half percent Marcaine with epinephrine.  The incision was made sharply and then cautery was used to dissect through the dermis and soft tissue, elevating the chronically diseased tissue from the underlying soft tissue and fascia.  There was cicatrix extending into the fascia of the proximal left upper arm and a large (appx 2x5x6cm) chronic granulating cavity extending onto the left lateral breast.  All of this was excised.  On completion the wound was approximately 25 x 20 x 5 cm and there was no appreciable remaining inflammatory tissue or scar.  Hemostasis was obtained with cautery.  The wound was then irrigated and closely inspected for hemostasis which was excellent.  At this point the case is turned over to Dr. Iran Planas for local tissue transfer flap coverage, please see her separately dictated note.

## 2019-07-28 NOTE — Interval H&P Note (Signed)
History and Physical Interval Note:  07/28/2019 7:06 AM  Amanda Davenport  has presented today for surgery, with the diagnosis of HIDRADENITIS.  The various methods of treatment have been discussed with the patient and family. After consideration of risks, benefits and other options for treatment, the patient has consented to  Procedure(s): EXCISION LEFT HIDRADENITIS AXILLA (Left) ADJACENT TISSUE TRANSFER TO LEFT AXILLA GREATER THAN 100 CM SQUARED (Left) as a surgical intervention.  The patient's history has been reviewed, patient examined, no change in status, stable for surgery.  I have reviewed the patient's chart and labs.  Questions were answered to the patient's satisfaction.     Lashaya Kienitz Rich Brave

## 2019-07-28 NOTE — Op Note (Signed)
Operative Note   DATE OF OPERATION: 4.23.21  LOCATION: [facility]-outpatient  SURGICAL DIVISION: Plastic Surgery  PREOPERATIVE DIAGNOSES:  Hidradenitis axillaris  POSTOPERATIVE DIAGNOSES:  same  PROCEDURE:  1. Adjacent tissue transfer trunk 21 x 15 cm 2. Layered closure chest 7 cm  SURGEON: Irene Limbo MD MBA  ASSISTANT: none  ANESTHESIA:  General.   EBL: 150 ml for entire procedure  COMPLICATIONS: None immediate.   INDICATIONS FOR PROCEDURE:  The patient, Amanda Davenport, is a 39 y.o. female born on 1980/06/27, is here for excision left axillary hidradenitis with local flap closure.    FINDINGS: Following excision hidradenitis open wound measuring 25 x 20 cm present.  DESCRIPTION OF PROCEDURE:  The patient's operative site was marked with the patient in the preoperative area. The patient was taken to the operating room. SCDs were placed and IV antibiotics were given. The patient's operative site was prepped and draped in a sterile fashion. A time out was performed and all information was confirmed to be correct. Following excision, layered closure of open wound superior to breast mound completed with 2-0 PDS in superficial fascia, 3-0 monocryl in dermis and 4-0 nylon running suture skin closure, length 7 cm. An inferiorly and posteriorly based rhomboid flap was designedfrom lateral chest wall caudal to defect. The flap was sharply incised and this was carried through superficial fascia. The flap was elevated in subfascial plane until tension free rotation into defect achieved. The wound irrigated and hemostasis ensures. 81 Fr JP placed in base wound and secured with 2-0 nylon. The donor site was closed primarily with 2-0 PDS interrupted in superficial fascia and 3-0 monocryl in dermis. The flap was rotated superiorly and skin trimmed to defect. The flap was inset with 2-0 PDS interrupted in superficial fascia and 3-0 monocryl in dermis. Total area of flap and donor site  closure315cm2.Skin closureof flap and donor site completed with 4-0 nylon interrupted and short running for skin closure.Antibiotic ointment applied followed by dry dressing.  The patient was allowed to wake from anesthesia, extubated and taken to the recovery room in satisfactory condition.   SPECIMENS: none  DRAINS: 19 Fr JP in left axilla

## 2019-07-31 ENCOUNTER — Encounter: Payer: Self-pay | Admitting: *Deleted

## 2019-07-31 LAB — SURGICAL PATHOLOGY

## 2019-07-31 NOTE — Anesthesia Postprocedure Evaluation (Signed)
Anesthesia Post Note  Patient: Amanda Davenport  Procedure(s) Performed: EXCISION LEFT HIDRADENITIS AXILLA (Left ) ADJACENT TISSUE TRANSFER TO LEFT AXILLA GREATER THAN 100 CM SQUARED (Left )     Patient location during evaluation: PACU Anesthesia Type: General Level of consciousness: awake and alert Pain management: pain level controlled Vital Signs Assessment: post-procedure vital signs reviewed and stable Respiratory status: spontaneous breathing, nonlabored ventilation, respiratory function stable and patient connected to nasal cannula oxygen Cardiovascular status: blood pressure returned to baseline and stable Postop Assessment: no apparent nausea or vomiting Anesthetic complications: no    Last Vitals:  Vitals:   07/28/19 1215 07/28/19 1231  BP: 109/67 108/70  Pulse: 91 93  Resp: 20 18  Temp:  36.6 C  SpO2: 96% 96%    Last Pain:  Vitals:   07/28/19 1231  TempSrc: Oral  PainSc: 5                  Kaydra Borgen L Tiea Manninen

## 2019-08-04 ENCOUNTER — Other Ambulatory Visit: Payer: Self-pay | Admitting: Family Medicine

## 2019-08-04 DIAGNOSIS — L732 Hidradenitis suppurativa: Secondary | ICD-10-CM

## 2019-08-04 MED FILL — IBUPROFEN 800 MG TABLET: 800 | 20 days supply | Qty: 60 | Fill #0

## 2019-08-28 ENCOUNTER — Other Ambulatory Visit: Payer: Self-pay | Admitting: Family Medicine

## 2019-08-28 DIAGNOSIS — R110 Nausea: Secondary | ICD-10-CM

## 2019-08-28 MED FILL — GABAPENTIN 300 MG CAPSULE: 300 | 30 days supply | Qty: 90 | Fill #1

## 2019-08-28 MED FILL — ONDANSETRON HCL 4 MG TABLET: 4 | 6 days supply | Qty: 20 | Fill #0

## 2019-09-26 MED FILL — GABAPENTIN 300 MG CAPSULE: 300 | 30 days supply | Qty: 90 | Fill #2

## 2019-10-16 ENCOUNTER — Other Ambulatory Visit: Payer: Self-pay | Admitting: *Deleted

## 2019-10-18 MED FILL — NYSTATIN-TRIAMCINOLONE CRM: 100000-0.1 | 15 days supply | Qty: 30 | Fill #0

## 2019-10-20 MED FILL — ALLOPURINOL 100 MG TABLET: 100 | 30 days supply | Qty: 30 | Fill #4

## 2019-10-20 MED FILL — COLCHICINE 0.6 MG TABS: 0.6 | 10 days supply | Qty: 30 | Fill #0

## 2019-10-20 NOTE — Telephone Encounter (Signed)
Requested medication (s) are due for refill today: yes  Requested medication (s) are on the active medication list: yes  Last refill:  01/06/19  Future visit scheduled: no  Notes to clinic:  Please review for refill. Last filled by historical provider    Requested Prescriptions  Pending Prescriptions Disp Refills   colchicine 0.6 MG tablet [Pharmacy Med Name: COLCHICINE 0.6 MG TABS 0.6 Tablet] 30 tablet 3    Sig: TAKE 1 TABLET (0.6 MG TOTAL) BY MOUTH EVERY 8 (EIGHT) HOURS AS NEEDED (FOR GOUTY ATTACKS).      Endocrinology:  Gout Agents Failed - 10/20/2019 10:17 AM      Failed - Uric Acid in normal range and within 360 days    Uric Acid  Date Value Ref Range Status  04/13/2019 7.0 (H) 2.6 - 6.2 mg/dL Final    Comment:               Therapeutic target for gout patients: <6.0          Passed - Cr in normal range and within 360 days    Creatinine  Date Value Ref Range Status  12/18/2014 0.7 0.6 - 1.1 mg/dL Final   Creat  Date Value Ref Range Status  07/15/2016 0.61 0.50 - 1.10 mg/dL Final   Creatinine, Ser  Date Value Ref Range Status  07/19/2019 0.94 0.44 - 1.00 mg/dL Final          Passed - Valid encounter within last 12 months    Recent Outpatient Visits           3 months ago Hidradenitis suppurativa   [facility], [place], [place]   5 months ago Hidradenitis suppurativa   [facility], [place]   6 months ago Right otitis media, unspecified otitis media type   [facility] [place], [place], [place]

## 2019-10-20 NOTE — Telephone Encounter (Signed)
Pt is calling checking on the status of refill colchicine refill. [facility]

## 2019-10-24 MED FILL — GABAPENTIN 300 MG CAPSULE: 300 | 30 days supply | Qty: 90 | Fill #3

## 2019-10-25 ENCOUNTER — Telehealth: Payer: Self-pay | Admitting: Family Medicine

## 2019-10-25 NOTE — Telephone Encounter (Signed)
Called pt unable to reach/ Left voice message to call back. Name and phone nr provided.

## 2019-10-25 NOTE — Telephone Encounter (Signed)
Copied from [place]. Topic: General - Other >> Oct 24, 2019 11:14 AM Rainey Pines A wrote: Patient would like a callback from nurse to verify that 2 prescriptsions were sent in for patient. I only see that one has been sent. Patient couldn't recall the name of the second medication. Please advise

## 2019-10-27 NOTE — Telephone Encounter (Signed)
Called pt stated she clarify with the pharmacist/

## 2019-11-17 ENCOUNTER — Encounter: Payer: Self-pay | Admitting: Nurse Practitioner

## 2019-11-20 ENCOUNTER — Encounter: Payer: Self-pay | Admitting: Nurse Practitioner

## 2019-11-20 ENCOUNTER — Ambulatory Visit: Payer: Medicare Other | Attending: Nurse Practitioner | Admitting: Nurse Practitioner

## 2019-11-20 ENCOUNTER — Other Ambulatory Visit: Payer: Self-pay

## 2019-11-20 ENCOUNTER — Telehealth: Payer: Self-pay | Admitting: Family Medicine

## 2019-11-20 ENCOUNTER — Other Ambulatory Visit: Payer: Self-pay | Admitting: Nurse Practitioner

## 2019-11-20 DIAGNOSIS — M109 Gout, unspecified: Secondary | ICD-10-CM

## 2019-11-20 DIAGNOSIS — G629 Polyneuropathy, unspecified: Secondary | ICD-10-CM | POA: Diagnosis not present

## 2019-11-20 MED ORDER — ALLOPURINOL 100 MG PO TABS: 100.0000 mg | ORAL_TABLET | Freq: Every day | ORAL | 6 refills | Status: DC

## 2019-11-20 MED ORDER — COLCHICINE 0.6 MG PO TABS: ORAL_TABLET | ORAL | 3 refills | Status: DC

## 2019-11-20 MED ORDER — GABAPENTIN 600 MG PO TABS: 600.0000 mg | ORAL_TABLET | Freq: Three times a day (TID) | ORAL | 3 refills | Status: DC

## 2019-11-20 MED FILL — COLCHICINE 0.6 MG TABS: 0.6 | 10 days supply | Qty: 30 | Fill #0

## 2019-11-20 MED FILL — GABAPENTIN 600 MG TABLET: 600 | 30 days supply | Qty: 90 | Fill #0

## 2019-11-20 MED FILL — ALLOPURINOL 100 MG TABLET: 100 | 30 days supply | Qty: 30 | Fill #0

## 2019-11-20 NOTE — Progress Notes (Signed)
Virtual Visit via Telephone Note Due to national recommendations of social distancing due to [place] 19, telehealth visit is felt to be most appropriate for this patient at this time.  I discussed the limitations, risks, security and privacy concerns of performing an evaluation and management service by telephone and the availability of in person appointments. I also discussed with the patient that there may be a patient responsible charge related to this service. The patient expressed understanding and agreed to proceed.    I connected with Amanda Davenport on 11/20/19  at  11:10 AM EDT  EDT by telephone and verified that I am speaking with the correct person using two identifiers.   Consent I discussed the limitations, risks, security and privacy concerns of performing an evaluation and management service by telephone and the availability of in person appointments. I also discussed with the patient that there may be a patient responsible charge related to this service. The patient expressed understanding and agreed to proceed.   Location of Patient: Private Residence    Location of Provider: [place] [facility] [place] participating in Telemedicine visit: [name] FNP-BC [place] [facility] Child psychotherapist    History of Present Illness: Telemedicine visit for: Gout Flare  Endorses gout flare despite being on allopurinol 100 mg daily. Unfortunately based on pharmacy report she did not fill her allopurinol between April 13 and July 16.  Likely experiencing symptoms of hyperuricemia at this time.  I will refill her allopurinol 100 mg daily and as needed colchicine.  She will return for blood work in 4 weeks.  She also states she was recently approved for disability and is requesting a handicap placard.  I have requested she leave a copy of her disability statement with the front desk for my review and approval.   Past Medical History:  Diagnosis Date   Allergy     Anemia    receives transfusions periodically   Anxiety    Arrhythmia    Chronic headache    Depression    Dysrhythmia    Gout 12/2018   Knee pain    Nearsightedness    wears glasses   Neuropathy    Obesity    Pneumonia    Pre-diabetes    Recurrent boils    WPW (Wolff-Parkinson-White syndrome)     Past Surgical History:  Procedure Laterality Date   ADJACENT TISSUE TRANSFER/TISSUE REARRANGEMENT Left 07/28/2019   Procedure: ADJACENT TISSUE TRANSFER TO LEFT AXILLA GREATER THAN 100 CM SQUARED;  Surgeon: Irene Limbo, MD;  Location: [facility];  Service: Plastics;  Laterality: Left;   HYDRADENITIS EXCISION Left 07/28/2019   Procedure: EXCISION LEFT HIDRADENITIS AXILLA;  Surgeon: Clovis Riley, MD;  Location: [facility];  Service: General;  Laterality: Left;   TONSILLECTOMY      Family History  Problem Relation Age of Onset   Breast cancer Mother    Pulmonary embolism Mother        died of PE   Colon cancer Mother    Irritable bowel syndrome Mother    Cancer Mother    Hypertension Father    Diabetes Paternal Grandmother    Heart disease Neg Hx    Stroke Neg Hx     Social History   Socioeconomic History   Marital status: Single    Spouse name: Not on file   Number of children: Not on file   Years of education: Not on file   Highest education level: Not  on file  Occupational History   Not on file  Tobacco Use   Smoking status: Former Smoker    Years: 0.50    Types: Cigarettes, Cigars    Quit date: 04/30/2019    Years since quitting: 0.5   Smokeless tobacco: Never Used   Tobacco comment: smokes black and milds - last use early-mid August  Vaping Use   Vaping Use: Never used  Substance and Sexual Activity   Alcohol use: No   Drug use: Yes    Types: Marijuana    Comment: 2+ times per month   Sexual activity: Yes    Birth control/protection: None  Other Topics Concern   Not on file  Social History Narrative   Not on  file   Social Determinants of Health   Financial Resource Strain:    Difficulty of Paying Living Expenses:   Food Insecurity:    Worried About Charity fundraiser in the Last Year:    Arboriculturist in the Last Year:   Transportation Needs:    Film/video editor (Medical):    Lack of Transportation (Non-Medical):   Physical Activity:    Days of Exercise per Week:    Minutes of Exercise per Session:   Stress:    Feeling of Stress :   Social Connections:    Frequency of Communication with Friends and Family:    Frequency of Social Gatherings with Friends and Family:    Attends Religious Services:    Active Member of Clubs or Organizations:    Attends Music therapist:    Marital Status:      Observations/Objective: Awake, alert and oriented x 3   Review of Systems  Constitutional: Negative for fever, malaise/fatigue and weight loss.  HENT: Negative.  Negative for nosebleeds.   Eyes: Negative.  Negative for blurred vision, double vision and photophobia.  Respiratory: Negative.  Negative for cough and shortness of breath.   Cardiovascular: Negative.  Negative for chest pain, palpitations and leg swelling.  Gastrointestinal: Negative.  Negative for heartburn, nausea and vomiting.  Musculoskeletal: Negative.  Negative for myalgias.  Neurological: Positive for tingling and sensory change. Negative for dizziness, focal weakness, seizures and headaches.  Psychiatric/Behavioral: Negative.  Negative for suicidal ideas.    Assessment and Plan: Amanda Davenport was seen today for medication management.  Diagnoses and all orders for this visit:  Gout, unspecified cause, unspecified chronicity, unspecified site -     allopurinol (ZYLOPRIM) 100 MG tablet; Take 1 tablet (100 mg total) by mouth daily. -     colchicine 0.6 MG tablet; TAKE 1 TABLET (0.6 MG TOTAL) BY MOUTH EVERY 8 (EIGHT) HOURS AS NEEDED (FOR GOUTY ATTACKS).  Neuropathy -     gabapentin  (NEURONTIN) 600 MG tablet; Take 1 tablet (600 mg total) by mouth 3 (three) times daily.     Follow Up Instructions Return if symptoms worsen or fail to improve.     I discussed the assessment and treatment plan with the patient. The patient was provided an opportunity to ask questions and all were answered. The patient agreed with the plan and demonstrated an understanding of the instructions.   The patient was advised to call back or seek an in-person evaluation if the symptoms worsen or if the condition fails to improve as anticipated.  I provided 17 minutes of non-face-to-face time during this encounter including median intraservice time, reviewing previous notes, labs, imaging, medications and explaining diagnosis and management.  Gildardo Pounds, FNP-BC

## 2019-11-20 NOTE — Telephone Encounter (Signed)
Patient came into the office today and requested to give Amanda Davenport a copy of her Disability benefits letter. Form was placed in pcp box. Please follow up with the patient accordingly.

## 2019-11-21 ENCOUNTER — Ambulatory Visit: Payer: Medicare Other | Attending: Family Medicine

## 2019-11-21 ENCOUNTER — Other Ambulatory Visit: Payer: Self-pay

## 2019-11-22 NOTE — Telephone Encounter (Signed)
Please follow up with patient about paperwork

## 2019-11-22 NOTE — Telephone Encounter (Signed)
Patient calling to check status of this paperwork.

## 2019-11-23 ENCOUNTER — Ambulatory Visit: Payer: Medicare Other | Admitting: Physician Assistant

## 2019-11-23 NOTE — Telephone Encounter (Signed)
Pt. Picked up her handicap placard.

## 2019-12-02 ENCOUNTER — Other Ambulatory Visit: Payer: Self-pay | Admitting: Family Medicine

## 2019-12-02 DIAGNOSIS — L732 Hidradenitis suppurativa: Secondary | ICD-10-CM

## 2019-12-03 NOTE — Telephone Encounter (Signed)
Please fill if appropriate

## 2019-12-04 MED ORDER — IBUPROFEN 800 MG PO TABS: 800.0000 mg | ORAL_TABLET | Freq: Three times a day (TID) | ORAL | 0 refills | Status: DC | PRN

## 2019-12-06 ENCOUNTER — Encounter: Payer: Self-pay | Admitting: Nurse Practitioner

## 2019-12-06 ENCOUNTER — Other Ambulatory Visit: Payer: Self-pay

## 2019-12-06 ENCOUNTER — Ambulatory Visit (INDEPENDENT_AMBULATORY_CARE_PROVIDER_SITE_OTHER): Payer: Medicare Other | Admitting: Nurse Practitioner

## 2019-12-06 VITALS — BP 102/61 | HR 98 | Temp 98.1°F | Ht 69.0 in | Wt 286.0 lb

## 2019-12-06 DIAGNOSIS — Z113 Encounter for screening for infections with a predominantly sexual mode of transmission: Secondary | ICD-10-CM | POA: Diagnosis not present

## 2019-12-06 DIAGNOSIS — M109 Gout, unspecified: Secondary | ICD-10-CM | POA: Diagnosis not present

## 2019-12-06 DIAGNOSIS — R110 Nausea: Secondary | ICD-10-CM | POA: Diagnosis not present

## 2019-12-06 DIAGNOSIS — L732 Hidradenitis suppurativa: Secondary | ICD-10-CM

## 2019-12-06 DIAGNOSIS — H9201 Otalgia, right ear: Secondary | ICD-10-CM

## 2019-12-06 MED ORDER — ONDANSETRON HCL 4 MG PO TABS: 4.0000 mg | ORAL_TABLET | Freq: Three times a day (TID) | ORAL | 0 refills | Status: DC | PRN

## 2019-12-06 MED ORDER — TRIAMCINOLONE ACETONIDE 0.1 % EX CREA: 1.0000 "application " | TOPICAL_CREAM | Freq: Two times a day (BID) | CUTANEOUS | 0 refills | Status: DC

## 2019-12-06 NOTE — Patient Instructions (Addendum)
Urinary Incontinence  Urinary incontinence refers to a condition in which a person is unable to control where and when to pass urine. A person with this condition will urinate when he or she does not mean to (involuntarily). What are the causes? This condition may be caused by:  Medicines.  Infections.  Constipation.  Overactive bladder muscles.  Weak bladder muscles.  Weak pelvic floor muscles. These muscles provide support for the bladder, intestine, and, in women, the uterus.  Enlarged prostate in men. The prostate is a gland near the bladder. When it gets too big, it can pinch the urethra. With the urethra blocked, the bladder can weaken and lose the ability to empty properly.  Surgery.  Emotional factors, such as anxiety, stress, or post-traumatic stress disorder (PTSD).  Pelvic organ prolapse. This happens in women when organs shift out of place and into the vagina. This shift can prevent the bladder and urethra from working properly. What increases the risk? The following factors may make you more likely to develop this condition:  Older age.  Obesity and physical inactivity.  Pregnancy and childbirth.  Menopause.  Diseases that affect the nerves or spinal cord (neurological diseases).  Long-term (chronic) coughing. This can increase pressure on the bladder and pelvic floor muscles. What are the signs or symptoms? Symptoms may vary depending on the type of urinary incontinence you have. They include:  A sudden urge to urinate, but passing urine involuntarily before you can get to a bathroom (urge incontinence).  Suddenly passing urine with any activity that forces urine to pass, such as coughing, laughing, exercise, or sneezing (stress incontinence).  Needing to urinate often, but urinating only a small amount, or constantly dribbling urine (overflow incontinence).  Urinating because you cannot get to the bathroom in time due to a physical disability, such as  arthritis or injury, or communication and thinking problems, such as Alzheimer disease (functional incontinence). How is this diagnosed? This condition may be diagnosed based on:  Your medical history.  A physical exam.  Tests, such as: ? Urine tests. ? X-rays of your kidney and bladder. ? Ultrasound. ? CT scan. ? Cystoscopy. In this procedure, a health care provider inserts a tube with a light and camera (cystoscope) through the urethra and into the bladder in order to check for problems. ? Urodynamic testing. These tests assess how well the bladder, urethra, and sphincter can store and release urine. There are different types of urodynamic tests, and they vary depending on what the test is measuring. To help diagnose your condition, your health care provider may recommend that you keep a log of when you urinate and how much you urinate. How is this treated? Treatment for this condition depends on the type of incontinence that you have and its cause. Treatment may include:  Lifestyle changes, such as: ? Quitting smoking. ? Maintaining a healthy weight. ? Staying active. Try to get 150 minutes of moderate-intensity exercise every week. Ask your health care provider which activities are safe for you. ? Eating a healthy diet.  Avoid high-fat foods, like fried foods.  Avoid refined carbohydrates like white bread and white rice.  Limit how much alcohol and caffeine you drink.  Increase your fiber intake. Foods such as fresh fruits, vegetables, beans, and whole grains are healthy sources of fiber.  Pelvic floor muscle exercises.  Bladder training, such as lengthening the amount of time between bathroom breaks, or using the bathroom at regular intervals.  Using techniques to suppress bladder urges.   This can include distraction techniques or controlled breathing exercises.  Medicines to relax the bladder muscles and prevent bladder spasms.  Medicines to help slow or prevent the  growth of a man's prostate.  Botox injections. These can help relax the bladder muscles.  Using pulses of electricity to help change bladder reflexes (electrical nerve stimulation).  For women, using a medical device to prevent urine leaks. This is a small, tampon-like, disposable device that is inserted into the urethra.  Injecting collagen or carbon beads (bulking agents) into the urinary sphincter. These can help thicken tissue and close the bladder opening.  Surgery. Follow these instructions at home: Lifestyle  Limit alcohol and caffeine. These can fill your bladder quickly and irritate it.  Keep yourself clean to help prevent odors and skin damage. Ask your doctor about special skin creams and cleansers that can protect the skin from urine.  Consider wearing pads or adult diapers. Make sure to change them regularly, and always change them right after experiencing incontinence. General instructions  Take over-the-counter and prescription medicines only as told by your health care provider.  Use the bathroom about every 3-4 hours, even if you do not feel the need to urinate. Try to empty your bladder completely every time. After urinating, wait a minute. Then try to urinate again.  Make sure you are in a relaxed position while urinating.  If your incontinence is caused by nerve problems, keep a log of the medicines you take and the times you go to the bathroom.  Keep all follow-up visits as told by your health care provider. This is important. Contact a health care provider if:  You have pain that gets worse.  Your incontinence gets worse. Get help right away if:  You have a fever or chills.  You are unable to urinate.  You have redness in your groin area or down your legs. Summary  Urinary incontinence refers to a condition in which a person is unable to control where and when to pass urine.  This condition may be caused by medicines, infection, weak bladder  muscles, weak pelvic floor muscles, enlargement of the prostate (in men), or surgery.  The following factors increase your risk for developing this condition: older age, obesity, pregnancy and childbirth, menopause, neurological diseases, and chronic coughing.  There are several types of urinary incontinence. They include urge incontinence, stress incontinence, overflow incontinence, and functional incontinence.  This condition is usually treated first with lifestyle and behavioral changes, such as quitting smoking, eating a healthier diet, and doing regular pelvic floor exercises. Other treatment options include medicines, bulking agents, medical devices, electrical nerve stimulation, or surgery. This information is not intended to replace advice given to you by your health care provider. Make sure you discuss any questions you have with your health care provider. Document Revised: 04/02/2017 Document Reviewed: 07/02/2016 Elsevier Patient Education  [place].   Gout  Gout is painful swelling of your joints. Gout is a type of arthritis. It is caused by having too much uric acid in your body. Uric acid is a chemical that is made when your body breaks down substances called purines. If your body has too much uric acid, sharp crystals can form and build up in your joints. This causes pain and swelling. Gout attacks can happen quickly and be very painful (acute gout). Over time, the attacks can affect more joints and happen more often (chronic gout). What are the causes?  Too much uric acid in your blood.  This can happen because: ? Your kidneys do not remove enough uric acid from your blood. ? Your body makes too much uric acid. ? You eat too many foods that are high in purines. These foods include organ meats, some seafood, and beer.  Trauma or stress. What increases the risk?  Having a family history of gout.  Being female and middle-aged.  Being female and having gone through  menopause.  Being very overweight (obese).  Drinking alcohol, especially beer.  Not having enough water in the body (being dehydrated).  Losing weight too quickly.  Having an organ transplant.  Having lead poisoning.  Taking certain medicines.  Having kidney disease.  Having a skin condition called psoriasis. What are the signs or symptoms? An attack of acute gout usually happens in just one joint. The most common place is the big toe. Attacks often start at night. Other joints that may be affected include joints of the feet, ankle, knee, fingers, wrist, or elbow. Symptoms of an attack may include:  Very bad pain.  Warmth.  Swelling.  Stiffness.  Shiny, red, or purple skin.  Tenderness. The affected joint may be very painful to touch.  Chills and fever. Chronic gout may cause symptoms more often. More joints may be involved. You may also have white or yellow lumps (tophi) on your hands or feet or in other areas near your joints. How is this treated?  Treatment for this condition has two phases: treating an acute attack and preventing future attacks.  Acute gout treatment may include: ? NSAIDs. ? Steroids. These are taken by mouth or injected into a joint. ? Colchicine. This medicine relieves pain and swelling. It can be given by mouth or through an IV tube.  Preventive treatment may include: ? Taking small doses of NSAIDs or colchicine daily. ? Using a medicine that reduces uric acid levels in your blood. ? Making changes to your diet. You may need to see a food expert (dietitian) about what to eat and drink to prevent gout. Follow these instructions at home: During a gout attack   If told, put ice on the painful area: ? Put ice in a plastic bag. ? Place a towel between your skin and the bag. ? Leave the ice on for 20 minutes, 2-3 times a day.  Raise (elevate) the painful joint above the level of your heart as often as you can.  Rest the joint as much as  possible. If the joint is in your leg, you may be given crutches.  Follow instructions from your doctor about what you cannot eat or drink. Avoiding future gout attacks  Eat a low-purine diet. Avoid foods and drinks such as: ? Liver. ? Kidney. ? Anchovies. ? Asparagus. ? Herring. ? Mushrooms. ? Mussels. ? Beer.  Stay at a healthy weight. If you want to lose weight, talk with your doctor. Do not lose weight too fast.  Start or continue an exercise plan as told by your doctor. Eating and drinking  Drink enough fluids to keep your pee (urine) pale yellow.  If you drink alcohol: ? Limit how much you use to:  0-1 drink a day for women.  0-2 drinks a day for men. ? Be aware of how much alcohol is in your drink. In the [place] one drink equals one 12 oz bottle of beer (355 mL), one 5 oz glass of wine (148 mL), or one 1 oz glass of hard liquor (44 mL). General instructions  Take over-the-counter  and prescription medicines only as told by your doctor.  Do not drive or use heavy machinery while taking prescription pain medicine.  Return to your normal activities as told by your doctor. Ask your doctor what activities are safe for you.  Keep all follow-up visits as told by your doctor. This is important. Contact a doctor if:  You have another gout attack.  You still have symptoms of a gout attack after 10 days of treatment.  You have problems (side effects) because of your medicines.  You have chills or a fever.  You have burning pain when you pee (urinate).  You have pain in your lower back or belly. Get help right away if:  You have very bad pain.  Your pain cannot be controlled.  You cannot pee. Summary  Gout is painful swelling of the joints.  The most common site of pain is the big toe, but it can affect other joints.  Medicines and avoiding some foods can help to prevent and treat gout attacks. This information is not intended to replace advice given to you  by your health care provider. Make sure you discuss any questions you have with your health care provider. Document Revised: 10/13/2017 Document Reviewed: 10/13/2017 Elsevier Patient Education  [place].

## 2019-12-06 NOTE — Progress Notes (Signed)
[address] [place], [place]  [address] Phone:  [phone]   Fax:  [phone]   Established Patient Office Visit  Subjective:  Patient ID: Amanda Davenport, female    DOB: 1981-01-18  Age: 39 y.o. MRN: [id number]  CC:  Chief Complaint  Patient presents with   Establish Care    having gout hand pain left hand,   wants STD workup.   Having sharp pain in right ear    HPI TERA Davenport presents for establish care. She has been seen at [facility] . She is just [facility] care. She  has a past medical history of Allergy, Anemia, Anxiety, Arrhythmia, Chronic headache, Depression, Dysrhythmia, Gout (12/2018), Knee pain, Nearsightedness, Neuropathy, Obesity, Pneumonia, Pre-diabetes, Recurrent boils, and WPW (Wolff-Parkinson-White syndrome).   Gout Patient here for evaluation of acute gouty arthritis for 3 weeks. The patient reports 1 attacks involving the left hand middle finger. Attacks occur primarily in the varies. Patient reports her chronic pain is ongoing, her joint stiffness is ongoing and her joint swelling is ongoing. Limitation on activities include none. The patient is avoiding high purine foods and reports consuming 0 alcoholic drinks.  She has been treated with steriod Dosepak.  She has noticed some improvement.  Ear Pain Patient complains of right ear pain. Symptoms have been present several weeks. She also notes Sharp pain mostly at night.  She does have some itching and ringing in her ear. She does not have a history of ear infections. She does not have a history of recent swimming. She has tried no medications  for her symptoms.   Past Medical History:  Diagnosis Date   Allergy    Anemia    receives transfusions periodically   Anxiety    Arrhythmia    Chronic headache    Depression    Dysrhythmia    Gout 12/2018   Knee pain    Nearsightedness    wears glasses   Neuropathy    Obesity    Pneumonia    Pre-diabetes      Recurrent boils    WPW (Wolff-Parkinson-White syndrome)     Past Surgical History:  Procedure Laterality Date   ADJACENT TISSUE TRANSFER/TISSUE REARRANGEMENT Left 07/28/2019   Procedure: ADJACENT TISSUE TRANSFER TO LEFT AXILLA GREATER THAN 100 CM SQUARED;  Surgeon: Irene Limbo, MD;  Location: [facility];  Service: Plastics;  Laterality: Left;   HYDRADENITIS EXCISION Left 07/28/2019   Procedure: EXCISION LEFT HIDRADENITIS AXILLA;  Surgeon: Clovis Riley, MD;  Location: [facility];  Service: General;  Laterality: Left;   TONSILLECTOMY      Family History  Problem Relation Age of Onset   Breast cancer Mother    Pulmonary embolism Mother        died of PE   Colon cancer Mother    Irritable bowel syndrome Mother    Cancer Mother    Hypertension Father    Diabetes Paternal Grandmother    Heart disease Neg Hx    Stroke Neg Hx     Social History   Socioeconomic History   Marital status: Single    Spouse name: Not on file   Number of children: Not on file   Years of education: Not on file   Highest education level: Not on file  Occupational History   Not on file  Tobacco Use   Smoking status: Former Smoker    Years: 0.50    Types: Cigarettes, Cigars  Quit date: 04/30/2019    Years since quitting: 0.6   Smokeless tobacco: Never Used   Tobacco comment: smokes black and milds - last use early-mid August  Vaping Use   Vaping Use: Never used  Substance and Sexual Activity   Alcohol use: No   Drug use: Yes    Types: Marijuana    Comment: 2+ times per month   Sexual activity: Yes    Birth control/protection: None  Other Topics Concern   Not on file  Social History Narrative   Not on file   Social Determinants of Health   Financial Resource Strain:    Difficulty of Paying Living Expenses: Not on file  Food Insecurity:    Worried About Charity fundraiser in the Last Year: Not on file   [employer] of Food in the Last Year: Not on file   Transportation Needs:    Lack of Transportation (Medical): Not on file   Lack of Transportation (Non-Medical): Not on file  Physical Activity:    Days of Exercise per Week: Not on file   Minutes of Exercise per Session: Not on file  Stress:    Feeling of Stress : Not on file  Social Connections:    Frequency of Communication with Friends and Family: Not on file   Frequency of Social Gatherings with Friends and Family: Not on file   Attends Religious Services: Not on file   Active Member of Clubs or Organizations: Not on file   Attends Archivist Meetings: Not on file   Marital Status: Not on file  Intimate Partner Violence:    Fear of Current or Ex-Partner: Not on file   Emotionally Abused: Not on file   Physically Abused: Not on file   Sexually Abused: Not on file    Outpatient Medications Prior to Visit  Medication Sig Dispense Refill   gabapentin (NEURONTIN) 600 MG tablet Take 1 tablet (600 mg total) by mouth 3 (three) times daily. 90 tablet 3   ibuprofen (ADVIL) 800 MG tablet Take 1 tablet (800 mg total) by mouth every 8 (eight) hours as needed for moderate pain. Eat before taking medication 60 tablet 0   mupirocin ointment (BACTROBAN) 2 % Apply to affected skin areas twice daily x5 days then as needed 22 g 3   ondansetron (ZOFRAN) 4 MG tablet TAKE 1 TABLET (4 MG TOTAL) BY MOUTH EVERY 8 (EIGHT) HOURS AS NEEDED FOR NAUSEA OR VOMITING. 20 tablet 0   allopurinol (ZYLOPRIM) 100 MG tablet Take 1 tablet (100 mg total) by mouth daily. (Patient not taking: Reported on 12/06/2019) 30 tablet 6   colchicine 0.6 MG tablet TAKE 1 TABLET (0.6 MG TOTAL) BY MOUTH EVERY 8 (EIGHT) HOURS AS NEEDED (FOR GOUTY ATTACKS). (Patient not taking: Reported on 12/06/2019) 30 tablet 3   No facility-administered medications prior to visit.    Allergies  Allergen Reactions   Other Other (See Comments)    All Antibiotics cause severe vaginal yeast infections   Penicillins  Hives, Itching and Swelling    Has patient had a PCN reaction causing immediate rash, facial/tongue/throat swelling, SOB or lightheadedness with hypotension: Yes Has patient had a PCN reaction causing severe rash involving mucus membranes or skin necrosis: Yes Has patient had a PCN reaction that required hospitalization Yes Has patient had a PCN reaction occurring within the last 10 years: Yes If all of the above answers are "NO", then may proceed with Cephalosporin use.    Shellfish-Derived Products Hives  Shrimp [Shellfish Allergy] Hives   Sulfa Antibiotics Rash    ROS Review of Systems  HENT: Positive for congestion, ear pain and tinnitus. Negative for ear discharge, facial swelling, hearing loss, mouth sores, postnasal drip, rhinorrhea, sinus pressure and sinus pain.   Respiratory: Negative for chest tightness and shortness of breath.   Cardiovascular: Negative for chest pain and leg swelling.  Gastrointestinal: Negative for nausea.       Daily   Genitourinary:       Incontinence  Musculoskeletal: Negative.   Skin: Negative.   Neurological: Positive for dizziness.      Objective:    Physical Exam Constitutional:      General: She is not in acute distress.    Appearance: She is obese.  HENT:     Head: Normocephalic and atraumatic.     Right Ear: Tympanic membrane, ear canal and external ear normal.     Left Ear: Tympanic membrane, ear canal and external ear normal.     Nose: Nose normal.     Mouth/Throat:     Mouth: Mucous membranes are moist.  Cardiovascular:     Rate and Rhythm: Normal rate and regular rhythm.     Pulses: Normal pulses.     Heart sounds: Normal heart sounds.  Pulmonary:     Effort: Pulmonary effort is normal.     Breath sounds: Normal breath sounds.  Abdominal:     Palpations: Abdomen is soft.     Comments: Hypoactive   Musculoskeletal:     Left hand: Swelling and bony tenderness present. Decreased range of motion.     Cervical back:  Normal range of motion.  Skin:    General: Skin is warm and dry.     Capillary Refill: Capillary refill takes less than 2 seconds.  Neurological:     General: No focal deficit present.     Mental Status: She is alert and oriented to person, place, and time.  Psychiatric:        Mood and Affect: Mood normal.        Behavior: Behavior normal.        Thought Content: Thought content normal.        Judgment: Judgment normal.     BP 102/61    Pulse 98    Temp 98.1 F (36.7 C) (Temporal)    Ht 5' 9"  (1.753 m)    Wt 286 lb (129.7 kg)    SpO2 100%    BMI 42.23 kg/m  Wt Readings from Last 3 Encounters:  12/06/19 286 lb (129.7 kg)  07/28/19 287 lb 7.7 oz (130.4 kg)  07/19/19 287 lb 6 oz (130.4 kg)     Health Maintenance Due  Topic Date Due   COVID-19 Vaccine (1) Never done   PAP SMEAR-Modifier  07/16/2019    There are no preventive care reminders to display for this patient.  Lab Results  Component Value Date   TSH 1.990 06/28/2019   Lab Results  Component Value Date   WBC 8.3 12/06/2019   HGB 8.9 (L) 12/06/2019   HCT 31.0 (L) 12/06/2019   MCV 72 (L) 12/06/2019   PLT 666 (H) 12/06/2019   Lab Results  Component Value Date   NA 139 12/06/2019   K 4.3 12/06/2019   CHLORIDE 109 12/18/2014   CO2 25 07/19/2019   GLUCOSE 86 12/06/2019   BUN 12 12/06/2019   CREATININE 0.86 12/06/2019   BILITOT <0.2 12/06/2019   ALKPHOS 85 12/06/2019   AST  13 12/06/2019   ALT 13 03/20/2019   PROT 8.5 12/06/2019   ALBUMIN 3.9 12/06/2019   CALCIUM 9.2 12/06/2019   ANIONGAP 9 07/19/2019   EGFR >90 12/18/2014   No results found for: CHOL No results found for: HDL No results found for: LDLCALC No results found for: TRIG No results found for: CHOLHDL Lab Results  Component Value Date   HGBA1C 5.7 (H) 06/28/2019      Assessment & Plan:   Problem List Items Addressed This Visit      Musculoskeletal and Integument   Hidradenitis suppurativa Continue to follow-up with surgeon as  scheduled and as needed   Relevant Orders   Comp. 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  Encourage Kegel exercises.  Educational material provided      Meds ordered this encounter  Medications   ondansetron (ZOFRAN) 4 MG tablet    Sig: Take 1 tablet (4 mg total) by mouth every 8 (eight) hours as needed for nausea or vomiting.    Dispense:  20 tablet    Refill:  0   triamcinolone cream (KENALOG) 0.1 %    Sig: Apply 1 application topically 2 (two) times daily.    Dispense:  456.6 g    Refill:  0    Order Specific Question:   Supervising Provider    Answer:   Tresa Garter [[id number]]    Follow-up: Return in about 3 months (around 03/06/2020).    Vevelyn Francois, NP

## 2019-12-07 LAB — CBC WITH DIFFERENTIAL/PLATELET
Basophils Absolute: 0.1 10*3/uL (ref 0.0–0.2)
Basos: 1 %
EOS (ABSOLUTE): 0.2 10*3/uL (ref 0.0–0.4)
Eos: 2 %
Hematocrit: 31 % — ABNORMAL LOW (ref 34.0–46.6)
Hemoglobin: 8.9 g/dL — ABNORMAL LOW (ref 11.1–15.9)
Immature Grans (Abs): 0 10*3/uL (ref 0.0–0.1)
Immature Granulocytes: 0 %
Lymphocytes Absolute: 2.1 10*3/uL (ref 0.7–3.1)
Lymphs: 25 %
MCH: 20.7 pg — ABNORMAL LOW (ref 26.6–33.0)
MCHC: 28.7 g/dL — ABNORMAL LOW (ref 31.5–35.7)
MCV: 72 fL — ABNORMAL LOW (ref 79–97)
Monocytes Absolute: 0.4 10*3/uL (ref 0.1–0.9)
Monocytes: 4 %
Neutrophils Absolute: 5.6 10*3/uL (ref 1.4–7.0)
Neutrophils: 68 %
Platelets: 666 10*3/uL — ABNORMAL HIGH (ref 150–450)
RBC: 4.3 x10E6/uL (ref 3.77–5.28)
RDW: 18.1 % — ABNORMAL HIGH (ref 11.7–15.4)
WBC: 8.3 10*3/uL (ref 3.4–10.8)

## 2019-12-07 LAB — COMP. METABOLIC PANEL (12)
AST: 13 IU/L (ref 0–40)
Albumin/Globulin Ratio: 0.8 — ABNORMAL LOW (ref 1.2–2.2)
Albumin: 3.9 g/dL (ref 3.8–4.8)
Alkaline Phosphatase: 85 IU/L (ref 48–121)
BUN/Creatinine Ratio: 14 (ref 9–23)
BUN: 12 mg/dL (ref 6–20)
Bilirubin Total: <0.2 mg/dL (ref 0.0–1.2)
Calcium: 9.2 mg/dL (ref 8.7–10.2)
Chloride: 103 mmol/L (ref 96–106)
Creatinine, Ser: 0.86 mg/dL (ref 0.57–1.00)
GFR calc Af Amer: 99 mL/min/{1.73_m2} (ref >59)
GFR calc non Af Amer: 86 mL/min/{1.73_m2} (ref >59)
Globulin, Total: 4.6 g/dL — ABNORMAL HIGH (ref 1.5–4.5)
Glucose: 86 mg/dL (ref 65–99)
Potassium: 4.3 mmol/L (ref 3.5–5.2)
Sodium: 139 mmol/L (ref 134–144)
Total Protein: 8.5 g/dL (ref 6.0–8.5)

## 2019-12-07 LAB — STD SCREEN (8)
HIV Screen 4th Generation wRfx: NONREACTIVE
HSV 1 Glycoprotein G Ab, IgG: <0.91 index (ref 0.00–0.90)
HSV 2 IgG, Type Spec: 6.34 index — ABNORMAL HIGH (ref 0.00–0.90)
Hep A IgM: NEGATIVE
Hep B C IgM: NEGATIVE
Hep C Virus Ab: 0.7 s/co ratio (ref 0.0–0.9)
Hepatitis B Surface Ag: NEGATIVE
RPR Ser Ql: NONREACTIVE

## 2019-12-07 LAB — ARTHRITIS PANEL
Anti Nuclear Antibody (ANA): NEGATIVE
Rheumatoid fact SerPl-aCnc: <10 IU/mL (ref 0.0–13.9)
Sed Rate: 77 mm/hr — ABNORMAL HIGH (ref 0–32)
Uric Acid: 6.6 mg/dL — ABNORMAL HIGH (ref 2.6–6.2)

## 2019-12-08 ENCOUNTER — Other Ambulatory Visit: Payer: Self-pay | Admitting: Nurse Practitioner

## 2019-12-08 ENCOUNTER — Encounter: Payer: Self-pay | Admitting: Nurse Practitioner

## 2019-12-09 LAB — CHLAMYDIA/GONOCOCCUS/TRICHOMONAS, NAA
Chlamydia by NAA: NEGATIVE
Gonococcus by NAA: NEGATIVE
Trich vag by NAA: NEGATIVE

## 2019-12-14 ENCOUNTER — Telehealth: Payer: Self-pay

## 2019-12-14 NOTE — Telephone Encounter (Signed)
Okay can you please let Amanda Davenport know that we may need to shift some of her medicines around.  Possibly changing the ibuprofen to indomethacin may be a little more effective.  If she would like to try another steroid Dosepak this may also be a possibility.  Please let me know when she would like. She needs to start on the iron supplements to help correct her iron deficiency anemia.  An over-the-counter iron supplement will be effective.  Thank you

## 2019-12-14 NOTE — Telephone Encounter (Signed)
Patient said that she is still having problems with her left hand, left elbow, and right ankle.  She stated that it wakes her up at night

## 2019-12-25 ENCOUNTER — Telehealth: Payer: Self-pay

## 2019-12-25 ENCOUNTER — Other Ambulatory Visit: Payer: Self-pay | Admitting: Nurse Practitioner

## 2019-12-25 MED ORDER — INDOMETHACIN 25 MG PO CAPS: 25.0000 mg | ORAL_CAPSULE | Freq: Three times a day (TID) | ORAL | 0 refills | Status: DC | PRN

## 2019-12-25 MED ORDER — MELOXICAM 15 MG PO TABS: 15.0000 mg | ORAL_TABLET | Freq: Every day | ORAL | 0 refills | Status: DC

## 2019-12-25 NOTE — Telephone Encounter (Signed)
Make [name] aware that she needs iron supplement.  She does not need any type of infusion unless something has changed.  She can come back in for additional labs if she would like but she does not need a blood transfusion or iron infusion at this moment.  The indomethacin has been sent.

## 2019-12-25 NOTE — Telephone Encounter (Signed)
Called left voicemail to call office, if still experiencing problems

## 2019-12-25 NOTE — Telephone Encounter (Signed)
Patient wants to start an iron infusion or a blood transfusion. Please send medication Indomethacin to pharmacy

## 2019-12-26 NOTE — Telephone Encounter (Signed)
Called and discussed message with patient

## 2019-12-28 ENCOUNTER — Other Ambulatory Visit: Payer: Self-pay | Admitting: Nurse Practitioner

## 2019-12-28 DIAGNOSIS — G629 Polyneuropathy, unspecified: Secondary | ICD-10-CM

## 2019-12-28 MED ORDER — GABAPENTIN 600 MG PO TABS: 600.0000 mg | ORAL_TABLET | Freq: Three times a day (TID) | ORAL | 11 refills | Status: DC

## 2019-12-28 NOTE — Telephone Encounter (Signed)
Will forward to pcp

## 2019-12-29 ENCOUNTER — Telehealth: Payer: Self-pay

## 2019-12-29 NOTE — Telephone Encounter (Signed)
I changed it to Meloxicam. She can buy [employer] OTC.

## 2019-12-29 NOTE — Telephone Encounter (Signed)
Insurance doesn't cover Indomethacin 25mg  capsules, but insurance does cover Ibuprofen.  Please switch so insurance can pay for medication

## 2020-01-04 NOTE — Telephone Encounter (Signed)
LVM on 12/29/2019 informing patient that prescription has been sent to pharmacy

## 2020-01-11 ENCOUNTER — Encounter: Payer: Self-pay | Admitting: Nurse Practitioner

## 2020-01-11 ENCOUNTER — Ambulatory Visit (INDEPENDENT_AMBULATORY_CARE_PROVIDER_SITE_OTHER): Payer: Medicare Other | Admitting: Nurse Practitioner

## 2020-01-11 ENCOUNTER — Other Ambulatory Visit: Payer: Self-pay

## 2020-01-11 VITALS — BP 137/77 | HR 80 | Temp 97.4°F | Ht 69.0 in | Wt 282.0 lb

## 2020-01-11 DIAGNOSIS — M109 Gout, unspecified: Secondary | ICD-10-CM | POA: Diagnosis not present

## 2020-01-11 DIAGNOSIS — R7989 Other specified abnormal findings of blood chemistry: Secondary | ICD-10-CM

## 2020-01-11 DIAGNOSIS — Z130 Encounter for screening for diseases of the blood and blood-forming organs and certain disorders involving the immune mechanism: Secondary | ICD-10-CM | POA: Diagnosis not present

## 2020-01-11 MED ORDER — METHYLPREDNISOLONE 4 MG PO TBPK: ORAL_TABLET | ORAL | 0 refills | Status: AC

## 2020-01-11 NOTE — Progress Notes (Signed)
[facility] [place], [place]  [address] Phone:  [phone]   Fax:  [phone]   Acute Office Visit  Subjective:    Patient ID: Amanda Davenport, female    DOB: 08-15-1980, 39 y.o.   MRN: [id number]  Chief Complaint  Patient presents with  . Follow-up    having gout attack in left arm, haven't been taking gout medication    HPI Patient is in today for follow up. She  has a past medical history of Allergy, Anemia, Anxiety, Arrhythmia, Chronic headache, Depression, Dysrhythmia, Gout (12/2018), Knee pain, Nearsightedness, Neuropathy, Obesity, Pneumonia, Pre-diabetes, Recurrent boils, and WPW (Wolff-Parkinson-White syndrome).   Gout Patient here for evaluation of acute gouty arthritis. The patient reports onset of an acute gout attack involving the left wrist beginning 10 days, and being treated with prescription NSAIDs colchicine. Attacks occur primarily in the left first MTP joint. Patient reports her chronic pain is ongoing, her joint stiffness is worse and her joint swelling is worse. Limitation on activities include  difficulty moving her hand having to move it passively. The patient is not avoiding high purine foods and reports consuming 0 alcoholic drinks.  Anemia Patient presents for evaluation of anemia. Anemia was found by routine CBC. It has been present for several months. Associated signs & symptoms: none. LMP 9/20-9/23   Past Medical History:  Diagnosis Date  . Allergy   . Anemia    receives transfusions periodically  . Anxiety   . Arrhythmia   . Chronic headache   . Depression   . Dysrhythmia   . Gout 12/2018  . Knee pain   . Nearsightedness    wears glasses  . Neuropathy   . Obesity   . Pneumonia   . Pre-diabetes   . Recurrent boils   . WPW (Wolff-Parkinson-White syndrome)     Past Surgical History:  Procedure Laterality Date  . ADJACENT TISSUE TRANSFER/TISSUE REARRANGEMENT Left 07/28/2019   Procedure: ADJACENT  TISSUE TRANSFER TO LEFT AXILLA GREATER THAN 100 CM SQUARED;  Surgeon: Irene Limbo, MD;  Location: [facility];  Service: Plastics;  Laterality: Left;  . HYDRADENITIS EXCISION Left 07/28/2019   Procedure: EXCISION LEFT HIDRADENITIS AXILLA;  Surgeon: Clovis Riley, MD;  Location: [facility];  Service: General;  Laterality: Left;  . TONSILLECTOMY      Family History  Problem Relation Age of Onset  . Breast cancer Mother   . Pulmonary embolism Mother        died of PE  . Colon cancer Mother   . Irritable bowel syndrome Mother   . Cancer Mother   . Hypertension Father   . Diabetes Paternal Grandmother   . Heart disease Neg Hx   . Stroke Neg Hx     Social History   Socioeconomic History  . Marital status: Single    Spouse name: Not on file  . Number of children: Not on file  . Years of education: Not on file  . Highest education level: Not on file  Occupational History  . Not on file  Tobacco Use  . Smoking status: Former Smoker    Years: 0.50    Types: Cigarettes, Cigars    Quit date: 04/30/2019    Years since quitting: 0.7  . Smokeless tobacco: Never Used  . Tobacco comment: smokes black and milds - last use early-mid August  Vaping Use  . Vaping Use: Never used  Substance and Sexual Activity  . Alcohol use: No  .  Drug use: Yes    Types: Marijuana    Comment: 2+ times per month  . Sexual activity: Yes    Birth control/protection: None  Other Topics Concern  . Not on file  Social History Narrative  . Not on file   Social Determinants of Health   Financial Resource Strain:   . Difficulty of Paying Living Expenses: Not on file  Food Insecurity:   . Worried About Charity fundraiser in the Last Year: Not on file  . Ran Out of Food in the Last Year: Not on file  Transportation Needs:   . Lack of Transportation (Medical): Not on file  . Lack of Transportation (Non-Medical): Not on file  Physical Activity:   . Days of Exercise per Week: Not on file  . Minutes of  Exercise per Session: Not on file  Stress:   . Feeling of Stress : Not on file  Social Connections:   . Frequency of Communication with Friends and Family: Not on file  . Frequency of Social Gatherings with Friends and Family: Not on file  . Attends Religious Services: Not on file  . Active Member of Clubs or Organizations: Not on file  . Attends Archivist Meetings: Not on file  . Marital Status: Not on file  Intimate Partner Violence:   . Fear of Current or Ex-Partner: Not on file  . Emotionally Abused: Not on file  . Physically Abused: Not on file  . Sexually Abused: Not on file    Outpatient Medications Prior to Visit  Medication Sig Dispense Refill  . gabapentin (NEURONTIN) 600 MG tablet Take 1 tablet (600 mg total) by mouth 3 (three) times daily. 90 tablet 11  . ibuprofen (ADVIL) 800 MG tablet Take 1 tablet (800 mg total) by mouth every 8 (eight) hours as needed for moderate pain. Eat before taking medication 60 tablet 0  . meloxicam (MOBIC) 15 MG tablet Take 1 tablet (15 mg total) by mouth daily. 30 tablet 0  . mupirocin ointment (BACTROBAN) 2 % Apply to affected skin areas twice daily x5 days then as needed 22 g 3  . ondansetron (ZOFRAN) 4 MG tablet Take 1 tablet (4 mg total) by mouth every 8 (eight) hours as needed for nausea or vomiting. 20 tablet 0  . triamcinolone cream (KENALOG) 0.1 % Apply 1 application topically 2 (two) times daily. 456.6 g 0  . allopurinol (ZYLOPRIM) 100 MG tablet Take 1 tablet (100 mg total) by mouth daily. (Patient not taking: Reported on 12/06/2019) 30 tablet 6  . colchicine 0.6 MG tablet TAKE 1 TABLET (0.6 MG TOTAL) BY MOUTH EVERY 8 (EIGHT) HOURS AS NEEDED (FOR GOUTY ATTACKS). (Patient not taking: Reported on 12/06/2019) 30 tablet 3   No facility-administered medications prior to visit.    Allergies  Allergen Reactions  . Other Other (See Comments)    All Antibiotics cause severe vaginal yeast infections  . Penicillins Hives, Itching,  Swelling and Rash    Has patient had a PCN reaction causing immediate rash, facial/tongue/throat swelling, SOB or lightheadedness with hypotension: Yes Has patient had a PCN reaction causing severe rash involving mucus membranes or skin necrosis: Yes Has patient had a PCN reaction that required hospitalization Yes Has patient had a PCN reaction occurring within the last 10 years: Yes If all of the above answers are "NO", then may proceed with Cephalosporin use.  Has patient had a PCN reaction causing immediate rash, facial/tongue/throat swelling, SOB or lightheadedness with hypotension: Yes  Has patient had a PCN reaction causing severe rash involving mucus membranes or skin necrosis: Yes Has patient had a PCN reaction that required hospitalization Yes Has patient had a PCN reaction occurring within the last 10 years: Yes If all of the above answers are "NO", then may proceed with Cephalosporin use. Has patient had a PCN reaction causing immediate rash, facial/tongue/throat swelling, SOB or lightheadedness with hypotension: Yes Has patient had a PCN reaction causing severe rash involving mucus membranes or skin necrosis: Yes Has patient had a PCN reaction that required hospitalization Yes Has patient had a PCN reaction occurring within the last 10 years: Yes If all of the above answers are "NO", then may proceed with Cephalosporin use.  . Shellfish-Derived Products Hives  . Shrimp [Shellfish Allergy] Hives  . Sulfa Antibiotics Rash    Review of Systems     Objective:    Physical Exam Constitutional:      Appearance: She is obese. She is not ill-appearing, toxic-appearing or diaphoretic.  Cardiovascular:     Rate and Rhythm: Normal rate.     Pulses: Normal pulses.  Musculoskeletal:        General: Swelling present.     Left wrist: Swelling and tenderness present. Decreased range of motion.  Skin:    General: Skin is warm.     Capillary Refill: Capillary refill takes less than 2  seconds.     Findings: Erythema present.  Neurological:     General: No focal deficit present.     Mental Status: She is alert and oriented to person, place, and time.  Psychiatric:        Mood and Affect: Mood normal.        Behavior: Behavior normal.        Thought Content: Thought content normal.        Judgment: Judgment normal.     BP 137/77   Pulse 80   Temp (!) 97.4 F (36.3 C) (Temporal)   Ht 5' 9"  (1.753 m)   Wt 282 lb (127.9 kg)   SpO2 98%   BMI 41.64 kg/m  Wt Readings from Last 3 Encounters:  01/11/20 282 lb (127.9 kg)  12/06/19 286 lb (129.7 kg)  07/28/19 287 lb 7.7 oz (130.4 kg)    There are no preventive care reminders to display for this patient.  There are no preventive care reminders to display for this patient.   Lab Results  Component Value Date   TSH 1.990 06/28/2019   Lab Results  Component Value Date   WBC 8.3 01/11/2020   HGB 8.7 (L) 01/11/2020   HCT 31.9 (L) 01/11/2020   MCV 71 (L) 01/11/2020   PLT 639 (H) 01/11/2020   Lab Results  Component Value Date   NA 139 12/06/2019   K 4.3 12/06/2019   CHLORIDE 109 12/18/2014   CO2 25 07/19/2019   GLUCOSE 86 12/06/2019   BUN 12 12/06/2019   CREATININE 0.86 12/06/2019   BILITOT <0.2 12/06/2019   ALKPHOS 85 12/06/2019   AST 13 12/06/2019   ALT 13 03/20/2019   PROT 8.5 12/06/2019   ALBUMIN 3.9 12/06/2019   CALCIUM 9.2 12/06/2019   ANIONGAP 9 07/19/2019   EGFR >90 12/18/2014   No results found for: CHOL No results found for: HDL No results found for: LDLCALC No results found for: TRIG No results found for: [facility] Lab Results  Component Value Date   HGBA1C 5.7 (H) 06/28/2019       Assessment & Plan:  Problem List Items Addressed This Visit    None    Visit Diagnoses    Acute gout of left wrist, unspecified cause    -  Primary Recurrence of chronic gout May consider Uloric   Relevant Medications   methylPREDNISolone (MEDROL) 4 MG TBPK tablet   Other Relevant Orders    Uric acid (Completed)   Elevated platelet count       Relevant Orders   CBC with Differential/Platelet (Completed)   Ambulatory referral to Hematology   Screening for iron deficiency anemia       Relevant Orders   CBC with Differential/Platelet (Completed)       Meds ordered this encounter  Medications  . methylPREDNISolone (MEDROL) 4 MG TBPK tablet    Sig: Follow package instructions.    Dispense:  21 tablet    Refill:  0    Do not add to the "Automatic Refill" notification system.    Order Specific Question:   Supervising Provider    Answer:   Tresa Garter [[id number]]     Vevelyn Francois, NP

## 2020-01-12 ENCOUNTER — Telehealth: Payer: Self-pay | Admitting: Hematology

## 2020-01-12 ENCOUNTER — Encounter: Payer: Self-pay | Admitting: Nurse Practitioner

## 2020-01-12 LAB — CBC WITH DIFFERENTIAL/PLATELET
Basophils Absolute: 0.1 10*3/uL (ref 0.0–0.2)
Basos: 1 %
EOS (ABSOLUTE): 0.2 10*3/uL (ref 0.0–0.4)
Eos: 3 %
Hematocrit: 31.9 % — ABNORMAL LOW (ref 34.0–46.6)
Hemoglobin: 8.7 g/dL — ABNORMAL LOW (ref 11.1–15.9)
Immature Grans (Abs): 0 10*3/uL (ref 0.0–0.1)
Immature Granulocytes: 0 %
Lymphocytes Absolute: 2 10*3/uL (ref 0.7–3.1)
Lymphs: 23 %
MCH: 19.5 pg — ABNORMAL LOW (ref 26.6–33.0)
MCHC: 27.3 g/dL — ABNORMAL LOW (ref 31.5–35.7)
MCV: 71 fL — ABNORMAL LOW (ref 79–97)
Monocytes Absolute: 0.4 10*3/uL (ref 0.1–0.9)
Monocytes: 5 %
Neutrophils Absolute: 5.7 10*3/uL (ref 1.4–7.0)
Neutrophils: 68 %
Platelets: 639 10*3/uL — ABNORMAL HIGH (ref 150–450)
RBC: 4.47 x10E6/uL (ref 3.77–5.28)
RDW: 18.6 % — ABNORMAL HIGH (ref 11.7–15.4)
WBC: 8.3 10*3/uL (ref 3.4–10.8)

## 2020-01-12 LAB — URIC ACID: Uric Acid: 5.9 mg/dL (ref 2.6–6.2)

## 2020-01-12 NOTE — Telephone Encounter (Signed)
Received a new hem referral from the [facility] for elevated platelets. Amanda Davenport has been cld and scheduled to see Dr. Irene Limbo on 10/27 at 11am. Pt aware to arrive 20 minutes early.

## 2020-01-16 ENCOUNTER — Other Ambulatory Visit: Payer: Self-pay | Admitting: Family Medicine

## 2020-01-16 DIAGNOSIS — L732 Hidradenitis suppurativa: Secondary | ICD-10-CM

## 2020-01-16 NOTE — Telephone Encounter (Signed)
Requested Prescriptions  Pending Prescriptions Disp Refills   ibuprofen (ADVIL) 800 MG tablet [Pharmacy Med Name: IBUPROFEN 800MG  TABLETS] 60 tablet 0    Sig: TAKE 1 TABLET BY MOUTH EVERY 8 HOURS AS NEEDED FOR MODERATE PAIN. EAT BEFORE TAKING MEDICATION     Analgesics:  NSAIDS Failed - 01/16/2020  1:06 PM      Failed - HGB in normal range and within 360 days    Hemoglobin  Date Value Ref Range Status  01/11/2020 8.7 (L) 11.1 - 15.9 g/dL Final   HGB  Date Value Ref Range Status  12/18/2014 9.5 (L) 11.6 - 15.9 g/dL Final         Passed - Cr in normal range and within 360 days    Creatinine  Date Value Ref Range Status  12/18/2014 0.7 0.6 - 1.1 mg/dL Final   Creat  Date Value Ref Range Status  07/15/2016 0.61 0.50 - 1.10 mg/dL Final   Creatinine, Ser  Date Value Ref Range Status  12/06/2019 0.86 0.57 - 1.00 mg/dL Final         Passed - Patient is not pregnant      Passed - Valid encounter within last 12 months    Recent Outpatient Visits          1 month ago Gout, unspecified cause, unspecified chronicity, unspecified site   [place], Vernia Buff, NP   6 months ago Hidradenitis suppurativa   [facility], [place], MD   8 months ago Hidradenitis suppurativa   [facility], [place], MD   9 months ago Right otitis media, unspecified otitis media type   [facility], [place], MD

## 2020-01-17 ENCOUNTER — Ambulatory Visit: Payer: Medicare Other | Admitting: Nurse Practitioner

## 2020-01-18 ENCOUNTER — Other Ambulatory Visit: Payer: Self-pay | Admitting: Nurse Practitioner

## 2020-01-18 MED ORDER — FEBUXOSTAT 40 MG PO TABS: 80.0000 mg | ORAL_TABLET | Freq: Every day | ORAL | 2 refills | Status: DC

## 2020-01-19 ENCOUNTER — Other Ambulatory Visit: Payer: Self-pay | Admitting: Nurse Practitioner

## 2020-01-19 DIAGNOSIS — D649 Anemia, unspecified: Secondary | ICD-10-CM

## 2020-01-26 ENCOUNTER — Other Ambulatory Visit: Payer: Self-pay | Admitting: Nurse Practitioner

## 2020-01-28 NOTE — Progress Notes (Signed)
HEMATOLOGY/ONCOLOGY CONSULTATION NOTE  Date of Service: .01/31/2020   PCP - Dionisio David (Sickle cell center)/Cammie Fulp [facility] (Gout management) Gennette Pac MD Cardiology - Cristopher Peru  CHIEF COMPLAINTS/PURPOSE OF CONSULTATION:  Thrombocytosis Severe iron deficiency anemia  HISTORY OF PRESENTING ILLNESS:   Amanda Davenport is a wonderful 39 y.o. female who has been referred to [place] for evaluation and management of thrombocytosis.  She was last seen by [place] in 2016 and was lost to follow-up due to issues with her medical insurance.  Patient has been referred back to [place] for evaluation of her intermittent thrombocytosis and severe iron deficiency anemia. The pt reports her hidradenitis suppurativa has been quite bothersome and she had surgery on 4/23-- left HS excision--still has an open surgical wound with some oozing .  Had blood loss during surgery as well.  Patient notes she has had significant issues with severe gout attacks- prednisone, allopurinol/colchicine/febostat.  She is following with Dr.Khiem Gardiner Sleeper MD for her rheumatology cares.  She continues to have heavy menstrual periods lasting 5 days of which 4 are very heavy. Has been taking Geritol  1 month and also has continued taking prenatal vitamins No personal history of VTE No FHx of VTE, bleeding or clotting disorders but mother had PE in the setting of surgery and active breast cancer. Quit smoking tobacco- 07/2019. 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  On review of systems, pt reports severe fatigue and some ice cravings.  No fevers chills night sweats or new bone pains.  MEDICAL HISTORY:  Past Medical History:  Diagnosis Date  .  Allergy   . Anemia    receives transfusions periodically  . Anxiety   . Arrhythmia   . Chronic headache   . Depression   . Dysrhythmia   . Gout 12/2018  . Knee pain   . Nearsightedness    wears glasses  . Neuropathy   . Obesity   . Pneumonia   . Pre-diabetes   . Recurrent boils   . WPW (Wolff-Parkinson-White syndrome)     SURGICAL HISTORY: Past Surgical History:  Procedure Laterality Date  . ADJACENT TISSUE TRANSFER/TISSUE REARRANGEMENT Left 07/28/2019   Procedure: ADJACENT TISSUE TRANSFER TO LEFT AXILLA GREATER THAN 100 CM SQUARED;  Surgeon: Irene Limbo, MD;  Location: [facility];  Service: Plastics;  Laterality: Left;  . HYDRADENITIS EXCISION Left 07/28/2019   Procedure: EXCISION LEFT HIDRADENITIS AXILLA;  Surgeon: Clovis Riley, MD;  Location: [facility];  Service: General;  Laterality: Left;  . TONSILLECTOMY      SOCIAL HISTORY: Social History   Socioeconomic History  . Marital status: Single    Spouse name: Not on file  . Number of children: Not on file  . Years of education: Not on file  . Highest education level: Not on file  Occupational History  . Not on file  Tobacco Use  . Smoking status: Former Smoker    Years: 0.50    Types: Cigarettes, Cigars    Quit date: 04/30/2019    Years since quitting: 0.7  . Smokeless tobacco: Never Used  . Tobacco comment: smokes black and milds - last use early-mid August  Vaping  Use  . Vaping Use: Never used  Substance and Sexual Activity  . Alcohol use: No  . Drug use: Yes    Types: Marijuana    Comment: 2+ times per month  . Sexual activity: Yes    Birth control/protection: None  Other Topics Concern  . Not on file  Social History Narrative  . Not on file   Social Determinants of Health   Financial Resource Strain:   . Difficulty of Paying Living Expenses: Not on file  Food Insecurity:   . Worried About Charity fundraiser in the Last Year: Not on file  . Ran Out of Food in the Last Year: Not on file   Transportation Needs:   . Lack of Transportation (Medical): Not on file  . Lack of Transportation (Non-Medical): Not on file  Physical Activity:   . Days of Exercise per Week: Not on file  . Minutes of Exercise per Session: Not on file  Stress:   . Feeling of Stress : Not on file  Social Connections:   . Frequency of Communication with Friends and Family: Not on file  . Frequency of Social Gatherings with Friends and Family: Not on file  . Attends Religious Services: Not on file  . Active Member of Clubs or Organizations: Not on file  . Attends Archivist Meetings: Not on file  . Marital Status: Not on file  Intimate Partner Violence:   . Fear of Current or Ex-Partner: Not on file  . Emotionally Abused: Not on file  . Physically Abused: Not on file  . Sexually Abused: Not on file    FAMILY HISTORY: Family History  Problem Relation Age of Onset  . Breast cancer Mother   . Pulmonary embolism Mother        died of PE  . Colon cancer Mother   . Irritable bowel syndrome Mother   . Cancer Mother   . Hypertension Father   . Diabetes Paternal Grandmother   . Heart disease Neg Hx   . Stroke Neg Hx     ALLERGIES:  is allergic to other, penicillins, shellfish-derived products, shrimp [shellfish allergy], and sulfa antibiotics.  MEDICATIONS:  Current Outpatient Medications  Medication Sig Dispense Refill  . colchicine 0.6 MG tablet TAKE 1 TABLET (0.6 MG TOTAL) BY MOUTH EVERY 8 (EIGHT) HOURS AS NEEDED (FOR GOUTY ATTACKS). (Patient not taking: Reported on 12/06/2019) 30 tablet 3  . febuxostat (ULORIC) 40 MG tablet Take 2 tablets (80 mg total) by mouth daily. 60 tablet 2  . gabapentin (NEURONTIN) 600 MG tablet Take 1 tablet (600 mg total) by mouth 3 (three) times daily. 90 tablet 11  . ibuprofen (ADVIL) 800 MG tablet TAKE 1 TABLET BY MOUTH EVERY 8 HOURS AS NEEDED FOR MODERATE PAIN. EAT BEFORE TAKING MEDICATION 60 tablet 0  . meloxicam (MOBIC) 15 MG tablet Take 1 tablet (15 mg  total) by mouth daily. 30 tablet 0  . mupirocin ointment (BACTROBAN) 2 % Apply to affected skin areas twice daily x5 days then as needed 22 g 3  . ondansetron (ZOFRAN) 4 MG tablet Take 1 tablet (4 mg total) by mouth every 8 (eight) hours as needed for nausea or vomiting. 20 tablet 0  . triamcinolone cream (KENALOG) 0.1 % Apply 1 application topically 2 (two) times daily. 456.6 g 0   No current facility-administered medications for this visit.    REVIEW OF SYSTEMS:    10 Point review of Systems was done is negative except as  noted above.  PHYSICAL EXAMINATION: ECOG PERFORMANCE STATUS: 2 - Symptomatic, <50% confined to bed  . Vitals:   01/31/20 1126  BP: 104/72  Pulse: 88  Resp: 18  Temp: (!) 97.4 F (36.3 C)  SpO2: 99%   Filed Weights   01/31/20 1126  Weight: 284 lb 11.2 oz (129.1 kg)   .Body mass index is 42.04 kg/m.  NAD GENERAL:alert, in no acute distress and comfortable SKIN: no acute rashes, no significant lesions EYES: conjunctiva are pink and non-injected, sclera anicteric OROPHARYNX: MMM, no exudates, no oropharyngeal erythema or ulceration NECK: supple, no JVD LYMPH:  no palpable lymphadenopathy in the cervical, axillary or inguinal regions LUNGS: clear to auscultation b/l with normal respiratory effort HEART: regular rate & rhythm ABDOMEN:  normoactive bowel sounds , non tender, not distended. Extremity: no pedal edema PSYCH: alert & oriented x 3 with fluent speech NEURO: no focal motor/sensory deficits  LABORATORY DATA:  I have reviewed the data as listed  . CBC Latest Ref Rng & Units 01/31/2020 01/11/2020 12/06/2019  WBC 4.0 - 10.5 K/uL 14.2(H) 8.3 8.3  Hemoglobin 12.0 - 15.0 g/dL 9.0(L) 8.7(L) 8.9(L)  Hematocrit 36 - 46 % 30.3(L) 31.9(L) 31.0(L)  Platelets 150 - 400 K/uL 714(H) 639(H) 666(H)   . CBC    Component Value Date/Time   WBC 14.2 (H) 01/31/2020 1241   RBC 4.46 01/31/2020 1241   HGB 9.0 (L) 01/31/2020 1241   HGB 8.7 (L) 01/11/2020 1015    HGB 9.5 (L) 12/18/2014 1518   HCT 30.3 (L) 01/31/2020 1241   HCT 31.9 (L) 01/11/2020 1015   HCT 31.4 (L) 12/18/2014 1518   PLT 714 (H) 01/31/2020 1241   PLT 639 (H) 01/11/2020 1015   MCV 67.9 (L) 01/31/2020 1241   MCV 71 (L) 01/11/2020 1015   MCV 67.0 (L) 12/18/2014 1518   MCH 20.2 (L) 01/31/2020 1241   MCHC 29.7 (L) 01/31/2020 1241   RDW 19.8 (H) 01/31/2020 1241   RDW 18.6 (H) 01/11/2020 1015   RDW 26.0 (H) 12/18/2014 1518   LYMPHSABS 6.5 (H) 01/31/2020 1241   LYMPHSABS 2.0 01/11/2020 1015   LYMPHSABS 1.6 12/18/2014 1518   MONOABS 0.6 01/31/2020 1241   MONOABS 0.3 12/18/2014 1518   EOSABS 0.1 01/31/2020 1241   EOSABS 0.2 01/11/2020 1015   BASOSABS 0.1 01/31/2020 1241   BASOSABS 0.1 01/11/2020 1015   BASOSABS 0.0 12/18/2014 1518    . CMP Latest Ref Rng & Units 01/31/2020 12/06/2019 07/19/2019  Glucose 70 - 99 mg/dL 81 86 118(H)  BUN 6 - 20 mg/dL 11 12 9   Creatinine 0.44 - 1.00 mg/dL 0.73 0.86 0.94  Sodium 135 - 145 mmol/L 135 139 141  Potassium 3.5 - 5.1 mmol/L 3.5 4.3 3.6  Chloride 98 - 111 mmol/L 104 103 107  CO2 22 - 32 mmol/L 27 - 25  Calcium 8.9 - 10.3 mg/dL 8.7(L) 9.2 8.9  Total Protein 6.5 - 8.1 g/dL 7.7 8.5 -  Total Bilirubin 0.3 - 1.2 mg/dL 0.3 <0.2 -  Alkaline Phos 38 - 126 U/L 68 85 -  AST 15 - 41 U/L 11(L) 13 -  ALT 0 - 44 U/L 13 - -   . Lab Results  Component Value Date   IRON 14 (L) 01/31/2020   TIBC 354 01/31/2020   IRONPCTSAT 4 (L) 01/31/2020   (Iron and TIBC)  Lab Results  Component Value Date   FERRITIN 5 (L) 01/31/2020   B12 ---192    RADIOGRAPHIC STUDIES: I have personally  reviewed the radiological images as listed and agreed with the findings in the report. No results found.  ASSESSMENT & PLAN:   39 year old African American female with  #1 Microcytic anemia - likely due to severe iron deficiency.  Unclear etiology possibly chronic hemorrhoidal bleeding versus heavy periods versus other GI losses (?NSAIDS associated  ulceration). Additional  Elements causing anemia include chronic inflammatory state due to hydradenitis suppurativa and severe gout. Given the relatively high RBC numbers for the given hemoglobin cannot rule out underlying thalassemia/hemoglobinopathy. Plan -Labs were done today and showed severe iron deficiency anemia not responsive to oral iron at this time. -Patient is significantly symptomatic and would recommend replacing iron aggressively IV in the setting of ongoing heavy menstrual losses and an open surgical wound. -IV Injectafer 750 mg weekly x2 doses ordered.  #2 thrombocytosis This is likely reactive from severe iron deficiency anemia, surgery, previous smoking, inflammation from her hidradenitis suppurativa and gout. Given her age unlikely to be a myeloproliferative neoplasm like essential thrombocytosis. Plan -Patient is already quit smoking and was complemented on this and encouraged to continue this. -Following with rheumatology for optimization of management of her gout. -She has had surgical excision of her left axillary hidradenitis -Would replace iron deficiency aggressively with IV iron.  In the past this has caused resolution of thrombocytosis. -If persistent thrombocytosis despite addressing the above factors might need to check clonal studies to rule out clonal thrombocytosis though this is less likely.  #3 vitamin B12 deficiency -Replace with B12 1000 mcg sublingual daily  FOLLOW UP: Labs today IV Injectafer weekly x 2 doses RTC with Dr Irene Limbo with labs in 2 months  All of the patients questions were answered with apparent satisfaction. The patient knows to call the clinic with any problems, questions or concerns.  I spent 30 counseling the patient face to face. The total time spent in the appointment was 45 mins and more than 50% was on counseling and direct patient cares.    Sullivan Lone MD [place] AAHIVMS [facility] Hematology/Oncology Physician [facility]  (Office):       [phone] (Work cell):  [phone] (Fax):           [phone]  01/28/2020 6:47 PM  I, Yevette Edwards, am acting as a scribe for Dr. Sullivan Lone.   .I have reviewed the above documentation for accuracy and completeness, and I agree with the above.  Brunetta Genera MD

## 2020-01-31 ENCOUNTER — Inpatient Hospital Stay: Payer: Medicare Other

## 2020-01-31 ENCOUNTER — Inpatient Hospital Stay: Payer: Medicare Other | Attending: Hematology | Admitting: Hematology

## 2020-01-31 ENCOUNTER — Other Ambulatory Visit: Payer: Self-pay

## 2020-01-31 VITALS — BP 104/72 | HR 88 | Temp 97.4°F | Resp 18 | Ht 69.0 in | Wt 284.7 lb

## 2020-01-31 DIAGNOSIS — N920 Excessive and frequent menstruation with regular cycle: Secondary | ICD-10-CM | POA: Diagnosis not present

## 2020-01-31 DIAGNOSIS — E538 Deficiency of other specified B group vitamins: Secondary | ICD-10-CM | POA: Insufficient documentation

## 2020-01-31 DIAGNOSIS — D509 Iron deficiency anemia, unspecified: Secondary | ICD-10-CM | POA: Diagnosis not present

## 2020-01-31 DIAGNOSIS — D75839 Thrombocytosis, unspecified: Secondary | ICD-10-CM

## 2020-01-31 DIAGNOSIS — M109 Gout, unspecified: Secondary | ICD-10-CM | POA: Diagnosis not present

## 2020-01-31 LAB — CMP (CANCER CENTER ONLY)
ALT: 13 U/L (ref 0–44)
AST: 11 U/L — ABNORMAL LOW (ref 15–41)
Albumin: 3.1 g/dL — ABNORMAL LOW (ref 3.5–5.0)
Alkaline Phosphatase: 68 U/L (ref 38–126)
Anion gap: 4 — ABNORMAL LOW (ref 5–15)
BUN: 11 mg/dL (ref 6–20)
CO2: 27 mmol/L (ref 22–32)
Calcium: 8.7 mg/dL — ABNORMAL LOW (ref 8.9–10.3)
Chloride: 104 mmol/L (ref 98–111)
Creatinine: 0.73 mg/dL (ref 0.44–1.00)
GFR, Estimated: >60 mL/min (ref >60)
Glucose, Bld: 81 mg/dL (ref 70–99)
Potassium: 3.5 mmol/L (ref 3.5–5.1)
Sodium: 135 mmol/L (ref 135–145)
Total Bilirubin: 0.3 mg/dL (ref 0.3–1.2)
Total Protein: 7.7 g/dL (ref 6.5–8.1)

## 2020-01-31 LAB — SEDIMENTATION RATE: Sed Rate: 27 mm/hr — ABNORMAL HIGH (ref 0–22)

## 2020-01-31 LAB — CBC WITH DIFFERENTIAL/PLATELET
Abs Immature Granulocytes: 0.03 10*3/uL (ref 0.00–0.07)
Basophils Absolute: 0.1 10*3/uL (ref 0.0–0.1)
Basophils Relative: 0 %
Eosinophils Absolute: 0.1 10*3/uL (ref 0.0–0.5)
Eosinophils Relative: 1 %
HCT: 30.3 % — ABNORMAL LOW (ref 36.0–46.0)
Hemoglobin: 9 g/dL — ABNORMAL LOW (ref 12.0–15.0)
Immature Granulocytes: 0 %
Lymphocytes Relative: 46 %
Lymphs Abs: 6.5 10*3/uL — ABNORMAL HIGH (ref 0.7–4.0)
MCH: 20.2 pg — ABNORMAL LOW (ref 26.0–34.0)
MCHC: 29.7 g/dL — ABNORMAL LOW (ref 30.0–36.0)
MCV: 67.9 fL — ABNORMAL LOW (ref 80.0–100.0)
Monocytes Absolute: 0.6 10*3/uL (ref 0.1–1.0)
Monocytes Relative: 5 %
Neutro Abs: 6.9 10*3/uL (ref 1.7–7.7)
Neutrophils Relative %: 48 %
Platelets: 714 10*3/uL — ABNORMAL HIGH (ref 150–400)
RBC: 4.46 MIL/uL (ref 3.87–5.11)
RDW: 19.8 % — ABNORMAL HIGH (ref 11.5–15.5)
WBC: 14.2 10*3/uL — ABNORMAL HIGH (ref 4.0–10.5)
nRBC: 0 % (ref 0.0–0.2)

## 2020-01-31 LAB — FERRITIN: Ferritin: 5 ng/mL — ABNORMAL LOW (ref 11–307)

## 2020-01-31 LAB — IRON AND TIBC
Iron: 14 ug/dL — ABNORMAL LOW (ref 41–142)
Saturation Ratios: 4 % — ABNORMAL LOW (ref 21–57)
TIBC: 354 ug/dL (ref 236–444)
UIBC: 340 ug/dL (ref 120–384)

## 2020-01-31 LAB — VITAMIN B12: Vitamin B-12: 192 pg/mL (ref 180–914)

## 2020-01-31 NOTE — Patient Instructions (Signed)
Thank you for choosing [facility] to provide your oncology and hematology care.   Should you have questions after your visit to the [facility] ([facility]), please contact this office at [phone] between 8:30 AM and 4:30 PM.  Voice mails left after 4:00 PM may not be returned until the following business day.  Calls received after 4:30 PM will be answered by an off-site Nurse Triage Line.    Prescription Refills:  Please have your pharmacy contact us directly for most prescription requests.  Contact the office directly for refills of narcotics (pain medications). Allow 48-72 hours for refills.  Appointments: Please contact the [facility] scheduling department [phone] for questions regarding [facility] appointment scheduling.  Contact the schedulers with any scheduling changes so that your appointment can be rescheduled in a timely manner.   Central Scheduling for [place] [phone] - Call to schedule procedures such as PET scans, CT scans, MRI, Ultrasound, etc.  To afford each patient quality time with our providers, please arrive 30 minutes before your scheduled appointment time.  If you arrive late for your appointment, you may be asked to reschedule.  We strive to give you quality time with our providers, and arriving late affects you and other patients whose appointments are after yours. If you are a no show for multiple scheduled visits, you may be dismissed from the clinic at the providers discretion.     Resources: [facility] Social Workers [phone] for additional information on assistance programs or assistance connecting with community support programs   [place] DSS  [phone]: Information regarding food stamps, Medicaid, and utility assistance [place] [employer] [place] [phone]   [place] [employer]'s shared-ride transportation service for eligible riders who have a disability that prevents them from riding the fixed route bus.   Medicare  Rights Center 800-333-4114 Helps people with Medicare understand their rights and benefits, navigate the Medicare system, and secure the quality healthcare they deserve [employer] 800-227-2345 Assists patients locate various types of support and financial assistance Cancer Care: 1-800-813-HOPE (4673) Provides financial assistance, online support groups, medication/co-pay assistance.   Transportation Assistance for appointments at [facility]: Transportation Coordinator [phone]  Again, thank you for choosing [facility] for your care.       

## 2020-02-06 ENCOUNTER — Other Ambulatory Visit: Payer: Self-pay | Admitting: Nurse Practitioner

## 2020-02-06 DIAGNOSIS — R110 Nausea: Secondary | ICD-10-CM

## 2020-02-06 NOTE — Telephone Encounter (Signed)
RX request.

## 2020-02-07 ENCOUNTER — Other Ambulatory Visit: Payer: Self-pay | Admitting: Nurse Practitioner

## 2020-02-07 ENCOUNTER — Telehealth: Payer: Self-pay | Admitting: Nurse Practitioner

## 2020-02-07 DIAGNOSIS — R110 Nausea: Secondary | ICD-10-CM

## 2020-02-07 MED ORDER — ONDANSETRON HCL 4 MG PO TABS: 4.0000 mg | ORAL_TABLET | Freq: Three times a day (TID) | ORAL | 0 refills | Status: DC | PRN

## 2020-02-07 MED ORDER — MELOXICAM 15 MG PO TABS: 15.0000 mg | ORAL_TABLET | Freq: Every day | ORAL | 0 refills | Status: DC

## 2020-02-07 NOTE — Telephone Encounter (Signed)
Mobic and Zofran sent

## 2020-02-08 ENCOUNTER — Telehealth: Payer: Self-pay | Admitting: *Deleted

## 2020-02-08 NOTE — Telephone Encounter (Signed)
-----   Message from Brunetta Genera, MD sent at 02/06/2020  9:53 AM EDT ----- Please let patient know in addition to severe iron deficiency which we are treating with IV iron she is also vitamin B12 deficient with a level of 192.  Would recommend starting over-the-counter B12 1000 mcg sublingual daily.

## 2020-02-08 NOTE — Telephone Encounter (Signed)
Contacted patient regarding test results per Dr. Grier Mitts directions regarding B12 results. Patient verbalized understanding.

## 2020-02-09 ENCOUNTER — Encounter: Payer: Medicare Other | Admitting: Medical

## 2020-02-09 ENCOUNTER — Inpatient Hospital Stay: Payer: Medicare Other | Attending: Hematology

## 2020-02-09 ENCOUNTER — Other Ambulatory Visit: Payer: Self-pay

## 2020-02-09 VITALS — BP 117/65 | HR 96 | Temp 98.1°F | Resp 18

## 2020-02-09 DIAGNOSIS — D509 Iron deficiency anemia, unspecified: Secondary | ICD-10-CM | POA: Diagnosis present

## 2020-02-09 DIAGNOSIS — D508 Other iron deficiency anemias: Secondary | ICD-10-CM

## 2020-02-09 MED ORDER — ACETAMINOPHEN 325 MG PO TABS
ORAL_TABLET | ORAL | Status: AC
  Filled 2020-02-09: qty 2

## 2020-02-09 MED ORDER — LORATADINE 10 MG PO TABS
ORAL_TABLET | ORAL | Status: AC
  Filled 2020-02-09: qty 1

## 2020-02-09 MED ORDER — LORATADINE 10 MG PO TABS
10.0000 mg | ORAL_TABLET | Freq: Once | ORAL | Status: AC
  Administered 2020-02-09: 10 mg via ORAL

## 2020-02-09 MED ORDER — ACETAMINOPHEN 325 MG PO TABS
650.0000 mg | ORAL_TABLET | Freq: Once | ORAL | Status: AC
  Administered 2020-02-09: 650 mg via ORAL

## 2020-02-09 MED ORDER — SODIUM CHLORIDE 0.9 % IV SOLN
750.0000 mg | Freq: Once | INTRAVENOUS | Status: AC
  Administered 2020-02-09: 750 mg via INTRAVENOUS
  Filled 2020-02-09: qty 15

## 2020-02-09 MED ORDER — SODIUM CHLORIDE 0.9 % IV SOLN
Freq: Once | INTRAVENOUS | Status: AC
  Filled 2020-02-09: qty 250

## 2020-02-09 NOTE — Patient Instructions (Signed)

## 2020-02-12 NOTE — Progress Notes (Signed)
Erroneous encounter

## 2020-02-16 ENCOUNTER — Other Ambulatory Visit: Payer: Self-pay

## 2020-02-16 ENCOUNTER — Inpatient Hospital Stay: Payer: Medicare Other

## 2020-02-16 VITALS — BP 118/71 | HR 88 | Temp 98.2°F | Resp 18

## 2020-02-16 DIAGNOSIS — D509 Iron deficiency anemia, unspecified: Secondary | ICD-10-CM | POA: Diagnosis not present

## 2020-02-16 DIAGNOSIS — D508 Other iron deficiency anemias: Secondary | ICD-10-CM

## 2020-02-16 MED ORDER — SODIUM CHLORIDE 0.9 % IV SOLN
750.0000 mg | Freq: Once | INTRAVENOUS | Status: AC
  Administered 2020-02-16: 750 mg via INTRAVENOUS
  Filled 2020-02-16: qty 15

## 2020-02-16 MED ORDER — LORATADINE 10 MG PO TABS
10.0000 mg | ORAL_TABLET | Freq: Once | ORAL | Status: AC
  Administered 2020-02-16: 10 mg via ORAL

## 2020-02-16 MED ORDER — ACETAMINOPHEN 325 MG PO TABS
ORAL_TABLET | ORAL | Status: AC
  Filled 2020-02-16: qty 2

## 2020-02-16 MED ORDER — LORATADINE 10 MG PO TABS
ORAL_TABLET | ORAL | Status: AC
  Filled 2020-02-16: qty 1

## 2020-02-16 MED ORDER — ACETAMINOPHEN 325 MG PO TABS
650.0000 mg | ORAL_TABLET | Freq: Once | ORAL | Status: AC
  Administered 2020-02-16: 650 mg via ORAL

## 2020-02-16 MED ORDER — SODIUM CHLORIDE 0.9 % IV SOLN
Freq: Once | INTRAVENOUS | Status: AC
  Filled 2020-02-16: qty 250

## 2020-02-16 NOTE — Progress Notes (Signed)
Patient received IV Iron today. Upon finishing VSS patient stated she did not want to wait the thirty minute post observation and was let go

## 2020-03-06 ENCOUNTER — Ambulatory Visit: Payer: Medicare Other | Admitting: Nurse Practitioner

## 2020-03-14 ENCOUNTER — Other Ambulatory Visit: Payer: Self-pay | Admitting: Family Medicine

## 2020-03-14 ENCOUNTER — Ambulatory Visit: Payer: Medicare Other | Admitting: Family

## 2020-03-14 ENCOUNTER — Ambulatory Visit: Payer: Medicare Other | Admitting: Family Medicine

## 2020-03-14 DIAGNOSIS — L732 Hidradenitis suppurativa: Secondary | ICD-10-CM

## 2020-04-01 ENCOUNTER — Other Ambulatory Visit: Payer: Self-pay | Admitting: Nurse Practitioner

## 2020-04-01 DIAGNOSIS — R110 Nausea: Secondary | ICD-10-CM

## 2020-04-02 NOTE — Telephone Encounter (Signed)
Is this okay to refill? 

## 2020-04-08 ENCOUNTER — Inpatient Hospital Stay: Payer: Medicare Other

## 2020-04-08 ENCOUNTER — Telehealth: Payer: Self-pay | Admitting: Hematology

## 2020-04-08 ENCOUNTER — Inpatient Hospital Stay: Payer: Medicare Other | Admitting: Hematology

## 2020-04-08 NOTE — Progress Notes (Incomplete)
HEMATOLOGY/ONCOLOGY CONSULTATION NOTE  Date of Service: .01/31/2020   PCP - Dionisio David (Sickle cell center)/[facility] (Gout management) Gennette Pac MD Cardiology - Cristopher [place]  CHIEF COMPLAINTS/PURPOSE OF CONSULTATION:  Thrombocytosis Severe iron deficiency anemia  HISTORY OF PRESENTING ILLNESS:  Amanda Davenport is a wonderful 40 y.o. female who has been referred to [place] for evaluation and management of thrombocytosis.  She was last seen by [place] in 2016 and was lost to follow-up due to issues with her medical insurance.  Patient has been referred back to [place] for evaluation of her intermittent thrombocytosis and severe iron deficiency anemia. The pt reports her hidradenitis suppurativa has been quite bothersome and she had surgery on 4/23-- left HS excision--still has an open surgical wound with some oozing .  Had blood loss during surgery as well.  Patient notes she has had significant issues with severe gout attacks- prednisone, allopurinol/colchicine/febostat.  She is following with Dr.Khiem Gardiner Sleeper MD for her rheumatology cares.  She continues to have heavy menstrual periods lasting 5 days of which 4 are very heavy. Has been taking Geritol  1 month and also has continued taking prenatal vitamins No personal history of VTE No FHx of VTE, bleeding or clotting disorders but mother had PE in the setting of surgery and active breast cancer. Quit smoking tobacco- 07/2019. 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  On review of systems, pt reports severe fatigue and some ice cravings.  No fevers chills night sweats or new bone pains.  INTERVAL HISTORY: Amanda Davenport is a wonderful 40 y.o. female  who is here for evaluation and management of thrombocytosis. The patient's last visit with [place] was on 01/31/2020. The pt reports that she is doing well overall.  The pt reports ***  Lab results today (04/08/20) of CBC w/diff and CMP is as follows: all values are WNL except for ***. 04/08/2020 Ferritin at *** 04/08/2020 Iron Panel is as follows: ***  On review of systems, pt reports *** and denies *** and any other symptoms.   A&P: -Discussed pt labwork today, 04/08/20; *** -***  MEDICAL HISTORY:  Past Medical History:  Diagnosis Date  . Allergy   . Anemia    receives transfusions periodically  . Anxiety   . Arrhythmia   . Chronic headache   . Depression   . Dysrhythmia   . Gout 12/2018  . Knee pain   . Nearsightedness    wears glasses  . Neuropathy   . Obesity   . Pneumonia   . Pre-diabetes   . Recurrent boils   . WPW (Wolff-Parkinson-White syndrome)     SURGICAL HISTORY: Past Surgical History:  Procedure Laterality Date  . ADJACENT TISSUE TRANSFER/TISSUE REARRANGEMENT Left 07/28/2019   Procedure: ADJACENT TISSUE TRANSFER TO LEFT AXILLA GREATER THAN 100 CM SQUARED;  Surgeon: Irene Limbo, MD;  Location: [facility];  Service: Plastics;  Laterality: Left;  . HYDRADENITIS EXCISION Left 07/28/2019   Procedure: EXCISION LEFT HIDRADENITIS AXILLA;  Surgeon: Clovis Riley, MD;  Location: [facility];  Service: General;  Laterality: Left;  . TONSILLECTOMY      SOCIAL HISTORY: Social History   Socioeconomic History  .  Marital status: Single    Spouse name: Not on file  . Number of children: Not on file  . Years of education: Not on file  . Highest education level: Not on file  Occupational History  . Not on file  Tobacco Use  . Smoking status: Former Smoker    Years: 0.50    Types: Cigarettes, Cigars    Quit date: 04/30/2019    Years since quitting: 0.9  . Smokeless tobacco: Never Used  . Tobacco comment: smokes black and milds - last use early-mid August  Vaping  Use  . Vaping Use: Never used  Substance and Sexual Activity  . Alcohol use: No  . Drug use: Yes    Types: Marijuana    Comment: 2+ times per month  . Sexual activity: Yes    Birth control/protection: None  Other Topics Concern  . Not on file  Social History Narrative  . Not on file   Social Determinants of Health   Financial Resource Strain: Not on file  Food Insecurity: Not on file  Transportation Needs: Not on file  Physical Activity: Not on file  Stress: Not on file  Social Connections: Not on file  Intimate Partner Violence: Not on file    FAMILY HISTORY: Family History  Problem Relation Age of Onset  . Breast cancer Mother   . Pulmonary embolism Mother        died of PE  . Colon cancer Mother   . Irritable bowel syndrome Mother   . Cancer Mother   . Hypertension Father   . Diabetes Paternal Grandmother   . Heart disease Neg Hx   . Stroke Neg Hx     ALLERGIES:  is allergic to other, penicillins, shellfish-derived products, shrimp [shellfish allergy], and sulfa antibiotics.  MEDICATIONS:  Current Outpatient Medications  Medication Sig Dispense Refill  . colchicine 0.6 MG tablet TAKE 1 TABLET (0.6 MG TOTAL) BY MOUTH EVERY 8 (EIGHT) HOURS AS NEEDED (FOR GOUTY ATTACKS). (Patient not taking: Reported on 12/06/2019) 30 tablet 3  . colchicine 0.6 MG tablet Take by mouth. (Patient not taking: Reported on 01/31/2020)    . febuxostat (ULORIC) 40 MG tablet Take 2 tablets (80 mg total) by mouth daily. 60 tablet 2  . gabapentin (NEURONTIN) 600 MG tablet Take 1 tablet (600 mg total) by mouth 3 (three) times daily. 90 tablet 11  . ibuprofen (ADVIL) 800 MG tablet TAKE 1 TABLET BY MOUTH EVERY 8 HOURS AS NEEDED FOR MODERATE PAIN. EAT BEFORE TAKING MEDICATION 60 tablet 0  . meloxicam (MOBIC) 15 MG tablet Take 1 tablet (15 mg total) by mouth daily. 30 tablet 0  . mupirocin ointment (BACTROBAN) 2 % Apply to affected skin areas twice daily x5 days then as needed 22 g 3  . ondansetron  (ZOFRAN) 4 MG tablet TAKE 1 TABLET(4 MG) BY MOUTH EVERY 8 HOURS AS NEEDED FOR NAUSEA OR VOMITING 20 tablet 0  . predniSONE (DELTASONE) 10 MG tablet Take 5 po for 2 days then 4 po daily for 2d then 3 po daily for 1 d then 2 daily for 1 day then  1 daily for 1 day, then discontinue.always take with food (Patient not taking: Reported on 01/31/2020)    . predniSONE (DELTASONE) 20 MG tablet TAKE 3 TABLETS BY MOUTH DAILY FOR 3 DAYS THEN TAKE 2 TABLETS BY MOUTH DAILY FOR 3 DAYS THEN TAKE 1 TABLET BY MOUTH DAILY FOR 3 DAYS    . triamcinolone cream (KENALOG) 0.1 % Apply 1  application topically 2 (two) times daily. 456.6 g 0   No current facility-administered medications for this visit.    REVIEW OF SYSTEMS:   A 10+ POINT REVIEW OF SYSTEMS WAS OBTAINED including neurology, dermatology, psychiatry, cardiac, respiratory, lymph, extremities, GI, GU, Musculoskeletal, constitutional, breasts, reproductive, HEENT.  All pertinent positives are noted in the HPI.  All others are negative.   PHYSICAL EXAMINATION: ECOG PERFORMANCE STATUS: 2 - Symptomatic, <50% confined to bed  . There were no vitals filed for this visit. There were no vitals filed for this visit. .There is no height or weight on file to calculate BMI.  *** GENERAL:alert, in no acute distress and comfortable SKIN: no acute rashes, no significant lesions EYES: conjunctiva are pink and non-injected, sclera anicteric OROPHARYNX: MMM, no exudates, no oropharyngeal erythema or ulceration NECK: supple, no JVD LYMPH:  no palpable lymphadenopathy in the cervical, axillary or inguinal regions LUNGS: clear to auscultation b/l with normal respiratory effort HEART: regular rate & rhythm ABDOMEN:  normoactive bowel sounds , non tender, not distended. No palpable hepatosplenomegaly.  Extremity: no pedal edema PSYCH: alert & oriented x 3 with fluent speech NEURO: no focal motor/sensory deficits  LABORATORY DATA:  I have reviewed the data as  listed  . CBC Latest Ref Rng & Units 01/31/2020 01/11/2020 12/06/2019  WBC 4.0 - 10.5 K/uL 14.2(H) 8.3 8.3  Hemoglobin 12.0 - 15.0 g/dL 9.0(L) 8.7(L) 8.9(L)  Hematocrit 36.0 - 46.0 % 30.3(L) 31.9(L) 31.0(L)  Platelets 150 - 400 K/uL 714(H) 639(H) 666(H)   . CBC    Component Value Date/Time   WBC 14.2 (H) 01/31/2020 1241   RBC 4.46 01/31/2020 1241   HGB 9.0 (L) 01/31/2020 1241   HGB 8.7 (L) 01/11/2020 1015   HGB 9.5 (L) 12/18/2014 1518   HCT 30.3 (L) 01/31/2020 1241   HCT 31.9 (L) 01/11/2020 1015   HCT 31.4 (L) 12/18/2014 1518   PLT 714 (H) 01/31/2020 1241   PLT 639 (H) 01/11/2020 1015   MCV 67.9 (L) 01/31/2020 1241   MCV 71 (L) 01/11/2020 1015   MCV 67.0 (L) 12/18/2014 1518   MCH 20.2 (L) 01/31/2020 1241   MCHC 29.7 (L) 01/31/2020 1241   RDW 19.8 (H) 01/31/2020 1241   RDW 18.6 (H) 01/11/2020 1015   RDW 26.0 (H) 12/18/2014 1518   LYMPHSABS 6.5 (H) 01/31/2020 1241   LYMPHSABS 2.0 01/11/2020 1015   LYMPHSABS 1.6 12/18/2014 1518   MONOABS 0.6 01/31/2020 1241   MONOABS 0.3 12/18/2014 1518   EOSABS 0.1 01/31/2020 1241   EOSABS 0.2 01/11/2020 1015   BASOSABS 0.1 01/31/2020 1241   BASOSABS 0.1 01/11/2020 1015   BASOSABS 0.0 12/18/2014 1518    . CMP Latest Ref Rng & Units 01/31/2020 12/06/2019 07/19/2019  Glucose 70 - 99 mg/dL 81 86 751(W)  BUN 6 - 20 mg/dL 11 12 9   Creatinine 0.44 - 1.00 mg/dL 2.58 5.27  Sodium 135 - 145 mmol/L 135 139 141  Potassium 3.5 - 5.1 mmol/L 3.5 4.3 3.6  Chloride 98 - 111 mmol/L 104 103 107  CO2 22 - 32 mmol/L 27 - 25  Calcium 8.9 - 10.3 mg/dL 7.82) 9.2 8.9  Total Protein 6.5 - 8.1 g/dL 7.7 8.5 -  Total Bilirubin 0.3 - 1.2 mg/dL 0.3 4.2(P -  Alkaline Phos 38 - 126 U/L 68 85 -  AST 15 - 41 U/L 11(L) 13 -  ALT 0 - 44 U/L 13 - -   . Lab Results  Component Value Date  IRON 14 (L) 01/31/2020   TIBC 354 01/31/2020   IRONPCTSAT 4 (L) 01/31/2020   (Iron and TIBC)  Lab Results  Component Value Date   FERRITIN 5 (L) 01/31/2020   B12  ---192    RADIOGRAPHIC STUDIES: I have personally reviewed the radiological images as listed and agreed with the findings in the report. No results found.  ASSESSMENT & PLAN:   40 year old African American female with  #1 Microcytic anemia - likely due to severe iron deficiency.  Unclear etiology possibly chronic hemorrhoidal bleeding versus heavy periods versus other GI losses (?NSAIDS associated ulceration). Additional  Elements causing anemia include chronic inflammatory state due to hydradenitis suppurativa and severe gout. Given the relatively high RBC numbers for the given hemoglobin cannot rule out underlying thalassemia/hemoglobinopathy. Plan: ***   #2 thrombocytosis This is likely reactive from severe iron deficiency anemia, surgery, previous smoking, inflammation from her hidradenitis suppurativa and gout. Given her age unlikely to be a myeloproliferative neoplasm like essential thrombocytosis. Plan -Patient is already quit smoking and was complemented on this and encouraged to continue this. -Following with rheumatology for optimization of management of her gout. -She has had surgical excision of her left axillary hidradenitis -Would replace iron deficiency aggressively with IV iron.  In the past this has caused resolution of thrombocytosis. -If persistent thrombocytosis despite addressing the above factors might need to check clonal studies to rule out clonal thrombocytosis though this is less likely.  #3 vitamin B12 deficiency -Replace with B12 1000 mcg sublingual daily  FOLLOW UP: ***  The total time spent in the appt was *** minutes and more than 50% was on counseling and direct patient cares.  All of the patient's questions were answered with apparent satisfaction. The patient knows to call the clinic with any problems, questions or concerns.    Sullivan Lone MD [place] AAHIVMS [facility] Hematology/Oncology Physician [facility]  (Office):        [phone] (Work cell):  [phone] (Fax):           [phone]  04/08/2020 9:04 AM  I, Yevette Edwards, am acting as a scribe for Dr. Sullivan Lone.   {Add Barista Statement}

## 2020-04-08 NOTE — Telephone Encounter (Signed)
Left message to reschedule appointment today per 1/3 schedule message. Gave option to call back to reschedule appointment.

## 2020-04-08 NOTE — Telephone Encounter (Signed)
Patient called back to reschedule appointment. Rescheduled appointment to next week. Patient is aware of changes.

## 2020-04-17 ENCOUNTER — Inpatient Hospital Stay (HOSPITAL_BASED_OUTPATIENT_CLINIC_OR_DEPARTMENT_OTHER): Payer: Medicare Other | Admitting: Hematology

## 2020-04-17 ENCOUNTER — Inpatient Hospital Stay: Payer: Medicare Other | Attending: Hematology

## 2020-04-17 ENCOUNTER — Other Ambulatory Visit: Payer: Self-pay

## 2020-04-17 VITALS — BP 127/77 | HR 89 | Temp 97.8°F | Resp 17 | Ht 69.0 in | Wt 290.5 lb

## 2020-04-17 DIAGNOSIS — D509 Iron deficiency anemia, unspecified: Secondary | ICD-10-CM | POA: Diagnosis present

## 2020-04-17 DIAGNOSIS — E538 Deficiency of other specified B group vitamins: Secondary | ICD-10-CM

## 2020-04-17 DIAGNOSIS — D75839 Thrombocytosis, unspecified: Secondary | ICD-10-CM | POA: Insufficient documentation

## 2020-04-17 DIAGNOSIS — M109 Gout, unspecified: Secondary | ICD-10-CM | POA: Diagnosis not present

## 2020-04-17 LAB — CBC WITH DIFFERENTIAL/PLATELET
Abs Immature Granulocytes: 0.04 10*3/uL (ref 0.00–0.07)
Basophils Absolute: 0 10*3/uL (ref 0.0–0.1)
Basophils Relative: 0 %
Eosinophils Absolute: 0.1 10*3/uL (ref 0.0–0.5)
Eosinophils Relative: 1 %
HCT: 36.1 % (ref 36.0–46.0)
Hemoglobin: 11 g/dL — ABNORMAL LOW (ref 12.0–15.0)
Immature Granulocytes: 1 %
Lymphocytes Relative: 31 %
Lymphs Abs: 2.7 10*3/uL (ref 0.7–4.0)
MCH: 26 pg (ref 26.0–34.0)
MCHC: 30.5 g/dL (ref 30.0–36.0)
MCV: 85.3 fL (ref 80.0–100.0)
Monocytes Absolute: 0.4 10*3/uL (ref 0.1–1.0)
Monocytes Relative: 4 %
Neutro Abs: 5.5 10*3/uL (ref 1.7–7.7)
Neutrophils Relative %: 63 %
Platelets: 469 10*3/uL — ABNORMAL HIGH (ref 150–400)
RBC: 4.23 MIL/uL (ref 3.87–5.11)
RDW: 24.2 % — ABNORMAL HIGH (ref 11.5–15.5)
WBC: 8.7 10*3/uL (ref 4.0–10.5)
nRBC: 0 % (ref 0.0–0.2)

## 2020-04-17 LAB — IRON AND TIBC
Iron: 41 ug/dL (ref 41–142)
Saturation Ratios: 19 % — ABNORMAL LOW (ref 21–57)
TIBC: 215 ug/dL — ABNORMAL LOW (ref 236–444)
UIBC: 174 ug/dL (ref 120–384)

## 2020-04-17 LAB — FERRITIN: Ferritin: 223 ng/mL (ref 11–307)

## 2020-04-17 NOTE — Progress Notes (Signed)
HEMATOLOGY/ONCOLOGY CONSULTATION NOTE  Date of Service: .01/31/2020   PCP [facility] (Gout management) Gennette Pac MD Cardiology - Cristopher Peru  CHIEF COMPLAINTS/PURPOSE OF CONSULTATION:  Thrombocytosis Severe iron deficiency anemia  HISTORY OF PRESENTING ILLNESS:  Amanda Davenport is a wonderful 40 y.o. female who has been referred to [place] for evaluation and management of thrombocytosis.  She was last seen by [place] in 2016 and was lost to follow-up due to issues with her medical insurance.  Patient has been referred back to [place] for evaluation of her intermittent thrombocytosis and severe iron deficiency anemia. The pt reports her hidradenitis suppurativa has been quite bothersome and she had surgery on 4/23-- left HS excision--still has an open surgical wound with some oozing .  Had blood loss during surgery as well.  Patient notes she has had significant issues with severe gout attacks- prednisone, allopurinol/colchicine/febostat.  She is following with Dr.Khiem Gardiner Sleeper MD for her rheumatology cares.  She continues to have heavy menstrual periods lasting 5 days of which 4 are very heavy. Has been taking Geritol  1 month and also has continued taking prenatal vitamins No personal history of VTE No FHx of VTE, bleeding or clotting disorders but mother had PE in the setting of surgery and active breast cancer. Quit smoking tobacco- 07/2019. 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  On review of systems, pt reports severe fatigue and some ice cravings.  No fevers chills night sweats or new bone pains.  INTERVAL HISTORY:  Amanda Davenport is a wonderful 40 y.o. female who is here for evaluation and  management of thrombocytosis. The patient's last visit with [place] was on 01/31/2020. The pt reports that she is doing well overall.  The pt reports that she is feefing better after the iron infusion. Hgb has improved from 8.7 to 11. Ferritin is improved from 5 to 223 and iron saturation improved from 4% to 19^%.  No other acute new symptoms.  MEDICAL HISTORY:  Past Medical History:  Diagnosis Date  . Allergy   . Anemia    receives transfusions periodically  . Anxiety   . Arrhythmia   . Chronic headache   . Depression   . Dysrhythmia   . Gout 12/2018  . Knee pain   . Nearsightedness    wears glasses  . Neuropathy   . Obesity   . Pneumonia   . Pre-diabetes   . Recurrent boils   . WPW (Wolff-Parkinson-White syndrome)     SURGICAL HISTORY: Past Surgical History:  Procedure Laterality Date  . ADJACENT TISSUE TRANSFER/TISSUE REARRANGEMENT Left 07/28/2019   Procedure: ADJACENT TISSUE TRANSFER TO LEFT AXILLA GREATER THAN 100 CM SQUARED;  Surgeon: Irene Limbo, MD;  Location: [facility];  Service: Plastics;  Laterality: Left;  . HYDRADENITIS EXCISION Left 07/28/2019   Procedure: EXCISION LEFT HIDRADENITIS AXILLA;  Surgeon: Clovis Riley, MD;  Location: [facility];  Service: General;  Laterality: Left;  . TONSILLECTOMY      SOCIAL HISTORY: Social History   Socioeconomic History  . Marital status: Single    Spouse name: Not on file  . Number of children: Not on file  . Years  of education: Not on file  . Highest education level: Not on file  Occupational History  . Not on file  Tobacco Use  . Smoking status: Former Smoker    Years: 0.50    Types: Cigarettes, Cigars    Quit date: 04/30/2019    Years since quitting: 0.9  . Smokeless tobacco: Never Used  . Tobacco comment: smokes black and milds - last use early-mid August  Vaping Use  . Vaping Use: Never used  Substance and Sexual Activity  . Alcohol use: No  . Drug use: Yes    Types: Marijuana    Comment: 2+ times per  month  . Sexual activity: Yes    Birth control/protection: None  Other Topics Concern  . Not on file  Social History Narrative  . Not on file   Social Determinants of Health   Financial Resource Strain: Not on file  Food Insecurity: Not on file  Transportation Needs: Not on file  Physical Activity: Not on file  Stress: Not on file  Social Connections: Not on file  Intimate Partner Violence: Not on file    FAMILY HISTORY: Family History  Problem Relation Age of Onset  . Breast cancer Mother   . Pulmonary embolism Mother        died of PE  . Colon cancer Mother   . Irritable bowel syndrome Mother   . Cancer Mother   . Hypertension Father   . Diabetes Paternal Grandmother   . Heart disease Neg Hx   . Stroke Neg Hx     ALLERGIES:  is allergic to other, penicillins, shellfish-derived products, shrimp [shellfish allergy], and sulfa antibiotics.  MEDICATIONS:  Current Outpatient Medications  Medication Sig Dispense Refill  . colchicine 0.6 MG tablet TAKE 1 TABLET (0.6 MG TOTAL) BY MOUTH EVERY 8 (EIGHT) HOURS AS NEEDED (FOR GOUTY ATTACKS). (Patient not taking: Reported on 12/06/2019) 30 tablet 3  . colchicine 0.6 MG tablet Take by mouth. (Patient not taking: Reported on 01/31/2020)    . febuxostat (ULORIC) 40 MG tablet Take 2 tablets (80 mg total) by mouth daily. 60 tablet 2  . gabapentin (NEURONTIN) 600 MG tablet Take 1 tablet (600 mg total) by mouth 3 (three) times daily. 90 tablet 11  . ibuprofen (ADVIL) 800 MG tablet TAKE 1 TABLET BY MOUTH EVERY 8 HOURS AS NEEDED FOR MODERATE PAIN. EAT BEFORE TAKING MEDICATION 60 tablet 0  . meloxicam (MOBIC) 15 MG tablet Take 1 tablet (15 mg total) by mouth daily. 30 tablet 0  . mupirocin ointment (BACTROBAN) 2 % Apply to affected skin areas twice daily x5 days then as needed 22 g 3  . ondansetron (ZOFRAN) 4 MG tablet TAKE 1 TABLET(4 MG) BY MOUTH EVERY 8 HOURS AS NEEDED FOR NAUSEA OR VOMITING 20 tablet 0  . predniSONE (DELTASONE) 10 MG  tablet Take 5 po for 2 days then 4 po daily for 2d then 3 po daily for 1 d then 2 daily for 1 day then  1 daily for 1 day, then discontinue.always take with food (Patient not taking: Reported on 01/31/2020)    . predniSONE (DELTASONE) 20 MG tablet TAKE 3 TABLETS BY MOUTH DAILY FOR 3 DAYS THEN TAKE 2 TABLETS BY MOUTH DAILY FOR 3 DAYS THEN TAKE 1 TABLET BY MOUTH DAILY FOR 3 DAYS    . triamcinolone cream (KENALOG) 0.1 % Apply 1 application topically 2 (two) times daily. 456.6 g 0   No current facility-administered medications for this visit.    REVIEW  OF SYSTEMS:   A 10+ POINT REVIEW OF SYSTEMS WAS OBTAINED including neurology, dermatology, psychiatry, cardiac, respiratory, lymph, extremities, GI, GU, Musculoskeletal, constitutional, breasts, reproductive, HEENT.  All pertinent positives are noted in the HPI.  All others are negative.   PHYSICAL EXAMINATION: ECOG PERFORMANCE STATUS: 2 - Symptomatic, <50% confined to bed  . Vitals:   04/17/20 1146  BP: 127/77  Pulse: 89  Resp: 17  Temp: 97.8 F (36.6 C)  SpO2: 100%   Filed Weights   04/17/20 1146  Weight: 290 lb 8 oz (131.8 kg)   .Body mass index is 42.9 kg/m. NAD GENERAL:alert, in no acute distress and comfortable SKIN: no acute rashes, no significant lesions EYES: conjunctiva are pink and non-injected, sclera anicteric OROPHARYNX: MMM, no exudates, no oropharyngeal erythema or ulceration NECK: supple, no JVD LYMPH:  no palpable lymphadenopathy in the cervical, axillary or inguinal regions LUNGS: clear to auscultation b/l with normal respiratory effort HEART: regular rate & rhythm ABDOMEN:  normoactive bowel sounds , non tender, not distended. No palpable hepatosplenomegaly.  Extremity: no pedal edema PSYCH: alert & oriented x 3 with fluent speech NEURO: no focal motor/sensory deficits  LABORATORY DATA:  I have reviewed the data as listed  . CBC Latest Ref Rng & Units 04/17/2020 01/31/2020 01/11/2020  WBC 4.0 - 10.5 K/uL  8.7 14.2(H) 8.3  Hemoglobin 12.0 - 15.0 g/dL 11.0(L) 9.0(L) 8.7(L)  Hematocrit 36.0 - 46.0 % 36.1 30.3(L) 31.9(L)  Platelets 150 - 400 K/uL 469(H) 714(H) 639(H)   . CBC    Component Value Date/Time   WBC 8.7 04/17/2020 1108   RBC 4.23 04/17/2020 1108   HGB 11.0 (L) 04/17/2020 1108   HGB 8.7 (L) 01/11/2020 1015   HGB 9.5 (L) 12/18/2014 1518   HCT 36.1 04/17/2020 1108   HCT 31.9 (L) 01/11/2020 1015   HCT 31.4 (L) 12/18/2014 1518   PLT 469 (H) 04/17/2020 1108   PLT 639 (H) 01/11/2020 1015   MCV 85.3 04/17/2020 1108   MCV 71 (L) 01/11/2020 1015   MCV 67.0 (L) 12/18/2014 1518   MCH 26.0 04/17/2020 1108   MCHC 30.5 04/17/2020 1108   RDW 24.2 (H) 04/17/2020 1108   RDW 18.6 (H) 01/11/2020 1015   RDW 26.0 (H) 12/18/2014 1518   LYMPHSABS 2.7 04/17/2020 1108   LYMPHSABS 2.0 01/11/2020 1015   LYMPHSABS 1.6 12/18/2014 1518   MONOABS 0.4 04/17/2020 1108   MONOABS 0.3 12/18/2014 1518   EOSABS 0.1 04/17/2020 1108   EOSABS 0.2 01/11/2020 1015   BASOSABS 0.0 04/17/2020 1108   BASOSABS 0.1 01/11/2020 1015   BASOSABS 0.0 12/18/2014 1518    . CMP Latest Ref Rng & Units 01/31/2020 12/06/2019 07/19/2019  Glucose 70 - 99 mg/dL 81 86 118(H)  BUN 6 - 20 mg/dL 11 12 9   Creatinine 0.44 - 1.00 mg/dL 0.73 0.86 0.94  Sodium 135 - 145 mmol/L 135 139 141  Potassium 3.5 - 5.1 mmol/L 3.5 4.3 3.6  Chloride 98 - 111 mmol/L 104 103 107  CO2 22 - 32 mmol/L 27 - 25  Calcium 8.9 - 10.3 mg/dL 8.7(L) 9.2 8.9  Total Protein 6.5 - 8.1 g/dL 7.7 8.5 -  Total Bilirubin 0.3 - 1.2 mg/dL 0.3 <0.2 -  Alkaline Phos 38 - 126 U/L 68 85 -  AST 15 - 41 U/L 11(L) 13 -  ALT 0 - 44 U/L 13 - -   . Lab Results  Component Value Date   IRON 14 (L) 01/31/2020   TIBC 354 01/31/2020  IRONPCTSAT 4 (L) 01/31/2020   (Iron and TIBC)  Lab Results  Component Value Date   FERRITIN 5 (L) 01/31/2020   . Lab Results  Component Value Date   IRON 41 04/17/2020   TIBC 215 (L) 04/17/2020   IRONPCTSAT 19 (L) 04/17/2020    (Iron and TIBC)  Lab Results  Component Value Date   FERRITIN 223 04/17/2020    B12 ---192    RADIOGRAPHIC STUDIES: I have personally reviewed the radiological images as listed and agreed with the findings in the report. No results found.  ASSESSMENT & PLAN:   40 year old African American female with  #1 Microcytic anemia - likely due to severe iron deficiency.  Unclear etiology possibly chronic hemorrhoidal bleeding versus heavy periods versus other GI losses (?NSAIDS associated ulceration). Additional  Elements causing anemia include chronic inflammatory state due to hydradenitis suppurativa and severe gout. Given the relatively high RBC numbers for the given hemoglobin cannot rule out underlying thalassemia/hemoglobinopathy.  #2 thrombocytosis This is likely reactive from severe iron deficiency anemia, surgery, previous smoking, inflammation from her hidradenitis suppurativa and gout. Given her age unlikely to be a myeloproliferative neoplasm like essential thrombocytosis. Plan -Patient is already quit smoking and was complemented on this and encouraged to continue this. -Following with rheumatology for optimization of management of her gout. -She has had surgical excision of her left axillary hidradenitis -improvement in her thrombocytosis with IV iron replacement. -anemia much improved with IV Iron and hgb up from 8.7 to 11 with ferritin of 223. -no indication for additional IV iron at this time. #3 vitamin B12 deficiency -Replace with B12 1000 mcg sublingual daily  FOLLOW UP:  RTC with Dr Irene Limbo with labs in 4 months  The total time spent in the appt was 20 minutes and more than 50% was on counseling and direct patient cares.  All of the patient's questions were answered with apparent satisfaction. The patient knows to call the clinic with any problems, questions or concerns.    Sullivan Lone MD [place] AAHIVMS [facility] [place] Hematology/Oncology Physician [facility]  (Office):       [phone] (Work cell):  [phone] (Fax):           [phone]  04/17/2020 3:49 AM  I, Yevette Edwards, am acting as a scribe for Dr. Sullivan Lone.   .I have reviewed the above documentation for accuracy and completeness, and I agree with the above. Brunetta Genera MD

## 2020-04-27 DIAGNOSIS — M109 Gout, unspecified: Secondary | ICD-10-CM | POA: Insufficient documentation

## 2020-04-27 DIAGNOSIS — U071 COVID-19: Secondary | ICD-10-CM | POA: Insufficient documentation

## 2020-04-29 ENCOUNTER — Telehealth: Payer: Self-pay | Admitting: Hematology

## 2020-04-29 NOTE — Telephone Encounter (Signed)
Rescheduled upcoming appointment per 1/23 schedule message. Patient is aware of changes.

## 2020-05-16 ENCOUNTER — Other Ambulatory Visit: Payer: Self-pay | Admitting: Nurse Practitioner

## 2020-05-23 ENCOUNTER — Telehealth: Payer: Self-pay

## 2020-05-23 NOTE — Telephone Encounter (Signed)
NOTES ON FILE FROM [facility] [phone], SENT REFERRAL TO SCHEDULING 

## 2020-06-06 ENCOUNTER — Institutional Professional Consult (permissible substitution): Payer: Medicare Other | Admitting: Internal Medicine

## 2020-06-17 ENCOUNTER — Encounter: Payer: Self-pay | Admitting: Internal Medicine

## 2020-06-17 ENCOUNTER — Ambulatory Visit (INDEPENDENT_AMBULATORY_CARE_PROVIDER_SITE_OTHER): Payer: Medicare Other | Admitting: Internal Medicine

## 2020-06-17 VITALS — BP 106/68 | HR 111 | Ht 69.0 in | Wt 292.6 lb

## 2020-06-17 DIAGNOSIS — I456 Pre-excitation syndrome: Secondary | ICD-10-CM | POA: Diagnosis not present

## 2020-06-17 MED ORDER — METOPROLOL SUCCINATE ER 25 MG PO TB24: 25.0000 mg | ORAL_TABLET | Freq: Every day | ORAL | 3 refills | Status: DC

## 2020-06-17 NOTE — Progress Notes (Signed)
HPI Amanda Davenport returns today for followup. She is a pleasant 40 yo woman with a h/o tachypalpitations and SVT/WPW who I saw over 7 years ago. She was seen in the ER. She has palpitations and heart racing but no syncope. She has done fairly well in the past with her SVT when she was on metoprolol but she has stopped taking her medications. No syncope.  Allergies  Allergen Reactions  . Other Other (See Comments)    All Antibiotics cause severe vaginal yeast infections  . Penicillins Hives, Itching, Swelling and Rash    Has patient had a PCN reaction causing immediate rash, facial/tongue/throat swelling, SOB or lightheadedness with hypotension: Yes Has patient had a PCN reaction causing severe rash involving mucus membranes or skin necrosis: Yes Has patient had a PCN reaction that required hospitalization Yes Has patient had a PCN reaction occurring within the last 10 years: Yes If all of the above answers are "NO", then may proceed with Cephalosporin use.  Has patient had a PCN reaction causing immediate rash, facial/tongue/throat swelling, SOB or lightheadedness with hypotension: Yes Has patient had a PCN reaction causing severe rash involving mucus membranes or skin necrosis: Yes Has patient had a PCN reaction that required hospitalization Yes Has patient had a PCN reaction occurring within the last 10 years: Yes If all of the above answers are "NO", then may proceed with Cephalosporin use. Has patient had a PCN reaction causing immediate rash, facial/tongue/throat swelling, SOB or lightheadedness with hypotension: Yes Has patient had a PCN reaction causing severe rash involving mucus membranes or skin necrosis: Yes Has patient had a PCN reaction that required hospitalization Yes Has patient had a PCN reaction occurring within the last 10 years: Yes If all of the above answers are "NO", then may proceed with Cephalosporin use.  . Shellfish-Derived Products Hives  . Shrimp [Shellfish  Allergy] Hives  . Sulfa Antibiotics Rash     Current Outpatient Medications  Medication Sig Dispense Refill  . colchicine 0.6 MG tablet TAKE 1 TABLET (0.6 MG TOTAL) BY MOUTH EVERY 8 (EIGHT) HOURS AS NEEDED (FOR GOUTY ATTACKS). 30 tablet 3  . febuxostat (ULORIC) 40 MG tablet TAKE 2 TABLETS(80 MG) BY MOUTH DAILY 60 tablet 1  . gabapentin (NEURONTIN) 600 MG tablet Take 1 tablet (600 mg total) by mouth 3 (three) times daily. 90 tablet 11  . ibuprofen (ADVIL) 800 MG tablet TAKE 1 TABLET BY MOUTH EVERY 8 HOURS AS NEEDED FOR MODERATE PAIN. EAT BEFORE TAKING MEDICATION 60 tablet 0  . meloxicam (MOBIC) 15 MG tablet Take 1 tablet (15 mg total) by mouth daily. 30 tablet 0  . metoprolol succinate (TOPROL XL) 25 MG 24 hr tablet Take 1 tablet (25 mg total) by mouth daily. 90 tablet 3  . mupirocin ointment (BACTROBAN) 2 % Apply to affected skin areas twice daily x5 days then as needed 22 g 3  . ondansetron (ZOFRAN) 4 MG tablet TAKE 1 TABLET(4 MG) BY MOUTH EVERY 8 HOURS AS NEEDED FOR NAUSEA OR VOMITING 20 tablet 0  . triamcinolone cream (KENALOG) 0.1 % Apply 1 application topically 2 (two) times daily. 456.6 g 0   No current facility-administered medications for this visit.     Past Medical History:  Diagnosis Date  . Allergy   . Anemia    receives transfusions periodically  . Anxiety   . Arrhythmia   . Chronic headache   . Depression   . Dysrhythmia   . Gout 12/2018  .  Knee pain   . Nearsightedness    wears glasses  . Neuropathy   . Obesity   . Pneumonia   . Pre-diabetes   . Recurrent boils   . WPW (Wolff-Parkinson-White syndrome)     ROS:   All systems reviewed and negative except as noted in the HPI.   Past Surgical History:  Procedure Laterality Date  . ADJACENT TISSUE TRANSFER/TISSUE REARRANGEMENT Left 07/28/2019   Procedure: ADJACENT TISSUE TRANSFER TO LEFT AXILLA GREATER THAN 100 CM SQUARED;  Surgeon: Irene Limbo, MD;  Location: [facility];  Service: Plastics;  Laterality:  Left;  . HYDRADENITIS EXCISION Left 07/28/2019   Procedure: EXCISION LEFT HIDRADENITIS AXILLA;  Surgeon: Clovis Riley, MD;  Location: [facility];  Service: General;  Laterality: Left;  . TONSILLECTOMY       Family History  Problem Relation Age of Onset  . Breast cancer Mother   . Pulmonary embolism Mother        died of PE  . Colon cancer Mother   . Irritable bowel syndrome Mother   . Cancer Mother   . Hypertension Father   . Diabetes Paternal Grandmother   . Heart disease Neg Hx   . Stroke Neg Hx      Social History   Socioeconomic History  . Marital status: Single    Spouse name: Not on file  . Number of children: Not on file  . Years of education: Not on file  . Highest education level: Not on file  Occupational History  . Not on file  Tobacco Use  . Smoking status: Former Smoker    Years: 0.50    Types: Cigarettes, Cigars    Quit date: 04/30/2019    Years since quitting: 1.1  . Smokeless tobacco: Never Used  . Tobacco comment: smokes black and milds - last use early-mid August  Vaping Use  . Vaping Use: Never used  Substance and Sexual Activity  . Alcohol use: No  . Drug use: Yes    Types: Marijuana    Comment: 2+ times per month  . Sexual activity: Yes    Birth control/protection: None  Other Topics Concern  . Not on file  Social History Narrative  . Not on file   Social Determinants of Health   Financial Resource Strain: Not on file  Food Insecurity: Not on file  Transportation Needs: Not on file  Physical Activity: Not on file  Stress: Not on file  Social Connections: Not on file  Intimate Partner Violence: Not on file     BP 106/68   Pulse (!) 111   Ht 5\' 9"  (1.753 m)   Wt 292 lb 9.6 oz (132.7 kg)   SpO2 97%   BMI 43.21 kg/m   Physical Exam:  Well appearing 40 yo woman, NAD HEENT: Unremarkable Neck:  7 cm JVD, no thyromegally Lymphatics:  No adenopathy Back:  No CVA tenderness Lungs:  Clear with no wheezes HEART:  Regular rate  rhythm, no murmurs, no rubs, no clicks Abd:  soft, positive bowel sounds, no organomegally, no rebound, no guarding Ext:  2 plus pulses, no edema, no cyanosis, no clubbing Skin:  No rashes no nodules Neuro:  CN II through XII intact, motor grossly intact  EKG - nsr with no pre-excitation  Assess/Plan: 1. WPW/SVT - she is minimally symptomatic. She will restart her beta blocker. I discussed catheter ablation but she does not appear to be symptomatic enough and is not interested in pursuing ablation at this  time. 2. HTN - hopefully the beta blocker will help.   Carleene Overlie Ajani Rineer,MD

## 2020-06-17 NOTE — Patient Instructions (Addendum)
Medication Instructions:  Your physician has recommended you make the following change in your medication:   1.  START taking metoprolol succinate 25 mg-  Take one tablet by mouth daily   Labwork: None ordered.  Testing/Procedures: None ordered.  Follow-Up: Your physician wants you to follow-up in: 6 months with Cristopher Peru, MD or one of the following Advanced Practice Providers on your designated Care Team:    Chanetta Marshall, NP  Tommye Standard, PA-C  Legrand Como "Jonni Sanger" Chalmers Cater, [place]   Any Other Special Instructions Will Be Listed Below (If Applicable).  If you need a refill on your cardiac medications before your next appointment, please call your pharmacy.   Metoprolol Extended-Release Tablets What is this medicine? METOPROLOL (me TOE proe lole) is a beta blocker. It decreases the amount of work your heart has to do and helps your heart beat regularly. It treats high blood pressure and/or prevent chest pain (also called angina). It also treats heart failure. This medicine may be used for other purposes; ask your health care provider or pharmacist if you have questions. COMMON BRAND NAME(S): toprol, Toprol XL What should I tell my health care provider before I take this medicine? They need to know if you have any of these conditions:  diabetes  heart or vessel disease like slow heart rate, worsening heart failure, heart block, sick sinus syndrome or Raynaud's disease  kidney disease  liver disease  lung or breathing disease, like asthma or emphysema  pheochromocytoma  thyroid disease  an unusual or allergic reaction to metoprolol, other beta-blockers, medicines, foods, dyes, or preservatives  pregnant or trying to get pregnant  breast-feeding How should I use this medicine? Take this drug by mouth. Take it as directed on the prescription label at the same time every day. Take it with food. You may cut the tablet in half if it is scored (has a line in the middle of it).  This may help you swallow the tablet if the whole tablet is too big. Be sure to take both halves. Do not take just one-half of the tablet. Keep taking it unless your health care provider tells you to stop. Talk to your health care provider about the use of this drug in children. While it may be prescribed for children as young as 6 for selected conditions, precautions do apply. Overdosage: If you think you have taken too much of this medicine contact a poison control center or emergency room at once. NOTE: This medicine is only for you. Do not share this medicine with others. What if I miss a dose? If you miss a dose, take it as soon as you can. If it is almost time for your next dose, take only that dose. Do not take double or extra doses. What may interact with this medicine? This medicine may interact with the following medications:  certain medicines for blood pressure, heart disease, irregular heart beat  certain medicines for depression, like monoamine oxidase (MAO) inhibitors, fluoxetine, or paroxetine  clonidine  dobutamine  epinephrine  isoproterenol  reserpine This list may not describe all possible interactions. Give your health care provider a list of all the medicines, herbs, non-prescription drugs, or dietary supplements you use. Also tell them if you smoke, drink alcohol, or use illegal drugs. Some items may interact with your medicine. What should I watch for while using this medicine? Visit your doctor or health care professional for regular check ups. Contact your doctor right away if your symptoms worsen. Check your  blood pressure and pulse rate regularly. Ask your health care professional what your blood pressure and pulse rate should be, and when you should contact them. You may get drowsy or dizzy. Do not drive, use machinery, or do anything that needs mental alertness until you know how this medicine affects you. Do not sit or stand up quickly, especially if you are an  older patient. This reduces the risk of dizzy or fainting spells. Contact your doctor if these symptoms continue. Alcohol may interfere with the effect of this medicine. Avoid alcoholic drinks. This medicine may increase blood sugar. Ask your healthcare provider if changes in diet or medicines are needed if you have diabetes. What side effects may I notice from receiving this medicine? Side effects that you should report to your doctor or health care professional as soon as possible:  allergic reactions like skin rash, itching or hives  cold or numb hands or feet  depression  difficulty breathing  faint  fever with sore throat  irregular heartbeat, chest pain  rapid weight gain  signs and symptoms of high blood sugar such as being more thirsty or hungry or having to urinate more than normal. You may also feel very tired or have blurry vision.  swollen legs or ankles Side effects that usually do not require medical attention (report to your doctor or health care professional if they continue or are bothersome):  anxiety or nervousness  change in sex drive or performance  dry skin  headache  nightmares or trouble sleeping  short term memory loss  stomach upset or diarrhea This list may not describe all possible side effects. Call your doctor for medical advice about side effects. You may report side effects to FDA at 1-800-FDA-1088. Where should I keep my medicine? Keep out of the reach of children and pets. Store at room temperature between 20 and 25 degrees C (68 and 77 degrees F). Throw away any unused drug after the expiration date. NOTE: This sheet is a summary. It may not cover all possible information. If you have questions about this medicine, talk to your doctor, pharmacist, or health care provider.  2021 [employer] (2018-11-03 18:23:00)

## 2020-06-18 ENCOUNTER — Other Ambulatory Visit: Payer: Medicare Other

## 2020-06-18 ENCOUNTER — Ambulatory Visit: Payer: Medicare Other | Admitting: Hematology

## 2020-08-20 ENCOUNTER — Ambulatory Visit: Payer: Medicare Other | Admitting: Hematology

## 2020-08-20 ENCOUNTER — Other Ambulatory Visit: Payer: Medicare Other

## 2020-08-26 NOTE — Progress Notes (Signed)
HEMATOLOGY/ONCOLOGY CLINIC NOTE  Date of Service: 08/27/2020  PCP [facility] (Gout management) Gennette Pac MD Cardiology - [facility] [place]  CHIEF COMPLAINTS/PURPOSE OF CONSULTATION:  Thrombocytosis Severe iron deficiency anemia  HISTORY OF PRESENTING ILLNESS:  Amanda Davenport is a wonderful 40 y.o. female who has been referred to [place] for evaluation and management of thrombocytosis.  She was last seen by [place] in 2016 and was lost to follow-up due to issues with her medical insurance.  Patient has been referred back to [place] for evaluation of her intermittent thrombocytosis and severe iron deficiency anemia. The pt reports her hidradenitis suppurativa has been quite bothersome and she had surgery on 4/23-- left HS excision--still has an open surgical wound with some oozing .  Had blood loss during surgery as well.  Patient notes she has had significant issues with severe gout attacks- prednisone, allopurinol/colchicine/febostat.  She is following with Dr.Khiem Gardiner Sleeper MD for her rheumatology cares.  She continues to have heavy menstrual periods lasting 5 days of which 4 are very heavy. Has been taking Geritol  1 month and also has continued taking prenatal vitamins No personal history of VTE No FHx of VTE, bleeding or clotting disorders but mother had PE in the setting of surgery and active breast cancer. Quit smoking tobacco- 07/2019. 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  On review of systems, pt reports severe fatigue and some ice cravings.  No fevers chills night sweats or new bone pains.  INTERVAL HISTORY:  Amanda Davenport is a wonderful 40 y.o. female who is here for evaluation and management of  thrombocytosis. The patient's last visit with [place] was on 04/17/2020. The pt reports that she is doing well overall.  The pt reports that she has been on steroids recently but is now off. She continues to struggle with her legs swollen and is being seen by a rheumatologist regarding her gout that they believe may be RA. She is supposed to get a MRI of her leg but has been delaying this she notes. She has been eating and drinking well with no bleeding issues. She is on Cabergoline that is controlling her periods. The pt notes she has not been taking her B12 nor B Complex regularly.   Lab results today 08/27/2020 of CBC w/diff is as follows: all values are WNL except for WBC of 10.9K, Hgb of 11.2, HCT of 35.5, RDW of 15.8, Plt of 490K, Neutro Abs of 9.2K. 08/27/2020 Vitamin B12 in progress. 08/27/2020 Iron pending. 08/27/2020 Ferritin pending.  On review of systems, pt reports fatigue and denies SOB, chest pain, abdominal pain, back pain, leg swelling, and any other symptoms.  MEDICAL HISTORY:  Past Medical History:  Diagnosis Date  . Allergy   . Anemia    receives transfusions periodically  . Anxiety   . Arrhythmia   . Chronic headache   . Depression   . Dysrhythmia   . Gout 12/2018  . Knee pain   . Nearsightedness    wears glasses  . Neuropathy   . Obesity   . Pneumonia   . Pre-diabetes   . Recurrent boils   . WPW (Wolff-Parkinson-White syndrome)  SURGICAL HISTORY: Past Surgical History:  Procedure Laterality Date  . ADJACENT TISSUE TRANSFER/TISSUE REARRANGEMENT Left 07/28/2019   Procedure: ADJACENT TISSUE TRANSFER TO LEFT AXILLA GREATER THAN 100 CM SQUARED;  Surgeon: Irene Limbo, MD;  Location: [facility];  Service: Plastics;  Laterality: Left;  . HYDRADENITIS EXCISION Left 07/28/2019   Procedure: EXCISION LEFT HIDRADENITIS AXILLA;  Surgeon: Clovis Riley, MD;  Location: [facility];  Service: General;  Laterality: Left;  . TONSILLECTOMY      SOCIAL HISTORY: Social  History   Socioeconomic History  . Marital status: Single    Spouse name: Not on file  . Number of children: Not on file  . Years of education: Not on file  . Highest education level: Not on file  Occupational History  . Not on file  Tobacco Use  . Smoking status: Former Smoker    Years: 0.50    Types: Cigarettes, Cigars    Quit date: 04/30/2019    Years since quitting: 1.3  . Smokeless tobacco: Never Used  . Tobacco comment: smokes black and milds - last use early-mid August  Vaping Use  . Vaping Use: Never used  Substance and Sexual Activity  . Alcohol use: No  . Drug use: Yes    Types: Marijuana    Comment: 2+ times per month  . Sexual activity: Yes    Birth control/protection: None  Other Topics Concern  . Not on file  Social History Narrative  . Not on file   Social Determinants of Health   Financial Resource Strain: Not on file  Food Insecurity: Not on file  Transportation Needs: Not on file  Physical Activity: Not on file  Stress: Not on file  Social Connections: Not on file  Intimate Partner Violence: Not on file    FAMILY HISTORY: Family History  Problem Relation Age of Onset  . Breast cancer Mother   . Pulmonary embolism Mother        died of PE  . Colon cancer Mother   . Irritable bowel syndrome Mother   . Cancer Mother   . Hypertension Father   . Diabetes Paternal Grandmother   . Heart disease Neg Hx   . Stroke Neg Hx     ALLERGIES:  is allergic to other, penicillins, shellfish-derived products, shrimp [shellfish allergy], and sulfa antibiotics.  MEDICATIONS:  Current Outpatient Medications  Medication Sig Dispense Refill  . colchicine 0.6 MG tablet TAKE 1 TABLET (0.6 MG TOTAL) BY MOUTH EVERY 8 (EIGHT) HOURS AS NEEDED (FOR GOUTY ATTACKS). 30 tablet 3  . febuxostat (ULORIC) 40 MG tablet TAKE 2 TABLETS(80 MG) BY MOUTH DAILY 60 tablet 1  . gabapentin (NEURONTIN) 600 MG tablet Take 1 tablet (600 mg total) by mouth 3 (three) times daily. 90  tablet 11  . ibuprofen (ADVIL) 800 MG tablet TAKE 1 TABLET BY MOUTH EVERY 8 HOURS AS NEEDED FOR MODERATE PAIN. EAT BEFORE TAKING MEDICATION 60 tablet 0  . meloxicam (MOBIC) 15 MG tablet Take 1 tablet (15 mg total) by mouth daily. 30 tablet 0  . metoprolol succinate (TOPROL XL) 25 MG 24 hr tablet Take 1 tablet (25 mg total) by mouth daily. 90 tablet 3  . mupirocin ointment (BACTROBAN) 2 % Apply to affected skin areas twice daily x5 days then as needed 22 g 3  . ondansetron (ZOFRAN) 4 MG tablet TAKE 1 TABLET(4 MG) BY MOUTH EVERY 8 HOURS AS NEEDED FOR NAUSEA OR VOMITING 20 tablet 0  . triamcinolone cream (KENALOG) 0.1 % Apply  1 application topically 2 (two) times daily. 456.6 g 0   No current facility-administered medications for this visit.    REVIEW OF SYSTEMS:   10 Point review of Systems was done is negative except as noted above.  PHYSICAL EXAMINATION: ECOG PERFORMANCE STATUS: 2 - Symptomatic, <50% confined to bed  . Vitals:   08/27/20 1441  BP: 127/73  Pulse: 85  Resp: 18  Temp: 98.1 F (36.7 C)  SpO2: 97%   Filed Weights   08/27/20 1441  Weight: 294 lb 11.2 oz (133.7 kg)   .Body mass index is 43.52 kg/m.   GENERAL:alert, in no acute distress and comfortable SKIN: no acute rashes, no significant lesions EYES: conjunctiva are pink and non-injected, sclera anicteric OROPHARYNX: MMM, no exudates, no oropharyngeal erythema or ulceration NECK: supple, no JVD LYMPH:  no palpable lymphadenopathy in the cervical, axillary or inguinal regions LUNGS: clear to auscultation b/l with normal respiratory effort HEART: regular rate & rhythm ABDOMEN:  normoactive bowel sounds , non tender, not distended. No palpable hepatosplenomegaly.  Extremity: no pedal edema PSYCH: alert & oriented x 3 with fluent speech NEURO: no focal motor/sensory deficits  LABORATORY DATA:  I have reviewed the data as listed  . CBC Latest Ref Rng & Units 08/27/2020 04/17/2020 01/31/2020  WBC 4.0 - 10.5  K/uL 10.9(H) 8.7 14.2(H)  Hemoglobin 12.0 - 15.0 g/dL 11.2(L) 11.0(L) 9.0(L)  Hematocrit 36.0 - 46.0 % 35.5(L) 36.1 30.3(L)  Platelets 150 - 400 K/uL 490(H) 469(H) 714(H)   . CBC    Component Value Date/Time   WBC 10.9 (H) 08/27/2020 1404   RBC 4.20 08/27/2020 1404   HGB 11.2 (L) 08/27/2020 1404   HGB 8.7 (L) 01/11/2020 1015   HGB 9.5 (L) 12/18/2014 1518   HCT 35.5 (L) 08/27/2020 1404   HCT 31.9 (L) 01/11/2020 1015   HCT 31.4 (L) 12/18/2014 1518   PLT 490 (H) 08/27/2020 1404   PLT 639 (H) 01/11/2020 1015   MCV 84.5 08/27/2020 1404   MCV 71 (L) 01/11/2020 1015   MCV 67.0 (L) 12/18/2014 1518   MCH 26.7 08/27/2020 1404   MCHC 31.5 08/27/2020 1404   RDW 15.8 (H) 08/27/2020 1404   RDW 18.6 (H) 01/11/2020 1015   RDW 26.0 (H) 12/18/2014 1518   LYMPHSABS 1.5 08/27/2020 1404   LYMPHSABS 2.0 01/11/2020 1015   LYMPHSABS 1.6 12/18/2014 1518   MONOABS 0.2 08/27/2020 1404   MONOABS 0.3 12/18/2014 1518   EOSABS 0.0 08/27/2020 1404   EOSABS 0.2 01/11/2020 1015   BASOSABS 0.0 08/27/2020 1404   BASOSABS 0.1 01/11/2020 1015   BASOSABS 0.0 12/18/2014 1518    . CMP Latest Ref Rng & Units 01/31/2020 12/06/2019 07/19/2019  Glucose 70 - 99 mg/dL 81 86 118(H)  BUN 6 - 20 mg/dL 11 12 9   Creatinine 0.44 - 1.00 mg/dL 0.73 0.86 0.94  Sodium 135 - 145 mmol/L 135 139 141  Potassium 3.5 - 5.1 mmol/L 3.5 4.3 3.6  Chloride 98 - 111 mmol/L 104 103 107  CO2 22 - 32 mmol/L 27 - 25  Calcium 8.9 - 10.3 mg/dL 8.7(L) 9.2 8.9  Total Protein 6.5 - 8.1 g/dL 7.7 8.5 -  Total Bilirubin 0.3 - 1.2 mg/dL 0.3 <0.2 -  Alkaline Phos 38 - 126 U/L 68 85 -  AST 15 - 41 U/L 11(L) 13 -  ALT 0 - 44 U/L 13 - -   . Lab Results  Component Value Date   IRON 16 (L) 08/27/2020   TIBC 328  08/27/2020   IRONPCTSAT 5 (L) 08/27/2020   (Iron and TIBC)  Lab Results  Component Value Date   FERRITIN 223 04/17/2020   . Lab Results  Component Value Date   IRON 16 (L) 08/27/2020   TIBC 328 08/27/2020   IRONPCTSAT 5 (L)  08/27/2020   (Iron and TIBC)  Lab Results  Component Value Date   FERRITIN 223 04/17/2020    B12 ---192    RADIOGRAPHIC STUDIES: I have personally reviewed the radiological images as listed and agreed with the findings in the report. No results found.  ASSESSMENT & PLAN:   40 year old African American female with  #1 Microcytic anemia - likely due to severe iron deficiency.  Unclear etiology possibly chronic hemorrhoidal bleeding versus heavy periods versus other GI losses (?NSAIDS associated ulceration). Additional  Elements causing anemia include chronic inflammatory state due to hydradenitis suppurativa and severe gout. Given the relatively high RBC numbers for the given hemoglobin cannot rule out underlying thalassemia/hemoglobinopathy.  #2 thrombocytosis This is likely reactive from severe iron deficiency anemia, surgery, previous smoking, inflammation from her hidradenitis suppurativa and gout. Given her age unlikely to be a myeloproliferative neoplasm like essential thrombocytosis.  PLAN: -Discussed pt labwork today, 08/27/2020; counts stable.  . Lab Results  Component Value Date   IRON 16 (L) 08/27/2020   TIBC 328 08/27/2020   IRONPCTSAT 5 (L) 08/27/2020   (Iron and TIBC)  Lab Results  Component Value Date   FERRITIN 29 08/27/2020   - will offer 1 additional dose of IV Injectafer -Advised pt to start back taking her Vitamins as prescribed.  -Recommended pt continue to f/u w PCP. -Will continue to f/u on [place] labs to determine if pt needs any further iron infusions. -Will see back in 11 months with labs.   #3 vitamin B12 deficiency -Replace with B12 1000 mcg sublingual daily   FOLLOW UP: RTC with Dr Irene Limbo with labs in 11 months   The total time spent in the appt was 20 minutes and more than 50% was on counseling and direct patient cares.  All of the patient's questions were answered with apparent satisfaction. The patient knows to call the clinic  with any problems, questions or concerns.    Sullivan Lone MD [place] AAHIVMS [facility] Hematology/Oncology Physician [facility]  (Office):       [phone] (Work cell):  [phone] (Fax):           [phone]  08/27/2020 3:01 PM  I, Reinaldo Raddle, am acting as scribe for Dr. Sullivan Lone, MD.   .I have reviewed the above documentation for accuracy and completeness, and I agree with the above. Brunetta Genera MD

## 2020-08-27 ENCOUNTER — Inpatient Hospital Stay (HOSPITAL_BASED_OUTPATIENT_CLINIC_OR_DEPARTMENT_OTHER): Payer: Medicare (Managed Care) | Admitting: Hematology

## 2020-08-27 ENCOUNTER — Inpatient Hospital Stay: Payer: Medicare (Managed Care) | Attending: Hematology

## 2020-08-27 ENCOUNTER — Other Ambulatory Visit: Payer: Self-pay

## 2020-08-27 VITALS — BP 127/73 | HR 85 | Temp 98.1°F | Resp 18 | Ht 69.0 in | Wt 294.7 lb

## 2020-08-27 DIAGNOSIS — E538 Deficiency of other specified B group vitamins: Secondary | ICD-10-CM

## 2020-08-27 DIAGNOSIS — N920 Excessive and frequent menstruation with regular cycle: Secondary | ICD-10-CM | POA: Diagnosis not present

## 2020-08-27 DIAGNOSIS — D509 Iron deficiency anemia, unspecified: Secondary | ICD-10-CM

## 2020-08-27 DIAGNOSIS — D75839 Thrombocytosis, unspecified: Secondary | ICD-10-CM | POA: Diagnosis present

## 2020-08-27 LAB — CBC WITH DIFFERENTIAL/PLATELET
Abs Immature Granulocytes: 0.02 10*3/uL (ref 0.00–0.07)
Basophils Absolute: 0 10*3/uL (ref 0.0–0.1)
Basophils Relative: 0 %
Eosinophils Absolute: 0 10*3/uL (ref 0.0–0.5)
Eosinophils Relative: 0 %
HCT: 35.5 % — ABNORMAL LOW (ref 36.0–46.0)
Hemoglobin: 11.2 g/dL — ABNORMAL LOW (ref 12.0–15.0)
Immature Granulocytes: 0 %
Lymphocytes Relative: 13 %
Lymphs Abs: 1.5 10*3/uL (ref 0.7–4.0)
MCH: 26.7 pg (ref 26.0–34.0)
MCHC: 31.5 g/dL (ref 30.0–36.0)
MCV: 84.5 fL (ref 80.0–100.0)
Monocytes Absolute: 0.2 10*3/uL (ref 0.1–1.0)
Monocytes Relative: 2 %
Neutro Abs: 9.2 10*3/uL — ABNORMAL HIGH (ref 1.7–7.7)
Neutrophils Relative %: 85 %
Platelets: 490 10*3/uL — ABNORMAL HIGH (ref 150–400)
RBC: 4.2 MIL/uL (ref 3.87–5.11)
RDW: 15.8 % — ABNORMAL HIGH (ref 11.5–15.5)
WBC: 10.9 10*3/uL — ABNORMAL HIGH (ref 4.0–10.5)
nRBC: 0 % (ref 0.0–0.2)

## 2020-08-27 LAB — IRON AND TIBC
Iron: 16 ug/dL — ABNORMAL LOW (ref 41–142)
Saturation Ratios: 5 % — ABNORMAL LOW (ref 21–57)
TIBC: 328 ug/dL (ref 236–444)
UIBC: 311 ug/dL (ref 120–384)

## 2020-08-27 LAB — VITAMIN B12: Vitamin B-12: 220 pg/mL (ref 180–914)

## 2020-08-27 LAB — FERRITIN: Ferritin: 29 ng/mL (ref 11–307)

## 2020-08-28 ENCOUNTER — Telehealth: Payer: Self-pay | Admitting: Hematology

## 2020-08-28 NOTE — Telephone Encounter (Signed)
Left message with follow-up appointment per 5/24 los. Gave option to call back to reschedule if needed.

## 2020-08-29 ENCOUNTER — Encounter: Payer: Self-pay | Admitting: Physical Therapy

## 2020-08-29 ENCOUNTER — Ambulatory Visit: Payer: Medicare (Managed Care) | Attending: Plastic Surgery | Admitting: Physical Therapy

## 2020-08-29 ENCOUNTER — Other Ambulatory Visit: Payer: Self-pay

## 2020-08-29 DIAGNOSIS — L732 Hidradenitis suppurativa: Secondary | ICD-10-CM | POA: Insufficient documentation

## 2020-08-29 DIAGNOSIS — Z48817 Encounter for surgical aftercare following surgery on the skin and subcutaneous tissue: Secondary | ICD-10-CM | POA: Diagnosis present

## 2020-08-29 DIAGNOSIS — M25612 Stiffness of left shoulder, not elsewhere classified: Secondary | ICD-10-CM | POA: Diagnosis present

## 2020-08-29 NOTE — Therapy (Signed)
[place], [place], [address] Phone: [phone]   Fax:  [phone]  Physical Therapy Evaluation  Patient Details  Name: Amanda Davenport MRN: [id number] Date of Birth: [birth year] Referring Provider (PT): Thimmappa   Encounter Date: 08/29/2020   PT End of Session - 08/29/20 1101    Visit Number 1    Number of Visits 5    Date for PT Re-Evaluation 10/03/20    PT Start Time 1004    PT Stop Time 1100    PT Time Calculation (min) 56 min    Activity Tolerance Patient tolerated treatment well    Behavior During Therapy [facility] for tasks assessed/performed           Past Medical History:  Diagnosis Date  . Allergy   . Anemia    receives transfusions periodically  . Anxiety   . Arrhythmia   . Chronic headache   . Depression   . Dysrhythmia   . Gout 12/2018  . Knee pain   . Nearsightedness    wears glasses  . Neuropathy   . Obesity   . Pneumonia   . Pre-diabetes   . Recurrent boils   . WPW (Wolff-Parkinson-White syndrome)     Past Surgical History:  Procedure Laterality Date  . ADJACENT TISSUE TRANSFER/TISSUE REARRANGEMENT Left 07/28/2019   Procedure: ADJACENT TISSUE TRANSFER TO LEFT AXILLA GREATER THAN 100 CM SQUARED;  Surgeon: Irene Limbo, MD;  Location: [facility];  Service: Plastics;  Laterality: Left;  . HYDRADENITIS EXCISION Left 07/28/2019   Procedure: EXCISION LEFT HIDRADENITIS AXILLA;  Surgeon: Clovis Riley, MD;  Location: [facility];  Service: General;  Laterality: Left;  . TONSILLECTOMY      There were no vitals filed for this visit.    Subjective Assessment - 08/29/20 1005    Subjective I had axillary surgery on my left arm and they took some of by back skin and made my armpit. It still needs to heal.    Pertinent History hidradentitis, anemia, neuropathy, Wolff-Parkinson-White - managed with medication, gout    Patient Stated Goals to get my ROM back in my L arm     Currently in Pain? Yes    Pain Score 9     Pain Location Axilla    Pain Orientation Right    Pain Descriptors / Indicators Burning;Tightness    Pain Type Acute pain    Pain Onset In the past 7 days    Pain Frequency Constant    Aggravating Factors  using the R arm    Pain Relieving Factors heat, ibuprophen    Effect of Pain on Daily Activities has to stay in bed, putting on clothes and fixing hair is so painful              [facility] PT Assessment - 08/29/20 0001      Assessment   Medical Diagnosis hidradentitis    Referring Provider (PT) Thimmappa    Onset Date/Surgical Date 07/28/19    Hand Dominance Right    Prior Therapy none      Precautions   Precautions None      Restrictions   Weight Bearing Restrictions No      Balance Screen   Has the patient fallen in the past 6 months No    Has the patient had a decrease in activity level because of a fear of falling?  No    Is the patient reluctant to leave their home because of  a fear of falling?  No      Home Environment   Living Environment Private residence    Living Arrangements Alone    Available Help at Discharge Family    Type of Freedom      Prior Function   Level of [place] On disability    Leisure pt does not currently exercise      Cognition   Overall Cognitive Status Within Functional Limits for tasks assessed      Observation/Other Assessments   Observations Open wounds in R axilla covered with bandages as a result of hidradentitis. Pt with healing skin graft under L axilla with 1 small opening with minimal exudate present. Thin scar tissue overlying entire L axilla.      Posture/Postural Control   Posture/Postural Control Postural limitations    Postural Limitations Rounded Shoulders;Forward head      ROM / Strength   AROM / PROM / Strength AROM      AROM   Overall AROM Comments R shoulder ROM not measured today due to flare up of hidradentitis on that side which  limits ROM and causes increased pain    AROM Assessment Site Shoulder    Left Shoulder Extension 39 Degrees    Left Shoulder Flexion 126 Degrees    Left Shoulder ABduction 100 Degrees    Left Shoulder Internal Rotation 61 Degrees    Left Shoulder External Rotation 83 Degrees                      Objective measurements completed on examination: See above findings.               PT Education - 08/29/20 1159    Education Details anatomy and physiology of lymphatic system in the event that her lymphatic system was disrupted with her surgery and to be aware of any swelling that may develop    Person(s) Educated Patient    Methods Explanation    Comprehension Verbalized understanding               PT Long Term Goals - 08/29/20 1201      PT LONG TERM GOAL #1   Title Pt will demonstrate 150 degrees of left shoulder flexion to allow pt to reach overhead.    Baseline 126    Time 5    Period Weeks    Status New    Target Date 10/03/20      PT LONG TERM GOAL #2   Title Pt will demonstrate 150 degrees of left shoulder abduction to allow her to reach out to the side.    Baseline 100    Time 5    Period Weeks    Status New    Target Date 10/03/20      PT LONG TERM GOAL #3   Title Pt will report no pain in forearm from possible cording to allow improved comfort.    Time 5    Period Weeks    Status New    Target Date 10/03/20      PT LONG TERM GOAL #4   Title Pt will be independent in a home exercise program for continued strengthening and stretching.    Time 5    Period Weeks    Status New    Target Date 10/03/20                  Plan - 08/29/20 1052  Clinical Impression Statement Pt presents to PT with decreased L axillary ROM following L axillary excision for hidradentitis with skin graft from back. Pt reports her sutures came out a few days later and she had wound dehissance. Pt had to let the wound heal from the inside out. Her surgery  was on 07/28/19 and she still has a small opening that is not quite healed yet. Her skin from the graft appears very fragile and thin. Reached out and sent an inbasket to the referring doctor to inquire about manual therapy since her skin is so fragile and she has an open wound. Issued AROM to pt today to begin. She is also limited with R shoulder ROM due to flare up of hidradentitis on that side as well. Pt would benefit from skilled PT services 1x/wk for 5 weeks for ROM exercises to improve L shoulder ROM and will increase this pending doctor approval for manual therapy.    Personal Factors and Comorbidities Comorbidity 3+    Comorbidities gout, hidradentitis, myalgia, myositis, Delorse Limber White    Examination-Activity Limitations Caring for Others;Carry;Dressing;Lift;Hygiene/Grooming;Bathing;Reach Overhead    Examination-Participation Restrictions Shop;Cleaning;Community Activity;Occupation;Dorita Sciara    Stability/Clinical Decision Making Evolving/Moderate complexity    Clinical Decision Making Moderate    Rehab Potential Good    PT Frequency 1x / week    PT Duration --   5 weeks   PT Treatment/Interventions ADLs/Self Care Home Management;Therapeutic exercise;Therapeutic activities;Patient/family education;Manual techniques;Scar mobilization;Passive range of motion;Taping    PT Next Visit Plan AAROM exercises, watch for healing wound in L axilla, PROM to L axilla    PT Home Exercise Plan post op breast exercises    Consulted and Agree with Plan of Care Patient           Patient will benefit from skilled therapeutic intervention in order to improve the following deficits and impairments:  Pain,Postural dysfunction,Impaired UE functional use,Impaired flexibility,Increased fascial restricitons,Decreased strength,Decreased range of motion,Decreased scar mobility  Visit Diagnosis: Stiffness of left shoulder, not elsewhere classified  Encounter for surgical aftercare following surgery  of skin or subcutaneous tissue  Hidradenitis axillaris     Problem List Patient Active Problem List   Diagnosis Date Noted  . Generalized abdominal pain 03/15/2019  . Neuropathy 12/27/2018  . Ovarian cyst 04/29/2018  . Facial cellulitis   . Otitis externa   . Dental abscess 11/27/2016  . 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 Absolute anemia 05/21/2015  . Neck strain 12/11/2014  . Hydradenitis 12/11/2014  . Dental caries 12/11/2014  . Constipation 12/03/2014  . Neck pain 12/03/2014  . Myalgia and myositis 11/27/2014  . 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 Morbid obesity ([place]) 11/27/2014  . Hematochezia 12/16/2012  . Wolff-Parkinson-White (WPW) syndrome 03/01/2012  . Iron deficiency anemia 02/16/2012  . Tachycardia 01/28/2012  . Hidradenitis suppurativa 08/18/2011  . AXILLARY ABSCESS 12/30/2006  . ANKLE PAIN 10/11/2006    Allyson Sabal [facility] 08/29/2020, 12:09 PM  [place], [place], [address] Phone: [phone]   Fax:  [phone]  Name: Amanda Davenport MRN: [id number] Date of Birth: 1980/08/20  Manus Gunning, PT 08/29/20 12:09 PM

## 2020-09-03 ENCOUNTER — Encounter: Payer: Self-pay | Admitting: Hematology

## 2020-09-13 ENCOUNTER — Telehealth: Payer: Self-pay | Admitting: Hematology

## 2020-09-13 ENCOUNTER — Telehealth: Payer: Self-pay

## 2020-09-13 NOTE — Telephone Encounter (Signed)
Sch per 6/10 sch msg, pt aware

## 2020-09-13 NOTE — Telephone Encounter (Signed)
Contacted pt per Dr Grier Mitts request. Informed pt: Let patient know her ferritin is 29 and iron sat is 5%--- would recommend 1additional dose of IV Injectafer. Please schedule if patient agreeable.   Pt agreeable to another dose of Injectafer. Will send scheduling a message to get pt scheduled. Informed pt scheduling would contact her. Pt verbalized understanding.

## 2020-09-16 ENCOUNTER — Encounter: Payer: Medicare (Managed Care) | Admitting: Physical Therapy

## 2020-09-23 ENCOUNTER — Inpatient Hospital Stay: Payer: Medicare (Managed Care) | Attending: Hematology

## 2020-09-23 ENCOUNTER — Other Ambulatory Visit: Payer: Self-pay

## 2020-09-23 VITALS — BP 125/88 | HR 79 | Temp 98.5°F | Resp 18

## 2020-09-23 DIAGNOSIS — D508 Other iron deficiency anemias: Secondary | ICD-10-CM

## 2020-09-23 DIAGNOSIS — D509 Iron deficiency anemia, unspecified: Secondary | ICD-10-CM | POA: Insufficient documentation

## 2020-09-23 MED ORDER — SODIUM CHLORIDE 0.9 % IV SOLN
Freq: Once | INTRAVENOUS | Status: AC
  Filled 2020-09-23: qty 250

## 2020-09-23 MED ORDER — SODIUM CHLORIDE 0.9 % IV SOLN
200.0000 mg | Freq: Once | INTRAVENOUS | Status: AC
  Administered 2020-09-23: 200 mg via INTRAVENOUS
  Filled 2020-09-23: qty 200

## 2020-09-23 NOTE — Patient Instructions (Signed)

## 2020-09-24 ENCOUNTER — Telehealth: Payer: Self-pay | Admitting: Hematology

## 2020-09-24 NOTE — Telephone Encounter (Signed)
Scheduled appts per 6/20 sch msg. Called pt, no answer. Left msg with appts dates and times.

## 2020-09-30 ENCOUNTER — Other Ambulatory Visit: Payer: Self-pay

## 2020-09-30 ENCOUNTER — Inpatient Hospital Stay: Payer: Medicare (Managed Care)

## 2020-09-30 ENCOUNTER — Encounter: Payer: Medicare (Managed Care) | Admitting: Physical Therapy

## 2020-09-30 VITALS — BP 107/70 | HR 95 | Temp 98.7°F | Resp 18

## 2020-09-30 DIAGNOSIS — D509 Iron deficiency anemia, unspecified: Secondary | ICD-10-CM | POA: Diagnosis not present

## 2020-09-30 DIAGNOSIS — D508 Other iron deficiency anemias: Secondary | ICD-10-CM

## 2020-09-30 MED ORDER — SODIUM CHLORIDE 0.9 % IV SOLN
200.0000 mg | Freq: Once | INTRAVENOUS | Status: AC
  Administered 2020-09-30: 200 mg via INTRAVENOUS
  Filled 2020-09-30: qty 200

## 2020-09-30 MED ORDER — SODIUM CHLORIDE 0.9 % IV SOLN
Freq: Once | INTRAVENOUS | Status: AC
  Filled 2020-09-30: qty 250

## 2020-10-07 ENCOUNTER — Other Ambulatory Visit: Payer: Self-pay

## 2020-10-07 ENCOUNTER — Encounter (HOSPITAL_COMMUNITY): Payer: Self-pay | Admitting: Emergency Medicine

## 2020-10-07 ENCOUNTER — Emergency Department (HOSPITAL_COMMUNITY)
Admission: EM | Admit: 2020-10-07 | Discharge: 2020-10-08 | Disposition: A | Discharge status: Home / Self Care | Payer: Medicare (Managed Care) | Attending: Emergency Medicine | Admitting: Emergency Medicine

## 2020-10-07 ENCOUNTER — Emergency Department (HOSPITAL_COMMUNITY): Payer: Medicare (Managed Care)

## 2020-10-07 DIAGNOSIS — M79601 Pain in right arm: Secondary | ICD-10-CM | POA: Insufficient documentation

## 2020-10-07 DIAGNOSIS — Z87891 Personal history of nicotine dependence: Secondary | ICD-10-CM | POA: Diagnosis not present

## 2020-10-07 DIAGNOSIS — M79604 Pain in right leg: Secondary | ICD-10-CM | POA: Diagnosis not present

## 2020-10-07 DIAGNOSIS — M542 Cervicalgia: Secondary | ICD-10-CM | POA: Insufficient documentation

## 2020-10-07 MED ORDER — ONDANSETRON 4 MG PO TBDP
8.0000 mg | ORAL_TABLET | Freq: Once | ORAL | Status: AC
Start: 2020-10-07 — End: 2020-10-07
  Administered 2020-10-07: 8 mg via ORAL
  Filled 2020-10-07: qty 2

## 2020-10-07 MED ORDER — HYDROMORPHONE HCL 1 MG/ML IJ SOLN
2.0000 mg | Freq: Once | INTRAMUSCULAR | Status: AC
  Administered 2020-10-07: 2 mg via INTRAVENOUS
  Filled 2020-10-07: qty 2

## 2020-10-07 MED ORDER — PREDNISONE 20 MG PO TABS
60.0000 mg | ORAL_TABLET | Freq: Once | ORAL | Status: AC
  Administered 2020-10-07: 60 mg via ORAL
  Filled 2020-10-07: qty 3

## 2020-10-07 MED ORDER — DIPHENHYDRAMINE HCL 25 MG PO CAPS
50.0000 mg | ORAL_CAPSULE | Freq: Once | ORAL | Status: AC
  Administered 2020-10-07: 50 mg via ORAL
  Filled 2020-10-07: qty 2

## 2020-10-07 MED ORDER — SULFAMETHOXAZOLE-TRIMETHOPRIM 800-160 MG PO TABS
1.0000 | ORAL_TABLET | Freq: Once | ORAL | Status: AC
  Administered 2020-10-07: 1 via ORAL
  Filled 2020-10-07: qty 1

## 2020-10-07 MED ORDER — OXYCODONE-ACETAMINOPHEN 5-325 MG PO TABS: 1.0000 | ORAL_TABLET | Freq: Four times a day (QID) | ORAL | 0 refills | Status: DC | PRN

## 2020-10-07 MED ORDER — PREDNISONE 20 MG PO TABS: 20.0000 mg | ORAL_TABLET | Freq: Two times a day (BID) | ORAL | 0 refills | Status: DC

## 2020-10-07 NOTE — ED Provider Notes (Signed)
[facility] Provider Note   CSN: [id number] Arrival date & time: 10/07/20  1647     History Chief Complaint  Patient presents with   Extremity Pain    Amanda Davenport is a 40 y.o. female.  HPI She presents for evaluation of pain in her right neck rating to her right arm, and the right leg, for 2 weeks.  No recent trauma.  Previous similar pain with neuropathy.  She takes gabapentin regularly.  She denies trauma.  She is interested in having an x-ray done of her right shoulder to see if there is "any damage."  She has been treated by a rheumatologist for arthritis, affecting the right knee, with steroids about 2 weeks ago.  She states she took them sporadically at that time.  She has had other episodes of arthritis requiring prednisone in the last 6 months.  She denies fever, chills, cough, shortness of breath, weakness or dizziness.  No other known active modifying factors.    Past Medical History:  Diagnosis Date   Allergy    Anemia    receives transfusions periodically   Anxiety    Arrhythmia    Chronic headache    Depression    Dysrhythmia    Gout 12/2018   Knee pain    Nearsightedness    wears glasses   Neuropathy    Obesity    Pneumonia    Pre-diabetes    Recurrent boils    WPW (Wolff-Parkinson-White syndrome)     Patient Active Problem List   Diagnosis Date Noted   Generalized abdominal pain 03/15/2019   Neuropathy 12/27/2018   Ovarian cyst 04/29/2018   Facial cellulitis    Otitis externa    Dental abscess 11/27/2016   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   Absolute anemia 05/21/2015   Neck strain 12/11/2014   Hydradenitis 12/11/2014   Dental caries 12/11/2014   Constipation 12/03/2014   Neck pain 12/03/2014   Myalgia and myositis 11/27/2014   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   Morbid obesity ([place]) 11/27/2014   Hematochezia 12/16/2012   Wolff-Parkinson-White (WPW) syndrome 03/01/2012   Iron deficiency  anemia 02/16/2012   Tachycardia 01/28/2012   Hidradenitis suppurativa 08/18/2011   AXILLARY ABSCESS 12/30/2006   ANKLE PAIN 10/11/2006    Past Surgical History:  Procedure Laterality Date   ADJACENT TISSUE TRANSFER/TISSUE REARRANGEMENT Left 07/28/2019   Procedure: ADJACENT TISSUE TRANSFER TO LEFT AXILLA GREATER THAN 100 CM SQUARED;  Surgeon: Irene Limbo, MD;  Location: [facility];  Service: Plastics;  Laterality: Left;   HYDRADENITIS EXCISION Left 07/28/2019   Procedure: EXCISION LEFT HIDRADENITIS AXILLA;  Surgeon: Clovis Riley, MD;  Location: [facility];  Service: General;  Laterality: Left;   TONSILLECTOMY       OB History     Gravida  0   Para      Term      Preterm      AB      Living         SAB      IAB      Ectopic      Multiple      Live Births              Family History  Problem Relation Age of Onset   Breast cancer Mother    Pulmonary embolism Mother        died of PE   Colon cancer Mother    Irritable bowel syndrome Mother  Cancer Mother    Hypertension Father    Diabetes Paternal Grandmother    Heart disease Neg Hx    Stroke Neg Hx     Social History   Tobacco Use   Smoking status: Former    Years: 0.50    Pack years: 0.00    Types: Cigarettes, Cigars    Quit date: 04/30/2019    Years since quitting: 1.4   Smokeless tobacco: Never   Tobacco comments:    smokes black and milds - last use early-mid August  Vaping Use   Vaping Use: Never used  Substance Use Topics   Alcohol use: No   Drug use: Yes    Types: Marijuana    Comment: 2+ times per month    Home Medications Prior to Admission medications   Medication Sig Start Date End Date Taking? Authorizing Provider  oxyCODONE-acetaminophen (PERCOCET/ROXICET) 5-325 MG tablet Take 1 tablet by mouth every 6 (six) hours as needed for severe pain. 10/07/20  Yes Daleen Bo, MD  predniSONE (DELTASONE) 20 MG tablet Take 1 tablet (20 mg total) by mouth 2 (two) times daily. 10/07/20   Yes Daleen Bo, MD  colchicine 0.6 MG tablet TAKE 1 TABLET (0.6 MG TOTAL) BY MOUTH EVERY 8 (EIGHT) HOURS AS NEEDED (FOR GOUTY ATTACKS). 11/20/19   Gildardo Pounds, NP  febuxostat (ULORIC) 40 MG tablet TAKE 2 TABLETS(80 MG) BY MOUTH DAILY 05/16/20   Vevelyn Francois, NP  gabapentin (NEURONTIN) 600 MG tablet Take 1 tablet (600 mg total) by mouth 3 (three) times daily. 12/28/19 12/27/20  Vevelyn Francois, NP  ibuprofen (ADVIL) 800 MG tablet TAKE 1 TABLET BY MOUTH EVERY 8 HOURS AS NEEDED FOR MODERATE PAIN. EAT BEFORE TAKING MEDICATION 03/14/20   Charlott Rakes, MD  meloxicam (MOBIC) 15 MG tablet Take 1 tablet (15 mg total) by mouth daily. 02/07/20   Vevelyn Francois, NP  metoprolol succinate (TOPROL XL) 25 MG 24 hr tablet Take 1 tablet (25 mg total) by mouth daily. 06/17/20   Evans Lance, MD  mupirocin ointment (BACTROBAN) 2 % Apply to affected skin areas twice daily x5 days then as needed 06/28/19   Fulp, Cammie, MD  ondansetron (ZOFRAN) 4 MG tablet TAKE 1 TABLET(4 MG) BY MOUTH EVERY 8 HOURS AS NEEDED FOR NAUSEA OR VOMITING 04/02/20   Azzie Glatter, FNP  triamcinolone cream (KENALOG) 0.1 % Apply 1 application topically 2 (two) times daily. 12/06/19   Vevelyn Francois, NP    Allergies    Other, Penicillins, Shellfish-derived products, Shrimp [shellfish allergy], and Sulfa antibiotics  Review of Systems   Review of Systems  All other systems reviewed and are negative.  Physical Exam Updated Vital Signs BP 115/74   Pulse 89   Temp 98.6 F (37 C) (Oral)   Resp 16   LMP  (LMP Unknown)   SpO2 95%   Physical Exam Vitals and nursing note reviewed.  Constitutional:      General: She is in acute distress (She is uncomfortable).     Appearance: She is well-developed. She is obese. She is not ill-appearing or diaphoretic.  HENT:     Head: Normocephalic and atraumatic.     Right Ear: External ear normal.     Left Ear: External ear normal.  Eyes:     Conjunctiva/sclera: Conjunctivae normal.      Pupils: Pupils are equal, round, and reactive to light.  Neck:     Trachea: Phonation normal.  Cardiovascular:     Rate  and Rhythm: Normal rate.  Pulmonary:     Effort: Pulmonary effort is normal.  Abdominal:     General: There is no distension.  Musculoskeletal:        General: Normal range of motion.     Cervical back: Normal range of motion and neck supple.     Comments: She guards against moving the right neck and right shoulder secondary to pain.  No tenderness of the right leg to palpation.  Right shoulder is tender anteriorly, but there is no deformity.  Neck is tender right posterior, diffusely.  No palpable deformity of the neck.  Skin:    General: Skin is warm and dry.  Neurological:     Mental Status: She is alert and oriented to person, place, and time.     Cranial Nerves: No cranial nerve deficit.     Sensory: No sensory deficit.     Motor: No abnormal muscle tone.     Coordination: Coordination normal.  Psychiatric:        Mood and Affect: Mood normal.        Behavior: Behavior normal.        Thought Content: Thought content normal.        Judgment: Judgment normal.    ED Results / Procedures / Treatments   Labs (all labs ordered are listed, but only abnormal results are displayed) Labs Reviewed - No data to display  EKG None  Radiology DG Shoulder Right  Result Date: 10/07/2020 CLINICAL DATA:  Pain.  Right shoulder. EXAM: RIGHT SHOULDER - 2+ VIEW COMPARISON:  Chest x-ray 06/01/2019 FINDINGS: There is no evidence of fracture or dislocation. There is no evidence of arthropathy or other focal bone abnormality. Soft tissues are unremarkable. IMPRESSION: Negative. Electronically Signed   By: Iven Finn M.D.   On: 10/07/2020 22:36    Procedures Procedures   Medications Ordered in ED Medications  diphenhydrAMINE (BENADRYL) capsule 50 mg (has no administration in time range)  ondansetron (ZOFRAN-ODT) disintegrating tablet 8 mg (8 mg Oral Given 10/07/20 2211)   HYDROmorphone (DILAUDID) injection 2 mg (2 mg Intravenous Given 10/07/20 2207)  predniSONE (DELTASONE) tablet 60 mg (60 mg Oral Given 10/07/20 2209)  sulfamethoxazole-trimethoprim (BACTRIM DS) 800-160 MG per tablet 1 tablet (1 tablet Oral Given 10/07/20 2252)    ED Course  I have reviewed the triage vital signs and the nursing notes.  Pertinent labs & imaging results that were available during my care of the patient were reviewed by me and considered in my medical decision making (see chart for details).    MDM Rules/Calculators/A&P                           Patient Vitals for the past 24 hrs:  BP Temp Temp src Pulse Resp SpO2  10/07/20 2306 115/74 98.6 F (37 C) Oral 89 16 95 %  10/07/20 2056 111/81 -- -- 91 19 100 %  10/07/20 1704 117/72 98.4 F (36.9 C) -- 98 16 99 %    11:22 PM Reevaluation with update and discussion. After initial assessment and treatment, an updated evaluation reveals patient was inadvertently given a dose of Septra DS.  She has a sulfa allergy that causes a rash.  I apologized to the patient and she seemed to understand.  She was given a dose of Benadryl to counteract improvement in allergic reaction.  She is already received prednisone as well.  I discussed all the findings with the  patient.  She felt like she was improved and able to go home.  She acknowledges a prior history of gout and another type of arthritis which she cannot recall.Daleen Bo   Medical Decision Making:  This patient is presenting for evaluation of pain of neck, right arm and right leg, which does require a range of treatment options, and is a complaint that involves a moderate risk of morbidity and mortality. The differential diagnoses include rheumatoid arthritis, muscle strain, neuropathy, nerve impingement. I decided to review old records, and in summary middle-aged female presenting with atraumatic pain for 2 weeks.  Recent treatment for rheumatoid arthritis with prednisone by her  report.  I did not require additional historical information from anyone.  Radiologic Tests Ordered, included right shoulder right.  I independently Visualized: Radiography images, which show no acute abnormality    Critical Interventions-clinical evaluation, radiography, medication treatment, observation reassessment  After These Interventions, the Patient was reevaluated and was found more comfortable stable for discharge.  She received a advertent dose of Septra and was treated with Benadryl to prevent a reaction.  Her pain, in neck, right shoulder and right leg is nonspecific and may be related to neuropathy, muscle spasm or nerve impingement.  Doubt spinal disorder, or intracranial abnormality.  I do not think she is at risk for significant allergic reaction.  Patient is discharged in stable condition.  Prescription sent to her pharmacy for Percocet and prednisone.  CRITICAL CARE-no Performed by: Daleen Bo  Nursing Notes Reviewed/ Care Coordinated Applicable Imaging Reviewed Interpretation of Laboratory Data incorporated into ED treatment  The patient appears reasonably screened and/or stabilized for discharge and I doubt any other medical condition or other [facility] requiring further screening, evaluation, or treatment in the ED at this time prior to discharge.  Plan: Home Medications-continue usual; Home Treatments-rest, fluids; return here if the recommended treatment, does not improve the symptoms; Recommended follow up-PCP, as needed, return here if needed for problems.     Final Clinical Impression(s) / ED Diagnoses Final diagnoses:  Right arm pain  Neck pain  Right leg pain    Rx / DC Orders ED Discharge Orders          Ordered    oxyCODONE-acetaminophen (PERCOCET/ROXICET) 5-325 MG tablet  Every 6 hours PRN        10/07/20 2328    predniSONE (DELTASONE) 20 MG tablet  2 times daily        10/07/20 2328             Daleen Bo, MD 10/07/20 2329

## 2020-10-07 NOTE — ED Provider Notes (Signed)
Emergency Medicine Provider Triage Evaluation Note  Amanda Davenport , a 40 y.o. female  was evaluated in triage.  Pt complains of right sided neck and arm pain and right leg pain.  Patient reports for the past 1.5 weeks she has noticed pain and tightness in the right side of her neck and shoulder blade that radiates down her arm.  No associated numbness or weakness.  She reports about a week ago she started having intermittent pains going down her right leg but seem to be worse with certain movements.  No inciting injury or trauma.  No similar symptoms on the left side.  Has been taking ibuprofen and gabapentin which helps temporarily but does not resolve symptoms  Review of Systems  Positive: Arm pain, leg pain, neck pain Negative: Numbness, weakness  Physical Exam  BP 117/72   Pulse 98   Temp 98.4 F (36.9 C)   Resp 16   LMP  (LMP Unknown)   SpO2 99%  Gen:   Awake, no distress   Resp:  Normal effort  MSK:   No midline C-spine tenderness, normal strength and sensation in the right arm and leg. Other:    Medical Decision Making  Medically screening exam initiated at 5:10 PM.  Appropriate orders placed.  [name] was informed that the remainder of the evaluation will be completed by another provider, this initial triage assessment does not replace that evaluation, and the importance of remaining in the ED until their evaluation is complete.     Jacqlyn Larsen, PA-C 10/07/20 1714    Daleen Bo, MD 10/07/20 [phone]

## 2020-10-07 NOTE — ED Triage Notes (Signed)
Pt c/o pain to right neck that radiates down her right arm x 1.5 weeks and right leg pain x 1 week. Denies fall/injury.

## 2020-10-07 NOTE — Discharge Instructions (Addendum)
Use heat on sore areas 3-4 times a day.  Do not drive or drink alcohol when taking the narcotic pain reliever.  If you have itching or rash, take Benadryl every 4 hours.  Get the prescriptions from the pharmacy, tomorrow morning and start taking them.  Return here if needed.

## 2020-10-08 ENCOUNTER — Telehealth: Payer: Self-pay | Admitting: *Deleted

## 2020-10-08 ENCOUNTER — Ambulatory Visit: Payer: Medicare (Managed Care) | Admitting: Physical Therapy

## 2020-10-08 ENCOUNTER — Inpatient Hospital Stay: Payer: Medicare (Managed Care)

## 2020-10-08 NOTE — Telephone Encounter (Signed)
Received Telephone Advice fax from after hours [facility]: Call received by [facility] 10/08/20 @ 0146. Fax timed 10/08/20 @ 0147 AM. Patient asked to reschedule her Iron Infusion Contacted patient - LVM: Patient has 2 upcoming appts for iron infusion - 7/11 and 7/18. Requested she contact office with date r/s needed.

## 2020-10-08 NOTE — ED Notes (Signed)
Dc instructions reviewed with pt. PT verbalized understanding. PT DC 

## 2020-10-09 ENCOUNTER — Telehealth: Payer: Self-pay | Admitting: Hematology

## 2020-10-09 NOTE — Telephone Encounter (Signed)
Called pt to r/s appt per 7/6 sch msg. No answer. Left msg for pt to call back to r/s.

## 2020-10-14 ENCOUNTER — Inpatient Hospital Stay: Payer: Medicare (Managed Care) | Attending: Hematology

## 2020-10-14 ENCOUNTER — Other Ambulatory Visit: Payer: Self-pay

## 2020-10-14 VITALS — BP 104/67 | HR 93 | Temp 98.0°F | Resp 18

## 2020-10-14 DIAGNOSIS — D509 Iron deficiency anemia, unspecified: Secondary | ICD-10-CM | POA: Insufficient documentation

## 2020-10-14 DIAGNOSIS — D508 Other iron deficiency anemias: Secondary | ICD-10-CM

## 2020-10-14 MED ORDER — SODIUM CHLORIDE 0.9 % IV SOLN
200.0000 mg | Freq: Once | INTRAVENOUS | Status: AC
  Administered 2020-10-14: 200 mg via INTRAVENOUS
  Filled 2020-10-14: qty 200

## 2020-10-14 MED ORDER — SODIUM CHLORIDE 0.9 % IV SOLN
Freq: Once | INTRAVENOUS | Status: AC
  Filled 2020-10-14: qty 250

## 2020-10-14 NOTE — Patient Instructions (Signed)

## 2020-10-17 ENCOUNTER — Encounter: Payer: Self-pay | Admitting: Hematology

## 2020-10-21 ENCOUNTER — Inpatient Hospital Stay: Payer: Medicare (Managed Care)

## 2020-10-21 ENCOUNTER — Other Ambulatory Visit: Payer: Self-pay

## 2020-10-21 VITALS — BP 107/65 | HR 96 | Temp 98.8°F | Resp 18

## 2020-10-21 DIAGNOSIS — D508 Other iron deficiency anemias: Secondary | ICD-10-CM

## 2020-10-21 DIAGNOSIS — D509 Iron deficiency anemia, unspecified: Secondary | ICD-10-CM | POA: Diagnosis not present

## 2020-10-21 MED ORDER — SODIUM CHLORIDE 0.9 % IV SOLN
Freq: Once | INTRAVENOUS | Status: AC
  Filled 2020-10-21: qty 250

## 2020-10-21 MED ORDER — SODIUM CHLORIDE 0.9 % IV SOLN
200.0000 mg | Freq: Once | INTRAVENOUS | Status: AC
  Administered 2020-10-21: 200 mg via INTRAVENOUS
  Filled 2020-10-21: qty 200

## 2020-10-21 NOTE — Patient Instructions (Signed)

## 2020-10-21 NOTE — Progress Notes (Signed)
Patient refused 30 minute post observation iron infusion. Discharged in stable condition/

## 2020-11-22 ENCOUNTER — Encounter (HOSPITAL_COMMUNITY): Payer: Self-pay | Admitting: Emergency Medicine

## 2020-11-22 ENCOUNTER — Encounter: Payer: Self-pay | Admitting: Hematology

## 2020-11-22 ENCOUNTER — Emergency Department (HOSPITAL_COMMUNITY): Payer: Medicare (Managed Care)

## 2020-11-22 ENCOUNTER — Emergency Department (HOSPITAL_COMMUNITY)
Admission: EM | Admit: 2020-11-22 | Discharge: 2020-11-22 | Disposition: A | Discharge status: Home / Self Care | Payer: Medicare (Managed Care) | Attending: Emergency Medicine | Admitting: Emergency Medicine

## 2020-11-22 ENCOUNTER — Other Ambulatory Visit: Payer: Self-pay

## 2020-11-22 DIAGNOSIS — M25512 Pain in left shoulder: Secondary | ICD-10-CM | POA: Diagnosis present

## 2020-11-22 DIAGNOSIS — Z87891 Personal history of nicotine dependence: Secondary | ICD-10-CM | POA: Diagnosis not present

## 2020-11-22 MED ORDER — LIDOCAINE 5 % EX PTCH: 1.0000 | MEDICATED_PATCH | CUTANEOUS | 0 refills | Status: DC

## 2020-11-22 MED ORDER — PREDNISONE 50 MG PO TABS: 50.0000 mg | ORAL_TABLET | Freq: Every day | ORAL | 0 refills | Status: AC

## 2020-11-22 MED ORDER — OXYCODONE-ACETAMINOPHEN 5-325 MG PO TABS: 1.0000 | ORAL_TABLET | Freq: Three times a day (TID) | ORAL | 0 refills | Status: AC | PRN

## 2020-11-22 NOTE — ED Provider Notes (Signed)
[facility] DEPT Provider Note   CSN: [id number] Arrival date & time: 11/22/20  0516     History Chief Complaint  Patient presents with   Shoulder Pain    Amanda Davenport is a 40 y.o. female with past medical history significant for chronic pain, prediabetes, hidradenitis who presents for evaluation of left shoulder pain.  Has been present for weeks.  States was seen by PCP for this problem previously.  This has been recurrent.  Feels like her shoulder is "frozen."  Had surgery previously on the shoulder.  Seen by PT for this early summer for similar.  Also seen by orthopedics 1 week ago for knee pain however did not mention her chronic shoulder pain.  No radicular symptoms.  No headache, neck pain, paresthesias, weakness.  No overlying redness, swelling or warmth.  Unsure of injury however did state pain got worse when she got up from a friend's couch and leaned on her left shoulder.  She has no chest pain, shortness of breath, abdominal pain.  Rates pain a 7/10. Described pain as aching. Denies additional aggravating or alleviating factors.  Seen by PCP previously, told she likely needs MRI to r/o torn tendon or ligament per patient.  History obtained from patient and past medical records.  No interpreter used.  HPI     Past Medical History:  Diagnosis Date   Allergy    Anemia    receives transfusions periodically   Anxiety    Arrhythmia    Chronic headache    Depression    Dysrhythmia    Gout 12/2018   Knee pain    Nearsightedness    wears glasses   Neuropathy    Obesity    Pneumonia    Pre-diabetes    Recurrent boils    WPW (Wolff-Parkinson-White syndrome)     Patient Active Problem List   Diagnosis Date Noted   Generalized abdominal pain 03/15/2019   Neuropathy 12/27/2018   Ovarian cyst 04/29/2018   Facial cellulitis    Otitis externa    Dental abscess 11/27/2016   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   Absolute anemia  05/21/2015   Neck strain 12/11/2014   Hydradenitis 12/11/2014   Dental caries 12/11/2014   Constipation 12/03/2014   Neck pain 12/03/2014   Myalgia and myositis 11/27/2014   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   Morbid obesity ([place]) 11/27/2014   Hematochezia 12/16/2012   Wolff-Parkinson-White (WPW) syndrome 03/01/2012   Iron deficiency anemia 02/16/2012   Tachycardia 01/28/2012   Hidradenitis suppurativa 08/18/2011   AXILLARY ABSCESS 12/30/2006   ANKLE PAIN 10/11/2006    Past Surgical History:  Procedure Laterality Date   ADJACENT TISSUE TRANSFER/TISSUE REARRANGEMENT Left 07/28/2019   Procedure: ADJACENT TISSUE TRANSFER TO LEFT AXILLA GREATER THAN 100 CM SQUARED;  Surgeon: Irene Limbo, MD;  Location: [facility];  Service: Plastics;  Laterality: Left;   HYDRADENITIS EXCISION Left 07/28/2019   Procedure: EXCISION LEFT HIDRADENITIS AXILLA;  Surgeon: Clovis Riley, MD;  Location: [facility];  Service: General;  Laterality: Left;   TONSILLECTOMY       OB History     Gravida  0   Para      Term      Preterm      AB      Living         SAB      IAB      Ectopic      Multiple      Live Births  Family History  Problem Relation Age of Onset   Breast cancer Mother    Pulmonary embolism Mother        died of PE   Colon cancer Mother    Irritable bowel syndrome Mother    Cancer Mother    Hypertension Father    Diabetes Paternal Grandmother    Heart disease Neg Hx    Stroke Neg Hx     Social History   Tobacco Use   Smoking status: Former    Years: 0.50    Types: Cigarettes, Cigars    Quit date: 04/30/2019    Years since quitting: 1.5   Smokeless tobacco: Never   Tobacco comments:    smokes black and milds - last use early-mid August  Vaping Use   Vaping Use: Never used  Substance Use Topics   Alcohol use: No   Drug use: Yes    Types: Marijuana    Comment: 2+ times per month    Home Medications Prior to Admission medications    Medication Sig Start Date End Date Taking? Authorizing Provider  colchicine 0.6 MG tablet TAKE 1 TABLET (0.6 MG TOTAL) BY MOUTH EVERY 8 (EIGHT) HOURS AS NEEDED (FOR GOUTY ATTACKS). 11/20/19   Gildardo Pounds, NP  febuxostat (ULORIC) 40 MG tablet TAKE 2 TABLETS(80 MG) BY MOUTH DAILY 05/16/20   Vevelyn Francois, NP  gabapentin (NEURONTIN) 600 MG tablet Take 1 tablet (600 mg total) by mouth 3 (three) times daily. 12/28/19 12/27/20  Vevelyn Francois, NP  ibuprofen (ADVIL) 800 MG tablet TAKE 1 TABLET BY MOUTH EVERY 8 HOURS AS NEEDED FOR MODERATE PAIN. EAT BEFORE TAKING MEDICATION 03/14/20   Charlott Rakes, MD  lidocaine (LIDODERM) 5 % Place 1 patch onto the skin daily. Remove & Discard patch within 12 hours or as directed by MD 11/22/20  Yes Geremiah Fussell A, PA-C  meloxicam (MOBIC) 15 MG tablet Take 1 tablet (15 mg total) by mouth daily. 02/07/20   Vevelyn Francois, NP  metoprolol succinate (TOPROL XL) 25 MG 24 hr tablet Take 1 tablet (25 mg total) by mouth daily. 06/17/20   Evans Lance, MD  mupirocin ointment (BACTROBAN) 2 % Apply to affected skin areas twice daily x5 days then as needed 06/28/19   Fulp, Cammie, MD  ondansetron (ZOFRAN) 4 MG tablet TAKE 1 TABLET(4 MG) BY MOUTH EVERY 8 HOURS AS NEEDED FOR NAUSEA OR VOMITING 04/02/20   Azzie Glatter, FNP  oxyCODONE-acetaminophen (PERCOCET/ROXICET) 5-325 MG tablet Take 1 tablet by mouth every 8 (eight) hours as needed for up to 3 days for severe pain. 11/22/20 11/25/20 Yes Roselyn Doby A, PA-C  predniSONE (DELTASONE) 50 MG tablet Take 1 tablet (50 mg total) by mouth daily for 5 days. 11/22/20 11/27/20 Yes Annette Liotta A, PA-C  triamcinolone cream (KENALOG) 0.1 % Apply 1 application topically 2 (two) times daily. 12/06/19   Vevelyn Francois, NP    Allergies    Other, Penicillins, Shellfish-derived products, Shrimp [shellfish allergy], and Sulfa antibiotics  Review of Systems   Review of Systems  Constitutional: Negative.   HENT: Negative.     Respiratory: Negative.    Cardiovascular: Negative.   Gastrointestinal: Negative.   Genitourinary: Negative.   Musculoskeletal:  Negative for neck pain and neck stiffness.       Left shoulder pain  Skin: Negative.   Neurological: Negative.   All other systems reviewed and are negative.  Physical Exam Updated Vital Signs BP (!) 141/91 (BP Location: Left Arm)  Pulse 96   Temp 98.5 F (36.9 C) (Oral)   Resp 18   LMP 11/20/2020   SpO2 100%   Physical Exam Vitals and nursing note reviewed.  Constitutional:      General: She is not in acute distress.    Appearance: She is well-developed. She is not ill-appearing, toxic-appearing or diaphoretic.  HENT:     Head: Normocephalic and atraumatic.     Nose: Nose normal.     Mouth/Throat:     Mouth: Mucous membranes are moist.  Eyes:     Pupils: Pupils are equal, round, and reactive to light.  Cardiovascular:     Rate and Rhythm: Normal rate.     Pulses: Normal pulses.          Radial pulses are 2+ on the right side and 2+ on the left side.     Heart sounds: Normal heart sounds.  Pulmonary:     Effort: Pulmonary effort is normal. No respiratory distress.     Breath sounds: Normal breath sounds and air entry.  Abdominal:     General: Bowel sounds are normal. There is no distension.     Palpations: Abdomen is soft.  Musculoskeletal:        General: Normal range of motion.     Cervical back: Normal range of motion.     Comments: Pain with range of motion to left shoulder.  Unable to perform overhead range of motion secondary to pain.  Nontender clavicle, scapula.  No bony tenderness to humerus, forearm.  Nontender midline cervical region.  Diffuse tenderness to trapezius.  Full range of motion right shoulder without difficulty  Skin:    General: Skin is warm and dry.     Capillary Refill: Capillary refill takes less than 2 seconds.     Comments: No edema, erythema or warmth  Neurological:     General: No focal deficit present.      Mental Status: She is alert and oriented to person, place, and time.     Sensory: Sensation is intact.     Motor: Motor function is intact.     Comments: Intact sensation Equal strength  Psychiatric:        Mood and Affect: Mood normal.    ED Results / Procedures / Treatments   Labs (all labs ordered are listed, but only abnormal results are displayed) Labs Reviewed - No data to display  EKG None  Radiology DG Shoulder Left  Result Date: 11/22/2020 CLINICAL DATA:  Arm pain due to injury EXAM: LEFT SHOULDER - 2+ VIEW COMPARISON:  None. FINDINGS: There is no evidence of fracture or dislocation. There is no evidence of arthropathy or other focal bone abnormality. Soft tissues are unremarkable. IMPRESSION: Negative. Electronically Signed   By: Monte Fantasia M.D.   On: 11/22/2020 06:20    Procedures Procedures   Medications Ordered in ED Medications - No data to display  ED Course  I have reviewed the triage vital signs and the nursing notes.  Pertinent labs & imaging results that were available during my care of the patient were reviewed by me and considered in my medical decision making (see chart for details).  Here for evaluation of left shoulder pain which appears to be an ongoing issue.  Likely acute on chronic.  She is afebrile, nonseptic, not ill-appearing.  No radicular symptoms.  Has been in PT for similar complaints, most recently at early summer.  She has no overlying skin changes.  Low suspicion for  septic joint, gout, hemarthrosis, occult fracture, dislocation, rhabdomyolysis, cervical radicular symptoms, VTE.  Does have some difficulty with range of motion, has history of prediabetes.  Has also had this in other joints, followed by rheumatology as well as orthopedics.  Question frozen shoulder, impingement.[place] here does not show any evidence of fracture, dislocation or effusion.  No chest, shortness of breath to suggest atypical intrathoracic etiology such as  ACS, PE or dissection.  Discussed short course of steroids, pain management, lidocaine patches.  Encouraged follow-up with PCP whom she is seen for this issue previously.  The patient has been appropriately medically screened and/or stabilized in the ED. I have low suspicion for any other emergent medical condition which would require further screening, evaluation or treatment in the ED or require inpatient management.  Patient is hemodynamically stable and in no acute distress.  Patient able to ambulate in department prior to ED.  Evaluation does not show acute pathology that would require ongoing or additional emergent interventions while in the emergency department or further inpatient treatment.  I have discussed the diagnosis with the patient and answered all questions.  Pain is been managed while in the emergency department and patient has no further complaints prior to discharge.  Patient is comfortable with plan discussed in room and is stable for discharge at this time.  I have discussed strict return precautions for returning to the emergency department.  Patient was encouraged to follow-up with PCP/specialist refer to at discharge.     MDM Rules/Calculators/A&P                            Final Clinical Impression(s) / ED Diagnoses Final diagnoses:  Left shoulder pain, unspecified chronicity    Rx / DC Orders ED Discharge Orders          Ordered    oxyCODONE-acetaminophen (PERCOCET/ROXICET) 5-325 MG tablet  Every 8 hours PRN        11/22/20 0735    lidocaine (LIDODERM) 5 %  Every 24 hours        11/22/20 0735    predniSONE (DELTASONE) 50 MG tablet  Daily        11/22/20 0735             Mertha Clyatt A, PA-C 11/22/20 0736    Truddie Hidden, MD 11/22/20 [phone]

## 2020-11-22 NOTE — ED Triage Notes (Signed)
Patient presents with left shoulder pain that began a few days ago after getting off of someone's couch. Patient denies any injury that she is aware of. Patient states she has had surgery on that shoulder.

## 2020-12-10 ENCOUNTER — Encounter: Payer: Self-pay | Admitting: Hematology

## 2020-12-15 ENCOUNTER — Telehealth: Payer: Self-pay

## 2020-12-15 NOTE — Telephone Encounter (Signed)
12/15/2020 Name: Amanda Davenport MRN: [id number] DOB: 21-Apr-1980  Unsuccessful outbound call made today to assist with:   scheduling medicare annual wellness visit  Outreach Attempt:  1st Attempt  A HIPAA compliant voice message was left requesting a return call.    Eugenia Mcalpine

## 2020-12-20 ENCOUNTER — Other Ambulatory Visit: Payer: Self-pay

## 2020-12-20 ENCOUNTER — Ambulatory Visit (INDEPENDENT_AMBULATORY_CARE_PROVIDER_SITE_OTHER): Payer: Medicare (Managed Care) | Admitting: Internal Medicine

## 2020-12-20 VITALS — BP 110/72 | HR 121 | Ht 70.0 in | Wt 287.0 lb

## 2020-12-20 DIAGNOSIS — I10 Essential (primary) hypertension: Secondary | ICD-10-CM

## 2020-12-20 DIAGNOSIS — E669 Obesity, unspecified: Secondary | ICD-10-CM | POA: Diagnosis not present

## 2020-12-20 DIAGNOSIS — I456 Pre-excitation syndrome: Secondary | ICD-10-CM | POA: Diagnosis not present

## 2020-12-20 NOTE — Patient Instructions (Signed)
Medication Instructions:  Your physician recommends that you continue on your current medications as directed. Please refer to the Current Medication list given to you today.  *If you need a refill on your cardiac medications before your next appointment, please call your pharmacy*   Lab Work: None ordered   Testing/Procedures: None ordered   Follow-Up: At [facility], you and your health needs are our priority.  As part of our continuing mission to provide you with exceptional heart care, we have created designated Provider Care Teams.  These Care Teams include your primary Cardiologist (physician) and Advanced Practice Providers (APPs -  Physician Assistants and Nurse Practitioners) who all work together to provide you with the care you need, when you need it.  Your next appointment:   1 year(s)  The format for your next appointment:   In Person  Provider:   Cristopher Peru, MD    Thank you for choosing [facility]!!

## 2020-12-20 NOTE — Progress Notes (Signed)
HPI Amanda Davenport returns today after a 7 year absence from our EP clinic. She is a pleasant morbidly obese woman with WPW syndrome, who has been controlled with beta blocker therapy. She has been asymptomatic with no episodes of SVT since her last visit. She has been bothered by should pain and a frozen shoulder and is on steroids. She did not take her toprol this morning.  Allergies  Allergen Reactions   Other Other (See Comments)    All Antibiotics cause severe vaginal yeast infections   Penicillins Hives, Itching, Swelling and Rash    Has patient had a PCN reaction causing immediate rash, facial/tongue/throat swelling, SOB or lightheadedness with hypotension: Yes Has patient had a PCN reaction causing severe rash involving mucus membranes or skin necrosis: Yes Has patient had a PCN reaction that required hospitalization Yes Has patient had a PCN reaction occurring within the last 10 years: Yes If all of the above answers are "NO", then may proceed with Cephalosporin use.  Has patient had a PCN reaction causing immediate rash, facial/tongue/throat swelling, SOB or lightheadedness with hypotension: Yes Has patient had a PCN reaction causing severe rash involving mucus membranes or skin necrosis: Yes Has patient had a PCN reaction that required hospitalization Yes Has patient had a PCN reaction occurring within the last 10 years: Yes If all of the above answers are "NO", then may proceed with Cephalosporin use. Has patient had a PCN reaction causing immediate rash, facial/tongue/throat swelling, SOB or lightheadedness with hypotension: Yes Has patient had a PCN reaction causing severe rash involving mucus membranes or skin necrosis: Yes Has patient had a PCN reaction that required hospitalization Yes Has patient had a PCN reaction occurring within the last 10 years: Yes If all of the above answers are "NO", then may proceed with Cephalosporin use.   Shellfish-Derived Products Hives    Shrimp [Shellfish Allergy] Hives   Sulfa Antibiotics Rash     Current Outpatient Medications  Medication Sig Dispense Refill   colchicine 0.6 MG tablet TAKE 1 TABLET (0.6 MG TOTAL) BY MOUTH EVERY 8 (EIGHT) HOURS AS NEEDED (FOR GOUTY ATTACKS). 30 tablet 3   febuxostat (ULORIC) 40 MG tablet TAKE 2 TABLETS(80 MG) BY MOUTH DAILY 60 tablet 1   gabapentin (NEURONTIN) 600 MG tablet Take 1 tablet (600 mg total) by mouth 3 (three) times daily. 90 tablet 11   ibuprofen (ADVIL) 800 MG tablet TAKE 1 TABLET BY MOUTH EVERY 8 HOURS AS NEEDED FOR MODERATE PAIN. EAT BEFORE TAKING MEDICATION 60 tablet 0   lidocaine (LIDODERM) 5 % Place 1 patch onto the skin daily. Remove & Discard patch within 12 hours or as directed by MD 30 patch 0   metoprolol succinate (TOPROL XL) 25 MG 24 hr tablet Take 1 tablet (25 mg total) by mouth daily. 90 tablet 3   ondansetron (ZOFRAN) 4 MG tablet TAKE 1 TABLET(4 MG) BY MOUTH EVERY 8 HOURS AS NEEDED FOR NAUSEA OR VOMITING 20 tablet 0   triamcinolone cream (KENALOG) 0.1 % Apply 1 application topically 2 (two) times daily. 456.6 g 0   meloxicam (MOBIC) 15 MG tablet Take 1 tablet (15 mg total) by mouth daily. 30 tablet 0   mupirocin ointment (BACTROBAN) 2 % Apply to affected skin areas twice daily x5 days then as needed 22 g 3   No current facility-administered medications for this visit.     Past Medical History:  Diagnosis Date   Allergy    Anemia  receives transfusions periodically   Anxiety    Arrhythmia    Chronic headache    Depression    Dysrhythmia    Gout 12/2018   Knee pain    Nearsightedness    wears glasses   Neuropathy    Obesity    Pneumonia    Pre-diabetes    Recurrent boils    WPW (Wolff-Parkinson-White syndrome)     ROS:   All systems reviewed and negative except as noted in the HPI.   Past Surgical History:  Procedure Laterality Date   ADJACENT TISSUE TRANSFER/TISSUE REARRANGEMENT Left 07/28/2019   Procedure: ADJACENT TISSUE TRANSFER TO  LEFT AXILLA GREATER THAN 100 CM SQUARED;  Surgeon: Irene Limbo, MD;  Location: [facility];  Service: Plastics;  Laterality: Left;   HYDRADENITIS EXCISION Left 07/28/2019   Procedure: EXCISION LEFT HIDRADENITIS AXILLA;  Surgeon: Clovis Riley, MD;  Location: [facility];  Service: General;  Laterality: Left;   TONSILLECTOMY       Family History  Problem Relation Age of Onset   Breast cancer Mother    Pulmonary embolism Mother        died of PE   Colon cancer Mother    Irritable bowel syndrome Mother    Cancer Mother    Hypertension Father    Diabetes Paternal Grandmother    Heart disease Neg Hx    Stroke Neg Hx      Social History   Socioeconomic History   Marital status: Single    Spouse name: Not on file   Number of children: Not on file   Years of education: Not on file   Highest education level: Not on file  Occupational History   Not on file  Tobacco Use   Smoking status: Former    Years: 0.50    Types: Cigarettes, Cigars    Quit date: 04/30/2019    Years since quitting: 1.6   Smokeless tobacco: Never   Tobacco comments:    smokes black and milds - last use early-mid August  Vaping Use   Vaping Use: Never used  Substance and Sexual Activity   Alcohol use: No   Drug use: Yes    Types: Marijuana    Comment: 2+ times per month   Sexual activity: Yes    Birth control/protection: None  Other Topics Concern   Not on file  Social History Narrative   Not on file   Social Determinants of Health   Financial Resource Strain: Not on file  Food Insecurity: Not on file  Transportation Needs: Not on file  Physical Activity: Not on file  Stress: Not on file  Social Connections: Not on file  Intimate Partner Violence: Not on file     BP 110/72   Pulse (!) 121   Ht '5\' 10"'$  (1.778 m)   Wt 287 lb (130.2 kg)   LMP 11/20/2020   SpO2 (!) 80% Comment: nails  BMI 41.18 kg/m   Physical Exam:  Well appearing NAD HEENT: Unremarkable Neck:  No JVD, no  thyromegally Lymphatics:  No adenopathy Back:  No CVA tenderness Lungs:  Clear with no wheezes HEART:  Regular rate rhythm, no murmurs, no rubs, no clicks Abd:  soft, positive bowel sounds, no organomegally, no rebound, no guarding Ext:  2 plus pulses, no edema, no cyanosis, no clubbing Skin:  No rashes no nodules Neuro:  CN II through XII intact, motor grossly intact  EKG - Sinus tachycardia with a posteroseptal AP  Assess/Plan:  WPW -  she is asymptomatic. She will continue her toprol. I encouraged her to avoid caffeine and ETOH. Obesity - she is encouraged to lose weight. HTN - her bp is controlled.   Carleene Overlie Hershel Corkery,MD

## 2020-12-25 ENCOUNTER — Telehealth: Payer: Self-pay

## 2020-12-25 NOTE — Telephone Encounter (Signed)
Unsuccessful outbound call to patient. Medicare Wellness Exam. I left a message to call back. 2nd attempt

## 2020-12-27 ENCOUNTER — Telehealth (INDEPENDENT_AMBULATORY_CARE_PROVIDER_SITE_OTHER): Payer: Self-pay | Admitting: Student

## 2020-12-27 NOTE — Telephone Encounter (Signed)
LM with patient's dad Mr. Steichen to have pt rtn my call to [phone] to schedule AWV. Please schedule AWV-I if pt calls the office.

## 2020-12-28 ENCOUNTER — Ambulatory Visit (INDEPENDENT_AMBULATORY_CARE_PROVIDER_SITE_OTHER): Payer: Medicare (Managed Care) | Admitting: Nurse Practitioner

## 2020-12-28 DIAGNOSIS — Z0000 Encounter for general adult medical examination without abnormal findings: Secondary | ICD-10-CM

## 2020-12-28 DIAGNOSIS — L732 Hidradenitis suppurativa: Secondary | ICD-10-CM | POA: Diagnosis not present

## 2020-12-28 DIAGNOSIS — I456 Pre-excitation syndrome: Secondary | ICD-10-CM

## 2020-12-28 DIAGNOSIS — N920 Excessive and frequent menstruation with regular cycle: Secondary | ICD-10-CM | POA: Insufficient documentation

## 2020-12-28 NOTE — Progress Notes (Signed)
Subjective:   Amanda Davenport is a 40 y.o. female who presents for Medicare Annual (Subsequent) preventive examination.  I connected with  [name] on 12/28/20 by an audio only telemedicine application and verified that I am speaking with the correct person using two identifiers.   I discussed the limitations, risks, security and privacy concerns of performing an evaluation and management service by telephone and the availability of in person appointments. I also discussed with the patient that there may be a patient responsible charge related to this service. The patient expressed understanding and verbally consented to this telephonic visit.  Location of Patient: Home Location of Provider: Office  List any persons and their role that are participating in the visit with the patient.    Review of Systems    Refer to PCP       Objective:    There were no vitals filed for this visit. There is no height or weight on file to calculate BMI.  Advanced Directives 11/22/2020 08/29/2020 02/16/2020 02/09/2020 01/31/2020 07/19/2019 06/01/2019  Does Patient Have a Medical Advance Directive? No No No No No No No  Would patient like information on creating a medical advance directive? - Yes (MAU/Ambulatory/Procedural Areas - Information given) No - Patient declined No - Patient declined No - Patient declined - No - Patient declined  Pre-existing out of facility DNR order (yellow form or pink MOST form) - - - - - - -    Current Medications (verified) Outpatient Encounter Medications as of 12/28/2020  Medication Sig   colchicine 0.6 MG tablet TAKE 1 TABLET (0.6 MG TOTAL) BY MOUTH EVERY 8 (EIGHT) HOURS AS NEEDED (FOR GOUTY ATTACKS).   febuxostat (ULORIC) 40 MG tablet TAKE 2 TABLETS(80 MG) BY MOUTH DAILY   ibuprofen (ADVIL) 800 MG tablet TAKE 1 TABLET BY MOUTH EVERY 8 HOURS AS NEEDED FOR MODERATE PAIN. EAT BEFORE TAKING MEDICATION   lidocaine (LIDODERM) 5 % Place 1 patch onto the skin daily.  Remove & Discard patch within 12 hours or as directed by MD   metoprolol succinate (TOPROL XL) 25 MG 24 hr tablet Take 1 tablet (25 mg total) by mouth daily.   ondansetron (ZOFRAN) 4 MG tablet TAKE 1 TABLET(4 MG) BY MOUTH EVERY 8 HOURS AS NEEDED FOR NAUSEA OR VOMITING   triamcinolone cream (KENALOG) 0.1 % Apply 1 application topically 2 (two) times daily.   gabapentin (NEURONTIN) 600 MG tablet Take 1 tablet (600 mg total) by mouth 3 (three) times daily.   [DISCONTINUED] meloxicam (MOBIC) 15 MG tablet Take 1 tablet (15 mg total) by mouth daily.   [DISCONTINUED] mupirocin ointment (BACTROBAN) 2 % Apply to affected skin areas twice daily x5 days then as needed   No facility-administered encounter medications on file as of 12/28/2020.    Allergies (verified) Other, Penicillins, Shellfish-derived products, Shrimp extract allergy skin test, Shrimp [shellfish allergy], and Sulfa antibiotics   History: Past Medical History:  Diagnosis Date   Allergy    Anemia    receives transfusions periodically   Anxiety    Arrhythmia    Chronic headache    Depression    Dysrhythmia    Gout 12/2018   Knee pain    Nearsightedness    wears glasses   Neuropathy    Obesity    Pneumonia    Pre-diabetes    Recurrent boils    WPW (Wolff-Parkinson-White syndrome)    Past Surgical History:  Procedure Laterality Date   ADJACENT TISSUE TRANSFER/TISSUE REARRANGEMENT Left  07/28/2019   Procedure: ADJACENT TISSUE TRANSFER TO LEFT AXILLA GREATER THAN 100 CM SQUARED;  Surgeon: Irene Limbo, MD;  Location: [facility];  Service: Plastics;  Laterality: Left;   HYDRADENITIS EXCISION Left 07/28/2019   Procedure: EXCISION LEFT HIDRADENITIS AXILLA;  Surgeon: Clovis Riley, MD;  Location: [facility];  Service: General;  Laterality: Left;   TONSILLECTOMY     Family History  Problem Relation Age of Onset   Breast cancer Mother    Pulmonary embolism Mother        died of PE   Colon cancer Mother    Irritable bowel  syndrome Mother    Cancer Mother    Hypertension Father    Diabetes Paternal Grandmother    Heart disease Neg Hx    Stroke Neg Hx    Social History   Socioeconomic History   Marital status: Not on file    Spouse name: Not on file   Number of children: 0   Years of education: Not on file   Highest education level: Not on file  Occupational History   Occupation: disabled  Tobacco Use   Smoking status: Former    Years: 0.50    Types: Cigarettes, Cigars    Quit date: 04/30/2019    Years since quitting: 1.6   Smokeless tobacco: Never   Tobacco comments:    smokes black and milds - last use early-mid August  Vaping Use   Vaping Use: Never used  Substance and Sexual Activity   Alcohol use: No   Drug use: Yes    Types: Marijuana    Comment: 2+ times per month   Sexual activity: Yes    Partners: Male    Birth control/protection: Condom  Other Topics Concern   Not on file  Social History Narrative   Not on file   Social Determinants of Health   Financial Resource Strain: Low Risk    Difficulty of Paying Living Expenses: Not hard at all  Food Insecurity: No Food Insecurity   Worried About Charity fundraiser in the Last Year: Never true   [place] in the Last Year: Never true  Transportation Needs: No Transportation Needs   Lack of Transportation (Medical): No   Lack of Transportation (Non-Medical): No  Physical Activity: Inactive   Days of Exercise per Week: 0 days   Minutes of Exercise per Session: 0 min  Stress: Stress Concern Present   Feeling of Stress : To some extent  Social Connections: Socially Isolated   Frequency of Communication with Friends and Family: More than three times a week   Frequency of Social Gatherings with Friends and Family: Never   Attends Religious Services: Never   Marine scientist or Organizations: No   Attends Archivist Meetings: Never   Marital Status: Never married    Tobacco Counseling Counseling given:  Not Answered Tobacco comments: smokes black and milds - last use early-mid August   Clinical Intake:                 Diabetic?No         Activities of Daily Living In your present state of health, do you have any difficulty performing the following activities: 12/28/2020  Hearing? N  Vision? N  Difficulty concentrating or making decisions? Y  Walking or climbing stairs? Y  Dressing or bathing? Y  Doing errands, shopping? Y  Some recent data might be hidden    Patient Care Team: [place],  Rachel Moulds, NP as PCP - General  Indicate any recent Medical Services you may have received from other than [facility] providers in the past year (date may be approximate).     Assessment:   This is a routine wellness examination for [facility].  Hearing/Vision screen No results found.  Dietary issues and exercise activities discussed:     Goals Addressed   None   Depression Screen PHQ 2/9 Scores 12/28/2020 11/20/2019 06/28/2019 04/27/2019 04/13/2019 02/14/2019 01/06/2019  PHQ - 2 Score 2 0 6 6 6 1 6   PHQ- 9 Score 10 0 19 17 21  - 15    Fall Risk Fall Risk  12/28/2020 03/14/2019 02/14/2019 01/06/2019 12/27/2018  Falls in the past year? 0 0 0 0 0  Number falls in past yr: 0 0 - 0 0  Injury with Fall? 0 0 - 0 0  Risk for fall due to : No Fall Risks - - - -  Follow up Falls evaluation completed - - - -    FALL RISK PREVENTION PERTAINING TO THE HOME:  Any stairs in or around the home? Yes  If so, are there any without handrails? Yes  Home free of loose throw rugs in walkways, pet beds, electrical cords, etc? Yes  Adequate lighting in your home to reduce risk of falls? Yes   ASSISTIVE DEVICES UTILIZED TO PREVENT FALLS:  Life alert? No  Use of a cane, walker or w/c? Yes  Grab bars in the bathroom? Yes  Shower chair or bench in shower? No  Elevated toilet seat or a handicapped toilet? No   TIMED UP AND GO:  Was the test performed? No .  Length of time to ambulate 10 feet:  sec.    Telehealth  Cognitive Function:     6CIT Screen 12/28/2020  What Year? 0 points  What month? 0 points  What time? 0 points  Count back from 20 0 points  Months in reverse 0 points  Repeat phrase 0 points  Total Score 0    Immunizations Immunization History  Administered Date(s) Administered   Tdap 05/20/2015    TDAP status: Up to date  Flu Vaccine status: Due, Education has been provided regarding the importance of this vaccine. Advised may receive this vaccine at local pharmacy or Health Dept. Aware to provide a copy of the vaccination record if obtained from local pharmacy or Health Dept. Verbalized acceptance and understanding.  Pneumococcal vaccine status: Declined,  Education has been provided regarding the importance of this vaccine but patient still declined. Advised may receive this vaccine at local pharmacy or Health Dept. Aware to provide a copy of the vaccination record if obtained from local pharmacy or Health Dept. Verbalized acceptance and understanding.   Covid-19 vaccine status: Declined, Education has been provided regarding the importance of this vaccine but patient still declined. Advised may receive this vaccine at local pharmacy or Health Dept.or vaccine clinic. Aware to provide a copy of the vaccination record if obtained from local pharmacy or Health Dept. Verbalized acceptance and understanding.  Qualifies for Shingles Vaccine? No   Zostavax completed No   Shingrix Completed?: No.    Education has been provided regarding the importance of this vaccine. Patient has been advised to call insurance company to determine out of pocket expense if they have not yet received this vaccine. Advised may also receive vaccine at local pharmacy or Health Dept. Verbalized acceptance and understanding.  Screening Tests Health Maintenance  Topic Date Due   COVID-19 Vaccine (1)  Never done   PAP SMEAR-Modifier  10/03/2022   TETANUS/TDAP  05/19/2025   Hepatitis C  Screening  Completed   HIV Screening  Completed   HPV VACCINES  Aged Out    Health Maintenance  Health Maintenance Due  Topic Date Due   COVID-19 Vaccine (1) Never done    Colorectal: Does not qualify due to age  30: Does not qualify due to age  Dexa: Does not qualify due to age  Lung Cancer Screening: (Low Dose CT Chest recommended if Age 90-80 years, 30 pack-year currently smoking OR have quit w/in 15years.) does not qualify.   Lung Cancer Screening Referral:   Additional Screening:  Hepatitis C Screening: does not qualify;   Vision Screening: Recommended annual ophthalmology exams for early detection of glaucoma and other disorders of the eye. Is the patient up to date with their annual eye exam?  Yes  Who is the provider or what is the name of the office in which the patient attends annual eye exams? [facility] If pt is not established with a provider, would they like to be referred to a provider to establish care? No .   Dental Screening: Recommended annual dental exams for proper oral hygiene  Community Resource Referral / Chronic Care Management: CRR required this visit?  No   CCM required this visit?  No      Plan:     I have personally reviewed and noted the following in the patient's chart:   Medical and social history Use of alcohol, tobacco or illicit drugs  Current medications and supplements including opioid prescriptions.  Functional ability and status Nutritional status Physical activity Advanced directives List of other physicians Hospitalizations, surgeries, and ER visits in previous 12 months Vitals Screenings to include cognitive, depression, and falls Referrals and appointments  In addition, I have reviewed and discussed with patient certain preventive protocols, quality metrics, and best practice recommendations. A written personalized care plan for preventive services as well as general preventive health recommendations were  provided to patient.     [place], [place]   12/28/2020   Nurse Notes: Non-face to face. 35 Minutes  Patient is requesting home health to come out to help with cleaning up in the house and washing clothes on the days that she has difficulty walking and physical therapy. Referral has been placed. Patient is aware to reach out to PCP office if she has not heard from [place] within 1 week.

## 2020-12-28 NOTE — Patient Instructions (Signed)
Preventive Care 21-39 Years Old, Female Preventive care refers to lifestyle choices and visits with your health care provider that can promote health and wellness. This includes: A yearly physical exam. This is also called an annual wellness visit. Regular dental and eye exams. Immunizations. Screening for certain conditions. Healthy lifestyle choices, such as: Eating a healthy diet. Getting regular exercise. Not using drugs or products that contain nicotine and tobacco. Limiting alcohol use. What can I expect for my preventive care visit? Physical exam Your health care provider may check your: Height and weight. These may be used to calculate your BMI (body mass index). BMI is a measurement that tells if you are at a healthy weight. Heart rate and blood pressure. Body temperature. Skin for abnormal spots. Counseling Your health care provider may ask you questions about your: Past medical problems. Family's medical history. Alcohol, tobacco, and drug use. Emotional well-being. Home life and relationship well-being. Sexual activity. Diet, exercise, and sleep habits. Work and work environment. Access to firearms. Method of birth control. Menstrual cycle. Pregnancy history. What immunizations do I need? Vaccines are usually given at various ages, according to a schedule. Your health care provider will recommend vaccines for you based on your age, medical history, and lifestyle or other factors, such as travel or where you work. What tests do I need? Blood tests Lipid and cholesterol levels. These may be checked every 5 years starting at age 20. Hepatitis C test. Hepatitis B test. Screening Diabetes screening. This is done by checking your blood sugar (glucose) after you have not eaten for a while (fasting). STD (sexually transmitted disease) testing, if you are at risk. BRCA-related cancer screening. This may be done if you have a family history of breast, ovarian, tubal, or  peritoneal cancers. Pelvic exam and Pap test. This may be done every 3 years starting at age 21. Starting at age 30, this may be done every 5 years if you have a Pap test in combination with an HPV test. Talk with your health care provider about your test results, treatment options, and if necessary, the need for more tests. Follow these instructions at home: Eating and drinking  Eat a healthy diet that includes fresh fruits and vegetables, whole grains, lean protein, and low-fat dairy products. Take vitamin and mineral supplements as recommended by your health care provider. Do not drink alcohol if: Your health care provider tells you not to drink. You are pregnant, may be pregnant, or are planning to become pregnant. If you drink alcohol: Limit how much you have to 0-1 drink a day. Be aware of how much alcohol is in your drink. In the [place] one drink equals one 12 oz bottle of beer (355 mL), one 5 oz glass of wine (148 mL), or one 1 oz glass of hard liquor (44 mL). Lifestyle Take daily care of your teeth and gums. Brush your teeth every morning and night with fluoride toothpaste. Floss one time each day. Stay active. Exercise for at least 30 minutes 5 or more days each week. Do not use any products that contain nicotine or tobacco, such as cigarettes, e-cigarettes, and chewing tobacco. If you need help quitting, ask your health care provider. Do not use drugs. If you are sexually active, practice safe sex. Use a condom or other form of protection to prevent STIs (sexually transmitted infections). If you do not wish to become pregnant, use a form of birth control. If you plan to become pregnant, see your health care provider   for a prepregnancy visit. Find healthy ways to cope with stress, such as: Meditation, yoga, or listening to music. Journaling. Talking to a trusted person. Spending time with friends and family. Safety Always wear your seat belt while driving or riding in a  vehicle. Do not drive: If you have been drinking alcohol. Do not ride with someone who has been drinking. When you are tired or distracted. While texting. Wear a helmet and other protective equipment during sports activities. If you have firearms in your house, make sure you follow all gun safety procedures. Seek help if you have been physically or sexually abused. What's next? Go to your health care provider once a year for an annual wellness visit. Ask your health care provider how often you should have your eyes and teeth checked. Stay up to date on all vaccines. This information is not intended to replace advice given to you by your health care provider. Make sure you discuss any questions you have with your health care provider. Document Revised: 05/31/2020 Document Reviewed: 12/02/2017 Elsevier Patient Education  2022 [employer]  

## 2021-01-28 ENCOUNTER — Encounter: Payer: Self-pay | Admitting: Hematology

## 2021-01-28 ENCOUNTER — Other Ambulatory Visit: Payer: Self-pay

## 2021-01-28 ENCOUNTER — Ambulatory Visit
Admission: EM | Admit: 2021-01-28 | Discharge: 2021-01-28 | Disposition: A | Discharge status: Home / Self Care | Payer: Medicare (Managed Care) | Attending: Physician Assistant | Admitting: Physician Assistant

## 2021-01-28 ENCOUNTER — Encounter: Payer: Self-pay | Admitting: *Deleted

## 2021-01-28 DIAGNOSIS — S0501XA Injury of conjunctiva and corneal abrasion without foreign body, right eye, initial encounter: Secondary | ICD-10-CM

## 2021-01-28 MED ORDER — BACITRACIN-POLYMYXIN B 500-10000 UNIT/GM OP OINT: 1.0000 "application " | TOPICAL_OINTMENT | Freq: Two times a day (BID) | OPHTHALMIC | 0 refills | Status: AC

## 2021-01-28 NOTE — ED Provider Notes (Signed)
[facility]    CSN: [id number] Arrival date & time: 01/28/21  1738      History   Chief Complaint Chief Complaint  Patient presents with   Eye Problem    HPI Amanda Davenport is a 40 y.o. female.   Patient here today for evaluation of right eye irritation that started this morning after she woke up and rubbed her eye.  She states she has felt like there is something in her eye since that time.  She does admit to wearing an eye mass to bed but does not recall any foreign body or know of any other injury.  She does not wear contacts.  She does not report fever or chills.  She has had some photosensitivity.  The history is provided by the patient.  Eye Problem Associated symptoms: discharge, photophobia and redness   Associated symptoms: no nausea and no vomiting    Past Medical History:  Diagnosis Date   Allergy    Anemia    receives transfusions periodically   Anxiety    Arrhythmia    Chronic headache    Depression    Dysrhythmia    Gout 12/2018   Knee pain    Nearsightedness    wears glasses   Neuropathy    Obesity    Pneumonia    Pre-diabetes    Recurrent boils    WPW (Wolff-Parkinson-White syndrome)     Patient Active Problem List   Diagnosis Date Noted   Menorrhagia 12/28/2020   COVID-19 virus infection 04/27/2020   Gout 04/27/2020   Generalized abdominal pain 03/15/2019   Neuropathy 12/27/2018   Ovarian cyst 04/29/2018   Facial cellulitis    Otitis externa    Dental abscess 11/27/2016   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   Absolute anemia 05/21/2015   Neck strain 12/11/2014   Hydradenitis 12/11/2014   Dental caries 12/11/2014   Constipation 12/03/2014   Neck pain 12/03/2014   Myalgia and myositis 11/27/2014   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   Morbid obesity ([place]) 11/27/2014   Hematochezia 12/16/2012   Wolff-Parkinson-White (WPW) syndrome 03/01/2012   Iron deficiency anemia 02/16/2012   Tachycardia 01/28/2012    Hidradenitis suppurativa 08/18/2011   AXILLARY ABSCESS 12/30/2006   ANKLE PAIN 10/11/2006    Past Surgical History:  Procedure Laterality Date   ADJACENT TISSUE TRANSFER/TISSUE REARRANGEMENT Left 07/28/2019   Procedure: ADJACENT TISSUE TRANSFER TO LEFT AXILLA GREATER THAN 100 CM SQUARED;  Surgeon: Irene Limbo, MD;  Location: [facility];  Service: Plastics;  Laterality: Left;   HYDRADENITIS EXCISION Left 07/28/2019   Procedure: EXCISION LEFT HIDRADENITIS AXILLA;  Surgeon: Clovis Riley, MD;  Location: [facility];  Service: General;  Laterality: Left;   TONSILLECTOMY      OB History     Gravida  0   Para      Term      Preterm      AB      Living         SAB      IAB      Ectopic      Multiple      Live Births               Home Medications    Prior to Admission medications   Medication Sig Start Date End Date Taking? Authorizing Provider  bacitracin-polymyxin b (POLYSPORIN) ophthalmic ointment Place 1 application into the right eye every 12 (twelve) hours for 7 days. apply to eye every 12  hours while awake 01/28/21 02/04/21 Yes Francene Finders, PA-C  bacitracin-polymyxin b (POLYSPORIN) ophthalmic ointment Place 1 application into the right eye every 12 (twelve) hours for 7 days. apply to eye every 12 hours while awake 01/28/21 02/04/21 Yes Francene Finders, PA-C  colchicine 0.6 MG tablet TAKE 1 TABLET (0.6 MG TOTAL) BY MOUTH EVERY 8 (EIGHT) HOURS AS NEEDED (FOR GOUTY ATTACKS). 11/20/19   Gildardo Pounds, NP  febuxostat (ULORIC) 40 MG tablet TAKE 2 TABLETS(80 MG) BY MOUTH DAILY 05/16/20   Vevelyn Francois, NP  gabapentin (NEURONTIN) 600 MG tablet Take 1 tablet (600 mg total) by mouth 3 (three) times daily. 12/28/19 12/27/20  Vevelyn Francois, NP  ibuprofen (ADVIL) 800 MG tablet TAKE 1 TABLET BY MOUTH EVERY 8 HOURS AS NEEDED FOR MODERATE PAIN. EAT BEFORE TAKING MEDICATION 03/14/20   Charlott Rakes, MD  lidocaine (LIDODERM) 5 % Place 1 patch onto the skin daily. Remove &  Discard patch within 12 hours or as directed by MD 11/22/20   Henderly, Britni A, PA-C  metoprolol succinate (TOPROL XL) 25 MG 24 hr tablet Take 1 tablet (25 mg total) by mouth daily. 06/17/20   Evans Lance, MD  ondansetron (ZOFRAN) 4 MG tablet TAKE 1 TABLET(4 MG) BY MOUTH EVERY 8 HOURS AS NEEDED FOR NAUSEA OR VOMITING 04/02/20   Azzie Glatter, FNP  triamcinolone cream (KENALOG) 0.1 % Apply 1 application topically 2 (two) times daily. 12/06/19   Vevelyn Francois, NP    Family History Family History  Problem Relation Age of Onset   Breast cancer Mother    Pulmonary embolism Mother        died of PE   Colon cancer Mother    Irritable bowel syndrome Mother    Cancer Mother    Hypertension Father    Diabetes Paternal Grandmother    Heart disease Neg Hx    Stroke Neg Hx     Social History Social History   Tobacco Use   Smoking status: Former    Years: 0.50    Types: Cigarettes, Cigars    Quit date: 04/30/2019    Years since quitting: 1.7   Smokeless tobacco: Never   Tobacco comments:    smokes black and milds - last use early-mid August  Vaping Use   Vaping Use: Never used  Substance Use Topics   Alcohol use: No   Drug use: Yes    Types: Marijuana    Comment: 2+ times per month     Allergies   Other, Penicillins, Shellfish-derived products, Shrimp extract allergy skin test, Shrimp [shellfish allergy], and Sulfa antibiotics   Review of Systems Review of Systems  Constitutional:  Negative for chills and fever.  Eyes:  Positive for photophobia, pain, discharge, redness and visual disturbance.  Respiratory:  Negative for shortness of breath.   Gastrointestinal:  Negative for abdominal pain, nausea and vomiting.  Genitourinary:  Positive for vaginal bleeding and vaginal discharge.    Physical Exam Triage Vital Signs ED Triage Vitals  Enc Vitals Group     BP 01/28/21 1922 103/75     Pulse Rate 01/28/21 1922 95     Resp 01/28/21 1922 18     Temp 01/28/21 1922 98.5  F (36.9 C)     Temp src --      SpO2 01/28/21 1922 97 %     Weight --      Height --      Head Circumference --  Peak Flow --      Pain Score 01/28/21 1920 4     Pain Loc --      Pain Edu? --      Excl. in [place]? --    No data found.  Updated Vital Signs BP 103/75   Pulse 95   Temp 98.5 F (36.9 C)   Resp 18   LMP 01/21/2021   SpO2 97%   Visual Acuity Right Eye Distance: 20/50 Left Eye Distance: 20/40 Bilateral Distance: 20/40      Physical Exam Vitals and nursing note reviewed.  Constitutional:      General: She is not in acute distress.    Appearance: Normal appearance. She is not ill-appearing.  HENT:     Head: Normocephalic and atraumatic.  Eyes:     General: Lids are normal.        Right eye: Discharge present. No foreign body.     Conjunctiva/sclera:     Right eye: Right conjunctiva is injected.     Comments: Increased fluorescein uptake to cornea at 12 oclock position  Cardiovascular:     Rate and Rhythm: Normal rate.  Pulmonary:     Effort: Pulmonary effort is normal.  Neurological:     Mental Status: She is alert.  Psychiatric:        Mood and Affect: Mood normal.        Behavior: Behavior normal.        Thought Content: Thought content normal.     UC Treatments / Results  Labs (all labs ordered are listed, but only abnormal results are displayed) Labs Reviewed - No data to display  EKG   Radiology No results found.  Procedures Procedures (including critical care time)  Medications Ordered in UC Medications - No data to display  Initial Impression / Assessment and Plan / UC Course  I have reviewed the triage vital signs and the nursing notes.  Pertinent labs & imaging results that were available during my care of the patient were reviewed by me and considered in my medical decision making (see chart for details).  Antibiotic ointment prescribed for corneal abrasion.  Recommended follow-up with her ophthalmologist in a week.   Encouraged sooner follow-up with any further concerns.  Final Clinical Impressions(s) / UC Diagnoses   Final diagnoses:  Abrasion of right cornea, initial encounter     Discharge Instructions      Follow up with ophthalmology in one week.      ED Prescriptions     Medication Sig Dispense Auth. Provider   bacitracin-polymyxin b (POLYSPORIN) ophthalmic ointment Place 1 application into the right eye every 12 (twelve) hours for 7 days. apply to eye every 12 hours while awake 3.5 g Francene Finders, PA-C   bacitracin-polymyxin b (POLYSPORIN) ophthalmic ointment Place 1 application into the right eye every 12 (twelve) hours for 7 days. apply to eye every 12 hours while awake 3.5 g Francene Finders, PA-C      PDMP not reviewed this encounter.   Francene Finders, PA-C 01/28/21 2011

## 2021-01-28 NOTE — Discharge Instructions (Signed)
Follow up with ophthalmology in one week.

## 2021-01-28 NOTE — ED Triage Notes (Signed)
Pt reports waking up this morning and rubbed her eye and has had eye pain since then. Pt also has blurred vision. Pt did call her primary eye DR .but the co pay on office visit was $60 and she can not afford that.

## 2021-02-05 ENCOUNTER — Encounter: Payer: Self-pay | Admitting: Hematology

## 2021-05-06 ENCOUNTER — Encounter: Payer: Self-pay | Admitting: Hematology

## 2021-05-06 ENCOUNTER — Other Ambulatory Visit: Payer: Self-pay

## 2021-05-06 ENCOUNTER — Emergency Department (HOSPITAL_BASED_OUTPATIENT_CLINIC_OR_DEPARTMENT_OTHER): Payer: Medicare (Managed Care)

## 2021-05-06 ENCOUNTER — Encounter (HOSPITAL_BASED_OUTPATIENT_CLINIC_OR_DEPARTMENT_OTHER): Payer: Self-pay | Admitting: Urology

## 2021-05-06 ENCOUNTER — Inpatient Hospital Stay (HOSPITAL_BASED_OUTPATIENT_CLINIC_OR_DEPARTMENT_OTHER)
Admission: EM | Admit: 2021-05-06 | Discharge: 2021-05-14 | DRG: 603 | Disposition: A | Discharge status: Home Health | Payer: Medicare (Managed Care) | Attending: Family Medicine | Admitting: Family Medicine

## 2021-05-06 DIAGNOSIS — L0291 Cutaneous abscess, unspecified: Secondary | ICD-10-CM | POA: Diagnosis not present

## 2021-05-06 DIAGNOSIS — Z880 Allergy status to penicillin: Secondary | ICD-10-CM

## 2021-05-06 DIAGNOSIS — I959 Hypotension, unspecified: Secondary | ICD-10-CM | POA: Diagnosis present

## 2021-05-06 DIAGNOSIS — R0682 Tachypnea, not elsewhere classified: Secondary | ICD-10-CM | POA: Diagnosis present

## 2021-05-06 DIAGNOSIS — Z20822 Contact with and (suspected) exposure to covid-19: Secondary | ICD-10-CM | POA: Diagnosis present

## 2021-05-06 DIAGNOSIS — L02411 Cutaneous abscess of right axilla: Secondary | ICD-10-CM | POA: Diagnosis present

## 2021-05-06 DIAGNOSIS — F419 Anxiety disorder, unspecified: Secondary | ICD-10-CM | POA: Diagnosis present

## 2021-05-06 DIAGNOSIS — L732 Hidradenitis suppurativa: Secondary | ICD-10-CM | POA: Diagnosis present

## 2021-05-06 DIAGNOSIS — A419 Sepsis, unspecified organism: Secondary | ICD-10-CM | POA: Diagnosis not present

## 2021-05-06 DIAGNOSIS — F32A Depression, unspecified: Secondary | ICD-10-CM | POA: Diagnosis present

## 2021-05-06 DIAGNOSIS — I456 Pre-excitation syndrome: Secondary | ICD-10-CM | POA: Diagnosis present

## 2021-05-06 DIAGNOSIS — Z91013 Allergy to seafood: Secondary | ICD-10-CM | POA: Diagnosis not present

## 2021-05-06 DIAGNOSIS — Z79899 Other long term (current) drug therapy: Secondary | ICD-10-CM | POA: Diagnosis not present

## 2021-05-06 DIAGNOSIS — L0231 Cutaneous abscess of buttock: Secondary | ICD-10-CM | POA: Diagnosis present

## 2021-05-06 DIAGNOSIS — Z803 Family history of malignant neoplasm of breast: Secondary | ICD-10-CM

## 2021-05-06 DIAGNOSIS — Z881 Allergy status to other antibiotic agents status: Secondary | ICD-10-CM | POA: Diagnosis not present

## 2021-05-06 DIAGNOSIS — G629 Polyneuropathy, unspecified: Secondary | ICD-10-CM | POA: Diagnosis present

## 2021-05-06 DIAGNOSIS — Z833 Family history of diabetes mellitus: Secondary | ICD-10-CM

## 2021-05-06 DIAGNOSIS — Z87891 Personal history of nicotine dependence: Secondary | ICD-10-CM | POA: Diagnosis not present

## 2021-05-06 DIAGNOSIS — D509 Iron deficiency anemia, unspecified: Secondary | ICD-10-CM | POA: Diagnosis present

## 2021-05-06 DIAGNOSIS — L02214 Cutaneous abscess of groin: Secondary | ICD-10-CM | POA: Diagnosis present

## 2021-05-06 DIAGNOSIS — Z6841 Body Mass Index (BMI) 40.0 and over, adult: Secondary | ICD-10-CM | POA: Diagnosis not present

## 2021-05-06 DIAGNOSIS — Z800 Family history of malignant neoplasm of digestive organs: Secondary | ICD-10-CM | POA: Diagnosis not present

## 2021-05-06 DIAGNOSIS — Z888 Allergy status to other drugs, medicaments and biological substances status: Secondary | ICD-10-CM | POA: Diagnosis not present

## 2021-05-06 DIAGNOSIS — R7303 Prediabetes: Secondary | ICD-10-CM | POA: Diagnosis present

## 2021-05-06 DIAGNOSIS — D508 Other iron deficiency anemias: Secondary | ICD-10-CM | POA: Diagnosis not present

## 2021-05-06 DIAGNOSIS — L0390 Cellulitis, unspecified: Secondary | ICD-10-CM | POA: Diagnosis present

## 2021-05-06 DIAGNOSIS — M109 Gout, unspecified: Secondary | ICD-10-CM | POA: Diagnosis present

## 2021-05-06 DIAGNOSIS — Z8249 Family history of ischemic heart disease and other diseases of the circulatory system: Secondary | ICD-10-CM

## 2021-05-06 LAB — COMPREHENSIVE METABOLIC PANEL
ALT: 5 U/L (ref 0–44)
AST: 15 U/L (ref 15–41)
Albumin: 3.2 g/dL — ABNORMAL LOW (ref 3.5–5.0)
Alkaline Phosphatase: 64 U/L (ref 38–126)
Anion gap: 12 (ref 5–15)
BUN: 7 mg/dL (ref 6–20)
CO2: 22 mmol/L (ref 22–32)
Calcium: 9 mg/dL (ref 8.9–10.3)
Chloride: 101 mmol/L (ref 98–111)
Creatinine, Ser: 0.74 mg/dL (ref 0.44–1.00)
GFR, Estimated: >60 mL/min (ref >60)
Glucose, Bld: 121 mg/dL — ABNORMAL HIGH (ref 70–99)
Potassium: 3.6 mmol/L (ref 3.5–5.1)
Sodium: 135 mmol/L (ref 135–145)
Total Bilirubin: 0.3 mg/dL (ref 0.3–1.2)
Total Protein: 9.2 g/dL — ABNORMAL HIGH (ref 6.5–8.1)

## 2021-05-06 LAB — LACTIC ACID, PLASMA
Lactic Acid, Venous: 1.7 mmol/L (ref 0.5–1.9)
Lactic Acid, Venous: 1.2 mmol/L (ref 0.5–1.9)

## 2021-05-06 LAB — CBC WITH DIFFERENTIAL/PLATELET
Abs Immature Granulocytes: 0.06 10*3/uL (ref 0.00–0.07)
Basophils Absolute: 0.1 10*3/uL (ref 0.0–0.1)
Basophils Relative: 0 %
Eosinophils Absolute: 0.2 10*3/uL (ref 0.0–0.5)
Eosinophils Relative: 1 %
HCT: 28 % — ABNORMAL LOW (ref 36.0–46.0)
Hemoglobin: 8.5 g/dL — ABNORMAL LOW (ref 12.0–15.0)
Immature Granulocytes: 0 %
Lymphocytes Relative: 22 %
Lymphs Abs: 3.1 10*3/uL (ref 0.7–4.0)
MCH: 23.6 pg — ABNORMAL LOW (ref 26.0–34.0)
MCHC: 30.4 g/dL (ref 30.0–36.0)
MCV: 77.8 fL — ABNORMAL LOW (ref 80.0–100.0)
Monocytes Absolute: 1 10*3/uL (ref 0.1–1.0)
Monocytes Relative: 7 %
Neutro Abs: 9.9 10*3/uL — ABNORMAL HIGH (ref 1.7–7.7)
Neutrophils Relative %: 70 %
Platelets: 749 10*3/uL — ABNORMAL HIGH (ref 150–400)
RBC: 3.6 MIL/uL — ABNORMAL LOW (ref 3.87–5.11)
RDW: 16.7 % — ABNORMAL HIGH (ref 11.5–15.5)
WBC: 14.3 10*3/uL — ABNORMAL HIGH (ref 4.0–10.5)
nRBC: 0 % (ref 0.0–0.2)

## 2021-05-06 LAB — RESP PANEL BY RT-PCR (FLU A&B, COVID) ARPGX2
Influenza A by PCR: NEGATIVE
Influenza B by PCR: NEGATIVE
SARS Coronavirus 2 by RT PCR: NEGATIVE

## 2021-05-06 LAB — MAGNESIUM: Magnesium: 1.8 mg/dL (ref 1.7–2.4)

## 2021-05-06 LAB — PHOSPHORUS: Phosphorus: 2.4 mg/dL — ABNORMAL LOW (ref 2.5–4.6)

## 2021-05-06 MED ORDER — KETOROLAC TROMETHAMINE 15 MG/ML IJ SOLN
15.0000 mg | Freq: Once | INTRAMUSCULAR | Status: AC
  Administered 2021-05-06: 15 mg via INTRAVENOUS
  Filled 2021-05-06: qty 1

## 2021-05-06 MED ORDER — VANCOMYCIN HCL IN DEXTROSE 1-5 GM/200ML-% IV SOLN
1000.0000 mg | Freq: Once | INTRAVENOUS | Status: AC
  Administered 2021-05-06: 1000 mg via INTRAVENOUS
  Filled 2021-05-06: qty 200

## 2021-05-06 MED ORDER — SODIUM CHLORIDE 0.9 % IV SOLN
INTRAVENOUS | Status: DC | PRN
  Administered 2021-05-06: 10 mL via INTRAVENOUS

## 2021-05-06 MED ORDER — MORPHINE SULFATE (PF) 4 MG/ML IV SOLN
4.0000 mg | Freq: Once | INTRAVENOUS | Status: AC
  Administered 2021-05-06: 4 mg via INTRAVENOUS
  Filled 2021-05-06: qty 1

## 2021-05-06 MED ORDER — SODIUM CHLORIDE 0.9 % IV SOLN
2.0000 g | Freq: Three times a day (TID) | INTRAVENOUS | Status: DC
  Administered 2021-05-06 – 2021-05-08 (×5): 2 g via INTRAVENOUS
  Filled 2021-05-06 (×6): qty 2

## 2021-05-06 MED ORDER — IOHEXOL 300 MG/ML  SOLN
100.0000 mL | Freq: Once | INTRAMUSCULAR | Status: AC | PRN
  Administered 2021-05-06: 100 mL via INTRAVENOUS

## 2021-05-06 MED ORDER — VANCOMYCIN HCL 1500 MG/300ML IV SOLN
1500.0000 mg | Freq: Two times a day (BID) | INTRAVENOUS | Status: DC
  Administered 2021-05-07 – 2021-05-08 (×4): 1500 mg via INTRAVENOUS
  Filled 2021-05-06 (×5): qty 300

## 2021-05-06 MED ORDER — SODIUM CHLORIDE 0.9 % IV SOLN
2.0000 g | Freq: Once | INTRAVENOUS | Status: AC
  Administered 2021-05-06: 2 g via INTRAVENOUS
  Filled 2021-05-06: qty 2

## 2021-05-06 MED ORDER — SODIUM CHLORIDE 0.9 % IV SOLN: INTRAVENOUS | Status: DC | PRN

## 2021-05-06 MED ORDER — ONDANSETRON HCL 4 MG/2ML IJ SOLN
4.0000 mg | Freq: Once | INTRAMUSCULAR | Status: AC
  Administered 2021-05-06: 4 mg via INTRAVENOUS
  Filled 2021-05-06: qty 2

## 2021-05-06 MED ORDER — SODIUM CHLORIDE 0.9 % IV SOLN: INTRAVENOUS | Status: AC

## 2021-05-06 NOTE — Progress Notes (Signed)
Pharmacy Antibiotic Note  Amanda Davenport is a 41 y.o. female admitted on 05/06/2021 with sepsis.  Pharmacy has been consulted for vancomycin and cefepime dosing. Pt is afebrile but WBC is elevated at 14.3. Scr is WNL and lactic acid is <2.   Plan: Vancomycin 2gm IV x 1 then 1500mg  IV Q12H Cefepime 2g IV Q8H F/u renal fxn, C&S, clinical status and peak/trough at SS  Height: 5\' 10"  (177.8 cm) Weight: 130.2 kg (287 lb 0.6 oz) IBW/kg (Calculated) : 68.5  Temp (24hrs), Avg:99.3 F (37.4 C), Min:99.3 F (37.4 C), Max:99.3 F (37.4 C)  Recent Labs  Lab 05/06/21 1007  WBC 14.3*  CREATININE 0.74  LATICACIDVEN 1.7    Estimated Creatinine Clearance: 137.5 mL/min (by C-G formula based on SCr of 0.74 mg/dL).    Allergies  Allergen Reactions   Other Other (See Comments)    All Antibiotics cause severe vaginal yeast infections   Penicillins Hives, Itching, Swelling and Rash    Has patient had a PCN reaction causing immediate rash, facial/tongue/throat swelling, SOB or lightheadedness with hypotension: Yes Has patient had a PCN reaction causing severe rash involving mucus membranes or skin necrosis: Yes Has patient had a PCN reaction that required hospitalization Yes Has patient had a PCN reaction occurring within the last 10 years: Yes If all of the above answers are "NO", then may proceed with Cephalosporin use.  Has patient had a PCN reaction causing immediate rash, facial/tongue/throat swelling, SOB or lightheadedness with hypotension: Yes Has patient had a PCN reaction causing severe rash involving mucus membranes or skin necrosis: Yes Has patient had a PCN reaction that required hospitalization Yes Has patient had a PCN reaction occurring within the last 10 years: Yes If all of the above answers are "NO", then may proceed with Cephalosporin use. Has patient had a PCN reaction causing immediate rash, facial/tongue/throat swelling, SOB or lightheadedness with hypotension: Yes Has  patient had a PCN reaction causing severe rash involving mucus membranes or skin necrosis: Yes Has patient had a PCN reaction that required hospitalization Yes Has patient had a PCN reaction occurring within the last 10 years: Yes If all of the above answers are "NO", then may proceed with Cephalosporin use.   Shellfish-Derived Products Hives   Shrimp Extract Allergy Skin Test    Shrimp [Shellfish Allergy] Hives   Sulfa Antibiotics Rash    Antimicrobials this admission: Vanc 1/31>> Cefepime 1/31>>  Dose adjustments this admission: N/A  Microbiology results: Pending  Thank you for allowing pharmacy to be a part of this patients care.  Shilo Pauwels, Rande Lawman 05/06/2021 11:03 AM

## 2021-05-06 NOTE — ED Triage Notes (Signed)
Concern for bleeding since November  Unknown location, possible vaginal Leg soreness Weakness and dizziness Humira started in September for Hydradenitis On 3 antibiotics currently

## 2021-05-06 NOTE — Progress Notes (Signed)
Plan of care note.  Plan of Care Note for accepted transfer  Patient: Amanda Davenport    KIY:[id number]  DOA: 05/06/2021     Facility requesting transfer: [facility]. Requesting Provider: Campbell Stall, DO Reason for transfer: Sepsis due to cellulitis. Facility course: 41 year old female with a past medical history of class III obesity, COVID-19, history of facial cellulitis, iron deficiency anemia, peripheral neuropathy, Wolf Parkinson White syndrome, hidradenitis suppurativa who was started on Humira by dermatologist in November 2022 without significant results coming to the emergency department with over [place] infected wounds under her pannus, inguinal and gluteal clefts that has not responded to 2 different antibiotics given to her by her PCP.  Her presentation met sepsis criteria.  Her blood pressures continues to be soft.  She was started on vancomycin and cefepime in the emergency department.  Accepted to PCU bed.  Plan of care: The patient is accepted for admission to Progressive unit, at [facility].  Author: Reubin Milan, MD  05/06/2021  Check [email or web address] for on-call coverage.  Nursing staff, Please call [place] number on Amion as soon as patient's arrival, so appropriate admitting provider can evaluate the pt.

## 2021-05-06 NOTE — ED Provider Notes (Signed)
[facility] Provider Note   CSN: [id number] Arrival date & time: 05/06/21  0941     History  Chief Complaint  Patient presents with   bleeding   Wound Infection    Amanda Davenport is a 41 y.o. female.  Pt is a 41 yo female with pmh of hydradenitis suppurata stage 3 presenting for multiple severe abscess. Pt has over 30 infected wounds under pannus, inguinal cleft, and gluteal cleft. Majority of these abscesses are draining purulent fluid and bleeding. Pt states she saw dermatologist once in Novemeber 2022 in which she was started on [place] Pt recently taken off last week due to development of multiple more HA sites. States she was given three antibiotics by pcp including flagyl, ciprofloxacin, and * x 4 days.   The history is provided by the patient. No language interpreter was used.      Home Medications Prior to Admission medications   Medication Sig Start Date End Date Taking? Authorizing Provider  colchicine 0.6 MG tablet TAKE 1 TABLET (0.6 MG TOTAL) BY MOUTH EVERY 8 (EIGHT) HOURS AS NEEDED (FOR GOUTY ATTACKS). 11/20/19   Gildardo Pounds, NP  febuxostat (ULORIC) 40 MG tablet TAKE 2 TABLETS(80 MG) BY MOUTH DAILY 05/16/20   Vevelyn Francois, NP  gabapentin (NEURONTIN) 600 MG tablet Take 1 tablet (600 mg total) by mouth 3 (three) times daily. 12/28/19 12/27/20  Vevelyn Francois, NP  ibuprofen (ADVIL) 800 MG tablet TAKE 1 TABLET BY MOUTH EVERY 8 HOURS AS NEEDED FOR MODERATE PAIN. EAT BEFORE TAKING MEDICATION 03/14/20   Charlott Rakes, MD  lidocaine (LIDODERM) 5 % Place 1 patch onto the skin daily. Remove & Discard patch within 12 hours or as directed by MD 11/22/20   Henderly, Britni A, PA-C  metoprolol succinate (TOPROL XL) 25 MG 24 hr tablet Take 1 tablet (25 mg total) by mouth daily. 06/17/20   Evans Lance, MD  ondansetron (ZOFRAN) 4 MG tablet TAKE 1 TABLET(4 MG) BY MOUTH EVERY 8 HOURS AS NEEDED FOR NAUSEA OR VOMITING 04/02/20   Azzie Glatter, FNP   triamcinolone cream (KENALOG) 0.1 % Apply 1 application topically 2 (two) times daily. 12/06/19   Vevelyn Francois, NP      Allergies    Other, Penicillins, Shellfish-derived products, Shrimp extract allergy skin test, Shrimp [shellfish allergy], and Sulfa antibiotics    Review of Systems   Review of Systems  Constitutional:  Negative for chills and fever.  HENT:  Negative for ear pain and sore throat.   Eyes:  Negative for pain and visual disturbance.  Respiratory:  Negative for cough and shortness of breath.   Cardiovascular:  Negative for chest pain and palpitations.  Gastrointestinal:  Positive for abdominal pain. Negative for vomiting.  Genitourinary:  Positive for vaginal pain. Negative for dysuria and hematuria.       Pain in buttocks   Musculoskeletal:  Negative for arthralgias and back pain.  Skin:  Negative for color change and rash.  Neurological:  Negative for seizures and syncope.  All other systems reviewed and are negative.  Physical Exam Updated Vital Signs BP (!) 102/51    Pulse (!) 105    Temp 99.3 F (37.4 C)    Resp 17    Ht 5\' 10"  (1.778 m)    Wt 130.2 kg    LMP 04/30/2021 (Approximate)    SpO2 99%    BMI 41.19 kg/m  Physical Exam Vitals and nursing note reviewed.  Constitutional:  General: She is not in acute distress.    Appearance: She is well-developed.  HENT:     Head: Normocephalic and atraumatic.  Eyes:     Conjunctiva/sclera: Conjunctivae normal.  Cardiovascular:     Rate and Rhythm: Regular rhythm. Tachycardia present.     Heart sounds: No murmur heard. Pulmonary:     Effort: Pulmonary effort is normal. No respiratory distress.     Breath sounds: Normal breath sounds.  Abdominal:     Palpations: Abdomen is soft.     Tenderness: There is no abdominal tenderness.  Musculoskeletal:        General: No swelling.     Cervical back: Neck supple.  Skin:    General: Skin is warm and dry.     Capillary Refill: Capillary refill takes less than 2  seconds.     Findings: Wound present.     Comments: Over 30 infected wounds under pannus, inguinal cleft, and gluteal cleft. Majority of these abscesses are draining purulent fluid and bleeding. Fluctuance, warmth, and swelling present.   Neurological:     Mental Status: She is alert.  Psychiatric:        Mood and Affect: Mood normal.           ED Results / Procedures / Treatments   Labs (all labs ordered are listed, but only abnormal results are displayed) Labs Reviewed  CBC WITH DIFFERENTIAL/PLATELET - Abnormal; Notable for the following components:      Result Value   WBC 14.3 (*)    RBC 3.60 (*)    Hemoglobin 8.5 (*)    HCT 28.0 (*)    MCV 77.8 (*)    MCH 23.6 (*)    RDW 16.7 (*)    Platelets 749 (*)    Neutro Abs 9.9 (*)    All other components within normal limits  CULTURE, BLOOD (ROUTINE X 2)  CULTURE, BLOOD (ROUTINE X 2)  LACTIC ACID, PLASMA  LACTIC ACID, PLASMA  COMPREHENSIVE METABOLIC PANEL  URINALYSIS, ROUTINE W REFLEX MICROSCOPIC  PREGNANCY, URINE    EKG EKG Interpretation  Date/Time:  Tuesday May 06 2021 09:51:10 EST Ventricular Rate:  139 PR Interval:  128 QRS Duration: 78 QT Interval:  302 QTC Calculation: 459 R Axis:   31 Text Interpretation: Sinus tachycardia Otherwise normal ECG When compared with ECG of 01-Jun-2019 02:35, Wolff-Parkinson-White is no longer Present Confirmed by Campbell Stall ([id number]) on 05/08/5425 10:28:53 AM  Radiology No results found.  Procedures .Critical Care Performed by: Lianne Cure, DO Authorized by: Lianne Cure, DO   Critical care provider statement:    Critical care time (minutes):  32   Critical care was necessary to treat or prevent imminent or life-threatening deterioration of the following conditions:  Sepsis (sepsis)   Critical care was time spent personally by me on the following activities:  Development of treatment plan with patient or surrogate, discussions with consultants, evaluation of  patient's response to treatment, examination of patient, ordering and review of laboratory studies, ordering and review of radiographic studies, ordering and performing treatments and interventions, pulse oximetry, re-evaluation of patient's condition and review of old charts    Medications Ordered in ED Medications - No data to display  ED Course/ Medical Decision Making/ A&P                           Medical Decision Making Amount and/or Complexity of Data Reviewed Labs: ordered. Radiology: ordered.  Risk Prescription drug management. Decision regarding hospitalization.   41 yo female with pmh of hydradenitis suppurata stage 3 presenting for multiple worsening sites of HA.   Physical exam demonstrates over 30 infected wounds under pannus, inguinal cleft, and gluteal cleft. Majority of these abscesses are draining purulent fluid and bleeding. Fluctuance, warmth, and swelling present.   Tachycardia of 137 present. Concerns for sepsis. Blood cx sent.  IV fluids and Vanc and Cefepime started.   CT abdomen/pelvis ordered to rule out deeper abscesses.   2:09 PM CT abdomen/pelvis demonstrates no acute process.   Patient accepted by Dr. Olevia Bowens. Will consult ID and general surgery.        Final Clinical Impression(s) / ED Diagnoses Final diagnoses:  Sepsis, due to unspecified organism, unspecified whether acute organ dysfunction present [facility])  Hidradenitis suppurativa  Abscess    Rx / DC Orders ED Discharge Orders     None         Lianne Cure, DO 81/15/72 1416

## 2021-05-07 DIAGNOSIS — L0291 Cutaneous abscess, unspecified: Secondary | ICD-10-CM

## 2021-05-07 DIAGNOSIS — L732 Hidradenitis suppurativa: Secondary | ICD-10-CM | POA: Diagnosis not present

## 2021-05-07 DIAGNOSIS — L0390 Cellulitis, unspecified: Secondary | ICD-10-CM | POA: Diagnosis not present

## 2021-05-07 DIAGNOSIS — A419 Sepsis, unspecified organism: Secondary | ICD-10-CM | POA: Diagnosis not present

## 2021-05-07 DIAGNOSIS — Z8619 Personal history of other infectious and parasitic diseases: Secondary | ICD-10-CM

## 2021-05-07 HISTORY — DX: Personal history of other infectious and parasitic diseases: Z86.19

## 2021-05-07 LAB — CBC
HCT: 25.8 % — ABNORMAL LOW (ref 36.0–46.0)
Hemoglobin: 7.8 g/dL — ABNORMAL LOW (ref 12.0–15.0)
MCH: 24.2 pg — ABNORMAL LOW (ref 26.0–34.0)
MCHC: 30.2 g/dL (ref 30.0–36.0)
MCV: 80.1 fL (ref 80.0–100.0)
Platelets: 615 10*3/uL — ABNORMAL HIGH (ref 150–400)
RBC: 3.22 MIL/uL — ABNORMAL LOW (ref 3.87–5.11)
RDW: 16.7 % — ABNORMAL HIGH (ref 11.5–15.5)
WBC: 10.4 10*3/uL (ref 4.0–10.5)
nRBC: 0 % (ref 0.0–0.2)

## 2021-05-07 LAB — IRON AND TIBC
Iron: 27 ug/dL — ABNORMAL LOW (ref 28–170)
Saturation Ratios: 14 % (ref 10.4–31.8)
TIBC: 188 ug/dL — ABNORMAL LOW (ref 250–450)
UIBC: 161 ug/dL

## 2021-05-07 LAB — HIV ANTIBODY (ROUTINE TESTING W REFLEX): HIV Screen 4th Generation wRfx: NONREACTIVE

## 2021-05-07 LAB — HEMOGLOBIN AND HEMATOCRIT, BLOOD
HCT: 25.6 % — ABNORMAL LOW (ref 36.0–46.0)
Hemoglobin: 7.7 g/dL — ABNORMAL LOW (ref 12.0–15.0)

## 2021-05-07 MED ORDER — ACETAMINOPHEN 325 MG PO TABS
650.0000 mg | ORAL_TABLET | Freq: Four times a day (QID) | ORAL | Status: DC | PRN
  Administered 2021-05-07 – 2021-05-14 (×6): 650 mg via ORAL
  Filled 2021-05-07 (×6): qty 2

## 2021-05-07 MED ORDER — METRONIDAZOLE 500 MG/100ML IV SOLN
500.0000 mg | Freq: Two times a day (BID) | INTRAVENOUS | Status: DC
  Administered 2021-05-07 – 2021-05-08 (×3): 500 mg via INTRAVENOUS
  Filled 2021-05-07 (×3): qty 100

## 2021-05-07 MED ORDER — CHLORHEXIDINE GLUCONATE CLOTH 2 % EX PADS
6.0000 | MEDICATED_PAD | Freq: Every day | CUTANEOUS | Status: DC
  Administered 2021-05-07 – 2021-05-14 (×8): 6 via TOPICAL

## 2021-05-07 MED ORDER — MORPHINE SULFATE (PF) 2 MG/ML IV SOLN
2.0000 mg | INTRAVENOUS | Status: DC | PRN
  Administered 2021-05-07 – 2021-05-14 (×29): 2 mg via INTRAVENOUS
  Filled 2021-05-07 (×29): qty 1

## 2021-05-07 MED ORDER — METOPROLOL SUCCINATE ER 25 MG PO TB24
25.0000 mg | ORAL_TABLET | Freq: Every day | ORAL | Status: DC
  Administered 2021-05-07 – 2021-05-12 (×4): 25 mg via ORAL
  Filled 2021-05-07 (×7): qty 1

## 2021-05-07 MED ORDER — ENSURE MAX PROTEIN PO LIQD
11.0000 [oz_av] | Freq: Every day | ORAL | Status: DC
  Administered 2021-05-07 – 2021-05-13 (×6): 11 [oz_av] via ORAL
  Filled 2021-05-07 (×8): qty 330

## 2021-05-07 MED ORDER — HYDROXYZINE HCL 10 MG PO TABS
5.0000 mg | ORAL_TABLET | Freq: Once | ORAL | Status: AC
  Administered 2021-05-07: 5 mg via ORAL
  Filled 2021-05-07: qty 1

## 2021-05-07 MED ORDER — JUVEN PO PACK
1.0000 | PACK | Freq: Two times a day (BID) | ORAL | Status: DC
  Administered 2021-05-07 – 2021-05-14 (×13): 1 via ORAL
  Filled 2021-05-07 (×14): qty 1

## 2021-05-07 MED ORDER — HYDROXYZINE HCL 10 MG PO TABS
5.0000 mg | ORAL_TABLET | Freq: Three times a day (TID) | ORAL | Status: DC | PRN
  Administered 2021-05-07 – 2021-05-14 (×7): 5 mg via ORAL
  Filled 2021-05-07 (×11): qty 1

## 2021-05-07 NOTE — H&P (Signed)
History and Physical    Amanda Davenport:[id number] DOB: Aug 09, 1980 DOA: 05/06/2021  PCP: Cipriano Mile, NP  Patient coming from: [place] [facility]  I have personally briefly reviewed patient's old medical records in [place]  Chief Complaint: Worsening hidradenitis suppurativa  HPI: Amanda Davenport is a 41 y.o. female with medical history significant for history hidradenitis suppurativa previously on Humira, iron deficiency anemia requiring as needed IV iron infusion, Wolff-Parkinson-White syndrome, morbid obesity who presents with concerns of worsening hidradenitis suppurativa.  Patient was evaluated by dermatology for flareup of her hidradenitis suppurativa under her breast back in September and was started on Humira.  However had minimal improvement.  She began to notice new abscess on her gluteal cleft.  She also continues to have numerous draining abscesses to her right axillary region.  Open wounds under bilateral breast and in her pannus region.  Her dermatologist advised her to stop Humira and put her on Flagyl, moxifloxacin and rifampin earlier this week with minimal relief.  ED Course: She had temperature 99.73F, tachycardic, tachypneic and intermittently hypotensive down to 80s although improved without any intervention.  Leukocytosis of 14.3, hemoglobin 8.5 from a prior of 11.2, platelet of 746 which appears to be chronic for her. CMP unremarkable.  CT of the abdomen and pelvis was negative for any acute process. She was started on IV vancomycin and cefepime and transfer to [facility] for further management.   Review of Systems:  Pertinent negatives and positives stated above in the HPI  Past Medical History:  Diagnosis Date   Allergy    Anemia    receives transfusions periodically   Anxiety    Arrhythmia    Chronic headache    Depression    Dysrhythmia    Gout 12/2018   Knee pain    Nearsightedness    wears glasses   Neuropathy    Obesity    Pneumonia     Pre-diabetes    Recurrent boils    WPW (Wolff-Parkinson-White syndrome)     Past Surgical History:  Procedure Laterality Date   ADJACENT TISSUE TRANSFER/TISSUE REARRANGEMENT Left 07/28/2019   Procedure: ADJACENT TISSUE TRANSFER TO LEFT AXILLA GREATER THAN 100 CM SQUARED;  Surgeon: Irene Limbo, MD;  Location: [facility];  Service: Plastics;  Laterality: Left;   HYDRADENITIS EXCISION Left 07/28/2019   Procedure: EXCISION LEFT HIDRADENITIS AXILLA;  Surgeon: Clovis Riley, MD;  Location: [facility];  Service: General;  Laterality: Left;   TONSILLECTOMY       reports that she quit smoking about 2 years ago. Her smoking use included cigarettes and cigars. She has never used smokeless tobacco. She reports current drug use. Drug: Marijuana. She reports that she does not drink alcohol. Social History  Allergies  Allergen Reactions   Other Other (See Comments)    All Antibiotics cause severe vaginal yeast infections   Penicillins Hives, Itching, Swelling and Rash    Has patient had a PCN reaction causing immediate rash, facial/tongue/throat swelling, SOB or lightheadedness with hypotension: Yes Has patient had a PCN reaction causing severe rash involving mucus membranes or skin necrosis: Yes Has patient had a PCN reaction that required hospitalization Yes Has patient had a PCN reaction occurring within the last 10 years: Yes If all of the above answers are "NO", then may proceed with Cephalosporin use.  Has patient had a PCN reaction causing immediate rash, facial/tongue/throat swelling, SOB or lightheadedness with hypotension: Yes Has patient had a PCN reaction causing severe rash involving mucus  membranes or skin necrosis: Yes Has patient had a PCN reaction that required hospitalization Yes Has patient had a PCN reaction occurring within the last 10 years: Yes If all of the above answers are "NO", then may proceed with Cephalosporin use. Has patient had a PCN reaction causing immediate rash,  facial/tongue/throat swelling, SOB or lightheadedness with hypotension: Yes Has patient had a PCN reaction causing severe rash involving mucus membranes or skin necrosis: Yes Has patient had a PCN reaction that required hospitalization Yes Has patient had a PCN reaction occurring within the last 10 years: Yes If all of the above answers are "NO", then may proceed with Cephalosporin use.   Shellfish-Derived Products Hives   Shrimp Extract Allergy Skin Test    Shrimp [Shellfish Allergy] Hives   Sulfa Antibiotics Rash    Family History  Problem Relation Age of Onset   Breast cancer Mother    Pulmonary embolism Mother        died of PE   Colon cancer Mother    Irritable bowel syndrome Mother    Cancer Mother    Hypertension Father    Diabetes Paternal Grandmother    Heart disease Neg Hx    Stroke Neg Hx      Prior to Admission medications   Medication Sig Start Date End Date Taking? Authorizing Provider  acetaminophen-codeine (TYLENOL #4) 300-60 MG tablet Take 1 tablet by mouth every 4 (four) hours as needed. 05/01/21  Yes [provider]  ibuprofen (ADVIL) 800 MG tablet TAKE 1 TABLET BY MOUTH EVERY 8 HOURS AS NEEDED FOR MODERATE PAIN. EAT BEFORE TAKING MEDICATION 03/14/20  Yes Charlott Rakes, MD  metoprolol succinate (TOPROL XL) 25 MG 24 hr tablet Take 1 tablet (25 mg total) by mouth daily. 06/17/20  Yes Evans Lance, MD  metroNIDAZOLE (FLAGYL) 500 MG tablet Take 500 mg by mouth 3 (three) times daily. 04/30/21  Yes [provider]  moxifloxacin (AVELOX) 400 MG tablet Take 400 mg by mouth daily. 04/30/21  Yes [provider]  ondansetron (ZOFRAN) 4 MG tablet TAKE 1 TABLET(4 MG) BY MOUTH EVERY 8 HOURS AS NEEDED FOR NAUSEA OR VOMITING 04/02/20  Yes Azzie Glatter, FNP  rifampin (RIFADIN) 150 MG capsule Take 150 mg by mouth 2 (two) times daily. 05/02/21  Yes [provider]  Vitamin D, Ergocalciferol, (DRISDOL) 1.25 MG (50000 UNIT) CAPS capsule Take  50,000 Units by mouth once a week. 03/16/21  Yes [provider]  colchicine 0.6 MG tablet TAKE 1 TABLET (0.6 MG TOTAL) BY MOUTH EVERY 8 (EIGHT) HOURS AS NEEDED (FOR GOUTY ATTACKS). Patient not taking: Reported on 05/06/2021 11/20/19   Gildardo Pounds, NP  febuxostat (ULORIC) 40 MG tablet TAKE 2 TABLETS(80 MG) BY MOUTH DAILY Patient not taking: Reported on 05/06/2021 05/16/20   Vevelyn Francois, NP  gabapentin (NEURONTIN) 600 MG tablet Take 1 tablet (600 mg total) by mouth 3 (three) times daily. 12/28/19 12/27/20  Vevelyn Francois, NP  lidocaine (LIDODERM) 5 % Place 1 patch onto the skin daily. Remove & Discard patch within 12 hours or as directed by MD Patient not taking: Reported on 05/06/2021 11/22/20   Henderly, Britni A, PA-C  triamcinolone cream (KENALOG) 0.1 % Apply 1 application topically 2 (two) times daily. Patient not taking: Reported on 05/06/2021 12/06/19   Vevelyn Francois, NP    Physical Exam: Vitals:   05/06/21 1830 05/06/21 1845 05/06/21 1951 05/07/21 0000  BP: (!) 110/59  115/80 114/65  Pulse: 99 95 Marland Kitchen)  101 95  Resp: (!) 31 17 20 15   Temp:   98.8 F (37.1 C) 98 F (36.7 C)  TempSrc:   Oral Oral  SpO2: 99% 100% 99% 100%  Weight:      Height:        Constitutional: NAD, calm, comfortable, morbidly obese female lying flat in bed Vitals:   05/06/21 1830 05/06/21 1845 05/06/21 1951 05/07/21 0000  BP: (!) 110/59  115/80 114/65  Pulse: 99 95 (!) 101 95  Resp: (!) 31 17 20 15   Temp:   98.8 F (37.1 C) 98 F (36.7 C)  TempSrc:   Oral Oral  SpO2: 99% 100% 99% 100%  Weight:      Height:       Eyes: lids and conjunctivae normal ENMT: Mucous membranes are moist.  Neck: normal, supple Respiratory: clear to auscultation bilaterally, no wheezing, no crackles. Normal respiratory effort.  Cardiovascular: Regular rate and rhythm, no murmurs / rubs / gallops. No extremity edema.  Abdomen: no tenderness, no masses palpated.  Bowel sounds positive.  Musculoskeletal: no clubbing  / cyanosis. No joint deformity upper and lower extremities.  Normal muscle tone.  Skin:  Right axillary: several actively draining purulent abscess with rope like tracks Bilateral breast fold: open superficial wounds scattered beneath left breast fold, papules scattered beneath right breast Inguinal/groin: Innumerable scattered papules and pustules Gluteal cleft: Several large fluctuant abscess without drainage Bilateral LE: a few scattered pustules          Neurologic: CN 2-12 grossly intact.Strength 5/5 in all 4.  Psychiatric: Normal judgment and insight. Alert and oriented x 3. Normal mood.    Labs on Admission: I have personally reviewed following labs and imaging studies  CBC: Recent Labs  Lab 05/06/21 1007  WBC 14.3*  NEUTROABS 9.9*  HGB 8.5*  HCT 28.0*  MCV 77.8*  PLT 801*   Basic Metabolic Panel: Recent Labs  Lab 05/06/21 1007  NA 135  K 3.6  CL 101  CO2 22  GLUCOSE 121*  BUN 7  CREATININE 0.74  CALCIUM 9.0  MG 1.8  PHOS 2.4*   GFR: Estimated Creatinine Clearance: 137.5 mL/min (by C-G formula based on SCr of 0.74 mg/dL). Liver Function Tests: Recent Labs  Lab 05/06/21 1007  AST 15  ALT 5  ALKPHOS 64  BILITOT 0.3  PROT 9.2*  ALBUMIN 3.2*   No results for input(s): LIPASE, AMYLASE in the last 168 hours. No results for input(s): AMMONIA in the last 168 hours. Coagulation Profile: No results for input(s): INR, PROTIME in the last 168 hours. Cardiac Enzymes: No results for input(s): CKTOTAL, CKMB, CKMBINDEX, TROPONINI in the last 168 hours. BNP (last 3 results) No results for input(s): PROBNP in the last 8760 hours. HbA1C: No results for input(s): HGBA1C in the last 72 hours. CBG: No results for input(s): GLUCAP in the last 168 hours. Lipid Profile: No results for input(s): CHOL, HDL, LDLCALC, TRIG, CHOLHDL, LDLDIRECT in the last 72 hours. Thyroid Function Tests: No results for input(s): TSH, T4TOTAL, FREET4, T3FREE, THYROIDAB in the last  72 hours. Anemia Panel: No results for input(s): VITAMINB12, FOLATE, FERRITIN, TIBC, IRON, RETICCTPCT in the last 72 hours. Urine analysis:    Component Value Date/Time   COLORURINE YELLOW 02/23/2019 [place] 02/23/2019 0446   LABSPEC 1.016 02/23/2019 0446   PHURINE 5.0 02/23/2019 0446   GLUCOSEU NEGATIVE 02/23/2019 0446   HGBUR NEGATIVE 02/23/2019 0446   BILIRUBINUR small 03/14/2019 [place] 02/23/2019  0446   PROTEINUR Positive (A) 03/14/2019 0846   PROTEINUR NEGATIVE 02/23/2019 0446   UROBILINOGEN 0.2 03/14/2019 0846   UROBILINOGEN 0.2 06/03/2017 1416   NITRITE Negative 03/14/2019 0846   NITRITE NEGATIVE 02/23/2019 0446   LEUKOCYTESUR Negative 03/14/2019 0846   LEUKOCYTESUR MODERATE (A) 02/23/2019 0446    Radiological Exams on Admission: CT ABDOMEN PELVIS W CONTRAST  Result Date: 05/06/2021 CLINICAL DATA:  Abscess. EXAM: CT ABDOMEN AND PELVIS WITH CONTRAST TECHNIQUE: Multidetector CT imaging of the abdomen and pelvis was performed using the standard protocol following bolus administration of intravenous contrast. RADIATION DOSE REDUCTION: This exam was performed according to the departmental dose-optimization program which includes automated exposure control, adjustment of the mA and/or kV according to patient size and/or use of iterative reconstruction technique. CONTRAST:  166mL OMNIPAQUE IOHEXOL 300 MG/ML  SOLN COMPARISON:  August 02, 2012. FINDINGS: Lower chest: No acute abnormality. Hepatobiliary: No focal liver abnormality is seen. No gallstones, gallbladder wall thickening, or biliary dilatation. Pancreas: Unremarkable. No pancreatic ductal dilatation or surrounding inflammatory changes. Spleen: Normal in size without focal abnormality. Adrenals/Urinary Tract: Adrenal glands are unremarkable. Kidneys are normal, without renal calculi, focal lesion, or hydronephrosis. Bladder is unremarkable. Stomach/Bowel: Stomach is within normal limits. Appendix  appears normal. No evidence of bowel wall thickening, distention, or inflammatory changes. Vascular/Lymphatic: No significant vascular findings are present. No enlarged abdominal or pelvic lymph nodes. Reproductive: Uterus and bilateral adnexa are unremarkable. Other: No abdominal wall hernia or abnormality. No abdominopelvic ascites. Musculoskeletal: No acute or significant osseous findings. IMPRESSION: No acute abnormality seen in the abdomen or pelvis. Electronically Signed   By: Marijo Conception M.D.   On: 05/06/2021 12:54      Assessment/Plan  Sepsis secondary to abscess from Stage 2 hiradenitis supprativa  -presented with echocardiac, tachypnea and leukocytosis -pt with innumerable abscess mostly in the right axillary region, lesions to bilateral breast fold, groin, gluteal cleft  -Continue IV Vancomycin and Cefepime. Add Flagyl for anaerobic coverage. Consider ID consult to guide antibiotic therapy -Need surgical consult for I&D  Intermittent hypotension -suspect these readings are erroneous because she would improve without intervention -continue IV fluid infusion overnight and monitor   Wolff-Parkinson-White syndrome - Continue metoprolol  Iron deficiency anemia Hemoglobin is down trended to 8.5 from prior of 11.2.  Has required IV iron transfusion in the past.  Check iron panel  Morbid obesity BMI of 41.  Encourage weight loss  DVT prophylaxis:SCD Code Status: Full Family Communication: Plan discussed with patient at bedside  disposition Plan: Home with at least 2 midnight stays  Consults called:  Admission status: inpatient  Level of care: Progressive  Status is: Inpatient Remains inpatient appropriate because: Admit - It is my clinical opinion that admission to INPATIENT is reasonable and necessary because this patient will require at least 2 midnights in the hospital to treat this condition based on the medical complexity of the problems presented.  Given the  aforementioned information, the predictability of an adverse outcome is felt to be significant.  Planned Discharge Destination: [place] T Reatha Sur DO [facility]   If 7PM-7AM, please contact night-coverage [email or web address]   05/07/2021, 12:41 AM

## 2021-05-07 NOTE — Consult Note (Signed)
Consult Note  Amanda Davenport December 21, 1980  [id number].    Requesting MD: Shawna Clamp, MD Chief Complaint/Reason for Consult: Hidradenitis suppurativa  HPI:  Patient is a 41 year old female who presented to the ED with what she reports to me as lightheadedness, dizziness, sob and bleeding that was coming down from her leg. She denies bloody stools. She is unsure of vaginal bleeding. She thinks it may have been coming from one of her wounds.   We were asked to see for hidradenitis suppurativa. She is followed by [facility] for this and previously was on Humira. Humira was started by dermatology back in September but she had minimal improvement. In November she developed lesions in her groin and buttocks that have been draining bloody/purulent fluid. Her dermatologist recommended she stop the Humira and put her on flagyl, moxifloxacin and rifampin earlier this week with minimal relief. She also has draining wounds in pannus, bilateral inframammary folds, and right axilla. No recent changes in her wounds since [place], she reports that they just have not improved. No new areas of swelling. No overlying skin redness. She has a hx of excision of the left axilla by Dr. Kae Heller followed by local flap closure by Dr. Iran Planas in 2021.   PMH otherwise significant for WPW syndrome, gout, iron deficiency anemia, depression/anxiety, and morbid obesity. She has allergies to PCNs, shellfish and sulfa antibiotics. She also reports vaginal yeast infections with most antibiotics. She is not on any blood thinners. Denies alcohol use, reports occasional marijuana and black and milds. WBC 10.4 today. Afebrile overnight.   ROS: Review of Systems  Constitutional:  Negative for chills and fever.  Cardiovascular:  Negative for chest pain.  Gastrointestinal:  Negative for abdominal pain, nausea and vomiting.  Genitourinary:        Foley in place  Skin:        Hidradenitis with active  draining wounds   All other systems reviewed and are negative.  Family History  Problem Relation Age of Onset   Breast cancer Mother    Pulmonary embolism Mother        died of PE   Colon cancer Mother    Irritable bowel syndrome Mother    Cancer Mother    Hypertension Father    Diabetes Paternal Grandmother    Heart disease Neg Hx    Stroke Neg Hx     Past Medical History:  Diagnosis Date   Allergy    Anemia    receives transfusions periodically   Anxiety    Arrhythmia    Chronic headache    Depression    Dysrhythmia    Gout 12/2018   Knee pain    Nearsightedness    wears glasses   Neuropathy    Obesity    Pneumonia    Pre-diabetes    Recurrent boils    WPW (Wolff-Parkinson-White syndrome)     Past Surgical History:  Procedure Laterality Date   ADJACENT TISSUE TRANSFER/TISSUE REARRANGEMENT Left 07/28/2019   Procedure: ADJACENT TISSUE TRANSFER TO LEFT AXILLA GREATER THAN 100 CM SQUARED;  Surgeon: Irene Limbo, MD;  Location: [facility];  Service: Plastics;  Laterality: Left;   HYDRADENITIS EXCISION Left 07/28/2019   Procedure: EXCISION LEFT HIDRADENITIS AXILLA;  Surgeon: Clovis Riley, MD;  Location: [facility];  Service: General;  Laterality: Left;   TONSILLECTOMY      Social History:  reports that she quit smoking about 2 years ago. Her smoking use  included cigarettes and cigars. She has never used smokeless tobacco. She reports current drug use. Drug: Marijuana. She reports that she does not drink alcohol.  Allergies:  Allergies  Allergen Reactions   Other Other (See Comments)    All Antibiotics cause severe vaginal yeast infections   Penicillins Hives, Itching, Swelling and Rash    Has patient had a PCN reaction causing immediate rash, facial/tongue/throat swelling, SOB or lightheadedness with hypotension: Yes Has patient had a PCN reaction causing severe rash involving mucus membranes or skin necrosis: Yes Has patient had a PCN reaction that required  hospitalization Yes Has patient had a PCN reaction occurring within the last 10 years: Yes If all of the above answers are "NO", then may proceed with Cephalosporin use.  Has patient had a PCN reaction causing immediate rash, facial/tongue/throat swelling, SOB or lightheadedness with hypotension: Yes Has patient had a PCN reaction causing severe rash involving mucus membranes or skin necrosis: Yes Has patient had a PCN reaction that required hospitalization Yes Has patient had a PCN reaction occurring within the last 10 years: Yes If all of the above answers are "NO", then may proceed with Cephalosporin use. Has patient had a PCN reaction causing immediate rash, facial/tongue/throat swelling, SOB or lightheadedness with hypotension: Yes Has patient had a PCN reaction causing severe rash involving mucus membranes or skin necrosis: Yes Has patient had a PCN reaction that required hospitalization Yes Has patient had a PCN reaction occurring within the last 10 years: Yes If all of the above answers are "NO", then may proceed with Cephalosporin use.   Shellfish-Derived Products Hives   Shrimp Extract Allergy Skin Test    Shrimp [Shellfish Allergy] Hives   Sulfa Antibiotics Rash    Medications Prior to Admission  Medication Sig Dispense Refill   acetaminophen-codeine (TYLENOL #4) 300-60 MG tablet Take 1 tablet by mouth every 4 (four) hours as needed.     ibuprofen (ADVIL) 800 MG tablet TAKE 1 TABLET BY MOUTH EVERY 8 HOURS AS NEEDED FOR MODERATE PAIN. EAT BEFORE TAKING MEDICATION 60 tablet 0   metoprolol succinate (TOPROL XL) 25 MG 24 hr tablet Take 1 tablet (25 mg total) by mouth daily. 90 tablet 3   metroNIDAZOLE (FLAGYL) 500 MG tablet Take 500 mg by mouth 3 (three) times daily.     moxifloxacin (AVELOX) 400 MG tablet Take 400 mg by mouth daily.     ondansetron (ZOFRAN) 4 MG tablet TAKE 1 TABLET(4 MG) BY MOUTH EVERY 8 HOURS AS NEEDED FOR NAUSEA OR VOMITING 20 tablet 0   rifampin (RIFADIN) 150  MG capsule Take 150 mg by mouth 2 (two) times daily.     Vitamin D, Ergocalciferol, (DRISDOL) 1.25 MG (50000 UNIT) CAPS capsule Take 50,000 Units by mouth once a week.     colchicine 0.6 MG tablet TAKE 1 TABLET (0.6 MG TOTAL) BY MOUTH EVERY 8 (EIGHT) HOURS AS NEEDED (FOR GOUTY ATTACKS). (Patient not taking: Reported on 05/06/2021) 30 tablet 3   febuxostat (ULORIC) 40 MG tablet TAKE 2 TABLETS(80 MG) BY MOUTH DAILY (Patient not taking: Reported on 05/06/2021) 60 tablet 1   gabapentin (NEURONTIN) 600 MG tablet Take 1 tablet (600 mg total) by mouth 3 (three) times daily. 90 tablet 11   lidocaine (LIDODERM) 5 % Place 1 patch onto the skin daily. Remove & Discard patch within 12 hours or as directed by MD (Patient not taking: Reported on 05/06/2021) 30 patch 0   triamcinolone cream (KENALOG) 0.1 % Apply 1 application topically 2 (  two) times daily. (Patient not taking: Reported on 05/06/2021) 456.6 g 0    Blood pressure (!) 100/53, pulse 86, temperature 98.3 F (36.8 C), temperature source Oral, resp. rate 20, height 5\' 10"  (1.778 m), weight 130.2 kg, last menstrual period 04/30/2021, SpO2 95 %. Physical Exam:  General: pleasant, WD/WN female who is laying in bed in NAD HEENT: head is normocephalic, atraumatic.  Sclera are noninjected.  PERRL.  Ears and nose without any masses or lesions.  Mouth is pink and moist Heart: regular, rate, and rhythm.  Normal s1,s2. No obvious murmurs, gallops, or rubs noted.  Palpable radial and pedal pulses bilaterally Lungs: CTAB, no wheezes, rhonchi, or rales noted.  Respiratory effort nonlabored XLK:GMWN, NT, ND, +BS, no masses, hernias, or organomegaly MS: all 4 extremities are symmetrical with no cyanosis, clubbing, or edema. Skin: Please see pictures below with multiple areas consistent with hidradenitis Neuro: Cranial nerves 2-12 grossly intact, sensation is normal throughout, MAE's. Gait not assessed.  Psych: A&Ox3 with an appropriate affect.  R axilla - there is  some hypergranulation tissue on the lateral aspect of the wound as noted below.  There is multiple areas of nodularity and area of chronic draining sinus track x 2. No obvious fluctuance, undrained abscess or overlying cellulitis   R breast/inframammary fold -no evidence of cellulitis or undrained abscess   Intergluteal cleft -on the left buttock there is 3 areas of hypergranulation tissue along with chronic draining sinus track x 2. No obvious fluctuance, undrained abscess or overlying cellulitis R buttock with wound as noted below. No obvious fluctuance, undrained abscess or overlying cellulitis    Additional picture of the right (superior aspect of the picture) and left buttock (inferior aspect of the picture)   Right groin -noted areas of hidradenitis with chronic draining sinus track. No obvious fluctuance, undrained abscess or overlying cellulitis   L groin   L inframammary fold   Left axilla    Results for orders placed or performed during the hospital encounter of 05/06/21 (from the past 48 hour(s))  Lactic acid, plasma     Status: None   Collection Time: 05/06/21 10:07 AM  Result Value Ref Range   Lactic Acid, Venous 1.7 0.5 - 1.9 mmol/L    Comment: Performed at [facility], [place], [place] [address]  Comprehensive metabolic panel     Status: Abnormal   Collection Time: 05/06/21 10:07 AM  Result Value Ref Range   Sodium 135 135 - 145 mmol/L   Potassium 3.6 3.5 - 5.1 mmol/L   Chloride 101 98 - 111 mmol/L   CO2 22 22 - 32 mmol/L   Glucose, Bld 121 (H) 70 - 99 mg/dL    Comment: Glucose reference range applies only to samples taken after fasting for at least 8 hours.   BUN 7 6 - 20 mg/dL   Creatinine, Ser 0.74 0.44 - 1.00 mg/dL   Calcium 9.0 8.9 - 10.3 mg/dL   Total Protein 9.2 (H) 6.5 - 8.1 g/dL   Albumin 3.2 (L) 3.5 - 5.0 g/dL   AST 15 15 - 41 U/L   ALT 5 0 - 44 U/L   Alkaline Phosphatase 64 38 - 126 U/L   Total Bilirubin  0.3 0.3 - 1.2 mg/dL   GFR, Estimated >60 >60 mL/min    Comment: (NOTE) Calculated using the CKD-EPI Creatinine Equation (2021)    Anion gap 12 5 - 15    Comment: Performed at [facility], [phone]  [address] [place], [place] [address]  CBC with Differential     Status: Abnormal   Collection Time: 05/06/21 10:07 AM  Result Value Ref Range   WBC 14.3 (H) 4.0 - 10.5 K/uL   RBC 3.60 (L) 3.87 - 5.11 MIL/uL   Hemoglobin 8.5 (L) 12.0 - 15.0 g/dL    Comment: Reticulocyte Hemoglobin testing may be clinically indicated, consider ordering this additional test [id number]    HCT 28.0 (L) 36.0 - 46.0 %   MCV 77.8 (L) 80.0 - 100.0 fL   MCH 23.6 (L) 26.0 - 34.0 pg   MCHC 30.4 30.0 - 36.0 g/dL   RDW 16.7 (H) 11.5 - 15.5 %   Platelets 749 (H) 150 - 400 K/uL   nRBC 0.0 0.0 - 0.2 %   Neutrophils Relative % 70 %   Neutro Abs 9.9 (H) 1.7 - 7.7 K/uL   Lymphocytes Relative 22 %   Lymphs Abs 3.1 0.7 - 4.0 K/uL   Monocytes Relative 7 %   Monocytes Absolute 1.0 0.1 - 1.0 K/uL   Eosinophils Relative 1 %   Eosinophils Absolute 0.2 0.0 - 0.5 K/uL   Basophils Relative 0 %   Basophils Absolute 0.1 0.0 - 0.1 K/uL   Immature Granulocytes 0 %   Abs Immature Granulocytes 0.06 0.00 - 0.07 K/uL    Comment: Performed at [facility], [address], [place], [place] [address]  Blood culture (routine x 2)     Status: None (Preliminary result)   Collection Time: 05/06/21 10:07 AM   Specimen: Right Antecubital; Blood  Result Value Ref Range   Specimen Description      RIGHT ANTECUBITAL Performed at [facility], [address], [place], [place] [address]    Special Requests      BOTTLES DRAWN AEROBIC ONLY Blood Culture adequate volume Performed at [facility], [address], [place], [place] [address]    Culture      NO GROWTH <12 HOURS Performed at [facility], [address], [place], [place] [address]    Report Status  PENDING   Blood culture (routine x 2)     Status: None (Preliminary result)   Collection Time: 05/06/21 10:07 AM   Specimen: BLOOD LEFT HAND  Result Value Ref Range   Specimen Description      BLOOD LEFT HAND Performed at [facility], [address] [place], [place] [address]    Special Requests      BOTTLES DRAWN AEROBIC AND ANAEROBIC Blood Culture adequate volume Performed at [facility], [address] [place], [place] [address]    Culture      NO GROWTH <12 HOURS Performed at [facility] [address], [place], [place] [address]    Report Status PENDING   Magnesium     Status: None   Collection Time: 05/06/21 10:07 AM  Result Value Ref Range   Magnesium 1.8 1.7 - 2.4 mg/dL    Comment: Performed at [facility], [address] [place], [place] [address]  Phosphorus     Status: Abnormal   Collection Time: 05/06/21 10:07 AM  Result Value Ref Range   Phosphorus 2.4 (L) 2.5 - 4.6 mg/dL    Comment: Performed at [facility], [address] [place], [place] [address]  Resp Panel by RT-PCR (Flu A&B, Covid) Nasopharyngeal Swab     Status: None   Collection Time: 05/06/21  2:30 PM   Specimen: Nasopharyngeal Swab; Nasopharyngeal(NP)  swabs in vial transport medium  Result Value Ref Range   SARS Coronavirus 2 by RT PCR NEGATIVE NEGATIVE    Comment: (NOTE) SARS-CoV-2 target nucleic acids are NOT DETECTED.  The SARS-CoV-2 RNA is generally detectable in upper respiratory specimens during the acute phase of infection. The lowest concentration of SARS-CoV-2 viral copies this assay can detect is 138 copies/mL. A negative result does not preclude SARS-Cov-2 infection and should not be used as the sole basis for treatment or other patient management decisions. A negative result may occur with  improper specimen collection/handling, submission of specimen other than nasopharyngeal swab, presence of  viral mutation(s) within the areas targeted by this assay, and inadequate number of viral copies(<138 copies/mL). A negative result must be combined with clinical observations, patient history, and epidemiological information. The expected result is Negative.  Fact Sheet for Patients:  EntrepreneurPulse.com.au  Fact Sheet for Healthcare Providers:  IncredibleEmployment.be  This test is no t yet approved or cleared by the [place] FDA and  has been authorized for detection and/or diagnosis of SARS-CoV-2 by FDA under an Emergency Use Authorization (EUA). This EUA will remain  in effect (meaning this test can be used) for the duration of the COVID-19 declaration under Section 564(b)(1) of the Act, 21 U.S.C.section 360bbb-3(b)(1), unless the authorization is terminated  or revoked sooner.       Influenza A by PCR NEGATIVE NEGATIVE   Influenza B by PCR NEGATIVE NEGATIVE    Comment: (NOTE) The Xpert Xpress SARS-CoV-2/FLU/RSV plus assay is intended as an aid in the diagnosis of influenza from Nasopharyngeal swab specimens and should not be used as a sole basis for treatment. Nasal washings and aspirates are unacceptable for Xpert Xpress SARS-CoV-2/FLU/RSV testing.  Fact Sheet for Patients: EntrepreneurPulse.com.au  Fact Sheet for Healthcare Providers: IncredibleEmployment.be  This test is not yet approved or cleared by the [place] FDA and has been authorized for detection and/or diagnosis of SARS-CoV-2 by FDA under an Emergency Use Authorization (EUA). This EUA will remain in effect (meaning this test can be used) for the duration of the COVID-19 declaration under Section 564(b)(1) of the Act, 21 U.S.C. section 360bbb-3(b)(1), unless the authorization is terminated or revoked.  Performed at [facility], [address], [place], [place] [address]   Lactic acid, plasma      Status: None   Collection Time: 05/06/21  9:20 PM  Result Value Ref Range   Lactic Acid, Venous 1.2 0.5 - 1.9 mmol/L    Comment: Performed at [facility], [place] [address] [place], [place] [address]  CBC     Status: Abnormal   Collection Time: 05/07/21  3:28 AM  Result Value Ref Range   WBC 10.4 4.0 - 10.5 K/uL   RBC 3.22 (L) 3.87 - 5.11 MIL/uL   Hemoglobin 7.8 (L) 12.0 - 15.0 g/dL   HCT 25.8 (L) 36.0 - 46.0 %   MCV 80.1 80.0 - 100.0 fL   MCH 24.2 (L) 26.0 - 34.0 pg   MCHC 30.2 30.0 - 36.0 g/dL   RDW 16.7 (H) 11.5 - 15.5 %   Platelets 615 (H) 150 - 400 K/uL   nRBC 0.0 0.0 - 0.2 %    Comment: Performed at [facility], [place] [address] [place], [place] [address]  Iron and TIBC     Status: Abnormal   Collection Time: 05/07/21  3:28 AM  Result Value Ref Range   Iron 27 (L) 28 - 170 ug/dL  TIBC 188 (L) 250 - 450 ug/dL   Saturation Ratios 14 10.4 - 31.8 %   UIBC 161 ug/dL    Comment: Performed at [facility], [place] [address] [place], [place] [address]  Hemoglobin and hematocrit, blood     Status: Abnormal   Collection Time: 05/07/21  8:41 AM  Result Value Ref Range   Hemoglobin 7.7 (L) 12.0 - 15.0 g/dL   HCT 25.6 (L) 36.0 - 46.0 %    Comment: Performed at [facility], [place] [address] [place], [place] [address]   CT ABDOMEN PELVIS W CONTRAST  Result Date: 05/06/2021 CLINICAL DATA:  Abscess. EXAM: CT ABDOMEN AND PELVIS WITH CONTRAST TECHNIQUE: Multidetector CT imaging of the abdomen and pelvis was performed using the standard protocol following bolus administration of intravenous contrast. RADIATION DOSE REDUCTION: This exam was performed according to the departmental dose-optimization program which includes automated exposure control, adjustment of the mA and/or kV according to patient size and/or use of iterative reconstruction technique. CONTRAST:  184mL OMNIPAQUE IOHEXOL 300 MG/ML  SOLN COMPARISON:  August 02, 2012. FINDINGS: Lower chest: No acute abnormality. Hepatobiliary: No focal liver abnormality is seen. No gallstones, gallbladder wall thickening, or biliary dilatation. Pancreas: Unremarkable. No pancreatic ductal dilatation or surrounding inflammatory changes. Spleen: Normal in size without focal abnormality. Adrenals/Urinary Tract: Adrenal glands are unremarkable. Kidneys are normal, without renal calculi, focal lesion, or hydronephrosis. Bladder is unremarkable. Stomach/Bowel: Stomach is within normal limits. Appendix appears normal. No evidence of bowel wall thickening, distention, or inflammatory changes. Vascular/Lymphatic: No significant vascular findings are present. No enlarged abdominal or pelvic lymph nodes. Reproductive: Uterus and bilateral adnexa are unremarkable. Other: No abdominal wall hernia or abnormality. No abdominopelvic ascites. Musculoskeletal: No acute or significant osseous findings. IMPRESSION: No acute abnormality seen in the abdomen or pelvis. Electronically Signed   By: Marijo Conception M.D.   On: 05/06/2021 12:54    Anti-infectives (From admission, onward)    Start     Dose/Rate Route Frequency Ordered Stop   05/07/21 0200  metroNIDAZOLE (FLAGYL) IVPB 500 mg        500 mg 100 mL/hr over 60 Minutes Intravenous Every 12 hours 05/07/21 0105     05/07/21 0000  vancomycin (VANCOREADY) IVPB 1500 mg/300 mL        1,500 mg 150 mL/hr over 120 Minutes Intravenous Every 12 hours 05/06/21 1123     05/06/21 2000  ceFEPIme (MAXIPIME) 2 g in sodium chloride 0.9 % 100 mL IVPB        2 g 200 mL/hr over 30 Minutes Intravenous Every 8 hours 05/06/21 1123     05/06/21 1230  vancomycin (VANCOCIN) IVPB 1000 mg/200 mL premix        1,000 mg 200 mL/hr over 60 Minutes Intravenous  Once 05/06/21 1103 05/06/21 1507   05/06/21 1115  ceFEPIme (MAXIPIME) 2 g in sodium chloride 0.9 % 100 mL IVPB        2 g 200 mL/hr over 30 Minutes Intravenous  Once 05/06/21 1103 05/06/21 1150   05/06/21 1115   vancomycin (VANCOCIN) IVPB 1000 mg/200 mL premix        1,000 mg 200 mL/hr over 60 Minutes Intravenous  Once 05/06/21 1103 05/06/21 1238        Assessment/Plan Hidradenitis Suppurativa  - CT 1/31 without abscesses - WBC 14.3 on admit and normalized today, afebrile, BP soft  - On exam patient has noted hidradenitis suppurativa with multiple chronic draining sinus tracts without  any obvious undrained abscesses. She is followed by [facility] for this and previously was on Humira.  Reviewed with my attending.  No indication for any current operative interventions.  We will sign off.  Recommend continuing antibiotics and following up with patient's dermatologist.   FEN: HH diet  VTE: SCDs, per [facility] ID: cefepime/flagyl/vanc Foley - Per [facility]  - Per [facility] -  Pre-syncope? Wolff-Parkinson-White syndrome Iron deficiency anemia  Morbid obesity - BMI 41.19 Gout Depression/Anxiety  This care required moderate level of medical decision making.   Alferd Apa, [facility] 05/07/2021, 12:03 PM Please see Amion for pager number during day hours 7:00am-4:30pm

## 2021-05-07 NOTE — Consult Note (Signed)
[facility]    Date of Admission:  05/06/2021     Reason for Consult: hiadrenitis flare    Referring Provider: Dwyane Dee    Abx: 1/31-c  Vanc/cefepime/flagyl      1/25-31 outpatient rifampin/moxi/flagyl   Assessment: Severe hiadrenitis with flare  Mild leukocytosis on admission resolved within 24hours Bcx ngtd. Do not suspect sepsis/bsi  Patient long standing disease failed previous abx courses. Recently started humira a few months prior to admission  Surgery had evaluated as well   This is mostly a non-ID related issue. Will defer management to dermatology/surgery. Unclear role of abx although certain regimens are often used for flare. Unless blood stream infection, often culture of sinus tract/wound do not correlate with response to abx     Plan: Can continue vanc/cefepime/flagyl for today. If tomorrow bcx negative can transition back to her dermatologist's desired abx regimen and duration HS management per dermatology/surgery Will follow up on blood cx and make abx changes as needed Discussed with primary team    I spent 60 minute reviewing data/chart, and coordinating care and >50% direct face to face time providing counseling/discussing diagnostics/treatment plan with patient   ------------------------------------------------ Principal Problem:   Sepsis due to cellulitis ([place]) Active Problems:   Hidradenitis suppurativa   Iron deficiency anemia   Wolff-Parkinson-White (WPW) syndrome   Morbid obesity ([place])   Abscess    HPI: Amanda Davenport is a 41 y.o. female with HS here for increased lesions in buttock area associated with pain   She has had this issue chronically. Most recently started humira a few months ago. She doesn't think it helps as she noted more lesion in buttock area  She normally has it in groin/axilla  She spoke with her dermatologist a week prior and was started on moxi/flagyl/rifampin  She has had  rifampin/doxy previously along with flagyl/quinolone combination  No fever chill Just lots of pain around lesions  On admission afebrile Wbc 14 but normalized within 24 hours Abd/pelv ct no abscess Surgery to see patient Started on vanc/cefepime/flagyl  Subjectively no change    Family History  Problem Relation Age of Onset   Breast cancer Mother    Pulmonary embolism Mother        died of PE   Colon cancer Mother    Irritable bowel syndrome Mother    Cancer Mother    Hypertension Father    Diabetes Paternal Grandmother    Heart disease Neg Hx    Stroke Neg Hx     Social History   Tobacco Use   Smoking status: Former    Years: 0.50    Types: Cigarettes, Cigars    Quit date: 04/30/2019    Years since quitting: 2.0   Smokeless tobacco: Never   Tobacco comments:    smokes black and milds - last use early-mid August  Vaping Use   Vaping Use: Never used  Substance Use Topics   Alcohol use: No   Drug use: Yes    Types: Marijuana    Comment: 2+ times per month    Allergies  Allergen Reactions   Other Other (See Comments)    All Antibiotics cause severe vaginal yeast infections   Penicillins Hives, Itching, Swelling and Rash    Has patient had a PCN reaction causing immediate rash, facial/tongue/throat swelling, SOB or lightheadedness with hypotension: Yes Has patient had a PCN reaction causing severe rash involving mucus membranes or skin necrosis: Yes  Has patient had a PCN reaction that required hospitalization Yes Has patient had a PCN reaction occurring within the last 10 years: Yes If all of the above answers are "NO", then may proceed with Cephalosporin use.  Has patient had a PCN reaction causing immediate rash, facial/tongue/throat swelling, SOB or lightheadedness with hypotension: Yes Has patient had a PCN reaction causing severe rash involving mucus membranes or skin necrosis: Yes Has patient had a PCN reaction that required hospitalization Yes Has  patient had a PCN reaction occurring within the last 10 years: Yes If all of the above answers are "NO", then may proceed with Cephalosporin use. Has patient had a PCN reaction causing immediate rash, facial/tongue/throat swelling, SOB or lightheadedness with hypotension: Yes Has patient had a PCN reaction causing severe rash involving mucus membranes or skin necrosis: Yes Has patient had a PCN reaction that required hospitalization Yes Has patient had a PCN reaction occurring within the last 10 years: Yes If all of the above answers are "NO", then may proceed with Cephalosporin use.   Shellfish-Derived Products Hives   Shrimp Extract Allergy Skin Test    Shrimp [Shellfish Allergy] Hives   Sulfa Antibiotics Rash    Review of Systems: ROS All Other ROS was negative, except mentioned above   Past Medical History:  Diagnosis Date   Allergy    Anemia    receives transfusions periodically   Anxiety    Arrhythmia    Chronic headache    Depression    Dysrhythmia    Gout 12/2018   Knee pain    Nearsightedness    wears glasses   Neuropathy    Obesity    Pneumonia    Pre-diabetes    Recurrent boils    WPW (Wolff-Parkinson-White syndrome)        Scheduled Meds:  Chlorhexidine Gluconate Cloth  6 each Topical Daily   metoprolol succinate  25 mg Oral Daily   nutrition supplement (JUVEN)  1 packet Oral BID BM   Ensure Max Protein  11 oz Oral Daily   Continuous Infusions:  sodium chloride     sodium chloride Stopped (05/06/21 1807)   ceFEPime (MAXIPIME) IV 2 g (05/07/21 1615)   metronidazole Stopped (05/07/21 1607)   vancomycin 1,500 mg (05/07/21 1222)   PRN Meds:.sodium chloride, sodium chloride, acetaminophen, hydrOXYzine, morphine injection   OBJECTIVE: Blood pressure (!) 100/53, pulse 86, temperature 98.3 F (36.8 C), temperature source Oral, resp. rate 20, height 5\' 10"  (1.778 m), weight 130.2 kg, last menstrual period 04/30/2021, SpO2 95 %.  Physical  Exam   General/constitutional: no distress, pleasant HEENT: Normocephalic, PER, Conj Clear, EOMI, Oropharynx clear Neck supple CV: rrr no mrg Lungs: clear to auscultation, normal respiratory effort Abd: Soft, Nontender Ext: no edema Skin: deferred at her request -- reviewed admission pictures Neuro: nonfocal MSK: no peripheral joint swelling/tenderness/warmth; back spines nontender  Lab Results Lab Results  Component Value Date   WBC 10.4 05/07/2021   HGB 7.7 (L) 05/07/2021   HCT 25.6 (L) 05/07/2021   MCV 80.1 05/07/2021   PLT 615 (H) 05/07/2021    Lab Results  Component Value Date   CREATININE 0.74 05/06/2021   BUN 7 05/06/2021   NA 135 05/06/2021   K 3.6 05/06/2021   CL 101 05/06/2021   CO2 22 05/06/2021    Lab Results  Component Value Date   ALT 5 05/06/2021   AST 15 05/06/2021   ALKPHOS 64 05/06/2021   BILITOT 0.3 05/06/2021  Microbiology: Recent Results (from the past 240 hour(s))  Blood culture (routine x 2)     Status: None (Preliminary result)   Collection Time: 05/06/21 10:07 AM   Specimen: Right Antecubital; Blood  Result Value Ref Range Status   Specimen Description   Final    RIGHT ANTECUBITAL Performed at [facility], [address] [place], [place] [address]    Special Requests   Final    BOTTLES DRAWN AEROBIC ONLY Blood Culture adequate volume Performed at [facility], [address], [place], [place] [address]    Culture   Final    NO GROWTH 1 DAY Performed at [facility], [place] [address] [place], [place] [address]    Report Status PENDING  Incomplete  Blood culture (routine x 2)     Status: None (Preliminary result)   Collection Time: 05/06/21 10:07 AM   Specimen: BLOOD LEFT HAND  Result Value Ref Range Status   Specimen Description   Final    BLOOD LEFT HAND Performed at [facility], [address], [place], [place] [address]    Special Requests   Final     BOTTLES DRAWN AEROBIC AND ANAEROBIC Blood Culture adequate volume Performed at [facility], [address], [place], [place] [address]    Culture   Final    NO GROWTH 1 DAY Performed at [facility], [place] [address] [place], [place] [address]    Report Status PENDING  Incomplete  Resp Panel by RT-PCR (Flu A&B, Covid) Nasopharyngeal Swab     Status: None   Collection Time: 05/06/21  2:30 PM   Specimen: Nasopharyngeal Swab; Nasopharyngeal(NP) swabs in vial transport medium  Result Value Ref Range Status   SARS Coronavirus 2 by RT PCR NEGATIVE NEGATIVE Final    Comment: (NOTE) SARS-CoV-2 target nucleic acids are NOT DETECTED.  The SARS-CoV-2 RNA is generally detectable in upper respiratory specimens during the acute phase of infection. The lowest concentration of SARS-CoV-2 viral copies this assay can detect is 138 copies/mL. A negative result does not preclude SARS-Cov-2 infection and should not be used as the sole basis for treatment or other patient management decisions. A negative result may occur with  improper specimen collection/handling, submission of specimen other than nasopharyngeal swab, presence of viral mutation(s) within the areas targeted by this assay, and inadequate number of viral copies(<138 copies/mL). A negative result must be combined with clinical observations, patient history, and epidemiological information. The expected result is Negative.  Fact Sheet for Patients:  EntrepreneurPulse.com.au  Fact Sheet for Healthcare Providers:  IncredibleEmployment.be  This test is no t yet approved or cleared by the [place] FDA and  has been authorized for detection and/or diagnosis of SARS-CoV-2 by FDA under an Emergency Use Authorization (EUA). This EUA will remain  in effect (meaning this test can be used) for the duration of the COVID-19 declaration under Section 564(b)(1) of the Act,  21 U.S.C.section 360bbb-3(b)(1), unless the authorization is terminated  or revoked sooner.       Influenza A by PCR NEGATIVE NEGATIVE Final   Influenza B by PCR NEGATIVE NEGATIVE Final    Comment: (NOTE) The Xpert Xpress SARS-CoV-2/FLU/RSV plus assay is intended as an aid in the diagnosis of influenza from Nasopharyngeal swab specimens and should not be used as a sole basis for treatment. Nasal washings and aspirates are unacceptable for Xpert Xpress SARS-CoV-2/FLU/RSV testing.  Fact Sheet for Patients: EntrepreneurPulse.com.au  Fact Sheet for Healthcare Providers: IncredibleEmployment.be  This test is not yet approved or cleared by the [place] and has been authorized for detection and/or diagnosis of SARS-CoV-2 by FDA under an Emergency Use Authorization (EUA). This EUA will remain in effect (meaning this test can be used) for the duration of the COVID-19 declaration under Section 564(b)(1) of the Act, 21 U.S.C. section 360bbb-3(b)(1), unless the authorization is terminated or revoked.  Performed at [facility], [address] [place], [place] [address]      Serology:    Imaging: If present, new imagings (plain films, ct scans, and mri) have been personally visualized and interpreted; radiology reports have been reviewed. Decision making incorporated into the Impression / Recommendations.  05/06/21 ct abd pelv with contrast No acute abnormality seen in the abdomen or pelvis.    Jabier Mutton, [facility] for Infectious [place] [phone] pager    05/07/2021, 5:31 PM

## 2021-05-07 NOTE — Consult Note (Signed)
[facility] Nurse Consult Note: Reason for Consult:Patient with 15 year history of hidradenitis suppurativa (HS). Seen by Dermatology in [facility] (Dr. Lonna Cobb, a specialist in [facility]) in the community for an acute flare in in inframammary area in September; placed on Humira with minimal improvement. She has also been seen by Plastic surgery in the past for a left axilla lesion resection.  Worsening and new lesions in the buttock, right axillae and subpannicular areas. See also photos provided to EHR under Media Tab. Wound type: autoimmune Pressure Injury POA: N/A Measurement:N/A   Wound bed: crater-like, red, serosanguinous residue Drainage (amount, consistency, odor) serosanguinous Periwound: indurated, sacr tissue Dressing procedure/placement/frequency: I have communicated with Dr. Dwyane Dee via [place] that more in-depth care of these wounds is outside the scope of [facility] Nursing practice. I communicated that I would implement conservative care guidance for Nursing that was consistent with the care provided by Dermatology in [facility], i.e., using silver hydrofiber as a topical dressing (rather than silver sulfadiazine).  Patient has been educated in using bleach baths at home by Dr. Lonna Cobb. Recommend continued follow up with that provider in the [facility] clinic at [facility].   Patent will be provided with a mattress replacement with low air loss feature and is using [employer] bed linens (antimicrobial, low friction coefficient (CoF) while in house.  [facility] nursing team will not follow, but will remain available to this patient, the nursing and medical teams.  Please re-consult if needed. Thanks, Maudie Flakes, MSN, RN, [place], Arther Abbott  Pager# [phone]

## 2021-05-07 NOTE — Progress Notes (Signed)
Pt has very many scattered wounds all over her buttocks, peri, and axilla areas. Pictures are in the chart, and wounds were measured to the best of my ability with charge nurse assistance. Wounds were covered with non adhesives and ABD pads.

## 2021-05-07 NOTE — TOC Initial Note (Signed)
Transition of Care [facility]) - Initial/Assessment Note    Patient Details  Name: Amanda Davenport MRN: [id number] Date of Birth: [birth year]  [facility] CM/SW Contact:    [name], RN Phone Number: 05/07/2021, 11:15 AM  Clinical Narrative: d/c home. Monitor.                  Expected Discharge Plan: Home/Self Care Barriers to Discharge: Continued Medical Work up   Patient Goals and CMS Choice Patient states their goals for this hospitalization and ongoing recovery are:: home CMS Medicare.gov Compare Post Acute Care list provided to:: Patient    Expected Discharge Plan and Services Expected Discharge Plan: Home/Self Care   Discharge Planning Services: CM Consult Post Acute Care Choice: NA Living arrangements for the past 2 months: Single Family Home                                      Prior Living Arrangements/Services Living arrangements for the past 2 months: Single Family Home Lives with:: Self Patient language and need for interpreter reviewed:: Yes Do you feel safe going back to the place where you live?: Yes      Need for Family Participation in Patient Care: Yes (Comment) Care giver support system in place?: Yes (comment)   Criminal Activity/Legal Involvement Pertinent to Current Situation/Hospitalization: No - Comment as needed  Activities of Daily Living Home Assistive Devices/Equipment: None ADL Screening (condition at time of admission) Patient's cognitive ability adequate to safely complete daily activities?: Yes Is the patient deaf or have difficulty hearing?: No Does the patient have difficulty seeing, even when wearing glasses/contacts?: No Does the patient have difficulty concentrating, remembering, or making decisions?: No Patient able to express need for assistance with ADLs?: Yes Does the patient have difficulty dressing or bathing?: No Independently performs ADLs?: Yes (appropriate for developmental age) Does the patient  have difficulty walking or climbing stairs?: No Weakness of Legs: Both Weakness of Arms/Hands: None  Permission Sought/Granted Permission sought to share information with : Case Manager Permission granted to share information with : Yes, Verbal Permission Granted  Share Information with NAME: Case Manager           Emotional Assessment Appearance:: Appears stated age            Admission diagnosis:  Hidradenitis suppurativa [L73.2] Abscess [L02.91] Sepsis due to cellulitis ([place]) [L03.90, A41.9] Sepsis, due to unspecified organism, unspecified whether acute organ dysfunction present [facility]) [A41.9] Patient Active Problem List   Diagnosis Date Noted   Abscess 05/07/2021   Sepsis due to cellulitis ([place]) 05/06/2021   Menorrhagia 12/28/2020   COVID-19 virus infection 04/27/2020   Gout 04/27/2020   Generalized abdominal pain 03/15/2019   Neuropathy 12/27/2018   Ovarian cyst 04/29/2018   Facial cellulitis    Otitis externa    Dental abscess 11/27/2016   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   Absolute anemia 05/21/2015   Neck strain 12/11/2014   Hydradenitis 12/11/2014   Dental caries 12/11/2014   Constipation 12/03/2014   Neck pain 12/03/2014   Myalgia and myositis 11/27/2014   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   Morbid obesity ([place]) 11/27/2014   Hematochezia 12/16/2012   Wolff-Parkinson-White (WPW) syndrome 03/01/2012   Iron deficiency anemia 02/16/2012   Tachycardia 01/28/2012   Hidradenitis suppurativa 08/18/2011   AXILLARY ABSCESS 12/30/2006   ANKLE PAIN 10/11/2006   PCP:  Cipriano Mile, NP Pharmacy:  [facility] [phone] - [place], [address] [place] [address] Phone: [phone] Fax: [phone]  [facility] [place], [address] [address] Phone:  [phone] Fax: [phone]     Social Determinants of Health (SDOH) Interventions    Readmission Risk Interventions No flowsheet data found.

## 2021-05-07 NOTE — Progress Notes (Signed)
PROGRESS NOTE    Amanda Davenport  CXK:[id number] DOB: [birth year] DOA: 05/06/2021  PCP: Cipriano Mile, NP    Brief Narrative: This 41 year old morbidly obese female with PMH significant for hidradenitis suppurativa previously on Humira, iron deficiency anemia requiring as needed IV iron infusions, Wolff-Parkinson-White syndrome presented in the ED with concerns about worsening hidradenitis suppurativa.  Patient was evaluated by dermatology for the flareup under her breast in September and was started on Humira.  However patient reports minimal improvement.  She began to notice new abscesses under breasts and axillary and gluteal folds.  Her dermatologist has advised to stop Humira and started her on Flagyl, Moxifloxacin and rifampicin earlier this week. She was hypotensive on arrival.  CT abdomen and pelvis was negative for any acute processes.  Patient was started on empiric vancomycin and cefepime.  Infectious disease and general surgery consulted.  Assessment & Plan:   Principal Problem:   Sepsis due to cellulitis [facility]) Active Problems:   Hidradenitis suppurativa   Iron deficiency anemia   Wolff-Parkinson-White (WPW) syndrome   Morbid obesity (HCC)   Abscess  Severe sepsis sec. to abscesses from stage II hidradenitis suppurativa: Patient presented with hypotension, tachypnea, tachycardia, leukocytosis.  Lactic acid 1.7. Patient had innumerable abscesses mostly in the right axillary region,  lesions in the bilateral breast fold,  groin and gluteal cleft. Continue empiric antibiotics ( vancomycin and cefepime and Flagyl for anaerobic coverage ). Infectious disease consulted to guide antibiotic regimen. General surgery consulted to see if incision and drainage may be needed. Adequate pain control with pain medications Continue IV hydration sepsis physiology improving.  Intermittent hypotension: Blood pressure has improved with IV hydration.  Wolff-Parkinson-White syndrome: Continue  metoprolol.  Iron deficiency anemia: Hb is down trended to 8.5 from prior 11.2. She has received iron infusion in the past. Transfuse if hemoglobin drops below 7.0  Morbid obesity: Encourage weight loss.   DVT prophylaxis: SCDs Code Status: Full code Family Communication: No family at bedside Disposition Plan:   Status is: Inpatient Remains inpatient appropriate because: Admitted for multiple abscesses in the axillary, gluteal and pannus.  Requiring IV antibiotics, ID and general surgery consulted.  Planned Discharge Destination: Home  Consultants:  Infectious diseases General surgery Wound care  Procedures: CT abdomen and pelvis  Antimicrobials:   Anti-infectives (From admission, onward)    Start     Dose/Rate Route Frequency Ordered Stop   05/07/21 0200  metroNIDAZOLE (FLAGYL) IVPB 500 mg        500 mg 100 mL/hr over 60 Minutes Intravenous Every 12 hours 05/07/21 0105     05/07/21 0000  vancomycin (VANCOREADY) IVPB 1500 mg/300 mL        1,500 mg 150 mL/hr over 120 Minutes Intravenous Every 12 hours 05/06/21 1123     05/06/21 2000  ceFEPIme (MAXIPIME) 2 g in sodium chloride 0.9 % 100 mL IVPB        2 g 200 mL/hr over 30 Minutes Intravenous Every 8 hours 05/06/21 1123     05/06/21 1230  vancomycin (VANCOCIN) IVPB 1000 mg/200 mL premix        1,000 mg 200 mL/hr over 60 Minutes Intravenous  Once 05/06/21 1103 05/06/21 1507   05/06/21 1115  ceFEPIme (MAXIPIME) 2 g in sodium chloride 0.9 % 100 mL IVPB        2 g 200 mL/hr over 30 Minutes Intravenous  Once 05/06/21 1103 05/06/21 1150   05/06/21 1115  vancomycin (VANCOCIN) IVPB 1000 mg/200 mL premix  1,000 mg 200 mL/hr over 60 Minutes Intravenous  Once 05/06/21 1103 05/06/21 1238        Subjective: Patient was seen and examined at bedside.  Overnight events noted.   Patient reports feeling better.  She still reports having a lot of pain in the gluteal fold.  Objective: Vitals:   05/07/21 0000 05/07/21 0201  05/07/21 0345 05/07/21 0748  BP: 114/65 (!) 95/55 103/69 (!) 100/53  Pulse: 95 96 98 86  Resp: 15  17 20   Temp: 98 F (36.7 C)  99.4 F (37.4 C) 98.3 F (36.8 C)  TempSrc: Oral  Oral Oral  SpO2: 100%  99% 95%  Weight:      Height:        Intake/Output Summary (Last 24 hours) at 05/07/2021 1228 Last data filed at 05/07/2021 1000 Gross per 24 hour  Intake 1580.49 ml  Output 400 ml  Net 1180.49 ml   Filed Weights   05/06/21 0947  Weight: 130.2 kg    Examination:  General exam: Appears comfortable, not in any acute distress. Respiratory system: Clear to auscultation bilaterally, respiratory effort normal, RR 15 Cardiovascular system: S1 & S2 heard, regular rate and rhythm, no murmur. Gastrointestinal system: Abdominal soft, nontender, nondistended, BS+ Central nervous system: Alert and oriented. No focal neurological deficits. Extremities: No edema, no cyanosis, no clubbing. Skin: Multiple draining purulent abscesses with ropelike tracts under breasts, groin, bilateral lower extremities and right axilla. Psychiatry: Judgement and insight appear normal. Mood & affect appropriate.     Data Reviewed: I have personally reviewed following labs and imaging studies  CBC: Recent Labs  Lab 05/06/21 1007 05/07/21 0328 05/07/21 0841  WBC 14.3* 10.4  --   NEUTROABS 9.9*  --   --   HGB 8.5* 7.8* 7.7*  HCT 28.0* 25.8* 25.6*  MCV 77.8* 80.1  --   PLT 749* 615*  --    Basic Metabolic Panel: Recent Labs  Lab 05/06/21 1007  NA 135  K 3.6  CL 101  CO2 22  GLUCOSE 121*  BUN 7  CREATININE 0.74  CALCIUM 9.0  MG 1.8  PHOS 2.4*   GFR: Estimated Creatinine Clearance: 137.5 mL/min (by C-G formula based on SCr of 0.74 mg/dL). Liver Function Tests: Recent Labs  Lab 05/06/21 1007  AST 15  ALT 5  ALKPHOS 64  BILITOT 0.3  PROT 9.2*  ALBUMIN 3.2*   No results for input(s): LIPASE, AMYLASE in the last 168 hours. No results for input(s): AMMONIA in the last 168  hours. Coagulation Profile: No results for input(s): INR, PROTIME in the last 168 hours. Cardiac Enzymes: No results for input(s): CKTOTAL, CKMB, CKMBINDEX, TROPONINI in the last 168 hours. BNP (last 3 results) No results for input(s): PROBNP in the last 8760 hours. HbA1C: No results for input(s): HGBA1C in the last 72 hours. CBG: No results for input(s): GLUCAP in the last 168 hours. Lipid Profile: No results for input(s): CHOL, HDL, LDLCALC, TRIG, CHOLHDL, LDLDIRECT in the last 72 hours. Thyroid Function Tests: No results for input(s): TSH, T4TOTAL, FREET4, T3FREE, THYROIDAB in the last 72 hours. Anemia Panel: Recent Labs    05/07/21 0328  TIBC 188*  IRON 27*   Sepsis Labs: Recent Labs  Lab 05/06/21 1007 05/06/21 2120  LATICACIDVEN 1.7 1.2    Recent Results (from the past 240 hour(s))  Blood culture (routine x 2)     Status: None (Preliminary result)   Collection Time: 05/06/21 10:07 AM   Specimen: Right Antecubital; Blood  Result Value Ref Range Status   Specimen Description   Final    RIGHT ANTECUBITAL Performed at [facility], [address], [place], [place] [address]    Special Requests   Final    BOTTLES DRAWN AEROBIC ONLY Blood Culture adequate volume Performed at [facility], [address], [place], [place] [address]    Culture   Final    NO GROWTH <12 HOURS Performed at [facility], [place] [address], [place], [place] [address]    Report Status PENDING  Incomplete  Blood culture (routine x 2)     Status: None (Preliminary result)   Collection Time: 05/06/21 10:07 AM   Specimen: BLOOD LEFT HAND  Result Value Ref Range Status   Specimen Description   Final    BLOOD LEFT HAND Performed at [facility], [address], [place], [place] [address]    Special Requests   Final    BOTTLES DRAWN AEROBIC AND ANAEROBIC Blood Culture adequate volume Performed at [facility],  [address], [place], [place] [address]    Culture   Final    NO GROWTH <12 HOURS Performed at [facility], [place] [address] [place], [place] [address]    Report Status PENDING  Incomplete  Resp Panel by RT-PCR (Flu A&B, Covid) Nasopharyngeal Swab     Status: None   Collection Time: 05/06/21  2:30 PM   Specimen: Nasopharyngeal Swab; Nasopharyngeal(NP) swabs in vial transport medium  Result Value Ref Range Status   SARS Coronavirus 2 by RT PCR NEGATIVE NEGATIVE Final    Comment: (NOTE) SARS-CoV-2 target nucleic acids are NOT DETECTED.  The SARS-CoV-2 RNA is generally detectable in upper respiratory specimens during the acute phase of infection. The lowest concentration of SARS-CoV-2 viral copies this assay can detect is 138 copies/mL. A negative result does not preclude SARS-Cov-2 infection and should not be used as the sole basis for treatment or other patient management decisions. A negative result may occur with  improper specimen collection/handling, submission of specimen other than nasopharyngeal swab, presence of viral mutation(s) within the areas targeted by this assay, and inadequate number of viral copies(<138 copies/mL). A negative result must be combined with clinical observations, patient history, and epidemiological information. The expected result is Negative.  Fact Sheet for Patients:  EntrepreneurPulse.com.au  Fact Sheet for Healthcare Providers:  IncredibleEmployment.be  This test is no t yet approved or cleared by the [place] FDA and  has been authorized for detection and/or diagnosis of SARS-CoV-2 by FDA under an Emergency Use Authorization (EUA). This EUA will remain  in effect (meaning this test can be used) for the duration of the COVID-19 declaration under Section 564(b)(1) of the Act, 21 U.S.C.section 360bbb-3(b)(1), unless the authorization is terminated  or revoked sooner.       Influenza A by  PCR NEGATIVE NEGATIVE Final   Influenza B by PCR NEGATIVE NEGATIVE Final    Comment: (NOTE) The Xpert Xpress SARS-CoV-2/FLU/RSV plus assay is intended as an aid in the diagnosis of influenza from Nasopharyngeal swab specimens and should not be used as a sole basis for treatment. Nasal washings and aspirates are unacceptable for Xpert Xpress SARS-CoV-2/FLU/RSV testing.  Fact Sheet for Patients: EntrepreneurPulse.com.au  Fact Sheet for Healthcare Providers: IncredibleEmployment.be  This test is not yet approved or cleared by the [place] FDA and has been authorized for detection and/or diagnosis of SARS-CoV-2 by FDA under an Emergency Use Authorization (EUA). This EUA will remain  in effect (meaning this test can be used) for the duration of the COVID-19 declaration under Section 564(b)(1) of the Act, 21 U.S.C. section 360bbb-3(b)(1), unless the authorization is terminated or revoked.  Performed at [facility], [address] [place], [place] [address]     Radiology Studies: CT ABDOMEN PELVIS W CONTRAST  Result Date: 05/06/2021 CLINICAL DATA:  Abscess. EXAM: CT ABDOMEN AND PELVIS WITH CONTRAST TECHNIQUE: Multidetector CT imaging of the abdomen and pelvis was performed using the standard protocol following bolus administration of intravenous contrast. RADIATION DOSE REDUCTION: This exam was performed according to the departmental dose-optimization program which includes automated exposure control, adjustment of the mA and/or kV according to patient size and/or use of iterative reconstruction technique. CONTRAST:  149mL OMNIPAQUE IOHEXOL 300 MG/ML  SOLN COMPARISON:  August 02, 2012. FINDINGS: Lower chest: No acute abnormality. Hepatobiliary: No focal liver abnormality is seen. No gallstones, gallbladder wall thickening, or biliary dilatation. Pancreas: Unremarkable. No pancreatic ductal dilatation or surrounding inflammatory  changes. Spleen: Normal in size without focal abnormality. Adrenals/Urinary Tract: Adrenal glands are unremarkable. Kidneys are normal, without renal calculi, focal lesion, or hydronephrosis. Bladder is unremarkable. Stomach/Bowel: Stomach is within normal limits. Appendix appears normal. No evidence of bowel wall thickening, distention, or inflammatory changes. Vascular/Lymphatic: No significant vascular findings are present. No enlarged abdominal or pelvic lymph nodes. Reproductive: Uterus and bilateral adnexa are unremarkable. Other: No abdominal wall hernia or abnormality. No abdominopelvic ascites. Musculoskeletal: No acute or significant osseous findings. IMPRESSION: No acute abnormality seen in the abdomen or pelvis. Electronically Signed   By: Marijo Conception M.D.   On: 05/06/2021 12:54     Scheduled Meds:  Chlorhexidine Gluconate Cloth  6 each Topical Daily   metoprolol succinate  25 mg Oral Daily   Continuous Infusions:  sodium chloride     sodium chloride Stopped (05/06/21 1807)   sodium chloride 75 mL/hr at 05/06/21 2057   ceFEPime (MAXIPIME) IV 2 g (05/07/21 0631)   metronidazole 500 mg (05/07/21 0519)   vancomycin 1,500 mg (05/07/21 1222)     LOS: 1 day    Time spent: 50 min    Luisa Louk, MD [facility]   If 7PM-7AM, please contact night-coverage

## 2021-05-07 NOTE — Progress Notes (Signed)
Pt's IV infiltrated and had to call IV team due to difficult stick. Due to the wait, pt IV antibiotics flagyl and maxipime had to be pushed back. Messaged pharmacy to correct the times.

## 2021-05-07 NOTE — Progress Notes (Signed)
Initial Nutrition Assessment  DOCUMENTATION CODES:   Obesity unspecified  INTERVENTION:  - will order Ensure Max once/day, each supplement provides 150 kcal and 30 grams of protein. - will 1 packet Juven BID, each packet provides 95 calories, 2.5 grams of protein (collagen), and 9.8 grams of carbohydrate (3 grams sugar); also contains 7 grams of L-arginine and L-glutamine, 300 mg vitamin C, 15 mg vitamin E, 1.2 mcg vitamin B-12, 9.5 mg zinc, 200 mg calcium, and 1.5 g  Calcium Beta-hydroxy-Beta-methylbutyrate to support wound healing. - weigh patient today.  - complete NFPE when feasible.    NUTRITION DIAGNOSIS:   Increased nutrient needs related to acute illness, wound healing as evidenced by estimated needs.  GOAL:   Patient will meet greater than or equal to 90% of their needs  MONITOR:   PO intake, Supplement acceptance, Labs, Weight trends  REASON FOR ASSESSMENT:   Malnutrition Screening Tool  ASSESSMENT:   41 year old female with medical history of hidradenitis suppurativa previously on Humira, iron deficiency anemia requiring as needed IV iron infusions, gout, anxiety, depression, pre-diabetes, and Wolff-Parkinson-White syndrome. Patient admitted due to concern for worsening hidradenitis suppurativa. Patient was evaluated by dermatology for the flareup under her breast in September and was started on Humira, but noted minimal improvement. She noticed new abscesses under breasts and axillary and gluteal folds. She was advised to stop Humira and started on Flagyl, Moxifloxacin and rifampicin earlier this week. She was hypotensive on arrival.  CT abdomen and pelvis was negative for any acute processes.  Patient was started on empiric vancomycin and cefepime.  Infectious disease and General Surgery consulted.  Unable to see patient in person at this time. Patient was able to eat 100% of breakfast today (580 kcal and 22 grams protein). Lunch tray was delivered but completion  percentage of the meal not yet documented--meal provided 762 kcal and 39 grams protein.   She has not been seen by a [place] RD at any time in the past.   Weight yesterday was 287 lb and weight on 08/27/20 was 294 lb. This indicates 7 lb weight loss (2.4% body weight). It does appear that weight from 12/20/20 may have been copied forward to yesterday.   WOC note from earlier today reviewed.   Per notes: - severe sepsis--improving - intermittent hypotension--improving with IV fluids - multiple abscesses in axillary, gluteal, and pannus regions   Labs reviewed; Phos: 2.4 mg/dl. Medications reviewed.  IVF; NS @ 75 ml/hr.     NUTRITION - FOCUSED PHYSICAL EXAM:  Unable to complete at this time.   Diet Order:   Diet Order             Diet Heart Room service appropriate? Yes; Fluid consistency: Thin  Diet effective now                   EDUCATION NEEDS:   No education needs have been identified at this time  Skin:  Skin Assessment: Skin Integrity Issues: Skin Integrity Issues:: Stage II, Other (Comment) Stage II: coccyx Other: non-pressure injuries to bilateral axilla (draining), scattered on vagina, scattered on bilateral buttocks  Last BM:  2/1 (type 2, large amount)  Height:   Ht Readings from Last 1 Encounters:  05/06/21 5\' 10"  (1.778 m)    Weight:   Wt Readings from Last 1 Encounters:  05/06/21 130.2 kg     BMI:  Body mass index is 41.19 kg/m.   Estimated Nutritional Needs:  Kcal:  2150-2400 kcal Protein:  110-125 grams Fluid:  >/= 2.3 L/day      Jarome Matin, MS, RD, LDN Inpatient Clinical Dietitian RD pager # available in [facility]  After hours/weekend pager # available in [facility]

## 2021-05-08 DIAGNOSIS — A419 Sepsis, unspecified organism: Secondary | ICD-10-CM | POA: Diagnosis not present

## 2021-05-08 DIAGNOSIS — L0390 Cellulitis, unspecified: Secondary | ICD-10-CM | POA: Diagnosis not present

## 2021-05-08 LAB — CBC
HCT: 25.4 % — ABNORMAL LOW (ref 36.0–46.0)
Hemoglobin: 7.7 g/dL — ABNORMAL LOW (ref 12.0–15.0)
MCH: 24.4 pg — ABNORMAL LOW (ref 26.0–34.0)
MCHC: 30.3 g/dL (ref 30.0–36.0)
MCV: 80.4 fL (ref 80.0–100.0)
Platelets: 576 10*3/uL — ABNORMAL HIGH (ref 150–400)
RBC: 3.16 MIL/uL — ABNORMAL LOW (ref 3.87–5.11)
RDW: 17.2 % — ABNORMAL HIGH (ref 11.5–15.5)
WBC: 11.3 10*3/uL — ABNORMAL HIGH (ref 4.0–10.5)
nRBC: 0 % (ref 0.0–0.2)

## 2021-05-08 LAB — BASIC METABOLIC PANEL
Anion gap: 6 (ref 5–15)
BUN: 11 mg/dL (ref 6–20)
CO2: 21 mmol/L — ABNORMAL LOW (ref 22–32)
Calcium: 8.4 mg/dL — ABNORMAL LOW (ref 8.9–10.3)
Chloride: 107 mmol/L (ref 98–111)
Creatinine, Ser: 0.64 mg/dL (ref 0.44–1.00)
GFR, Estimated: >60 mL/min (ref >60)
Glucose, Bld: 98 mg/dL (ref 70–99)
Potassium: 3.3 mmol/L — ABNORMAL LOW (ref 3.5–5.1)
Sodium: 134 mmol/L — ABNORMAL LOW (ref 135–145)

## 2021-05-08 LAB — PHOSPHORUS: Phosphorus: 4.5 mg/dL (ref 2.5–4.6)

## 2021-05-08 LAB — MAGNESIUM: Magnesium: 1.8 mg/dL (ref 1.7–2.4)

## 2021-05-08 MED ORDER — METRONIDAZOLE 500 MG PO TABS
500.0000 mg | ORAL_TABLET | Freq: Two times a day (BID) | ORAL | Status: DC
  Administered 2021-05-08 – 2021-05-14 (×13): 500 mg via ORAL
  Filled 2021-05-08 (×13): qty 1

## 2021-05-08 MED ORDER — POTASSIUM CHLORIDE 20 MEQ PO PACK
40.0000 meq | PACK | Freq: Once | ORAL | Status: AC
Start: 2021-05-08 — End: 2021-05-08
  Administered 2021-05-08: 40 meq via ORAL
  Filled 2021-05-08: qty 2

## 2021-05-08 MED ORDER — CIPROFLOXACIN HCL 500 MG PO TABS
500.0000 mg | ORAL_TABLET | Freq: Two times a day (BID) | ORAL | Status: DC
  Administered 2021-05-08 – 2021-05-14 (×13): 500 mg via ORAL
  Filled 2021-05-08 (×14): qty 1

## 2021-05-08 NOTE — Plan of Care (Signed)
°  Problem: Education: Goal: Knowledge of General Education information will improve Description: Including pain rating scale, medication(s)/side effects and non-pharmacologic comfort measures Outcome: Progressing   Problem: Health Behavior/Discharge Planning: Goal: Ability to manage health-related needs will improve Outcome: Progressing   Problem: Clinical Measurements: Goal: Ability to maintain clinical measurements within normal limits will improve Outcome: Progressing   Problem: Clinical Measurements: Goal: Ability to maintain clinical measurements within normal limits will improve Outcome: Progressing

## 2021-05-08 NOTE — Progress Notes (Signed)
Id brief note  Bcx negative   A/p HS flare On humira    -would stop vancomycin -ok to transition to oral abx -- would defer choice to her dermatologist, but at this time could continue ciprofloxacin/flagyl -need outpatient derm follow up -id will sign off.  -discussed with primary team

## 2021-05-08 NOTE — Progress Notes (Signed)
PROGRESS NOTE    Amanda Davenport  PJA:[id number] DOB: 02/19/1981 DOA: 05/06/2021  PCP: Cipriano Mile, NP   Brief Narrative: This 41 year old morbidly obese female with PMH significant for hidradenitis suppurativa previously on Humira, iron deficiency anemia requiring as needed IV iron infusions, Wolff-Parkinson-White syndrome presented in the ED with concerns about worsening hidradenitis suppurativa.  Patient was evaluated by dermatology for the flareup under her breast in September and was started on Humira.  However patient reports minimal improvement.  She began to notice new abscesses under breasts and axillary and gluteal folds.  Her dermatologist has advised to stop Humira and started her on Flagyl, Moxifloxacin and rifampicin earlier this week. She was hypotensive on arrival.  CT abdomen and pelvis was negative for any acute processes.  Patient was started on empiric vancomycin and cefepime.  Infectious disease and general surgery consulted.  Assessment & Plan:   Principal Problem:   Sepsis due to cellulitis [facility]) Active Problems:   Hidradenitis suppurativa   Iron deficiency anemia   Wolff-Parkinson-White (WPW) syndrome   Morbid obesity (HCC)   Abscess  Severe sepsis sec. to abscesses from stage II hidradenitis suppurativa: Patient presented with hypotension, tachypnea, tachycardia, leukocytosis.  Lactic acid 1.7. Patient had innumerable abscesses mostly in the right axillary region,  lesions under both breasts,   groin and gluteal cleft. Continue empiric antibiotics ( vancomycin ,  cefepime and Flagyl for anaerobic coverage ). Infectious disease consulted to guide antibiotic regimen, recommended to change to oral Abx if blood culture negative. General surgery consulted, there is no indication for incision and drainage. Adequate pain control with pain medications Continue IV hydration. Sepsis physiology resolved.  Intermittent hypotension: Blood pressure has improved with IV  hydration.  Wolff-Parkinson-White syndrome: Continue metoprolol.  Iron deficiency anemia: Hb is down trended to 8.5 from prior 11.2. She has received iron infusion in the past. Transfuse if hemoglobin drops below 7.0  Morbid obesity: Encourage weight loss.   DVT prophylaxis: SCDs Code Status: Full code Family Communication: No family at bedside Disposition Plan:   Status is: Inpatient Remains inpatient appropriate because:  Admitted for multiple abscesses in the axillary, gluteal and pannus.  Requiring IV antibiotics, ID and general surgery consulted.    Planned Discharge Destination: Home  Consultants:  Infectious diseases General surgery Wound care  Procedures: CT abdomen and pelvis  Antimicrobials:   Anti-infectives (From admission, onward)    Start     Dose/Rate Route Frequency Ordered Stop   05/07/21 0200  metroNIDAZOLE (FLAGYL) IVPB 500 mg        500 mg 100 mL/hr over 60 Minutes Intravenous Every 12 hours 05/07/21 0105     05/07/21 0000  vancomycin (VANCOREADY) IVPB 1500 mg/300 mL        1,500 mg 150 mL/hr over 120 Minutes Intravenous Every 12 hours 05/06/21 1123     05/06/21 2000  ceFEPIme (MAXIPIME) 2 g in sodium chloride 0.9 % 100 mL IVPB        2 g 200 mL/hr over 30 Minutes Intravenous Every 8 hours 05/06/21 1123     05/06/21 1230  vancomycin (VANCOCIN) IVPB 1000 mg/200 mL premix        1,000 mg 200 mL/hr over 60 Minutes Intravenous  Once 05/06/21 1103 05/06/21 1507   05/06/21 1115  ceFEPIme (MAXIPIME) 2 g in sodium chloride 0.9 % 100 mL IVPB        2 g 200 mL/hr over 30 Minutes Intravenous  Once 05/06/21 1103 05/06/21 1150   05/06/21  1115  vancomycin (VANCOCIN) IVPB 1000 mg/200 mL premix        1,000 mg 200 mL/hr over 60 Minutes Intravenous  Once 05/06/21 1103 05/06/21 1238        Subjective: Patient was seen and examined at bedside.  Overnight events noted.   She reports feeling better.  She has multiple lesions with draining tracts. She  states she been dealing with this for a long time.  Objective: Vitals:   05/07/21 1410 05/07/21 1847 05/07/21 2026 05/08/21 0801  BP:  115/75 (!) 93/59 109/75  Pulse:  88 91 92  Resp:  20 20 (!) 22  Temp:  98.2 F (36.8 C) 98.1 F (36.7 C) 98.7 F (37.1 C)  TempSrc:  Oral Oral Oral  SpO2:  100% 100% 99%  Weight: 130.2 kg     Height:        Intake/Output Summary (Last 24 hours) at 05/08/2021 1120 Last data filed at 05/08/2021 1113 Gross per 24 hour  Intake 1275.65 ml  Output 2850 ml  Net -1574.35 ml   Filed Weights   05/06/21 0947 05/07/21 1410  Weight: 130.2 kg 130.2 kg    Examination:  General exam: Appears comfortable, not in any acute distress. Respiratory system: Clear to auscultation bilaterally, respiratory effort normal, RR 14 Cardiovascular system: S1 & S2 heard, regular rate and rhythm, no murmur. Gastrointestinal system: Abdominal soft, nontender, nondistended, BS+ Central nervous system: Alert and oriented. No focal neurological deficits. Extremities: No edema, no cyanosis, no clubbing. Skin: Multiple draining purulent lesions with ropelike tracts under breasts, groin, bilateral lower extremities and right axilla. Psychiatry: Judgement and insight appear normal. Mood & affect appropriate.     Data Reviewed: I have personally reviewed following labs and imaging studies  CBC: Recent Labs  Lab 05/06/21 1007 05/07/21 0328 05/07/21 0841 05/08/21 0513  WBC 14.3* 10.4  --  11.3*  NEUTROABS 9.9*  --   --   --   HGB 8.5* 7.8* 7.7* 7.7*  HCT 28.0* 25.8* 25.6* 25.4*  MCV 77.8* 80.1  --  80.4  PLT 749* 615*  --  742*   Basic Metabolic Panel: Recent Labs  Lab 05/06/21 1007 05/08/21 0513  NA 135 134*  K 3.6 3.3*  CL 101 107  CO2 22 21*  GLUCOSE 121* 98  BUN 7 11  CREATININE 0.74 0.64  CALCIUM 9.0 8.4*  MG 1.8 1.8  PHOS 2.4* 4.5   GFR: Estimated Creatinine Clearance: 137.5 mL/min (by C-G formula based on SCr of 0.64 mg/dL). Liver Function  Tests: Recent Labs  Lab 05/06/21 1007  AST 15  ALT 5  ALKPHOS 64  BILITOT 0.3  PROT 9.2*  ALBUMIN 3.2*   No results for input(s): LIPASE, AMYLASE in the last 168 hours. No results for input(s): AMMONIA in the last 168 hours. Coagulation Profile: No results for input(s): INR, PROTIME in the last 168 hours. Cardiac Enzymes: No results for input(s): CKTOTAL, CKMB, CKMBINDEX, TROPONINI in the last 168 hours. BNP (last 3 results) No results for input(s): PROBNP in the last 8760 hours. HbA1C: No results for input(s): HGBA1C in the last 72 hours. CBG: No results for input(s): GLUCAP in the last 168 hours. Lipid Profile: No results for input(s): CHOL, HDL, LDLCALC, TRIG, CHOLHDL, LDLDIRECT in the last 72 hours. Thyroid Function Tests: No results for input(s): TSH, T4TOTAL, FREET4, T3FREE, THYROIDAB in the last 72 hours. Anemia Panel: Recent Labs    05/07/21 0328  TIBC 188*  IRON 27*   Sepsis Labs:  Recent Labs  Lab 05/06/21 1007 05/06/21 2120  LATICACIDVEN 1.7 1.2    Recent Results (from the past 240 hour(s))  Blood culture (routine x 2)     Status: None (Preliminary result)   Collection Time: 05/06/21 10:07 AM   Specimen: Right Antecubital; Blood  Result Value Ref Range Status   Specimen Description   Final    RIGHT ANTECUBITAL Performed at [facility], [address], [place], [place] [address]    Special Requests   Final    BOTTLES DRAWN AEROBIC ONLY Blood Culture adequate volume Performed at [facility], [address], [place], [place] [address]    Culture   Final    NO GROWTH 2 DAYS Performed at [facility], [place] [address], [place], [place] [address]    Report Status PENDING  Incomplete  Blood culture (routine x 2)     Status: None (Preliminary result)   Collection Time: 05/06/21 10:07 AM   Specimen: BLOOD LEFT HAND  Result Value Ref Range Status   Specimen Description   Final    BLOOD LEFT HAND Performed  at [facility], [address] [place], [place] [address]    Special Requests   Final    BOTTLES DRAWN AEROBIC AND ANAEROBIC Blood Culture adequate volume Performed at [facility], [address] [place], [place] [address]    Culture   Final    NO GROWTH 2 DAYS Performed at [facility], [place] [address], [place],  [address]    Report Status PENDING  Incomplete  Resp Panel by RT-PCR (Flu A&B, Covid) Nasopharyngeal Swab     Status: None   Collection Time: 05/06/21  2:30 PM   Specimen: Nasopharyngeal Swab; Nasopharyngeal(NP) swabs in vial transport medium  Result Value Ref Range Status   SARS Coronavirus 2 by RT PCR NEGATIVE NEGATIVE Final    Comment: (NOTE) SARS-CoV-2 target nucleic acids are NOT DETECTED.  The SARS-CoV-2 RNA is generally detectable in upper respiratory specimens during the acute phase of infection. The lowest concentration of SARS-CoV-2 viral copies this assay can detect is 138 copies/mL. A negative result does not preclude SARS-Cov-2 infection and should not be used as the sole basis for treatment or other patient management decisions. A negative result may occur with  improper specimen collection/handling, submission of specimen other than nasopharyngeal swab, presence of viral mutation(s) within the areas targeted by this assay, and inadequate number of viral copies(<138 copies/mL). A negative result must be combined with clinical observations, patient history, and epidemiological information. The expected result is Negative.  Fact Sheet for Patients:  EntrepreneurPulse.com.au  Fact Sheet for Healthcare Providers:  IncredibleEmployment.be  This test is no t yet approved or cleared by the [place] FDA and  has been authorized for detection and/or diagnosis of SARS-CoV-2 by FDA under an Emergency Use Authorization (EUA). This EUA will remain  in effect (meaning  this test can be used) for the duration of the COVID-19 declaration under Section 564(b)(1) of the Act, 21 U.S.C.section 360bbb-3(b)(1), unless the authorization is terminated  or revoked sooner.       Influenza A by PCR NEGATIVE NEGATIVE Final   Influenza B by PCR NEGATIVE NEGATIVE Final    Comment: (NOTE) The Xpert Xpress SARS-CoV-2/FLU/RSV plus assay is intended as an aid in the diagnosis of influenza from Nasopharyngeal swab specimens and should not be used as a sole basis for treatment. Nasal washings and aspirates are unacceptable for Xpert Xpress SARS-CoV-2/FLU/RSV  testing.  Fact Sheet for Patients: EntrepreneurPulse.com.au  Fact Sheet for Healthcare Providers: IncredibleEmployment.be  This test is not yet approved or cleared by the [place] FDA and has been authorized for detection and/or diagnosis of SARS-CoV-2 by FDA under an Emergency Use Authorization (EUA). This EUA will remain in effect (meaning this test can be used) for the duration of the COVID-19 declaration under Section 564(b)(1) of the Act, 21 U.S.C. section 360bbb-3(b)(1), unless the authorization is terminated or revoked.  Performed at [facility], [address], [place], [place] [address]     Radiology Studies: CT ABDOMEN PELVIS W CONTRAST  Result Date: 05/06/2021 CLINICAL DATA:  Abscess. EXAM: CT ABDOMEN AND PELVIS WITH CONTRAST TECHNIQUE: Multidetector CT imaging of the abdomen and pelvis was performed using the standard protocol following bolus administration of intravenous contrast. RADIATION DOSE REDUCTION: This exam was performed according to the departmental dose-optimization program which includes automated exposure control, adjustment of the mA and/or kV according to patient size and/or use of iterative reconstruction technique. CONTRAST:  14mL OMNIPAQUE IOHEXOL 300 MG/ML  SOLN COMPARISON:  August 02, 2012. FINDINGS: Lower chest: No  acute abnormality. Hepatobiliary: No focal liver abnormality is seen. No gallstones, gallbladder wall thickening, or biliary dilatation. Pancreas: Unremarkable. No pancreatic ductal dilatation or surrounding inflammatory changes. Spleen: Normal in size without focal abnormality. Adrenals/Urinary Tract: Adrenal glands are unremarkable. Kidneys are normal, without renal calculi, focal lesion, or hydronephrosis. Bladder is unremarkable. Stomach/Bowel: Stomach is within normal limits. Appendix appears normal. No evidence of bowel wall thickening, distention, or inflammatory changes. Vascular/Lymphatic: No significant vascular findings are present. No enlarged abdominal or pelvic lymph nodes. Reproductive: Uterus and bilateral adnexa are unremarkable. Other: No abdominal wall hernia or abnormality. No abdominopelvic ascites. Musculoskeletal: No acute or significant osseous findings. IMPRESSION: No acute abnormality seen in the abdomen or pelvis. Electronically Signed   By: Marijo Conception M.D.   On: 05/06/2021 12:54     Scheduled Meds:  Chlorhexidine Gluconate Cloth  6 each Topical Daily   metoprolol succinate  25 mg Oral Daily   nutrition supplement (JUVEN)  1 packet Oral BID BM   potassium chloride  40 mEq Oral Once   Ensure Max Protein  11 oz Oral Daily   Continuous Infusions:  sodium chloride     sodium chloride Stopped (05/06/21 1807)   ceFEPime (MAXIPIME) IV 2 g (05/08/21 3007)   metronidazole 500 mg (05/08/21 0414)   vancomycin Stopped (05/08/21 0227)     LOS: 2 days    Time spent: 35 min    Lasean Gorniak, MD [facility]   If 7PM-7AM, please contact night-coverage

## 2021-05-08 NOTE — TOC Progression Note (Addendum)
Transition of Care [facility]) - Progression Note    Patient Details  Name: Amanda Davenport MRN: [id number] Date of Birth: 10-31-1980  [facility] CM/SW Contact  Romanita Fager, Juliann Pulse, RN Phone Number: 05/08/2021, 3:49 PM  Clinical Narrative:  Spoke to patient about d/c plans-in agreement to [facility] for instruction of wound care-patient will be primary care giver-await documentation of ability of patient to do wound care.[facility] rep Jason following for [facility] if ordered-Start of care-next Monday 2//6/23.     Expected Discharge Plan: [place] Barriers to Discharge: Continued Medical Work up  Expected Discharge Plan and Services Expected Discharge Plan: [place]   Discharge Planning Services: CM Consult Post Acute Care Choice: NA Living arrangements for the past 2 months: Single Family Home                                       Social Determinants of Health (SDOH) Interventions    Readmission Risk Interventions No flowsheet data found.

## 2021-05-09 DIAGNOSIS — A419 Sepsis, unspecified organism: Secondary | ICD-10-CM | POA: Diagnosis not present

## 2021-05-09 DIAGNOSIS — L0390 Cellulitis, unspecified: Secondary | ICD-10-CM | POA: Diagnosis not present

## 2021-05-09 LAB — CBC
HCT: 28.1 % — ABNORMAL LOW (ref 36.0–46.0)
Hemoglobin: 8.4 g/dL — ABNORMAL LOW (ref 12.0–15.0)
MCH: 24.2 pg — ABNORMAL LOW (ref 26.0–34.0)
MCHC: 29.9 g/dL — ABNORMAL LOW (ref 30.0–36.0)
MCV: 81 fL (ref 80.0–100.0)
Platelets: 651 10*3/uL — ABNORMAL HIGH (ref 150–400)
RBC: 3.47 MIL/uL — ABNORMAL LOW (ref 3.87–5.11)
RDW: 17.6 % — ABNORMAL HIGH (ref 11.5–15.5)
WBC: 13.6 10*3/uL — ABNORMAL HIGH (ref 4.0–10.5)
nRBC: 0 % (ref 0.0–0.2)

## 2021-05-09 LAB — BASIC METABOLIC PANEL
Anion gap: 7 (ref 5–15)
BUN: 14 mg/dL (ref 6–20)
CO2: 22 mmol/L (ref 22–32)
Calcium: 8.4 mg/dL — ABNORMAL LOW (ref 8.9–10.3)
Chloride: 104 mmol/L (ref 98–111)
Creatinine, Ser: 0.66 mg/dL (ref 0.44–1.00)
GFR, Estimated: >60 mL/min (ref >60)
Glucose, Bld: 96 mg/dL (ref 70–99)
Potassium: 3.6 mmol/L (ref 3.5–5.1)
Sodium: 133 mmol/L — ABNORMAL LOW (ref 135–145)

## 2021-05-09 LAB — D-DIMER, QUANTITATIVE: D-Dimer, Quant: 1.58 ug/mL-FEU — ABNORMAL HIGH (ref 0.00–0.50)

## 2021-05-09 NOTE — Progress Notes (Signed)
Patient called out and said she had sudden onset of shortness of breath, palpitation and increased heart rate. Denied chest pain. EKG obtained. MD paged to notify. Awaiting response.

## 2021-05-09 NOTE — Progress Notes (Signed)
PROGRESS NOTE    [name]  RSW:[id number] DOB: 1980-06-01 DOA: 05/06/2021  PCP: Cipriano Mile, NP   Brief Narrative: This 41 year old morbidly obese female with PMH significant for hidradenitis suppurativa previously on Humira, iron deficiency anemia requiring as needed IV iron infusions, Wolff-Parkinson-White syndrome presented in the ED with concerns about worsening hidradenitis suppurativa.  Patient was evaluated by dermatology for the flareup under her breast in September and was started on Humira.  However patient reports minimal improvement.  She began to notice new abscesses under breasts and axillary and gluteal folds.  Her dermatologist has advised to stop Humira and started her on Flagyl, Moxifloxacin and rifampicin earlier this week. She was hypotensive on arrival.  CT abdomen and pelvis was negative for any acute processes.  Patient was started on empiric vancomycin and cefepime.  Infectious disease and general surgery consulted.  Assessment & Plan:   Principal Problem:   Sepsis due to cellulitis [facility]) Active Problems:   Hidradenitis suppurativa   Iron deficiency anemia   Wolff-Parkinson-White (WPW) syndrome   Morbid obesity (HCC)   Abscess  Severe sepsis sec. to abscesses from stage II hidradenitis suppurativa: Patient presented with hypotension, tachypnea, tachycardia, leukocytosis.  Lactic acid 1.7. Patient had innumerable abscesses mostly in the right axillary region,  lesions under both breasts,   groin and gluteal cleft. Continue empiric antibiotics ( vancomycin ,  cefepime and Flagyl for anaerobic coverage ). Infectious disease consulted to guide antibiotic regimen, recommended to change to oral Abx if blood culture negative. General surgery consulted, there is no indication for incision and drainage. Adequate pain control with pain medications Continue IV hydration. Sepsis physiology resolved. Antibiotic changed to ciprofloxacin and Flagyl.  Patient with  follow-up with dermatologist outpatient.  Intermittent hypotension: Blood pressure has improved with IV hydration.  Wolff-Parkinson-White syndrome: Continue metoprolol.  Iron deficiency anemia: Hb is down trended to 8.5 from prior 11.2. She has received iron infusion in the past. Transfuse if hemoglobin drops below 7.0  Morbid obesity: Encourage weight loss. PT and OT eval.   DVT prophylaxis: SCDs Code Status: Full code Family Communication: No family at bedside Disposition Plan:   Status is: Inpatient Remains inpatient appropriate because:  Admitted for multiple abscesses in the axillary, gluteal and pannus.  Requiring IV antibiotics, ID and general surgery consulted. PT and OT eval for mobility issues.   Planned Discharge Destination: Home  Consultants:  Infectious diseases General surgery Wound care  Procedures: CT abdomen and pelvis  Antimicrobials:   Anti-infectives (From admission, onward)    Start     Dose/Rate Route Frequency Ordered Stop   05/08/21 1330  ciprofloxacin (CIPRO) tablet 500 mg        500 mg Oral 2 times daily 05/08/21 1231     05/08/21 1330  metroNIDAZOLE (FLAGYL) tablet 500 mg        500 mg Oral Every 12 hours 05/08/21 1231     05/07/21 0200  metroNIDAZOLE (FLAGYL) IVPB 500 mg  Status:  Discontinued        500 mg 100 mL/hr over 60 Minutes Intravenous Every 12 hours 05/07/21 0105 05/08/21 1231   05/07/21 0000  vancomycin (VANCOREADY) IVPB 1500 mg/300 mL  Status:  Discontinued        1,500 mg 150 mL/hr over 120 Minutes Intravenous Every 12 hours 05/06/21 1123 05/08/21 1231   05/06/21 2000  ceFEPIme (MAXIPIME) 2 g in sodium chloride 0.9 % 100 mL IVPB  Status:  Discontinued  2 g 200 mL/hr over 30 Minutes Intravenous Every 8 hours 05/06/21 1123 05/08/21 1231   05/06/21 1230  vancomycin (VANCOCIN) IVPB 1000 mg/200 mL premix        1,000 mg 200 mL/hr over 60 Minutes Intravenous  Once 05/06/21 1103 05/06/21 1507   05/06/21 1115  ceFEPIme  (MAXIPIME) 2 g in sodium chloride 0.9 % 100 mL IVPB        2 g 200 mL/hr over 30 Minutes Intravenous  Once 05/06/21 1103 05/06/21 1150   05/06/21 1115  vancomycin (VANCOCIN) IVPB 1000 mg/200 mL premix        1,000 mg 200 mL/hr over 60 Minutes Intravenous  Once 05/06/21 1103 05/06/21 1238        Subjective: Patient was seen and examined at bedside.  Overnight events noted.   Patient reports feeling better.  Blood pressure has improved. She still appears weak,  states she is not able to stand by self.  She has multiple lesions with draining tracts.   Objective: Vitals:   05/08/21 2028 05/09/21 0502 05/09/21 0950 05/09/21 1140  BP: 99/62 100/66 96/72 98/62   Pulse: 98 94 90 100  Resp: 18 18  18   Temp: 98.8 F (37.1 C) 98.4 F (36.9 C)  98.8 F (37.1 C)  TempSrc: Oral Oral  Oral  SpO2: 100% 97%  100%  Weight:      Height:        Intake/Output Summary (Last 24 hours) at 05/09/2021 1224 Last data filed at 05/09/2021 0507 Gross per 24 hour  Intake 940 ml  Output 2500 ml  Net -1560 ml   Filed Weights   05/06/21 0947 05/07/21 1410  Weight: 130.2 kg 130.2 kg    Examination:  General exam: Appears comfortable, deconditioned, not in any distress. Respiratory system: Clear to auscultation bilaterally, respiratory effort normal, RR 12 Cardiovascular system: S1 & S2 heard, regular rate and rhythm, no murmur. Gastrointestinal system: Abdominal soft, nontender, nondistended, BS+ Central nervous system: Alert and oriented x 3 . No focal neurological deficits. Extremities: No edema, no cyanosis, no clubbing. Skin: Multiple erythematous lesions with ropelike tracts under breasts, groin, bilateral lower extremities and right axilla. Psychiatry: Judgement and insight appear normal. Mood & affect appropriate.     Data Reviewed: I have personally reviewed following labs and imaging studies  CBC: Recent Labs  Lab 05/06/21 1007 05/07/21 0328 05/07/21 0841 05/08/21 0513  05/09/21 0431  WBC 14.3* 10.4  --  11.3* 13.6*  NEUTROABS 9.9*  --   --   --   --   HGB 8.5* 7.8* 7.7* 7.7* 8.4*  HCT 28.0* 25.8* 25.6* 25.4* 28.1*  MCV 77.8* 80.1  --  80.4 81.0  PLT 749* 615*  --  576* 829*   Basic Metabolic Panel: Recent Labs  Lab 05/06/21 1007 05/08/21 0513 05/09/21 0431  NA 135 134* 133*  K 3.6 3.3* 3.6  CL 101 107 104  CO2 22 21* 22  GLUCOSE 121* 98 96  BUN 7 11 14   CREATININE 0.74 0.64 0.66  CALCIUM 9.0 8.4* 8.4*  MG 1.8 1.8  --   PHOS 2.4* 4.5  --    GFR: Estimated Creatinine Clearance: 137.5 mL/min (by C-G formula based on SCr of 0.66 mg/dL). Liver Function Tests: Recent Labs  Lab 05/06/21 1007  AST 15  ALT 5  ALKPHOS 64  BILITOT 0.3  PROT 9.2*  ALBUMIN 3.2*   No results for input(s): LIPASE, AMYLASE in the last 168 hours. No results for input(s):  AMMONIA in the last 168 hours. Coagulation Profile: No results for input(s): INR, PROTIME in the last 168 hours. Cardiac Enzymes: No results for input(s): CKTOTAL, CKMB, CKMBINDEX, TROPONINI in the last 168 hours. BNP (last 3 results) No results for input(s): PROBNP in the last 8760 hours. HbA1C: No results for input(s): HGBA1C in the last 72 hours. CBG: No results for input(s): GLUCAP in the last 168 hours. Lipid Profile: No results for input(s): CHOL, HDL, LDLCALC, TRIG, CHOLHDL, LDLDIRECT in the last 72 hours. Thyroid Function Tests: No results for input(s): TSH, T4TOTAL, FREET4, T3FREE, THYROIDAB in the last 72 hours. Anemia Panel: Recent Labs    05/07/21 0328  TIBC 188*  IRON 27*   Sepsis Labs: Recent Labs  Lab 05/06/21 1007 05/06/21 2120  LATICACIDVEN 1.7 1.2    Recent Results (from the past 240 hour(s))  Blood culture (routine x 2)     Status: None (Preliminary result)   Collection Time: 05/06/21 10:07 AM   Specimen: Right Antecubital; Blood  Result Value Ref Range Status   Specimen Description   Final    RIGHT ANTECUBITAL Performed at [facility], [address], [place], [place] [address]    Special Requests   Final    BOTTLES DRAWN AEROBIC ONLY Blood Culture adequate volume Performed at [facility], [address] [place], [place] [address]    Culture   Final    NO GROWTH 3 DAYS Performed at [facility], [place] [address] [place], [place] [address]    Report Status PENDING  Incomplete  Blood culture (routine x 2)     Status: None (Preliminary result)   Collection Time: 05/06/21 10:07 AM   Specimen: BLOOD LEFT HAND  Result Value Ref Range Status   Specimen Description   Final    BLOOD LEFT HAND Performed at [facility], [address] [place], [place] [address]    Special Requests   Final    BOTTLES DRAWN AEROBIC AND ANAEROBIC Blood Culture adequate volume Performed at [facility], [address] [place], [place] [address]    Culture   Final    NO GROWTH 3 DAYS Performed at [facility], [place] [address] [place], [place] [address]    Report Status PENDING  Incomplete  Resp Panel by RT-PCR (Flu A&B, Covid) Nasopharyngeal Swab     Status: None   Collection Time: 05/06/21  2:30 PM   Specimen: Nasopharyngeal Swab; Nasopharyngeal(NP) swabs in vial transport medium  Result Value Ref Range Status   SARS Coronavirus 2 by RT PCR NEGATIVE NEGATIVE Final    Comment: (NOTE) SARS-CoV-2 target nucleic acids are NOT DETECTED.  The SARS-CoV-2 RNA is generally detectable in upper respiratory specimens during the acute phase of infection. The lowest concentration of SARS-CoV-2 viral copies this assay can detect is 138 copies/mL. A negative result does not preclude SARS-Cov-2 infection and should not be used as the sole basis for treatment or other patient management decisions. A negative result may occur with  improper specimen collection/handling, submission of specimen other than nasopharyngeal swab, presence of viral mutation(s) within  the areas targeted by this assay, and inadequate number of viral copies(<138 copies/mL). A negative result must be combined with clinical observations, patient history, and epidemiological information. The expected result is Negative.  Fact Sheet for Patients:  EntrepreneurPulse.com.au  Fact Sheet for Healthcare Providers:  IncredibleEmployment.be  This test is no t yet approved or cleared by the [place] and  has been authorized for detection and/or diagnosis of SARS-CoV-2 by FDA under an Emergency Use Authorization (EUA). This EUA will remain  in effect (meaning this test can be used) for the duration of the COVID-19 declaration under Section 564(b)(1) of the Act, 21 U.S.C.section 360bbb-3(b)(1), unless the authorization is terminated  or revoked sooner.       Influenza A by PCR NEGATIVE NEGATIVE Final   Influenza B by PCR NEGATIVE NEGATIVE Final    Comment: (NOTE) The Xpert Xpress SARS-CoV-2/FLU/RSV plus assay is intended as an aid in the diagnosis of influenza from Nasopharyngeal swab specimens and should not be used as a sole basis for treatment. Nasal washings and aspirates are unacceptable for Xpert Xpress SARS-CoV-2/FLU/RSV testing.  Fact Sheet for Patients: EntrepreneurPulse.com.au  Fact Sheet for Healthcare Providers: IncredibleEmployment.be  This test is not yet approved or cleared by the [place] FDA and has been authorized for detection and/or diagnosis of SARS-CoV-2 by FDA under an Emergency Use Authorization (EUA). This EUA will remain in effect (meaning this test can be used) for the duration of the COVID-19 declaration under Section 564(b)(1) of the Act, 21 U.S.C. section 360bbb-3(b)(1), unless the authorization is terminated or revoked.  Performed at [facility], [address], [place], [place] [address]     Radiology Studies: No results  found.   Scheduled Meds:  Chlorhexidine Gluconate Cloth  6 each Topical Daily   ciprofloxacin  500 mg Oral BID   metoprolol succinate  25 mg Oral Daily   metroNIDAZOLE  500 mg Oral Q12H   nutrition supplement (JUVEN)  1 packet Oral BID BM   Ensure Max Protein  11 oz Oral Daily   Continuous Infusions:  sodium chloride     sodium chloride Stopped (05/06/21 1807)     LOS: 3 days    Time spent: 35 min    Nikkita Adeyemi, MD [facility]   If 7PM-7AM, please contact night-coverage

## 2021-05-09 NOTE — Progress Notes (Signed)
PT Cancellation Note  Patient Details Name: Amanda Davenport MRN: [id number] DOB: [birth year]   Cancelled Treatment:    Reason Eval/Treat Not Completed: Other (comment) (morphine). Pt reports recently received morphine and is now too groggy to mobilize, requests therapist check back tomorrow. Pt reports she has been up to the [facility] and recliner with nursing today.     Talbot Grumbling PT, DPT 05/09/21, 3:13 PM

## 2021-05-10 ENCOUNTER — Inpatient Hospital Stay (HOSPITAL_COMMUNITY): Payer: Medicare (Managed Care)

## 2021-05-10 ENCOUNTER — Encounter: Payer: Self-pay | Admitting: Hematology

## 2021-05-10 DIAGNOSIS — A419 Sepsis, unspecified organism: Secondary | ICD-10-CM | POA: Diagnosis not present

## 2021-05-10 DIAGNOSIS — L0390 Cellulitis, unspecified: Secondary | ICD-10-CM | POA: Diagnosis not present

## 2021-05-10 LAB — HEMOGLOBIN AND HEMATOCRIT, BLOOD
HCT: 29 % — ABNORMAL LOW (ref 36.0–46.0)
Hemoglobin: 8.8 g/dL — ABNORMAL LOW (ref 12.0–15.0)

## 2021-05-10 MED ORDER — SODIUM CHLORIDE 0.9 % IV SOLN: INTRAVENOUS | Status: DC

## 2021-05-10 MED ORDER — IOHEXOL 350 MG/ML SOLN
80.0000 mL | Freq: Once | INTRAVENOUS | Status: AC | PRN
  Administered 2021-05-10: 80 mL via INTRAVENOUS

## 2021-05-10 MED ORDER — SODIUM CHLORIDE 0.9 % IV BOLUS
500.0000 mL | Freq: Once | INTRAVENOUS | Status: AC
  Administered 2021-05-10: 500 mL via INTRAVENOUS

## 2021-05-10 NOTE — Evaluation (Signed)
Physical Therapy Evaluation Patient Details Name: Amanda Davenport MRN: [id number] DOB: 1981-03-02 Today's Date: 05/10/2021  History of Present Illness  This 41 year old morbidly obese female with PMH significant for hidradenitis suppurativa previously on Humira, iron deficiency anemia requiring as needed IV iron infusions, Wolff-Parkinson-White syndrome presented in the ED 05/06/21 with concerns about worsening hidradenitis suppurativa, hypotension,Sepsis due to cellulitis  Clinical Impression  The patient  reports that she was limited in mobility PTA  and has limited support. Patient  motivated to  progress but limited by weakness and pain today.  Pt admitted with above diagnosis.  Pt currently with functional limitations due to the deficits listed below (see PT Problem List). Pt will benefit from skilled PT to increase their independence and safety with mobility to allow discharge to the venue listed below.      BP 118/73, HR 126,  SPO2 100% RA.     Recommendations for follow up therapy are one component of a multi-disciplinary discharge planning process, led by the attending physician.  Recommendations may be updated based on patient status, additional functional criteria and insurance authorization.  Follow Up Recommendations Skilled nursing-short term rehab (<3 hours/day)    Assistance Recommended at Discharge Frequent or constant Supervision/Assistance  Patient can return home with the following  A little help with walking and/or transfers;A little help with bathing/dressing/bathroom;Help with stairs or ramp for entrance;Assist for transportation;Assistance with cooking/housework    Equipment Recommendations None recommended by PT  Recommendations for Other Services       Functional Status Assessment Patient has had a recent decline in their functional status and demonstrates the ability to make significant improvements in function in a reasonable and predictable amount of time.      Precautions / Restrictions Precautions Precautions: Fall Precaution Comments: BP  can be low, monitot  HT, also. Restrictions Weight Bearing Restrictions: No      Mobility  Bed Mobility Overal bed mobility: Needs Assistance Bed Mobility: Supine to Sit, Rolling Rolling: Supervision   Supine to sit: HOB elevated, Min guard     General bed mobility comments: for safety, used bed rail to roll then oush to sitting an immediately stood due to wounds    Transfers Overall transfer level: Needs assistance Equipment used: Rolling walker (2 wheels) Transfers: Sit to/from Stand, Bed to chair/wheelchair/BSC Sit to Stand: Min guard   Step pivot transfers: Min guard       General transfer comment: pt. reports feeling dizzy. Only took a few steps to recliner.    Ambulation/Gait                  Stairs            Wheelchair Mobility    Modified Rankin (Stroke Patients Only)       Balance Overall balance assessment: Mild deficits observed, not formally tested                                           Pertinent Vitals/Pain Pain Assessment Pain Assessment: Faces Faces Pain Scale: Hurts even more Pain Location: between buttocks Pain Descriptors / Indicators: Burning, Discomfort Pain Intervention(s): Monitored during session, Premedicated before session, Repositioned    Home Living Family/patient expects to be discharged to:: Private residence Living Arrangements: Alone Available Help at Discharge: Family Type of Home: Apartment Home Access: Stairs to enter   [employer] of Steps: 2  Home Layout: One level Home Equipment: Conservation officer, nature (2 wheels) Additional Comments: father vilable some but not helpful.    Prior Function Prior Level of Function : Independent/Modified Independent             Mobility Comments: recently limited to household distaces       Hand Dominance   Dominant Hand: Right     Extremity/Trunk Assessment   Upper Extremity Assessment Upper Extremity Assessment: Overall WFL for tasks assessed;LUE deficits/detail LUE Deficits / Details: has  some wounds in axilla area    Lower Extremity Assessment Lower Extremity Assessment: Generalized weakness    Cervical / Trunk Assessment Cervical / Trunk Assessment: Normal  Communication   Communication: No difficulties  Cognition Arousal/Alertness: Awake/alert Behavior During Therapy: WFL for tasks assessed/performed Overall Cognitive Status: Within Functional Limits for tasks assessed                                          General Comments      Exercises     Assessment/Plan    PT Assessment Patient needs continued PT services  PT Problem List Decreased strength;Decreased knowledge of precautions;Decreased activity tolerance;Decreased mobility;Decreased safety awareness;Decreased knowledge of use of DME       PT Treatment Interventions DME instruction;Therapeutic activities;Gait training;Therapeutic exercise;Patient/family education;Functional mobility training    PT Goals (Current goals can be found in the Care Plan section)  Acute Rehab PT Goals Patient Stated Goal: I  need to be stronger to go home PT Goal Formulation: With patient Time For Goal Achievement: 05/24/21 Potential to Achieve Goals: Good    Frequency Min 2X/week     Co-evaluation               AM-PAC PT "6 Clicks" Mobility  Outcome Measure Help needed turning from your back to your side while in a flat bed without using bedrails?: A Little Help needed moving from lying on your back to sitting on the side of a flat bed without using bedrails?: A Little Help needed moving to and from a bed to a chair (including a wheelchair)?: A Little Help needed standing up from a chair using your arms (e.g., wheelchair or bedside chair)?: A Little Help needed to walk in hospital room?: A Lot Help needed climbing 3-5 steps  with a railing? : A Lot 6 Click Score: 16    End of Session   Activity Tolerance: Patient limited by fatigue;Treatment limited secondary to medical complications (Comment) Patient left: in chair;with call bell/phone within reach Nurse Communication: Mobility status PT Visit Diagnosis: Unsteadiness on feet (R26.81);Difficulty in walking, not elsewhere classified (R26.2);Pain    Time: 5003-7048 PT Time Calculation (min) (ACUTE ONLY): 37 min   Charges:   PT Evaluation $PT Eval Low Complexity: 1 Low PT Treatments $Therapeutic Activity: 23-37 mins        Tresa Endo PT Acute Rehabilitation Services Pager [phone] Office [phone]   Claretha Cooper 05/10/2021, 12:04 PM

## 2021-05-10 NOTE — Plan of Care (Signed)
  Problem: Education: Goal: Knowledge of General Education information will improve Description Including pain rating scale, medication(s)/side effects and non-pharmacologic comfort measures Outcome: Progressing   Problem: Health Behavior/Discharge Planning: Goal: Ability to manage health-related needs will improve Outcome: Progressing   

## 2021-05-10 NOTE — Progress Notes (Signed)
PROGRESS NOTE    Amanda Davenport  FGH:[id number] DOB: October 14, 1980 DOA: 05/06/2021  PCP: Cipriano Mile, NP   Brief Narrative: This 41 year old morbidly obese female with PMH significant for hidradenitis suppurativa previously on Humira, iron deficiency anemia requiring as needed IV iron infusions, Wolff-Parkinson-White syndrome presented in the ED with concerns about worsening hidradenitis suppurativa.  Patient was evaluated by dermatology for the flareup under her breast in September and was started on Humira.  However patient reports minimal improvement.  She began to notice new abscesses under breasts and axillary and gluteal folds.  Her dermatologist has advised to stop Humira and started her on Flagyl, Moxifloxacin and rifampicin earlier this week. She was hypotensive on arrival.  CT abdomen and pelvis was negative for any acute processes.  Patient was started on empiric vancomycin and cefepime.  Infectious disease and general surgery consulted.  Assessment & Plan:   Principal Problem:   Sepsis due to cellulitis [facility]) Active Problems:   Hidradenitis suppurativa   Iron deficiency anemia   Wolff-Parkinson-White (WPW) syndrome   Morbid obesity (HCC)   Abscess  Severe sepsis sec. to abscesses from stage II hidradenitis suppurativa: Patient presented with hypotension, tachypnea, tachycardia, leukocytosis.  Lactic acid 1.7. Patient had innumerable abscesses mostly in the right axillary region,  lesions under both breasts,   groin and gluteal cleft. Continue empiric antibiotics ( vancomycin ,  cefepime and Flagyl for anaerobic coverage ). Infectious disease consulted to guide antibiotic regimen, recommended to change to oral Abx if blood culture negative. General surgery consulted, there is no indication for incision and drainage at this time. Adequate pain control with pain medications.  Blood cultures no growth to date. Continue IV hydration. Sepsis physiology resolved. Antibiotic changed  to ciprofloxacin and Flagyl.  Patient with follow-up with dermatologist outpatient. Sepsis physiology resolving.  Intermittent hypotension: Blood pressure has improved with IV hydration.  Wolff-Parkinson-White syndrome: Continue metoprolol.  Iron deficiency anemia: Hb is down trended to 8.5 from prior 11.2. She has received iron infusion in the past. Transfuse if hemoglobin drops below 7.0 Hemoglobin remained stable above 8.5.  Tachycardia /tachypnea: D-dimer elevated, obtain CTA chest to rule out PE. EKG sinus tachycardia, no ST-T wave changes   Morbid obesity: Encourage weight loss. PT and OT eval.   DVT prophylaxis: SCDs Code Status: Full code Family Communication: No family at bedside Disposition Plan:   Status is: Inpatient Remains inpatient appropriate because:  Admitted for multiple abscesses in the axillary, gluteal and pannus.  Requiring IV antibiotics, ID and general surgery consulted. PT and OT eval for mobility issues.   Planned Discharge Destination: Home with home services  Consultants:  Infectious diseases General surgery Wound care  Procedures: CT abdomen and pelvis  Antimicrobials:   Anti-infectives (From admission, onward)    Start     Dose/Rate Route Frequency Ordered Stop   05/08/21 1330  ciprofloxacin (CIPRO) tablet 500 mg        500 mg Oral 2 times daily 05/08/21 1231     05/08/21 1330  metroNIDAZOLE (FLAGYL) tablet 500 mg        500 mg Oral Every 12 hours 05/08/21 1231     05/07/21 0200  metroNIDAZOLE (FLAGYL) IVPB 500 mg  Status:  Discontinued        500 mg 100 mL/hr over 60 Minutes Intravenous Every 12 hours 05/07/21 0105 05/08/21 1231   05/07/21 0000  vancomycin (VANCOREADY) IVPB 1500 mg/300 mL  Status:  Discontinued        1,500 mg  150 mL/hr over 120 Minutes Intravenous Every 12 hours 05/06/21 1123 05/08/21 1231   05/06/21 2000  ceFEPIme (MAXIPIME) 2 g in sodium chloride 0.9 % 100 mL IVPB  Status:  Discontinued        2 g 200  mL/hr over 30 Minutes Intravenous Every 8 hours 05/06/21 1123 05/08/21 1231   05/06/21 1230  vancomycin (VANCOCIN) IVPB 1000 mg/200 mL premix        1,000 mg 200 mL/hr over 60 Minutes Intravenous  Once 05/06/21 1103 05/06/21 1507   05/06/21 1115  ceFEPIme (MAXIPIME) 2 g in sodium chloride 0.9 % 100 mL IVPB        2 g 200 mL/hr over 30 Minutes Intravenous  Once 05/06/21 1103 05/06/21 1150   05/06/21 1115  vancomycin (VANCOCIN) IVPB 1000 mg/200 mL premix        1,000 mg 200 mL/hr over 60 Minutes Intravenous  Once 05/06/21 1103 05/06/21 1238        Subjective: Patient was seen and examined at bedside.  Overnight events noted.   Patient reports not feeling well and states she has no energy to stand up.   She reports severe short of breath while getting out of bed. She has multiple lesions with draining tracts.   Objective: Vitals:   05/10/21 0515 05/10/21 0825 05/10/21 1021 05/10/21 1129  BP: (!) 90/55 111/75 95/65 (!) 88/74  Pulse: 99 86 (!) 103 93  Resp:  18 18 17   Temp: 98.7 F (37.1 C) 98.2 F (36.8 C) 98.4 F (36.9 C) 98.3 F (36.8 C)  TempSrc: Oral Oral Oral Oral  SpO2: 99% 100% 100% 100%  Weight:      Height:        Intake/Output Summary (Last 24 hours) at 05/10/2021 1131 Last data filed at 05/09/2021 1833 Gross per 24 hour  Intake 600 ml  Output 850 ml  Net -250 ml   Filed Weights   05/06/21 0947 05/07/21 1410  Weight: 130.2 kg 130.2 kg    Examination:  General exam: Appears deconditioned, comfortable, not in any distress. Respiratory system: Clear to auscultation bilaterally, respiratory effort normal, RR 13 Cardiovascular system: S1 & S2 heard, regular rate and rhythm, no murmur. Gastrointestinal system: Abdomen is soft, non tender, non distended, BS+ Central nervous system: Alert and oriented x 3 . No focal neurological deficits. Extremities: No edema, no cyanosis, no clubbing. Skin: Multiple erythematous lesions with ropelike tracts under breasts, groin,  bilateral lower extremities and right axilla. Psychiatry: Judgement and insight appear normal. Mood & affect appropriate.     Data Reviewed: I have personally reviewed following labs and imaging studies  CBC: Recent Labs  Lab 05/06/21 1007 05/07/21 0328 05/07/21 0841 05/08/21 0513 05/09/21 0431 05/10/21 0552  WBC 14.3* 10.4  --  11.3* 13.6*  --   NEUTROABS 9.9*  --   --   --   --   --   HGB 8.5* 7.8* 7.7* 7.7* 8.4* 8.8*  HCT 28.0* 25.8* 25.6* 25.4* 28.1* 29.0*  MCV 77.8* 80.1  --  80.4 81.0  --   PLT 749* 615*  --  576* 651*  --    Basic Metabolic Panel: Recent Labs  Lab 05/06/21 1007 05/08/21 0513 05/09/21 0431  NA 135 134* 133*  K 3.6 3.3* 3.6  CL 101 107 104  CO2 22 21* 22  GLUCOSE 121* 98 96  BUN 7 11 14   CREATININE 0.74 0.64 0.66  CALCIUM 9.0 8.4* 8.4*  MG 1.8 1.8  --  PHOS 2.4* 4.5  --    GFR: Estimated Creatinine Clearance: 137.5 mL/min (by C-G formula based on SCr of 0.66 mg/dL). Liver Function Tests: Recent Labs  Lab 05/06/21 1007  AST 15  ALT 5  ALKPHOS 64  BILITOT 0.3  PROT 9.2*  ALBUMIN 3.2*   No results for input(s): LIPASE, AMYLASE in the last 168 hours. No results for input(s): AMMONIA in the last 168 hours. Coagulation Profile: No results for input(s): INR, PROTIME in the last 168 hours. Cardiac Enzymes: No results for input(s): CKTOTAL, CKMB, CKMBINDEX, TROPONINI in the last 168 hours. BNP (last 3 results) No results for input(s): PROBNP in the last 8760 hours. HbA1C: No results for input(s): HGBA1C in the last 72 hours. CBG: No results for input(s): GLUCAP in the last 168 hours. Lipid Profile: No results for input(s): CHOL, HDL, LDLCALC, TRIG, CHOLHDL, LDLDIRECT in the last 72 hours. Thyroid Function Tests: No results for input(s): TSH, T4TOTAL, FREET4, T3FREE, THYROIDAB in the last 72 hours. Anemia Panel: No results for input(s): VITAMINB12, FOLATE, FERRITIN, TIBC, IRON, RETICCTPCT in the last 72 hours.  Sepsis Labs: Recent  Labs  Lab 05/06/21 1007 05/06/21 2120  LATICACIDVEN 1.7 1.2    Recent Results (from the past 240 hour(s))  Blood culture (routine x 2)     Status: None (Preliminary result)   Collection Time: 05/06/21 10:07 AM   Specimen: Right Antecubital; Blood  Result Value Ref Range Status   Specimen Description   Final    RIGHT ANTECUBITAL Performed at [facility], [address] [place], [place] [address]    Special Requests   Final    BOTTLES DRAWN AEROBIC ONLY Blood Culture adequate volume Performed at [facility], [address], [place], [place] [address]    Culture   Final    NO GROWTH 4 DAYS Performed at [facility], [place] [address], [place], [place] [address]    Report Status PENDING  Incomplete  Blood culture (routine x 2)     Status: None (Preliminary result)   Collection Time: 05/06/21 10:07 AM   Specimen: BLOOD LEFT HAND  Result Value Ref Range Status   Specimen Description   Final    BLOOD LEFT HAND Performed at [facility], [address], [place], [place] [address]    Special Requests   Final    BOTTLES DRAWN AEROBIC AND ANAEROBIC Blood Culture adequate volume Performed at [facility], [address] [place], [place] [address]    Culture   Final    NO GROWTH 4 DAYS Performed at [facility], [place] [address] [place], [place] [address]    Report Status PENDING  Incomplete  Resp Panel by RT-PCR (Flu A&B, Covid) Nasopharyngeal Swab     Status: None   Collection Time: 05/06/21  2:30 PM   Specimen: Nasopharyngeal Swab; Nasopharyngeal(NP) swabs in vial transport medium  Result Value Ref Range Status   SARS Coronavirus 2 by RT PCR NEGATIVE NEGATIVE Final    Comment: (NOTE) SARS-CoV-2 target nucleic acids are NOT DETECTED.  The SARS-CoV-2 RNA is generally detectable in upper respiratory specimens during the acute phase of infection. The lowest concentration of SARS-CoV-2  viral copies this assay can detect is 138 copies/mL. A negative result does not preclude SARS-Cov-2 infection and should not be used as the sole basis for treatment or other patient management decisions. A negative result may occur with  improper specimen collection/handling, submission of specimen other than nasopharyngeal swab, presence  of viral mutation(s) within the areas targeted by this assay, and inadequate number of viral copies(<138 copies/mL). A negative result must be combined with clinical observations, patient history, and epidemiological information. The expected result is Negative.  Fact Sheet for Patients:  EntrepreneurPulse.com.au  Fact Sheet for Healthcare Providers:  IncredibleEmployment.be  This test is no t yet approved or cleared by the [place] FDA and  has been authorized for detection and/or diagnosis of SARS-CoV-2 by FDA under an Emergency Use Authorization (EUA). This EUA will remain  in effect (meaning this test can be used) for the duration of the COVID-19 declaration under Section 564(b)(1) of the Act, 21 U.S.C.section 360bbb-3(b)(1), unless the authorization is terminated  or revoked sooner.       Influenza A by PCR NEGATIVE NEGATIVE Final   Influenza B by PCR NEGATIVE NEGATIVE Final    Comment: (NOTE) The Xpert Xpress SARS-CoV-2/FLU/RSV plus assay is intended as an aid in the diagnosis of influenza from Nasopharyngeal swab specimens and should not be used as a sole basis for treatment. Nasal washings and aspirates are unacceptable for Xpert Xpress SARS-CoV-2/FLU/RSV testing.  Fact Sheet for Patients: EntrepreneurPulse.com.au  Fact Sheet for Healthcare Providers: IncredibleEmployment.be  This test is not yet approved or cleared by the [place] FDA and has been authorized for detection and/or diagnosis of SARS-CoV-2 by FDA under an Emergency Use Authorization  (EUA). This EUA will remain in effect (meaning this test can be used) for the duration of the COVID-19 declaration under Section 564(b)(1) of the Act, 21 U.S.C. section 360bbb-3(b)(1), unless the authorization is terminated or revoked.  Performed at [facility], [address], [place], [place] [address]     Radiology Studies: No results found.   Scheduled Meds:  Chlorhexidine Gluconate Cloth  6 each Topical Daily   ciprofloxacin  500 mg Oral BID   metoprolol succinate  25 mg Oral Daily   metroNIDAZOLE  500 mg Oral Q12H   nutrition supplement (JUVEN)  1 packet Oral BID BM   Ensure Max Protein  11 oz Oral Daily   Continuous Infusions:  sodium chloride     sodium chloride Stopped (05/06/21 1807)   sodium chloride 150 mL/hr at 05/10/21 0545     LOS: 4 days    Time spent: 35 min    Kortney Schoenfelder, MD [facility]   If 7PM-7AM, please contact night-coverage

## 2021-05-10 NOTE — Progress Notes (Signed)
OT Cancellation Note  Patient Details Name: Amanda Davenport MRN: [id number] DOB: 02/24/1981   Cancelled Treatment:    Reason Eval/Treat Not Completed: Other (comment) Patient had just gotten back to bed with nursing at start of session. Patient requesting therapy check back later. OT to continue to follow and check back as schedule will allow.  [name] OTR/L, [place] Acute Rehabilitation Department Office# [phone] Pager# [phone]   05/10/2021, 3:08 PM

## 2021-05-11 DIAGNOSIS — A419 Sepsis, unspecified organism: Secondary | ICD-10-CM | POA: Diagnosis not present

## 2021-05-11 DIAGNOSIS — L0390 Cellulitis, unspecified: Secondary | ICD-10-CM | POA: Diagnosis not present

## 2021-05-11 LAB — CULTURE, BLOOD (ROUTINE X 2)
Culture: NO GROWTH
Culture: NO GROWTH
Special Requests: ADEQUATE
Special Requests: ADEQUATE

## 2021-05-11 LAB — HEMOGLOBIN AND HEMATOCRIT, BLOOD
HCT: 27.9 % — ABNORMAL LOW (ref 36.0–46.0)
Hemoglobin: 8.2 g/dL — ABNORMAL LOW (ref 12.0–15.0)

## 2021-05-11 NOTE — Evaluation (Signed)
Occupational Therapy Evaluation Patient Details Name: Amanda Davenport MRN: [id number] DOB: May 13, 1980 Today's Date: 05/11/2021   History of Present Illness This 41 year old morbidly obese female with PMH significant for hidradenitis suppurativa previously on Humira, iron deficiency anemia requiring as needed IV iron infusions, Wolff-Parkinson-White syndrome presented in the ED 05/06/21 with concerns about worsening hidradenitis suppurativa, hypotension,Sepsis due to cellulitis   Clinical Impression   Patient is a 41 year old female who was admitted for above. Patient was living at home alone independently prior level. Currently, patient is noted to have decreased activity tolerance and increased pain with movement impacting participation in ADLs. Patient reports endorses feeling weaker at this time.  Patient would need 24/7 support in next level of care to be successful at home. Patient would continue to benefit from skilled OT services at this time while admitted and after d/c to address noted deficits in order to improve overall safety and independence in ADLs.       Recommendations for follow up therapy are one component of a multi-disciplinary discharge planning process, led by the attending physician.  Recommendations may be updated based on patient status, additional functional criteria and insurance authorization.   Follow Up Recommendations  Skilled nursing-short term rehab (<3 hours/day)    Assistance Recommended at Discharge Frequent or constant Supervision/Assistance  Patient can return home with the following A little help with walking and/or transfers;A little help with bathing/dressing/bathroom;Assistance with cooking/housework;Direct supervision/assist for financial management;Direct supervision/assist for medications management;Help with stairs or ramp for entrance;Assist for transportation    Functional Status Assessment  Patient has had a recent decline in their functional  status and demonstrates the ability to make significant improvements in function in a reasonable and predictable amount of time.  Equipment Recommendations  Other (comment) (defer to next venue)    Recommendations for Other Services       Precautions / Restrictions Precautions Precautions: Fall Precaution Comments: BP runs low Restrictions Weight Bearing Restrictions: No      Mobility Bed Mobility Overal bed mobility: Needs Assistance Bed Mobility: Sidelying to Sit, Sit to Sidelying     Supine to sit: HOB elevated, Min guard          Transfers                          Balance Overall balance assessment: Mild deficits observed, not formally tested                                         ADL either performed or assessed with clinical judgement   ADL Overall ADL's : Needs assistance/impaired Eating/Feeding: Set up;Sitting   Grooming: Wash/dry face;Wash/dry hands;Set up;Bed level   Upper Body Bathing: Set up;Bed level   Lower Body Bathing: Sitting/lateral leans;Sit to/from stand;Maximal assistance   Upper Body Dressing : Minimal assistance;Sitting   Lower Body Dressing: Maximal assistance;Sit to/from stand;Sitting/lateral leans Lower Body Dressing Details (indicate cue type and reason): patient unable to sit secondary to pain in gluteal cleft. patient quick to fatigue in standing. Toilet Transfer: Moderate assistance Toilet Transfer Details (indicate cue type and reason): patient was able to transfer from edge of bed to standing with quick movement to avoid pressure on bottom. patient was able to take small side steps towards head of bed with min A Toileting- Clothing Manipulation and Hygiene: Moderate assistance;Sit to/from stand;Sitting/lateral lean Toileting -  Clothing Manipulation Details (indicate cue type and reason): unable to reach areas of bottom with sores on them per patient report.             Vision Patient Visual  Report: No change from baseline       Perception     Praxis      Pertinent Vitals/Pain Pain Assessment Pain Assessment: Faces Faces Pain Scale: Hurts even more Pain Location: between buttocks/vaginal area  "sores" Pain Descriptors / Indicators: Burning, Discomfort Pain Intervention(s): Monitored during session, Premedicated before session, Repositioned     Hand Dominance Right   Extremity/Trunk Assessment Upper Extremity Assessment Upper Extremity Assessment: RUE deficits/detail;LUE deficits/detail RUE Deficits / Details: shoulder wlbow and hand ROM WFL. elbow strength was 4/5 grip strength was 3+/5, shoulder strength not formally tested on this date. LUE Deficits / Details: has  some wounds in axilla area. reported having had armpit removed on L side secondary to wounds. ROM WFL grip strength was 3+/5, elbow strength 4/5   Lower Extremity Assessment Lower Extremity Assessment: Defer to PT evaluation   Cervical / Trunk Assessment Cervical / Trunk Assessment: Normal   Communication Communication Communication: No difficulties   Cognition Arousal/Alertness: Awake/alert Behavior During Therapy: WFL for tasks assessed/performed Overall Cognitive Status: Within Functional Limits for tasks assessed                                 General Comments: AxO x 3 pleasant. furstrated at current medical situation     General Comments  patient was notably frustrated with care at this time with added focus on hemoglobin. patient reported she felt like she was unable to advocate for herself well. patient's thoughts and feelings were validated. patient was educated on OT role in contiuum of care and benfits to participation in session. patient verbalized understanding.  nursing supervisor was educated on patients thoughts and concerns. nursing supervisor to speak with patient.    Exercises     Shoulder Instructions      Home Living Family/patient expects to be discharged  to:: Private residence Living Arrangements: Alone Available Help at Discharge: Family Type of Home: Apartment Home Access: Stairs to enter Technical brewer of Steps: 2   Home Layout: One level     Bathroom Shower/Tub: Tub/shower unit         Home Equipment: Conservation officer, nature (2 wheels)          Prior Functioning/Environment Prior Level of Function : Independent/Modified Independent             Mobility Comments: recently limited to household distaces ADLs Comments: independent in ADLs and IADLs        OT Problem List: Decreased activity tolerance;Impaired balance (sitting and/or standing);Decreased safety awareness;Cardiopulmonary status limiting activity;Decreased knowledge of precautions;Decreased knowledge of use of DME or AE      OT Treatment/Interventions: Self-care/ADL training;Energy conservation;DME and/or AE instruction;Therapeutic activities;Balance training;Patient/family education;Therapeutic exercise    OT Goals(Current goals can be found in the care plan section) Acute Rehab OT Goals Patient Stated Goal: to get medically stable OT Goal Formulation: With patient Time For Goal Achievement: 05/25/21 Potential to Achieve Goals: Good  OT Frequency: Min 2X/week    Co-evaluation              AM-PAC OT "6 Clicks" Daily Activity     Outcome Measure Help from another person eating meals?: None Help from another person taking care of personal grooming?: A  Little Help from another person toileting, which includes using toliet, bedpan, or urinal?: A Lot Help from another person bathing (including washing, rinsing, drying)?: A Lot Help from another person to put on and taking off regular upper body clothing?: A Little Help from another person to put on and taking off regular lower body clothing?: A Lot 6 Click Score: 16   End of Session Nurse Communication: Other (comment) (spoke with nursing supervisor over patients voiced concerns.)  Activity  Tolerance: Patient tolerated treatment well;Patient limited by pain Patient left: in bed;with call bell/phone within reach  OT Visit Diagnosis: Unsteadiness on feet (R26.81);Muscle weakness (generalized) (M62.81)                Time: 0962-8366 OT Time Calculation (min): 24 min Charges:  OT General Charges $OT Visit: 1 Visit OT Evaluation $OT Eval Moderate Complexity: 1 Mod OT Treatments $Therapeutic Activity: 8-22 mins  [name] OTR/L, [place] Acute Rehabilitation Department Office# [phone] Pager# [phone]   Marcellina Millin 05/11/2021, 3:41 PM

## 2021-05-11 NOTE — Progress Notes (Signed)
Physical Therapy Treatment Patient Details Name: Amanda Davenport MRN: [id number] DOB: 18-Jan-1981 Today's Date: 05/11/2021   History of Present Illness This 41 year old morbidly obese female with PMH significant for hidradenitis suppurativa previously on Humira, iron deficiency anemia requiring as needed IV iron infusions, Wolff-Parkinson-White syndrome presented in the ED 05/06/21 with concerns about worsening hidradenitis suppurativa, hypotension,Sepsis due to cellulitis    PT Comments    General Comments: AxO x 3 pleasant but required MAX encouragement to participate. General Gait Details: very limited amb distance of 22 feet due to MAX c/o weakness/fatigue and increased HA. Pt also unsteady and c/o B LE weakness with excessive lean on walker.  HIGH FALL RISK. Pt lives home alone and will need assist.  If assist not available then rec SNF.   Recommendations for follow up therapy are one component of a multi-disciplinary discharge planning process, led by the attending physician.  Recommendations may be updated based on patient status, additional functional criteria and insurance authorization.  Follow Up Recommendations  Skilled nursing-short term rehab (<3 hours/day)     Assistance Recommended at Discharge Frequent or constant Supervision/Assistance  Patient can return home with the following A little help with walking and/or transfers;A little help with bathing/dressing/bathroom;Help with stairs or ramp for entrance;Assist for transportation;Assistance with cooking/housework   Equipment Recommendations       Recommendations for Other Services       Precautions / Restrictions Precautions Precautions: Fall Precaution Comments: BP runs low Restrictions Weight Bearing Restrictions: No     Mobility  Bed Mobility Overal bed mobility: Needs Assistance Bed Mobility: Sidelying to Sit, Sit to Sidelying   Sidelying to sit: Supervision     Sit to sidelying: Supervision Pt self  able using bed rail and HOB elevated side rolling.   Transfers Overall transfer level: Needs assistance Equipment used: Rolling walker (2 wheels), None Transfers: Sit to/from Stand Sit to Stand: Min guard, Min assist           General transfer comment: pt unable to tolerate sitting EOB due to pain so was quick to stand from sidlying position holding to bed rail.    Ambulation/Gait Ambulation/Gait assistance: Min guard, Min assist Gait Distance (Feet): 22 Feet Assistive device: Rolling walker (2 wheels) Gait Pattern/deviations: Step-to pattern, Decreased step length - left, Decreased step length - right Gait velocity: decreased     General Gait Details: very limited amb distance of 22 feet due to MAX c/o weakness/fatigue and increased HA. Pt also unsteady and c/o B LE weakness with excessive lean on walker.  HIGH FALL RISK.   Stairs             Wheelchair Mobility    Modified Rankin (Stroke Patients Only)       Balance                                            Cognition Arousal/Alertness: Awake/alert Behavior During Therapy: WFL for tasks assessed/performed Overall Cognitive Status: Within Functional Limits for tasks assessed                                 General Comments: AxO x 3 pleasant but required MAX encouragement to participate.        Exercises      General Comments  Pertinent Vitals/Pain Pain Assessment Pain Assessment: Faces Pain Location: between buttocks/vaginal area  "sores" Pain Descriptors / Indicators: Burning, Discomfort Pain Intervention(s): Monitored during session, Repositioned    Home Living                          Prior Function            PT Goals (current goals can now be found in the care plan section) Progress towards PT goals: Progressing toward goals    Frequency    Min 2X/week      PT Plan Current plan remains appropriate    Co-evaluation               AM-PAC PT "6 Clicks" Mobility   Outcome Measure  Help needed turning from your back to your side while in a flat bed without using bedrails?: A Little Help needed moving from lying on your back to sitting on the side of a flat bed without using bedrails?: A Little Help needed moving to and from a bed to a chair (including a wheelchair)?: A Little Help needed standing up from a chair using your arms (e.g., wheelchair or bedside chair)?: A Little Help needed to walk in hospital room?: A Lot Help needed climbing 3-5 steps with a railing? : Total 6 Click Score: 15    End of Session Equipment Utilized During Treatment: Gait belt Activity Tolerance: Patient limited by fatigue;Treatment limited secondary to medical complications (Comment) Patient left: in bed;with call bell/phone within reach Nurse Communication: Mobility status PT Visit Diagnosis: Unsteadiness on feet (R26.81);Difficulty in walking, not elsewhere classified (R26.2);Pain     Time: 1220-1235 PT Time Calculation (min) (ACUTE ONLY): 15 min  Charges:  $Gait Training: 8-22 mins                     {Estevan Kersh  PTA Acute  Rehabilitation [employer]      [phone] Office      [phone]

## 2021-05-11 NOTE — Progress Notes (Signed)
PROGRESS NOTE    Amanda Davenport  WIO:[id number] DOB: [birth year] DOA: 05/06/2021  PCP: Cipriano Mile, NP   Brief Narrative: This 41 year old morbidly obese female with PMH significant for hidradenitis suppurativa previously on Humira, iron deficiency anemia requiring as needed IV iron infusions, Wolff-Parkinson-White syndrome presented in the ED with concerns about worsening hidradenitis suppurativa.  Patient was evaluated by dermatology for the flareup under her breast in September and was started on Humira.  However patient reports minimal improvement.  She began to notice new abscesses under breasts and axillary and gluteal folds.  Her dermatologist has advised to stop Humira and started her on Flagyl, Moxifloxacin and rifampicin earlier this week. She was hypotensive on arrival.  CT abdomen and pelvis was negative for any acute processes.  Patient was started on empiric vancomycin and cefepime.  Infectious disease and general surgery consulted. Sepsis ruled out.  Antibiotics changed to Cipro and Flagyl.  Patient will follow up with outpatient dermatologist.  PT recommended SNF for generalized weakness and dizziness.  Assessment & Plan:   Principal Problem:   Sepsis due to cellulitis [facility]) Active Problems:   Hidradenitis suppurativa   Iron deficiency anemia   Wolff-Parkinson-White (WPW) syndrome   Morbid obesity (HCC)   Abscess  Severe sepsis sec. to abscesses from stage II hidradenitis suppurativa: Patient presented with hypotension, tachypnea, tachycardia, leukocytosis.  Lactic acid 1.7. Patient had innumerable abscesses mostly in the right axillary region,  lesions under both breasts,   groin and gluteal cleft. Continued on empiric antibiotics ( vancomycin ,  cefepime and Flagyl for anaerobic coverage ). Infectious disease consulted to guide antibiotic regimen, recommended to change to oral Abx if blood culture negative. General surgery consulted, there is no indication for incision  and drainage at this time. Adequate pain control with pain medications.  Blood cultures no growth to date. Continue IV hydration. Sepsis physiology resolved. Antibiotic changed to ciprofloxacin and Flagyl.  Patient with follow-up with dermatologist outpatient. Sepsis physiology resolved.  Intermittent hypotension: Blood pressure has improved with IV hydration.  Wolff-Parkinson-White syndrome: Continue metoprolol.  Iron deficiency anemia: Hb is down trended to 8.5 from prior 11.2. She has received iron infusion in the past. Transfuse if hemoglobin drops below 7.0 Hemoglobin remained stable above 8.5.  Tachycardia /tachypnea: D-dimer elevated, CTA chest ruled out PE. EKG sinus tachycardia, no ST-T wave changes  Morbid obesity: Encourage weight loss. PT and OT recommended SNF.   DVT prophylaxis: SCDs Code Status: Full code Family Communication: No family at bedside Disposition Plan:   Status is: Inpatient Remains inpatient appropriate because:  Admitted for multiple abscesses in the axillary, gluteal and pannus.  Requiring IV antibiotics, ID and general surgery consulted. PT and OT eval for mobility issues.  No surgical intervention needed.  Antibiotic changed to oral Cipro and Flagyl.   Planned Discharge Destination: SNF  Consultants:  Infectious diseases General surgery Wound care  Procedures: CT abdomen and pelvis  Antimicrobials:   Anti-infectives (From admission, onward)    Start     Dose/Rate Route Frequency Ordered Stop   05/08/21 1330  ciprofloxacin (CIPRO) tablet 500 mg        500 mg Oral 2 times daily 05/08/21 1231     05/08/21 1330  metroNIDAZOLE (FLAGYL) tablet 500 mg        500 mg Oral Every 12 hours 05/08/21 1231     05/07/21 0200  metroNIDAZOLE (FLAGYL) IVPB 500 mg  Status:  Discontinued        500 mg 100  mL/hr over 60 Minutes Intravenous Every 12 hours 05/07/21 0105 05/08/21 1231   05/07/21 0000  vancomycin (VANCOREADY) IVPB 1500 mg/300 mL   Status:  Discontinued        1,500 mg 150 mL/hr over 120 Minutes Intravenous Every 12 hours 05/06/21 1123 05/08/21 1231   05/06/21 2000  ceFEPIme (MAXIPIME) 2 g in sodium chloride 0.9 % 100 mL IVPB  Status:  Discontinued        2 g 200 mL/hr over 30 Minutes Intravenous Every 8 hours 05/06/21 1123 05/08/21 1231   05/06/21 1230  vancomycin (VANCOCIN) IVPB 1000 mg/200 mL premix        1,000 mg 200 mL/hr over 60 Minutes Intravenous  Once 05/06/21 1103 05/06/21 1507   05/06/21 1115  ceFEPIme (MAXIPIME) 2 g in sodium chloride 0.9 % 100 mL IVPB        2 g 200 mL/hr over 30 Minutes Intravenous  Once 05/06/21 1103 05/06/21 1150   05/06/21 1115  vancomycin (VANCOCIN) IVPB 1000 mg/200 mL premix        1,000 mg 200 mL/hr over 60 Minutes Intravenous  Once 05/06/21 1103 05/06/21 1238        Subjective: Patient was seen and examined at bedside.  Overnight events noted.   Patient reports feeling very weak, states she is not able to stand up, scared to fall down. She reports severe short of breath while getting out of bed. She has multiple lesions with draining tracts.   Objective: Vitals:   05/10/21 2149 05/11/21 0625 05/11/21 1008 05/11/21 1010  BP: 116/72 97/63 90/67    Pulse: (!) 108 93 94 94  Resp: 18 18 19    Temp: 99.5 F (37.5 C) 98.3 F (36.8 C) 98.8 F (37.1 C)   TempSrc: Oral Oral Oral   SpO2: 99% 100% 100%   Weight:      Height:        Intake/Output Summary (Last 24 hours) at 05/11/2021 1124 Last data filed at 05/11/2021 0512 Gross per 24 hour  Intake 1206.07 ml  Output 2300 ml  Net -1093.93 ml   Filed Weights   05/06/21 0947 05/07/21 1410  Weight: 130.2 kg 130.2 kg    Examination:  General exam: Appears deconditioned, not in any acute distress,  lying comfortably. Respiratory system: Clear to auscultation bilaterally, respiratory effort normal, RR 15 Cardiovascular system: S1 & S2 heard, regular rate and rhythm, no murmur. Gastrointestinal system: Abdomen is soft, non  tender, non distended, BS+ Central nervous system: Alert and oriented x 3 . No focal neurological deficits. Extremities: No edema, no cyanosis, no clubbing. Skin: Multiple erythematous lesions with ropelike tracts under breasts, groin, bilateral lower extremities and right axilla. Psychiatry: Judgement and insight appear normal. Mood & affect appropriate.     Data Reviewed: I have personally reviewed following labs and imaging studies  CBC: Recent Labs  Lab 05/06/21 1007 05/07/21 0328 05/07/21 0841 05/08/21 0513 05/09/21 0431 05/10/21 0552 05/11/21 0526  WBC 14.3* 10.4  --  11.3* 13.6*  --   --   NEUTROABS 9.9*  --   --   --   --   --   --   HGB 8.5* 7.8* 7.7* 7.7* 8.4* 8.8* 8.2*  HCT 28.0* 25.8* 25.6* 25.4* 28.1* 29.0* 27.9*  MCV 77.8* 80.1  --  80.4 81.0  --   --   PLT 749* 615*  --  576* 651*  --   --    Basic Metabolic Panel: Recent Labs  Lab 05/06/21  1007 05/08/21 0513 05/09/21 0431  NA 135 134* 133*  K 3.6 3.3* 3.6  CL 101 107 104  CO2 22 21* 22  GLUCOSE 121* 98 96  BUN 7 11 14   CREATININE 0.74 0.64 0.66  CALCIUM 9.0 8.4* 8.4*  MG 1.8 1.8  --   PHOS 2.4* 4.5  --    GFR: Estimated Creatinine Clearance: 137.5 mL/min (by C-G formula based on SCr of 0.66 mg/dL). Liver Function Tests: Recent Labs  Lab 05/06/21 1007  AST 15  ALT 5  ALKPHOS 64  BILITOT 0.3  PROT 9.2*  ALBUMIN 3.2*   No results for input(s): LIPASE, AMYLASE in the last 168 hours. No results for input(s): AMMONIA in the last 168 hours. Coagulation Profile: No results for input(s): INR, PROTIME in the last 168 hours. Cardiac Enzymes: No results for input(s): CKTOTAL, CKMB, CKMBINDEX, TROPONINI in the last 168 hours. BNP (last 3 results) No results for input(s): PROBNP in the last 8760 hours. HbA1C: No results for input(s): HGBA1C in the last 72 hours. CBG: No results for input(s): GLUCAP in the last 168 hours. Lipid Profile: No results for input(s): CHOL, HDL, LDLCALC, TRIG, CHOLHDL,  LDLDIRECT in the last 72 hours. Thyroid Function Tests: No results for input(s): TSH, T4TOTAL, FREET4, T3FREE, THYROIDAB in the last 72 hours. Anemia Panel: No results for input(s): VITAMINB12, FOLATE, FERRITIN, TIBC, IRON, RETICCTPCT in the last 72 hours.  Sepsis Labs: Recent Labs  Lab 05/06/21 1007 05/06/21 2120  LATICACIDVEN 1.7 1.2    Recent Results (from the past 240 hour(s))  Blood culture (routine x 2)     Status: None (Preliminary result)   Collection Time: 05/06/21 10:07 AM   Specimen: Right Antecubital; Blood  Result Value Ref Range Status   Specimen Description   Final    RIGHT ANTECUBITAL Performed at [facility], [address], [place], [place] [address]    Special Requests   Final    BOTTLES DRAWN AEROBIC ONLY Blood Culture adequate volume Performed at [facility], [address] [place], [place] [address]    Culture   Final    NO GROWTH 4 DAYS Performed at [facility], [place] [address] [place], [place] [address]    Report Status PENDING  Incomplete  Blood culture (routine x 2)     Status: None (Preliminary result)   Collection Time: 05/06/21 10:07 AM   Specimen: BLOOD LEFT HAND  Result Value Ref Range Status   Specimen Description   Final    BLOOD LEFT HAND Performed at [facility], [address], [place], [place] [address]    Special Requests   Final    BOTTLES DRAWN AEROBIC AND ANAEROBIC Blood Culture adequate volume Performed at [facility], [address] [place], [place] [address]    Culture   Final    NO GROWTH 4 DAYS Performed at [facility], [place] [address], [place], [place] [address]    Report Status PENDING  Incomplete  Resp Panel by RT-PCR (Flu A&B, Covid) Nasopharyngeal Swab     Status: None   Collection Time: 05/06/21  2:30 PM   Specimen: Nasopharyngeal Swab; Nasopharyngeal(NP) swabs in vial transport medium  Result Value Ref Range Status    SARS Coronavirus 2 by RT PCR NEGATIVE NEGATIVE Final    Comment: (NOTE) SARS-CoV-2 target nucleic acids are NOT DETECTED.  The SARS-CoV-2 RNA is generally detectable in upper respiratory specimens during the acute phase of infection. The lowest concentration  of SARS-CoV-2 viral copies this assay can detect is 138 copies/mL. A negative result does not preclude SARS-Cov-2 infection and should not be used as the sole basis for treatment or other patient management decisions. A negative result may occur with  improper specimen collection/handling, submission of specimen other than nasopharyngeal swab, presence of viral mutation(s) within the areas targeted by this assay, and inadequate number of viral copies(<138 copies/mL). A negative result must be combined with clinical observations, patient history, and epidemiological information. The expected result is Negative.  Fact Sheet for Patients:  EntrepreneurPulse.com.au  Fact Sheet for Healthcare Providers:  IncredibleEmployment.be  This test is no t yet approved or cleared by the [place] FDA and  has been authorized for detection and/or diagnosis of SARS-CoV-2 by FDA under an Emergency Use Authorization (EUA). This EUA will remain  in effect (meaning this test can be used) for the duration of the COVID-19 declaration under Section 564(b)(1) of the Act, 21 U.S.C.section 360bbb-3(b)(1), unless the authorization is terminated  or revoked sooner.       Influenza A by PCR NEGATIVE NEGATIVE Final   Influenza B by PCR NEGATIVE NEGATIVE Final    Comment: (NOTE) The Xpert Xpress SARS-CoV-2/FLU/RSV plus assay is intended as an aid in the diagnosis of influenza from Nasopharyngeal swab specimens and should not be used as a sole basis for treatment. Nasal washings and aspirates are unacceptable for Xpert Xpress SARS-CoV-2/FLU/RSV testing.  Fact Sheet for  Patients: EntrepreneurPulse.com.au  Fact Sheet for Healthcare Providers: IncredibleEmployment.be  This test is not yet approved or cleared by the [place] FDA and has been authorized for detection and/or diagnosis of SARS-CoV-2 by FDA under an Emergency Use Authorization (EUA). This EUA will remain in effect (meaning this test can be used) for the duration of the COVID-19 declaration under Section 564(b)(1) of the Act, 21 U.S.C. section 360bbb-3(b)(1), unless the authorization is terminated or revoked.  Performed at [facility], [address], [place], [place] [address]     Radiology Studies: CT Angio Chest Pulmonary Embolism (PE) W or WO Contrast  Result Date: 05/10/2021 CLINICAL DATA:  41 year old female with shortness of breath and fever. EXAM: CT ANGIOGRAPHY CHEST WITH CONTRAST TECHNIQUE: Multidetector CT imaging of the chest was performed using the standard protocol during bolus administration of intravenous contrast. Multiplanar CT image reconstructions and MIPs were obtained to evaluate the vascular anatomy. RADIATION DOSE REDUCTION: This exam was performed according to the departmental dose-optimization program which includes automated exposure control, adjustment of the mA and/or kV according to patient size and/or use of iterative reconstruction technique. CONTRAST:  32mL OMNIPAQUE IOHEXOL 350 MG/ML SOLN COMPARISON:  05/10/2018, 08/09/2011 CTs and other studies. FINDINGS: Cardiovascular: This study is technically adequate for the evaluation pulmonary emboli. No pulmonary emboli are identified. UPPER limits normal heart size noted. LAD coronary artery atherosclerotic calcifications are noted. There is no evidence of thoracic aortic aneurysm or pericardial effusion. Mediastinum/Nodes: No mediastinal mass noted. UPPER limits normal sized bilateral axillary lymph nodes are unchanged from 2020. No other abnormal appearing lymph  nodes are noted. The visualized thyroid gland, esophagus and trachea are unremarkable. Lungs/Pleura: A 5 mm LEFT LOWER lobe (SUPERIOR segment) nodule (image 43: Series 10) and a 3 mm LEFT UPPER lobe nodule (28:10) appear new since 2020. A 3 mm RIGHT LOWER lobe nodule (image 59: Series 10) is unchanged from 2013. There is no evidence of airspace disease, consolidation, mass, pleural effusion or pneumothorax. Upper Abdomen: No acute abnormality identified. Musculoskeletal: No acute or  suspicious bony abnormalities are noted. Review of the MIP images confirms the above findings. IMPRESSION: 1. No evidence of acute abnormality. No evidence of pulmonary emboli. 2. 5 mm LEFT LOWER lobe nodule and 3 mm LEFT LOWER lobe nodule, new since 2020. No follow-up needed if patient is low-risk (and has no known or suspected primary neoplasm). Non-contrast chest CT can be considered in 12 months if patient is high-risk. This recommendation follows the consensus statement: Guidelines for Management of Incidental Pulmonary Nodules Detected on CT Images: From the Fleischner Society 2017; Radiology 2017; 284:228-243. 3. Coronary artery disease. Electronically Signed   By: Margarette Canada M.D.   On: 05/10/2021 12:51     Scheduled Meds:  Chlorhexidine Gluconate Cloth  6 each Topical Daily   ciprofloxacin  500 mg Oral BID   metoprolol succinate  25 mg Oral Daily   metroNIDAZOLE  500 mg Oral Q12H   nutrition supplement (JUVEN)  1 packet Oral BID BM   Ensure Max Protein  11 oz Oral Daily   Continuous Infusions:  sodium chloride     sodium chloride Stopped (05/06/21 1807)   sodium chloride 150 mL/hr at 05/11/21 1054     LOS: 5 days    Time spent: 35 min    Delton Stelle, MD [facility]   If 7PM-7AM, please contact night-coverage

## 2021-05-12 DIAGNOSIS — L0390 Cellulitis, unspecified: Secondary | ICD-10-CM | POA: Diagnosis not present

## 2021-05-12 DIAGNOSIS — A419 Sepsis, unspecified organism: Secondary | ICD-10-CM | POA: Diagnosis not present

## 2021-05-12 LAB — CBC
HCT: 25.8 % — ABNORMAL LOW (ref 36.0–46.0)
Hemoglobin: 7.8 g/dL — ABNORMAL LOW (ref 12.0–15.0)
MCH: 24.4 pg — ABNORMAL LOW (ref 26.0–34.0)
MCHC: 30.2 g/dL (ref 30.0–36.0)
MCV: 80.6 fL (ref 80.0–100.0)
Platelets: 613 10*3/uL — ABNORMAL HIGH (ref 150–400)
RBC: 3.2 MIL/uL — ABNORMAL LOW (ref 3.87–5.11)
RDW: 18.1 % — ABNORMAL HIGH (ref 11.5–15.5)
WBC: 9.2 10*3/uL (ref 4.0–10.5)
nRBC: 0 % (ref 0.0–0.2)

## 2021-05-12 LAB — BASIC METABOLIC PANEL
Anion gap: 6 (ref 5–15)
BUN: 10 mg/dL (ref 6–20)
CO2: 24 mmol/L (ref 22–32)
Calcium: 8.4 mg/dL — ABNORMAL LOW (ref 8.9–10.3)
Chloride: 107 mmol/L (ref 98–111)
Creatinine, Ser: 0.58 mg/dL (ref 0.44–1.00)
GFR, Estimated: >60 mL/min (ref >60)
Glucose, Bld: 96 mg/dL (ref 70–99)
Potassium: 3.8 mmol/L (ref 3.5–5.1)
Sodium: 137 mmol/L (ref 135–145)

## 2021-05-12 LAB — HEMOGLOBIN AND HEMATOCRIT, BLOOD
HCT: 30.2 % — ABNORMAL LOW (ref 36.0–46.0)
Hemoglobin: 9.3 g/dL — ABNORMAL LOW (ref 12.0–15.0)

## 2021-05-12 LAB — PREPARE RBC (CROSSMATCH)

## 2021-05-12 MED ORDER — SODIUM CHLORIDE 0.9% IV SOLUTION: Freq: Once | INTRAVENOUS | Status: AC

## 2021-05-12 MED ORDER — FLUCONAZOLE 150 MG PO TABS
150.0000 mg | ORAL_TABLET | Freq: Once | ORAL | Status: AC
  Administered 2021-05-12: 150 mg via ORAL
  Filled 2021-05-12: qty 1

## 2021-05-12 NOTE — Care Management Important Message (Signed)
Important Message  Patient Details IM Letter given to the Patient Name: Amanda Davenport MRN: [id number] Date of Birth: 1980-04-13   Medicare Important Message Given:  Yes     Kerin Salen 05/12/2021, 2:11 PM

## 2021-05-12 NOTE — Progress Notes (Signed)
PROGRESS NOTE    Amanda Davenport  OHY:[id number] DOB: Apr 28, 1980 DOA: 05/06/2021  PCP: Cipriano Mile, NP   Brief Narrative: This 41 year old morbidly obese female with PMH significant for hidradenitis suppurativa previously on Humira, iron deficiency anemia requiring as needed IV iron infusions, Wolff-Parkinson-White syndrome presented in the ED with concerns about worsening hidradenitis suppurativa.  Patient was evaluated by dermatology for the flareup under her breast in September and was started on Humira.  However patient reports minimal improvement.  She began to notice new abscesses under breasts and axillary and gluteal folds.  Her dermatologist has advised to stop Humira and started her on Flagyl, Moxifloxacin and rifampicin earlier this week. She was hypotensive on arrival.  CT abdomen and pelvis was negative for any acute processes.  Patient was started on empiric vancomycin and cefepime.  Infectious disease and general surgery consulted. Sepsis ruled out.  Antibiotics changed to Cipro and Flagyl.  Patient will follow up with outpatient dermatologist.  PT recommended SNF for generalized weakness and dizziness.  Assessment & Plan:   Principal Problem:   Sepsis due to cellulitis [facility]) Active Problems:   Hidradenitis suppurativa   Iron deficiency anemia   Wolff-Parkinson-White (WPW) syndrome   Morbid obesity (HCC)   Abscess  Severe sepsis sec. to abscesses from stage II hidradenitis suppurativa: Patient presented with hypotension, tachypnea, tachycardia, leukocytosis.  Lactic acid 1.7. Patient had innumerable abscesses mostly in the right axillary region,  lesions under both breasts,   groin and gluteal cleft. Continued on empiric antibiotics ( vancomycin ,  cefepime and Flagyl for anaerobic coverage ). Infectious disease consulted to guide antibiotic regimen, recommended to change to oral Abx if blood culture negative. General surgery consulted, there is no indication for incision  and drainage at this time. Adequate pain control with pain medications.  Blood cultures no growth to date. Continue IV hydration. Sepsis physiology resolved. Antibiotic changed to ciprofloxacin and Flagyl.  Patient with follow-up with dermatologist outpatient. Sepsis physiology resolved.  Intermittent hypotension: Blood pressure has improved with IV hydration.  Wolff-Parkinson-White syndrome: Continue metoprolol.  Iron deficiency anemia: Hb is down trended to 8.5 from prior 11.2. She has received iron infusion in the past. Hemoglobin remained stable above 8.0 Patient wanted to have a blood transfusion, states she feels sob while exertion. Follow-up post transfusion CBC.  Tachycardia /tachypnea: D-dimer elevated, CTA chest ruled out PE. EKG sinus tachycardia, no ST-T wave changes  Morbid obesity: Encourage weight loss. PT and OT recommended SNF.   DVT prophylaxis: SCDs Code Status: Full code Family Communication: No family at bedside Disposition Plan:   Status is: Inpatient Remains inpatient appropriate because:  Admitted for multiple abscesses in the axillary, gluteal and pannus.  Requiring IV antibiotics, ID and general surgery consulted. PT and OT eval for mobility issues.  No surgical intervention needed.  Antibiotic changed to oral Cipro and Flagyl.   Planned Discharge Destination: SNF  Consultants:  Infectious diseases General surgery Wound care  Procedures: CT abdomen and pelvis  Antimicrobials:   Anti-infectives (From admission, onward)    Start     Dose/Rate Route Frequency Ordered Stop   05/08/21 1330  ciprofloxacin (CIPRO) tablet 500 mg        500 mg Oral 2 times daily 05/08/21 1231     05/08/21 1330  metroNIDAZOLE (FLAGYL) tablet 500 mg        500 mg Oral Every 12 hours 05/08/21 1231     05/07/21 0200  metroNIDAZOLE (FLAGYL) IVPB 500 mg  Status:  Discontinued        500 mg 100 mL/hr over 60 Minutes Intravenous Every 12 hours 05/07/21 0105 05/08/21  1231   05/07/21 0000  vancomycin (VANCOREADY) IVPB 1500 mg/300 mL  Status:  Discontinued        1,500 mg 150 mL/hr over 120 Minutes Intravenous Every 12 hours 05/06/21 1123 05/08/21 1231   05/06/21 2000  ceFEPIme (MAXIPIME) 2 g in sodium chloride 0.9 % 100 mL IVPB  Status:  Discontinued        2 g 200 mL/hr over 30 Minutes Intravenous Every 8 hours 05/06/21 1123 05/08/21 1231   05/06/21 1230  vancomycin (VANCOCIN) IVPB 1000 mg/200 mL premix        1,000 mg 200 mL/hr over 60 Minutes Intravenous  Once 05/06/21 1103 05/06/21 1507   05/06/21 1115  ceFEPIme (MAXIPIME) 2 g in sodium chloride 0.9 % 100 mL IVPB        2 g 200 mL/hr over 30 Minutes Intravenous  Once 05/06/21 1103 05/06/21 1150   05/06/21 1115  vancomycin (VANCOCIN) IVPB 1000 mg/200 mL premix        1,000 mg 200 mL/hr over 60 Minutes Intravenous  Once 05/06/21 1103 05/06/21 1238        Subjective: Patient was seen and examined at bedside.  Overnight events noted.   Patient reports feeling very weak,  states she is not able to stand up. She also reports getting short of breath while getting out of bed. She has multiple lesions with draining tracts.   Objective: Vitals:   05/11/21 1008 05/11/21 1010 05/11/21 2046 05/12/21 0625  BP: 90/67  108/65 (!) 103/59  Pulse: 94 94 100 96  Resp: 19  15 16   Temp: 98.8 F (37.1 C)  98.6 F (37 C) 98.6 F (37 C)  TempSrc: Oral  Oral Oral  SpO2: 100%  100% 99%  Weight:      Height:        Intake/Output Summary (Last 24 hours) at 05/12/2021 1229 Last data filed at 05/12/2021 1146 Gross per 24 hour  Intake 6272.05 ml  Output 5000 ml  Net 1272.05 ml   Filed Weights   05/06/21 0947 05/07/21 1410  Weight: 130.2 kg 130.2 kg    Examination:  General exam: Appears comfortable, deconditioned, not in any distress. Respiratory system: Clear to auscultation bilaterally, respiratory effort normal, RR 12 Cardiovascular system: S1 & S2 heard, regular rate and rhythm, no  murmur. Gastrointestinal system: Abdomen is soft, non tender, non distended, BS+ Central nervous system: Alert and oriented x 3 . No focal neurological deficits. Extremities: No edema, no cyanosis, no clubbing. Skin: Multiple erythematous lesions with ropelike tracts under breasts, groin, bilateral lower extremities and right axilla. Psychiatry: Judgement and insight appear normal. Mood & affect appropriate.     Data Reviewed: I have personally reviewed following labs and imaging studies  CBC: Recent Labs  Lab 05/06/21 1007 05/07/21 0328 05/07/21 0841 05/08/21 0513 05/09/21 0431 05/10/21 0552 05/11/21 0526 05/12/21 0431  WBC 14.3* 10.4  --  11.3* 13.6*  --   --  9.2  NEUTROABS 9.9*  --   --   --   --   --   --   --   HGB 8.5* 7.8*   < > 7.7* 8.4* 8.8* 8.2* 7.8*  HCT 28.0* 25.8*   < > 25.4* 28.1* 29.0* 27.9* 25.8*  MCV 77.8* 80.1  --  80.4 81.0  --   --  80.6  PLT 749* 615*  --  576* 651*  --   --  613*   < > = values in this interval not displayed.   Basic Metabolic Panel: Recent Labs  Lab 05/06/21 1007 05/08/21 0513 05/09/21 0431 05/12/21 0431  NA 135 134* 133* 137  K 3.6 3.3* 3.6 3.8  CL 101 107 104 107  CO2 22 21* 22 24  GLUCOSE 121* 98 96 96  BUN 7 11 14 10   CREATININE 0.74 0.64 0.66 0.58  CALCIUM 9.0 8.4* 8.4* 8.4*  MG 1.8 1.8  --   --   PHOS 2.4* 4.5  --   --    GFR: Estimated Creatinine Clearance: 137.5 mL/min (by C-G formula based on SCr of 0.58 mg/dL). Liver Function Tests: Recent Labs  Lab 05/06/21 1007  AST 15  ALT 5  ALKPHOS 64  BILITOT 0.3  PROT 9.2*  ALBUMIN 3.2*   No results for input(s): LIPASE, AMYLASE in the last 168 hours. No results for input(s): AMMONIA in the last 168 hours. Coagulation Profile: No results for input(s): INR, PROTIME in the last 168 hours. Cardiac Enzymes: No results for input(s): CKTOTAL, CKMB, CKMBINDEX, TROPONINI in the last 168 hours. BNP (last 3 results) No results for input(s): PROBNP in the last 8760  hours. HbA1C: No results for input(s): HGBA1C in the last 72 hours. CBG: No results for input(s): GLUCAP in the last 168 hours. Lipid Profile: No results for input(s): CHOL, HDL, LDLCALC, TRIG, CHOLHDL, LDLDIRECT in the last 72 hours. Thyroid Function Tests: No results for input(s): TSH, T4TOTAL, FREET4, T3FREE, THYROIDAB in the last 72 hours. Anemia Panel: No results for input(s): VITAMINB12, FOLATE, FERRITIN, TIBC, IRON, RETICCTPCT in the last 72 hours.  Sepsis Labs: Recent Labs  Lab 05/06/21 1007 05/06/21 2120  LATICACIDVEN 1.7 1.2    Recent Results (from the past 240 hour(s))  Blood culture (routine x 2)     Status: None   Collection Time: 05/06/21 10:07 AM   Specimen: Right Antecubital; Blood  Result Value Ref Range Status   Specimen Description   Final    RIGHT ANTECUBITAL Performed at [facility], [address], [place], [place] [address]    Special Requests   Final    BOTTLES DRAWN AEROBIC ONLY Blood Culture adequate volume Performed at [facility], [address], [place], [place] [address]    Culture   Final    NO GROWTH 5 DAYS Performed at [facility], [place] [address] [place], [place] [address]    Report Status 05/11/2021 FINAL  Final  Blood culture (routine x 2)     Status: None   Collection Time: 05/06/21 10:07 AM   Specimen: BLOOD LEFT HAND  Result Value Ref Range Status   Specimen Description   Final    BLOOD LEFT HAND Performed at [facility], [address] [place], [place] [address]    Special Requests   Final    BOTTLES DRAWN AEROBIC AND ANAEROBIC Blood Culture adequate volume Performed at [facility], [address] [place], [place] [address]    Culture   Final    NO GROWTH 5 DAYS Performed at [facility], [place] [address] [place], [place] [address]    Report Status 05/11/2021 FINAL  Final  Resp Panel by RT-PCR (Flu A&B, Covid) Nasopharyngeal  Swab     Status: None   Collection Time: 05/06/21  2:30 PM   Specimen: Nasopharyngeal Swab; Nasopharyngeal(NP) swabs in vial transport medium  Result Value Ref Range  Status   SARS Coronavirus 2 by RT PCR NEGATIVE NEGATIVE Final    Comment: (NOTE) SARS-CoV-2 target nucleic acids are NOT DETECTED.  The SARS-CoV-2 RNA is generally detectable in upper respiratory specimens during the acute phase of infection. The lowest concentration of SARS-CoV-2 viral copies this assay can detect is 138 copies/mL. A negative result does not preclude SARS-Cov-2 infection and should not be used as the sole basis for treatment or other patient management decisions. A negative result may occur with  improper specimen collection/handling, submission of specimen other than nasopharyngeal swab, presence of viral mutation(s) within the areas targeted by this assay, and inadequate number of viral copies(<138 copies/mL). A negative result must be combined with clinical observations, patient history, and epidemiological information. The expected result is Negative.  Fact Sheet for Patients:  EntrepreneurPulse.com.au  Fact Sheet for Healthcare Providers:  IncredibleEmployment.be  This test is no t yet approved or cleared by the [place] FDA and  has been authorized for detection and/or diagnosis of SARS-CoV-2 by FDA under an Emergency Use Authorization (EUA). This EUA will remain  in effect (meaning this test can be used) for the duration of the COVID-19 declaration under Section 564(b)(1) of the Act, 21 U.S.C.section 360bbb-3(b)(1), unless the authorization is terminated  or revoked sooner.       Influenza A by PCR NEGATIVE NEGATIVE Final   Influenza B by PCR NEGATIVE NEGATIVE Final    Comment: (NOTE) The Xpert Xpress SARS-CoV-2/FLU/RSV plus assay is intended as an aid in the diagnosis of influenza from Nasopharyngeal swab specimens and should not be used as a sole  basis for treatment. Nasal washings and aspirates are unacceptable for Xpert Xpress SARS-CoV-2/FLU/RSV testing.  Fact Sheet for Patients: EntrepreneurPulse.com.au  Fact Sheet for Healthcare Providers: IncredibleEmployment.be  This test is not yet approved or cleared by the [place] FDA and has been authorized for detection and/or diagnosis of SARS-CoV-2 by FDA under an Emergency Use Authorization (EUA). This EUA will remain in effect (meaning this test can be used) for the duration of the COVID-19 declaration under Section 564(b)(1) of the Act, 21 U.S.C. section 360bbb-3(b)(1), unless the authorization is terminated or revoked.  Performed at [facility], [address], [place],  [address]     Radiology Studies: CT Angio Chest Pulmonary Embolism (PE) W or WO Contrast  Result Date: 05/10/2021 CLINICAL DATA:  41 year old female with shortness of breath and fever. EXAM: CT ANGIOGRAPHY CHEST WITH CONTRAST TECHNIQUE: Multidetector CT imaging of the chest was performed using the standard protocol during bolus administration of intravenous contrast. Multiplanar CT image reconstructions and MIPs were obtained to evaluate the vascular anatomy. RADIATION DOSE REDUCTION: This exam was performed according to the departmental dose-optimization program which includes automated exposure control, adjustment of the mA and/or kV according to patient size and/or use of iterative reconstruction technique. CONTRAST:  59mL OMNIPAQUE IOHEXOL 350 MG/ML SOLN COMPARISON:  05/10/2018, 08/09/2011 CTs and other studies. FINDINGS: Cardiovascular: This study is technically adequate for the evaluation pulmonary emboli. No pulmonary emboli are identified. UPPER limits normal heart size noted. LAD coronary artery atherosclerotic calcifications are noted. There is no evidence of thoracic aortic aneurysm or pericardial effusion. Mediastinum/Nodes: No  mediastinal mass noted. UPPER limits normal sized bilateral axillary lymph nodes are unchanged from 2020. No other abnormal appearing lymph nodes are noted. The visualized thyroid gland, esophagus and trachea are unremarkable. Lungs/Pleura: A 5 mm LEFT LOWER lobe (SUPERIOR segment) nodule (image 43: Series 10) and a 3 mm LEFT UPPER lobe  nodule (28:10) appear new since 2020. A 3 mm RIGHT LOWER lobe nodule (image 59: Series 10) is unchanged from 2013. There is no evidence of airspace disease, consolidation, mass, pleural effusion or pneumothorax. Upper Abdomen: No acute abnormality identified. Musculoskeletal: No acute or suspicious bony abnormalities are noted. Review of the MIP images confirms the above findings. IMPRESSION: 1. No evidence of acute abnormality. No evidence of pulmonary emboli. 2. 5 mm LEFT LOWER lobe nodule and 3 mm LEFT LOWER lobe nodule, new since 2020. No follow-up needed if patient is low-risk (and has no known or suspected primary neoplasm). Non-contrast chest CT can be considered in 12 months if patient is high-risk. This recommendation follows the consensus statement: Guidelines for Management of Incidental Pulmonary Nodules Detected on CT Images: From the Fleischner Society 2017; Radiology 2017; 284:228-243. 3. Coronary artery disease. Electronically Signed   By: Margarette Canada M.D.   On: 05/10/2021 12:51     Scheduled Meds:  sodium chloride   Intravenous Once   Chlorhexidine Gluconate Cloth  6 each Topical Daily   ciprofloxacin  500 mg Oral BID   metoprolol succinate  25 mg Oral Daily   metroNIDAZOLE  500 mg Oral Q12H   nutrition supplement (JUVEN)  1 packet Oral BID BM   Ensure Max Protein  11 oz Oral Daily   Continuous Infusions:  sodium chloride     sodium chloride Stopped (05/06/21 1807)   sodium chloride 150 mL/hr at 05/12/21 0007     LOS: 6 days    Time spent: 35 min    Edger Husain, MD [facility]   If 7PM-7AM, please contact night-coverage

## 2021-05-12 NOTE — TOC Progression Note (Signed)
Transition of Care [facility]) - Progression Note    Patient Details  Name: CARLENE BICKLEY MRN: [id number] Date of Birth: 1980-12-18  [facility]) CM/SW Contact  Raylinn Kosar, Juliann Pulse, RN Phone Number: 05/12/2021, 12:56 PM  Clinical Narrative: Patient recc ST SNF-she declines SNF-prefers home w/HHC-[facility] already following-HHRN/PT. Has own transport home.      Expected Discharge Plan: [place] Barriers to Discharge: Continued Medical Work up  Expected Discharge Plan and Services Expected Discharge Plan: [place]   Discharge Planning Services: CM Consult Post Acute Care Choice: NA Living arrangements for the past 2 months: Single Family Home                                       Social Determinants of Health (SDOH) Interventions    Readmission Risk Interventions No flowsheet data found.

## 2021-05-13 LAB — HEMOGLOBIN AND HEMATOCRIT, BLOOD
HCT: 30.5 % — ABNORMAL LOW (ref 36.0–46.0)
HCT: 30.4 % — ABNORMAL LOW (ref 36.0–46.0)
Hemoglobin: 9.4 g/dL — ABNORMAL LOW (ref 12.0–15.0)
Hemoglobin: 9.4 g/dL — ABNORMAL LOW (ref 12.0–15.0)

## 2021-05-13 NOTE — Progress Notes (Signed)
PT Cancellation Note  Patient Details Name: Amanda Davenport MRN: [id number] DOB: 02-16-1981   Cancelled Treatment:     attempted to see pt 2 times: 10 am     "too early" and requested to sleep.  Pt agreed to 2 pm  2 pm       pt stated she just got back into bed from the bathroom and she was too wore out.  She stated she got "real short of breath" and won't be able to do Physical Therapy today.   Pt has been evaluated with rec for SNF however pt declines SNF and plans to return home.  Pt will need [name], [name] 05/13/2021, 3:11 PM

## 2021-05-13 NOTE — Progress Notes (Signed)
PROGRESS NOTE    EMRI SAMPLE  ZDG:[id number] DOB: Jun 09, 1980 DOA: 05/06/2021  PCP: Cipriano Mile, NP   Brief Narrative: This 41 year old morbidly obese female with PMH significant for hidradenitis suppurativa previously on Humira, iron deficiency anemia requiring as needed IV iron infusions, Wolff-Parkinson-White syndrome presented in the ED with concerns about worsening hidradenitis suppurativa.  Patient was evaluated by dermatology for the flare up under her breasts in September and was started on Humira.  However patient reports minimal improvement.  She began to notice new abscesses under breasts and axillary and gluteal folds.  Her dermatologist has advised to stop Humira and started her on Flagyl, Moxifloxacin and rifampicin earlier this week. She was hypotensive on arrival. CT abdomen and pelvis was negative for any acute processes.  Patient was started on empiric vancomycin and cefepime.  Infectious disease and general surgery were consulted. Sepsis ruled out.  Antibiotics changed to Cipro and Flagyl.  Patient will follow up with outpatient dermatologist.  PT recommended SNF for generalized weakness and dizziness.  Assessment & Plan:   Principal Problem:   Sepsis due to cellulitis [facility]) Active Problems:   Hidradenitis suppurativa   Iron deficiency anemia   Wolff-Parkinson-White (WPW) syndrome   Morbid obesity (HCC)   Abscess  Severe sepsis sec. to abscesses from stage II hidradenitis suppurativa: Patient presented with hypotension, tachypnea, tachycardia, leukocytosis.  Lactic acid 1.7. Patient had innumerable abscesses mostly in the right axillary region,  lesions under both breasts,   groin and gluteal cleft. Continued on empiric antibiotics ( vancomycin ,  cefepime and Flagyl for anaerobic coverage ). Infectious disease consulted to guide antibiotic regimen, recommended to change to oral Abx if blood culture negative. General surgery consulted, there is no indication for  incision and drainage at this time. Adequate pain control with pain medications.  Blood cultures no growth to date. Continued IV hydration. Sepsis physiology resolved. Antibiotic changed to ciprofloxacin and Flagyl.  Patient with follow-up with dermatologist outpatient. Sepsis physiology resolved.  Intermittent hypotension: Blood pressure has improved with IV hydration.  Wolff-Parkinson-White syndrome: Continue metoprolol.  Iron deficiency anemia: Hb has down trended to 8.5 from prior 11.2. She has received iron infusion in the past. Hemoglobin remained stable above 8.0 Patient wanted to have a blood transfusion, states she feels sob while exertion. S/p 1PRBC Hb 9.8  Tachycardia /Tachypnea: > Resolved. D-dimer elevated, CTA chest ruled out PE. EKG sinus tachycardia, no ST-T wave changes  Morbid obesity: Encourage weight loss. PT and OT recommended SNF. Patient does not want to go to rehab.   She wants to go home with home services.   DVT prophylaxis: SCDs Code Status: Full code Family Communication: No family at bedside Disposition Plan:   Status is: Inpatient Remains inpatient appropriate because:  Admitted for multiple abscesses in the axillary, gluteal and pannus.  Requiring IV antibiotics, ID and general surgery consulted. PT and OT eval for mobility issues.  No surgical intervention needed.  Antibiotic changed to oral Cipro and Flagyl. Patient will follow up with outpatient dermatologist.   Planned Discharge Destination: Home with home services. Anticipated discharge date 05/14/2021.  Consultants:  Infectious diseases General surgery Wound care  Procedures: CT abdomen and pelvis  Antimicrobials:   Anti-infectives (From admission, onward)    Start     Dose/Rate Route Frequency Ordered Stop   05/12/21 1845  fluconazole (DIFLUCAN) tablet 150 mg        150 mg Oral  Once 05/12/21 1758 05/12/21 1853   05/08/21 1330  ciprofloxacin (  CIPRO) tablet 500 mg        500  mg Oral 2 times daily 05/08/21 1231     05/08/21 1330  metroNIDAZOLE (FLAGYL) tablet 500 mg        500 mg Oral Every 12 hours 05/08/21 1231     05/07/21 0200  metroNIDAZOLE (FLAGYL) IVPB 500 mg  Status:  Discontinued        500 mg 100 mL/hr over 60 Minutes Intravenous Every 12 hours 05/07/21 0105 05/08/21 1231   05/07/21 0000  vancomycin (VANCOREADY) IVPB 1500 mg/300 mL  Status:  Discontinued        1,500 mg 150 mL/hr over 120 Minutes Intravenous Every 12 hours 05/06/21 1123 05/08/21 1231   05/06/21 2000  ceFEPIme (MAXIPIME) 2 g in sodium chloride 0.9 % 100 mL IVPB  Status:  Discontinued        2 g 200 mL/hr over 30 Minutes Intravenous Every 8 hours 05/06/21 1123 05/08/21 1231   05/06/21 1230  vancomycin (VANCOCIN) IVPB 1000 mg/200 mL premix        1,000 mg 200 mL/hr over 60 Minutes Intravenous  Once 05/06/21 1103 05/06/21 1507   05/06/21 1115  ceFEPIme (MAXIPIME) 2 g in sodium chloride 0.9 % 100 mL IVPB        2 g 200 mL/hr over 30 Minutes Intravenous  Once 05/06/21 1103 05/06/21 1150   05/06/21 1115  vancomycin (VANCOCIN) IVPB 1000 mg/200 mL premix        1,000 mg 200 mL/hr over 60 Minutes Intravenous  Once 05/06/21 1103 05/06/21 1238        Subjective: Patient was seen and examined at bedside.  Overnight events noted.   Patient reports feeling very weak,  states she is not able to stand up. She also reports getting short of breath while getting out of bed. She has multiple lesions with draining tracts.   Objective: Vitals:   05/12/21 1543 05/12/21 2043 05/13/21 0438 05/13/21 0924  BP: 105/69 103/71 106/64 118/72  Pulse: 90 97 89 94  Resp: 20 19 19 18   Temp: 99.2 F (37.3 C) 98.9 F (37.2 C) 98.3 F (36.8 C) 98.4 F (36.9 C)  TempSrc: Oral Oral Oral Oral  SpO2: 98% 99% 100% 99%  Weight:      Height:        Intake/Output Summary (Last 24 hours) at 05/13/2021 1033 Last data filed at 05/13/2021 0440 Gross per 24 hour  Intake 1533.75 ml  Output 6550 ml  Net -5016.25 ml    Filed Weights   05/06/21 0947 05/07/21 1410  Weight: 130.2 kg 130.2 kg    Examination:  General exam: Appears comfortable, deconditioned, not in any distress. Respiratory system: Clear to auscultation bilaterally, respiratory effort normal, RR 15 Cardiovascular system: S1 & S2 heard, regular rate and rhythm, no murmur. Gastrointestinal system: Abdomen is soft, non tender, non distended, BS+ Central nervous system: Alert and oriented x 3 . No focal neurological deficits. Extremities: No edema, no cyanosis, no clubbing. Skin: Multiple chronic hyperpigmented lesions with ropelike tracts under breasts, groin, bilateral lower extremities and right axilla. Psychiatry: Judgement and insight appear normal. Mood & affect appropriate.     Data Reviewed: I have personally reviewed following labs and imaging studies  CBC: Recent Labs  Lab 05/07/21 0328 05/07/21 0841 05/08/21 0513 05/09/21 0431 05/10/21 0552 05/11/21 0526 05/12/21 0431 05/12/21 1907 05/13/21 0506  WBC 10.4  --  11.3* 13.6*  --   --  9.2  --   --  HGB 7.8*   < > 7.7* 8.4* 8.8* 8.2* 7.8* 9.3* 9.4*  HCT 25.8*   < > 25.4* 28.1* 29.0* 27.9* 25.8* 30.2* 30.5*  MCV 80.1  --  80.4 81.0  --   --  80.6  --   --   PLT 615*  --  576* 651*  --   --  613*  --   --    < > = values in this interval not displayed.   Basic Metabolic Panel: Recent Labs  Lab 05/08/21 0513 05/09/21 0431 05/12/21 0431  NA 134* 133* 137  K 3.3* 3.6 3.8  CL 107 104 107  CO2 21* 22 24  GLUCOSE 98 96 96  BUN 11 14 10   CREATININE 0.64 0.66 0.58  CALCIUM 8.4* 8.4* 8.4*  MG 1.8  --   --   PHOS 4.5  --   --    GFR: Estimated Creatinine Clearance: 137.5 mL/min (by C-G formula based on SCr of 0.58 mg/dL). Liver Function Tests: No results for input(s): AST, ALT, ALKPHOS, BILITOT, PROT, ALBUMIN in the last 168 hours.  No results for input(s): LIPASE, AMYLASE in the last 168 hours. No results for input(s): AMMONIA in the last 168  hours. Coagulation Profile: No results for input(s): INR, PROTIME in the last 168 hours. Cardiac Enzymes: No results for input(s): CKTOTAL, CKMB, CKMBINDEX, TROPONINI in the last 168 hours. BNP (last 3 results) No results for input(s): PROBNP in the last 8760 hours. HbA1C: No results for input(s): HGBA1C in the last 72 hours. CBG: No results for input(s): GLUCAP in the last 168 hours. Lipid Profile: No results for input(s): CHOL, HDL, LDLCALC, TRIG, CHOLHDL, LDLDIRECT in the last 72 hours. Thyroid Function Tests: No results for input(s): TSH, T4TOTAL, FREET4, T3FREE, THYROIDAB in the last 72 hours. Anemia Panel: No results for input(s): VITAMINB12, FOLATE, FERRITIN, TIBC, IRON, RETICCTPCT in the last 72 hours.  Sepsis Labs: Recent Labs  Lab 05/06/21 2120  LATICACIDVEN 1.2    Recent Results (from the past 240 hour(s))  Blood culture (routine x 2)     Status: None   Collection Time: 05/06/21 10:07 AM   Specimen: Right Antecubital; Blood  Result Value Ref Range Status   Specimen Description   Final    RIGHT ANTECUBITAL Performed at [facility], [address] [place], [place] [address]    Special Requests   Final    BOTTLES DRAWN AEROBIC ONLY Blood Culture adequate volume Performed at [facility], [address] [place], [place] [address]    Culture   Final    NO GROWTH 5 DAYS Performed at [facility], [place] [address] [place], [place] [address]    Report Status 05/11/2021 FINAL  Final  Blood culture (routine x 2)     Status: None   Collection Time: 05/06/21 10:07 AM   Specimen: BLOOD LEFT HAND  Result Value Ref Range Status   Specimen Description   Final    BLOOD LEFT HAND Performed at [facility], [address] [place] [address], [place], [place] [address]    Special Requests   Final    BOTTLES DRAWN AEROBIC AND ANAEROBIC Blood Culture adequate volume Performed at [facility], [address] [place], [place] [address]    Culture   Final    NO GROWTH 5 DAYS Performed at [facility], [place] [address] [place],  [address]    Report Status 05/11/2021 FINAL  Final  Resp Panel by RT-PCR (  Flu A&B, Covid) Nasopharyngeal Swab     Status: None   Collection Time: 05/06/21  2:30 PM   Specimen: Nasopharyngeal Swab; Nasopharyngeal(NP) swabs in vial transport medium  Result Value Ref Range Status   SARS Coronavirus 2 by RT PCR NEGATIVE NEGATIVE Final    Comment: (NOTE) SARS-CoV-2 target nucleic acids are NOT DETECTED.  The SARS-CoV-2 RNA is generally detectable in upper respiratory specimens during the acute phase of infection. The lowest concentration of SARS-CoV-2 viral copies this assay can detect is 138 copies/mL. A negative result does not preclude SARS-Cov-2 infection and should not be used as the sole basis for treatment or other patient management decisions. A negative result may occur with  improper specimen collection/handling, submission of specimen other than nasopharyngeal swab, presence of viral mutation(s) within the areas targeted by this assay, and inadequate number of viral copies(<138 copies/mL). A negative result must be combined with clinical observations, patient history, and epidemiological information. The expected result is Negative.  Fact Sheet for Patients:  EntrepreneurPulse.com.au  Fact Sheet for Healthcare Providers:  IncredibleEmployment.be  This test is no t yet approved or cleared by the [place] FDA and  has been authorized for detection and/or diagnosis of SARS-CoV-2 by FDA under an Emergency Use Authorization (EUA). This EUA will remain  in effect (meaning this test can be used) for the duration of the COVID-19 declaration under Section 564(b)(1) of the Act, 21 U.S.C.section 360bbb-3(b)(1), unless the authorization is terminated  or revoked sooner.       Influenza A by PCR NEGATIVE  NEGATIVE Final   Influenza B by PCR NEGATIVE NEGATIVE Final    Comment: (NOTE) The Xpert Xpress SARS-CoV-2/FLU/RSV plus assay is intended as an aid in the diagnosis of influenza from Nasopharyngeal swab specimens and should not be used as a sole basis for treatment. Nasal washings and aspirates are unacceptable for Xpert Xpress SARS-CoV-2/FLU/RSV testing.  Fact Sheet for Patients: EntrepreneurPulse.com.au  Fact Sheet for Healthcare Providers: IncredibleEmployment.be  This test is not yet approved or cleared by the [place] FDA and has been authorized for detection and/or diagnosis of SARS-CoV-2 by FDA under an Emergency Use Authorization (EUA). This EUA will remain in effect (meaning this test can be used) for the duration of the COVID-19 declaration under Section 564(b)(1) of the Act, 21 U.S.C. section 360bbb-3(b)(1), unless the authorization is terminated or revoked.  Performed at [facility], [address] [place], [place] [address]     Radiology Studies: No results found.   Scheduled Meds:  Chlorhexidine Gluconate Cloth  6 each Topical Daily   ciprofloxacin  500 mg Oral BID   metoprolol succinate  25 mg Oral Daily   metroNIDAZOLE  500 mg Oral Q12H   nutrition supplement (JUVEN)  1 packet Oral BID BM   Ensure Max Protein  11 oz Oral Daily   Continuous Infusions:  sodium chloride     sodium chloride Stopped (05/06/21 1807)   sodium chloride 75 mL/hr at 05/13/21 0658     LOS: 7 days    Time spent: 35 min    Jonanthony Nahar, MD [facility]   If 7PM-7AM, please contact night-coverage

## 2021-05-13 NOTE — Plan of Care (Signed)
  Problem: Pain Managment: Goal: General experience of comfort will improve Outcome: Progressing   Problem: Safety: Goal: Ability to remain free from injury will improve Outcome: Progressing   

## 2021-05-14 MED ORDER — ACETAMINOPHEN-CODEINE #4 300-60 MG PO TABS: 1.0000 | ORAL_TABLET | ORAL | 0 refills | Status: DC | PRN

## 2021-05-14 MED ORDER — CIPROFLOXACIN HCL 500 MG PO TABS: 500.0000 mg | ORAL_TABLET | Freq: Two times a day (BID) | ORAL | 0 refills | Status: DC

## 2021-05-14 MED ORDER — METRONIDAZOLE 500 MG PO TABS: 500.0000 mg | ORAL_TABLET | Freq: Two times a day (BID) | ORAL | 0 refills | Status: DC

## 2021-05-14 NOTE — Discharge Instructions (Signed)
Advised to take ciprofloxacin and Flagyl as advised. Advised to follow-up with dermatologist as scheduled.   Advised to continue wound care.

## 2021-05-14 NOTE — Plan of Care (Signed)
°  Problem: Clinical Measurements: Goal: Diagnostic test results will improve Outcome: Progressing   Problem: Pain Managment: Goal: General experience of comfort will improve Outcome: Progressing   

## 2021-05-14 NOTE — TOC Transition Note (Signed)
Transition of Care [facility]) - CM/SW Discharge Note   Patient Details  Name: Amanda Davenport MRN: [id number] Date of Birth: 06-Jun-1980  Transition of Care [facility]) CM/SW Contact:  [name], RN Phone Number: 05/14/2021, 10:24 AM   Clinical Narrative:d/c home w/[facility]HRN/PT-[facility] rep Corene Cornea aware. Has own transport. See prior notes for additional info. No further CM needs.       Final next level of care: [place] Barriers to Discharge: No Barriers Identified   Patient Goals and CMS Choice Patient states their goals for this hospitalization and ongoing recovery are:: home CMS Medicare.gov Compare Post Acute Care list provided to:: Patient    Discharge Placement                       Discharge Plan and Services   Discharge Planning Services: CM Consult Post Acute Care Choice: NA                               Social Determinants of Health (SDOH) Interventions     Readmission Risk Interventions No flowsheet data found.

## 2021-05-14 NOTE — Discharge Summary (Signed)
Physician Discharge Summary  Amanda Davenport ESP:[id number] DOB: [birth year] DOA: 05/06/2021  PCP: Cipriano Mile, NP  Admit date: 05/06/2021  Discharge date: 05/14/2021  Admitted From: Home.  Disposition:  Home with Home services.  Recommendations for Outpatient Follow-up:  Follow up with PCP in 1-2 weeks. Please obtain BMP/CBC in one week Advised to take ciprofloxacin and Flagyl as advised. Advised to follow-up with dermatologist as scheduled.   Advised to continue wound care.  Home Health: Home PT, OT, RN Equipment/Devices: None  Discharge Condition: Stable  CODE STATUS:Full code Diet recommendation: Heart Healthy   Brief [facility] Course: This 41 year old morbidly obese female with PMH significant for hidradenitis suppurativa previously on Humira, iron deficiency anemia requiring as needed IV iron infusions, Wolff-Parkinson-White syndrome presented in the ED with concerns about worsening hidradenitis suppurativa.  Patient was evaluated by dermatology for the flare up under her breasts in September and was started on Humira.  However patient reports minimal improvement.  She began to notice new abscesses under breasts and axillary and gluteal folds.  Her dermatologist has advised to stop Humira and started her on Flagyl, Moxifloxacin and rifampicin earlier this week. She was hypotensive on arrival. CT abdomen and pelvis was negative for any acute processes.  Patient was started on empiric vancomycin and cefepime.  Infectious disease and general surgery were consulted. Sepsis ruled out.  Antibiotics changed to Cipro and Flagyl.  Patient will follow up with outpatient dermatologist.  PT recommended SNF for generalized weakness and dizziness.  Patient was given 1 unit of blood transfusion given her low hemoglobin.  There was no obvious visible bleeding.  Hemoglobin remained stable. Patient felt better,  Patient refused to go to nursing home.  Patient wants to go home Home and services  been arranged . Patient is being discharged home.  She was managed for below problems.  Discharge Diagnoses:  Principal Problem:   Sepsis due to cellulitis [facility]) Active Problems:   Hidradenitis suppurativa   Iron deficiency anemia   Wolff-Parkinson-White (WPW) syndrome   Morbid obesity (HCC)   Abscess  Severe sepsis sec. to abscesses from stage II hidradenitis suppurativa: Patient presented with hypotension, tachypnea, tachycardia, leukocytosis.  Lactic acid 1.7. Patient had innumerable abscesses mostly in the right axillary region,  lesions under both breasts,   groin and gluteal cleft. Continued on empiric antibiotics ( vancomycin ,  cefepime and Flagyl for anaerobic coverage ). Infectious disease consulted to guide antibiotic regimen, recommended to change to oral Abx if blood culture negative. General surgery consulted, there is no indication for incision and drainage at this time. Adequate pain control with pain medications.  Blood cultures no growth to date. Continued IV hydration. Sepsis physiology resolved. Antibiotic changed to ciprofloxacin and Flagyl.  Patient with follow-up with dermatologist outpatient. Sepsis physiology resolved.   Intermittent hypotension: Blood pressure has improved with IV hydration.   Wolff-Parkinson-White syndrome: Continue metoprolol.   Iron deficiency anemia: Hb has down trended to 8.5 from prior 11.2. She has received iron infusion in the past. Hemoglobin remained stable above 8.0 Patient wanted to have a blood transfusion, states she feels sob while exertion. S/p 1PRBC Hb 9.8   Tachycardia /Tachypnea: > Resolved. D-dimer elevated, CTA chest ruled out PE. EKG sinus tachycardia, no ST-T wave changes   Morbid obesity: Encourage weight loss. PT and OT recommended SNF. Patient does not want to go to rehab.   She wants to go home with home services.    Discharge Instructions  Discharge Instructions  Call MD for:  difficulty  breathing, headache or visual disturbances   Complete by: As directed    Call MD for:  persistant dizziness or light-headedness   Complete by: As directed    Call MD for:  persistant nausea and vomiting   Complete by: As directed    Diet - low sodium heart healthy   Complete by: As directed    Diet Carb Modified   Complete by: As directed    Discharge instructions   Complete by: As directed    Advised to follow-up with primary care physician in 1 week. Advised to take ciprofloxacin and Flagyl as advised. Advised to follow-up with dermatologist as scheduled.   Advised to continue wound care.   Discharge wound care:   Complete by: As directed    Continue wound care.  Patient is being instructed how to do dressing.   Increase activity slowly   Complete by: As directed       Allergies as of 05/14/2021       Reactions   Other Other (See Comments)   All Antibiotics cause severe vaginal yeast infections   Penicillins Hives, Itching, Swelling, Rash   Has patient had a PCN reaction causing immediate rash, facial/tongue/throat swelling, SOB or lightheadedness with hypotension: Yes Has patient had a PCN reaction causing severe rash involving mucus membranes or skin necrosis: Yes Has patient had a PCN reaction that required hospitalization Yes Has patient had a PCN reaction occurring within the last 10 years: Yes If all of the above answers are "NO", then may proceed with Cephalosporin use. Has patient had a PCN reaction causing immediate rash, facial/tongue/throat swelling, SOB or lightheadedness with hypotension: Yes Has patient had a PCN reaction causing severe rash involving mucus membranes or skin necrosis: Yes Has patient had a PCN reaction that required hospitalization Yes Has patient had a PCN reaction occurring within the last 10 years: Yes If all of the above answers are "NO", then may proceed with Cephalosporin use. Has patient had a PCN reaction causing immediate rash,  facial/tongue/throat swelling, SOB or lightheadedness with hypotension: Yes Has patient had a PCN reaction causing severe rash involving mucus membranes or skin necrosis: Yes Has patient had a PCN reaction that required hospitalization Yes Has patient had a PCN reaction occurring within the last 10 years: Yes If all of the above answers are "NO", then may proceed with Cephalosporin use.   Shellfish-derived Products Hives   Shrimp Extract Allergy Skin Test    Shrimp [shellfish Allergy] Hives   Sulfa Antibiotics Rash        Medication List     STOP taking these medications    colchicine 0.6 MG tablet   febuxostat 40 MG tablet Commonly known as: ULORIC   lidocaine 5 % Commonly known as: Lidoderm   moxifloxacin 400 MG tablet Commonly known as: AVELOX   rifampin 150 MG capsule Commonly known as: RIFADIN   triamcinolone cream 0.1 % Commonly known as: KENALOG       TAKE these medications    acetaminophen-codeine 300-60 MG tablet Commonly known as: TYLENOL #4 Take 1 tablet by mouth every 4 (four) hours as needed.   ciprofloxacin 500 MG tablet Commonly known as: CIPRO Take 1 tablet (500 mg total) by mouth 2 (two) times daily.   gabapentin 600 MG tablet Commonly known as: NEURONTIN Take 1 tablet (600 mg total) by mouth 3 (three) times daily.   ibuprofen 800 MG tablet Commonly known as: ADVIL TAKE 1 TABLET BY  MOUTH EVERY 8 HOURS AS NEEDED FOR MODERATE PAIN. EAT BEFORE TAKING MEDICATION   metoprolol succinate 25 MG 24 hr tablet Commonly known as: Toprol XL Take 1 tablet (25 mg total) by mouth daily.   metroNIDAZOLE 500 MG tablet Commonly known as: FLAGYL Take 1 tablet (500 mg total) by mouth every 12 (twelve) hours. What changed: when to take this   ondansetron 4 MG tablet Commonly known as: ZOFRAN TAKE 1 TABLET(4 MG) BY MOUTH EVERY 8 HOURS AS NEEDED FOR NAUSEA OR VOMITING   Vitamin D (Ergocalciferol) 1.25 MG (50000 UNIT) Caps capsule Commonly known as:  DRISDOL Take 50,000 Units by mouth once a week.               Discharge Care Instructions  (From admission, onward)           Start     Ordered   05/14/21 0000  Discharge wound care:       Comments: Continue wound care.  Patient is being instructed how to do dressing.   05/14/21 0945            Follow-up Information     Cipriano Mile, NP Follow up in 1 week(s).   Contact information: [place] [address] [phone]         Pichardo-Geisinger, Mila Palmer, MD Follow up in 1 week(s).   Contact information: [place] [address] [place], [place] Follow up.   Why: HH nursing/physical therapy Contact information: [address] [place] [address] [phone]                Allergies  Allergen Reactions   Other Other (See Comments)    All Antibiotics cause severe vaginal yeast infections   Penicillins Hives, Itching, Swelling and Rash    Has patient had a PCN reaction causing immediate rash, facial/tongue/throat swelling, SOB or lightheadedness with hypotension: Yes Has patient had a PCN reaction causing severe rash involving mucus membranes or skin necrosis: Yes Has patient had a PCN reaction that required hospitalization Yes Has patient had a PCN reaction occurring within the last 10 years: Yes If all of the above answers are "NO", then may proceed with Cephalosporin use.  Has patient had a PCN reaction causing immediate rash, facial/tongue/throat swelling, SOB or lightheadedness with hypotension: Yes Has patient had a PCN reaction causing severe rash involving mucus membranes or skin necrosis: Yes Has patient had a PCN reaction that required hospitalization Yes Has patient had a PCN reaction occurring within the last 10 years: Yes If all of the above answers are "NO", then may proceed with Cephalosporin use. Has patient had a PCN reaction causing  immediate rash, facial/tongue/throat swelling, SOB or lightheadedness with hypotension: Yes Has patient had a PCN reaction causing severe rash involving mucus membranes or skin necrosis: Yes Has patient had a PCN reaction that required hospitalization Yes Has patient had a PCN reaction occurring within the last 10 years: Yes If all of the above answers are "NO", then may proceed with Cephalosporin use.   Shellfish-Derived Products Hives   Shrimp Extract Allergy Skin Test    Shrimp [Shellfish Allergy] Hives   Sulfa Antibiotics Rash    Consultations: Infectious disease General surgery   Procedures/Studies: CT Angio Chest Pulmonary Embolism (PE) W or WO Contrast  Result Date: 05/10/2021 CLINICAL DATA:  41 year old female with shortness of breath and fever. EXAM: CT ANGIOGRAPHY  CHEST WITH CONTRAST TECHNIQUE: Multidetector CT imaging of the chest was performed using the standard protocol during bolus administration of intravenous contrast. Multiplanar CT image reconstructions and MIPs were obtained to evaluate the vascular anatomy. RADIATION DOSE REDUCTION: This exam was performed according to the departmental dose-optimization program which includes automated exposure control, adjustment of the mA and/or kV according to patient size and/or use of iterative reconstruction technique. CONTRAST:  30mL OMNIPAQUE IOHEXOL 350 MG/ML SOLN COMPARISON:  05/10/2018, 08/09/2011 CTs and other studies. FINDINGS: Cardiovascular: This study is technically adequate for the evaluation pulmonary emboli. No pulmonary emboli are identified. UPPER limits normal heart size noted. LAD coronary artery atherosclerotic calcifications are noted. There is no evidence of thoracic aortic aneurysm or pericardial effusion. Mediastinum/Nodes: No mediastinal mass noted. UPPER limits normal sized bilateral axillary lymph nodes are unchanged from 2020. No other abnormal appearing lymph nodes are noted. The visualized thyroid gland,  esophagus and trachea are unremarkable. Lungs/Pleura: A 5 mm LEFT LOWER lobe (SUPERIOR segment) nodule (image 43: Series 10) and a 3 mm LEFT UPPER lobe nodule (28:10) appear new since 2020. A 3 mm RIGHT LOWER lobe nodule (image 59: Series 10) is unchanged from 2013. There is no evidence of airspace disease, consolidation, mass, pleural effusion or pneumothorax. Upper Abdomen: No acute abnormality identified. Musculoskeletal: No acute or suspicious bony abnormalities are noted. Review of the MIP images confirms the above findings. IMPRESSION: 1. No evidence of acute abnormality. No evidence of pulmonary emboli. 2. 5 mm LEFT LOWER lobe nodule and 3 mm LEFT LOWER lobe nodule, new since 2020. No follow-up needed if patient is low-risk (and has no known or suspected primary neoplasm). Non-contrast chest CT can be considered in 12 months if patient is high-risk. This recommendation follows the consensus statement: Guidelines for Management of Incidental Pulmonary Nodules Detected on CT Images: From the Fleischner Society 2017; Radiology 2017; 284:228-243. 3. Coronary artery disease. Electronically Signed   By: Margarette Canada M.D.   On: 05/10/2021 12:51   CT ABDOMEN PELVIS W CONTRAST  Result Date: 05/06/2021 CLINICAL DATA:  Abscess. EXAM: CT ABDOMEN AND PELVIS WITH CONTRAST TECHNIQUE: Multidetector CT imaging of the abdomen and pelvis was performed using the standard protocol following bolus administration of intravenous contrast. RADIATION DOSE REDUCTION: This exam was performed according to the departmental dose-optimization program which includes automated exposure control, adjustment of the mA and/or kV according to patient size and/or use of iterative reconstruction technique. CONTRAST:  112mL OMNIPAQUE IOHEXOL 300 MG/ML  SOLN COMPARISON:  August 02, 2012. FINDINGS: Lower chest: No acute abnormality. Hepatobiliary: No focal liver abnormality is seen. No gallstones, gallbladder wall thickening, or biliary dilatation.  Pancreas: Unremarkable. No pancreatic ductal dilatation or surrounding inflammatory changes. Spleen: Normal in size without focal abnormality. Adrenals/Urinary Tract: Adrenal glands are unremarkable. Kidneys are normal, without renal calculi, focal lesion, or hydronephrosis. Bladder is unremarkable. Stomach/Bowel: Stomach is within normal limits. Appendix appears normal. No evidence of bowel wall thickening, distention, or inflammatory changes. Vascular/Lymphatic: No significant vascular findings are present. No enlarged abdominal or pelvic lymph nodes. Reproductive: Uterus and bilateral adnexa are unremarkable. Other: No abdominal wall hernia or abnormality. No abdominopelvic ascites. Musculoskeletal: No acute or significant osseous findings. IMPRESSION: No acute abnormality seen in the abdomen or pelvis. Electronically Signed   By: Marijo Conception M.D.   On: 05/06/2021 12:54    Subjective: Patient was seen and examined at bedside.  Overnight events noted.   Patient reports feeling better, Patient is being discharged home.  Home health  services been arranged.  Discharge Exam: Vitals:   05/14/21 0953 05/14/21 0955  BP: 103/67 103/67  Pulse:  (!) 103  Resp:  18  Temp:  98.9 F (37.2 C)  SpO2:  100%   Vitals:   05/13/21 2124 05/14/21 0623 05/14/21 0953 05/14/21 0955  BP: 104/74 104/63 103/67 103/67  Pulse: 98 (!) 102  (!) 103  Resp: 18 18  18   Temp: 99.3 F (37.4 C) 98.3 F (36.8 C)  98.9 F (37.2 C)  TempSrc: Oral Oral  Oral  SpO2: 95% 100%  100%  Weight:      Height:        General: Pt is alert, awake, not in acute distress Cardiovascular: RRR, S1/S2 +, no rubs, no gallops Respiratory: CTA bilaterally, no wheezing, no rhonchi Abdominal: Soft, NT, ND, bowel sounds + Extremities: no edema, no cyanosis    The results of significant diagnostics from this hospitalization (including imaging, microbiology, ancillary and laboratory) are listed below for reference.      Microbiology: Recent Results (from the past 240 hour(s))  Blood culture (routine x 2)     Status: None   Collection Time: 05/06/21 10:07 AM   Specimen: Right Antecubital; Blood  Result Value Ref Range Status   Specimen Description   Final    RIGHT ANTECUBITAL Performed at [facility], [address] [place], [place] [address]    Special Requests   Final    BOTTLES DRAWN AEROBIC ONLY Blood Culture adequate volume Performed at [facility], [address], [place], [place] [address]    Culture   Final    NO GROWTH 5 DAYS Performed at [facility], [place] [address], [place], [place] [address]    Report Status 05/11/2021 FINAL  Final  Blood culture (routine x 2)     Status: None   Collection Time: 05/06/21 10:07 AM   Specimen: BLOOD LEFT HAND  Result Value Ref Range Status   Specimen Description   Final    BLOOD LEFT HAND Performed at [facility], [address] [place], [place] [address]    Special Requests   Final    BOTTLES DRAWN AEROBIC AND ANAEROBIC Blood Culture adequate volume Performed at [facility], [address] [place], [place] [address]    Culture   Final    NO GROWTH 5 DAYS Performed at [facility], [place] [address], [place], [place] [address]    Report Status 05/11/2021 FINAL  Final  Resp Panel by RT-PCR (Flu A&B, Covid) Nasopharyngeal Swab     Status: None   Collection Time: 05/06/21  2:30 PM   Specimen: Nasopharyngeal Swab; Nasopharyngeal(NP) swabs in vial transport medium  Result Value Ref Range Status   SARS Coronavirus 2 by RT PCR NEGATIVE NEGATIVE Final    Comment: (NOTE) SARS-CoV-2 target nucleic acids are NOT DETECTED.  The SARS-CoV-2 RNA is generally detectable in upper respiratory specimens during the acute phase of infection. The lowest concentration of SARS-CoV-2 viral copies this assay can detect is 138 copies/mL. A negative result does not  preclude SARS-Cov-2 infection and should not be used as the sole basis for treatment or other patient management decisions. A negative result may occur with  improper specimen collection/handling, submission of specimen other than nasopharyngeal swab, presence of viral mutation(s) within the areas targeted by this assay, and inadequate number of viral copies(<138 copies/mL). A negative result must be combined with clinical observations, patient history, and epidemiological information. The expected  result is Negative.  Fact Sheet for Patients:  EntrepreneurPulse.com.au  Fact Sheet for Healthcare Providers:  IncredibleEmployment.be  This test is no t yet approved or cleared by the [place] FDA and  has been authorized for detection and/or diagnosis of SARS-CoV-2 by FDA under an Emergency Use Authorization (EUA). This EUA will remain  in effect (meaning this test can be used) for the duration of the COVID-19 declaration under Section 564(b)(1) of the Act, 21 U.S.C.section 360bbb-3(b)(1), unless the authorization is terminated  or revoked sooner.       Influenza A by PCR NEGATIVE NEGATIVE Final   Influenza B by PCR NEGATIVE NEGATIVE Final    Comment: (NOTE) The Xpert Xpress SARS-CoV-2/FLU/RSV plus assay is intended as an aid in the diagnosis of influenza from Nasopharyngeal swab specimens and should not be used as a sole basis for treatment. Nasal washings and aspirates are unacceptable for Xpert Xpress SARS-CoV-2/FLU/RSV testing.  Fact Sheet for Patients: EntrepreneurPulse.com.au  Fact Sheet for Healthcare Providers: IncredibleEmployment.be  This test is not yet approved or cleared by the [place] FDA and has been authorized for detection and/or diagnosis of SARS-CoV-2 by FDA under an Emergency Use Authorization (EUA). This EUA will remain in effect (meaning this test can be used) for the  duration of the COVID-19 declaration under Section 564(b)(1) of the Act, 21 U.S.C. section 360bbb-3(b)(1), unless the authorization is terminated or revoked.  Performed at [facility], [address] [place], [place] [address]      Labs: BNP (last 3 results) No results for input(s): BNP in the last 8760 hours. Basic Metabolic Panel: Recent Labs  Lab 05/08/21 0513 05/09/21 0431 05/12/21 0431  NA 134* 133* 137  K 3.3* 3.6 3.8  CL 107 104 107  CO2 21* 22 24  GLUCOSE 98 96 96  BUN 11 14 10   CREATININE 0.64 0.66 0.58  CALCIUM 8.4* 8.4* 8.4*  MG 1.8  --   --   PHOS 4.5  --   --    Liver Function Tests: No results for input(s): AST, ALT, ALKPHOS, BILITOT, PROT, ALBUMIN in the last 168 hours. No results for input(s): LIPASE, AMYLASE in the last 168 hours. No results for input(s): AMMONIA in the last 168 hours. CBC: Recent Labs  Lab 05/08/21 0513 05/09/21 0431 05/10/21 0552 05/11/21 0526 05/12/21 0431 05/12/21 1907 05/13/21 0506 05/13/21 1101  WBC 11.3* 13.6*  --   --  9.2  --   --   --   HGB 7.7* 8.4*   < > 8.2* 7.8* 9.3* 9.4* 9.4*  HCT 25.4* 28.1*   < > 27.9* 25.8* 30.2* 30.5* 30.4*  MCV 80.4 81.0  --   --  80.6  --   --   --   PLT 576* 651*  --   --  613*  --   --   --    < > = values in this interval not displayed.   Cardiac Enzymes: No results for input(s): CKTOTAL, CKMB, CKMBINDEX, TROPONINI in the last 168 hours. BNP: Invalid input(s): POCBNP CBG: No results for input(s): GLUCAP in the last 168 hours. D-Dimer No results for input(s): DDIMER in the last 72 hours. Hgb A1c No results for input(s): HGBA1C in the last 72 hours. Lipid Profile No results for input(s): CHOL, HDL, LDLCALC, TRIG, CHOLHDL, LDLDIRECT in the last 72 hours. Thyroid function studies No results for input(s): TSH, T4TOTAL, T3FREE, THYROIDAB in the last 72 hours.  Invalid input(s): FREET3 Anemia work up No  results for input(s): VITAMINB12, FOLATE, FERRITIN, TIBC,  IRON, RETICCTPCT in the last 72 hours. Urinalysis    Component Value Date/Time   COLORURINE YELLOW 02/23/2019 0446   APPEARANCEUR CLEAR 02/23/2019 0446   LABSPEC 1.016 02/23/2019 0446   PHURINE 5.0 02/23/2019 0446   GLUCOSEU NEGATIVE 02/23/2019 0446   HGBUR NEGATIVE 02/23/2019 0446   BILIRUBINUR small 03/14/2019 0846   KETONESUR NEGATIVE 02/23/2019 0446   PROTEINUR Positive (A) 03/14/2019 0846   PROTEINUR NEGATIVE 02/23/2019 0446   UROBILINOGEN 0.2 03/14/2019 0846   UROBILINOGEN 0.2 06/03/2017 1416   NITRITE Negative 03/14/2019 0846   NITRITE NEGATIVE 02/23/2019 0446   LEUKOCYTESUR Negative 03/14/2019 0846   LEUKOCYTESUR MODERATE (A) 02/23/2019 0446   Sepsis Labs Invalid input(s): PROCALCITONIN,  WBC,  LACTICIDVEN Microbiology Recent Results (from the past 240 hour(s))  Blood culture (routine x 2)     Status: None   Collection Time: 05/06/21 10:07 AM   Specimen: Right Antecubital; Blood  Result Value Ref Range Status   Specimen Description   Final    RIGHT ANTECUBITAL Performed at [facility], [address] [place], [place] [address]    Special Requests   Final    BOTTLES DRAWN AEROBIC ONLY Blood Culture adequate volume Performed at [facility], [address], [place], [place] [address]    Culture   Final    NO GROWTH 5 DAYS Performed at [facility], [place] [address] [place], [place] [address]    Report Status 05/11/2021 FINAL  Final  Blood culture (routine x 2)     Status: None   Collection Time: 05/06/21 10:07 AM   Specimen: BLOOD LEFT HAND  Result Value Ref Range Status   Specimen Description   Final    BLOOD LEFT HAND Performed at [facility], [address], [place], [place] [address]    Special Requests   Final    BOTTLES DRAWN AEROBIC AND ANAEROBIC Blood Culture adequate volume Performed at [facility], [address], [place], [place] [address]    Culture   Final     NO GROWTH 5 DAYS Performed at [facility], [place] [address], [place],  [address]    Report Status 05/11/2021 FINAL  Final  Resp Panel by RT-PCR (Flu A&B, Covid) Nasopharyngeal Swab     Status: None   Collection Time: 05/06/21  2:30 PM   Specimen: Nasopharyngeal Swab; Nasopharyngeal(NP) swabs in vial transport medium  Result Value Ref Range Status   SARS Coronavirus 2 by RT PCR NEGATIVE NEGATIVE Final    Comment: (NOTE) SARS-CoV-2 target nucleic acids are NOT DETECTED.  The SARS-CoV-2 RNA is generally detectable in upper respiratory specimens during the acute phase of infection. The lowest concentration of SARS-CoV-2 viral copies this assay can detect is 138 copies/mL. A negative result does not preclude SARS-Cov-2 infection and should not be used as the sole basis for treatment or other patient management decisions. A negative result may occur with  improper specimen collection/handling, submission of specimen other than nasopharyngeal swab, presence of viral mutation(s) within the areas targeted by this assay, and inadequate number of viral copies(<138 copies/mL). A negative result must be combined with clinical observations, patient history, and epidemiological information. The expected result is Negative.  Fact Sheet for Patients:  EntrepreneurPulse.com.au  Fact Sheet for Healthcare Providers:  IncredibleEmployment.be  This test is no t yet approved or cleared by the [place] FDA and  has been authorized for detection and/or diagnosis of SARS-CoV-2  by FDA under an Emergency Use Authorization (EUA). This EUA will remain  in effect (meaning this test can be used) for the duration of the COVID-19 declaration under Section 564(b)(1) of the Act, 21 U.S.C.section 360bbb-3(b)(1), unless the authorization is terminated  or revoked sooner.       Influenza A by PCR NEGATIVE NEGATIVE Final   Influenza B by PCR NEGATIVE NEGATIVE  Final    Comment: (NOTE) The Xpert Xpress SARS-CoV-2/FLU/RSV plus assay is intended as an aid in the diagnosis of influenza from Nasopharyngeal swab specimens and should not be used as a sole basis for treatment. Nasal washings and aspirates are unacceptable for Xpert Xpress SARS-CoV-2/FLU/RSV testing.  Fact Sheet for Patients: EntrepreneurPulse.com.au  Fact Sheet for Healthcare Providers: IncredibleEmployment.be  This test is not yet approved or cleared by the [place] FDA and has been authorized for detection and/or diagnosis of SARS-CoV-2 by FDA under an Emergency Use Authorization (EUA). This EUA will remain in effect (meaning this test can be used) for the duration of the COVID-19 declaration under Section 564(b)(1) of the Act, 21 U.S.C. section 360bbb-3(b)(1), unless the authorization is terminated or revoked.  Performed at [facility], [address] [place], [place] [address]      Time coordinating discharge: Over 30 minutes  SIGNED:   Shawna Clamp, MD  [facility] 05/14/2021, 2:57 PM Pager   If 7PM-7AM, please contact night-coverage

## 2021-05-16 LAB — TYPE AND SCREEN
ABO/RH(D): O POS
Antibody Screen: NEGATIVE
Unit division: 0
Unit division: 0

## 2021-05-16 LAB — BPAM RBC
Blood Product Expiration Date: 202303082359
Blood Product Expiration Date: 202303112359
ISSUE DATE / TIME: 202302061317
Unit Type and Rh: 5100
Unit Type and Rh: 5100

## 2021-06-06 ENCOUNTER — Ambulatory Visit: Payer: Medicare (Managed Care) | Admitting: Internal Medicine

## 2021-06-06 ENCOUNTER — Encounter: Payer: Self-pay | Admitting: Hematology

## 2021-06-30 ENCOUNTER — Other Ambulatory Visit: Payer: Self-pay | Admitting: Student

## 2021-06-30 DIAGNOSIS — Z1231 Encounter for screening mammogram for malignant neoplasm of breast: Secondary | ICD-10-CM

## 2021-07-24 ENCOUNTER — Other Ambulatory Visit: Payer: Self-pay

## 2021-07-24 DIAGNOSIS — D509 Iron deficiency anemia, unspecified: Secondary | ICD-10-CM

## 2021-07-28 ENCOUNTER — Inpatient Hospital Stay: Payer: Medicare (Managed Care)

## 2021-07-28 ENCOUNTER — Inpatient Hospital Stay: Payer: Medicare (Managed Care) | Attending: Hematology | Admitting: Hematology

## 2021-07-28 VITALS — BP 109/64 | HR 88 | Temp 97.5°F | Resp 20 | Wt 272.7 lb

## 2021-07-28 DIAGNOSIS — D75838 Other thrombocytosis: Secondary | ICD-10-CM | POA: Insufficient documentation

## 2021-07-28 DIAGNOSIS — D509 Iron deficiency anemia, unspecified: Secondary | ICD-10-CM | POA: Insufficient documentation

## 2021-07-28 DIAGNOSIS — D75839 Thrombocytosis, unspecified: Secondary | ICD-10-CM

## 2021-07-28 DIAGNOSIS — Z87891 Personal history of nicotine dependence: Secondary | ICD-10-CM | POA: Diagnosis not present

## 2021-07-28 DIAGNOSIS — N920 Excessive and frequent menstruation with regular cycle: Secondary | ICD-10-CM | POA: Diagnosis not present

## 2021-07-28 DIAGNOSIS — M109 Gout, unspecified: Secondary | ICD-10-CM | POA: Diagnosis not present

## 2021-07-28 DIAGNOSIS — E538 Deficiency of other specified B group vitamins: Secondary | ICD-10-CM | POA: Insufficient documentation

## 2021-07-28 DIAGNOSIS — Z79899 Other long term (current) drug therapy: Secondary | ICD-10-CM | POA: Insufficient documentation

## 2021-07-28 LAB — CBC WITH DIFFERENTIAL (CANCER CENTER ONLY)
Abs Immature Granulocytes: 0.03 10*3/uL (ref 0.00–0.07)
Basophils Absolute: 0.1 10*3/uL (ref 0.0–0.1)
Basophils Relative: 1 %
Eosinophils Absolute: 0.1 10*3/uL (ref 0.0–0.5)
Eosinophils Relative: 1 %
HCT: 30.8 % — ABNORMAL LOW (ref 36.0–46.0)
Hemoglobin: 9.4 g/dL — ABNORMAL LOW (ref 12.0–15.0)
Immature Granulocytes: 0 %
Lymphocytes Relative: 26 %
Lymphs Abs: 3.2 10*3/uL (ref 0.7–4.0)
MCH: 22.4 pg — ABNORMAL LOW (ref 26.0–34.0)
MCHC: 30.5 g/dL (ref 30.0–36.0)
MCV: 73.5 fL — ABNORMAL LOW (ref 80.0–100.0)
Monocytes Absolute: 0.7 10*3/uL (ref 0.1–1.0)
Monocytes Relative: 6 %
Neutro Abs: 8.4 10*3/uL — ABNORMAL HIGH (ref 1.7–7.7)
Neutrophils Relative %: 66 %
Platelet Count: 620 10*3/uL — ABNORMAL HIGH (ref 150–400)
RBC: 4.19 MIL/uL (ref 3.87–5.11)
RDW: 17.4 % — ABNORMAL HIGH (ref 11.5–15.5)
WBC Count: 12.5 10*3/uL — ABNORMAL HIGH (ref 4.0–10.5)
nRBC: 0 % (ref 0.0–0.2)

## 2021-07-28 LAB — IRON AND IRON BINDING CAPACITY (CC-WL,HP ONLY)
Iron: 17 ug/dL — ABNORMAL LOW (ref 28–170)
Saturation Ratios: 5 % — ABNORMAL LOW (ref 10.4–31.8)
TIBC: 332 ug/dL (ref 250–450)
UIBC: 315 ug/dL (ref 148–442)

## 2021-07-28 LAB — FERRITIN: Ferritin: 20 ng/mL (ref 11–307)

## 2021-07-28 LAB — VITAMIN B12: Vitamin B-12: 225 pg/mL (ref 180–914)

## 2021-07-28 LAB — CMP (CANCER CENTER ONLY)
ALT: 7 U/L (ref 0–44)
AST: 9 U/L — ABNORMAL LOW (ref 15–41)
Albumin: 3.5 g/dL (ref 3.5–5.0)
Alkaline Phosphatase: 65 U/L (ref 38–126)
Anion gap: 7 (ref 5–15)
BUN: 14 mg/dL (ref 6–20)
CO2: 23 mmol/L (ref 22–32)
Calcium: 8.8 mg/dL — ABNORMAL LOW (ref 8.9–10.3)
Chloride: 106 mmol/L (ref 98–111)
Creatinine: 0.85 mg/dL (ref 0.44–1.00)
GFR, Estimated: >60 mL/min (ref >60)
Glucose, Bld: 129 mg/dL — ABNORMAL HIGH (ref 70–99)
Potassium: 3.4 mmol/L — ABNORMAL LOW (ref 3.5–5.1)
Sodium: 136 mmol/L (ref 135–145)
Total Bilirubin: 0.3 mg/dL (ref 0.3–1.2)
Total Protein: 8.7 g/dL — ABNORMAL HIGH (ref 6.5–8.1)

## 2021-07-28 NOTE — Progress Notes (Signed)
? ? ? ? ? ? ? ?HEMATOLOGY/ONCOLOGY CLINIC NOTE ? ?Date of Service: 07/28/2021 ? ? ?PCP [facility] ?Rheumatology (Gout management) Gennette Pac MD ?Cardiology - [facility] ? ?CHIEF COMPLAINTS/PURPOSE OF CONSULTATION:  ?For continued management of iron deficiency anemia with reactive thrombocytosis ? ?HISTORY OF PRESENTING ILLNESS:  ?Amanda Davenport is a wonderful 41 y.o. female who has been referred to [place] for evaluation and management of thrombocytosis.  She was last seen by [place] in 2016 and was lost to follow-up due to issues with her medical insurance. ? ?Patient has been referred back to [place] for evaluation of her intermittent thrombocytosis and severe iron deficiency anemia. ?The pt reports her hidradenitis suppurativa has been quite bothersome and she had surgery on 4/23-- left HS excision--still has an open surgical wound with some oozing .  Had blood loss during surgery as well. ? ?Patient notes she has had significant issues with severe gout attacks- prednisone, allopurinol/colchicine/febostat.  She is following with Dr.Khiem Gardiner Sleeper MD for her rheumatology cares. ? ?She continues to have heavy menstrual periods lasting 5 days of which 4 are very heavy. ?Has been taking Geritol  1 month and also has continued taking prenatal vitamins ?No personal history of VTE ?No FHx of VTE, bleeding or clotting disorders but mother had PE in the setting of surgery and active breast cancer. ?Quit smoking tobacco- 07/2019. 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 ?Most recent lab results (01/19/2020) of CBC is as follows: all values are WNL except for Hgb at 9.1, HCT at 29.9, MCV at 65.1, MCH at 19.8, MCHC at 30.5, RDW at 20.2, MPV at 6.6, PLT at 784K, CO2 at 20. ? ?On review of systems, pt reports severe fatigue and some ice cravings.  No fevers chills night sweats or new bone pains. ? ?INTERVAL HISTORY: ? ?Amanda Davenport is here for continued  evaluation and management of her iron deficiency anemia and reactive thrombocytosis. ?She continues to follow-up with rheumatology and dermatology for her severe hidradenitis suppurativa.  She notes she was on treatment with Humira and developed sepsis leading to hospitalization and need for IV antibiotics. ?She notes that she has a wound care nurse coming to her house twice a day to address her significant wounds from her hidradenitis suppurativa. ?She continues to follow with  Rheumatology- Dr Laurell Josephs and Dermatology - Mamie Levers. ?She was last seen by [place] in hematology about 1 year ago. ?Labs done today were reviewed with the patient in detail and show microcytic anemia with a hemoglobin of 9.4 with reactive leukocytosis of 12.5 and reactive thrombocytosis with platelets of 620k ?Vitamin B12 of 225 ?Ferritin of 20 with an iron saturation of 5% ?CMP stable other than potassium of 3.4. ?We discussed consideration for additional IV iron and patient is agreeable to proceed. ? ? ? ?MEDICAL HISTORY:  ?Past Medical History:  ?Diagnosis Date  ? Allergy   ? Anemia   ? receives transfusions periodically  ? Anxiety   ? Arrhythmia   ? Chronic headache   ? Depression   ? Dysrhythmia   ? Gout 12/2018  ? Knee pain   ? Nearsightedness   ? wears glasses  ? Neuropathy   ? Obesity   ? Pneumonia   ? Pre-diabetes   ? Recurrent boils   ? WPW (Wolff-Parkinson-White syndrome)   ? ? ?SURGICAL HISTORY: ?Past Surgical History:  ?Procedure Laterality Date  ? ADJACENT TISSUE  TRANSFER/TISSUE REARRANGEMENT Left 07/28/2019  ? Procedure: ADJACENT TISSUE TRANSFER TO LEFT AXILLA GREATER THAN 100 CM SQUARED;  Surgeon: Irene Limbo, MD;  Location: [facility];  Service: Plastics;  Laterality: Left;  ? HYDRADENITIS EXCISION Left 07/28/2019  ? Procedure: EXCISION LEFT HIDRADENITIS AXILLA;  Surgeon: Clovis Riley, MD;  Location: [facility];  Service: General;  Laterality: Left;  ? TONSILLECTOMY    ? ? ?SOCIAL HISTORY: ?Social History   ? ?Socioeconomic History  ? Marital status: Single  ?  Spouse name: Not on file  ? Number of children: 0  ? Years of education: Not on file  ? Highest education level: Not on file  ?Occupational History  ? Occupation: disabled  ?Tobacco Use  ? Smoking status: Former  ?  Years: 0.50  ?  Types: Cigarettes, Cigars  ?  Quit date: 04/30/2019  ?  Years since quitting: 2.2  ? Smokeless tobacco: Never  ? Tobacco comments:  ?  smokes black and milds - last use early-mid August  ?Vaping Use  ? Vaping Use: Never used  ?Substance and Sexual Activity  ? Alcohol use: No  ? Drug use: Yes  ?  Types: Marijuana  ?  Comment: 2+ times per month  ? Sexual activity: Yes  ?  Partners: Male  ?  Birth control/protection: Condom  ?Other Topics Concern  ? Not on file  ?Social History Narrative  ? Not on file  ? ?Social Determinants of Health  ? ?Financial Resource Strain: Low Risk   ? Difficulty of Paying Living Expenses: Not hard at all  ?Food Insecurity: No Food Insecurity  ? Worried About Charity fundraiser in the Last Year: Never true  ? Ran Out of Food in the Last Year: Never true  ?Transportation Needs: No Transportation Needs  ? Lack of Transportation (Medical): No  ? Lack of Transportation (Non-Medical): No  ?Physical Activity: Inactive  ? Days of Exercise per Week: 0 days  ? Minutes of Exercise per Session: 0 min  ?Stress: Stress Concern Present  ? Feeling of Stress : To some extent  ?Social Connections: Socially Isolated  ? Frequency of Communication with Friends and Family: More than three times a week  ? Frequency of Social Gatherings with Friends and Family: Never  ? Attends Religious Services: Never  ? Active Member of Clubs or Organizations: No  ? Attends Archivist Meetings: Never  ? Marital Status: Never married  ?Intimate Partner Violence: Not At Risk  ? Fear of Current or Ex-Partner: No  ? Emotionally Abused: No  ? Physically Abused: No  ? Sexually Abused: No  ? ? ?FAMILY HISTORY: ?Family History  ?Problem  Relation Age of Onset  ? Breast cancer Mother   ? Pulmonary embolism Mother   ?     died of PE  ? Colon cancer Mother   ? Irritable bowel syndrome Mother   ? Cancer Mother   ? Hypertension Father   ? Diabetes Paternal Grandmother   ? Heart disease Neg Hx   ? Stroke Neg Hx   ? ? ?ALLERGIES:  is allergic to other, penicillins, shellfish-derived products, shrimp extract allergy skin test, shrimp [shellfish allergy], and sulfa antibiotics. ? ?MEDICATIONS:  ?Current Outpatient Medications  ?Medication Sig Dispense Refill  ? acetaminophen-codeine (TYLENOL #4) 300-60 MG tablet Take 1 tablet by mouth every 4 (four) hours as needed. 15 tablet 0  ? ciprofloxacin (CIPRO) 500 MG tablet Take 1 tablet (500 mg total) by mouth 2 (two) times daily.  30 tablet 0  ? gabapentin (NEURONTIN) 600 MG tablet Take 1 tablet (600 mg total) by mouth 3 (three) times daily. 90 tablet 11  ? ibuprofen (ADVIL) 800 MG tablet TAKE 1 TABLET BY MOUTH EVERY 8 HOURS AS NEEDED FOR MODERATE PAIN. EAT BEFORE TAKING MEDICATION 60 tablet 0  ? metoprolol succinate (TOPROL XL) 25 MG 24 hr tablet Take 1 tablet (25 mg total) by mouth daily. 90 tablet 3  ? metroNIDAZOLE (FLAGYL) 500 MG tablet Take 1 tablet (500 mg total) by mouth every 12 (twelve) hours. 45 tablet 0  ? ondansetron (ZOFRAN) 4 MG tablet TAKE 1 TABLET(4 MG) BY MOUTH EVERY 8 HOURS AS NEEDED FOR NAUSEA OR VOMITING 20 tablet 0  ? Vitamin D, Ergocalciferol, (DRISDOL) 1.25 MG (50000 UNIT) CAPS capsule Take 50,000 Units by mouth once a week.    ? ?No current facility-administered medications for this visit.  ? ? ?REVIEW OF SYSTEMS:   ?10 Point review of Systems was done is negative except as noted above. ?PHYSICAL EXAMINATION: ?ECOG PERFORMANCE STATUS: 2 - Symptomatic, <50% confined to bed ? ?. ?Vitals:  ? 07/28/21 1535  ?BP: 109/64  ?Pulse: 88  ?Resp: 20  ?Temp: (!) 97.5 ?F (36.4 ?C)  ?SpO2: 100%  ? ?Filed Weights  ? 07/28/21 1535  ?Weight: 272 lb 11.2 oz (123.7 kg)  ? ?.Body mass index is 39.13  kg/m?. ? ?NAD ?GENERAL:alert, in no acute distress and comfortable ?EYES: conjunctiva are pink and non-injected, sclera anicteric ?OROPHARYNX: MMM, no exudates, no oropharyngeal erythema or ulceration ?NECK: supple, no JVD ?LYMPH:

## 2021-07-29 ENCOUNTER — Ambulatory Visit
Admission: RE | Admit: 2021-07-29 | Discharge: 2021-07-29 | Disposition: A | Discharge status: Home / Self Care | Payer: Medicare (Managed Care) | Source: Ambulatory Visit | Attending: Student | Admitting: Student

## 2021-07-29 ENCOUNTER — Encounter: Payer: Self-pay | Admitting: Hematology

## 2021-07-29 DIAGNOSIS — Z1231 Encounter for screening mammogram for malignant neoplasm of breast: Secondary | ICD-10-CM

## 2021-07-31 ENCOUNTER — Encounter: Payer: Self-pay | Admitting: Hematology

## 2021-07-31 MED ORDER — B-12 1000 MCG SL SUBL: 2000.0000 ug | SUBLINGUAL_TABLET | Freq: Every day | SUBLINGUAL | 11 refills | Status: DC

## 2021-08-04 ENCOUNTER — Telehealth: Payer: Self-pay | Admitting: Hematology

## 2021-08-04 NOTE — Telephone Encounter (Signed)
Scheduled follow-up appointments per 4/24 los. Patient is aware. ?

## 2021-08-08 ENCOUNTER — Other Ambulatory Visit: Payer: Self-pay | Admitting: Hematology

## 2021-08-11 ENCOUNTER — Inpatient Hospital Stay: Payer: Medicare (Managed Care) | Attending: Hematology

## 2021-08-11 ENCOUNTER — Other Ambulatory Visit: Payer: Self-pay

## 2021-08-11 VITALS — BP 122/68 | HR 108 | Temp 98.2°F | Resp 18

## 2021-08-11 DIAGNOSIS — D508 Other iron deficiency anemias: Secondary | ICD-10-CM

## 2021-08-11 DIAGNOSIS — D509 Iron deficiency anemia, unspecified: Secondary | ICD-10-CM | POA: Insufficient documentation

## 2021-08-11 MED ORDER — LORATADINE 10 MG PO TABS
10.0000 mg | ORAL_TABLET | Freq: Once | ORAL | Status: AC
  Administered 2021-08-11: 10 mg via ORAL
  Filled 2021-08-11: qty 1

## 2021-08-11 MED ORDER — ACETAMINOPHEN 325 MG PO TABS
650.0000 mg | ORAL_TABLET | Freq: Once | ORAL | Status: AC
  Administered 2021-08-11: 650 mg via ORAL
  Filled 2021-08-11: qty 2

## 2021-08-11 MED ORDER — SODIUM CHLORIDE 0.9 % IV SOLN
300.0000 mg | Freq: Once | INTRAVENOUS | Status: AC
  Administered 2021-08-11: 300 mg via INTRAVENOUS
  Filled 2021-08-11: qty 300

## 2021-08-11 MED ORDER — SODIUM CHLORIDE 0.9 % IV SOLN: Freq: Once | INTRAVENOUS | Status: AC

## 2021-08-11 NOTE — Patient Instructions (Signed)

## 2021-08-11 NOTE — Progress Notes (Signed)
Patient tolerated therapy well.  Declined to stay for 27mnutes ?

## 2021-08-18 ENCOUNTER — Inpatient Hospital Stay: Payer: Medicare (Managed Care)

## 2021-08-18 ENCOUNTER — Other Ambulatory Visit: Payer: Self-pay

## 2021-08-18 VITALS — BP 112/72 | HR 98 | Temp 98.0°F | Resp 18

## 2021-08-18 DIAGNOSIS — D509 Iron deficiency anemia, unspecified: Secondary | ICD-10-CM | POA: Diagnosis not present

## 2021-08-18 DIAGNOSIS — D508 Other iron deficiency anemias: Secondary | ICD-10-CM

## 2021-08-18 MED ORDER — ACETAMINOPHEN 325 MG PO TABS
650.0000 mg | ORAL_TABLET | Freq: Once | ORAL | Status: AC
  Administered 2021-08-18: 650 mg via ORAL
  Filled 2021-08-18: qty 2

## 2021-08-18 MED ORDER — SODIUM CHLORIDE 0.9 % IV SOLN
300.0000 mg | Freq: Once | INTRAVENOUS | Status: AC
  Administered 2021-08-18: 300 mg via INTRAVENOUS
  Filled 2021-08-18: qty 300

## 2021-08-18 MED ORDER — SODIUM CHLORIDE 0.9 % IV SOLN: Freq: Once | INTRAVENOUS | Status: AC

## 2021-08-18 MED ORDER — LORATADINE 10 MG PO TABS
10.0000 mg | ORAL_TABLET | Freq: Once | ORAL | Status: AC
  Administered 2021-08-18: 10 mg via ORAL
  Filled 2021-08-18: qty 1

## 2021-08-18 NOTE — Progress Notes (Signed)
Pt declined to stay for 30 minutes post Venofer infusion obs. ?Pt tolerated trtmt well w/out incident. ?VSS at discharge.  ?Ambulatory to lobby.   ?

## 2021-08-18 NOTE — Patient Instructions (Signed)

## 2021-08-25 ENCOUNTER — Inpatient Hospital Stay: Payer: Medicare (Managed Care)

## 2021-08-25 ENCOUNTER — Other Ambulatory Visit: Payer: Self-pay

## 2021-08-25 VITALS — BP 101/67 | HR 83 | Temp 98.3°F | Resp 18 | Ht 69.0 in | Wt 276.0 lb

## 2021-08-25 DIAGNOSIS — D508 Other iron deficiency anemias: Secondary | ICD-10-CM

## 2021-08-25 DIAGNOSIS — D509 Iron deficiency anemia, unspecified: Secondary | ICD-10-CM | POA: Diagnosis not present

## 2021-08-25 MED ORDER — SODIUM CHLORIDE 0.9 % IV SOLN: Freq: Once | INTRAVENOUS | Status: AC

## 2021-08-25 MED ORDER — LORATADINE 10 MG PO TABS
10.0000 mg | ORAL_TABLET | Freq: Once | ORAL | Status: AC
  Administered 2021-08-25: 10 mg via ORAL
  Filled 2021-08-25: qty 1

## 2021-08-25 MED ORDER — ACETAMINOPHEN 325 MG PO TABS
650.0000 mg | ORAL_TABLET | Freq: Once | ORAL | Status: AC
  Administered 2021-08-25: 650 mg via ORAL
  Filled 2021-08-25: qty 2

## 2021-08-25 MED ORDER — SODIUM CHLORIDE 0.9 % IV SOLN
300.0000 mg | Freq: Once | INTRAVENOUS | Status: AC
  Administered 2021-08-25: 300 mg via INTRAVENOUS
  Filled 2021-08-25: qty 300

## 2021-08-25 NOTE — Progress Notes (Signed)
Pt. declines to stay for full 30 minute post observation, states she has been tolerating treatment well. Vital signs stable. Left via ambulation, no respiratory distress noted.

## 2021-08-25 NOTE — Patient Instructions (Signed)

## 2021-10-16 ENCOUNTER — Ambulatory Visit: Payer: Self-pay | Admitting: Surgery

## 2021-10-16 NOTE — H&P (Signed)
Amanda Davenport [id number]    Referring Provider:  Self     Subjective    Chief Complaint: hidranitisis       History of Present Illness:    Very pleasant 41 year old woman known to me following excision of left axillary hidradenitis with rotational flap closure by Dr. Aldona Bar in 2021.  This has healed and has remained asymptomatic.  In the interim, she did follow-up with [facility] dermatology and tried Humira, subsequently developed a flare in her right axilla, under her breast, and in the perineum/buttock region that put her in the hospital for several days with sepsis.  She is considering Remicade but is hesitant to try any further immunotherapy based on that initial experience.  She has had ongoing pain and drainage in the right axilla and is interested in having this side excised.     Review of Systems: A complete review of systems was obtained from the patient.  I have reviewed this information and discussed as appropriate with the patient.  See HPI as well for other ROS.     Medical History: Past Medical History      Past Medical History:  Diagnosis Date   Anemia     Anxiety     Arthritis     Asthma, unspecified asthma severity, unspecified whether complicated, unspecified whether persistent          There is no problem list on file for this patient.     Past Surgical History       Past Surgical History:  Procedure Laterality Date   axillary surgery            Allergies       Allergies  Allergen Reactions   Other Other (See Comments)      All Antibiotics cause severe vaginal yeast infections   Penicillins Hives, Itching, Rash and Swelling      Has patient had a PCN reaction causing immediate rash, facial/tongue/throat swelling, SOB or lightheadedness with hypotension: Yes  Has patient had a PCN reaction causing severe rash involving mucus membranes or skin necrosis: Yes  Has patient had a PCN reaction that required hospitalization Yes  Has patient had a PCN  reaction occurring within the last 10 years: Yes  If all of the above answers are "NO", then may proceed with Cephalosporin use.  Has patient had a PCN reaction causing immediate rash, facial/tongue/throat swelling, SOB or lightheadedness with hypotension: Yes  Has patient had a PCN reaction causing severe rash involving mucus membranes or skin necrosis: Yes  Has patient had a PCN reaction that required hospitalization Yes  Has patient had a PCN reaction occurring within the last 10 years: Yes  If all of the above answers are "NO", then may proceed with Cephalosporin use.   Has patient had a PCN reaction causing immediate rash, facial/tongue/throat swelling, SOB or lightheadedness with hypotension: Yes  Has patient had a PCN reaction causing severe rash involving mucus membranes or skin necrosis: Yes  Has patient had a PCN reaction that required hospitalization Yes  Has patient had a PCN reaction occurring within the last 10 years: Yes  If all of the above answers are "NO", then may proceed with Cephalosporin use.    Has patient had a PCN reaction causing immediate rash, facial/tongue/throat swelling, SOB or lightheadedness with hypotension: Yes  Has patient had a PCN reaction causing severe rash involving mucus membranes or skin necrosis: Yes  Has patient had a PCN reaction that required hospitalization Yes  Has  patient had a PCN reaction occurring within the last 10 years: Yes  If all of the above answers are "NO", then may proceed with Cephalosporin use.  Has patient had a PCN reaction causing immediate rash, facial/tongue/throat swelling, SOB or lightheadedness with hypotension: Yes  Has patient had a PCN reaction causing severe rash involving mucus membranes or skin necrosis: Yes  Has patient had a PCN reaction that required hospitalization Yes  Has patient had a PCN reaction occurring within the last 10 years: Yes  If all of the above answers are "NO", then may proceed with Cephalosporin  use.   Shellfish Containing Products Hives   Sulfa (Sulfonamide Antibiotics) Rash              Current Outpatient Medications on File Prior to Visit  Medication Sig Dispense Refill   acetaminophen-codeine (TYLENOL #4) 300-60 mg per tablet TAKE 1 TABLET BY MOUTH EVERY 4 HOURS FOR 10 DAYS AS NEEDED       ergocalciferol, vitamin D2, 1,250 mcg (50,000 unit) capsule Take 1 capsule by mouth every 7 (seven) days       ibuprofen (MOTRIN) 800 MG tablet TAKE 1 TABLET BY MOUTH EVERY 8 HOURS AS NEEDED FOR MODERATE PAIN. EAT BEFORE TAKING MEDICATION       traZODone (DESYREL) 50 MG tablet TAKE 1 TO 2 TABLETS BY MOUTH DAILY AT BEDTIME AS NEEDED FOR SLEEP       clindamycin (CLEOCIN) 150 MG capsule         colchicine (COLCRYS) 0.6 mg tablet Take 1 tablet by mouth once daily       Febuxostat (ULORIC) 40 mg tablet          No current facility-administered medications on file prior to visit.      Family History       Family History  Problem Relation Age of Onset   Stroke Mother     Colon cancer Mother     Breast cancer Mother     High blood pressure (Hypertension) Father     Hyperlipidemia (Elevated cholesterol) Father          Social History        Tobacco Use  Smoking Status Former   Types: Cigarettes  Smokeless Tobacco Never      Social History  Social History         Socioeconomic History   Marital status: Single  Tobacco Use   Smoking status: Former      Types: Cigarettes   Smokeless tobacco: Never  Vaping Use   Vaping Use: Former  Substance and Sexual Activity   Alcohol use: Never   Drug use: Yes        Objective:         Vitals:    10/16/21 1027  BP: 118/78  Pulse: (!) 134  Temp: 36.3 C (97.3 F)  SpO2: 97%  Weight: (!) 123.8 kg (273 lb)  Height: 177.8 cm ('5\' 10"'$ )    Body mass index is 39.17 kg/m.   Alert well-appearing Unlabored respirations End-stage hidradenitis occupying the entirety of her right axilla with multiple areas of chronic drainage and  scar Left axilla is well-healed with expected scar changes   Assessment and Plan:  Diagnoses and all orders for this visit:   Hidradenitis axillaris -     Ambulatory referral to Plastic Surgery     We will plan to proceed with excision of the right side, again will consult Dr. Iran Planas for flap coverage and hopefully we  will be able to do a joint case.  Patient aware of risks of surgery including pain, bleeding, infection, wound healing problems, recurrent disease, etc.  Questions welcomed and answered to her satisfaction.     Jillien Yakel Raquel James, MD

## 2021-10-31 ENCOUNTER — Inpatient Hospital Stay (HOSPITAL_BASED_OUTPATIENT_CLINIC_OR_DEPARTMENT_OTHER)
Admission: EM | Admit: 2021-10-31 | Discharge: 2021-11-05 | DRG: 607 | Disposition: A | Discharge status: Home / Self Care | Payer: Medicare (Managed Care) | Attending: Internal Medicine | Admitting: Internal Medicine

## 2021-10-31 ENCOUNTER — Encounter (HOSPITAL_BASED_OUTPATIENT_CLINIC_OR_DEPARTMENT_OTHER): Payer: Self-pay

## 2021-10-31 ENCOUNTER — Other Ambulatory Visit: Payer: Self-pay

## 2021-10-31 DIAGNOSIS — M1A09X0 Idiopathic chronic gout, multiple sites, without tophus (tophi): Secondary | ICD-10-CM | POA: Diagnosis present

## 2021-10-31 DIAGNOSIS — E876 Hypokalemia: Secondary | ICD-10-CM | POA: Diagnosis not present

## 2021-10-31 DIAGNOSIS — L732 Hidradenitis suppurativa: Principal | ICD-10-CM | POA: Diagnosis present

## 2021-10-31 DIAGNOSIS — R000 Tachycardia, unspecified: Secondary | ICD-10-CM | POA: Diagnosis not present

## 2021-10-31 DIAGNOSIS — I456 Pre-excitation syndrome: Secondary | ICD-10-CM | POA: Diagnosis present

## 2021-10-31 DIAGNOSIS — Z881 Allergy status to other antibiotic agents status: Secondary | ICD-10-CM

## 2021-10-31 DIAGNOSIS — D509 Iron deficiency anemia, unspecified: Secondary | ICD-10-CM | POA: Diagnosis present

## 2021-10-31 DIAGNOSIS — Z8701 Personal history of pneumonia (recurrent): Secondary | ICD-10-CM

## 2021-10-31 DIAGNOSIS — Z833 Family history of diabetes mellitus: Secondary | ICD-10-CM

## 2021-10-31 DIAGNOSIS — L03111 Cellulitis of right axilla: Secondary | ICD-10-CM | POA: Diagnosis present

## 2021-10-31 DIAGNOSIS — R7303 Prediabetes: Secondary | ICD-10-CM | POA: Diagnosis present

## 2021-10-31 DIAGNOSIS — Z87891 Personal history of nicotine dependence: Secondary | ICD-10-CM

## 2021-10-31 DIAGNOSIS — Z882 Allergy status to sulfonamides status: Secondary | ICD-10-CM

## 2021-10-31 DIAGNOSIS — A419 Sepsis, unspecified organism: Secondary | ICD-10-CM

## 2021-10-31 DIAGNOSIS — Z880 Allergy status to penicillin: Secondary | ICD-10-CM

## 2021-10-31 DIAGNOSIS — Z800 Family history of malignant neoplasm of digestive organs: Secondary | ICD-10-CM

## 2021-10-31 DIAGNOSIS — Z8249 Family history of ischemic heart disease and other diseases of the circulatory system: Secondary | ICD-10-CM

## 2021-10-31 DIAGNOSIS — D75838 Other thrombocytosis: Secondary | ICD-10-CM | POA: Diagnosis present

## 2021-10-31 DIAGNOSIS — Z872 Personal history of diseases of the skin and subcutaneous tissue: Secondary | ICD-10-CM

## 2021-10-31 DIAGNOSIS — E669 Obesity, unspecified: Secondary | ICD-10-CM | POA: Diagnosis present

## 2021-10-31 DIAGNOSIS — Z6841 Body Mass Index (BMI) 40.0 and over, adult: Secondary | ICD-10-CM

## 2021-10-31 DIAGNOSIS — D75839 Thrombocytosis, unspecified: Secondary | ICD-10-CM | POA: Diagnosis present

## 2021-10-31 DIAGNOSIS — L0291 Cutaneous abscess, unspecified: Principal | ICD-10-CM

## 2021-10-31 DIAGNOSIS — Z803 Family history of malignant neoplasm of breast: Secondary | ICD-10-CM

## 2021-10-31 DIAGNOSIS — Z79899 Other long term (current) drug therapy: Secondary | ICD-10-CM

## 2021-10-31 LAB — COMPREHENSIVE METABOLIC PANEL
ALT: 6 U/L (ref 0–44)
AST: 11 U/L — ABNORMAL LOW (ref 15–41)
Albumin: 3.6 g/dL (ref 3.5–5.0)
Alkaline Phosphatase: 63 U/L (ref 38–126)
Anion gap: 12 (ref 5–15)
BUN: 8 mg/dL (ref 6–20)
CO2: 21 mmol/L — ABNORMAL LOW (ref 22–32)
Calcium: 9.6 mg/dL (ref 8.9–10.3)
Chloride: 103 mmol/L (ref 98–111)
Creatinine, Ser: 0.66 mg/dL (ref 0.44–1.00)
GFR, Estimated: >60 mL/min (ref >60)
Glucose, Bld: 118 mg/dL — ABNORMAL HIGH (ref 70–99)
Potassium: 3.5 mmol/L (ref 3.5–5.1)
Sodium: 136 mmol/L (ref 135–145)
Total Bilirubin: 0.3 mg/dL (ref 0.3–1.2)
Total Protein: 8.2 g/dL — ABNORMAL HIGH (ref 6.5–8.1)

## 2021-10-31 LAB — CBC WITH DIFFERENTIAL/PLATELET
Abs Immature Granulocytes: 0.04 10*3/uL (ref 0.00–0.07)
Basophils Absolute: 0.1 10*3/uL (ref 0.0–0.1)
Basophils Relative: 0 %
Eosinophils Absolute: 0.1 10*3/uL (ref 0.0–0.5)
Eosinophils Relative: 1 %
HCT: 33.7 % — ABNORMAL LOW (ref 36.0–46.0)
Hemoglobin: 10.4 g/dL — ABNORMAL LOW (ref 12.0–15.0)
Immature Granulocytes: 0 %
Lymphocytes Relative: 22 %
Lymphs Abs: 2.8 10*3/uL (ref 0.7–4.0)
MCH: 24 pg — ABNORMAL LOW (ref 26.0–34.0)
MCHC: 30.9 g/dL (ref 30.0–36.0)
MCV: 77.8 fL — ABNORMAL LOW (ref 80.0–100.0)
Monocytes Absolute: 0.6 10*3/uL (ref 0.1–1.0)
Monocytes Relative: 5 %
Neutro Abs: 9.1 10*3/uL — ABNORMAL HIGH (ref 1.7–7.7)
Neutrophils Relative %: 72 %
Platelets: 589 10*3/uL — ABNORMAL HIGH (ref 150–400)
RBC: 4.33 MIL/uL (ref 3.87–5.11)
RDW: 20.2 % — ABNORMAL HIGH (ref 11.5–15.5)
WBC: 12.7 10*3/uL — ABNORMAL HIGH (ref 4.0–10.5)
nRBC: 0 % (ref 0.0–0.2)

## 2021-10-31 LAB — PREGNANCY, URINE: Preg Test, Ur: NEGATIVE

## 2021-10-31 LAB — LACTIC ACID, PLASMA: Lactic Acid, Venous: 0.6 mmol/L (ref 0.5–1.9)

## 2021-10-31 LAB — LIPASE, BLOOD: Lipase: 11 U/L (ref 11–51)

## 2021-10-31 MED ORDER — SODIUM CHLORIDE 0.9 % IV BOLUS
1000.0000 mL | Freq: Once | INTRAVENOUS | Status: AC
  Administered 2021-10-31: 1000 mL via INTRAVENOUS

## 2021-10-31 MED ORDER — KETOROLAC TROMETHAMINE 15 MG/ML IJ SOLN
15.0000 mg | Freq: Once | INTRAMUSCULAR | Status: AC
  Administered 2021-10-31: 15 mg via INTRAVENOUS
  Filled 2021-10-31: qty 1

## 2021-10-31 MED ORDER — VANCOMYCIN HCL IN DEXTROSE 1-5 GM/200ML-% IV SOLN
1000.0000 mg | Freq: Once | INTRAVENOUS | Status: AC
  Administered 2021-10-31: 1000 mg via INTRAVENOUS
  Filled 2021-10-31: qty 200

## 2021-10-31 MED ORDER — CEFEPIME HCL 2 G IV SOLR
2.0000 g | Freq: Three times a day (TID) | INTRAVENOUS | Status: DC
Start: 2021-10-31 — End: 2021-11-05
  Administered 2021-10-31 – 2021-11-05 (×15): 2 g via INTRAVENOUS
  Filled 2021-10-31 (×17): qty 12.5

## 2021-10-31 MED ORDER — OXYCODONE HCL 5 MG PO TABS
5.0000 mg | ORAL_TABLET | Freq: Once | ORAL | Status: AC
  Administered 2021-10-31: 5 mg via ORAL
  Filled 2021-10-31: qty 1

## 2021-10-31 MED ORDER — VANCOMYCIN HCL 1250 MG/250ML IV SOLN
1250.0000 mg | Freq: Two times a day (BID) | INTRAVENOUS | Status: DC
  Filled 2021-10-31: qty 250

## 2021-10-31 MED ORDER — FENTANYL CITRATE PF 50 MCG/ML IJ SOSY
100.0000 ug | PREFILLED_SYRINGE | Freq: Once | INTRAMUSCULAR | Status: AC
  Administered 2021-10-31: 100 ug via INTRAVENOUS
  Filled 2021-10-31: qty 2

## 2021-10-31 MED ORDER — OXYCODONE HCL 5 MG PO TABS
10.0000 mg | ORAL_TABLET | Freq: Once | ORAL | Status: AC
  Administered 2021-10-31: 10 mg via ORAL
  Filled 2021-10-31: qty 2

## 2021-10-31 NOTE — ED Provider Notes (Signed)
[facility] DEPT Provider Note   CSN: [id number] Arrival date & time: 10/31/21  1442     History {Add pertinent medical, surgical, social history, OB history to HPI:1} Chief Complaint  Patient presents with   Dizziness   Tachycardia    Amanda Davenport is a 41 y.o. female.  Patient is a 41 year old female with past medical history of hidradenitis presenting for neurolyse unwellness.  Patient admits to fevers, chills, headache, palpitations, nausea without vomiting.  Endorses multiple abscesses secondary to hidradenitis cysts in right armpit and abdomen/groin region that are actively draining foul smelling pus.  Prior history of sepsis secondary to abscesses requiring admission and IV antibiotics.  Planned surgery for right axillary hidradenitis abscess in the next 2 weeks.  The history is provided by the patient. No language interpreter was used.  Dizziness Associated symptoms: palpitations   Associated symptoms: no chest pain, no shortness of breath and no vomiting        Home Medications Prior to Admission medications   Medication Sig Start Date End Date Taking? Authorizing Provider  acetaminophen-codeine (TYLENOL #4) 300-60 MG tablet Take 1 tablet by mouth every 4 (four) hours as needed. 05/14/21   Shawna Clamp, MD  Cyanocobalamin (B-12) 1000 MCG SUBL Place 2,000 mcg under the tongue daily. 07/31/21   Brunetta Genera, MD  ibuprofen (ADVIL) 800 MG tablet TAKE 1 TABLET BY MOUTH EVERY 8 HOURS AS NEEDED FOR MODERATE PAIN. EAT BEFORE TAKING MEDICATION 03/14/20   Charlott Rakes, MD  ondansetron (ZOFRAN) 4 MG tablet TAKE 1 TABLET(4 MG) BY MOUTH EVERY 8 HOURS AS NEEDED FOR NAUSEA OR VOMITING 04/02/20   Azzie Glatter, FNP  Vitamin D, Ergocalciferol, (DRISDOL) 1.25 MG (50000 UNIT) CAPS capsule Take 50,000 Units by mouth once a week. 03/16/21   [provider]      Allergies    Other, Penicillins, Shellfish-derived products, Shrimp extract  allergy skin test, Shrimp [shellfish allergy], and Sulfa antibiotics    Review of Systems   Review of Systems  Constitutional:  Negative for chills and fever.  HENT:  Negative for ear pain and sore throat.   Eyes:  Negative for pain and visual disturbance.  Respiratory:  Negative for cough and shortness of breath.   Cardiovascular:  Positive for palpitations. Negative for chest pain.  Gastrointestinal:  Negative for abdominal pain and vomiting.  Genitourinary:  Negative for dysuria and hematuria.  Musculoskeletal:  Negative for arthralgias and back pain.  Skin:  Negative for color change and rash.  Neurological:  Positive for dizziness. Negative for seizures and syncope.  All other systems reviewed and are negative.   Physical Exam Updated Vital Signs BP (!) 100/51   Pulse 85   Temp 98.5 F (36.9 C)   Resp 15   Ht '5\' 9"'$  (1.753 m)   Wt 125.2 kg   SpO2 98%   BMI 40.76 kg/m  Physical Exam Vitals and nursing note reviewed.  Constitutional:      General: She is not in acute distress.    Appearance: She is well-developed.  HENT:     Head: Normocephalic and atraumatic.  Eyes:     Conjunctiva/sclera: Conjunctivae normal.  Cardiovascular:     Rate and Rhythm: Regular rhythm. Tachycardia present.     Heart sounds: No murmur heard. Pulmonary:     Effort: Pulmonary effort is normal. No respiratory distress.     Breath sounds: Normal breath sounds.  Abdominal:     Palpations: Abdomen is soft.  Tenderness: There is no abdominal tenderness.  Musculoskeletal:        General: No swelling.     Cervical back: Neck supple.  Skin:    General: Skin is warm and dry.     Capillary Refill: Capillary refill takes less than 2 seconds.       Neurological:     Mental Status: She is alert.  Psychiatric:        Mood and Affect: Mood normal.     ED Results / Procedures / Treatments   Labs (all labs ordered are listed, but only abnormal results are displayed) Labs Reviewed  CBC  WITH DIFFERENTIAL/PLATELET - Abnormal; Notable for the following components:      Result Value   WBC 12.7 (*)    Hemoglobin 10.4 (*)    HCT 33.7 (*)    MCV 77.8 (*)    MCH 24.0 (*)    RDW 20.2 (*)    Platelets 589 (*)    Neutro Abs 9.1 (*)    All other components within normal limits  COMPREHENSIVE METABOLIC PANEL - Abnormal; Notable for the following components:   CO2 21 (*)    Glucose, Bld 118 (*)    Total Protein 8.2 (*)    AST 11 (*)    All other components within normal limits  CULTURE, BLOOD (ROUTINE X 2)  CULTURE, BLOOD (ROUTINE X 2)  LIPASE, BLOOD  LACTIC ACID, PLASMA  PREGNANCY, URINE  LACTIC ACID, PLASMA    EKG EKG Interpretation  Date/Time:  Friday October 31 2021 14:53:58 EDT Ventricular Rate:  124 PR Interval:  130 QRS Duration: 74 QT Interval:  326 QTC Calculation: 468 R Axis:   36 Text Interpretation: Sinus tachycardia Otherwise normal ECG When compared with ECG of 09-May-2021 22:04, No significant change was found Confirmed by Campbell Stall ([id number]) on 1/49/7026 5:09:47 PM  Radiology No results found.  Procedures .Critical Care  Performed by: Lianne Cure, DO Authorized by: Lianne Cure, DO   Critical care provider statement:    Critical care time (minutes):  74   Critical care was necessary to treat or prevent imminent or life-threatening deterioration of the following conditions:  Sepsis   Critical care was time spent personally by me on the following activities:  Development of treatment plan with patient or surrogate, discussions with consultants, evaluation of patient's response to treatment, examination of patient, ordering and review of laboratory studies, ordering and review of radiographic studies, ordering and performing treatments and interventions, pulse oximetry, re-evaluation of patient's condition and review of old charts   Care discussed with: admitting provider     {Document cardiac monitor, telemetry assessment procedure when  appropriate:1}  Medications Ordered in ED Medications  ceFEPIme (MAXIPIME) 2 g in sodium chloride 0.9 % 100 mL IVPB (0 g Intravenous Stopped 10/31/21 1705)  vancomycin (VANCOCIN) IVPB 1000 mg/200 mL premix (0 mg Intravenous Stopped 10/31/21 1805)    Followed by  vancomycin (VANCOCIN) IVPB 1000 mg/200 mL premix (1,000 mg Intravenous New Bag/Given 10/31/21 1827)  vancomycin (VANCOREADY) IVPB 1250 mg/250 mL (has no administration in time range)  sodium chloride 0.9 % bolus 1,000 mL (has no administration in time range)  sodium chloride 0.9 % bolus 1,000 mL (0 mLs Intravenous Stopped 10/31/21 1817)  oxyCODONE (Oxy IR/ROXICODONE) immediate release tablet 10 mg (10 mg Oral Given 10/31/21 1628)  ketorolac (TORADOL) 15 MG/ML injection 15 mg (15 mg Intravenous Given 10/31/21 1628)    ED Course/ Medical Decision Making/ A&P  Medical Decision Making Amount and/or Complexity of Data Reviewed Labs: ordered.  Risk Prescription drug management.   45:3 PM 41 year old female with past medical history of hidradenitis presenting for neurolyse unwellness.  Patient is alert and oriented x3, no acute distress, afebrile with low blood pressures 106/73 with tachycardia 129 bpm. On physical exam patient has greater than 10 regions actively draining, purulent malodorous-fluid under patients panus and between bilateral inguinal clefts.  Concern for sepsis.  Blood cultures and lactic acid sent.  Leukocytosis of 12.6.  Given 2 sirs criteria will activate sepsis at this time.  Broad-spectrum antibiotics for skin coverage given including vancomycin and cefepime.  IV fluids given and pain medications for tachycardia.  Patient recommended for admission for sepsis secondary to abscesses at this time.  And agreeable to plan.  {Document critical care time when appropriate:1} {Document review of labs and clinical decision tools ie heart score, Chads2Vasc2 etc:1}  {Document your independent review of  radiology images, and any outside records:1} {Document your discussion with family members, caretakers, and with consultants:1} {Document social determinants of health affecting pt's care:1} {Document your decision making why or why not admission, treatments were needed:1} Final Clinical Impression(s) / ED Diagnoses Final diagnoses:  Abscess  H/O hidradenitis suppurativa  Sepsis, due to unspecified organism, unspecified whether acute organ dysfunction present [facility])    Rx / DC Orders ED Discharge Orders     None

## 2021-10-31 NOTE — Progress Notes (Signed)
Pharmacy Antibiotic Note  Amanda Davenport is a 41 y.o. female for which pharmacy has been consulted for cefepime and vancomycin dosing for sepsis.  Patient with a history of hidradenitis with history of sepsis secondary to abscesses. Reportedly has rt axillary hidradenitis abscess surgery planned in the upcoming weeks.   SCr 0.66 WBC 12.7; LA 0.6; T 98.5 F; HR 129; RR 20  Plan: Cefepime 2g q8hr Vancomycin 2000 mg once then 1250 mg q12hr (eAUC 467) unless change in renal function Trend WBC, Fever, Renal function, & Clinical course F/u cultures, clinical course, WBC, fever De-escalate when able  Height: '5\' 9"'$  (175.3 cm) Weight: 125.2 kg (276 lb 0.3 oz) IBW/kg (Calculated) : 66.2  Temp (24hrs), Avg:98.5 F (36.9 C), Min:98.5 F (36.9 C), Max:98.5 F (36.9 C)  Recent Labs  Lab 10/31/21 1513  WBC 12.7*    CrCl cannot be calculated (Patient's most recent lab result is older than the maximum 21 days allowed.).    Allergies  Allergen Reactions   Other Other (See Comments)    All Antibiotics cause severe vaginal yeast infections   Penicillins Hives, Itching, Swelling and Rash    Has patient had a PCN reaction causing immediate rash, facial/tongue/throat swelling, SOB or lightheadedness with hypotension: Yes Has patient had a PCN reaction causing severe rash involving mucus membranes or skin necrosis: Yes Has patient had a PCN reaction that required hospitalization Yes Has patient had a PCN reaction occurring within the last 10 years: Yes If all of the above answers are "NO", then may proceed with Cephalosporin use.  Has patient had a PCN reaction causing immediate rash, facial/tongue/throat swelling, SOB or lightheadedness with hypotension: Yes Has patient had a PCN reaction causing severe rash involving mucus membranes or skin necrosis: Yes Has patient had a PCN reaction that required hospitalization Yes Has patient had a PCN reaction occurring within the last 10 years: Yes If  all of the above answers are "NO", then may proceed with Cephalosporin use. Has patient had a PCN reaction causing immediate rash, facial/tongue/throat swelling, SOB or lightheadedness with hypotension: Yes Has patient had a PCN reaction causing severe rash involving mucus membranes or skin necrosis: Yes Has patient had a PCN reaction that required hospitalization Yes Has patient had a PCN reaction occurring within the last 10 years: Yes If all of the above answers are "NO", then may proceed with Cephalosporin use.   Shellfish-Derived Products Hives   Shrimp Extract Allergy Skin Test    Shrimp [Shellfish Allergy] Hives   Sulfa Antibiotics Rash    Antimicrobials this admission: vancomycin 7/28 >>  cefepime 7/28 >>   Microbiology results: Pending  Thank you for allowing pharmacy to be a part of this patient's care.  Lorelei Pont, PharmD, BCPS 10/31/2021 4:04 PM ED Clinical Pharmacist -  [phone]

## 2021-10-31 NOTE — Progress Notes (Signed)
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  She is followed by general surgery Dr. Amado Coe outpatient and has planned excision of some of the lesions in combination with plastic surgery.  She has seen dermatology at [facility] and was previously on placed on Humira but had worsening symptoms and was admitted for sepsis back in 04/2021.    She is currently septic with tachycardia, leukocytosis.She is hypotension with SBP in the 90-100 that ED physicians believes is after receiving oxycodone.  Has been given 2 L of normal saline fluid and started on cefepime and vancomycin for  Plan of care: The patient is accepted for admission to Progressive unit, at [facility]  ED physician to continue care of patient while she remains in the ED.  Author: Orene Desanctis, DO 10/31/2021  Check [email or web address] for on-call coverage.  Nursing staff, Please call [facility] number on Amion as soon as patient's arrival, so appropriate admitting provider can evaluate the pt.

## 2021-10-31 NOTE — ED Triage Notes (Signed)
Patient here POV from Home.  Endorses Headache, Chills, Fatigue, SOB, Moderate Nausea, Diarrhea.  No Known Fevers. No Emesis. Recently treated for Sepsis in January (States Symptoms are Similar).  NAD Noted during Triage, A&Ox4. GCS 15. BIB Wheelchair.

## 2021-11-01 DIAGNOSIS — D75839 Thrombocytosis, unspecified: Secondary | ICD-10-CM | POA: Diagnosis present

## 2021-11-01 DIAGNOSIS — D508 Other iron deficiency anemias: Secondary | ICD-10-CM | POA: Diagnosis not present

## 2021-11-01 DIAGNOSIS — Z79899 Other long term (current) drug therapy: Secondary | ICD-10-CM | POA: Diagnosis not present

## 2021-11-01 DIAGNOSIS — R7303 Prediabetes: Secondary | ICD-10-CM | POA: Diagnosis present

## 2021-11-01 DIAGNOSIS — E876 Hypokalemia: Secondary | ICD-10-CM | POA: Diagnosis not present

## 2021-11-01 DIAGNOSIS — L0291 Cutaneous abscess, unspecified: Secondary | ICD-10-CM | POA: Diagnosis not present

## 2021-11-01 DIAGNOSIS — Z800 Family history of malignant neoplasm of digestive organs: Secondary | ICD-10-CM | POA: Diagnosis not present

## 2021-11-01 DIAGNOSIS — Z881 Allergy status to other antibiotic agents status: Secondary | ICD-10-CM | POA: Diagnosis not present

## 2021-11-01 DIAGNOSIS — D75838 Other thrombocytosis: Secondary | ICD-10-CM | POA: Diagnosis present

## 2021-11-01 DIAGNOSIS — E66812 Obesity, class 2: Secondary | ICD-10-CM | POA: Diagnosis present

## 2021-11-01 DIAGNOSIS — L732 Hidradenitis suppurativa: Secondary | ICD-10-CM | POA: Diagnosis present

## 2021-11-01 DIAGNOSIS — Z882 Allergy status to sulfonamides status: Secondary | ICD-10-CM | POA: Diagnosis not present

## 2021-11-01 DIAGNOSIS — E669 Obesity, unspecified: Secondary | ICD-10-CM | POA: Diagnosis present

## 2021-11-01 DIAGNOSIS — Z803 Family history of malignant neoplasm of breast: Secondary | ICD-10-CM | POA: Diagnosis not present

## 2021-11-01 DIAGNOSIS — Z833 Family history of diabetes mellitus: Secondary | ICD-10-CM | POA: Diagnosis not present

## 2021-11-01 DIAGNOSIS — Z87891 Personal history of nicotine dependence: Secondary | ICD-10-CM | POA: Diagnosis not present

## 2021-11-01 DIAGNOSIS — Z6841 Body Mass Index (BMI) 40.0 and over, adult: Secondary | ICD-10-CM | POA: Diagnosis not present

## 2021-11-01 DIAGNOSIS — M1A09X0 Idiopathic chronic gout, multiple sites, without tophus (tophi): Secondary | ICD-10-CM | POA: Diagnosis present

## 2021-11-01 DIAGNOSIS — Z8249 Family history of ischemic heart disease and other diseases of the circulatory system: Secondary | ICD-10-CM | POA: Diagnosis not present

## 2021-11-01 DIAGNOSIS — Z8701 Personal history of pneumonia (recurrent): Secondary | ICD-10-CM | POA: Diagnosis not present

## 2021-11-01 DIAGNOSIS — I456 Pre-excitation syndrome: Secondary | ICD-10-CM | POA: Diagnosis present

## 2021-11-01 DIAGNOSIS — D509 Iron deficiency anemia, unspecified: Secondary | ICD-10-CM | POA: Diagnosis present

## 2021-11-01 DIAGNOSIS — Z880 Allergy status to penicillin: Secondary | ICD-10-CM | POA: Diagnosis not present

## 2021-11-01 DIAGNOSIS — R000 Tachycardia, unspecified: Secondary | ICD-10-CM | POA: Diagnosis present

## 2021-11-01 DIAGNOSIS — L03111 Cellulitis of right axilla: Secondary | ICD-10-CM | POA: Diagnosis present

## 2021-11-01 LAB — BASIC METABOLIC PANEL
Anion gap: 8 (ref 5–15)
BUN: 6 mg/dL (ref 6–20)
CO2: 19 mmol/L — ABNORMAL LOW (ref 22–32)
Calcium: 8.9 mg/dL (ref 8.9–10.3)
Chloride: 112 mmol/L — ABNORMAL HIGH (ref 98–111)
Creatinine, Ser: 0.67 mg/dL (ref 0.44–1.00)
GFR, Estimated: >60 mL/min (ref >60)
Glucose, Bld: 90 mg/dL (ref 70–99)
Potassium: 3.7 mmol/L (ref 3.5–5.1)
Sodium: 139 mmol/L (ref 135–145)

## 2021-11-01 LAB — MAGNESIUM: Magnesium: 2 mg/dL (ref 1.7–2.4)

## 2021-11-01 LAB — CBC
HCT: 32 % — ABNORMAL LOW (ref 36.0–46.0)
Hemoglobin: 10 g/dL — ABNORMAL LOW (ref 12.0–15.0)
MCH: 24.3 pg — ABNORMAL LOW (ref 26.0–34.0)
MCHC: 31.3 g/dL (ref 30.0–36.0)
MCV: 77.9 fL — ABNORMAL LOW (ref 80.0–100.0)
Platelets: 540 10*3/uL — ABNORMAL HIGH (ref 150–400)
RBC: 4.11 MIL/uL (ref 3.87–5.11)
RDW: 20 % — ABNORMAL HIGH (ref 11.5–15.5)
WBC: 10.2 10*3/uL (ref 4.0–10.5)
nRBC: 0 % (ref 0.0–0.2)

## 2021-11-01 LAB — GLUCOSE, CAPILLARY
Glucose-Capillary: 91 mg/dL (ref 70–99)
Glucose-Capillary: 87 mg/dL (ref 70–99)
Glucose-Capillary: 97 mg/dL (ref 70–99)

## 2021-11-01 LAB — HEMOGLOBIN A1C
Hgb A1c MFr Bld: 5.3 % (ref 4.8–5.6)
Mean Plasma Glucose: 105.41 mg/dL

## 2021-11-01 MED ORDER — VANCOMYCIN HCL IN DEXTROSE 1-5 GM/200ML-% IV SOLN
1000.0000 mg | Freq: Three times a day (TID) | INTRAVENOUS | Status: DC
Start: 2021-11-01 — End: 2021-11-01
  Administered 2021-11-01: 1000 mg via INTRAVENOUS
  Filled 2021-11-01: qty 200

## 2021-11-01 MED ORDER — FENTANYL CITRATE PF 50 MCG/ML IJ SOSY
50.0000 ug | PREFILLED_SYRINGE | Freq: Once | INTRAMUSCULAR | Status: AC
  Administered 2021-11-01: 50 ug via INTRAVENOUS
  Filled 2021-11-01: qty 1

## 2021-11-01 MED ORDER — FEBUXOSTAT 40 MG PO TABS
40.0000 mg | ORAL_TABLET | Freq: Every day | ORAL | Status: DC
  Administered 2021-11-01 – 2021-11-05 (×5): 40 mg via ORAL
  Filled 2021-11-01 (×5): qty 1

## 2021-11-01 MED ORDER — ACETAMINOPHEN 325 MG PO TABS
650.0000 mg | ORAL_TABLET | Freq: Four times a day (QID) | ORAL | Status: DC | PRN
  Administered 2021-11-01 – 2021-11-04 (×2): 650 mg via ORAL
  Filled 2021-11-01 (×2): qty 2

## 2021-11-01 MED ORDER — COLCHICINE 0.6 MG PO TABS
0.6000 mg | ORAL_TABLET | Freq: Every day | ORAL | Status: DC
  Administered 2021-11-01 – 2021-11-05 (×5): 0.6 mg via ORAL
  Filled 2021-11-01 (×5): qty 1

## 2021-11-01 MED ORDER — ACETAMINOPHEN 650 MG RE SUPP: 650.0000 mg | Freq: Four times a day (QID) | RECTAL | Status: DC | PRN

## 2021-11-01 MED ORDER — PREDNISOLONE ACETATE 1 % OP SUSP
1.0000 [drp] | Freq: Four times a day (QID) | OPHTHALMIC | Status: DC
  Administered 2021-11-01 – 2021-11-05 (×17): 1 [drp] via OPHTHALMIC
  Filled 2021-11-01: qty 5

## 2021-11-01 MED ORDER — KETOROLAC TROMETHAMINE 30 MG/ML IJ SOLN
30.0000 mg | Freq: Four times a day (QID) | INTRAMUSCULAR | Status: DC | PRN
  Administered 2021-11-01 – 2021-11-02 (×6): 30 mg via INTRAVENOUS
  Filled 2021-11-01 (×8): qty 1

## 2021-11-01 MED ORDER — OXYCODONE HCL 5 MG PO TABS
5.0000 mg | ORAL_TABLET | ORAL | Status: DC | PRN
  Administered 2021-11-01 – 2021-11-03 (×4): 5 mg via ORAL
  Filled 2021-11-01 (×5): qty 1

## 2021-11-01 MED ORDER — ONDANSETRON HCL 4 MG PO TABS
4.0000 mg | ORAL_TABLET | Freq: Four times a day (QID) | ORAL | Status: DC | PRN
  Administered 2021-11-02: 4 mg via ORAL
  Filled 2021-11-01: qty 1

## 2021-11-01 MED ORDER — PANTOPRAZOLE SODIUM 40 MG PO TBEC
40.0000 mg | DELAYED_RELEASE_TABLET | Freq: Every day | ORAL | Status: DC
  Administered 2021-11-01 – 2021-11-05 (×5): 40 mg via ORAL
  Filled 2021-11-01 (×5): qty 1

## 2021-11-01 MED ORDER — METOPROLOL SUCCINATE ER 25 MG PO TB24
25.0000 mg | ORAL_TABLET | Freq: Once | ORAL | Status: AC
  Administered 2021-11-01: 25 mg via ORAL
  Filled 2021-11-01: qty 1

## 2021-11-01 MED ORDER — ONDANSETRON HCL 4 MG/2ML IJ SOLN
4.0000 mg | Freq: Four times a day (QID) | INTRAMUSCULAR | Status: DC | PRN
  Administered 2021-11-01 – 2021-11-05 (×6): 4 mg via INTRAVENOUS
  Filled 2021-11-01 (×6): qty 2

## 2021-11-01 MED ORDER — VANCOMYCIN HCL 1250 MG/250ML IV SOLN
1250.0000 mg | Freq: Two times a day (BID) | INTRAVENOUS | Status: DC
Start: 2021-11-01 — End: 2021-11-05
  Administered 2021-11-01 – 2021-11-05 (×8): 1250 mg via INTRAVENOUS
  Filled 2021-11-01 (×8): qty 250

## 2021-11-01 MED ORDER — ONDANSETRON HCL 4 MG/2ML IJ SOLN
4.0000 mg | Freq: Once | INTRAMUSCULAR | Status: AC
  Administered 2021-11-01: 4 mg via INTRAVENOUS
  Filled 2021-11-01: qty 2

## 2021-11-01 MED ORDER — FENTANYL CITRATE PF 50 MCG/ML IJ SOSY
50.0000 ug | PREFILLED_SYRINGE | INTRAMUSCULAR | Status: DC | PRN
  Administered 2021-11-01: 50 ug via INTRAVENOUS
  Filled 2021-11-01: qty 1

## 2021-11-01 MED ORDER — LACTATED RINGERS IV SOLN
INTRAVENOUS | Status: DC
Start: 2021-11-01 — End: 2021-11-02

## 2021-11-01 MED ORDER — TRAZODONE HCL 50 MG PO TABS
50.0000 mg | ORAL_TABLET | Freq: Every evening | ORAL | Status: DC | PRN
  Administered 2021-11-01 – 2021-11-02 (×2): 100 mg via ORAL
  Administered 2021-11-04: 50 mg via ORAL
  Filled 2021-11-01: qty 1
  Filled 2021-11-01 (×2): qty 2

## 2021-11-01 NOTE — ED Notes (Signed)
Requested to call back in 10 for report

## 2021-11-01 NOTE — Progress Notes (Signed)
Pharmacy Antibiotic Note  Amanda Davenport is a 41 y.o. female for which pharmacy has been consulted for cefepime and vancomycin dosing for sepsis.  Patient with a history of hidradenitis with history of sepsis secondary to abscesses. Reportedly has rt axillary hidradenitis abscess surgery planned in the upcoming weeks.   Plan: -Continue cefepime 2g q8hr -Change vancomycin back to 1250 mg q12h (Scr 0.8, eAUC 461, Vd 0.5) -Trend WBC, Fever, Renal function, & Clinical course   Height: '5\' 10"'$  (177.8 cm) Weight: 122.8 kg (270 lb 11.6 oz) IBW/kg (Calculated) : 68.5  Temp (24hrs), Avg:98.2 F (36.8 C), Min:97.6 F (36.4 C), Max:98.5 F (36.9 C)  Recent Labs  Lab 10/31/21 1513 10/31/21 1635  WBC 12.7*  --   CREATININE 0.66  --   LATICACIDVEN  --  0.6     Estimated Creatinine Clearance: 133.1 mL/min (by C-G formula based on SCr of 0.66 mg/dL).    Allergies  Allergen Reactions   Other Other (See Comments)    All Antibiotics cause severe vaginal yeast infections   Penicillins Hives, Itching, Swelling and Rash    Has patient had a PCN reaction causing immediate rash, facial/tongue/throat swelling, SOB or lightheadedness with hypotension: Yes Has patient had a PCN reaction causing severe rash involving mucus membranes or skin necrosis: Yes Has patient had a PCN reaction that required hospitalization Yes Has patient had a PCN reaction occurring within the last 10 years: Yes If all of the above answers are "NO", then may proceed with Cephalosporin use.  Has patient had a PCN reaction causing immediate rash, facial/tongue/throat swelling, SOB or lightheadedness with hypotension: Yes Has patient had a PCN reaction causing severe rash involving mucus membranes or skin necrosis: Yes Has patient had a PCN reaction that required hospitalization Yes Has patient had a PCN reaction occurring within the last 10 years: Yes If all of the above answers are "NO", then may proceed with Cephalosporin  use. Has patient had a PCN reaction causing immediate rash, facial/tongue/throat swelling, SOB or lightheadedness with hypotension: Yes Has patient had a PCN reaction causing severe rash involving mucus membranes or skin necrosis: Yes Has patient had a PCN reaction that required hospitalization Yes Has patient had a PCN reaction occurring within the last 10 years: Yes If all of the above answers are "NO", then may proceed with Cephalosporin use.   Shellfish-Derived Products Hives   Shrimp Extract Allergy Skin Test    Shrimp [Shellfish Allergy] Hives   Sulfa Antibiotics Rash    Antimicrobials this admission: vancomycin 7/28 >>  cefepime 7/28 >>   Microbiology results: 7/28 Bcx: ngtd   Thank you for allowing pharmacy to be a part of this patient's care.  Tawnya Crook, PharmD, BCPS Clinical Pharmacist 11/01/2021 12:09 PM

## 2021-11-01 NOTE — Progress Notes (Signed)
Pharmacy Antibiotic Note  Amanda Davenport is a 41 y.o. female for which pharmacy has been consulted for cefepime and vancomycin dosing for sepsis.  Patient with a history of hidradenitis with history of sepsis secondary to abscesses. Reportedly has rt axillary hidradenitis abscess surgery planned in the upcoming weeks.   SCr 0.66; WBC 12.7; LA 0.6; T 98.5 F; HR 129; RR 20  Patient remains at [facility], only available formulations for vancomycin are '1000mg'$  IVPB and '2000mg'$  IVPB, will adjust vancomycin dosing to accommodate available formulations, can redose once transferred to [facility].   Plan: Cefepime 2g q8hr Vancomycin '1000mg'$  Q8h (Scr 0.8, eAUC 567, Vd 0.5) while at [facility] Trend WBC, Fever, Renal function, & Clinical course F/u cultures, clinical course, WBC, fever De-escalate when able  Height: '5\' 9"'$  (175.3 cm) Weight: 125.2 kg (276 lb 0.3 oz) IBW/kg (Calculated) : 66.2  Temp (24hrs), Avg:98.4 F (36.9 C), Min:98.3 F (36.8 C), Max:98.5 F (36.9 C)  Recent Labs  Lab 10/31/21 1513 10/31/21 1635  WBC 12.7*  --   CREATININE 0.66  --   LATICACIDVEN  --  0.6     Estimated Creatinine Clearance: 132.5 mL/min (by C-G formula based on SCr of 0.66 mg/dL).    Allergies  Allergen Reactions   Other Other (See Comments)    All Antibiotics cause severe vaginal yeast infections   Penicillins Hives, Itching, Swelling and Rash    Has patient had a PCN reaction causing immediate rash, facial/tongue/throat swelling, SOB or lightheadedness with hypotension: Yes Has patient had a PCN reaction causing severe rash involving mucus membranes or skin necrosis: Yes Has patient had a PCN reaction that required hospitalization Yes Has patient had a PCN reaction occurring within the last 10 years: Yes If all of the above answers are "NO", then may proceed with Cephalosporin use.  Has patient had a PCN reaction causing immediate rash, facial/tongue/throat swelling, SOB or lightheadedness with hypotension:  Yes Has patient had a PCN reaction causing severe rash involving mucus membranes or skin necrosis: Yes Has patient had a PCN reaction that required hospitalization Yes Has patient had a PCN reaction occurring within the last 10 years: Yes If all of the above answers are "NO", then may proceed with Cephalosporin use. Has patient had a PCN reaction causing immediate rash, facial/tongue/throat swelling, SOB or lightheadedness with hypotension: Yes Has patient had a PCN reaction causing severe rash involving mucus membranes or skin necrosis: Yes Has patient had a PCN reaction that required hospitalization Yes Has patient had a PCN reaction occurring within the last 10 years: Yes If all of the above answers are "NO", then may proceed with Cephalosporin use.   Shellfish-Derived Products Hives   Shrimp Extract Allergy Skin Test    Shrimp [Shellfish Allergy] Hives   Sulfa Antibiotics Rash    Antimicrobials this admission: vancomycin 7/28 >>  cefepime 7/28 >>   Microbiology results: Pending   Thank you for allowing pharmacy to be a part of this patient's care.  Ardyth Harps, PharmD Clinical Pharmacist

## 2021-11-01 NOTE — ED Notes (Signed)
Report given to [facility] 

## 2021-11-01 NOTE — H&P (Signed)
History and Physical    Patient: Amanda Davenport IOX:[id number] DOB: Jan 24, 1981 DOA: 10/31/2021 DOS: the patient was seen and examined on 11/01/2021 PCP: Cipriano Mile, NP  Patient coming from: Home  Chief Complaint:  Chief Complaint  Patient presents with   Dizziness   Tachycardia   HPI: Amanda Davenport is a 41 y.o. female with medical history significant of seasonal allergies, iron deficiency anemia with history of blood transfusions, anxiety, depression, [employer], chronic headaches, gout, unspecified neuropathy, class II obesity, history of pneumonia, prediabetes, history of left hydradenitis followed by excision in 2021, history of right hidradenitis and sepsis in January of this year following trial of Humira with [facility] dermatology who presented to the emergency department with complaints of headaches, fatigue, dyspnea, nausea and diarrhea.  She has been taking antibiotics for multiple abscesses secondary to hidradenitis which have exacerbated and are now draining purulent foul-smelling discharge.  She has had fever, chills and malaise.He denied fever, chills, rhinorrhea, sore throat, wheezing or hemoptysis.  No chest pain, palpitations, diaphoresis, PND, orthopnea or pitting edema of the lower extremities.  No abdominal pain, emesis, constipation, melena or hematochezia.  No flank pain, dysuria, frequency or hematuria.  No polyuria, polydipsia, polyphagia or blurred vision.   ED course: Initial vital signs were temperature 98.5 F, pulse 129, respiration 20, BP 106/73 mmHg and O2 sat 98% on room air.  The patient received 2000 mL of normal saline bolus, oxycodone 10 mg p.o. x1, oxycodone 5 mg p.o. x1, fentanyl 100 mcg IVP x1, ketorolac 15 mg IVP x1, vancomycin and cefepime per pharmacy.  Lab work: CBC showed a white count of 12.7 with 72% neutrophils, hemoglobin 10.4 g/dL with an MCV of 70.8 fL and platelets 589.  Lipase and lactic acid were normal.  Urine pregnancy test  negative.  CMP showed a CO2 level of 21 mmol/L with a normal anion gap, the rest of the electrolytes and renal function were normal.  Total protein is 8.2 g/dL and AST 11 units/L, the rest of the LFTs were normal.   Review of Systems: As mentioned in the history of present illness. All other systems reviewed and are negative.  Past Medical History:  Diagnosis Date   Allergy    Anemia    receives transfusions periodically   Anxiety    Arrhythmia    Chronic headache    Depression    Dysrhythmia    Gout 12/2018   Knee pain    Nearsightedness    wears glasses   Neuropathy    Obesity    Pneumonia    Pre-diabetes    Recurrent boils    WPW (Wolff-Parkinson-White syndrome)    Past Surgical History:  Procedure Laterality Date   ADJACENT TISSUE TRANSFER/TISSUE REARRANGEMENT Left 07/28/2019   Procedure: ADJACENT TISSUE TRANSFER TO LEFT AXILLA GREATER THAN 100 CM SQUARED;  Surgeon: Irene Limbo, MD;  Location: [facility];  Service: Plastics;  Laterality: Left;   HYDRADENITIS EXCISION Left 07/28/2019   Procedure: EXCISION LEFT HIDRADENITIS AXILLA;  Surgeon: Clovis Riley, MD;  Location: [facility];  Service: General;  Laterality: Left;   TONSILLECTOMY     Social History:  reports that she quit smoking about 2 years ago. Her smoking use included cigarettes and cigars. She has never used smokeless tobacco. She reports current drug use. Frequency: 7.00 times per week. Drug: Marijuana. She reports that she does not drink alcohol.  Allergies  Allergen Reactions   Other Other (See Comments)    All  Antibiotics cause severe vaginal yeast infections   Penicillins Hives, Itching, Swelling and Rash    Has patient had a PCN reaction causing immediate rash, facial/tongue/throat swelling, SOB or lightheadedness with hypotension: Yes Has patient had a PCN reaction causing severe rash involving mucus membranes or skin necrosis: Yes Has patient had a PCN reaction that required hospitalization Yes Has  patient had a PCN reaction occurring within the last 10 years: Yes If all of the above answers are "NO", then may proceed with Cephalosporin use.  Has patient had a PCN reaction causing immediate rash, facial/tongue/throat swelling, SOB or lightheadedness with hypotension: Yes Has patient had a PCN reaction causing severe rash involving mucus membranes or skin necrosis: Yes Has patient had a PCN reaction that required hospitalization Yes Has patient had a PCN reaction occurring within the last 10 years: Yes If all of the above answers are "NO", then may proceed with Cephalosporin use. Has patient had a PCN reaction causing immediate rash, facial/tongue/throat swelling, SOB or lightheadedness with hypotension: Yes Has patient had a PCN reaction causing severe rash involving mucus membranes or skin necrosis: Yes Has patient had a PCN reaction that required hospitalization Yes Has patient had a PCN reaction occurring within the last 10 years: Yes If all of the above answers are "NO", then may proceed with Cephalosporin use.   Shellfish-Derived Products Hives   Shrimp Extract Allergy Skin Test    Shrimp [Shellfish Allergy] Hives   Sulfa Antibiotics Rash    Family History  Problem Relation Age of Onset   Breast cancer Mother    Pulmonary embolism Mother        died of PE   Colon cancer Mother    Irritable bowel syndrome Mother    Cancer Mother    Hypertension Father    Diabetes Paternal Grandmother    Heart disease Neg Hx    Stroke Neg Hx     Prior to Admission medications   Medication Sig Start Date End Date Taking? Authorizing Provider  HYDROcodone-acetaminophen (NORCO/VICODIN) 5-325 MG tablet Take 1 tablet by mouth every 6 (six) hours as needed. 10/24/21  Yes [provider]  ibuprofen (ADVIL) 800 MG tablet TAKE 1 TABLET BY MOUTH EVERY 8 HOURS AS NEEDED FOR MODERATE PAIN. EAT BEFORE TAKING MEDICATION 03/14/20  Yes Charlott Rakes, MD  metoprolol succinate (TOPROL-XL) 25 MG  24 hr tablet Take 25 mg by mouth once.   Yes [provider]  pantoprazole (PROTONIX) 40 MG tablet Take 40 mg by mouth daily. 10/24/21  Yes [provider]  SSD 1 % cream Apply 1 Application topically daily. 08/08/21  Yes [provider]  traZODone (DESYREL) 50 MG tablet Take 50-100 mg by mouth at bedtime as needed. 10/18/21  Yes [provider]  Vitamin D, Ergocalciferol, (DRISDOL) 1.25 MG (50000 UNIT) CAPS capsule Take 50,000 Units by mouth once a week. 03/16/21  Yes [provider]  acetaminophen-codeine (TYLENOL #4) 300-60 MG tablet Take 1 tablet by mouth every 4 (four) hours as needed. 05/14/21   Shawna Clamp, MD  Cyanocobalamin (B-12) 1000 MCG SUBL Place 2,000 mcg under the tongue daily. 07/31/21   Brunetta Genera, MD  ondansetron (ZOFRAN) 4 MG tablet TAKE 1 TABLET(4 MG) BY MOUTH EVERY 8 HOURS AS NEEDED FOR NAUSEA OR VOMITING 04/02/20   Azzie Glatter, FNP    Physical Exam: Vitals:   11/01/21 0800 11/01/21 0911 11/01/21 0945 11/01/21 1102  BP: 101/62     Pulse: 85  82  Resp: (!) 28  19   Temp:  97.6 F (36.4 C)    TempSrc:  Oral    SpO2: 97%  100%   Weight:    122.8 kg  Height:    '5\' 10"'$  (1.778 m)   Physical Exam Vitals and nursing note reviewed.  Constitutional:      General: She is awake.     Appearance: Normal appearance. She is obese.  HENT:     Head: Normocephalic.     Mouth/Throat:     Mouth: Mucous membranes are moist.  Eyes:     General: No scleral icterus.    Pupils: Pupils are equal, round, and reactive to light.  Neck:     Vascular: No JVD.  Cardiovascular:     Rate and Rhythm: Normal rate and regular rhythm.     Heart sounds: S1 normal and S2 normal.  Pulmonary:     Effort: Pulmonary effort is normal.     Breath sounds: No wheezing, rhonchi or rales.  Abdominal:     General: Bowel sounds are normal. There is no distension.     Palpations: Abdomen is soft.     Tenderness: There is no abdominal tenderness.  There is no right CVA tenderness or guarding.  Musculoskeletal:     Cervical back: Neck supple.     Right lower leg: No edema.     Left lower leg: No edema.  Skin:    Comments: Furuncles on multiple sites right axilla, under breasts, inguinal area in the lower extremities with foul-smelling discharge, edema, erythema calor and TTP.  Neurological:     General: No focal deficit present.     Mental Status: She is alert and oriented to person, place, and time.  Psychiatric:        Mood and Affect: Mood normal.        Behavior: Behavior normal. Behavior is cooperative.    Data Reviewed:  Results are pending, will review when available.  Assessment and Plan: Principal Problem:   Abscess Secondary to:   Hydradenitis Admit to PCU/inpatient. Continue IV fluids. Continue cefepime 2 g every 8 hours.   Continue vancomycin per pharmacy. Follow-up blood culture and sensitivity Follow CBC and CMP in a.m. General surgery will evaluate.  Active Problems:   Iron deficiency anemia Monitor hematocrit and hemoglobin. Transfuse as needed.    Wolff-Parkinson-White (WPW) syndrome Currently having soft blood pressures. Resume metoprolol once blood pressure improves    Class 2 obesity BMI is 38.84 kg/m. Follow-up with PCP.    Prediabetes Check hemoglobin A1c. Carbohydrate modified diet. CBG before meals and bedtime.    Thrombocytosis Monitor platelet count.    Advance Care Planning:   Code Status: Full Code   Consults: General surgery Christie Beckers, MD)  Family Communication:   Severity of Illness: The appropriate patient status for this patient is INPATIENT. Inpatient status is judged to be reasonable and necessary in order to provide the required intensity of service to ensure the patient's safety. The patient's presenting symptoms, physical exam findings, and initial radiographic and laboratory data in the context of their chronic comorbidities is felt to place them at high risk  for further clinical deterioration. Furthermore, it is not anticipated that the patient will be medically stable for discharge from the hospital within 2 midnights of admission.   * I certify that at the point of admission it is my clinical judgment that the patient will require inpatient hospital care spanning beyond 2 midnights from the point of  admission due to high intensity of service, high risk for further deterioration and high frequency of surveillance required.*  Author: Reubin Milan, MD 11/01/2021 11:33 AM  For on call review [email or web address].   This document was prepared using Dragon voice recognition software and may contain some unintended transcription errors.

## 2021-11-02 ENCOUNTER — Encounter (HOSPITAL_COMMUNITY): Payer: Self-pay | Admitting: Family Medicine

## 2021-11-02 DIAGNOSIS — L732 Hidradenitis suppurativa: Secondary | ICD-10-CM | POA: Diagnosis not present

## 2021-11-02 LAB — CBC WITH DIFFERENTIAL/PLATELET
Abs Immature Granulocytes: 0.02 10*3/uL (ref 0.00–0.07)
Basophils Absolute: 0.1 10*3/uL (ref 0.0–0.1)
Basophils Relative: 1 %
Eosinophils Absolute: 0.2 10*3/uL (ref 0.0–0.5)
Eosinophils Relative: 2 %
HCT: 30.4 % — ABNORMAL LOW (ref 36.0–46.0)
Hemoglobin: 9.3 g/dL — ABNORMAL LOW (ref 12.0–15.0)
Immature Granulocytes: 0 %
Lymphocytes Relative: 28 %
Lymphs Abs: 2.6 10*3/uL (ref 0.7–4.0)
MCH: 24.4 pg — ABNORMAL LOW (ref 26.0–34.0)
MCHC: 30.6 g/dL (ref 30.0–36.0)
MCV: 79.8 fL — ABNORMAL LOW (ref 80.0–100.0)
Monocytes Absolute: 0.8 10*3/uL (ref 0.1–1.0)
Monocytes Relative: 9 %
Neutro Abs: 5.6 10*3/uL (ref 1.7–7.7)
Neutrophils Relative %: 60 %
Platelets: 467 10*3/uL — ABNORMAL HIGH (ref 150–400)
RBC: 3.81 MIL/uL — ABNORMAL LOW (ref 3.87–5.11)
RDW: 20.2 % — ABNORMAL HIGH (ref 11.5–15.5)
WBC: 9.2 10*3/uL (ref 4.0–10.5)
nRBC: 0 % (ref 0.0–0.2)

## 2021-11-02 LAB — COMPREHENSIVE METABOLIC PANEL
ALT: 8 U/L (ref 0–44)
AST: 10 U/L — ABNORMAL LOW (ref 15–41)
Albumin: 2.5 g/dL — ABNORMAL LOW (ref 3.5–5.0)
Alkaline Phosphatase: 51 U/L (ref 38–126)
Anion gap: 8 (ref 5–15)
BUN: 6 mg/dL (ref 6–20)
CO2: 21 mmol/L — ABNORMAL LOW (ref 22–32)
Calcium: 8.5 mg/dL — ABNORMAL LOW (ref 8.9–10.3)
Chloride: 109 mmol/L (ref 98–111)
Creatinine, Ser: 0.65 mg/dL (ref 0.44–1.00)
GFR, Estimated: >60 mL/min (ref >60)
Glucose, Bld: 91 mg/dL (ref 70–99)
Potassium: 3.5 mmol/L (ref 3.5–5.1)
Sodium: 138 mmol/L (ref 135–145)
Total Bilirubin: 0.3 mg/dL (ref 0.3–1.2)
Total Protein: 6.7 g/dL (ref 6.5–8.1)

## 2021-11-02 MED ORDER — METOPROLOL SUCCINATE ER 25 MG PO TB24
25.0000 mg | ORAL_TABLET | Freq: Every day | ORAL | Status: DC
  Administered 2021-11-02 – 2021-11-05 (×4): 25 mg via ORAL
  Filled 2021-11-02 (×4): qty 1

## 2021-11-02 NOTE — Consult Note (Signed)
Reason for Consult: hidradenitis suppurativa  Referring Physician:  Dr. Algis Liming, [facility]  Amanda Davenport is an 41 y.o. female.  HPI: Patient is a 41 year old female known to our surgical practice with a history of hidradenitis suppurativa.  Patient had undergone excision with flap closure of hidradenitis from the left axilla in 2021 by Dr. Romana Juniper with the assistance of Dr. Irene Limbo from plastic surgery.  Patient has extensive involvement with hidradenitis involving the right axilla, the left inframammary crease, and the perineum.  She is followed by her dermatologist in [place].  Patient was placed on a trial of Humira.  She has had worsening of her disease over the past several months.  She is now admitted to the medical service for intravenous antibiotics for management of worsening hidradenitis suppurativa with cellulitis.  General surgery is asked to evaluate for possible need for operative intervention.  Past Medical History:  Diagnosis Date   Allergy    Anemia    receives transfusions periodically   Anxiety    Arrhythmia    Chronic headache    Depression    Dysrhythmia    Gout 12/2018   Knee pain    Nearsightedness    wears glasses   Neuropathy    Obesity    Pneumonia    Pre-diabetes    Recurrent boils    WPW (Wolff-Parkinson-White syndrome)     Past Surgical History:  Procedure Laterality Date   ADJACENT TISSUE TRANSFER/TISSUE REARRANGEMENT Left 07/28/2019   Procedure: ADJACENT TISSUE TRANSFER TO LEFT AXILLA GREATER THAN 100 CM SQUARED;  Surgeon: Irene Limbo, MD;  Location: [facility];  Service: Plastics;  Laterality: Left;   HYDRADENITIS EXCISION Left 07/28/2019   Procedure: EXCISION LEFT HIDRADENITIS AXILLA;  Surgeon: Clovis Riley, MD;  Location: [facility];  Service: General;  Laterality: Left;   TONSILLECTOMY      Family History  Problem Relation Age of Onset   Breast cancer Mother    Pulmonary embolism Mother         died of PE   Colon cancer Mother    Irritable bowel syndrome Mother    Cancer Mother    Hypertension Father    Diabetes Paternal Grandmother    Heart disease Neg Hx    Stroke Neg Hx     Social History:  reports that she quit smoking about 2 years ago. Her smoking use included cigarettes and cigars. She has never used smokeless tobacco. She reports current drug use. Frequency: 7.00 times per week. Drug: Marijuana. She reports that she does not drink alcohol.  Allergies:  Allergies  Allergen Reactions   Other Other (See Comments)    All Antibiotics cause severe vaginal yeast infections   Penicillins Hives, Itching, Swelling and Rash    Has patient had a PCN reaction causing immediate rash, facial/tongue/throat swelling, SOB or lightheadedness with hypotension: Yes Has patient had a PCN reaction causing severe rash involving mucus membranes or skin necrosis: Yes Has patient had a PCN reaction that required hospitalization Yes Has patient had a PCN reaction occurring within the last 10 years: Yes If all of the above answers are "NO", then may proceed with Cephalosporin use.  Has patient had a PCN reaction causing immediate rash, facial/tongue/throat swelling, SOB or lightheadedness with hypotension: Yes Has patient had a PCN reaction causing severe rash involving mucus membranes or skin necrosis: Yes Has patient had a PCN reaction that required hospitalization Yes Has patient had a PCN reaction occurring within  the last 10 years: Yes If all of the above answers are "NO", then may proceed with Cephalosporin use. Has patient had a PCN reaction causing immediate rash, facial/tongue/throat swelling, SOB or lightheadedness with hypotension: Yes Has patient had a PCN reaction causing severe rash involving mucus membranes or skin necrosis: Yes Has patient had a PCN reaction that required hospitalization Yes Has patient had a PCN reaction occurring within the last 10 years: Yes If all of the  above answers are "NO", then may proceed with Cephalosporin use.   Shellfish-Derived Products Hives   Shrimp Extract Allergy Skin Test    Shrimp [Shellfish Allergy] Hives   Sulfa Antibiotics Rash    Medications: I have reviewed the patient's current medications.  Results for orders placed or performed during the hospital encounter of 10/31/21 (from the past 48 hour(s))  CBC with Differential     Status: Abnormal   Collection Time: 10/31/21  3:13 PM  Result Value Ref Range   WBC 12.7 (H) 4.0 - 10.5 K/uL   RBC 4.33 3.87 - 5.11 MIL/uL   Hemoglobin 10.4 (L) 12.0 - 15.0 g/dL   HCT 33.7 (L) 36.0 - 46.0 %   MCV 77.8 (L) 80.0 - 100.0 fL   MCH 24.0 (L) 26.0 - 34.0 pg   MCHC 30.9 30.0 - 36.0 g/dL   RDW 20.2 (H) 11.5 - 15.5 %   Platelets 589 (H) 150 - 400 K/uL   nRBC 0.0 0.0 - 0.2 %   Neutrophils Relative % 72 %   Neutro Abs 9.1 (H) 1.7 - 7.7 K/uL   Lymphocytes Relative 22 %   Lymphs Abs 2.8 0.7 - 4.0 K/uL   Monocytes Relative 5 %   Monocytes Absolute 0.6 0.1 - 1.0 K/uL   Eosinophils Relative 1 %   Eosinophils Absolute 0.1 0.0 - 0.5 K/uL   Basophils Relative 0 %   Basophils Absolute 0.1 0.0 - 0.1 K/uL   Immature Granulocytes 0 %   Abs Immature Granulocytes 0.04 0.00 - 0.07 K/uL    Comment: Performed at [facility], [place], [place] [address]  Comprehensive metabolic panel     Status: Abnormal   Collection Time: 10/31/21  3:13 PM  Result Value Ref Range   Sodium 136 135 - 145 mmol/L   Potassium 3.5 3.5 - 5.1 mmol/L   Chloride 103 98 - 111 mmol/L   CO2 21 (L) 22 - 32 mmol/L   Glucose, Bld 118 (H) 70 - 99 mg/dL    Comment: Glucose reference range applies only to samples taken after fasting for at least 8 hours.   BUN 8 6 - 20 mg/dL   Creatinine, Ser 0.66 0.44 - 1.00 mg/dL   Calcium 9.6 8.9 - 10.3 mg/dL   Total Protein 8.2 (H) 6.5 - 8.1 g/dL   Albumin 3.6 3.5 - 5.0 g/dL   AST 11 (L) 15 - 41 U/L   ALT 6 0 - 44 U/L   Alkaline Phosphatase 63 38 -  126 U/L   Total Bilirubin 0.3 0.3 - 1.2 mg/dL   GFR, Estimated >60 >60 mL/min    Comment: (NOTE) Calculated using the CKD-EPI Creatinine Equation (2021)    Anion gap 12 5 - 15    Comment: Performed at [facility], [place], [place] [address]  Lipase, blood     Status: None   Collection Time: 10/31/21  3:13 PM  Result Value Ref Range   Lipase 11 11 - 51 U/L  Comment: Performed at [facility], [address], [place], [place] [address]  Blood culture (routine x 2)     Status: None (Preliminary result)   Collection Time: 10/31/21  3:13 PM   Specimen: BLOOD  Result Value Ref Range   Specimen Description      BLOOD LEFT HAND Performed at [facility], [address] [place], [place] [address]    Special Requests      BOTTLES DRAWN AEROBIC AND ANAEROBIC Blood Culture adequate volume Performed at [facility], [address], [place], [place] [address]    Culture      NO GROWTH < 12 HOURS Performed at [facility] [address], [place], [place] [address]    Report Status PENDING   Pregnancy, urine     Status: None   Collection Time: 10/31/21  4:00 PM  Result Value Ref Range   Preg Test, Ur NEGATIVE NEGATIVE    Comment:        THE SENSITIVITY OF THIS METHODOLOGY IS >20 mIU/mL. Performed at [facility], [address], [place], [place] [address]   Blood culture (routine x 2)     Status: None (Preliminary result)   Collection Time: 10/31/21  4:35 PM   Specimen: BLOOD  Result Value Ref Range   Specimen Description      BLOOD RIGHT HAND Performed at [facility], [address] [place], [place] [address]    Special Requests      BOTTLES DRAWN AEROBIC AND ANAEROBIC Blood Culture results may not be optimal due to an inadequate volume of blood received in culture bottles Performed at [facility], [address] [place], [place] [address]    Culture      NO GROWTH < 12 HOURS Performed at [facility] [address], [place], [place] [address]    Report Status PENDING   Lactic acid, plasma     Status: None   Collection Time: 10/31/21  4:35 PM  Result Value Ref Range   Lactic Acid, Venous 0.6 0.5 - 1.9 mmol/L    Comment: Performed at [facility], [address] [place], [place] [address]  Hemoglobin A1c     Status: None   Collection Time: 11/01/21 12:07 PM  Result Value Ref Range   Hgb A1c MFr Bld 5.3 4.8 - 5.6 %    Comment: (NOTE) Pre diabetes:          5.7%-6.4%  Diabetes:              >6.4%  Glycemic control for   <7.0% adults with diabetes    Mean Plasma Glucose 105.41 mg/dL    Comment: Performed at [facility] [address], [place], [place] [address]  Basic metabolic panel     Status: Abnormal   Collection Time: 11/01/21 12:07 PM  Result Value Ref Range   Sodium 139 135 - 145 mmol/L   Potassium 3.7 3.5 - 5.1 mmol/L   Chloride 112 (H) 98 - 111 mmol/L   CO2 19 (L) 22 - 32 mmol/L   Glucose, Bld 90 70 - 99 mg/dL    Comment: Glucose reference range applies only to samples taken after fasting for at least 8 hours.   BUN 6 6 - 20 mg/dL   Creatinine, Ser 0.67 0.44 - 1.00 mg/dL   Calcium 8.9 8.9 - 10.3 mg/dL   GFR, Estimated >60 >60 mL/min    Comment: (NOTE) Calculated  using the CKD-EPI Creatinine Equation (2021)    Anion gap 8 5 - 15    Comment: Performed at [facility], [place] [address] [place], [place] [address]  CBC     Status: Abnormal   Collection Time: 11/01/21 12:07 PM  Result Value Ref Range   WBC 10.2 4.0 - 10.5 K/uL   RBC 4.11 3.87 - 5.11 MIL/uL   Hemoglobin 10.0 (L) 12.0 - 15.0 g/dL   HCT 32.0 (L) 36.0 - 46.0 %   MCV 77.9 (L) 80.0 - 100.0 fL   MCH 24.3 (L) 26.0 - 34.0 pg   MCHC 31.3 30.0 - 36.0 g/dL   RDW 20.0 (H) 11.5 - 15.5 %   Platelets 540 (H) 150 - 400 K/uL   nRBC 0.0 0.0 - 0.2 %    Comment:  Performed at [facility], [place] [address] [place], [place] [address]  Magnesium     Status: None   Collection Time: 11/01/21 12:07 PM  Result Value Ref Range   Magnesium 2.0 1.7 - 2.4 mg/dL    Comment: Performed at [facility], [place] [address], [place], [place] [address]  Glucose, capillary     Status: None   Collection Time: 11/01/21  1:16 PM  Result Value Ref Range   Glucose-Capillary 91 70 - 99 mg/dL    Comment: Glucose reference range applies only to samples taken after fasting for at least 8 hours.  Glucose, capillary     Status: None   Collection Time: 11/01/21  5:24 PM  Result Value Ref Range   Glucose-Capillary 87 70 - 99 mg/dL    Comment: Glucose reference range applies only to samples taken after fasting for at least 8 hours.  Glucose, capillary     Status: None   Collection Time: 11/01/21 10:57 PM  Result Value Ref Range   Glucose-Capillary 97 70 - 99 mg/dL    Comment: Glucose reference range applies only to samples taken after fasting for at least 8 hours.  Comprehensive metabolic panel     Status: Abnormal   Collection Time: 11/02/21  4:45 AM  Result Value Ref Range   Sodium 138 135 - 145 mmol/L   Potassium 3.5 3.5 - 5.1 mmol/L   Chloride 109 98 - 111 mmol/L   CO2 21 (L) 22 - 32 mmol/L   Glucose, Bld 91 70 - 99 mg/dL    Comment: Glucose reference range applies only to samples taken after fasting for at least 8 hours.   BUN 6 6 - 20 mg/dL   Creatinine, Ser 0.65 0.44 - 1.00 mg/dL   Calcium 8.5 (L) 8.9 - 10.3 mg/dL   Total Protein 6.7 6.5 - 8.1 g/dL   Albumin 2.5 (L) 3.5 - 5.0 g/dL   AST 10 (L) 15 - 41 U/L   ALT 8 0 - 44 U/L   Alkaline Phosphatase 51 38 - 126 U/L   Total Bilirubin 0.3 0.3 - 1.2 mg/dL   GFR, Estimated >60 >60 mL/min    Comment: (NOTE) Calculated using the CKD-EPI Creatinine Equation (2021)    Anion gap 8 5 - 15    Comment: Performed at [facility], [place] [address] [place],   [address]  CBC with Differential/Platelet     Status: Abnormal   Collection Time: 11/02/21  7:58 AM  Result Value Ref Range   WBC 9.2 4.0 - 10.5 K/uL   RBC 3.81 (L) 3.87 - 5.11 MIL/uL   Hemoglobin 9.3 (  L) 12.0 - 15.0 g/dL   HCT 30.4 (L) 36.0 - 46.0 %   MCV 79.8 (L) 80.0 - 100.0 fL   MCH 24.4 (L) 26.0 - 34.0 pg   MCHC 30.6 30.0 - 36.0 g/dL   RDW 20.2 (H) 11.5 - 15.5 %   Platelets 467 (H) 150 - 400 K/uL   nRBC 0.0 0.0 - 0.2 %   Neutrophils Relative % 60 %   Neutro Abs 5.6 1.7 - 7.7 K/uL   Lymphocytes Relative 28 %   Lymphs Abs 2.6 0.7 - 4.0 K/uL   Monocytes Relative 9 %   Monocytes Absolute 0.8 0.1 - 1.0 K/uL   Eosinophils Relative 2 %   Eosinophils Absolute 0.2 0.0 - 0.5 K/uL   Basophils Relative 1 %   Basophils Absolute 0.1 0.0 - 0.1 K/uL   Immature Granulocytes 0 %   Abs Immature Granulocytes 0.02 0.00 - 0.07 K/uL    Comment: Performed at [facility], [place] [address] [place], [place] [address]    No results found.  Review of Systems  Constitutional: Negative.   HENT: Negative.    Eyes: Negative.   Respiratory: Negative.    Cardiovascular: Negative.   Gastrointestinal: Negative.   Endocrine: Negative.   Genitourinary: Negative.   Musculoskeletal: Negative.   Skin:  Positive for color change, rash and wound.  Allergic/Immunologic: Negative.   Neurological: Negative.   Hematological: Negative.   Psychiatric/Behavioral: Negative.      Physical Exam  Blood pressure 96/61, pulse (!) 57, temperature 98.7 F (37.1 C), resp. rate 18, height '5\' 10"'$  (1.778 m), weight 122.8 kg, SpO2 100 %.  CONSTITUTIONAL: no acute distress; conversant; no obvious deformities  EYES: Conjunctiva clear and moist; pupils equal bilaterally  NECK: trachea midline; no thyroid nodularity  SKIN: The left axilla shows postsurgical changes with healing and scar tissue formation without evidence of residual hidradenitis.  The right axilla demonstrates active hidradenitis with  multiple sinus tracts, bloody discharge, induration, and tenderness.  There is no sign of a large abscess or fluctuance though.  Skin beneath the left breast also shows sinus tracts, induration, and drainage without evidence of abscess.  GU: On the bilateral buttocks are areas consistent with hidradenitis with sinus tract formation, induration, and cloudy drainage.  Again there is no fluctuance or evidence of large abscess.  In the midline of the natal cleft there are multiple sinus tracts consistent with pilonidal disease.  Again there is no fluctuance or sign of abscess at this time.  MSK: normal range of motion of extremities; no clubbing; no cyanosis  PSYCH: appropriate affect for situation; alert and oriented to person, place, & time  LYMPHATIC: no palpable cervical lymphadenopathy    Assessment/Plan:  Hidradenitis suppurativa  Extensive involvement of the right axilla, left inframammary region, and perineum  IV antibiotics per medical service  Wound care with frequent dry dressing changes and cleansing with antibacterial soap  Will ask Dr. Romana Juniper and plastic surgery to evaluate Pilonidal disease without abscess  May require operative management at the time of management of hidradenitis  Will leave up to Dr. Romana Juniper to review.  Discussed with Dr. Algis Liming.  Surgery will follow.  Armandina Gemma, [facility] [place] [facility] Office: [phone]   Armandina Gemma 11/02/2021, 8:53 AM

## 2021-11-02 NOTE — Progress Notes (Signed)
Patient refused morning CBG check, previous CBGs have been WNL. Patient states that she will tell the doctor to discontinue CBG monitoring orders.

## 2021-11-02 NOTE — Progress Notes (Signed)
PROGRESS NOTE   Amanda Davenport  DZH:[id number]    DOB: Feb 19, 1981    DOA: 10/31/2021  PCP: Cipriano Mile, NP   I have briefly reviewed patients previous medical records in [facility].  Chief Complaint  Patient presents with   Dizziness   Tachycardia    Brief Narrative:  41 year old female with PMH of prediabetes, WPW, obesity, anxiety and depression, recurrent boils, hidradenitis suppurative, s/p excision with flap closure of hidradenitis from the left axilla in 2021, right hydradenitis and sepsis in January 2023 following trial of Humira with [facility] dermatology, presented to the ED with complaints of headache, fatigue, dyspnea, nausea and diarrhea.  She was on doxycycline PTA for her hidradenitis and now presented with multiple of these draining purulent foul-smelling discharge.  Admitted for hidradenitis suppurativa flare, treating supportively with broad-spectrum IV antibiotics.  General surgery consulted.  Assessment & Plan:  Principal Problem:   Hidradenitis suppurativa Active Problems:   Iron deficiency anemia   Wolff-Parkinson-White (WPW) syndrome   Hydradenitis   Abscess   Class 2 obesity   Prediabetes   Thrombocytosis   Hidradenitis suppurativa flare: Prior surgical history as noted above.  Patient has extensive involvement with hidradenitis involving the right axilla, left inframammary crease and the perineum.  Follows with Dr. Romana Juniper, CCS with plans for surgery on 12/05/2021.  Followed by her dermatologist in [place].  Admitted for multiple areas draining purulent drainage.  Remains on broad-spectrum IV antibiotics, cefepime and vancomycin.  General surgery consultation appreciated.  Discussed with Dr. Harlow Asa who recommended continuing IV antibiotics, wound care with frequent dry dressing changes and cleansing with antibacterial soap, no surgical interventions recommended at this time.  He will have Dr. Romana Juniper and plastic surgery to evaluate.   Blood cultures x2: Negative to date.  Leukocytosis resolved.  Iron deficiency anemia: Stable.  Follow CBC periodically and transfuse for hemoglobin 7 g or less.  Thrombocytosis: Reactive due to infectious etiology above and iron deficiency.  Improved.  WPW syndrome: Metoprolol had been held due to soft blood pressures, now that BP has normalized, will resume.  Prediabetes: A1c 5.3.  No active inpatient management.  Body mass index is 38.84 kg/m.   DVT prophylaxis: SCDs Start: 11/01/21 1113     Code Status: Full Code:  Family Communication: None at bedside Disposition:  Status is: Inpatient Remains inpatient appropriate because: IV antibiotics     Consultants:   General surgery  Procedures:     Antimicrobials:   As noted above   Subjective:  Overall feels somewhat better.  Pain better controlled on meds here.  Drainage from sites persist.  Asking why she is on a heart healthy diet and wants to be on a regular diet-changed.  Objective:   Vitals:   11/01/21 1501 11/01/21 1842 11/02/21 0027 11/02/21 1230  BP: 116/68 106/81 96/61 115/88  Pulse: 82 66 (!) 57 71  Resp: '18 18 18 20  '$ Temp: 99.1 F (37.3 C) 98.4 F (36.9 C) 98.7 F (37.1 C) 98.2 F (36.8 C)  TempSrc: Oral Oral    SpO2: 100% 100% 100% 100%  Weight:      Height:       Examined patient with female nurse tech at bedside as chaperone.  General exam: Young female, moderately built and obese lying comfortably in bed without distress. Respiratory system: Clear to auscultation. Respiratory effort normal. Cardiovascular system: S1 & S2 heard, RRR. No JVD, murmurs, rubs, gallops or clicks. No pedal edema.  Telemetry personally reviewed:  Sinus rhythm. Gastrointestinal system: Abdomen is nondistended, soft and nontender. No organomegaly or masses felt. Normal bowel sounds heard. Central nervous system: Alert and oriented. No focal neurological deficits. Extremities: Symmetric 5 x 5 power. Skin: I only  examined the right axillary region where she had a dry gauze on top of actively draining lesion.  I did not examine the rest which were just examined by the surgeon a few minutes before and are detailed in his consult note. Psychiatry: Judgement and insight appear normal. Mood & affect appropriate.     Data Reviewed:   I have personally reviewed following labs and imaging studies   CBC: Recent Labs  Lab 10/31/21 1513 11/01/21 1207 11/02/21 0758  WBC 12.7* 10.2 9.2  NEUTROABS 9.1*  --  5.6  HGB 10.4* 10.0* 9.3*  HCT 33.7* 32.0* 30.4*  MCV 77.8* 77.9* 79.8*  PLT 589* 540* 467*    Basic Metabolic Panel: Recent Labs  Lab 10/31/21 1513 11/01/21 1207 11/02/21 0445  NA 136 139 138  K 3.5 3.7 3.5  CL 103 112* 109  CO2 21* 19* 21*  GLUCOSE 118* 90 91  BUN '8 6 6  '$ CREATININE 0.66 0.67 0.65  CALCIUM 9.6 8.9 8.5*  MG  --  2.0  --     Liver Function Tests: Recent Labs  Lab 10/31/21 1513 11/02/21 0445  AST 11* 10*  ALT 6 8  ALKPHOS 63 51  BILITOT 0.3 0.3  PROT 8.2* 6.7  ALBUMIN 3.6 2.5*    CBG: Recent Labs  Lab 11/01/21 1316 11/01/21 1724 11/01/21 2257  GLUCAP 91 87 97    Microbiology Studies:   Recent Results (from the past 240 hour(s))  Blood culture (routine x 2)     Status: None (Preliminary result)   Collection Time: 10/31/21  3:13 PM   Specimen: BLOOD  Result Value Ref Range Status   Specimen Description   Final    BLOOD LEFT HAND Performed at [facility], [address], [place], [place] [address]    Special Requests   Final    BOTTLES DRAWN AEROBIC AND ANAEROBIC Blood Culture adequate volume Performed at [facility], [address] [place], [place] [address]    Culture   Final    NO GROWTH 2 DAYS Performed at [facility], [place] [address], [place], [place] [address]    Report Status PENDING  Incomplete  Blood culture (routine x 2)     Status: None (Preliminary result)   Collection Time:  10/31/21  4:35 PM   Specimen: BLOOD  Result Value Ref Range Status   Specimen Description   Final    BLOOD RIGHT HAND Performed at [facility], [address] [place], [place] [address]    Special Requests   Final    BOTTLES DRAWN AEROBIC AND ANAEROBIC Blood Culture results may not be optimal due to an inadequate volume of blood received in culture bottles Performed at [facility], [address], [place], [place] [address]    Culture   Final    NO GROWTH 2 DAYS Performed at [facility], [place] [address], [place], [place] [address]    Report Status PENDING  Incomplete    Radiology Studies:  No results found.  Scheduled Meds:    colchicine  0.6 mg Oral Daily   febuxostat  40 mg Oral Daily   pantoprazole  40 mg Oral Daily   prednisoLONE acetate  1 drop Both Eyes QID  Continuous Infusions:    ceFEPime (MAXIPIME) IV 2 g (11/02/21 1359)   lactated ringers 125 mL/hr at 11/02/21 0949   vancomycin 1,250 mg (11/02/21 0631)     LOS: 1 day     Vernell Leep, MD,  FACP, [facility], [facility], [facility] (Care Management Physician Certified) [place]  To contact the attending provider between 7A-7P or the covering provider during after hours 7P-7A, please log into the web site [email or web address] and access using universal [place] password for that web site. If you do not have the password, please call the hospital operator.  11/02/2021, 3:40 PM

## 2021-11-02 NOTE — Plan of Care (Signed)

## 2021-11-03 DIAGNOSIS — I456 Pre-excitation syndrome: Secondary | ICD-10-CM

## 2021-11-03 DIAGNOSIS — L732 Hidradenitis suppurativa: Secondary | ICD-10-CM | POA: Diagnosis not present

## 2021-11-03 DIAGNOSIS — D508 Other iron deficiency anemias: Secondary | ICD-10-CM

## 2021-11-03 DIAGNOSIS — D75839 Thrombocytosis, unspecified: Secondary | ICD-10-CM

## 2021-11-03 LAB — URINALYSIS, ROUTINE W REFLEX MICROSCOPIC
Bilirubin Urine: NEGATIVE
Glucose, UA: NEGATIVE mg/dL
Ketones, ur: NEGATIVE mg/dL
Nitrite: NEGATIVE
Protein, ur: NEGATIVE mg/dL
Specific Gravity, Urine: 1.013 (ref 1.005–1.030)
pH: 6 (ref 5.0–8.0)

## 2021-11-03 LAB — BASIC METABOLIC PANEL
Anion gap: 7 (ref 5–15)
BUN: 6 mg/dL (ref 6–20)
CO2: 21 mmol/L — ABNORMAL LOW (ref 22–32)
Calcium: 8.6 mg/dL — ABNORMAL LOW (ref 8.9–10.3)
Chloride: 110 mmol/L (ref 98–111)
Creatinine, Ser: 0.73 mg/dL (ref 0.44–1.00)
GFR, Estimated: >60 mL/min (ref >60)
Glucose, Bld: 87 mg/dL (ref 70–99)
Potassium: 3.4 mmol/L — ABNORMAL LOW (ref 3.5–5.1)
Sodium: 138 mmol/L (ref 135–145)

## 2021-11-03 LAB — CBC
HCT: 31.2 % — ABNORMAL LOW (ref 36.0–46.0)
Hemoglobin: 9.6 g/dL — ABNORMAL LOW (ref 12.0–15.0)
MCH: 24.5 pg — ABNORMAL LOW (ref 26.0–34.0)
MCHC: 30.8 g/dL (ref 30.0–36.0)
MCV: 79.6 fL — ABNORMAL LOW (ref 80.0–100.0)
Platelets: 497 10*3/uL — ABNORMAL HIGH (ref 150–400)
RBC: 3.92 MIL/uL (ref 3.87–5.11)
RDW: 20 % — ABNORMAL HIGH (ref 11.5–15.5)
WBC: 7.5 10*3/uL (ref 4.0–10.5)
nRBC: 0 % (ref 0.0–0.2)

## 2021-11-03 MED ORDER — CLINDAMYCIN PHOSPHATE 1 % EX SOLN
Freq: Two times a day (BID) | CUTANEOUS | Status: DC
  Filled 2021-11-03: qty 30

## 2021-11-03 MED ORDER — HYDROMORPHONE HCL 1 MG/ML IJ SOLN
0.5000 mg | INTRAMUSCULAR | Status: DC | PRN
  Administered 2021-11-03 (×2): 1 mg via INTRAVENOUS
  Administered 2021-11-04: 0.5 mg via INTRAVENOUS
  Administered 2021-11-04: 1 mg via INTRAVENOUS
  Administered 2021-11-04: 0.5 mg via INTRAVENOUS
  Administered 2021-11-04 – 2021-11-05 (×2): 1 mg via INTRAVENOUS
  Filled 2021-11-03 (×7): qty 1

## 2021-11-03 MED ORDER — OXYCODONE HCL 5 MG PO TABS
5.0000 mg | ORAL_TABLET | ORAL | Status: DC | PRN
  Administered 2021-11-03 – 2021-11-05 (×2): 5 mg via ORAL
  Filled 2021-11-03 (×2): qty 1

## 2021-11-03 MED ORDER — POTASSIUM CHLORIDE CRYS ER 20 MEQ PO TBCR
40.0000 meq | EXTENDED_RELEASE_TABLET | Freq: Once | ORAL | Status: AC
  Administered 2021-11-03: 40 meq via ORAL
  Filled 2021-11-03: qty 2

## 2021-11-03 NOTE — Progress Notes (Signed)
  Transition of Care [facility]) Screening Note   Patient Details  Name: Amanda Davenport Date of Birth: 1981-02-10   Transition of Care [facility]) CM/SW Contact:    [name], RN Phone Number: 11/03/2021, 3:40 PM    Transition of Care Department [facility]) has reviewed patient and no TOC needs have been identified at this time. We will continue to monitor patient advancement through interdisciplinary progression rounds. If new patient transition needs arise, please place a TOC consult.

## 2021-11-03 NOTE — Progress Notes (Addendum)
PROGRESS NOTE   Amanda DELAINE  [place]:[id number]    DOB: 05/03/1980    DOA: 10/31/2021  PCP: Cipriano Mile, NP   I have briefly reviewed patients previous medical records in [facility]  Chief Complaint  Patient presents with   Dizziness   Tachycardia    Brief Narrative:  41 year old female with PMH of prediabetes, WPW, obesity, anxiety and depression, recurrent boils, hidradenitis suppurative, s/p excision with flap closure of hidradenitis from the left axilla in 2021, right hydradenitis and sepsis in January 2023 following trial of Humira with [facility] dermatology, presented to the ED with complaints of headache, fatigue, dyspnea, nausea and diarrhea.  She was on doxycycline PTA for her hidradenitis and now presented with multiple of these draining purulent foul-smelling discharge.  Admitted for hidradenitis suppurativa flare, treating supportively with broad-spectrum IV antibiotics.  General surgery consulted.  Improving.  Assessment & Plan:  Principal Problem:   Hidradenitis suppurativa Active Problems:   Iron deficiency anemia   Wolff-Parkinson-White (WPW) syndrome   Hydradenitis   Abscess   Class 2 obesity   Prediabetes   Thrombocytosis   Hidradenitis suppurativa flare: Prior surgical history as noted above.  Patient has extensive involvement with hidradenitis involving the right axilla, left inframammary crease and the perineum.  Follows with Dr. Romana Juniper, CCS with plans for surgery on 12/05/2021.  Followed by her dermatologist in [place].  Admitted for multiple areas draining purulent drainage.  Remains on broad-spectrum IV antibiotics, cefepime and vancomycin.  General surgery consultation appreciated.  Discussed with Dr. Harlow Asa who recommended continuing IV antibiotics, wound care with frequent dry dressing changes and cleansing with antibacterial soap, no surgical interventions recommended at this time.  He will have Dr. Romana Juniper and plastic surgery to  evaluate.  Blood cultures x2: Negative to date.  Leukocytosis resolved.  Personally examined all lesions in the presence of the nursing team while they were doing their dressing changes on 7/31, look improved and not draining.  Pictures as below.  Iron deficiency anemia: Stable.  Follow CBC periodically and transfuse for hemoglobin 7 g or less.  Thrombocytosis: Reactive due to infectious etiology above and iron deficiency.  Improved.  WPW syndrome: Continue Toprol-XL 25 Mg daily.  Prediabetes: A1c 5.3.  No active inpatient management.  Hypokalemia: Replaced.  Follow BMP and magnesium in AM.  Body mass index is 38.84 kg/m./Obesity.   DVT prophylaxis: SCDs Start: 11/01/21 1113     Code Status: Full Code:  Family Communication: None at bedside Disposition:  Status is: Inpatient Remains inpatient appropriate because: IV antibiotics   DC when CCS clears.  Hopefully in the next 1 to 2 days on oral antibiotics unless they plan to surgically intervene.  Consultants:   General surgery  Procedures:     Antimicrobials:   As noted above   Subjective:  Overall feels better.  Drainage from sites as improved.  Pain controlled.  States that in the past 2 when she has been transitioned from IV antibiotics to oral antibiotics and discharged home, she has gotten worse.  Objective:   Vitals:   11/02/21 1230 11/02/21 2229 11/03/21 0626 11/03/21 1130  BP: 115/88 115/66 103/64 112/81  Pulse: 71 65 61 69  Resp: '20 18 20   '$ Temp: 98.2 F (36.8 C) 98.1 F (36.7 C) 97.9 F (36.6 C)   TempSrc:   Oral   SpO2: 100% 100% 98%   Weight:      Height:       Examined patient with  patient's female RN and 3 other female nursing techs at bedside who are assisting her with dressing change.  General exam: Young female, moderately built and obese sitting up at bedside commode and then steadily stood up next to her bed. Respiratory system: Clear to auscultation.  No increased work of  breathing. Cardiovascular system: S1 & S2 heard, RRR. No JVD, murmurs, rubs, gallops or clicks. No pedal edema.  Telemetry personally reviewed: Sinus rhythm with BBB morphology.  Discontinued telemetry. Gastrointestinal system: Abdomen is nondistended, soft and nontender. No organomegaly or masses felt. Normal bowel sounds heard. Central nervous system: Alert and oriented. No focal neurological deficits. Extremities: Symmetric 5 x 5 power. Skin: Multiple hidradenitis lesions in right axilla, underneath left breast, between bilateral buttocks and groin area.  Sinus tracts, induration, mild tenderness, not much drainage.  No fluctuation or abscess.  Details and pictures below from 7/31. Psychiatry: Judgement and insight appear normal. Mood & affect appropriate.   R Axilla 11/03/2021   Underneath L Breast 11/03/2021   Between Buttocks 11/03/2021   Groin Area 11/03/2021    Data Reviewed:   I have personally reviewed following labs and imaging studies   CBC: Recent Labs  Lab 10/31/21 1513 11/01/21 1207 11/02/21 0758 11/03/21 0444  WBC 12.7* 10.2 9.2 7.5  NEUTROABS 9.1*  --  5.6  --   HGB 10.4* 10.0* 9.3* 9.6*  HCT 33.7* 32.0* 30.4* 31.2*  MCV 77.8* 77.9* 79.8* 79.6*  PLT 589* 540* 467* 497*    Basic Metabolic Panel: Recent Labs  Lab 10/31/21 1513 11/01/21 1207 11/02/21 0445 11/03/21 0444  NA 136 139 138 138  K 3.5 3.7 3.5 3.4*  CL 103 112* 109 110  CO2 21* 19* 21* 21*  GLUCOSE 118* 90 91 87  BUN '8 6 6 6  '$ CREATININE 0.66 0.67 0.65 0.73  CALCIUM 9.6 8.9 8.5* 8.6*  MG  --  2.0  --   --     Liver Function Tests: Recent Labs  Lab 10/31/21 1513 11/02/21 0445  AST 11* 10*  ALT 6 8  ALKPHOS 63 51  BILITOT 0.3 0.3  PROT 8.2* 6.7  ALBUMIN 3.6 2.5*    CBG: Recent Labs  Lab 11/01/21 1316 11/01/21 1724 11/01/21 2257  GLUCAP 91 87 97    Microbiology Studies:   Recent Results (from the past 240 hour(s))  Blood culture (routine x 2)     Status: None  (Preliminary result)   Collection Time: 10/31/21  3:13 PM   Specimen: BLOOD  Result Value Ref Range Status   Specimen Description   Final    BLOOD LEFT HAND Performed at [facility], [address], [place], [place] [address]    Special Requests   Final    BOTTLES DRAWN AEROBIC AND ANAEROBIC Blood Culture adequate volume Performed at [facility], [address] [place], [place] [address]    Culture   Final    NO GROWTH 3 DAYS Performed at [facility], [place] [address] [place], [place] [address]    Report Status PENDING  Incomplete  Blood culture (routine x 2)     Status: None (Preliminary result)   Collection Time: 10/31/21  4:35 PM   Specimen: BLOOD  Result Value Ref Range Status   Specimen Description   Final    BLOOD RIGHT HAND Performed at [facility], [address], [place],  [address]    Special Requests   Final    BOTTLES DRAWN AEROBIC AND  ANAEROBIC Blood Culture results may not be optimal due to an inadequate volume of blood received in culture bottles Performed at [facility], [address], [place], [place] [address]    Culture   Final    NO GROWTH 3 DAYS Performed at [facility], [place] [address], [place], [place] [address]    Report Status PENDING  Incomplete    Radiology Studies:  No results found.  Scheduled Meds:    colchicine  0.6 mg Oral Daily   febuxostat  40 mg Oral Daily   metoprolol succinate  25 mg Oral Daily   pantoprazole  40 mg Oral Daily   prednisoLONE acetate  1 drop Both Eyes QID    Continuous Infusions:    ceFEPime (MAXIPIME) IV 2 g (11/03/21 0530)   vancomycin 1,250 mg (11/03/21 0622)     LOS: 2 days     Vernell Leep, MD,  FACP, [facility], [facility], [facility] (Care Management Physician Certified) [place]  To contact the attending provider between 7A-7P or the covering provider during after  hours 7P-7A, please log into the web site [email or web address] and access using universal [place] password for that web site. If you do not have the password, please call the hospital operator.  11/03/2021, 12:06 PM

## 2021-11-03 NOTE — Progress Notes (Signed)
Progress Note     Subjective: Patient with pain in R axilla and left breast more than anywhere else. Less pain from gluteal cleft but still having drainage.   Objective: Vital signs in last 24 hours: Temp:  [97.9 F (36.6 C)-98.2 F (36.8 C)] 97.9 F (36.6 C) (07/31 0626) Pulse Rate:  [61-71] 61 (07/31 0626) Resp:  [18-20] 20 (07/31 0626) BP: (103-115)/(64-88) 103/64 (07/31 0626) SpO2:  [98 %-100 %] 98 % (07/31 0626) Last BM Date : 11/01/21  Intake/Output from previous day: 07/30 0701 - 07/31 0700 In: 2302.3 [P.O.:480; I.V.:1372.3; IV Piggyback:450] Out: 400 [Urine:400] Intake/Output this shift: Total I/O In: 100 [P.O.:100] Out: -   PE: General: pleasant, WD, overweight female who is laying in bed in NAD Skin: R axilla as pictured below without clear abscess but some purulent drainage  L inframammary fold as pictured below with some purulent drainage but no fluctuant abscess  Gluteal cleft with some purulent drainage but no fluctuant undrained abscess      Lab Results:  Recent Labs    11/02/21 0758 11/03/21 0444  WBC 9.2 7.5  HGB 9.3* 9.6*  HCT 30.4* 31.2*  PLT 467* 497*   BMET Recent Labs    11/02/21 0445 11/03/21 0444  NA 138 138  K 3.5 3.4*  CL 109 110  CO2 21* 21*  GLUCOSE 91 87  BUN 6 6  CREATININE 0.65 0.73  CALCIUM 8.5* 8.6*   PT/INR No results for input(s): "LABPROT", "INR" in the last 72 hours. CMP     Component Value Date/Time   NA 138 11/03/2021 0444   NA 139 12/06/2019 1136   NA 139 12/18/2014 1518   K 3.4 (L) 11/03/2021 0444   K 4.0 12/18/2014 1518   CL 110 11/03/2021 0444   CO2 21 (L) 11/03/2021 0444   CO2 25 12/18/2014 1518   GLUCOSE 87 11/03/2021 0444   GLUCOSE 90 12/18/2014 1518   BUN 6 11/03/2021 0444   BUN 12 12/06/2019 1136   BUN 9.4 12/18/2014 1518   CREATININE 0.73 11/03/2021 0444   CREATININE 0.85 07/28/2021 1438   CREATININE 0.61 07/15/2016 1039   CREATININE 0.7 12/18/2014 1518   CALCIUM 8.6 (L)  11/03/2021 0444   CALCIUM 8.9 12/18/2014 1518   PROT 6.7 11/02/2021 0445   PROT 8.5 12/06/2019 1136   PROT 8.8 (H) 12/18/2014 1518   ALBUMIN 2.5 (L) 11/02/2021 0445   ALBUMIN 3.9 12/06/2019 1136   ALBUMIN 3.1 (L) 12/18/2014 1518   AST 10 (L) 11/02/2021 0445   AST 9 (L) 07/28/2021 1438   AST 11 12/18/2014 1518   ALT 8 11/02/2021 0445   ALT 7 07/28/2021 1438   ALT 8 12/18/2014 1518   ALKPHOS 51 11/02/2021 0445   ALKPHOS 80 12/18/2014 1518   BILITOT 0.3 11/02/2021 0445   BILITOT 0.3 07/28/2021 1438   BILITOT 0.30 12/18/2014 1518   GFRNONAA >60 11/03/2021 0444   GFRNONAA >60 07/28/2021 1438   GFRNONAA >89 07/15/2016 1039   GFRAA 99 12/06/2019 1136   GFRAA >89 07/15/2016 1039   Lipase     Component Value Date/Time   LIPASE 11 10/31/2021 1513       Studies/Results: No results found.  Anti-infectives: Anti-infectives (From admission, onward)    Start     Dose/Rate Route Frequency Ordered Stop   11/01/21 1800  vancomycin (VANCOREADY) IVPB 1250 mg/250 mL        1,250 mg 166.7 mL/hr over 90 Minutes Intravenous Every 12 hours 11/01/21 1207  11/01/21 0900  vancomycin (VANCOCIN) IVPB 1000 mg/200 mL premix  Status:  Discontinued        1,000 mg 200 mL/hr over 60 Minutes Intravenous Every 8 hours 11/01/21 0844 11/01/21 1207   11/01/21 0700  vancomycin (VANCOREADY) IVPB 1250 mg/250 mL  Status:  Discontinued        1,250 mg 166.7 mL/hr over 90 Minutes Intravenous Every 12 hours 10/31/21 1808 11/01/21 0844   10/31/21 1800  vancomycin (VANCOCIN) IVPB 1000 mg/200 mL premix       See Hyperspace for full Linked Orders Report.   1,000 mg 200 mL/hr over 60 Minutes Intravenous  Once 10/31/21 1604 10/31/21 2031   10/31/21 1615  ceFEPIme (MAXIPIME) 2 g in sodium chloride 0.9 % 100 mL IVPB        2 g 200 mL/hr over 30 Minutes Intravenous Every 8 hours 10/31/21 1604     10/31/21 1615  vancomycin (VANCOCIN) IVPB 1000 mg/200 mL premix       See Hyperspace for full Linked Orders Report.    1,000 mg 200 mL/hr over 60 Minutes Intravenous  Once 10/31/21 1604 10/31/21 1805        Assessment/Plan Hidradenitis suppurativa  Pilonidal cyst vs involvement of above disease process - no leukocytosis and afebrile - no fluctuant or undrained area to I&D presently - would recommend abx both PO upon discharge/IV while admitted and can consider topical clindamycin - local wound care and cleansing with antibacterial soap - follow up with Dr. Kae Heller and plastics  - with no indications for acute surgical intervention, general surgery will sign off but please let us know if we can be of further assistance   FEN: reg diet, SLIV VTE: SCDs ID: [email or web address]  LOS: 2 days   I reviewed hospitalist notes, last 24 h vitals and pain scores, last 48 h intake and output, and last 24 h labs and trends.     [name], [facility] 11/03/2021, 9:26 AM Please see Amion for pager number during day hours 7:00am-4:30pm

## 2021-11-03 NOTE — Plan of Care (Signed)

## 2021-11-04 DIAGNOSIS — D508 Other iron deficiency anemias: Secondary | ICD-10-CM | POA: Diagnosis not present

## 2021-11-04 DIAGNOSIS — I456 Pre-excitation syndrome: Secondary | ICD-10-CM | POA: Diagnosis not present

## 2021-11-04 DIAGNOSIS — L732 Hidradenitis suppurativa: Secondary | ICD-10-CM | POA: Diagnosis not present

## 2021-11-04 DIAGNOSIS — D75839 Thrombocytosis, unspecified: Secondary | ICD-10-CM | POA: Diagnosis not present

## 2021-11-04 LAB — BASIC METABOLIC PANEL
Anion gap: 5 (ref 5–15)
BUN: 6 mg/dL (ref 6–20)
CO2: 24 mmol/L (ref 22–32)
Calcium: 8.7 mg/dL — ABNORMAL LOW (ref 8.9–10.3)
Chloride: 108 mmol/L (ref 98–111)
Creatinine, Ser: 0.59 mg/dL (ref 0.44–1.00)
GFR, Estimated: >60 mL/min (ref >60)
Glucose, Bld: 95 mg/dL (ref 70–99)
Potassium: 3.7 mmol/L (ref 3.5–5.1)
Sodium: 137 mmol/L (ref 135–145)

## 2021-11-04 LAB — MAGNESIUM: Magnesium: 1.9 mg/dL (ref 1.7–2.4)

## 2021-11-04 MED ORDER — CHLORHEXIDINE GLUCONATE 4 % EX LIQD
Freq: Every day | CUTANEOUS | Status: DC | PRN
  Filled 2021-11-04 (×4): qty 15

## 2021-11-04 NOTE — Care Management Important Message (Signed)
Important Message  Patient Details IM Letter given to the Patient. Name: Amanda Davenport MRN: [id number] Date of Birth: 1980-04-29   Medicare Important Message Given:  Yes     Kerin Salen 11/04/2021, 10:48 AM

## 2021-11-04 NOTE — Progress Notes (Addendum)
PROGRESS NOTE   Amanda Davenport  WCB:[id number]    DOB: 22-Sep-1980    DOA: 10/31/2021  PCP: Cipriano Mile, NP   I have briefly reviewed patients previous medical records in [facility].  Chief Complaint  Patient presents with   Dizziness   Tachycardia    Brief Narrative:  40 year old female with PMH of prediabetes, WPW, obesity, anxiety and depression, recurrent boils, hidradenitis suppurative, s/p excision with flap closure of hidradenitis from the left axilla in 2021, right hydradenitis and sepsis in January 2023 following trial of Humira with [facility] dermatology, presented to the ED with complaints of headache, fatigue, dyspnea, nausea and diarrhea.  She was on doxycycline PTA for her hidradenitis and now presented with multiple of these draining purulent foul-smelling discharge.  Admitted for hidradenitis suppurativa flare, treating supportively with broad-spectrum IV antibiotics.  Improving.  General surgery consulted and signed off 7/31.  ID consulted 8/1.  Assessment & Plan:  Principal Problem:   Hidradenitis suppurativa Active Problems:   Iron deficiency anemia   Wolff-Parkinson-White (WPW) syndrome   Hydradenitis   Abscess   Class 2 obesity   Prediabetes   Thrombocytosis   Hidradenitis suppurativa flare: Prior surgical history as noted above.  Patient has extensive involvement with hidradenitis involving the right axilla, left inframammary crease and the perineum.  Follows with Dr. Romana Juniper, CCS with plans for surgery on 12/05/2021.  Followed by her dermatologist in [place].  Admitted for multiple areas draining purulent drainage.  Remains on broad-spectrum IV antibiotics, cefepime and vancomycin (day 5 of?  Days) and multimodality pain control.  General surgery consulted, recommended wound care, no surgical intervention, outpatient follow-up with Dr Windle Guard (CCS) and Dr. Iran Planas (plastic surgery) and signed off 7/31.  Has remained afebrile, leukocytosis  resolved, blood cultures negative to date.  As examined yesterday, wounds with some not copious drainage.  Discussed with patient that it is reasonable for her to transition to oral antibiotics and discharged home but she seemed upset by this.  She indicated that, even in the past, when she was discharged home after transitioning from IV to oral antibiotics, she has gotten worse prompting return to the hospital.  Infectious disease consulted on 8/1 for assistance with antimicrobial management.  TOC consulted for wound care clinic follow-up as outpatient and may be home health RN for assistance with wound management.  Patient also reports that she follows with [facility] dermatology but does not appear to have seen them since April 2023.  Discussed at length with patient's PCP, she indicated that their office was trying to arrange for [facility] and other resources for home management.  She was also considering starting patient on spironolactone for this.  Iron deficiency anemia: Stable.  Follow CBC periodically and transfuse for hemoglobin 7 g or less.  Thrombocytosis: Reactive due to infectious etiology above and iron deficiency.  Improved.  WPW syndrome: Continue Toprol-XL 25 Mg daily.  Prediabetes: A1c 5.3.  No active inpatient management.  Hypokalemia: Replaced.  Magnesium 1.9.  Idiopathic chronic gout of multiple sites without tophus: Follows with Dr. Luberta Robertson, rheumatology at [facility], continue outpatient follow-up.  It does not appear that she is on any active treatment for this PTA but on colchicine and Uloric here.  Reports painful right little finger, does not appear acute.  No acute findings of right knee where she reports pain.  Body mass index is 38.84 kg/m./Obesity.   DVT prophylaxis: SCDs Start: 11/01/21 1113  Code Status: Full Code:  Family Communication: None at bedside Disposition:  Status is: Inpatient Remains  inpatient appropriate because: IV antibiotics, awaiting ID input for further recommendations.     Consultants:   General surgery-signed off 7/31 Infectious disease  Procedures:     Antimicrobials:   As noted above   Subjective:  Reports that her pain from lesions not adequately controlled, reports ongoing drainage.  Also pain of right little finger for 3 weeks and right knee pain.  Rest of history as noted above, specifically does not feel that she is medically ready to be transitioned from IV to oral antibiotics and discharged home.  Objective:   Vitals:   11/03/21 2003 11/04/21 0522 11/04/21 0931 11/04/21 1228  BP: 114/67 (!) 101/59 105/62 103/80  Pulse: 63 74 72 79  Resp: '18 20  18  '$ Temp: 98.5 F (36.9 C) 98.3 F (36.8 C)  98.4 F (36.9 C)  TempSrc: Oral Oral  Oral  SpO2: 97% 100%  100%  Weight:      Height:        General exam: Young female, moderately built and obese lying comfortably supine in bed, texting on her phone, almost throughout my visit with her, interview and exam.  Did not appear in any pain or discomfort. Respiratory system: This was as examined yesterday, clear to auscultation.  No increased work of breathing noted.  Did not examine today. Cardiovascular system: This was as examined yesterday, S1 & S2 heard, RRR. No JVD, murmurs, rubs, gallops or clicks. No pedal edema.  No longer on telemetry.  Did not examine today. Gastrointestinal system: As examined yesterday, abdomen is nondistended, soft and nontender. No organomegaly or masses felt. Normal bowel sounds heard.  Did not examine today. Central nervous system: Alert and oriented. No focal neurological deficits. Extremities: Symmetric 5 x 5 power.  Right little finger seems to have flexed deformity at the first interphalangeal joint with some swelling, darkness in this area.  No redness or increased warmth.  Somewhat tender to touch.  Right knee unremarkable without any acute findings. Skin: As examined  on 7/31 (did not examine today): Multiple hidradenitis lesions in right axilla, underneath left breast, between bilateral buttocks and groin area.  Sinus tracts, induration, mild tenderness, not much drainage.  No fluctuation or abscess.  Details and pictures below from 7/31. Psychiatry: Judgement and insight appear normal. Mood & affect appropriate.   R Axilla 11/03/2021   Underneath L Breast 11/03/2021   Between Buttocks 11/03/2021   Groin Area 11/03/2021    Data Reviewed:   I have personally reviewed following labs and imaging studies   CBC: Recent Labs  Lab 10/31/21 1513 11/01/21 1207 11/02/21 0758 11/03/21 0444  WBC 12.7* 10.2 9.2 7.5  NEUTROABS 9.1*  --  5.6  --   HGB 10.4* 10.0* 9.3* 9.6*  HCT 33.7* 32.0* 30.4* 31.2*  MCV 77.8* 77.9* 79.8* 79.6*  PLT 589* 540* 467* 497*    Basic Metabolic Panel: Recent Labs  Lab 10/31/21 1513 11/01/21 1207 11/02/21 0445 11/03/21 0444 11/04/21 0340  NA 136 139 138 138 137  K 3.5 3.7 3.5 3.4* 3.7  CL 103 112* 109 110 108  CO2 21* 19* 21* 21* 24  GLUCOSE 118* 90 91 87 95  BUN '8 6 6 6 6  '$ CREATININE 0.66 0.67 0.65 0.73 0.59  CALCIUM 9.6 8.9 8.5* 8.6* 8.7*  MG  --  2.0  --   --  1.9    Liver Function Tests: Recent  Labs  Lab 10/31/21 1513 11/02/21 0445  AST 11* 10*  ALT 6 8  ALKPHOS 63 51  BILITOT 0.3 0.3  PROT 8.2* 6.7  ALBUMIN 3.6 2.5*    CBG: Recent Labs  Lab 11/01/21 1316 11/01/21 1724 11/01/21 2257  GLUCAP 91 87 97    Microbiology Studies:   Recent Results (from the past 240 hour(s))  Blood culture (routine x 2)     Status: None (Preliminary result)   Collection Time: 10/31/21  3:13 PM   Specimen: BLOOD  Result Value Ref Range Status   Specimen Description   Final    BLOOD LEFT HAND Performed at [facility], [address] [place], [place] [address]    Special Requests   Final    BOTTLES DRAWN AEROBIC AND ANAEROBIC Blood Culture adequate volume Performed at [facility], [address] [place], [place] [address]    Culture   Final    NO GROWTH 4 DAYS Performed at [facility], [place] [address] [place], [place] [address]    Report Status PENDING  Incomplete  Blood culture (routine x 2)     Status: None (Preliminary result)   Collection Time: 10/31/21  4:35 PM   Specimen: BLOOD  Result Value Ref Range Status   Specimen Description   Final    BLOOD RIGHT HAND Performed at [facility], [address] [place], [place] [address]    Special Requests   Final    BOTTLES DRAWN AEROBIC AND ANAEROBIC Blood Culture results may not be optimal due to an inadequate volume of blood received in culture bottles Performed at [facility], [address] [place], [place] [address]    Culture   Final    NO GROWTH 4 DAYS Performed at [facility], [place] [address], [place], [place] [address]    Report Status PENDING  Incomplete    Radiology Studies:  No results found.  Scheduled Meds:    clindamycin   Topical BID   colchicine  0.6 mg Oral Daily   febuxostat  40 mg Oral Daily   metoprolol succinate  25 mg Oral Daily   pantoprazole  40 mg Oral Daily   prednisoLONE acetate  1 drop Both Eyes QID    Continuous Infusions:    ceFEPime (MAXIPIME) IV 2 g (11/04/21 1257)   vancomycin 1,250 mg (11/04/21 0606)     LOS: 3 days     Vernell Leep, MD,  FACP, [facility], [facility], [facility] (Care Management Physician Certified) [place]  To contact the attending provider between 7A-7P or the covering provider during after hours 7P-7A, please log into the web site [email or web address] and access using universal [place] password for that web site. If you do not have the password, please call the hospital operator.  11/04/2021, 2:52 PM

## 2021-11-04 NOTE — TOC Initial Note (Signed)
Transition of Care [facility]) - Initial/Assessment Note    Patient Details  Name: Amanda Davenport MRN: [id number] Date of Birth: 05-20-1980  Transition of Care [facility]) CM/SW Contact:    [name], RN Phone Number: 11/04/2021, 3:42 PM  Clinical Narrative: Referral for [facility] nursing, & wound care clinic appt-patient states she will discuss w/her pcp since pcp was already coordinating her [place], & wound care clinic appt. Continue to monitor for other d/c needs.                  Expected Discharge Plan: Home/Self Care Barriers to Discharge: Continued Medical Work up   Patient Goals and CMS Choice Patient states their goals for this hospitalization and ongoing recovery are:: Home CMS Medicare.gov Compare Post Acute Care list provided to:: Patient Choice offered to / list presented to : Patient  Expected Discharge Plan and Services Expected Discharge Plan: Home/Self Care   Discharge Planning Services: CM Consult Post Acute Care Choice: NA Living arrangements for the past 2 months: Apartment                                      Prior Living Arrangements/Services Living arrangements for the past 2 months: Apartment Lives with:: Self Patient language and need for interpreter reviewed:: Yes Do you feel safe going back to the place where you live?: Yes      Need for Family Participation in Patient Care: Yes (Comment) Care giver support system in place?: Yes (comment)   Criminal Activity/Legal Involvement Pertinent to Current Situation/Hospitalization: No - Comment as needed  Activities of Daily Living Home Assistive Devices/Equipment: Walker (specify type) ADL Screening (condition at time of admission) Patient's cognitive ability adequate to safely complete daily activities?: Yes Is the patient deaf or have difficulty hearing?: No Does the patient have difficulty seeing, even when wearing glasses/contacts?: No Does the patient have difficulty concentrating, remembering, or making  decisions?: No Patient able to express need for assistance with ADLs?: Yes Does the patient have difficulty dressing or bathing?: Yes Independently performs ADLs?: No Communication: Needs assistance Is this a change from baseline?: Pre-admission baseline Dressing (OT): Needs assistance Is this a change from baseline?: Pre-admission baseline Grooming: Needs assistance Is this a change from baseline?: Pre-admission baseline Feeding: Needs assistance Is this a change from baseline?: Pre-admission baseline Bathing: Needs assistance Is this a change from baseline?: Pre-admission baseline Toileting: Needs assistance Is this a change from baseline?: Pre-admission baseline In/Out Bed: Needs assistance Is this a change from baseline?: Pre-admission baseline Walks in Home: Needs assistance Is this a change from baseline?: Pre-admission baseline Does the patient have difficulty walking or climbing stairs?: Yes Weakness of Legs: None Weakness of Arms/Hands: Both  Permission Sought/Granted Permission sought to share information with : Case Manager Permission granted to share information with : Yes, Verbal Permission Granted  Share Information with NAME: Case manager           Emotional Assessment Appearance:: Appears stated age Attitude/Demeanor/Rapport: Gracious Affect (typically observed): Accepting Orientation: : Oriented to Self, Oriented to Place, Oriented to Situation   Psych Involvement: No (comment)  Admission diagnosis:  Abscess [L02.91] H/O hidradenitis suppurativa [Z87.2] Sepsis, due to unspecified organism, unspecified whether acute organ dysfunction present [facility]) [A41.9] Patient Active Problem List   Diagnosis Date Noted   Class 2 obesity 11/01/2021   Prediabetes 11/01/2021   Thrombocytosis 11/01/2021   Abscess 05/07/2021   Sepsis due  to cellulitis ([place]) 05/06/2021   Menorrhagia 12/28/2020   COVID-19 virus infection 04/27/2020   Gout 04/27/2020   Generalized  abdominal pain 03/15/2019   Neuropathy 12/27/2018   Ovarian cyst 04/29/2018   Facial cellulitis    Otitis externa    Dental abscess 11/27/2016   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   Absolute anemia 05/21/2015   Neck strain 12/11/2014   Hydradenitis 12/11/2014   Dental caries 12/11/2014   Constipation 12/03/2014   Neck pain 12/03/2014   Myalgia and myositis 11/27/2014   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   Morbid obesity ([place]) 11/27/2014   Hematochezia 12/16/2012   Wolff-Parkinson-White (WPW) syndrome 03/01/2012   Iron deficiency anemia 02/16/2012   Tachycardia 01/28/2012   Hidradenitis suppurativa 08/18/2011   AXILLARY ABSCESS 12/30/2006   ANKLE PAIN 10/11/2006   PCP:  Cipriano Mile, NP Pharmacy:   [facility] [place], [address] [place] [address] Phone: [phone] Fax: [place], [address] [place] [address] Phone: [phone] Fax: [phone]     Social Determinants of Health (SDOH) Interventions    Readmission Risk Interventions     No data to display

## 2021-11-04 NOTE — Consult Note (Signed)
[facility] for Infectious Disease    Date of Admission:  10/31/2021     Reason for Consult: HS Flare     Referring Physician: Dr Algis Liming  Current antibiotics: Vancomycin and Cefepime 7/28 -- present  ASSESSMENT:    41 y.o. female admitted with:  HS flare: Extensive involvement with HS involving right axilla, left inframammary crease and perineum.  She follows with derm and rheum at [facility] as well as surgery with Dr Kae Heller and plastics.  Planning for excision of right adnexa on 12/05/21.  Her lesions appear to be improved this admission currently on day 5 of broad antibiotics.  Not sure how effective or necessary ongoing antibiotics are at this point vs trying to target her underlying dermatologic/rheumatologic issue.   RECOMMENDATIONS:    Will continue current antibiotics of cefepime and vancomycin today Tomorrow will discuss with her switching to linezolid vs 1 time dose of oritavancin to cover for possible secondary infection in anticipation of hopeful discharge at that time with needed follow up at [facility] with her other specialist Will follow   Principal Problem:   Hidradenitis suppurativa Active Problems:   Iron deficiency anemia   Wolff-Parkinson-White (WPW) syndrome   Hydradenitis   Abscess   Class 2 obesity   Prediabetes   Thrombocytosis   MEDICATIONS:    Scheduled Meds:  clindamycin   Topical BID   colchicine  0.6 mg Oral Daily   febuxostat  40 mg Oral Daily   metoprolol succinate  25 mg Oral Daily   pantoprazole  40 mg Oral Daily   prednisoLONE acetate  1 drop Both Eyes QID   Continuous Infusions:  ceFEPime (MAXIPIME) IV 2 g (11/04/21 1257)   vancomycin 1,250 mg (11/04/21 0606)   PRN Meds:.acetaminophen **OR** acetaminophen, chlorhexidine, HYDROmorphone (DILAUDID) injection, ondansetron **OR** ondansetron (ZOFRAN) IV, oxyCODONE, traZODone  HPI:    Amanda Davenport is a 41 y.o. female with complicated history of HS and recurrent boils.  She is s/p  excision with flap closure of HS from left axilla in 2021.  Status post right HS flare up in January 2023 following trial of Humira.  She follows with [facility] dermatology and rheumatology as an outpatient.  She was previously on regimen of cipro, flagyl, rifampin earlier this year but did not tolerate due to GI upset.  She was then on clindamycin alone until about 2 weeks ago when she started having some pain and drainage from her axilla.  She was placed on doxycycline but continued to have worsened pain and drainage.  She presented here and has been on broad spectrum antibiotics since admission with improvement.  Her wound on backside is having some bleeding but no purulence per patients nurse although patient reports there is still ongoing purulent drainage.  Her cultures are negative. Surgery has also seen her and recommends she follow up with her outpatient care team with no inpatient surgical needs identified.    Past Medical History:  Diagnosis Date   Allergy    Anemia    receives transfusions periodically   Anxiety    Arrhythmia    Chronic headache    Depression    Dysrhythmia    Gout 12/2018   Knee pain    Nearsightedness    wears glasses   Neuropathy    Obesity    Pneumonia    Pre-diabetes    Recurrent boils    WPW (Wolff-Parkinson-White syndrome)     Social History   Tobacco Use   Smoking  status: Former    Years: 0.50    Types: Cigarettes, Cigars    Quit date: 04/30/2019    Years since quitting: 2.5   Smokeless tobacco: Never   Tobacco comments:    smokes black and milds - last use early-mid August  Vaping Use   Vaping Use: Never used  Substance Use Topics   Alcohol use: No   Drug use: Yes    Frequency: 7.0 times per week    Types: Marijuana    Family History  Problem Relation Age of Onset   Breast cancer Mother    Pulmonary embolism Mother        died of PE   Colon cancer Mother    Irritable bowel syndrome Mother    Cancer Mother    Hypertension  Father    Diabetes Paternal Grandmother    Heart disease Neg Hx    Stroke Neg Hx     Allergies  Allergen Reactions   Other Other (See Comments)    All Antibiotics cause severe vaginal yeast infections   Penicillins Hives, Itching, Swelling and Rash    Has patient had a PCN reaction causing immediate rash, facial/tongue/throat swelling, SOB or lightheadedness with hypotension: Yes Has patient had a PCN reaction causing severe rash involving mucus membranes or skin necrosis: Yes Has patient had a PCN reaction that required hospitalization Yes Has patient had a PCN reaction occurring within the last 10 years: Yes If all of the above answers are "NO", then may proceed with Cephalosporin use.  Has patient had a PCN reaction causing immediate rash, facial/tongue/throat swelling, SOB or lightheadedness with hypotension: Yes Has patient had a PCN reaction causing severe rash involving mucus membranes or skin necrosis: Yes Has patient had a PCN reaction that required hospitalization Yes Has patient had a PCN reaction occurring within the last 10 years: Yes If all of the above answers are "NO", then may proceed with Cephalosporin use. Has patient had a PCN reaction causing immediate rash, facial/tongue/throat swelling, SOB or lightheadedness with hypotension: Yes Has patient had a PCN reaction causing severe rash involving mucus membranes or skin necrosis: Yes Has patient had a PCN reaction that required hospitalization Yes Has patient had a PCN reaction occurring within the last 10 years: Yes If all of the above answers are "NO", then may proceed with Cephalosporin use.   Shellfish-Derived Products Hives   Shrimp Extract Allergy Skin Test    Shrimp [Shellfish Allergy] Hives   Sulfa Antibiotics Rash    Review of Systems  All other systems reviewed and are negative. Except  as otherwise noted above in the HPI.   OBJECTIVE:   Blood pressure 103/80, pulse 79, temperature 98.4 F (36.9 C),  temperature source Oral, resp. rate 18, height '5\' 10"'$  (1.778 m), weight 122.8 kg, SpO2 100 %. Body mass index is 38.84 kg/m.  Physical Exam Constitutional:      General: She is not in acute distress.    Appearance: Normal appearance.  HENT:     Head: Normocephalic and atraumatic.  Eyes:     Extraocular Movements: Extraocular movements intact.     Conjunctiva/sclera: Conjunctivae normal.  Pulmonary:     Effort: Pulmonary effort is normal. No respiratory distress.  Abdominal:     General: There is no distension.     Palpations: Abdomen is soft.  Musculoskeletal:        General: Normal range of motion.     Cervical back: Normal range of motion and neck supple.  Skin:    Comments: Pictures noted in the media tab.   Neurological:     General: No focal deficit present.     Mental Status: She is alert and oriented to person, place, and time.  Psychiatric:        Mood and Affect: Mood normal.        Behavior: Behavior normal.      Lab Results: Lab Results  Component Value Date   WBC 7.5 11/03/2021   HGB 9.6 (L) 11/03/2021   HCT 31.2 (L) 11/03/2021   MCV 79.6 (L) 11/03/2021   PLT 497 (H) 11/03/2021    Lab Results  Component Value Date   NA 137 11/04/2021   K 3.7 11/04/2021   CO2 24 11/04/2021   GLUCOSE 95 11/04/2021   BUN 6 11/04/2021   CREATININE 0.59 11/04/2021   CALCIUM 8.7 (L) 11/04/2021   GFRNONAA >60 11/04/2021   GFRAA 99 12/06/2019    Lab Results  Component Value Date   ALT 8 11/02/2021   AST 10 (L) 11/02/2021   ALKPHOS 51 11/02/2021   BILITOT 0.3 11/02/2021       Component Value Date/Time   CRP 41.8 (H) 03/31/2017 1605       Component Value Date/Time   ESRSEDRATE 27 (H) 01/31/2020 1241    I have reviewed the micro and lab results in Epic.  Imaging: No results found.   Imaging independently reviewed in Epic.  Raynelle Highland for Infectious Disease [facility] [phone] pager 11/04/2021, 3:00 PM  I have  personally spent 80 minutes involved in face-to-face and non-face-to-face activities for this patient on the day of the visit. Professional time spent includes the following activities: Preparing to see the patient (review of tests), Obtaining and/or reviewing separately obtained history (admission/discharge record), Performing a medically appropriate examination and/or evaluation , Ordering medications/tests/procedures, referring and communicating with other health care professionals, Documenting clinical information in the EMR, Independently interpreting results (not separately reported), Communicating results to the patient/family/caregiver, Counseling and educating the patient/family/caregiver and Care coordination (not separately reported).

## 2021-11-04 NOTE — Progress Notes (Signed)
Patient requested to speak with CN by way of father who is a contact on chart. After speaking to father via phone regarding concerns related to admission and the previous days, patient and father states... Patient feels health care being received has not been sufficient. Expressed dietary issues, day shift  nursing staff concerns / physician concerns related to care treatment.  Made House supervisor aware of patient concerns. House sup. Came to speak to patient at bedside and gave Patient experience contact information. Will cont to monitor.

## 2021-11-05 DIAGNOSIS — L732 Hidradenitis suppurativa: Secondary | ICD-10-CM | POA: Diagnosis not present

## 2021-11-05 LAB — CBC
HCT: 34.5 % — ABNORMAL LOW (ref 36.0–46.0)
Hemoglobin: 10.8 g/dL — ABNORMAL LOW (ref 12.0–15.0)
MCH: 24.4 pg — ABNORMAL LOW (ref 26.0–34.0)
MCHC: 31.3 g/dL (ref 30.0–36.0)
MCV: 78.1 fL — ABNORMAL LOW (ref 80.0–100.0)
Platelets: 582 10*3/uL — ABNORMAL HIGH (ref 150–400)
RBC: 4.42 MIL/uL (ref 3.87–5.11)
RDW: 19.8 % — ABNORMAL HIGH (ref 11.5–15.5)
WBC: 8.7 10*3/uL (ref 4.0–10.5)
nRBC: 0 % (ref 0.0–0.2)

## 2021-11-05 LAB — BASIC METABOLIC PANEL
Anion gap: 8 (ref 5–15)
BUN: 7 mg/dL (ref 6–20)
CO2: 21 mmol/L — ABNORMAL LOW (ref 22–32)
Calcium: 9 mg/dL (ref 8.9–10.3)
Chloride: 109 mmol/L (ref 98–111)
Creatinine, Ser: 0.57 mg/dL (ref 0.44–1.00)
GFR, Estimated: >60 mL/min (ref >60)
Glucose, Bld: 96 mg/dL (ref 70–99)
Potassium: 3.6 mmol/L (ref 3.5–5.1)
Sodium: 138 mmol/L (ref 135–145)

## 2021-11-05 LAB — CULTURE, BLOOD (ROUTINE X 2)
Culture: NO GROWTH
Culture: NO GROWTH
Special Requests: ADEQUATE

## 2021-11-05 MED ORDER — CLOTRIMAZOLE 1 % VA CREA: 1.0000 | TOPICAL_CREAM | Freq: Every day | VAGINAL | 0 refills | Status: DC

## 2021-11-05 MED ORDER — CLOTRIMAZOLE 1 % VA CREA
1.0000 | TOPICAL_CREAM | Freq: Every day | VAGINAL | Status: DC
  Administered 2021-11-05: 1 via VAGINAL
  Filled 2021-11-05: qty 45

## 2021-11-05 MED ORDER — CLINDAMYCIN PHOSPHATE 1 % EX SOLN: Freq: Two times a day (BID) | CUTANEOUS | 0 refills | Status: DC

## 2021-11-05 MED ORDER — FEBUXOSTAT 40 MG PO TABS: 40.0000 mg | ORAL_TABLET | Freq: Every day | ORAL | 1 refills | Status: DC

## 2021-11-05 MED ORDER — COLCHICINE 0.6 MG PO TABS: 0.6000 mg | ORAL_TABLET | Freq: Every day | ORAL | 1 refills | Status: DC

## 2021-11-05 MED ORDER — CHLORHEXIDINE GLUCONATE 4 % EX LIQD: Freq: Every day | CUTANEOUS | 0 refills | Status: DC | PRN

## 2021-11-05 MED ORDER — ORITAVANCIN DIPHOSPHATE 400 MG IV SOLR
1200.0000 mg | Freq: Once | INTRAVENOUS | Status: AC
  Administered 2021-11-05: 1200 mg via INTRAVENOUS
  Filled 2021-11-05: qty 120

## 2021-11-05 NOTE — Progress Notes (Signed)
No change from am assessment. The patient is alert, oriented x4 and ambulatory without assistance. Discharge instructions were reviewed. Questions, concerns denied at this time. Dressing supplies provided.

## 2021-11-05 NOTE — Progress Notes (Signed)
[facility] for Infectious Disease  Date of Admission:  10/31/2021           Reason for Consult: HS Flare                                       Current antibiotics: Vancomycin and Cefepime 7/28 -- present  ASSESSMENT:    41 y.o. female admitted with:  HS flare: Extensive involvement with HS involving right axilla, left inframammary crease and perineum.  She follows with derm and rheum at [facility] as well as surgery with Dr Kae Heller and plastics.  Planning for excision of right adnexa on 12/05/21.  Her lesions have improved this admission and currently on day #6 of vancomycin/cefepime.  She was on doxycycline prior to admission.  Not sure how effective or necessary ongoing broad spectrum antibiotics are at this point vs trying to target her underlying dermatologic/rheumatologic issue.   RECOMMENDATIONS:    Discussed with patient options for continuing antibiotics at discharge to treat her HS flare.   She prefers long acting IV option so will dose oritavancin this morning and stop vancomycin and cefepime This will provide 10-14 days of therapeutic antibiotic dosing from today with broad Staph and Strep coverage which I think is the most likely organisms that need addressing.  She can then resume her doxycycline or other regimen preferred by her HS specialist Continue wound care WIll sign off, please call as needed   Principal Problem:   Hidradenitis suppurativa Active Problems:   Iron deficiency anemia   Wolff-Parkinson-White (WPW) syndrome   Hydradenitis   Abscess   Class 2 obesity   Prediabetes   Thrombocytosis    MEDICATIONS:    Scheduled Meds:  clindamycin   Topical BID   colchicine  0.6 mg Oral Daily   febuxostat  40 mg Oral Daily   metoprolol succinate  25 mg Oral Daily   pantoprazole  40 mg Oral Daily   prednisoLONE acetate  1 drop Both Eyes QID   Continuous Infusions:  ceFEPime (MAXIPIME) IV 2 g (11/04/21 2228)   vancomycin 1,250 mg (11/05/21 0603)   PRN  Meds:.acetaminophen **OR** acetaminophen, chlorhexidine, HYDROmorphone (DILAUDID) injection, ondansetron **OR** ondansetron (ZOFRAN) IV, oxyCODONE, traZODone  SUBJECTIVE:   24 hour events:  No acute events WBC normal No fever Eating breakfast Discussed antibiotic options today with her.  Review of Systems  All other systems reviewed and are negative.     OBJECTIVE:   Blood pressure 101/69, pulse 87, temperature 98.2 F (36.8 C), temperature source Oral, resp. rate 20, height '5\' 10"'$  (1.778 m), weight 122.8 kg, SpO2 98 %. Body mass index is 38.84 kg/m.  Physical Exam Constitutional:      General: She is not in acute distress.    Appearance: Normal appearance.  HENT:     Head: Normocephalic and atraumatic.  Eyes:     Extraocular Movements: Extraocular movements intact.     Conjunctiva/sclera: Conjunctivae normal.  Pulmonary:     Effort: Pulmonary effort is normal. No respiratory distress.  Musculoskeletal:     Cervical back: Normal range of motion and neck supple.  Skin:    General: Skin is warm and dry.  Neurological:     General: No focal deficit present.     Mental Status: She is alert and oriented to person, place, and time.  Psychiatric:        Mood and  Affect: Mood normal.        Behavior: Behavior normal.      Lab Results: Lab Results  Component Value Date   WBC 8.7 11/05/2021   HGB 10.8 (L) 11/05/2021   HCT 34.5 (L) 11/05/2021   MCV 78.1 (L) 11/05/2021   PLT 582 (H) 11/05/2021    Lab Results  Component Value Date   NA 138 11/05/2021   K 3.6 11/05/2021   CO2 21 (L) 11/05/2021   GLUCOSE 96 11/05/2021   BUN 7 11/05/2021   CREATININE 0.57 11/05/2021   CALCIUM 9.0 11/05/2021   GFRNONAA >60 11/05/2021   GFRAA 99 12/06/2019    Lab Results  Component Value Date   ALT 8 11/02/2021   AST 10 (L) 11/02/2021   ALKPHOS 51 11/02/2021   BILITOT 0.3 11/02/2021       Component Value Date/Time   CRP 41.8 (H) 03/31/2017 1605       Component Value  Date/Time   ESRSEDRATE 27 (H) 01/31/2020 1241     I have reviewed the micro and lab results in Epic.  Imaging: No results found.   Imaging independently reviewed in Epic.    Raynelle Highland for Infectious Disease [facility] [phone] pager 11/05/2021, 9:23 AM  I have personally spent 35 minutes involved in face-to-face and non-face-to-face activities for this patient on the day of the visit. Professional time spent includes the following activities: Preparing to see the patient (review of tests), Obtaining and/or reviewing separately obtained history (admission/discharge record), Performing a medically appropriate examination and/or evaluation , Ordering medications/tests/procedures, referring and communicating with other health care professionals, Documenting clinical information in the EMR, Independently interpreting results (not separately reported), Communicating results to the patient/family/caregiver, Counseling and educating the patient/family/caregiver and Care coordination (not separately reported).

## 2021-11-05 NOTE — Progress Notes (Signed)
Pt has communicated with RN many concerns that occurred during the day with the physician and nursing staff, and dietary. Multiple concerns have been addressed and RN made charge nurse aware of concerns whom then escalated to the  housing supervisor. The pt has received patient experience contact information and informed how to share the experiences she has had here. Overall the pt has expressed that she feels she is not getting adequate healthcare and problems are being overlooked and dismissed by the Physician.

## 2021-11-05 NOTE — Discharge Summary (Signed)
[facility]  Physician Discharge Summary   Patient ID: Amanda Davenport MRN: [id number] DOB/AGE: 41-Aug-1982 41 y.o.  Admit date: 10/31/2021 Discharge date: 11/05/2021    PCP: Cipriano Mile, NP  DISCHARGE DIAGNOSES:  Principal Problem:   Hidradenitis suppurativa Active Problems:   Iron deficiency anemia   Wolff-Parkinson-White (WPW) syndrome   Thrombocytosis   RECOMMENDATIONS FOR OUTPATIENT FOLLOW UP: Patient instructed to follow-up with general surgery for surgical management    Home Health: None Equipment/Devices: None  CODE STATUS: Full code  DISCHARGE CONDITION: fair  Diet recommendation: As before  INITIAL HISTORY: 41 year old female with PMH of prediabetes, WPW, obesity, anxiety and depression, recurrent boils, hidradenitis suppurative, s/p excision with flap closure of hidradenitis from the left axilla in 2021, right hydradenitis and sepsis in January 2023 following trial of Humira with [facility] dermatology, presented to the ED with complaints of headache, fatigue, dyspnea, nausea and diarrhea.  She was on doxycycline PTA for her hidradenitis and now presented with multiple of these draining purulent foul-smelling discharge.  Admitted for hidradenitis suppurativa flare, treating supportively with broad-spectrum IV antibiotics.  Improving.  General surgery consulted and signed off 7/31.  ID consulted 8/1.  Consultations: General surgery Infectious disease  Procedures: None   HOSPITAL COURSE:   Hidradenitis suppurativa flare: Patient has extensive involvement with hidradenitis involving the right axilla, left inframammary crease and the perineum.  Follows with Dr. Romana Juniper, CCS with plans for surgery on 12/05/2021.  Followed by her dermatologist in [place].  Admitted for multiple areas draining purulent drainage.  Treated with broad-spectrum antibiotics.   General surgery consulted, recommended wound care, no surgical intervention, outpatient  follow-up with Dr Windle Guard (CCS) and Dr. Iran Planas (plastic surgery) and signed off 7/31.   Subsequently seen by infectious disease.  Will be prescribed oritavancin today prior to discharge.  Patient is stable otherwise.  Afebrile.  WBC is normal.  She feels better.  Okay for discharge today.    Iron deficiency anemia:  Thrombocytosis: Reactive due to infectious etiology above and iron deficiency.  Improved.  WPW syndrome: Continue Toprol-XL 25 Mg daily.   Hypokalemia: Replaced.  Magnesium 1.9.   Idiopathic chronic gout of multiple sites without tophus: Follows with Dr. Luberta Robertson, rheumatology at [facility], continue outpatient follow-up.  Discharged on Uloric and colchicine.  Obesity Estimated body mass index is 38.84 kg/m as calculated from the following:   Height as of this encounter: '5\' 10"'$  (1.778 m).   Weight as of this encounter: 122.8 kg.  Patient is stable.  Okay for discharge home today.  PERTINENT LABS:  The results of significant diagnostics from this hospitalization (including imaging, microbiology, ancillary and laboratory) are listed below for reference.    Microbiology: Recent Results (from the past 240 hour(s))  Blood culture (routine x 2)     Status: None   Collection Time: 10/31/21  3:13 PM   Specimen: BLOOD  Result Value Ref Range Status   Specimen Description   Final    BLOOD LEFT HAND Performed at [facility], [address] [place], [place] [address]    Special Requests   Final    BOTTLES DRAWN AEROBIC AND ANAEROBIC Blood Culture adequate volume Performed at [facility], [address], [place], [place] [address]    Culture   Final    NO GROWTH 5 DAYS Performed at [facility], [place] [address] [place], [place] [address]    Report Status 11/05/2021 FINAL  Final  Blood culture (routine x 2)     Status: None   Collection Time: 10/31/21  4:35 PM   Specimen: BLOOD  Result  Value Ref Range Status   Specimen Description   Final    BLOOD RIGHT HAND Performed at [facility], [address], [place], [place] [address]    Special Requests   Final    BOTTLES DRAWN AEROBIC AND ANAEROBIC Blood Culture results may not be optimal due to an inadequate volume of blood received in culture bottles Performed at [facility], [address], [place], [place] [address]    Culture   Final    NO GROWTH 5 DAYS Performed at [facility], [place] [address], [place], [place] [address]    Report Status 11/05/2021 FINAL  Final     Labs:   Basic Metabolic Panel: Recent Labs  Lab 11/01/21 1207 11/02/21 0445 11/03/21 0444 11/04/21 0340 11/05/21 0416  NA 139 138 138 137 138  K 3.7 3.5 3.4* 3.7 3.6  CL 112* 109 110 108 109  CO2 19* 21* 21* 24 21*  GLUCOSE 90 91 87 95 96  BUN '6 6 6 6 7  '$ CREATININE 0.67 0.65 0.73 0.59 0.57  CALCIUM 8.9 8.5* 8.6* 8.7* 9.0  MG 2.0  --   --  1.9  --    Liver Function Tests: Recent Labs  Lab 10/31/21 1513 11/02/21 0445  AST 11* 10*  ALT 6 8  ALKPHOS 63 51  BILITOT 0.3 0.3  PROT 8.2* 6.7  ALBUMIN 3.6 2.5*   Recent Labs  Lab 10/31/21 1513  LIPASE 11   CBC: Recent Labs  Lab 10/31/21 1513 11/01/21 1207 11/02/21 0758 11/03/21 0444 11/05/21 0416  WBC 12.7* 10.2 9.2 7.5 8.7  NEUTROABS 9.1*  --  5.6  --   --   HGB 10.4* 10.0* 9.3* 9.6* 10.8*  HCT 33.7* 32.0* 30.4* 31.2* 34.5*  MCV 77.8* 77.9* 79.8* 79.6* 78.1*  PLT 589* 540* 467* 497* 582*     CBG: Recent Labs  Lab 11/01/21 1316 11/01/21 1724 11/01/21 2257  GLUCAP 91 87 97     IMAGING STUDIES No results found.  DISCHARGE EXAMINATION: Vitals:   11/04/21 1228 11/04/21 2132 11/05/21 0548 11/05/21 1033  BP: 103/80 120/78 101/69 100/67  Pulse: 79 86 87 84  Resp: '18 20 20   '$ Temp: 98.4 F (36.9 C) 98.2 F (36.8 C) 98.2 F (36.8 C)   TempSrc: Oral Oral Oral   SpO2: 100% 100% 98%   Weight:      Height:        General appearance: Awake alert.  In no distress Resp: Clear to auscultation bilaterally.  Normal effort Cardio: S1-S2 is normal regular.  No S3-S4.  No rubs murmurs or bruit GI: Abdomen is soft.  Nontender nondistended.  Bowel sounds are present normal.  No masses organomegaly   DISPOSITION: Home  Discharge Instructions     Call MD for:  difficulty breathing, headache or visual disturbances   Complete by: As directed    Call MD for:  extreme fatigue   Complete by: As directed    Call MD for:  persistant dizziness or light-headedness   Complete by: As directed    Call MD for:  persistant nausea and vomiting   Complete by: As directed    Call MD for:  redness, tenderness, or signs of infection (pain, swelling, redness, odor or green/yellow discharge around incision site)   Complete by: As directed  Call MD for:  severe uncontrolled pain   Complete by: As directed    Call MD for:  temperature >100.4   Complete by: As directed    Diet - low sodium heart healthy   Complete by: As directed    Discharge instructions   Complete by: As directed    Please take your medications as prescribed.  Please be sure to follow-up with Dr. Amado Coe as well as with your rheumatologist and dermatologist.  You were cared for by a hospitalist during your hospital stay. If you have any questions about your discharge medications or the care you received while you were in the hospital after you are discharged, you can call the unit and asked to speak with the hospitalist on call if the hospitalist that took care of you is not available. Once you are discharged, your primary care physician will handle any further medical issues. Please note that NO REFILLS for any discharge medications will be authorized once you are discharged, as it is imperative that you return to your primary care physician (or establish a relationship with a primary care physician if you do not have one) for your aftercare needs so that  they can reassess your need for medications and monitor your lab values. If you do not have a primary care physician, you can call [phone] for a physician referral.   Increase activity slowly   Complete by: As directed          Allergies as of 11/05/2021       Reactions   Other Other (See Comments)   All Antibiotics cause severe vaginal yeast infections   Penicillins Hives, Itching, Swelling, Rash   Has patient had a PCN reaction causing immediate rash, facial/tongue/throat swelling, SOB or lightheadedness with hypotension: Yes Has patient had a PCN reaction causing severe rash involving mucus membranes or skin necrosis: Yes Has patient had a PCN reaction that required hospitalization Yes Has patient had a PCN reaction occurring within the last 10 years: Yes If all of the above answers are "NO", then may proceed with Cephalosporin use. Has patient had a PCN reaction causing immediate rash, facial/tongue/throat swelling, SOB or lightheadedness with hypotension: Yes Has patient had a PCN reaction causing severe rash involving mucus membranes or skin necrosis: Yes Has patient had a PCN reaction that required hospitalization Yes Has patient had a PCN reaction occurring within the last 10 years: Yes If all of the above answers are "NO", then may proceed with Cephalosporin use. Has patient had a PCN reaction causing immediate rash, facial/tongue/throat swelling, SOB or lightheadedness with hypotension: Yes Has patient had a PCN reaction causing severe rash involving mucus membranes or skin necrosis: Yes Has patient had a PCN reaction that required hospitalization Yes Has patient had a PCN reaction occurring within the last 10 years: Yes If all of the above answers are "NO", then may proceed with Cephalosporin use.   Shellfish-derived Products Hives   Shrimp Extract Allergy Skin Test    Shrimp [shellfish Allergy] Hives   Sulfa Antibiotics Rash        Medication List     TAKE these  medications    acetaminophen-codeine 300-60 MG tablet Commonly known as: TYLENOL #4 Take 1 tablet by mouth every 4 (four) hours as needed.   B-12 1000 MCG Subl Place 2,000 mcg under the tongue daily.   chlorhexidine 4 % external liquid Commonly known as: HIBICLENS Apply topically daily as needed (wash as needed).   clindamycin 1 %  external solution Commonly known as: CLEOCIN T Apply topically 2 (two) times daily.   clotrimazole 1 % vaginal cream Commonly known as: GYNE-LOTRIMIN Place 1 Applicatorful vaginally at bedtime.   colchicine 0.6 MG tablet Take 1 tablet (0.6 mg total) by mouth daily. Start taking on: November 06, 2021   febuxostat 40 MG tablet Commonly known as: Uloric Take 1 tablet (40 mg total) by mouth daily.   HYDROcodone-acetaminophen 5-325 MG tablet Commonly known as: NORCO/VICODIN Take 1 tablet by mouth every 6 (six) hours as needed.   ibuprofen 800 MG tablet Commonly known as: ADVIL TAKE 1 TABLET BY MOUTH EVERY 8 HOURS AS NEEDED FOR MODERATE PAIN. EAT BEFORE TAKING MEDICATION   metoprolol succinate 25 MG 24 hr tablet Commonly known as: TOPROL-XL Take 25 mg by mouth once.   ondansetron 4 MG tablet Commonly known as: ZOFRAN TAKE 1 TABLET(4 MG) BY MOUTH EVERY 8 HOURS AS NEEDED FOR NAUSEA OR VOMITING   pantoprazole 40 MG tablet Commonly known as: PROTONIX Take 40 mg by mouth daily.   SSD 1 % cream Generic drug: silver sulfADIAZINE Apply 1 Application topically daily.   traZODone 50 MG tablet Commonly known as: DESYREL Take 50-100 mg by mouth at bedtime as needed.   Vitamin D (Ergocalciferol) 1.25 MG (50000 UNIT) Caps capsule Commonly known as: DRISDOL Take 50,000 Units by mouth once a week.          Follow-up Information     Clovis Riley, MD. Schedule an appointment as soon as possible for a visit in 2 week(s).   Specialty: General Surgery Why: For wound re-check Contact information: [address] [place]  [place] [address] [phone]                 TOTAL DISCHARGE TIME: 35 minutes  [facility] Pager on [email or web address]  11/05/2021, 6:43 PM

## 2021-11-05 NOTE — TOC Transition Note (Signed)
Transition of Care [facility]) - CM/SW Discharge Note   Patient Details  Name: Amanda Davenport MRN: [id number] Date of Birth: 05/17/1980  [facility]) CM/SW Contact:  [name], RN Phone Number: 11/05/2021, 11:32 AM   Clinical Narrative: see prior TOC note. No further CM needs.      Final next level of care: Home/Self Care Barriers to Discharge: No Barriers Identified   Patient Goals and CMS Choice Patient states their goals for this hospitalization and ongoing recovery are:: Home CMS Medicare.gov Compare Post Acute Care list provided to:: Patient Choice offered to / list presented to : Patient  Discharge Placement                       Discharge Plan and Services   Discharge Planning Services: CM Consult Post Acute Care Choice: NA                               Social Determinants of Health (SDOH) Interventions     Readmission Risk Interventions     No data to display

## 2021-11-21 ENCOUNTER — Encounter: Payer: Self-pay | Admitting: Hematology

## 2021-11-25 ENCOUNTER — Other Ambulatory Visit: Payer: Self-pay | Admitting: *Deleted

## 2021-11-25 DIAGNOSIS — D509 Iron deficiency anemia, unspecified: Secondary | ICD-10-CM

## 2021-11-26 ENCOUNTER — Other Ambulatory Visit: Payer: Self-pay

## 2021-11-26 ENCOUNTER — Inpatient Hospital Stay: Payer: Medicare (Managed Care) | Attending: Hematology | Admitting: Hematology

## 2021-11-26 ENCOUNTER — Inpatient Hospital Stay: Payer: Medicare (Managed Care)

## 2021-11-26 VITALS — BP 95/72 | HR 89 | Temp 97.9°F | Resp 18 | Ht 70.0 in | Wt 264.2 lb

## 2021-11-26 DIAGNOSIS — D509 Iron deficiency anemia, unspecified: Secondary | ICD-10-CM | POA: Insufficient documentation

## 2021-11-26 LAB — CMP (CANCER CENTER ONLY)
ALT: 11 U/L (ref 0–44)
AST: 14 U/L — ABNORMAL LOW (ref 15–41)
Albumin: 3.8 g/dL (ref 3.5–5.0)
Alkaline Phosphatase: 89 U/L (ref 38–126)
Anion gap: 8 (ref 5–15)
BUN: 9 mg/dL (ref 6–20)
CO2: 24 mmol/L (ref 22–32)
Calcium: 9.7 mg/dL (ref 8.9–10.3)
Chloride: 104 mmol/L (ref 98–111)
Creatinine: 0.74 mg/dL (ref 0.44–1.00)
GFR, Estimated: >60 mL/min (ref >60)
Glucose, Bld: 101 mg/dL — ABNORMAL HIGH (ref 70–99)
Potassium: 4 mmol/L (ref 3.5–5.1)
Sodium: 136 mmol/L (ref 135–145)
Total Bilirubin: 0.3 mg/dL (ref 0.3–1.2)
Total Protein: 9 g/dL — ABNORMAL HIGH (ref 6.5–8.1)

## 2021-11-26 LAB — CBC WITH DIFFERENTIAL (CANCER CENTER ONLY)
Abs Immature Granulocytes: 0.02 10*3/uL (ref 0.00–0.07)
Basophils Absolute: 0.1 10*3/uL (ref 0.0–0.1)
Basophils Relative: 1 %
Eosinophils Absolute: 0.2 10*3/uL (ref 0.0–0.5)
Eosinophils Relative: 2 %
HCT: 31.8 % — ABNORMAL LOW (ref 36.0–46.0)
Hemoglobin: 10.2 g/dL — ABNORMAL LOW (ref 12.0–15.0)
Immature Granulocytes: 0 %
Lymphocytes Relative: 23 %
Lymphs Abs: 2.4 10*3/uL (ref 0.7–4.0)
MCH: 24.8 pg — ABNORMAL LOW (ref 26.0–34.0)
MCHC: 32.1 g/dL (ref 30.0–36.0)
MCV: 77.2 fL — ABNORMAL LOW (ref 80.0–100.0)
Monocytes Absolute: 0.5 10*3/uL (ref 0.1–1.0)
Monocytes Relative: 5 %
Neutro Abs: 7 10*3/uL (ref 1.7–7.7)
Neutrophils Relative %: 69 %
Platelet Count: 541 10*3/uL — ABNORMAL HIGH (ref 150–400)
RBC: 4.12 MIL/uL (ref 3.87–5.11)
RDW: 18.3 % — ABNORMAL HIGH (ref 11.5–15.5)
WBC Count: 10.1 10*3/uL (ref 4.0–10.5)
nRBC: 0 % (ref 0.0–0.2)

## 2021-11-26 LAB — IRON AND IRON BINDING CAPACITY (CC-WL,HP ONLY)
Iron: 11 ug/dL — ABNORMAL LOW (ref 28–170)
Saturation Ratios: 4 % — ABNORMAL LOW (ref 10.4–31.8)
TIBC: 288 ug/dL (ref 250–450)
UIBC: 277 ug/dL (ref 148–442)

## 2021-11-26 LAB — FERRITIN: Ferritin: 61 ng/mL (ref 11–307)

## 2021-11-26 LAB — VITAMIN B12: Vitamin B-12: 312 pg/mL (ref 180–914)

## 2021-11-30 ENCOUNTER — Emergency Department (HOSPITAL_COMMUNITY): Payer: Medicare (Managed Care)

## 2021-11-30 ENCOUNTER — Emergency Department (HOSPITAL_COMMUNITY)
Admission: EM | Admit: 2021-11-30 | Discharge: 2021-11-30 | Disposition: A | Discharge status: Home / Self Care | Payer: Medicare (Managed Care) | Attending: Emergency Medicine | Admitting: Emergency Medicine

## 2021-11-30 ENCOUNTER — Other Ambulatory Visit: Payer: Self-pay

## 2021-11-30 ENCOUNTER — Encounter (HOSPITAL_COMMUNITY): Payer: Self-pay

## 2021-11-30 DIAGNOSIS — R5383 Other fatigue: Secondary | ICD-10-CM | POA: Insufficient documentation

## 2021-11-30 DIAGNOSIS — L732 Hidradenitis suppurativa: Secondary | ICD-10-CM | POA: Diagnosis present

## 2021-11-30 DIAGNOSIS — M25561 Pain in right knee: Secondary | ICD-10-CM | POA: Insufficient documentation

## 2021-11-30 DIAGNOSIS — R197 Diarrhea, unspecified: Secondary | ICD-10-CM | POA: Diagnosis not present

## 2021-11-30 DIAGNOSIS — R519 Headache, unspecified: Secondary | ICD-10-CM | POA: Insufficient documentation

## 2021-11-30 DIAGNOSIS — Z79899 Other long term (current) drug therapy: Secondary | ICD-10-CM | POA: Diagnosis not present

## 2021-11-30 DIAGNOSIS — Z8619 Personal history of other infectious and parasitic diseases: Secondary | ICD-10-CM | POA: Diagnosis not present

## 2021-11-30 DIAGNOSIS — R053 Chronic cough: Secondary | ICD-10-CM | POA: Diagnosis not present

## 2021-11-30 DIAGNOSIS — R110 Nausea: Secondary | ICD-10-CM | POA: Diagnosis not present

## 2021-11-30 DIAGNOSIS — D72829 Elevated white blood cell count, unspecified: Secondary | ICD-10-CM | POA: Insufficient documentation

## 2021-11-30 DIAGNOSIS — J984 Other disorders of lung: Secondary | ICD-10-CM | POA: Diagnosis not present

## 2021-11-30 LAB — PROTIME-INR
INR: 1.1 (ref 0.8–1.2)
Prothrombin Time: 13.8 seconds (ref 11.4–15.2)

## 2021-11-30 LAB — CBC WITH DIFFERENTIAL/PLATELET
Abs Immature Granulocytes: 0.04 10*3/uL (ref 0.00–0.07)
Basophils Absolute: 0.1 10*3/uL (ref 0.0–0.1)
Basophils Relative: 1 %
Eosinophils Absolute: 0.1 10*3/uL (ref 0.0–0.5)
Eosinophils Relative: 1 %
HCT: 28.4 % — ABNORMAL LOW (ref 36.0–46.0)
Hemoglobin: 9 g/dL — ABNORMAL LOW (ref 12.0–15.0)
Immature Granulocytes: 0 %
Lymphocytes Relative: 12 %
Lymphs Abs: 1.7 10*3/uL (ref 0.7–4.0)
MCH: 24.7 pg — ABNORMAL LOW (ref 26.0–34.0)
MCHC: 31.7 g/dL (ref 30.0–36.0)
MCV: 78 fL — ABNORMAL LOW (ref 80.0–100.0)
Monocytes Absolute: 0.7 10*3/uL (ref 0.1–1.0)
Monocytes Relative: 5 %
Neutro Abs: 11.5 10*3/uL — ABNORMAL HIGH (ref 1.7–7.7)
Neutrophils Relative %: 81 %
Platelets: 572 10*3/uL — ABNORMAL HIGH (ref 150–400)
RBC: 3.64 MIL/uL — ABNORMAL LOW (ref 3.87–5.11)
RDW: 17.8 % — ABNORMAL HIGH (ref 11.5–15.5)
WBC: 14.2 10*3/uL — ABNORMAL HIGH (ref 4.0–10.5)
nRBC: 0 % (ref 0.0–0.2)

## 2021-11-30 LAB — COMPREHENSIVE METABOLIC PANEL
ALT: 10 U/L (ref 0–44)
AST: 12 U/L — ABNORMAL LOW (ref 15–41)
Albumin: 3 g/dL — ABNORMAL LOW (ref 3.5–5.0)
Alkaline Phosphatase: 69 U/L (ref 38–126)
Anion gap: 9 (ref 5–15)
BUN: 10 mg/dL (ref 6–20)
CO2: 20 mmol/L — ABNORMAL LOW (ref 22–32)
Calcium: 9 mg/dL (ref 8.9–10.3)
Chloride: 112 mmol/L — ABNORMAL HIGH (ref 98–111)
Creatinine, Ser: 0.84 mg/dL (ref 0.44–1.00)
GFR, Estimated: >60 mL/min (ref >60)
Glucose, Bld: 109 mg/dL — ABNORMAL HIGH (ref 70–99)
Potassium: 3.4 mmol/L — ABNORMAL LOW (ref 3.5–5.1)
Sodium: 141 mmol/L (ref 135–145)
Total Bilirubin: 0.4 mg/dL (ref 0.3–1.2)
Total Protein: 8.5 g/dL — ABNORMAL HIGH (ref 6.5–8.1)

## 2021-11-30 LAB — I-STAT BETA HCG BLOOD, ED (MC, WL, AP ONLY): I-stat hCG, quantitative: <5 m[IU]/mL (ref <5)

## 2021-11-30 LAB — LACTIC ACID, PLASMA: Lactic Acid, Venous: 0.9 mmol/L (ref 0.5–1.9)

## 2021-11-30 MED ORDER — ONDANSETRON HCL 4 MG/2ML IJ SOLN
4.0000 mg | Freq: Once | INTRAMUSCULAR | Status: AC
  Administered 2021-11-30: 4 mg via INTRAVENOUS
  Filled 2021-11-30: qty 2

## 2021-11-30 MED ORDER — LACTATED RINGERS IV BOLUS
1000.0000 mL | Freq: Once | INTRAVENOUS | Status: AC
  Administered 2021-11-30: 1000 mL via INTRAVENOUS

## 2021-11-30 MED ORDER — KETOROLAC TROMETHAMINE 30 MG/ML IJ SOLN
30.0000 mg | Freq: Once | INTRAMUSCULAR | Status: AC
  Administered 2021-11-30: 30 mg via INTRAVENOUS
  Filled 2021-11-30: qty 1

## 2021-11-30 MED ORDER — HYDROMORPHONE HCL 2 MG/ML IJ SOLN
0.5000 mg | Freq: Once | INTRAMUSCULAR | Status: AC
  Administered 2021-11-30: 0.5 mg via INTRAVENOUS
  Filled 2021-11-30: qty 1

## 2021-11-30 MED ORDER — DEXTROSE 5 % IV SOLN
1500.0000 mg | Freq: Once | INTRAVENOUS | Status: AC
  Administered 2021-11-30: 1500 mg via INTRAVENOUS
  Filled 2021-11-30: qty 75

## 2021-11-30 MED ORDER — HYDROMORPHONE HCL 2 MG/ML IJ SOLN
1.0000 mg | Freq: Once | INTRAMUSCULAR | Status: AC
  Administered 2021-11-30: 1 mg via INTRAVENOUS
  Filled 2021-11-30: qty 1

## 2021-11-30 NOTE — ED Provider Triage Note (Signed)
Emergency Medicine Provider Triage Evaluation Note  Amanda Davenport , a 41 y.o. female  was evaluated in triage.  Pt complains of continued infected wounds.  Recently discharged from ED due to sepsis on 11/05/21, however states she feels the same without improvement.  Still with headaches, nausea, diarrhea, and fatigue.  Denies fever or chills.  Hx of gout and hidradenitis suppurativa.  Multiple areas of skin breakdown / infection / abscesses.  Denies vomiting, neck stiffness, vision changes.  Was discharged on antibiotics and finished them.  Most recent antibiotic use 3-4 days ago.  Review of Systems  Positive:  Negative: See above  Physical Exam  BP 126/66   Pulse 98   Temp 98.2 F (36.8 C) (Oral)   Resp (!) 34   Ht '5\' 10"'$  (1.778 m)   Wt 117.9 kg   LMP 10/22/2021   SpO2 100%   BMI 37.31 kg/m  Gen:   Awake, no distress   Resp:  Normal effort  MSK:   Moves extremities without difficulty  Other:  Multiple areas of skin breakdown with evidence of surrounding infection including right axilla, gluteal cleft, and skin folds under breasts.  Medical Decision Making  Medically screening exam initiated at 3:14 PM.  Appropriate orders placed.  Amanda Davenport was informed that the remainder of the evaluation will be completed by another provider, this initial triage assessment does not replace that evaluation, and the importance of remaining in the ED until their evaluation is complete.  Pt meets sepsis criteria.  Code sepsis initiated.   Amanda [place], PA-C 27/06/23 1520

## 2021-11-30 NOTE — ED Triage Notes (Signed)
Pt BIB [facility] from pt's home. Pt presents with R knee pain that started after recent hospitalization for sepsis. Pt with hx of septic wounds from hydradenitis. EMS found multiple wounds under her breast that appeared infected. Pt also reports wounds to axilla and buttocks.   EMS Vitals  122/70 HR 106 99% on R/A R 34 EtCO2 22

## 2021-11-30 NOTE — ED Notes (Signed)
I provided reinforced discharge education based off of after visit summary/care provided. Pt acknowledged and understood my education. Pt had no further questions/concerns for provider/myself. After visit summary provided to pt. 

## 2021-11-30 NOTE — Progress Notes (Signed)
Pharmacy Note:  Dalbavancin for Acute Bacterial Skin and Skin Structure Infection (ABSSSI) Patients to [facility] Discharge Amanda Davenport is an 41 y.o. female who presented to [facility] on 11/30/2021 with an Acute Bacterial Skin and Skin Structure Infection  Inclusion criteria - Indication '[]'$  Moderately large skin lesion (>=75 cm2 or larger - about the size of a baseball) '[]'$  Cellulitis X recurrent boils/hidradenitis suppurativa  Inclusion Criteria - at least one SIRS criteria present '[x]'$  WBC > 12,000 or < 4000 '[]'$  temp >100.9 or < 96.8 '[]'$  heart rate >90'[x]'$  respiratory rate >20  Patient was evaluated for the following exclusion criteria and no exclusions were found  Hardware involvement, Hypotension / shock, Elevated lactate (>2) without other explanation, ram-negative infection risk factors (bites, water exposure, infection after trauma, infection after skin graft, neutropenia, burns, severe immunocompromise), necrotizing fasciitis possible or confirmed, Known or suspected osteomyelitis or septic arthritis, endocarditis, diabetic foot infection, ischemic ulcers, post-operative wound infection, perirectal infections, need for drainage in the operating room, hand or facial infections, injection drug users with a fever, bacteremia, pregnancy or breastfeeding, allergy to related antibiotics like vancomycin, known liver disease (t.bili >2x ULN or AST/ALT 3x ULN)  Eudelia Bunch, Pharm.D 11/30/2021 6:49 PM Clinical Pharmacist

## 2021-11-30 NOTE — ED Provider Notes (Signed)
[facility] DEPT Provider Note   CSN: [id number] Arrival date & time: 11/30/21  1432     History {Add pertinent medical, surgical, social history, OB history to HPI:1} Chief Complaint  Patient presents with   Code Sepsis    Amanda Davenport is a 41 y.o. female.  HPI      41 year old female with history admission to the hospital July 28 to August 2 for recurrent boils/hidradenitis suppurativa, history of prediabetes, prior hidradenitis with sepsis following trial of Humira   Is followed by dermatology in [place].  Home Medications Prior to Admission medications   Medication Sig Start Date End Date Taking? Authorizing Provider  acetaminophen-codeine (TYLENOL #4) 300-60 MG tablet Take 1 tablet by mouth every 4 (four) hours as needed. 05/14/21   Shawna Clamp, MD  chlorhexidine (HIBICLENS) 4 % external liquid Apply topically daily as needed (wash as needed). 11/05/21   Bonnielee Haff, MD  clindamycin (CLEOCIN T) 1 % external solution Apply topically 2 (two) times daily. 11/05/21   Bonnielee Haff, MD  clotrimazole (GYNE-LOTRIMIN) 1 % vaginal cream Place 1 Applicatorful vaginally at bedtime. Patient not taking: Reported on 11/26/2021 11/05/21   Bonnielee Haff, MD  colchicine 0.6 MG tablet Take 1 tablet (0.6 mg total) by mouth daily. 11/06/21   Bonnielee Haff, MD  Cyanocobalamin (B-12) 1000 MCG SUBL Place 2,000 mcg under the tongue daily. 07/31/21   Brunetta Genera, MD  febuxostat (ULORIC) 40 MG tablet Take 1 tablet (40 mg total) by mouth daily. 11/05/21   Bonnielee Haff, MD  HYDROcodone-acetaminophen (NORCO/VICODIN) 5-325 MG tablet Take 1 tablet by mouth every 6 (six) hours as needed. Patient not taking: Reported on 11/26/2021 10/24/21   [provider]  ibuprofen (ADVIL) 800 MG tablet TAKE 1 TABLET BY MOUTH EVERY 8 HOURS AS NEEDED FOR MODERATE PAIN. EAT BEFORE TAKING MEDICATION 03/14/20   Charlott Rakes, MD  metoprolol succinate (TOPROL-XL)  25 MG 24 hr tablet Take 25 mg by mouth once.    [provider]  ondansetron (ZOFRAN) 4 MG tablet TAKE 1 TABLET(4 MG) BY MOUTH EVERY 8 HOURS AS NEEDED FOR NAUSEA OR VOMITING 04/02/20   Azzie Glatter, FNP  pantoprazole (PROTONIX) 40 MG tablet Take 40 mg by mouth daily. 10/24/21   [provider]  SSD 1 % cream Apply 1 Application topically daily. 08/08/21   [provider]  traZODone (DESYREL) 50 MG tablet Take 50-100 mg by mouth at bedtime as needed. 10/18/21   [provider]  Vitamin D, Ergocalciferol, (DRISDOL) 1.25 MG (50000 UNIT) CAPS capsule Take 50,000 Units by mouth once a week. 03/16/21   [provider]      Allergies    Other, Penicillins, Shellfish-derived products, Shrimp extract allergy skin test, Shrimp [shellfish allergy], and Sulfa antibiotics    Review of Systems   Review of Systems  Physical Exam Updated Vital Signs BP 100/67   Pulse 94   Temp 98.2 F (36.8 C) (Oral)   Resp 15   Ht '5\' 10"'$  (1.778 m)   Wt 117.9 kg   LMP 10/22/2021   SpO2 97%   BMI 37.31 kg/m  Physical Exam  ED Results / Procedures / Treatments   Labs (all labs ordered are listed, but only abnormal results are displayed) Labs Reviewed  COMPREHENSIVE METABOLIC PANEL - Abnormal; Notable for the following components:      Result Value   Potassium 3.4 (*)    Chloride 112 (*)    CO2 20 (*)  Glucose, Bld 109 (*)    Total Protein 8.5 (*)    Albumin 3.0 (*)    AST 12 (*)    All other components within normal limits  CBC WITH DIFFERENTIAL/PLATELET - Abnormal; Notable for the following components:   WBC 14.2 (*)    RBC 3.64 (*)    Hemoglobin 9.0 (*)    HCT 28.4 (*)    MCV 78.0 (*)    MCH 24.7 (*)    RDW 17.8 (*)    Platelets 572 (*)    Neutro Abs 11.5 (*)    All other components within normal limits  CULTURE, BLOOD (ROUTINE X 2)  CULTURE, BLOOD (ROUTINE X 2)  LACTIC ACID, PLASMA  PROTIME-INR  LACTIC ACID, PLASMA  URINALYSIS, ROUTINE W  REFLEX MICROSCOPIC  I-STAT BETA HCG BLOOD, ED ([facility], [facility], [facility] ONLY)    EKG None  Radiology DG Chest 2 View  Result Date: 11/30/2021 CLINICAL DATA:  Shortness of breath and chest pain. EXAM: CHEST - 2 VIEW COMPARISON:  06/01/2019 FINDINGS: The cardiomediastinal silhouette is unremarkable. Mild opacity in the MEDIAL RIGHT lung base noted, favor atelectasis over airspace disease. There is no evidence of pulmonary edema, suspicious pulmonary nodule/mass, pleural effusion, or pneumothorax. No acute bony abnormalities are identified. IMPRESSION: Mild opacity in the MEDIAL RIGHT lung base, favor atelectasis over airspace disease/pneumonia. Electronically Signed   By: Margarette Canada M.D.   On: 11/30/2021 16:17    Procedures Procedures  {Document cardiac monitor, telemetry assessment procedure when appropriate:1}  Medications Ordered in ED Medications  lactated ringers bolus 1,000 mL (1,000 mLs Intravenous New Bag/Given 11/30/21 1628)    ED Course/ Medical Decision Making/ A&P                           Medical Decision Making Amount and/or Complexity of Data Reviewed Labs: ordered. Radiology: ordered.   ***  {Document critical care time when appropriate:1} {Document review of labs and clinical decision tools ie heart score, Chads2Vasc2 etc:1}  {Document your independent review of radiology images, and any outside records:1} {Document your discussion with family members, caretakers, and with consultants:1} {Document social determinants of health affecting pt's care:1} {Document your decision making why or why not admission, treatments were needed:1} Final Clinical Impression(s) / ED Diagnoses Final diagnoses:  None    Rx / DC Orders ED Discharge Orders     None

## 2021-12-02 NOTE — Progress Notes (Incomplete)
HEMATOLOGY/ONCOLOGY CLINIC NOTE  Date of Service: 11/26/2021   PCP [facility] (Gout management) Gennette Pac MD Cardiology - [facility] [place]  CHIEF COMPLAINTS/PURPOSE OF CONSULTATION:  For continued management of iron deficiency anemia with reactive thrombocytosis  HISTORY OF PRESENTING ILLNESS:  Amanda Davenport is a wonderful 41 y.o. female who has been referred to [place] for evaluation and management of thrombocytosis.  She was last seen by [place] in 2016 and was lost to follow-up due to issues with her medical insurance.  Patient has been referred back to [place] for evaluation of her intermittent thrombocytosis and severe iron deficiency anemia. The pt reports her hidradenitis suppurativa has been quite bothersome and she had surgery on 4/23-- left HS excision--still has an open surgical wound with some oozing .  Had blood loss during surgery as well.  Patient notes she has had significant issues with severe gout attacks- prednisone, allopurinol/colchicine/febostat.  She is following with Dr.Khiem Gardiner Sleeper MD for her rheumatology cares.  She continues to have heavy menstrual periods lasting 5 days of which 4 are very heavy. Has been taking Geritol  1 month and also has continued taking prenatal vitamins No personal history of VTE No FHx of VTE, bleeding or clotting disorders but mother had PE in the setting of surgery and active breast cancer. Quit smoking tobacco- 07/2019. 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  On review of systems, pt reports severe fatigue and some ice cravings.  No fevers chills night sweats or new bone pains.  INTERVAL HISTORY:  Amanda Davenport is here for continued  evaluation and management of her iron deficiency anemia.  She continues to have heavy periods.  Was admitted recently to the hospital with axillary abscess related to her hidradenitis suppurativa. No other overt source of bleeding noted at this time. No other acute new focal symptoms. Labs done today were reviewed in detail with the patient.   MEDICAL HISTORY:  Past Medical History:  Diagnosis Date  . Allergy   . Anemia    receives transfusions periodically  . Anxiety   . Arrhythmia   . Chronic headache   . Depression   . Dysrhythmia   . Gout 12/2018  . Knee pain   . Nearsightedness    wears glasses  . Neuropathy   . Obesity   . Pneumonia   . Pre-diabetes   . Recurrent boils   . WPW (Wolff-Parkinson-White syndrome)     SURGICAL HISTORY: Past Surgical History:  Procedure Laterality Date  . ADJACENT TISSUE TRANSFER/TISSUE REARRANGEMENT Left 07/28/2019   Procedure: ADJACENT TISSUE TRANSFER TO LEFT AXILLA GREATER THAN 100 CM SQUARED;  Surgeon: Irene Limbo, MD;  Location: [facility];  Service: Plastics;  Laterality: Left;  . HYDRADENITIS EXCISION Left 07/28/2019   Procedure: EXCISION LEFT HIDRADENITIS AXILLA;  Surgeon: Clovis Riley, MD;  Location: [facility];  Service: General;  Laterality: Left;  . TONSILLECTOMY      SOCIAL HISTORY: Social History   Socioeconomic History  . Marital status: Single    Spouse name: Not on file  . Number of children: 0  . Years of education: Not on file  .  Highest education level: Not on file  Occupational History  . Occupation: disabled  Tobacco Use  . Smoking status: Former    Years: 0.50    Types: Cigarettes, Cigars    Quit date: 04/30/2019    Years since quitting: 2.5  . Smokeless tobacco: Never  . Tobacco comments:    smokes black and milds - last use early-mid August  Vaping Use  . Vaping Use: Never used  Substance and Sexual Activity  . Alcohol use: No  . Drug use: Yes    Frequency: 7.0 times per week    Types:  Marijuana  . Sexual activity: Yes    Partners: Male    Birth control/protection: Condom  Other Topics Concern  . Not on file  Social History Narrative  . Not on file   Social Determinants of Health   Financial Resource Strain: Low Risk  (12/28/2020)   Overall Financial Resource Strain (CARDIA)   . Difficulty of Paying Living Expenses: Not hard at all  Food Insecurity: No Food Insecurity (12/28/2020)   Hunger Vital Sign   . Worried About Charity fundraiser in the Last Year: Never true   . Ran Out of Food in the Last Year: Never true  Transportation Needs: No Transportation Needs (12/28/2020)   PRAPARE - Transportation   . Lack of Transportation (Medical): No   . Lack of Transportation (Non-Medical): No  Physical Activity: Inactive (12/28/2020)   Exercise Vital Sign   . Days of Exercise per Week: 0 days   . Minutes of Exercise per Session: 0 min  Stress: Stress Concern Present (12/28/2020)   [place]   . Feeling of Stress : To some extent  Social Connections: Socially Isolated (12/28/2020)   Social Connection and Isolation Panel [NHANES]   . Frequency of Communication with Friends and Family: More than three times a week   . Frequency of Social Gatherings with Friends and Family: Never   . Attends Religious Services: Never   . Active Member of Clubs or Organizations: No   . Attends Archivist Meetings: Never   . Marital Status: Never married  Intimate Partner Violence: Not At Risk (12/28/2020)   Humiliation, Afraid, Rape, and Kick questionnaire   . Fear of Current or Ex-Partner: No   . Emotionally Abused: No   . Physically Abused: No   . Sexually Abused: No    FAMILY HISTORY: Family History  Problem Relation Age of Onset  . Breast cancer Mother   . Pulmonary embolism Mother        died of PE  . Colon cancer Mother   . Irritable bowel syndrome Mother   . Cancer Mother   . Hypertension Father    . Diabetes Paternal Grandmother   . Heart disease Neg Hx   . Stroke Neg Hx     ALLERGIES:  is allergic to other, penicillins, shellfish-derived products, shrimp extract allergy skin test, shrimp [shellfish allergy], and sulfa antibiotics.  MEDICATIONS:  Current Outpatient Medications  Medication Sig Dispense Refill  . acetaminophen-codeine (TYLENOL #4) 300-60 MG tablet Take 1 tablet by mouth every 4 (four) hours as needed. 15 tablet 0  . chlorhexidine (HIBICLENS) 4 % external liquid Apply topically daily as needed (wash as needed). 120 mL 0  . clindamycin (CLEOCIN T) 1 % external solution Apply topically 2 (two) times daily. 30 mL 0  . colchicine 0.6 MG tablet Take 1 tablet (0.6 mg total) by mouth daily.  30 tablet 1  . febuxostat (ULORIC) 40 MG tablet Take 1 tablet (40 mg total) by mouth daily. 30 tablet 1  . ibuprofen (ADVIL) 800 MG tablet TAKE 1 TABLET BY MOUTH EVERY 8 HOURS AS NEEDED FOR MODERATE PAIN. EAT BEFORE TAKING MEDICATION 60 tablet 0  . metoprolol succinate (TOPROL-XL) 25 MG 24 hr tablet Take 25 mg by mouth once.    . ondansetron (ZOFRAN) 4 MG tablet TAKE 1 TABLET(4 MG) BY MOUTH EVERY 8 HOURS AS NEEDED FOR NAUSEA OR VOMITING 20 tablet 0  . pantoprazole (PROTONIX) 40 MG tablet Take 40 mg by mouth daily.    Marland Kitchen SSD 1 % cream Apply 1 Application topically daily.    . traZODone (DESYREL) 50 MG tablet Take 50-100 mg by mouth at bedtime as needed.    . Vitamin D, Ergocalciferol, (DRISDOL) 1.25 MG (50000 UNIT) CAPS capsule Take 50,000 Units by mouth once a week.    . clindamycin (CLEOCIN) 150 MG capsule Take by mouth.    . clotrimazole (GYNE-LOTRIMIN) 1 % vaginal cream Place 1 Applicatorful vaginally at bedtime. (Patient not taking: Reported on 11/26/2021) 45 g 0  . Cyanocobalamin (B-12) 1000 MCG SUBL Place 2,000 mcg under the tongue daily. 30 tablet 11  . doxycycline (MONODOX) 100 MG capsule Take 100 mg by mouth every 12 (twelve) hours.    . fluconazole (DIFLUCAN) 150 MG tablet Take by  mouth.    Marland Kitchen HYDROcodone-acetaminophen (NORCO/VICODIN) 5-325 MG tablet Take 1 tablet by mouth every 6 (six) hours as needed. (Patient not taking: Reported on 11/26/2021)    . ondansetron (ZOFRAN-ODT) 4 MG disintegrating tablet Take 4 mg by mouth every 8 (eight) hours as needed.     No current facility-administered medications for this visit.    Labs   .    Latest Ref Rng & Units 11/30/2021    3:33 PM 11/26/2021   12:41 PM 11/05/2021    4:16 AM  CBC  WBC 4.0 - 10.5 K/uL 14.2  10.1  8.7   Hemoglobin 12.0 - 15.0 g/dL 9.0  10.2  10.8   Hematocrit 36.0 - 46.0 % 28.4  31.8  34.5   Platelets 150 - 400 K/uL 572  541  582        Latest Ref Rng & Units 11/30/2021    3:33 PM 11/26/2021   12:41 PM 11/05/2021    4:16 AM  CMP  Glucose 70 - 99 mg/dL 109  101  96   BUN 6 - 20 mg/dL '10  9  7   '$ Creatinine 0.44 - 1.00 mg/dL 0.84  0.74  0.57   Sodium 135 - 145 mmol/L 141  136  138   Potassium 3.5 - 5.1 mmol/L 3.4  4.0  3.6   Chloride 98 - 111 mmol/L 112  104  109   CO2 22 - 32 mmol/L '20  24  21   '$ Calcium 8.9 - 10.3 mg/dL 9.0  9.7  9.0   Total Protein 6.5 - 8.1 g/dL 8.5  9.0    Total Bilirubin 0.3 - 1.2 mg/dL 0.4  0.3    Alkaline Phos 38 - 126 U/L 69  89    AST 15 - 41 U/L 12  14    ALT 0 - 44 U/L 10  11    .No results found for: "LDH"   PHYSICAL EXAMINATION: ECOG PERFORMANCE STATUS: 2 - Symptomatic, <50% confined to bed  . Vitals:   11/26/21 1304  BP: 95/72  Pulse: 89  Resp:  18  Temp: 97.9 F (36.6 C)  SpO2: 98%   Filed Weights   11/26/21 1304  Weight: 264 lb 3.2 oz (119.8 kg)   .Body mass index is 37.91 kg/m.  NAD GENERAL:alert, in no acute distress and comfortable EYES: conjunctiva are pink and non-injected, sclera anicteric OROPHARYNX: MMM, no exudates, no oropharyngeal erythema or ulceration NECK: supple, no JVD LYMPH:  no palpable lymphadenopathy in the cervical, axillary or inguinal regions LUNGS: clear to auscultation b/l with normal respiratory effort HEART: regular  rate & rhythm ABDOMEN:  normoactive bowel sounds , non tender, not distended. Extremity: no pedal edema PSYCH: alert & oriented x 3 with fluent speech NEURO: no focal motor/sensory deficits  LABORATORY DATA:  I have reviewed the data as listed  .    Latest Ref Rng & Units 11/30/2021    3:33 PM 11/26/2021   12:41 PM 11/05/2021    4:16 AM  CBC  WBC 4.0 - 10.5 K/uL 14.2  10.1  8.7   Hemoglobin 12.0 - 15.0 g/dL 9.0  10.2  10.8   Hematocrit 36.0 - 46.0 % 28.4  31.8  34.5   Platelets 150 - 400 K/uL 572  541  582    . CBC    Component Value Date/Time   WBC 14.2 (H) 11/30/2021 1533   RBC 3.64 (L) 11/30/2021 1533   HGB 9.0 (L) 11/30/2021 1533   HGB 10.2 (L) 11/26/2021 1241   HGB 8.7 (L) 01/11/2020 1015   HGB 9.5 (L) 12/18/2014 1518   HCT 28.4 (L) 11/30/2021 1533   HCT 31.9 (L) 01/11/2020 1015   HCT 31.4 (L) 12/18/2014 1518   PLT 572 (H) 11/30/2021 1533   PLT 541 (H) 11/26/2021 1241   PLT 639 (H) 01/11/2020 1015   MCV 78.0 (L) 11/30/2021 1533   MCV 71 (L) 01/11/2020 1015   MCV 67.0 (L) 12/18/2014 1518   MCH 24.7 (L) 11/30/2021 1533   MCHC 31.7 11/30/2021 1533   RDW 17.8 (H) 11/30/2021 1533   RDW 18.6 (H) 01/11/2020 1015   RDW 26.0 (H) 12/18/2014 1518   LYMPHSABS 1.7 11/30/2021 1533   LYMPHSABS 2.0 01/11/2020 1015   LYMPHSABS 1.6 12/18/2014 1518   MONOABS 0.7 11/30/2021 1533   MONOABS 0.3 12/18/2014 1518   EOSABS 0.1 11/30/2021 1533   EOSABS 0.2 01/11/2020 1015   BASOSABS 0.1 11/30/2021 1533   BASOSABS 0.1 01/11/2020 1015   BASOSABS 0.0 12/18/2014 1518    .    Latest Ref Rng & Units 11/30/2021    3:33 PM 11/26/2021   12:41 PM 11/05/2021    4:16 AM  CMP  Glucose 70 - 99 mg/dL 109  101  96   BUN 6 - 20 mg/dL '10  9  7   '$ Creatinine 0.44 - 1.00 mg/dL 0.84  0.74  0.57   Sodium 135 - 145 mmol/L 141  136  138   Potassium 3.5 - 5.1 mmol/L 3.4  4.0  3.6   Chloride 98 - 111 mmol/L 112  104  109   CO2 22 - 32 mmol/L '20  24  21   '$ Calcium 8.9 - 10.3 mg/dL 9.0  9.7  9.0   Total  Protein 6.5 - 8.1 g/dL 8.5  9.0    Total Bilirubin 0.3 - 1.2 mg/dL 0.4  0.3    Alkaline Phos 38 - 126 U/L 69  89    AST 15 - 41 U/L 12  14    ALT 0 - 44 U/L 10  11     .  Lab Results  Component Value Date   IRON 11 (L) 11/26/2021   TIBC 288 11/26/2021   IRONPCTSAT 4 (L) 11/26/2021   (Iron and TIBC)  Lab Results  Component Value Date   FERRITIN 61 11/26/2021   B12 ---225    RADIOGRAPHIC STUDIES: I have personally reviewed the radiological images as listed and agreed with the findings in the report. DG Knee Complete 4 Views Right  Result Date: 11/30/2021 CLINICAL DATA:  Right knee pain EXAM: RIGHT KNEE - COMPLETE 4+ VIEW COMPARISON:  None Available. FINDINGS: No fracture or dislocation is seen. The joint spaces are preserved. Visualized soft tissues are within normal limits. No suprapatellar knee joint effusion. IMPRESSION: Negative. Electronically Signed   By: Julian Hy M.D.   On: 11/30/2021 19:42   DG Chest 2 View  Result Date: 11/30/2021 CLINICAL DATA:  Shortness of breath and chest pain. EXAM: CHEST - 2 VIEW COMPARISON:  06/01/2019 FINDINGS: The cardiomediastinal silhouette is unremarkable. Mild opacity in the MEDIAL RIGHT lung base noted, favor atelectasis over airspace disease. There is no evidence of pulmonary edema, suspicious pulmonary nodule/mass, pleural effusion, or pneumothorax. No acute bony abnormalities are identified. IMPRESSION: Mild opacity in the MEDIAL RIGHT lung base, favor atelectasis over airspace disease/pneumonia. Electronically Signed   By: Margarette Canada M.D.   On: 11/30/2021 16:17    ASSESSMENT & PLAN:   41 year old African American female with   #1 Microcytic anemia - likely due to severe iron deficiency.  Unclear etiology possibly chronic hemorrhoidal bleeding versus heavy periods versus other GI losses (?NSAIDS associated ulceration) versus blood loss from ulcerations related to her hidradenitis suppurativa. Additional Elements causing anemia  include chronic inflammatory state due to hydradenitis suppurativa and severe gout. Given the relatively high RBC numbers for the given hemoglobin cannot rule out underlying thalassemia/hemoglobinopathy.  #2 Thrombocytosis This is likely reactive from severe iron deficiency anemia, surgery, previous smoking, inflammation from her hidradenitis suppurativa and gout. Given her age unlikely to be a myeloproliferative neoplasm like essential thrombocytosis.  #3 B12 deficiency likely due to poor absorption #4 severe iron deficiency PLAN: -Labs from today reviewed the patient in detail. She does have severe iron deficiency with microcytic anemia -Discussed with patient educated informed consent to proceed with additional IV iron replacement IV Venofer 300 mg weekly x3 doses -B12 levels improved but still in the lower part of the range.  Recommended increasing B12 to 2000 mcg sublingually daily. -Continue follow-up with dermatology and rheumatology for management of her severe hidradenitis suppurativa.  FOLLOW UP: Venofer '300mg'$  weekly x 3 doses RTC with Dr Irene Limbo with labsin 4 months  The total time spent in the appointment was 21 minutes*.  All of the patient's questions were answered with apparent satisfaction. The patient knows to call the clinic with any problems, questions or concerns.   Sullivan Lone MD MS AAHIVMS [facility] Hematology/Oncology Physician [facility]  .*Total Encounter Time as defined by the Centers for Medicare and Medicaid Services includes, in addition to the face-to-face time of a patient visit (documented in the note above) non-face-to-face time: obtaining and reviewing outside history, ordering and reviewing medications, tests or procedures, care coordination (communications with other health care professionals or caregivers) and documentation in the medical record.

## 2021-12-02 NOTE — Progress Notes (Signed)
HEMATOLOGY/ONCOLOGY CLINIC NOTE  Date of Service: 11/26/2021   PCP [facility] (Gout management) Gennette Pac MD Cardiology - [facility] [place]  CHIEF COMPLAINTS/PURPOSE OF CONSULTATION:  For continued management of iron deficiency anemia with reactive thrombocytosis  HISTORY OF PRESENTING ILLNESS:  Amanda Davenport is a wonderful 41 y.o. female who has been referred to [place] for evaluation and management of thrombocytosis.  She was last seen by [place] in 2016 and was lost to follow-up due to issues with her medical insurance.  Patient has been referred back to [place] for evaluation of her intermittent thrombocytosis and severe iron deficiency anemia. The pt reports her hidradenitis suppurativa has been quite bothersome and she had surgery on 4/23-- left HS excision--still has an open surgical wound with some oozing .  Had blood loss during surgery as well.  Patient notes she has had significant issues with severe gout attacks- prednisone, allopurinol/colchicine/febostat.  She is following with Dr.Khiem Gardiner Sleeper MD for her rheumatology cares.  She continues to have heavy menstrual periods lasting 5 days of which 4 are very heavy. Has been taking Geritol  1 month and also has continued taking prenatal vitamins No personal history of VTE No FHx of VTE, bleeding or clotting disorders but mother had PE in the setting of surgery and active breast cancer. Quit smoking tobacco- 07/2019. 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  On review of systems, pt reports severe fatigue and some ice cravings.  No fevers chills night sweats or new bone pains.  INTERVAL HISTORY:  Amanda Davenport is here for continued  evaluation and management of her iron deficiency anemia.  She continues to have heavy periods.  Was admitted recently to the hospital with axillary abscess related to her hidradenitis suppurativa. No other overt source of bleeding noted at this time. No other acute new focal symptoms. Labs done today were reviewed in detail with the patient.   MEDICAL HISTORY:  Past Medical History:  Diagnosis Date   Allergy    Anemia    receives transfusions periodically   Anxiety    Arrhythmia    Chronic headache    Depression    Dysrhythmia    Gout 12/2018   Knee pain    Nearsightedness    wears glasses   Neuropathy    Obesity    Pneumonia    Pre-diabetes    Recurrent boils    WPW (Wolff-Parkinson-White syndrome)     SURGICAL HISTORY: Past Surgical History:  Procedure Laterality Date   ADJACENT TISSUE TRANSFER/TISSUE REARRANGEMENT Left 07/28/2019   Procedure: ADJACENT TISSUE TRANSFER TO LEFT AXILLA GREATER THAN 100 CM SQUARED;  Surgeon: Irene Limbo, MD;  Location: [facility];  Service: Plastics;  Laterality: Left;   HYDRADENITIS EXCISION Left 07/28/2019   Procedure: EXCISION LEFT HIDRADENITIS AXILLA;  Surgeon: Clovis Riley, MD;  Location: [facility];  Service: General;  Laterality: Left;   TONSILLECTOMY      SOCIAL HISTORY: Social History   Socioeconomic History   Marital status: Single    Spouse name: Not on file   Number of children: 0   Years of education: Not on file  Highest education level: Not on file  Occupational History   Occupation: disabled  Tobacco Use   Smoking status: Former    Years: 0.50    Types: Cigarettes, Cigars    Quit date: 04/30/2019    Years since quitting: 2.5   Smokeless tobacco: Never   Tobacco comments:    smokes black and milds - last use early-mid August  Vaping Use   Vaping Use: Never used  Substance and Sexual Activity   Alcohol use: No   Drug use: Yes    Frequency: 7.0 times per week    Types: Marijuana   Sexual activity: Yes     Partners: Male    Birth control/protection: Condom  Other Topics Concern   Not on file  Social History Narrative   Not on file   Social Determinants of Health   Financial Resource Strain: Low Risk  (12/28/2020)   Overall Financial Resource Strain (CARDIA)    Difficulty of Paying Living Expenses: Not hard at all  Food Insecurity: No Food Insecurity (12/28/2020)   Hunger Vital Sign    Worried About Running Out of Food in the Last Year: Never true    [place] in the Last Year: Never true  Transportation Needs: No Transportation Needs (12/28/2020)   PRAPARE - Hydrologist (Medical): No    Lack of Transportation (Non-Medical): No  Physical Activity: Inactive (12/28/2020)   Exercise Vital Sign    Days of Exercise per Week: 0 days    Minutes of Exercise per Session: 0 min  Stress: Stress Concern Present (12/28/2020)   [place]    Feeling of Stress : To some extent  Social Connections: Socially Isolated (12/28/2020)   Social Connection and Isolation Panel [NHANES]    Frequency of Communication with Friends and Family: More than three times a week    Frequency of Social Gatherings with Friends and Family: Never    Attends Religious Services: Never    Marine scientist or Organizations: No    Attends Archivist Meetings: Never    Marital Status: Never married  Intimate Partner Violence: Not At Risk (12/28/2020)   Humiliation, Afraid, Rape, and Kick questionnaire    Fear of Current or Ex-Partner: No    Emotionally Abused: No    Physically Abused: No    Sexually Abused: No    FAMILY HISTORY: Family History  Problem Relation Age of Onset   Breast cancer Mother    Pulmonary embolism Mother        died of PE   Colon cancer Mother    Irritable bowel syndrome Mother    Cancer Mother    Hypertension Father    Diabetes Paternal Grandmother    Heart disease Neg Hx     Stroke Neg Hx     ALLERGIES:  is allergic to other, penicillins, shellfish-derived products, shrimp extract allergy skin test, shrimp [shellfish allergy], and sulfa antibiotics.  MEDICATIONS:  Current Outpatient Medications  Medication Sig Dispense Refill   acetaminophen-codeine (TYLENOL #4) 300-60 MG tablet Take 1 tablet by mouth every 4 (four) hours as needed. 15 tablet 0   chlorhexidine (HIBICLENS) 4 % external liquid Apply topically daily as needed (wash as needed). 120 mL 0   clindamycin (CLEOCIN T) 1 % external solution Apply topically 2 (two) times daily. 30 mL 0   colchicine 0.6 MG tablet Take 1 tablet (0.6 mg total) by mouth daily.  30 tablet 1   febuxostat (ULORIC) 40 MG tablet Take 1 tablet (40 mg total) by mouth daily. 30 tablet 1   ibuprofen (ADVIL) 800 MG tablet TAKE 1 TABLET BY MOUTH EVERY 8 HOURS AS NEEDED FOR MODERATE PAIN. EAT BEFORE TAKING MEDICATION 60 tablet 0   metoprolol succinate (TOPROL-XL) 25 MG 24 hr tablet Take 25 mg by mouth once.     ondansetron (ZOFRAN) 4 MG tablet TAKE 1 TABLET(4 MG) BY MOUTH EVERY 8 HOURS AS NEEDED FOR NAUSEA OR VOMITING 20 tablet 0   pantoprazole (PROTONIX) 40 MG tablet Take 40 mg by mouth daily.     SSD 1 % cream Apply 1 Application topically daily.     traZODone (DESYREL) 50 MG tablet Take 50-100 mg by mouth at bedtime as needed.     Vitamin D, Ergocalciferol, (DRISDOL) 1.25 MG (50000 UNIT) CAPS capsule Take 50,000 Units by mouth once a week.     clindamycin (CLEOCIN) 150 MG capsule Take by mouth.     clotrimazole (GYNE-LOTRIMIN) 1 % vaginal cream Place 1 Applicatorful vaginally at bedtime. (Patient not taking: Reported on 11/26/2021) 45 g 0   Cyanocobalamin (B-12) 1000 MCG SUBL Place 2,000 mcg under the tongue daily. 30 tablet 11   doxycycline (MONODOX) 100 MG capsule Take 100 mg by mouth every 12 (twelve) hours.     fluconazole (DIFLUCAN) 150 MG tablet Take by mouth.     HYDROcodone-acetaminophen (NORCO/VICODIN) 5-325 MG tablet Take 1  tablet by mouth every 6 (six) hours as needed. (Patient not taking: Reported on 11/26/2021)     ondansetron (ZOFRAN-ODT) 4 MG disintegrating tablet Take 4 mg by mouth every 8 (eight) hours as needed.     No current facility-administered medications for this visit.    Labs   .    Latest Ref Rng & Units 11/30/2021    3:33 PM 11/26/2021   12:41 PM 11/05/2021    4:16 AM  CBC  WBC 4.0 - 10.5 K/uL 14.2  10.1  8.7   Hemoglobin 12.0 - 15.0 g/dL 9.0  10.2  10.8   Hematocrit 36.0 - 46.0 % 28.4  31.8  34.5   Platelets 150 - 400 K/uL 572  541  582        Latest Ref Rng & Units 11/30/2021    3:33 PM 11/26/2021   12:41 PM 11/05/2021    4:16 AM  CMP  Glucose 70 - 99 mg/dL 109  101  96   BUN 6 - 20 mg/dL '10  9  7   '$ Creatinine 0.44 - 1.00 mg/dL 0.84  0.74  0.57   Sodium 135 - 145 mmol/L 141  136  138   Potassium 3.5 - 5.1 mmol/L 3.4  4.0  3.6   Chloride 98 - 111 mmol/L 112  104  109   CO2 22 - 32 mmol/L '20  24  21   '$ Calcium 8.9 - 10.3 mg/dL 9.0  9.7  9.0   Total Protein 6.5 - 8.1 g/dL 8.5  9.0    Total Bilirubin 0.3 - 1.2 mg/dL 0.4  0.3    Alkaline Phos 38 - 126 U/L 69  89    AST 15 - 41 U/L 12  14    ALT 0 - 44 U/L 10  11    .No results found for: "LDH"   PHYSICAL EXAMINATION: ECOG PERFORMANCE STATUS: 2 - Symptomatic, <50% confined to bed  . Vitals:   11/26/21 1304  BP: 95/72  Pulse: 89  Resp:  18  Temp: 97.9 F (36.6 C)  SpO2: 98%   Filed Weights   11/26/21 1304  Weight: 264 lb 3.2 oz (119.8 kg)   .Body mass index is 37.91 kg/m.  NAD GENERAL:alert, in no acute distress and comfortable EYES: conjunctiva are pink and non-injected, sclera anicteric OROPHARYNX: MMM, no exudates, no oropharyngeal erythema or ulceration NECK: supple, no JVD LYMPH:  no palpable lymphadenopathy in the cervical, axillary or inguinal regions LUNGS: clear to auscultation b/l with normal respiratory effort HEART: regular rate & rhythm ABDOMEN:  normoactive bowel sounds , non tender, not  distended. Extremity: no pedal edema PSYCH: alert & oriented x 3 with fluent speech NEURO: no focal motor/sensory deficits  LABORATORY DATA:  I have reviewed the data as listed  .    Latest Ref Rng & Units 11/30/2021    3:33 PM 11/26/2021   12:41 PM 11/05/2021    4:16 AM  CBC  WBC 4.0 - 10.5 K/uL 14.2  10.1  8.7   Hemoglobin 12.0 - 15.0 g/dL 9.0  10.2  10.8   Hematocrit 36.0 - 46.0 % 28.4  31.8  34.5   Platelets 150 - 400 K/uL 572  541  582    . CBC    Component Value Date/Time   WBC 14.2 (H) 11/30/2021 1533   RBC 3.64 (L) 11/30/2021 1533   HGB 9.0 (L) 11/30/2021 1533   HGB 10.2 (L) 11/26/2021 1241   HGB 8.7 (L) 01/11/2020 1015   HGB 9.5 (L) 12/18/2014 1518   HCT 28.4 (L) 11/30/2021 1533   HCT 31.9 (L) 01/11/2020 1015   HCT 31.4 (L) 12/18/2014 1518   PLT 572 (H) 11/30/2021 1533   PLT 541 (H) 11/26/2021 1241   PLT 639 (H) 01/11/2020 1015   MCV 78.0 (L) 11/30/2021 1533   MCV 71 (L) 01/11/2020 1015   MCV 67.0 (L) 12/18/2014 1518   MCH 24.7 (L) 11/30/2021 1533   MCHC 31.7 11/30/2021 1533   RDW 17.8 (H) 11/30/2021 1533   RDW 18.6 (H) 01/11/2020 1015   RDW 26.0 (H) 12/18/2014 1518   LYMPHSABS 1.7 11/30/2021 1533   LYMPHSABS 2.0 01/11/2020 1015   LYMPHSABS 1.6 12/18/2014 1518   MONOABS 0.7 11/30/2021 1533   MONOABS 0.3 12/18/2014 1518   EOSABS 0.1 11/30/2021 1533   EOSABS 0.2 01/11/2020 1015   BASOSABS 0.1 11/30/2021 1533   BASOSABS 0.1 01/11/2020 1015   BASOSABS 0.0 12/18/2014 1518    .    Latest Ref Rng & Units 11/30/2021    3:33 PM 11/26/2021   12:41 PM 11/05/2021    4:16 AM  CMP  Glucose 70 - 99 mg/dL 109  101  96   BUN 6 - 20 mg/dL '10  9  7   '$ Creatinine 0.44 - 1.00 mg/dL 0.84  0.74  0.57   Sodium 135 - 145 mmol/L 141  136  138   Potassium 3.5 - 5.1 mmol/L 3.4  4.0  3.6   Chloride 98 - 111 mmol/L 112  104  109   CO2 22 - 32 mmol/L '20  24  21   '$ Calcium 8.9 - 10.3 mg/dL 9.0  9.7  9.0   Total Protein 6.5 - 8.1 g/dL 8.5  9.0    Total Bilirubin 0.3 - 1.2 mg/dL  0.4  0.3    Alkaline Phos 38 - 126 U/L 69  89    AST 15 - 41 U/L 12  14    ALT 0 - 44 U/L 10  11     .  Lab Results  Component Value Date   IRON 11 (L) 11/26/2021   TIBC 288 11/26/2021   IRONPCTSAT 4 (L) 11/26/2021   (Iron and TIBC)  Lab Results  Component Value Date   FERRITIN 61 11/26/2021   B12 ---225    RADIOGRAPHIC STUDIES: I have personally reviewed the radiological images as listed and agreed with the findings in the report. DG Knee Complete 4 Views Right  Result Date: 11/30/2021 CLINICAL DATA:  Right knee pain EXAM: RIGHT KNEE - COMPLETE 4+ VIEW COMPARISON:  None Available. FINDINGS: No fracture or dislocation is seen. The joint spaces are preserved. Visualized soft tissues are within normal limits. No suprapatellar knee joint effusion. IMPRESSION: Negative. Electronically Signed   By: Julian Hy M.D.   On: 11/30/2021 19:42   DG Chest 2 View  Result Date: 11/30/2021 CLINICAL DATA:  Shortness of breath and chest pain. EXAM: CHEST - 2 VIEW COMPARISON:  06/01/2019 FINDINGS: The cardiomediastinal silhouette is unremarkable. Mild opacity in the MEDIAL RIGHT lung base noted, favor atelectasis over airspace disease. There is no evidence of pulmonary edema, suspicious pulmonary nodule/mass, pleural effusion, or pneumothorax. No acute bony abnormalities are identified. IMPRESSION: Mild opacity in the MEDIAL RIGHT lung base, favor atelectasis over airspace disease/pneumonia. Electronically Signed   By: Margarette Canada M.D.   On: 11/30/2021 16:17    ASSESSMENT & PLAN:   41 year old African American female with   #1 Microcytic anemia - likely due to severe iron deficiency.  Unclear etiology possibly chronic hemorrhoidal bleeding versus heavy periods versus other GI losses (?NSAIDS associated ulceration) versus blood loss from ulcerations related to her hidradenitis suppurativa. Additional Elements causing anemia include chronic inflammatory state due to hydradenitis suppurativa and  severe gout. Given the relatively high RBC numbers for the given hemoglobin cannot rule out underlying thalassemia/hemoglobinopathy.  #2 Thrombocytosis This is likely reactive from severe iron deficiency anemia, surgery, previous smoking, inflammation from her hidradenitis suppurativa and gout. Given her age unlikely to be a myeloproliferative neoplasm like essential thrombocytosis.  #3 B12 deficiency likely due to poor absorption #4 severe iron deficiency PLAN: -Labs done today were discussed in detail with the patient She continues to be anemic and Continue iron polysaccharide 150 mg p.o. daily Continue B12 replacement at 1000 mcg p.o. daily  FOLLOW UP: Venofer '300mg'$  weekly x 3 doses RTC with Dr Irene Limbo with labs in 5 months  The total time spent in the appointment was 32 minutes*.  All of the patient's questions were answered with apparent satisfaction. The patient knows to call the clinic with any problems, questions or concerns.   Sullivan Lone MD MS AAHIVMS [facility] Hematology/Oncology Physician [facility]  .*Total Encounter Time as defined by the Centers for Medicare and Medicaid Services includes, in addition to the face-to-face time of a patient visit (documented in the note above) non-face-to-face time: obtaining and reviewing outside history, ordering and reviewing medications, tests or procedures, care coordination (communications with other health care professionals or caregivers) and documentation in the medical record.

## 2021-12-03 ENCOUNTER — Encounter: Payer: Self-pay | Admitting: Hematology

## 2021-12-04 ENCOUNTER — Other Ambulatory Visit: Payer: Self-pay | Admitting: Student

## 2021-12-04 DIAGNOSIS — R600 Localized edema: Secondary | ICD-10-CM

## 2021-12-05 ENCOUNTER — Encounter (HOSPITAL_BASED_OUTPATIENT_CLINIC_OR_DEPARTMENT_OTHER): Payer: Self-pay

## 2021-12-05 ENCOUNTER — Other Ambulatory Visit: Payer: Self-pay

## 2021-12-05 ENCOUNTER — Ambulatory Visit (HOSPITAL_BASED_OUTPATIENT_CLINIC_OR_DEPARTMENT_OTHER): Admit: 2021-12-05 | Payer: Medicare (Managed Care) | Admitting: Surgery

## 2021-12-05 ENCOUNTER — Ambulatory Visit
Admission: RE | Admit: 2021-12-05 | Discharge: 2021-12-05 | Disposition: A | Discharge status: Home / Self Care | Payer: Medicare (Managed Care) | Source: Ambulatory Visit | Attending: Student | Admitting: Student

## 2021-12-05 ENCOUNTER — Inpatient Hospital Stay: Payer: Medicare (Managed Care) | Attending: Hematology

## 2021-12-05 ENCOUNTER — Ambulatory Visit (HOSPITAL_BASED_OUTPATIENT_CLINIC_OR_DEPARTMENT_OTHER): Payer: Medicare (Managed Care) | Admitting: Physician Assistant

## 2021-12-05 ENCOUNTER — Other Ambulatory Visit: Payer: Self-pay | Admitting: Hematology

## 2021-12-05 VITALS — BP 105/68 | HR 79 | Temp 98.5°F | Resp 18

## 2021-12-05 DIAGNOSIS — D509 Iron deficiency anemia, unspecified: Secondary | ICD-10-CM

## 2021-12-05 DIAGNOSIS — D508 Other iron deficiency anemias: Secondary | ICD-10-CM

## 2021-12-05 DIAGNOSIS — T148XXA Other injury of unspecified body region, initial encounter: Secondary | ICD-10-CM

## 2021-12-05 DIAGNOSIS — R600 Localized edema: Secondary | ICD-10-CM

## 2021-12-05 LAB — CULTURE, BLOOD (ROUTINE X 2)
Culture: NO GROWTH
Culture: NO GROWTH
Special Requests: ADEQUATE
Special Requests: ADEQUATE
Specimen Description: ADEQUATE

## 2021-12-05 SURGERY — EXCISION, HIDRADENITIS, AXILLA
Anesthesia: General | Laterality: Right

## 2021-12-05 MED ORDER — SODIUM CHLORIDE 0.9 % IV SOLN: Freq: Once | INTRAVENOUS | Status: AC

## 2021-12-05 MED ORDER — ACETAMINOPHEN 325 MG PO TABS
650.0000 mg | ORAL_TABLET | Freq: Once | ORAL | Status: AC
  Administered 2021-12-05: 650 mg via ORAL
  Filled 2021-12-05: qty 2

## 2021-12-05 MED ORDER — LORATADINE 10 MG PO TABS
10.0000 mg | ORAL_TABLET | Freq: Once | ORAL | Status: AC
  Administered 2021-12-05: 10 mg via ORAL
  Filled 2021-12-05: qty 1

## 2021-12-05 MED ORDER — SODIUM CHLORIDE 0.9 % IV SOLN
300.0000 mg | Freq: Once | INTRAVENOUS | Status: AC
  Administered 2021-12-05: 300 mg via INTRAVENOUS
  Filled 2021-12-05: qty 300

## 2021-12-05 NOTE — Patient Instructions (Signed)

## 2021-12-05 NOTE — Progress Notes (Signed)
Symptom Management Consult note [place]    Patient Care Team: Cipriano Mile, NP as PCP - General    Name of the patient: Amanda Davenport  [id number]  [birth year]   Date of visit: 12/05/2021    Chief complaint/ Reason for visit- IV iron extravasation  Oncology History   No history exists.    Current Therapy: IV venofer   Interval history- Amanda Davenport is a 41 y.o. with pertinent history of iron deficiency anemia seen in the infusion center for IV iron extravasation happening just prior to my evaluation. Patient reports no pain when IV iron infusion started. IV in right hand. She fell asleep and when she woke up noticed a burning sensation in her hand. 10 minutes later when RN asked her how she was feeling she informed her of the pain. She rates burning pain 3/10 in severity. Pain does not radiate. No modifying factors. IV was removed. She denies any numbness or tingling.       ROS  All other systems are reviewed and are negative for acute change except as noted in the HPI.    Allergies  Allergen Reactions   Other Other (See Comments)    All Antibiotics cause severe vaginal yeast infections   Penicillins Hives, Itching, Swelling and Rash    Has patient had a PCN reaction causing immediate rash, facial/tongue/throat swelling, SOB or lightheadedness with hypotension: Yes Has patient had a PCN reaction causing severe rash involving mucus membranes or skin necrosis: Yes Has patient had a PCN reaction that required hospitalization Yes Has patient had a PCN reaction occurring within the last 10 years: Yes If all of the above answers are "NO", then may proceed with Cephalosporin use.  Has patient had a PCN reaction causing immediate rash, facial/tongue/throat swelling, SOB or lightheadedness with hypotension: Yes Has patient had a PCN reaction causing severe rash involving mucus membranes or skin necrosis: Yes Has patient had a PCN reaction that required  hospitalization Yes Has patient had a PCN reaction occurring within the last 10 years: Yes If all of the above answers are "NO", then may proceed with Cephalosporin use. Has patient had a PCN reaction causing immediate rash, facial/tongue/throat swelling, SOB or lightheadedness with hypotension: Yes Has patient had a PCN reaction causing severe rash involving mucus membranes or skin necrosis: Yes Has patient had a PCN reaction that required hospitalization Yes Has patient had a PCN reaction occurring within the last 10 years: Yes If all of the above answers are "NO", then may proceed with Cephalosporin use.   Shellfish-Derived Products Hives   Shrimp Extract Allergy Skin Test    Shrimp [Shellfish Allergy] Hives   Sulfa Antibiotics Rash     Past Medical History:  Diagnosis Date   Allergy    Anemia    receives transfusions periodically   Anxiety    Arrhythmia    Chronic headache    Depression    Dysrhythmia    Gout 12/2018   Knee pain    Nearsightedness    wears glasses   Neuropathy    Obesity    Pneumonia    Pre-diabetes    Recurrent boils    WPW (Wolff-Parkinson-White syndrome)      Past Surgical History:  Procedure Laterality Date   ADJACENT TISSUE TRANSFER/TISSUE REARRANGEMENT Left 07/28/2019   Procedure: ADJACENT TISSUE TRANSFER TO LEFT AXILLA GREATER THAN 100 CM SQUARED;  Surgeon: Irene Limbo, MD;  Location: [facility];  Service: Plastics;  Laterality:  Left;   HYDRADENITIS EXCISION Left 07/28/2019   Procedure: EXCISION LEFT HIDRADENITIS AXILLA;  Surgeon: Clovis Riley, MD;  Location: [facility];  Service: General;  Laterality: Left;   TONSILLECTOMY      Social History   Socioeconomic History   Marital status: Single    Spouse name: Not on file   Number of children: 0   Years of education: Not on file   Highest education level: Not on file  Occupational History   Occupation: disabled  Tobacco Use   Smoking status: Former    Years: 0.50    Types:  Cigarettes, Cigars    Quit date: 04/30/2019    Years since quitting: 2.6   Smokeless tobacco: Never   Tobacco comments:    smokes black and milds - last use early-mid August  Vaping Use   Vaping Use: Never used  Substance and Sexual Activity   Alcohol use: No   Drug use: Yes    Frequency: 7.0 times per week    Types: Marijuana   Sexual activity: Yes    Partners: Male    Birth control/protection: Condom  Other Topics Concern   Not on file  Social History Narrative   Not on file   Social Determinants of Health   Financial Resource Strain: Low Risk  (12/28/2020)   Overall Financial Resource Strain (CARDIA)    Difficulty of Paying Living Expenses: Not hard at all  Food Insecurity: No Food Insecurity (12/28/2020)   Hunger Vital Sign    Worried About Running Out of Food in the Last Year: Never true    [place] in the Last Year: Never true  Transportation Needs: No Transportation Needs (12/28/2020)   PRAPARE - Hydrologist (Medical): No    Lack of Transportation (Non-Medical): No  Physical Activity: Inactive (12/28/2020)   Exercise Vital Sign    Days of Exercise per Week: 0 days    Minutes of Exercise per Session: 0 min  Stress: Stress Concern Present (12/28/2020)   [place]    Feeling of Stress : To some extent  Social Connections: Socially Isolated (12/28/2020)   Social Connection and Isolation Panel [NHANES]    Frequency of Communication with Friends and Family: More than three times a week    Frequency of Social Gatherings with Friends and Family: Never    Attends Religious Services: Never    Marine scientist or Organizations: No    Attends Archivist Meetings: Never    Marital Status: Never married  Intimate Partner Violence: Not At Risk (12/28/2020)   Humiliation, Afraid, Rape, and Kick questionnaire    Fear of Current or Ex-Partner: No    Emotionally  Abused: No    Physically Abused: No    Sexually Abused: No    Family History  Problem Relation Age of Onset   Breast cancer Mother    Pulmonary embolism Mother        died of PE   Colon cancer Mother    Irritable bowel syndrome Mother    Cancer Mother    Hypertension Father    Diabetes Paternal Grandmother    Heart disease Neg Hx    Stroke Neg Hx      Current Outpatient Medications:    acetaminophen-codeine (TYLENOL #4) 300-60 MG tablet, Take 1 tablet by mouth every 4 (four) hours as needed., Disp: 15 tablet, Rfl: 0   chlorhexidine (HIBICLENS) 4 %  external liquid, Apply topically daily as needed (wash as needed)., Disp: 120 mL, Rfl: 0   clindamycin (CLEOCIN T) 1 % external solution, Apply topically 2 (two) times daily., Disp: 30 mL, Rfl: 0   clindamycin (CLEOCIN) 150 MG capsule, Take by mouth., Disp: , Rfl:    clotrimazole (GYNE-LOTRIMIN) 1 % vaginal cream, Place 1 Applicatorful vaginally at bedtime. (Patient not taking: Reported on 11/26/2021), Disp: 45 g, Rfl: 0   colchicine 0.6 MG tablet, Take 1 tablet (0.6 mg total) by mouth daily., Disp: 30 tablet, Rfl: 1   Cyanocobalamin (B-12) 1000 MCG SUBL, Place 2,000 mcg under the tongue daily., Disp: 30 tablet, Rfl: 11   doxycycline (MONODOX) 100 MG capsule, Take 100 mg by mouth every 12 (twelve) hours., Disp: , Rfl:    febuxostat (ULORIC) 40 MG tablet, Take 1 tablet (40 mg total) by mouth daily., Disp: 30 tablet, Rfl: 1   fluconazole (DIFLUCAN) 150 MG tablet, Take by mouth., Disp: , Rfl:    HYDROcodone-acetaminophen (NORCO/VICODIN) 5-325 MG tablet, Take 1 tablet by mouth every 6 (six) hours as needed. (Patient not taking: Reported on 11/26/2021), Disp: , Rfl:    ibuprofen (ADVIL) 800 MG tablet, TAKE 1 TABLET BY MOUTH EVERY 8 HOURS AS NEEDED FOR MODERATE PAIN. EAT BEFORE TAKING MEDICATION, Disp: 60 tablet, Rfl: 0   metoprolol succinate (TOPROL-XL) 25 MG 24 hr tablet, Take 25 mg by mouth once., Disp: , Rfl:    ondansetron (ZOFRAN) 4 MG  tablet, TAKE 1 TABLET(4 MG) BY MOUTH EVERY 8 HOURS AS NEEDED FOR NAUSEA OR VOMITING, Disp: 20 tablet, Rfl: 0   ondansetron (ZOFRAN-ODT) 4 MG disintegrating tablet, Take 4 mg by mouth every 8 (eight) hours as needed., Disp: , Rfl:    pantoprazole (PROTONIX) 40 MG tablet, Take 40 mg by mouth daily., Disp: , Rfl:    SSD 1 % cream, Apply 1 Application topically daily., Disp: , Rfl:    traZODone (DESYREL) 50 MG tablet, Take 50-100 mg by mouth at bedtime as needed., Disp: , Rfl:    Vitamin D, Ergocalciferol, (DRISDOL) 1.25 MG (50000 UNIT) CAPS capsule, Take 50,000 Units by mouth once a week., Disp: , Rfl:   PHYSICAL EXAM: ECOG FS:1 - Symptomatic but completely ambulatory   T: 98.5   BP: 105/68  HR: 79   Resp: 18   O2: 100% Physical Exam Vitals and nursing note reviewed.  Constitutional:      Appearance: She is well-developed. She is not ill-appearing or toxic-appearing.  HENT:     Head: Normocephalic and atraumatic.     Nose: Nose normal.  Eyes:     General: No scleral icterus.       Right eye: No discharge.        Left eye: No discharge.     Conjunctiva/sclera: Conjunctivae normal.  Neck:     Vascular: No JVD.  Cardiovascular:     Rate and Rhythm: Normal rate and regular rhythm.     Pulses: Normal pulses.          Radial pulses are 2+ on the right side and 2+ on the left side.     Heart sounds: Normal heart sounds.  Pulmonary:     Effort: Pulmonary effort is normal.     Breath sounds: Normal breath sounds.  Abdominal:     General: There is no distension.  Musculoskeletal:        General: Normal range of motion.     Cervical back: Normal range of motion.  Skin:  General: Skin is warm and dry.     Capillary Refill: Capillary refill takes less than 2 seconds.     Comments: Minimal swelling on dorsum of right hand. No tenderness.   Equal tactile temperature in bilateral upper extremities.   Neurological:     Mental Status: She is oriented to person, place, and time.     GCS:  GCS eye subscore is 4. GCS verbal subscore is 5. GCS motor subscore is 6.     Comments: Fluent speech, no facial droop.  Psychiatric:        Behavior: Behavior normal.        LABORATORY DATA: I have reviewed the data as listed    Latest Ref Rng & Units 11/30/2021    3:33 PM 11/26/2021   12:41 PM 11/05/2021    4:16 AM  CBC  WBC 4.0 - 10.5 K/uL 14.2  10.1  8.7   Hemoglobin 12.0 - 15.0 g/dL 9.0  10.2  10.8   Hematocrit 36.0 - 46.0 % 28.4  31.8  34.5   Platelets 150 - 400 K/uL 572  541  582         Latest Ref Rng & Units 11/30/2021    3:33 PM 11/26/2021   12:41 PM 11/05/2021    4:16 AM  CMP  Glucose 70 - 99 mg/dL 109  101  96   BUN 6 - 20 mg/dL '10  9  7   '$ Creatinine 0.44 - 1.00 mg/dL 0.84  0.74  0.57   Sodium 135 - 145 mmol/L 141  136  138   Potassium 3.5 - 5.1 mmol/L 3.4  4.0  3.6   Chloride 98 - 111 mmol/L 112  104  109   CO2 22 - 32 mmol/L '20  24  21   '$ Calcium 8.9 - 10.3 mg/dL 9.0  9.7  9.0   Total Protein 6.5 - 8.1 g/dL 8.5  9.0    Total Bilirubin 0.3 - 1.2 mg/dL 0.4  0.3    Alkaline Phos 38 - 126 U/L 69  89    AST 15 - 41 U/L 12  14    ALT 0 - 44 U/L 10  11         RADIOGRAPHIC STUDIES (from last 24 hours if applicable) I have personally reviewed the radiological images as listed and agreed with the findings in the report. US Venous Img Lower Unilateral Right (DVT)  Result Date: 12/05/2021 CLINICAL DATA:  Right lower leg swelling for 3 weeks. EXAM: RIGHT LOWER EXTREMITY VENOUS DOPPLER ULTRASOUND TECHNIQUE: Gray-scale sonography with compression, as well as color and duplex ultrasound, were performed to evaluate the deep venous system(s) from the level of the common femoral vein through the popliteal and proximal calf veins. COMPARISON:  None Available. FINDINGS: VENOUS Normal compressibility of the common femoral, superficial femoral, and popliteal veins, as well as the visualized calf veins. Visualized portions of profunda femoral vein and great saphenous vein  unremarkable. No filling defects to suggest DVT on grayscale or color Doppler imaging. Doppler waveforms show normal direction of venous flow, normal respiratory plasticity and response to augmentation. The contralateral common femoral vein could not be visualized due to skin boils over the expected location of the vein. OTHER None. Limitations: None. IMPRESSION: Negative for DVT. Electronically Signed   By: Inge Rise M.D.   On: 12/05/2021 12:13       ASSESSMENT & PLAN: Patient is a 41 y.o. female  with oncologic history of iron deficiency anemia  followed by Dr. Irene Limbo.  I have viewed most recent oncology note and lab work.   #) IV extravasation -IV extravasation while receiving IV venofer, approximately halfway through infusion. -IV was removed from hand and ice pack applied. There was minimal swelling and discoloration around IV site. Strong radial pulse, brisk cap refill, compartments soft in RUE. -Advised patient to elevate extremity and apply ice. -She does not want to finish treatment today with a new IV. She is scheduled to have next infusion 12/12/21. I will have phone visit with patient 12/09/21 for follow up. Discussed possibility of skin staining. -Strict ED precautions discussed should symptoms worsen.   #) Iron deficiency anemia -Scheduled for iron transfusion 12/12/21 - Next appointment with heme/onc is 04/29/21.   Visit Diagnosis: No diagnosis found.   No orders of the defined types were placed in this encounter.   All questions were answered. The patient knows to call the clinic with any problems, questions or concerns. No barriers to learning was detected.  I have spent a total of 10 minutes minutes of face-to-face and non-face-to-face time, preparing to see the patient, obtaining and/or reviewing separately obtained history, performing a medically appropriate examination, counseling and educating the patient, documenting clinical information in the electronic  health record, and care coordination (communications with other health care professionals or caregivers).    Thank you for allowing me to participate in the care of this patient.    Barrie Folk, PA-C Department of Hematology/Oncology [facility] Phone: [phone]  [phone]    12/05/2021 9:00 PM

## 2021-12-05 NOTE — Progress Notes (Signed)
Patient had IV extravasate while receiving iron infusion. Checked on patient several times and IV was intact. Patient stated "I woke up, moved my hand, and then felt a burning sensation." She did not notify this RN of any burning or leaking when she first noticed/felt it. I went to check on the patient and that is when she notified me. I asked how long the burning had been going on, she stated "about 10 minutes." Some bruising/discoloration noted around the IV site, ice pack applied. Anda Kraft, [place], brought chairside to assess. She gave patient instructions and will be following up on 12/09/21 via phone visit. Patient refused to have new IV placed to receive the rest of her infusion. Minimal swelling noted around IV site. Vitals stable. This RN dressed IV site with gauze and coban. Gave extra ice pack to take home with her.

## 2021-12-09 ENCOUNTER — Inpatient Hospital Stay (HOSPITAL_BASED_OUTPATIENT_CLINIC_OR_DEPARTMENT_OTHER): Payer: Medicare (Managed Care) | Admitting: Physician Assistant

## 2021-12-09 DIAGNOSIS — T148XXA Other injury of unspecified body region, initial encounter: Secondary | ICD-10-CM

## 2021-12-09 DIAGNOSIS — D509 Iron deficiency anemia, unspecified: Secondary | ICD-10-CM | POA: Diagnosis not present

## 2021-12-09 NOTE — Progress Notes (Signed)
I connected with [name] on 12/09/21 at 11:00 AM EDT by telephone and verified that I am speaking with the correct person using two identifiers.   I discussed the limitations, risks, security and privacy concerns of performing an evaluation and management service by telemedicine and the availability of in-person appointments. I also discussed with the patient that there may be a patient responsible charge related to this service. The patient expressed understanding and agreed to proceed.   Other persons participating in the visit and their role in the encounter: none   Patient's location: home Provider's [facility]       Symptom Management Consult note [place]    Patient Care Team: Cipriano Mile, NP as PCP - General    Name of the patient: Amanda Davenport  [id number]  20-Feb-1981   Date of visit: 12/09/2021    Chief complaint/ Reason for visit- recheck hand pain  Oncology History   No history exists.    Current Therapy: IV venofer  Interval history-  [name] is a 41 yo female with pertinent history of iron deficiency anemia contacted via telephone today for follow up of hand pain after IV iron extravasation that occurred 12/05/21. Patient was seen by me immediately after extravasation happened. She tells me today that she still has mild pain to her right hand that she describes as soreness. Pain is constant. Pain improves with her pain medicine and after icing. Pain is currently 4/10 in severity. She still has some swelling on her hand and states it is less than it was initially.  She denies any numbness or tingling. She denies any signs of infection.   ROS  All other systems are reviewed and are negative for acute change except as noted in the HPI.    Allergies  Allergen Reactions   Other Other (See Comments)    All Antibiotics cause severe vaginal yeast infections   Penicillins Hives, Itching, Swelling and Rash    Has patient had  a PCN reaction causing immediate rash, facial/tongue/throat swelling, SOB or lightheadedness with hypotension: Yes Has patient had a PCN reaction causing severe rash involving mucus membranes or skin necrosis: Yes Has patient had a PCN reaction that required hospitalization Yes Has patient had a PCN reaction occurring within the last 10 years: Yes If all of the above answers are "NO", then may proceed with Cephalosporin use.  Has patient had a PCN reaction causing immediate rash, facial/tongue/throat swelling, SOB or lightheadedness with hypotension: Yes Has patient had a PCN reaction causing severe rash involving mucus membranes or skin necrosis: Yes Has patient had a PCN reaction that required hospitalization Yes Has patient had a PCN reaction occurring within the last 10 years: Yes If all of the above answers are "NO", then may proceed with Cephalosporin use. Has patient had a PCN reaction causing immediate rash, facial/tongue/throat swelling, SOB or lightheadedness with hypotension: Yes Has patient had a PCN reaction causing severe rash involving mucus membranes or skin necrosis: Yes Has patient had a PCN reaction that required hospitalization Yes Has patient had a PCN reaction occurring within the last 10 years: Yes If all of the above answers are "NO", then may proceed with Cephalosporin use.   Shellfish-Derived Products Hives   Shrimp Extract Allergy Skin Test    Shrimp [Shellfish Allergy] Hives   Sulfa Antibiotics Rash     Past Medical History:  Diagnosis Date   Allergy    Anemia    receives transfusions  periodically   Anxiety    Arrhythmia    Chronic headache    Depression    Dysrhythmia    Gout 12/2018   Knee pain    Nearsightedness    wears glasses   Neuropathy    Obesity    Pneumonia    Pre-diabetes    Recurrent boils    WPW (Wolff-Parkinson-White syndrome)      Past Surgical History:  Procedure Laterality Date   ADJACENT TISSUE TRANSFER/TISSUE  REARRANGEMENT Left 07/28/2019   Procedure: ADJACENT TISSUE TRANSFER TO LEFT AXILLA GREATER THAN 100 CM SQUARED;  Surgeon: Irene Limbo, MD;  Location: [facility];  Service: Plastics;  Laterality: Left;   HYDRADENITIS EXCISION Left 07/28/2019   Procedure: EXCISION LEFT HIDRADENITIS AXILLA;  Surgeon: Clovis Riley, MD;  Location: [facility];  Service: General;  Laterality: Left;   TONSILLECTOMY      Social History   Socioeconomic History   Marital status: Single    Spouse name: Not on file   Number of children: 0   Years of education: Not on file   Highest education level: Not on file  Occupational History   Occupation: disabled  Tobacco Use   Smoking status: Former    Years: 0.50    Types: Cigarettes, Cigars    Quit date: 04/30/2019    Years since quitting: 2.6   Smokeless tobacco: Never   Tobacco comments:    smokes black and milds - last use early-mid August  Vaping Use   Vaping Use: Never used  Substance and Sexual Activity   Alcohol use: No   Drug use: Yes    Frequency: 7.0 times per week    Types: Marijuana   Sexual activity: Yes    Partners: Male    Birth control/protection: Condom  Other Topics Concern   Not on file  Social History Narrative   Not on file   Social Determinants of Health   Financial Resource Strain: Low Risk  (12/28/2020)   Overall Financial Resource Strain (CARDIA)    Difficulty of Paying Living Expenses: Not hard at all  Food Insecurity: No Food Insecurity (12/28/2020)   Hunger Vital Sign    Worried About Running Out of Food in the Last Year: Never true    [place] in the Last Year: Never true  Transportation Needs: No Transportation Needs (12/28/2020)   PRAPARE - Hydrologist (Medical): No    Lack of Transportation (Non-Medical): No  Physical Activity: Inactive (12/28/2020)   Exercise Vital Sign    Days of Exercise per Week: 0 days    Minutes of Exercise per Session: 0 min  Stress: Stress Concern Present  (12/28/2020)   [place]    Feeling of Stress : To some extent  Social Connections: Socially Isolated (12/28/2020)   Social Connection and Isolation Panel [NHANES]    Frequency of Communication with Friends and Family: More than three times a week    Frequency of Social Gatherings with Friends and Family: Never    Attends Religious Services: Never    Marine scientist or Organizations: No    Attends Archivist Meetings: Never    Marital Status: Never married  Intimate Partner Violence: Not At Risk (12/28/2020)   Humiliation, Afraid, Rape, and Kick questionnaire    Fear of Current or Ex-Partner: No    Emotionally Abused: No    Physically Abused: No    Sexually  Abused: No    Family History  Problem Relation Age of Onset   Breast cancer Mother    Pulmonary embolism Mother        died of PE   Colon cancer Mother    Irritable bowel syndrome Mother    Cancer Mother    Hypertension Father    Diabetes Paternal Grandmother    Heart disease Neg Hx    Stroke Neg Hx      Current Outpatient Medications:    acetaminophen-codeine (TYLENOL #4) 300-60 MG tablet, Take 1 tablet by mouth every 4 (four) hours as needed., Disp: 15 tablet, Rfl: 0   chlorhexidine (HIBICLENS) 4 % external liquid, Apply topically daily as needed (wash as needed)., Disp: 120 mL, Rfl: 0   clindamycin (CLEOCIN T) 1 % external solution, Apply topically 2 (two) times daily., Disp: 30 mL, Rfl: 0   clindamycin (CLEOCIN) 150 MG capsule, Take by mouth., Disp: , Rfl:    clotrimazole (GYNE-LOTRIMIN) 1 % vaginal cream, Place 1 Applicatorful vaginally at bedtime. (Patient not taking: Reported on 11/26/2021), Disp: 45 g, Rfl: 0   colchicine 0.6 MG tablet, Take 1 tablet (0.6 mg total) by mouth daily., Disp: 30 tablet, Rfl: 1   Cyanocobalamin (B-12) 1000 MCG SUBL, Place 2,000 mcg under the tongue daily., Disp: 30 tablet, Rfl: 11   doxycycline (MONODOX) 100  MG capsule, Take 100 mg by mouth every 12 (twelve) hours., Disp: , Rfl:    febuxostat (ULORIC) 40 MG tablet, Take 1 tablet (40 mg total) by mouth daily., Disp: 30 tablet, Rfl: 1   fluconazole (DIFLUCAN) 150 MG tablet, Take by mouth., Disp: , Rfl:    HYDROcodone-acetaminophen (NORCO/VICODIN) 5-325 MG tablet, Take 1 tablet by mouth every 6 (six) hours as needed. (Patient not taking: Reported on 11/26/2021), Disp: , Rfl:    ibuprofen (ADVIL) 800 MG tablet, TAKE 1 TABLET BY MOUTH EVERY 8 HOURS AS NEEDED FOR MODERATE PAIN. EAT BEFORE TAKING MEDICATION, Disp: 60 tablet, Rfl: 0   metoprolol succinate (TOPROL-XL) 25 MG 24 hr tablet, Take 25 mg by mouth once., Disp: , Rfl:    ondansetron (ZOFRAN) 4 MG tablet, TAKE 1 TABLET(4 MG) BY MOUTH EVERY 8 HOURS AS NEEDED FOR NAUSEA OR VOMITING, Disp: 20 tablet, Rfl: 0   ondansetron (ZOFRAN-ODT) 4 MG disintegrating tablet, Take 4 mg by mouth every 8 (eight) hours as needed., Disp: , Rfl:    pantoprazole (PROTONIX) 40 MG tablet, Take 40 mg by mouth daily., Disp: , Rfl:    SSD 1 % cream, Apply 1 Application topically daily., Disp: , Rfl:    traZODone (DESYREL) 50 MG tablet, Take 50-100 mg by mouth at bedtime as needed., Disp: , Rfl:    Vitamin D, Ergocalciferol, (DRISDOL) 1.25 MG (50000 UNIT) CAPS capsule, Take 50,000 Units by mouth once a week., Disp: , Rfl:   PHYSICAL EXAM: ECOG FS:1 - Symptomatic but completely ambulatory   There were no vitals filed for this visit.  Patient speaking in clear sentences, no respiratory distress    LABORATORY DATA: I have reviewed the data as listed    Latest Ref Rng & Units 11/30/2021    3:33 PM 11/26/2021   12:41 PM 11/05/2021    4:16 AM  CBC  WBC 4.0 - 10.5 K/uL 14.2  10.1  8.7   Hemoglobin 12.0 - 15.0 g/dL 9.0  10.2  10.8   Hematocrit 36.0 - 46.0 % 28.4  31.8  34.5   Platelets 150 - 400 K/uL 572  541  582         Latest Ref Rng & Units 11/30/2021    3:33 PM 11/26/2021   12:41 PM 11/05/2021    4:16 AM  CMP  Glucose  70 - 99 mg/dL 109  101  96   BUN 6 - 20 mg/dL '10  9  7   '$ Creatinine 0.44 - 1.00 mg/dL 0.84  0.74  0.57   Sodium 135 - 145 mmol/L 141  136  138   Potassium 3.5 - 5.1 mmol/L 3.4  4.0  3.6   Chloride 98 - 111 mmol/L 112  104  109   CO2 22 - 32 mmol/L '20  24  21   '$ Calcium 8.9 - 10.3 mg/dL 9.0  9.7  9.0   Total Protein 6.5 - 8.1 g/dL 8.5  9.0    Total Bilirubin 0.3 - 1.2 mg/dL 0.4  0.3    Alkaline Phos 38 - 126 U/L 69  89    AST 15 - 41 U/L 12  14    ALT 0 - 44 U/L 10  11         RADIOGRAPHIC STUDIES: I have personally reviewed the radiological images as listed and agreed with the findings in the report. No images are attached to the encounter. US Venous Img Lower Unilateral Right (DVT)  Result Date: 12/05/2021 CLINICAL DATA:  Right lower leg swelling for 3 weeks. EXAM: RIGHT LOWER EXTREMITY VENOUS DOPPLER ULTRASOUND TECHNIQUE: Gray-scale sonography with compression, as well as color and duplex ultrasound, were performed to evaluate the deep venous system(s) from the level of the common femoral vein through the popliteal and proximal calf veins. COMPARISON:  None Available. FINDINGS: VENOUS Normal compressibility of the common femoral, superficial femoral, and popliteal veins, as well as the visualized calf veins. Visualized portions of profunda femoral vein and great saphenous vein unremarkable. No filling defects to suggest DVT on grayscale or color Doppler imaging. Doppler waveforms show normal direction of venous flow, normal respiratory plasticity and response to augmentation. The contralateral common femoral vein could not be visualized due to skin boils over the expected location of the vein. OTHER None. Limitations: None. IMPRESSION: Negative for DVT. Electronically Signed   By: Inge Rise M.D.   On: 12/05/2021 12:13   DG Knee Complete 4 Views Right  Result Date: 11/30/2021 CLINICAL DATA:  Right knee pain EXAM: RIGHT KNEE - COMPLETE 4+ VIEW COMPARISON:  None Available. FINDINGS: No  fracture or dislocation is seen. The joint spaces are preserved. Visualized soft tissues are within normal limits. No suprapatellar knee joint effusion. IMPRESSION: Negative. Electronically Signed   By: Julian Hy M.D.   On: 11/30/2021 19:42   DG Chest 2 View  Result Date: 11/30/2021 CLINICAL DATA:  Shortness of breath and chest pain. EXAM: CHEST - 2 VIEW COMPARISON:  06/01/2019 FINDINGS: The cardiomediastinal silhouette is unremarkable. Mild opacity in the MEDIAL RIGHT lung base noted, favor atelectasis over airspace disease. There is no evidence of pulmonary edema, suspicious pulmonary nodule/mass, pleural effusion, or pneumothorax. No acute bony abnormalities are identified. IMPRESSION: Mild opacity in the MEDIAL RIGHT lung base, favor atelectasis over airspace disease/pneumonia. Electronically Signed   By: Margarette Canada M.D.   On: 11/30/2021 16:17     ASSESSMENT & PLAN: Patient is a 41 y.o. female hematologic history of iron deficiency anemia followed by Dr. Irene Limbo.   #)IV iron extravasation Follow up check on right hand after IV iron extravasation x 4 days ago. Patient still having pain although it  is tolerable per patient. . Discussed signs and symptoms to watch for that would warrant ED evaluation. Patient to continue  symptom management at home.  Dr. Irene Limbo made aware.   #) Iron deficiency anemia She will return for already scheduled iron infusion 12/12/21.  Visit Diagnosis: 1. Extravasation injury   2. Iron deficiency anemia, unspecified iron deficiency anemia type      No orders of the defined types were placed in this encounter.   All questions were answered. The patient knows to call the clinic with any problems, questions or concerns. No barriers to learning was detected.  Time spent with patient on telephone encounter: 10 minutes   Thank you for allowing me to participate in the care of this patient.    Barrie Folk, PA-C Department of  Hematology/Oncology [facility] at [facility] Phone: [phone]  [phone]    12/09/2021 11:19 AM

## 2021-12-12 ENCOUNTER — Inpatient Hospital Stay: Payer: Medicare (Managed Care)

## 2021-12-12 VITALS — BP 117/75 | HR 88 | Temp 98.0°F | Resp 18

## 2021-12-12 DIAGNOSIS — D509 Iron deficiency anemia, unspecified: Secondary | ICD-10-CM | POA: Diagnosis not present

## 2021-12-12 DIAGNOSIS — D508 Other iron deficiency anemias: Secondary | ICD-10-CM

## 2021-12-12 MED ORDER — SODIUM CHLORIDE 0.9 % IV SOLN: Freq: Once | INTRAVENOUS | Status: AC

## 2021-12-12 MED ORDER — ACETAMINOPHEN 325 MG PO TABS
650.0000 mg | ORAL_TABLET | Freq: Once | ORAL | Status: AC
  Administered 2021-12-12: 650 mg via ORAL
  Filled 2021-12-12: qty 2

## 2021-12-12 MED ORDER — SODIUM CHLORIDE 0.9 % IV SOLN
300.0000 mg | Freq: Once | INTRAVENOUS | Status: AC
  Administered 2021-12-12: 300 mg via INTRAVENOUS
  Filled 2021-12-12: qty 300

## 2021-12-12 MED ORDER — LORATADINE 10 MG PO TABS
10.0000 mg | ORAL_TABLET | Freq: Once | ORAL | Status: AC
  Administered 2021-12-12: 10 mg via ORAL
  Filled 2021-12-12: qty 1

## 2021-12-12 NOTE — Patient Instructions (Signed)

## 2021-12-19 ENCOUNTER — Inpatient Hospital Stay: Payer: Medicare (Managed Care)

## 2021-12-23 NOTE — Progress Notes (Signed)
INR ordered as a part of sepsis evaluation/order set to evaluate for coagulopathy secondary to sepsis.

## 2022-03-05 ENCOUNTER — Other Ambulatory Visit (HOSPITAL_COMMUNITY): Payer: Self-pay

## 2022-03-05 ENCOUNTER — Encounter: Payer: Self-pay | Admitting: Hematology

## 2022-03-06 ENCOUNTER — Other Ambulatory Visit (HOSPITAL_COMMUNITY): Payer: Self-pay

## 2022-03-06 ENCOUNTER — Other Ambulatory Visit: Payer: Self-pay

## 2022-03-06 MED ORDER — OXYCODONE-ACETAMINOPHEN 10-325 MG PO TABS
ORAL_TABLET | ORAL | 0 refills | Status: DC
  Filled 2022-03-06: qty 90, 30d supply, fill #0

## 2022-03-23 ENCOUNTER — Other Ambulatory Visit (HOSPITAL_COMMUNITY): Payer: Self-pay

## 2022-03-23 ENCOUNTER — Other Ambulatory Visit: Payer: Self-pay

## 2022-03-23 MED ORDER — CYCLOBENZAPRINE HCL 5 MG PO TABS
5.0000 mg | ORAL_TABLET | Freq: Three times a day (TID) | ORAL | 0 refills | Status: AC | PRN
  Filled 2022-03-23 – 2022-04-03 (×2): qty 45, 15d supply, fill #0

## 2022-03-23 MED ORDER — OXYCODONE-ACETAMINOPHEN 10-325 MG PO TABS
1.0000 | ORAL_TABLET | Freq: Three times a day (TID) | ORAL | 0 refills | Status: DC | PRN
  Filled 2022-04-03: qty 90, 30d supply, fill #0

## 2022-03-26 ENCOUNTER — Encounter: Payer: Self-pay | Admitting: Hematology

## 2022-04-01 ENCOUNTER — Other Ambulatory Visit (HOSPITAL_COMMUNITY): Payer: Self-pay

## 2022-04-03 ENCOUNTER — Other Ambulatory Visit (HOSPITAL_COMMUNITY): Payer: Self-pay

## 2022-04-07 ENCOUNTER — Ambulatory Visit: Payer: Medicare HMO | Attending: Internal Medicine | Admitting: Internal Medicine

## 2022-04-07 ENCOUNTER — Encounter: Payer: Self-pay | Admitting: Internal Medicine

## 2022-04-07 VITALS — BP 102/68 | HR 94 | Ht 70.0 in | Wt 281.0 lb

## 2022-04-07 DIAGNOSIS — I456 Pre-excitation syndrome: Secondary | ICD-10-CM

## 2022-04-07 NOTE — Progress Notes (Signed)
HPI Ms. Shoe returns today for followup. She is a pleasant 42 yo woman with a h/o ventricular pre-excitation and sinus tachycardia and morbid obesity. She has had palpitations with HR's in the 110-120 range. I do not have any ECGs. She denies exertional chest pain. No syncope. She has not had ventricular pre-excitation on her ECG for many years. Her last pre-excited ECG was in 2018 showing a right mid septal AP. She has not undergone catheter ablation. Sounds like she has undergone eval at her primary MD's but I do not have any details.  Allergies  Allergen Reactions   Other Other (See Comments)    All Antibiotics cause severe vaginal yeast infections   Penicillins Hives, Itching, Swelling and Rash    Has patient had a PCN reaction causing immediate rash, facial/tongue/throat swelling, SOB or lightheadedness with hypotension: Yes Has patient had a PCN reaction causing severe rash involving mucus membranes or skin necrosis: Yes Has patient had a PCN reaction that required hospitalization Yes Has patient had a PCN reaction occurring within the last 10 years: Yes If all of the above answers are "NO", then may proceed with Cephalosporin use.  Has patient had a PCN reaction causing immediate rash, facial/tongue/throat swelling, SOB or lightheadedness with hypotension: Yes Has patient had a PCN reaction causing severe rash involving mucus membranes or skin necrosis: Yes Has patient had a PCN reaction that required hospitalization Yes Has patient had a PCN reaction occurring within the last 10 years: Yes If all of the above answers are "NO", then may proceed with Cephalosporin use. Has patient had a PCN reaction causing immediate rash, facial/tongue/throat swelling, SOB or lightheadedness with hypotension: Yes Has patient had a PCN reaction causing severe rash involving mucus membranes or skin necrosis: Yes Has patient had a PCN reaction that required hospitalization Yes Has patient had a  PCN reaction occurring within the last 10 years: Yes If all of the above answers are "NO", then may proceed with Cephalosporin use.   Shellfish-Derived Products Hives   Shrimp Extract Allergy Skin Test    Shrimp [Shellfish Allergy] Hives   Sulfa Antibiotics Rash     Current Outpatient Medications  Medication Sig Dispense Refill   cyclobenzaprine (FLEXERIL) 5 MG tablet Take 1 tablet (5 mg total) by mouth 3 (three) times daily as needed for muscle spasm 45 tablet 0   metoprolol succinate (TOPROL-XL) 25 MG 24 hr tablet Take 25 mg by mouth daily.     ondansetron (ZOFRAN-ODT) 4 MG disintegrating tablet Take 4 mg by mouth every 8 (eight) hours as needed.     oxyCODONE-acetaminophen (PERCOCET) 10-325 MG tablet Take 1 tablet by mouth 3 (three) times daily as needed for pain 90 tablet 0   pantoprazole (PROTONIX) 40 MG tablet Take 40 mg by mouth daily.     traZODone (DESYREL) 50 MG tablet Take 50-100 mg by mouth at bedtime as needed.     No current facility-administered medications for this visit.     Past Medical History:  Diagnosis Date   Allergy    Anemia    receives transfusions periodically   Anxiety    Arrhythmia    Chronic headache    Depression    Dysrhythmia    Gout 12/2018   Knee pain    Nearsightedness    wears glasses   Neuropathy    Obesity    Pneumonia    Pre-diabetes    Recurrent boils    WPW (Wolff-Parkinson-White syndrome)  ROS:   All systems reviewed and negative except as noted in the HPI.   Past Surgical History:  Procedure Laterality Date   ADJACENT TISSUE TRANSFER/TISSUE REARRANGEMENT Left 07/28/2019   Procedure: ADJACENT TISSUE TRANSFER TO LEFT AXILLA GREATER THAN 100 CM SQUARED;  Surgeon: Irene Limbo, MD;  Location: [facility];  Service: Plastics;  Laterality: Left;   HYDRADENITIS EXCISION Left 07/28/2019   Procedure: EXCISION LEFT HIDRADENITIS AXILLA;  Surgeon: Clovis Riley, MD;  Location: [facility];  Service: General;  Laterality: Left;    TONSILLECTOMY       Family History  Problem Relation Age of Onset   Breast cancer Mother    Pulmonary embolism Mother        died of PE   Colon cancer Mother    Irritable bowel syndrome Mother    Cancer Mother    Hypertension Father    Diabetes Paternal Grandmother    Heart disease Neg Hx    Stroke Neg Hx      Social History   Socioeconomic History   Marital status: Single    Spouse name: Not on file   Number of children: 0   Years of education: Not on file   Highest education level: Not on file  Occupational History   Occupation: disabled  Tobacco Use   Smoking status: Former    Years: 0.50    Types: Cigarettes, Cigars    Quit date: 04/30/2019    Years since quitting: 2.9   Smokeless tobacco: Never   Tobacco comments:    smokes black and milds - last use early-mid August  Vaping Use   Vaping Use: Never used  Substance and Sexual Activity   Alcohol use: No   Drug use: Yes    Frequency: 7.0 times per week    Types: Marijuana   Sexual activity: Yes    Partners: Male    Birth control/protection: Condom  Other Topics Concern   Not on file  Social History Narrative   Not on file   Social Determinants of Health   Financial Resource Strain: Low Risk  (12/28/2020)   Overall Financial Resource Strain (CARDIA)    Difficulty of Paying Living Expenses: Not hard at all  Food Insecurity: No Food Insecurity (12/28/2020)   Hunger Vital Sign    Worried About Running Out of Food in the Last Year: Never true    Ran Out of Food in the Last Year: Never true  Transportation Needs: No Transportation Needs (12/28/2020)   PRAPARE - Hydrologist (Medical): No    Lack of Transportation (Non-Medical): No  Physical Activity: Inactive (12/28/2020)   Exercise Vital Sign    Days of Exercise per Week: 0 days    Minutes of Exercise per Session: 0 min  Stress: Stress Concern Present (12/28/2020)   [place]    Feeling of Stress : To some extent  Social Connections: Socially Isolated (12/28/2020)   Social Connection and Isolation Panel [NHANES]    Frequency of Communication with Friends and Family: More than three times a week    Frequency of Social Gatherings with Friends and Family: Never    Attends Religious Services: Never    Marine scientist or Organizations: No    Attends Archivist Meetings: Never    Marital Status: Never married  Intimate Partner Violence: Not At Risk (12/28/2020)   Humiliation, Afraid, Rape, and Kick questionnaire  Fear of Current or Ex-Partner: No    Emotionally Abused: No    Physically Abused: No    Sexually Abused: No     BP 102/68   Pulse 94   Ht '5\' 10"'$  (1.778 m)   Wt 281 lb (127.5 kg)   SpO2 97%   BMI 40.32 kg/m   Physical Exam:  obese appearing middle aged woman, NAD HEENT: Unremarkable Neck:  No JVD, no thyromegally Lymphatics:  No adenopathy Back:  No CVA tenderness Lungs:  Clear HEART:  Regular rate rhythm, no murmurs, no rubs, no clicks Abd:  soft, positive bowel sounds, no organomegally, no rebound, no guarding Ext:  2 plus pulses, no edema, no cyanosis, no clubbing Skin:  No rashes no nodules Neuro:  CN II through XII intact, motor grossly intact  Assess/Plan: WPW 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 I have recommended watchful waiting. She notes that she has had some "heart testing" I think with an echo and I have asked her to get a copy of her test results and send to my office. Obesity - she is encouraged to lose weight.  Disp - additional eval after we have seen the results of her cardiac testing.  Carleene Overlie Beuford Garcilazo,MD

## 2022-04-07 NOTE — Patient Instructions (Signed)
Please have your records from [facility] sent to Dr. Lovena Le  Medication Instructions:  Your physician recommends that you continue on your current medications as directed. Please refer to the Current Medication list given to you today.  *If you need a refill on your cardiac medications before your next appointment, please call your pharmacy*  Follow-Up: At [facility], you and your health needs are our priority.  As part of our continuing mission to provide you with exceptional heart care, we have created designated Provider Care Teams.  These Care Teams include your primary Cardiologist (physician) and Advanced Practice Providers (APPs -  Physician Assistants and Nurse Practitioners) who all work together to provide you with the care you need, when you need it.  Your next appointment:   As needed based on review of records  Important Information About Sugar

## 2022-04-28 ENCOUNTER — Other Ambulatory Visit: Payer: Self-pay

## 2022-04-28 DIAGNOSIS — D508 Other iron deficiency anemias: Secondary | ICD-10-CM

## 2022-04-29 ENCOUNTER — Inpatient Hospital Stay: Payer: Medicare HMO | Attending: Hematology

## 2022-04-29 ENCOUNTER — Inpatient Hospital Stay (HOSPITAL_BASED_OUTPATIENT_CLINIC_OR_DEPARTMENT_OTHER): Payer: Medicare HMO | Admitting: Hematology

## 2022-04-29 VITALS — BP 124/80 | HR 77 | Temp 97.9°F | Resp 14 | Ht 70.0 in | Wt 287.1 lb

## 2022-04-29 DIAGNOSIS — N920 Excessive and frequent menstruation with regular cycle: Secondary | ICD-10-CM | POA: Diagnosis not present

## 2022-04-29 DIAGNOSIS — E538 Deficiency of other specified B group vitamins: Secondary | ICD-10-CM | POA: Insufficient documentation

## 2022-04-29 DIAGNOSIS — M109 Gout, unspecified: Secondary | ICD-10-CM | POA: Diagnosis not present

## 2022-04-29 DIAGNOSIS — D75838 Other thrombocytosis: Secondary | ICD-10-CM | POA: Insufficient documentation

## 2022-04-29 DIAGNOSIS — D508 Other iron deficiency anemias: Secondary | ICD-10-CM

## 2022-04-29 DIAGNOSIS — D509 Iron deficiency anemia, unspecified: Secondary | ICD-10-CM | POA: Insufficient documentation

## 2022-04-29 LAB — CMP (CANCER CENTER ONLY)
ALT: 8 U/L (ref 0–44)
AST: 12 U/L — ABNORMAL LOW (ref 15–41)
Albumin: 3.4 g/dL — ABNORMAL LOW (ref 3.5–5.0)
Alkaline Phosphatase: 73 U/L (ref 38–126)
Anion gap: 5 (ref 5–15)
BUN: 9 mg/dL (ref 6–20)
CO2: 24 mmol/L (ref 22–32)
Calcium: 8.7 mg/dL — ABNORMAL LOW (ref 8.9–10.3)
Chloride: 107 mmol/L (ref 98–111)
Creatinine: 0.76 mg/dL (ref 0.44–1.00)
GFR, Estimated: >60 mL/min (ref >60)
Glucose, Bld: 96 mg/dL (ref 70–99)
Potassium: 3.6 mmol/L (ref 3.5–5.1)
Sodium: 136 mmol/L (ref 135–145)
Total Bilirubin: 0.3 mg/dL (ref 0.3–1.2)
Total Protein: 8.5 g/dL — ABNORMAL HIGH (ref 6.5–8.1)

## 2022-04-29 LAB — CBC WITH DIFFERENTIAL (CANCER CENTER ONLY)
Abs Immature Granulocytes: 0.02 10*3/uL (ref 0.00–0.07)
Basophils Absolute: 0 10*3/uL (ref 0.0–0.1)
Basophils Relative: 1 %
Eosinophils Absolute: 0.1 10*3/uL (ref 0.0–0.5)
Eosinophils Relative: 1 %
HCT: 34.1 % — ABNORMAL LOW (ref 36.0–46.0)
Hemoglobin: 11.1 g/dL — ABNORMAL LOW (ref 12.0–15.0)
Immature Granulocytes: 0 %
Lymphocytes Relative: 36 %
Lymphs Abs: 3.2 10*3/uL (ref 0.7–4.0)
MCH: 26.6 pg (ref 26.0–34.0)
MCHC: 32.6 g/dL (ref 30.0–36.0)
MCV: 81.6 fL (ref 80.0–100.0)
Monocytes Absolute: 0.5 10*3/uL (ref 0.1–1.0)
Monocytes Relative: 6 %
Neutro Abs: 5 10*3/uL (ref 1.7–7.7)
Neutrophils Relative %: 56 %
Platelet Count: 354 10*3/uL (ref 150–400)
RBC: 4.18 MIL/uL (ref 3.87–5.11)
RDW: 16.2 % — ABNORMAL HIGH (ref 11.5–15.5)
WBC Count: 8.9 10*3/uL (ref 4.0–10.5)
nRBC: 0 % (ref 0.0–0.2)

## 2022-04-29 LAB — IRON AND IRON BINDING CAPACITY (CC-WL,HP ONLY)
Iron: 68 ug/dL (ref 28–170)
Saturation Ratios: 20 % (ref 10.4–31.8)
TIBC: 343 ug/dL (ref 250–450)
UIBC: 275 ug/dL (ref 148–442)

## 2022-04-29 LAB — FERRITIN: Ferritin: 11 ng/mL (ref 11–307)

## 2022-04-29 NOTE — Progress Notes (Signed)
HEMATOLOGY/ONCOLOGY CLINIC NOTE  Date of Service: 04/29/22   PCP [facility] (Gout management) Gennette Pac MD Cardiology - [facility] [place]  CHIEF COMPLAINTS/PURPOSE OF CONSULTATION:  For continued management of iron deficiency anemia with reactive thrombocytosis  HISTORY OF PRESENTING ILLNESS:  Amanda Davenport is a wonderful 42 y.o. female who has been referred to [place] for evaluation and management of thrombocytosis.  She was last seen by [place] in 2016 and was lost to follow-up due to issues with her medical insurance.  Patient has been referred back to [place] for evaluation of her intermittent thrombocytosis and severe iron deficiency anemia. The pt reports her hidradenitis suppurativa has been quite bothersome and she had surgery on 4/23-- left HS excision--still has an open surgical wound with some oozing .  Had blood loss during surgery as well.  Patient notes she has had significant issues with severe gout attacks- prednisone, allopurinol/colchicine/febostat.  She is following with Dr.Khiem Gardiner Sleeper MD for her rheumatology cares.  She continues to have heavy menstrual periods lasting 5 days of which 4 are very heavy. Has been taking Geritol  1 month and also has continued taking prenatal vitamins No personal history of VTE No FHx of VTE, bleeding or clotting disorders but mother had PE in the setting of surgery and active breast cancer. Quit smoking tobacco- 07/2019. 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  On review of systems, pt reports severe fatigue and some ice cravings.  No fevers chills night sweats or new bone pains.  INTERVAL HISTORY:  [name] is here for continued  evaluation and management of her iron deficiency anemia.   Patient was last seen by me on 11/26/2021 and she was doing well overall.   Patient reports she has been doing well without any new medical concerns since our last visit. Patient notes she only got 2 of her IV infusion. She notes that her HS has been stable.   Patient notes that she still has her menstrual cycle and they have not been heavy. She reports of pollinated cyst with mild blood loss and is following up with her OBGYN.   She denies fever, chills, night sweats, bone pain, abdominal pain, back pain, abnormal bleeding, abnormal bowel movement, chest pain, or leg swelling.   Patient denies taking iron supplements or other vitamin supplements.   MEDICAL HISTORY:  Past Medical History:  Diagnosis Date   Allergy    Anemia    receives transfusions periodically   Anxiety    Arrhythmia    Chronic headache    Depression    Dysrhythmia    Gout 12/2018   Knee pain    Nearsightedness    wears glasses   Neuropathy    Obesity    Pneumonia    Pre-diabetes    Recurrent boils    WPW (Wolff-Parkinson-White syndrome)     SURGICAL HISTORY: Past Surgical History:  Procedure Laterality Date   ADJACENT TISSUE TRANSFER/TISSUE REARRANGEMENT Left 07/28/2019   Procedure: ADJACENT TISSUE TRANSFER TO LEFT AXILLA GREATER THAN 100 CM SQUARED;  Surgeon: Irene Limbo, MD;  Location: [facility];  Service: Plastics;  Laterality: Left;  HYDRADENITIS EXCISION Left 07/28/2019   Procedure: EXCISION LEFT HIDRADENITIS AXILLA;  Surgeon: Clovis Riley, MD;  Location: [facility];  Service: General;  Laterality: Left;   TONSILLECTOMY      SOCIAL HISTORY: Social History   Socioeconomic History   Marital status: Single    Spouse name: Not on file   Number of children: 0   Years of education: Not on file   Highest education level: Not on file  Occupational History   Occupation: disabled  Tobacco Use   Smoking status: Former    Years: 0.50     Types: Cigarettes, Cigars    Quit date: 04/30/2019    Years since quitting: 3.0   Smokeless tobacco: Never   Tobacco comments:    smokes black and milds - last use early-mid August  Vaping Use   Vaping Use: Never used  Substance and Sexual Activity   Alcohol use: No   Drug use: Yes    Frequency: 7.0 times per week    Types: Marijuana   Sexual activity: Yes    Partners: Male    Birth control/protection: Condom  Other Topics Concern   Not on file  Social History Narrative   Not on file   Social Determinants of Health   Financial Resource Strain: Low Risk  (12/28/2020)   Overall Financial Resource Strain (CARDIA)    Difficulty of Paying Living Expenses: Not hard at all  Food Insecurity: No Food Insecurity (12/28/2020)   Hunger Vital Sign    Worried About Running Out of Food in the Last Year: Never true    [place] in the Last Year: Never true  Transportation Needs: No Transportation Needs (12/28/2020)   PRAPARE - Hydrologist (Medical): No    Lack of Transportation (Non-Medical): No  Physical Activity: Inactive (12/28/2020)   Exercise Vital Sign    Days of Exercise per Week: 0 days    Minutes of Exercise per Session: 0 min  Stress: Stress Concern Present (12/28/2020)   [place]    Feeling of Stress : To some extent  Social Connections: Socially Isolated (12/28/2020)   Social Connection and Isolation Panel [NHANES]    Frequency of Communication with Friends and Family: More than three times a week    Frequency of Social Gatherings with Friends and Family: Never    Attends Religious Services: Never    Marine scientist or Organizations: No    Attends Archivist Meetings: Never    Marital Status: Never married  Intimate Partner Violence: Not At Risk (12/28/2020)   Humiliation, Afraid, Rape, and Kick questionnaire    Fear of Current or Ex-Partner: No     Emotionally Abused: No    Physically Abused: No    Sexually Abused: No    FAMILY HISTORY: Family History  Problem Relation Age of Onset   Breast cancer Mother    Pulmonary embolism Mother        died of PE   Colon cancer Mother    Irritable bowel syndrome Mother    Cancer Mother    Hypertension Father    Diabetes Paternal Grandmother    Heart disease Neg Hx    Stroke Neg Hx     ALLERGIES:  is allergic to other, penicillins, shellfish-derived products, shrimp extract allergy skin test, shrimp [shellfish allergy], and sulfa antibiotics.  MEDICATIONS:  Current Outpatient Medications  Medication Sig Dispense Refill  cyclobenzaprine (FLEXERIL) 5 MG tablet Take 1 tablet (5 mg total) by mouth 3 (three) times daily as needed for muscle spasm 45 tablet 0   metoprolol succinate (TOPROL-XL) 25 MG 24 hr tablet Take 25 mg by mouth daily.     ondansetron (ZOFRAN-ODT) 4 MG disintegrating tablet Take 4 mg by mouth every 8 (eight) hours as needed.     oxyCODONE-acetaminophen (PERCOCET) 10-325 MG tablet Take 1 tablet by mouth 3 (three) times daily as needed for pain 90 tablet 0   pantoprazole (PROTONIX) 40 MG tablet Take 40 mg by mouth daily.     traZODone (DESYREL) 50 MG tablet Take 50-100 mg by mouth at bedtime as needed.     No current facility-administered medications for this visit.   PHYSICAL EXAMINATION: ECOG PERFORMANCE STATUS: 2 - Symptomatic, <50% confined to bed  . Vitals:   04/29/22 1235  BP: 124/80  Pulse: 77  Resp: 14  Temp: 97.9 F (36.6 C)  SpO2: 99%    Filed Weights   04/29/22 1235  Weight: 287 lb 1.6 oz (130.2 kg)    .Body mass index is 41.19 kg/m.  NAD GENERAL:alert, in no acute distress and comfortable EYES: conjunctiva are pink and non-injected, sclera anicteric OROPHARYNX: MMM, no exudates, no oropharyngeal erythema or ulceration NECK: supple, no JVD LYMPH:  no palpable lymphadenopathy in the cervical, axillary or inguinal regions LUNGS: clear to  auscultation b/l with normal respiratory effort HEART: regular rate & rhythm ABDOMEN:  normoactive bowel sounds , non tender, not distended. Extremity: no pedal edema PSYCH: alert & oriented x 3 with fluent speech NEURO: no focal motor/sensory deficits  LABORATORY DATA:  I have reviewed the data as listed  .    Latest Ref Rng & Units 04/29/2022   11:58 AM 11/30/2021    3:33 PM 11/26/2021   12:41 PM  CBC  WBC 4.0 - 10.5 K/uL 8.9  14.2  10.1   Hemoglobin 12.0 - 15.0 g/dL 11.1  9.0  10.2   Hematocrit 36.0 - 46.0 % 34.1  28.4  31.8   Platelets 150 - 400 K/uL 354  572  541    . CBC    Component Value Date/Time   WBC 8.9 04/29/2022 1158   WBC 14.2 (H) 11/30/2021 1533   RBC 4.18 04/29/2022 1158   HGB 11.1 (L) 04/29/2022 1158   HGB 8.7 (L) 01/11/2020 1015   HGB 9.5 (L) 12/18/2014 1518   HCT 34.1 (L) 04/29/2022 1158   HCT 31.9 (L) 01/11/2020 1015   HCT 31.4 (L) 12/18/2014 1518   PLT 354 04/29/2022 1158   PLT 639 (H) 01/11/2020 1015   MCV 81.6 04/29/2022 1158   MCV 71 (L) 01/11/2020 1015   MCV 67.0 (L) 12/18/2014 1518   MCH 26.6 04/29/2022 1158   MCHC 32.6 04/29/2022 1158   RDW 16.2 (H) 04/29/2022 1158   RDW 18.6 (H) 01/11/2020 1015   RDW 26.0 (H) 12/18/2014 1518   LYMPHSABS 3.2 04/29/2022 1158   LYMPHSABS 2.0 01/11/2020 1015   LYMPHSABS 1.6 12/18/2014 1518   MONOABS 0.5 04/29/2022 1158   MONOABS 0.3 12/18/2014 1518   EOSABS 0.1 04/29/2022 1158   EOSABS 0.2 01/11/2020 1015   BASOSABS 0.0 04/29/2022 1158   BASOSABS 0.1 01/11/2020 1015   BASOSABS 0.0 12/18/2014 1518    .    Latest Ref Rng & Units 04/29/2022   11:58 AM 11/30/2021    3:33 PM 11/26/2021   12:41 PM  CMP  Glucose 70 - 99 mg/dL 96  109  101   BUN 6 - 20 mg/dL '9  10  9   '$ Creatinine 0.44 - 1.00 mg/dL 0.76  0.84  0.74   Sodium 135 - 145 mmol/L 136  141  136   Potassium 3.5 - 5.1 mmol/L 3.6  3.4  4.0   Chloride 98 - 111 mmol/L 107  112  104   CO2 22 - 32 mmol/L '24  20  24   '$ Calcium 8.9 - 10.3 mg/dL 8.7  9.0   9.7   Total Protein 6.5 - 8.1 g/dL 8.5  8.5  9.0   Total Bilirubin 0.3 - 1.2 mg/dL 0.3  0.4  0.3   Alkaline Phos 38 - 126 U/L 73  69  89   AST 15 - 41 U/L '12  12  14   '$ ALT 0 - 44 U/L '8  10  11    '$ . Lab Results  Component Value Date   IRON 68 04/29/2022   TIBC 343 04/29/2022   IRONPCTSAT 20 04/29/2022   (Iron and TIBC)  Lab Results  Component Value Date   FERRITIN 11 04/29/2022    . Lab Results  Component Value Date   IRON 11 (L) 11/26/2021   TIBC 288 11/26/2021   IRONPCTSAT 4 (L) 11/26/2021   (Iron and TIBC)  Lab Results  Component Value Date   FERRITIN 61 11/26/2021   B12 ---225    RADIOGRAPHIC STUDIES: I have personally reviewed the radiological images as listed and agreed with the findings in the report. No results found.  ASSESSMENT & PLAN:   42 year old African American female with   #1 Microcytic anemia - likely due to severe iron deficiency.  Unclear etiology possibly chronic hemorrhoidal bleeding versus heavy periods versus other GI losses (?NSAIDS associated ulceration) versus blood loss from ulcerations related to her hidradenitis suppurativa. Additional Elements causing anemia include chronic inflammatory state due to hydradenitis suppurativa and severe gout. Given the relatively high RBC numbers for the given hemoglobin cannot rule out underlying thalassemia/hemoglobinopathy.  #2 Thrombocytosis This is likely reactive from severe iron deficiency anemia, surgery, previous smoking, inflammation from her hidradenitis suppurativa and gout.  #3 B12 deficiency likely due to poor absorption #4 severe iron deficiency  PLAN: -Discussed lab results from today, 04/29/2022, with patient. CBC shows hemoglobin of 11.1 and hematocrit of 34.1.thrombocytosis resolved PLT 354k, WBC WNL  CMP is stable, -recommended influenza vaccine, COVID-19 Booster, and other age appropriate vaccines.  -ferritin 11, iron saturation improved to 20% -Will offer patient option  for 2 additional doses of Venofer to help her anemia and target ferritin>50  -continue B12 1000 mcg daily FOLLOW-UP: Venofer '300mg'$  weekly x 2 doses RTC with Dr Irene Limbo with labs in 6 months   The total time spent in the appointment was 20 minutes* .  All of the patient's questions were answered with apparent satisfaction. The patient knows to call the clinic with any problems, questions or concerns.   Sullivan Lone MD MS AAHIVMS [facility] Hematology/Oncology Physician [facility]  .*Total Encounter Time as defined by the Centers for Medicare and Medicaid Services includes, in addition to the face-to-face time of a patient visit (documented in the note above) non-face-to-face time: obtaining and reviewing outside history, ordering and reviewing medications, tests or procedures, care coordination (communications with other health care professionals or caregivers) and documentation in the medical record.   Zettie Cooley, am acting as a Education administrator for Sullivan Lone, MD.

## 2022-05-04 ENCOUNTER — Other Ambulatory Visit (HOSPITAL_COMMUNITY): Payer: Self-pay

## 2022-05-05 ENCOUNTER — Encounter: Payer: Self-pay | Admitting: Hematology

## 2022-05-05 ENCOUNTER — Other Ambulatory Visit (HOSPITAL_COMMUNITY): Payer: Self-pay

## 2022-05-06 ENCOUNTER — Telehealth: Payer: Self-pay | Admitting: Hematology

## 2022-05-06 ENCOUNTER — Other Ambulatory Visit (HOSPITAL_COMMUNITY): Payer: Self-pay

## 2022-05-06 MED ORDER — OXYCODONE-ACETAMINOPHEN 10-325 MG PO TABS
1.0000 | ORAL_TABLET | Freq: Three times a day (TID) | ORAL | 0 refills | Status: AC
  Filled 2022-05-06: qty 90, 30d supply, fill #0

## 2022-05-06 NOTE — Telephone Encounter (Signed)
Per 1/31 IB, msg left

## 2022-05-08 ENCOUNTER — Inpatient Hospital Stay: Payer: Medicare HMO | Attending: Hematology

## 2022-05-15 ENCOUNTER — Inpatient Hospital Stay: Payer: Medicare HMO

## 2022-05-21 ENCOUNTER — Ambulatory Visit: Payer: Self-pay | Admitting: Surgery

## 2022-05-21 NOTE — H&P (Signed)
Enrique Sack [id number]   Referring Provider:  Rochel Brome, MD   Subjective   Chief Complaint: new problem (Pilonidal cyst )     History of Present Illness: 42 year old woman known to be following excision of left axillary hidradenitis with rotational flap closure by Dr. Iran Planas in 2021- this has healed and has remained asymptomatic.  In the interim, she did follow-up with [facility] dermatology and tried various therapies including immunomodulators.  She had ongoing pain and drainage in the right axilla and is interested in having this side excised. We had planed to proceed with excision of the right side; sequently patient was admitted to the hospital with acute flare and possible sepsis related to this.  She decided not to pursue the scheduled procedure.  Her latest follow-up with dermatology was 2 months ago, she received a infliximab infusion at that time.  Seems to be helping.  Today she presents for evaluation of a pilonidal cyst.  She has been experiencing pain and drainage from the superior natal cleft for at least 6 months intermittently.  Denies any previous procedures in this area.  No previous hidradenitis episodes in this area, although when she first tried Humira she had a breakout of the perineal region.   Review of Systems: A complete review of systems was obtained from the patient.  I have reviewed this information and discussed as appropriate with the patient.  See HPI as well for other ROS.   Medical History: Past Medical History:  Diagnosis Date   Anemia    Anxiety    Arthritis    Asthma, unspecified asthma severity, unspecified whether complicated, unspecified whether persistent     There is no problem list on file for this patient.   Past Surgical History:  Procedure Laterality Date   axillary surgery       Allergies  Allergen Reactions   Other Other (See Comments)    All Antibiotics cause severe vaginal yeast infections   Penicillins Hives,  Itching, Rash and Swelling    Has patient had a PCN reaction causing immediate rash, facial/tongue/throat swelling, SOB or lightheadedness with hypotension: Yes  Has patient had a PCN reaction causing severe rash involving mucus membranes or skin necrosis: Yes  Has patient had a PCN reaction that required hospitalization Yes  Has patient had a PCN reaction occurring within the last 10 years: Yes  If all of the above answers are "NO", then may proceed with Cephalosporin use.  Has patient had a PCN reaction causing immediate rash, facial/tongue/throat swelling, SOB or lightheadedness with hypotension: Yes  Has patient had a PCN reaction causing severe rash involving mucus membranes or skin necrosis: Yes  Has patient had a PCN reaction that required hospitalization Yes  Has patient had a PCN reaction occurring within the last 10 years: Yes  If all of the above answers are "NO", then may proceed with Cephalosporin use.  Has patient had a PCN reaction causing immediate rash, facial/tongue/throat swelling, SOB or lightheadedness with hypotension: Yes  Has patient had a PCN reaction causing severe rash involving mucus membranes or skin necrosis: Yes  Has patient had a PCN reaction that required hospitalization Yes  Has patient had a PCN reaction occurring within the last 10 years: Yes  If all of the above answers are "NO", then may proceed with Cephalosporin use.    Has patient had a PCN reaction causing immediate rash, facial/tongue/throat swelling, SOB or lightheadedness with hypotension: Yes  Has patient had a PCN reaction causing  severe rash involving mucus membranes or skin necrosis: Yes  Has patient had a PCN reaction that required hospitalization Yes  Has patient had a PCN reaction occurring within the last 10 years: Yes  If all of the above answers are "NO", then may proceed with Cephalosporin use.  Has patient had a PCN reaction causing immediate rash, facial/tongue/throat swelling, SOB or  lightheadedness with hypotension: Yes  Has patient had a PCN reaction causing severe rash involving mucus membranes or skin necrosis: Yes  Has patient had a PCN reaction that required hospitalization Yes  Has patient had a PCN reaction occurring within the last 10 years: Yes  If all of the above answers are "NO", then may proceed with Cephalosporin use.   Shellfish Containing Products Hives   Sulfa (Sulfonamide Antibiotics) Rash    Current Outpatient Medications on File Prior to Visit  Medication Sig Dispense Refill   colchicine (COLCRYS) 0.6 mg tablet Take 1 tablet by mouth once daily     ergocalciferol, vitamin D2, 1,250 mcg (50,000 unit) capsule Take 1 capsule by mouth every 7 (seven) days     ibuprofen (MOTRIN) 800 MG tablet TAKE 1 TABLET BY MOUTH EVERY 8 HOURS AS NEEDED FOR MODERATE PAIN. EAT BEFORE TAKING MEDICATION     traZODone (DESYREL) 50 MG tablet TAKE 1 TO 2 TABLETS BY MOUTH DAILY AT BEDTIME AS NEEDED FOR SLEEP     acetaminophen-codeine (TYLENOL #4) 300-60 mg per tablet TAKE 1 TABLET BY MOUTH EVERY 4 HOURS FOR 10 DAYS AS NEEDED (Patient not taking: Reported on 05/21/2022)     clindamycin (CLEOCIN) 150 MG capsule  (Patient not taking: Reported on 05/21/2022)     Febuxostat (ULORIC) 40 mg tablet  (Patient not taking: Reported on 05/21/2022)     No current facility-administered medications on file prior to visit.    Family History  Problem Relation Age of Onset   Stroke Mother    Colon cancer Mother    Breast cancer Mother    High blood pressure (Hypertension) Father    Hyperlipidemia (Elevated cholesterol) Father      Social History   Tobacco Use  Smoking Status Former   Types: Cigarettes  Smokeless Tobacco Never     Social History   Socioeconomic History   Marital status: Single  Tobacco Use   Smoking status: Former    Types: Cigarettes   Smokeless tobacco: Never  Vaping Use   Vaping Use: Former  Substance and Sexual Activity   Alcohol use: Never   Drug use:  Yes    Objective:    Vitals:   05/21/22 1023  Pulse: 80  Temp: 36.8 C (98.2 F)  Weight: (!) 130.6 kg (288 lb)  Height: 180.3 cm (5' 11"$ )     Body mass index is 40.17 kg/m.  Alert well-appearing Unlabored respirations There is an area of fluctuance and tenderness 3 x 2 cm with a 1 cm chronic granulation tissue bud at the superior aspect of the right side of the natal cleft.  No obvious midline pit, but there is desquamation/excoriation of the natal cleft inferior to this.  There is a healed scar without underlying tenderness or palpable cyst to the left of the midline about 5 cm below the top of the natal cleft.  Assessment and Plan:  Diagnoses and all orders for this visit:  Pilonidal abscess  History is consistent, with intermittent pain and drainage.  I recommend proceeding with trephination and we discussed the procedure in detail as well as postoperative  recovery, timeline, and wound care needs.  We discussed risks of bleeding, infection, pain, scarring, recurrence of disease.  Questions welcomed and answered to her satisfaction.  She would like to proceed with scheduling.   Liberato Stansbery Raquel James, MD

## 2022-05-21 NOTE — H&P (View-Only) (Signed)
Amanda Davenport [id number]   Referring Provider:  Rochel Brome, MD   Subjective   Chief Complaint: new problem (Pilonidal cyst )     History of Present Illness: 42 year old woman known to be following excision of left axillary hidradenitis with rotational flap closure by Dr. Iran Planas in 2021- this has healed and has remained asymptomatic.  In the interim, she did follow-up with [facility] dermatology and tried various therapies including immunomodulators.  She had ongoing pain and drainage in the right axilla and is interested in having this side excised. We had planed to proceed with excision of the right side; sequently patient was admitted to the hospital with acute flare and possible sepsis related to this.  She decided not to pursue the scheduled procedure.  Her latest follow-up with dermatology was 2 months ago, she received a infliximab infusion at that time.  Seems to be helping.  Today she presents for evaluation of a pilonidal cyst.  She has been experiencing pain and drainage from the superior natal cleft for at least 6 months intermittently.  Denies any previous procedures in this area.  No previous hidradenitis episodes in this area, although when she first tried Humira she had a breakout of the perineal region.   Review of Systems: A complete review of systems was obtained from the patient.  I have reviewed this information and discussed as appropriate with the patient.  See HPI as well for other ROS.   Medical History: Past Medical History:  Diagnosis Date   Anemia    Anxiety    Arthritis    Asthma, unspecified asthma severity, unspecified whether complicated, unspecified whether persistent     There is no problem list on file for this patient.   Past Surgical History:  Procedure Laterality Date   axillary surgery       Allergies  Allergen Reactions   Other Other (See Comments)    All Antibiotics cause severe vaginal yeast infections   Penicillins Hives,  Itching, Rash and Swelling    Has patient had a PCN reaction causing immediate rash, facial/tongue/throat swelling, SOB or lightheadedness with hypotension: Yes  Has patient had a PCN reaction causing severe rash involving mucus membranes or skin necrosis: Yes  Has patient had a PCN reaction that required hospitalization Yes  Has patient had a PCN reaction occurring within the last 10 years: Yes  If all of the above answers are "NO", then may proceed with Cephalosporin use.  Has patient had a PCN reaction causing immediate rash, facial/tongue/throat swelling, SOB or lightheadedness with hypotension: Yes  Has patient had a PCN reaction causing severe rash involving mucus membranes or skin necrosis: Yes  Has patient had a PCN reaction that required hospitalization Yes  Has patient had a PCN reaction occurring within the last 10 years: Yes  If all of the above answers are "NO", then may proceed with Cephalosporin use.  Has patient had a PCN reaction causing immediate rash, facial/tongue/throat swelling, SOB or lightheadedness with hypotension: Yes  Has patient had a PCN reaction causing severe rash involving mucus membranes or skin necrosis: Yes  Has patient had a PCN reaction that required hospitalization Yes  Has patient had a PCN reaction occurring within the last 10 years: Yes  If all of the above answers are "NO", then may proceed with Cephalosporin use.    Has patient had a PCN reaction causing immediate rash, facial/tongue/throat swelling, SOB or lightheadedness with hypotension: Yes  Has patient had a PCN reaction causing  severe rash involving mucus membranes or skin necrosis: Yes  Has patient had a PCN reaction that required hospitalization Yes  Has patient had a PCN reaction occurring within the last 10 years: Yes  If all of the above answers are "NO", then may proceed with Cephalosporin use.  Has patient had a PCN reaction causing immediate rash, facial/tongue/throat swelling, SOB or  lightheadedness with hypotension: Yes  Has patient had a PCN reaction causing severe rash involving mucus membranes or skin necrosis: Yes  Has patient had a PCN reaction that required hospitalization Yes  Has patient had a PCN reaction occurring within the last 10 years: Yes  If all of the above answers are "NO", then may proceed with Cephalosporin use.   Shellfish Containing Products Hives   Sulfa (Sulfonamide Antibiotics) Rash    Current Outpatient Medications on File Prior to Visit  Medication Sig Dispense Refill   colchicine (COLCRYS) 0.6 mg tablet Take 1 tablet by mouth once daily     ergocalciferol, vitamin D2, 1,250 mcg (50,000 unit) capsule Take 1 capsule by mouth every 7 (seven) days     ibuprofen (MOTRIN) 800 MG tablet TAKE 1 TABLET BY MOUTH EVERY 8 HOURS AS NEEDED FOR MODERATE PAIN. EAT BEFORE TAKING MEDICATION     traZODone (DESYREL) 50 MG tablet TAKE 1 TO 2 TABLETS BY MOUTH DAILY AT BEDTIME AS NEEDED FOR SLEEP     acetaminophen-codeine (TYLENOL #4) 300-60 mg per tablet TAKE 1 TABLET BY MOUTH EVERY 4 HOURS FOR 10 DAYS AS NEEDED (Patient not taking: Reported on 05/21/2022)     clindamycin (CLEOCIN) 150 MG capsule  (Patient not taking: Reported on 05/21/2022)     Febuxostat (ULORIC) 40 mg tablet  (Patient not taking: Reported on 05/21/2022)     No current facility-administered medications on file prior to visit.    Family History  Problem Relation Age of Onset   Stroke Mother    Colon cancer Mother    Breast cancer Mother    High blood pressure (Hypertension) Father    Hyperlipidemia (Elevated cholesterol) Father      Social History   Tobacco Use  Smoking Status Former   Types: Cigarettes  Smokeless Tobacco Never     Social History   Socioeconomic History   Marital status: Single  Tobacco Use   Smoking status: Former    Types: Cigarettes   Smokeless tobacco: Never  Vaping Use   Vaping Use: Former  Substance and Sexual Activity   Alcohol use: Never   Drug use:  Yes    Objective:    Vitals:   05/21/22 1023  Pulse: 80  Temp: 36.8 C (98.2 F)  Weight: (!) 130.6 kg (288 lb)  Height: 180.3 cm ('5\' 11"'$ )     Body mass index is 40.17 kg/m.  Alert well-appearing Unlabored respirations There is an area of fluctuance and tenderness 3 x 2 cm with a 1 cm chronic granulation tissue bud at the superior aspect of the right side of the natal cleft.  No obvious midline pit, but there is desquamation/excoriation of the natal cleft inferior to this.  There is a healed scar without underlying tenderness or palpable cyst to the left of the midline about 5 cm below the top of the natal cleft.  Assessment and Plan:  Diagnoses and all orders for this visit:  Pilonidal abscess  History is consistent, with intermittent pain and drainage.  I recommend proceeding with trephination and we discussed the procedure in detail as well as postoperative  recovery, timeline, and wound care needs.  We discussed risks of bleeding, infection, pain, scarring, recurrence of disease.  Questions welcomed and answered to her satisfaction.  She would like to proceed with scheduling.   Sheryl Towell Raquel James, MD

## 2022-06-02 ENCOUNTER — Encounter (HOSPITAL_BASED_OUTPATIENT_CLINIC_OR_DEPARTMENT_OTHER): Payer: Self-pay | Admitting: Surgery

## 2022-06-02 NOTE — Progress Notes (Signed)
Spoke w/ via phone for pre-op interview--- pt Lab needs dos----  no             Lab results------ current EKG in epic/ chart COVID test -----patient states asymptomatic no test needed Arrive at ------- 0800 on 06-05-2022 NPO after MN NO Solid Food.  Clear liquids from MN until--- 0700 Med rec completed Medications to take morning of surgery ----- metoprolol Diabetic medication ----- n/a Patient instructed no nail polish to be worn day of surgery Patient instructed to bring photo id and insurance card day of surgery Patient aware to have Driver (ride ) / caregiver    for 24 hours after surgery -- father, Amanda Davenport Patient Special Instructions ----- asked to bring rescue inhaler Pre-Op special Istructions ----- n/a Patient verbalized understanding of instructions that were given at this phone interview. Patient denies shortness of breath, chest pain, fever, cough at this phone interview.    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 Lovena [facility] 04-07-2022) Rheumatology:  Dr Alfonso Patten. Szer (lov 04-10-2022) Chest x-ray : 11-30-2021 EKG : 11-30-2021 Echo : no Stress test: no Cardiac Cath : no Activity level:  denies sob w/ any activity Sleep Study/ CPAP : no Blood Thinner/ Instructions /Last Dose: no ASA / Instructions/ Last Dose : no

## 2022-06-05 ENCOUNTER — Other Ambulatory Visit: Payer: Self-pay

## 2022-06-05 ENCOUNTER — Ambulatory Visit (HOSPITAL_BASED_OUTPATIENT_CLINIC_OR_DEPARTMENT_OTHER): Payer: Medicare HMO | Admitting: Anesthesiology

## 2022-06-05 ENCOUNTER — Ambulatory Visit (HOSPITAL_BASED_OUTPATIENT_CLINIC_OR_DEPARTMENT_OTHER)
Admission: RE | Admit: 2022-06-05 | Discharge: 2022-06-05 | Disposition: A | Discharge status: Home / Self Care | Payer: Medicare HMO | Attending: Surgery | Admitting: Surgery

## 2022-06-05 ENCOUNTER — Encounter (HOSPITAL_BASED_OUTPATIENT_CLINIC_OR_DEPARTMENT_OTHER)
Admission: RE | Disposition: A | Discharge status: Home / Self Care | Payer: Self-pay | Source: Home / Self Care | Attending: Surgery

## 2022-06-05 ENCOUNTER — Encounter (HOSPITAL_BASED_OUTPATIENT_CLINIC_OR_DEPARTMENT_OTHER): Payer: Self-pay | Admitting: Surgery

## 2022-06-05 DIAGNOSIS — I456 Pre-excitation syndrome: Secondary | ICD-10-CM | POA: Insufficient documentation

## 2022-06-05 DIAGNOSIS — L0591 Pilonidal cyst without abscess: Secondary | ICD-10-CM | POA: Diagnosis not present

## 2022-06-05 DIAGNOSIS — Z79899 Other long term (current) drug therapy: Secondary | ICD-10-CM | POA: Diagnosis not present

## 2022-06-05 DIAGNOSIS — Z6841 Body Mass Index (BMI) 40.0 and over, adult: Secondary | ICD-10-CM | POA: Diagnosis not present

## 2022-06-05 DIAGNOSIS — Z09 Encounter for follow-up examination after completed treatment for conditions other than malignant neoplasm: Secondary | ICD-10-CM | POA: Insufficient documentation

## 2022-06-05 DIAGNOSIS — Z01818 Encounter for other preprocedural examination: Secondary | ICD-10-CM

## 2022-06-05 DIAGNOSIS — F419 Anxiety disorder, unspecified: Secondary | ICD-10-CM | POA: Diagnosis not present

## 2022-06-05 DIAGNOSIS — Z87891 Personal history of nicotine dependence: Secondary | ICD-10-CM | POA: Diagnosis not present

## 2022-06-05 DIAGNOSIS — J45909 Unspecified asthma, uncomplicated: Secondary | ICD-10-CM | POA: Insufficient documentation

## 2022-06-05 DIAGNOSIS — L0501 Pilonidal cyst with abscess: Secondary | ICD-10-CM | POA: Diagnosis not present

## 2022-06-05 HISTORY — PX: PILONIDAL CYST EXCISION: SHX744

## 2022-06-05 HISTORY — DX: Unspecified osteoarthritis, unspecified site: M19.90

## 2022-06-05 HISTORY — DX: Idiopathic chronic gout, multiple sites, without tophus (tophi): M1A.09X0

## 2022-06-05 HISTORY — DX: Other thrombocytosis: D75.838

## 2022-06-05 HISTORY — DX: Pilonidal cyst without abscess: L05.91

## 2022-06-05 HISTORY — DX: Unspecified asthma, uncomplicated: J45.909

## 2022-06-05 HISTORY — DX: Personal history of diseases of the skin and subcutaneous tissue: Z87.2

## 2022-06-05 HISTORY — DX: Hidradenitis suppurativa: L73.2

## 2022-06-05 HISTORY — DX: Deficiency of other specified B group vitamins: E53.8

## 2022-06-05 HISTORY — DX: Iron deficiency anemia, unspecified: D50.9

## 2022-06-05 HISTORY — DX: Presence of spectacles and contact lenses: Z97.3

## 2022-06-05 HISTORY — DX: Herpesviral infection, unspecified: B00.9

## 2022-06-05 LAB — POCT PREGNANCY, URINE: Preg Test, Ur: NEGATIVE

## 2022-06-05 SURGERY — EXCISION, PILONIDAL CYST, EXTENSIVE
Anesthesia: General | Site: Rectum

## 2022-06-05 MED ORDER — ACETAMINOPHEN 160 MG/5ML PO SOLN: 325.0000 mg | ORAL | Status: DC | PRN

## 2022-06-05 MED ORDER — FENTANYL CITRATE (PF) 100 MCG/2ML IJ SOLN
INTRAMUSCULAR | Status: AC
  Filled 2022-06-05: qty 2

## 2022-06-05 MED ORDER — LIDOCAINE HCL (CARDIAC) PF 100 MG/5ML IV SOSY
PREFILLED_SYRINGE | INTRAVENOUS | Status: DC | PRN
  Administered 2022-06-05: 100 mg via INTRAVENOUS

## 2022-06-05 MED ORDER — FENTANYL CITRATE (PF) 100 MCG/2ML IJ SOLN: 25.0000 ug | INTRAMUSCULAR | Status: DC | PRN

## 2022-06-05 MED ORDER — ACETAMINOPHEN 500 MG PO TABS
ORAL_TABLET | ORAL | Status: AC
  Filled 2022-06-05: qty 2

## 2022-06-05 MED ORDER — GABAPENTIN 300 MG PO CAPS: 300.0000 mg | ORAL_CAPSULE | ORAL | Status: DC

## 2022-06-05 MED ORDER — MEPERIDINE HCL 25 MG/ML IJ SOLN: 6.2500 mg | INTRAMUSCULAR | Status: DC | PRN

## 2022-06-05 MED ORDER — ROCURONIUM BROMIDE 100 MG/10ML IV SOLN
INTRAVENOUS | Status: DC | PRN
  Administered 2022-06-05: 70 mg via INTRAVENOUS

## 2022-06-05 MED ORDER — PROPOFOL 10 MG/ML IV BOLUS
INTRAVENOUS | Status: AC
  Filled 2022-06-05: qty 20

## 2022-06-05 MED ORDER — OXYCODONE HCL 5 MG PO TABS: 5.0000 mg | ORAL_TABLET | Freq: Once | ORAL | Status: DC | PRN

## 2022-06-05 MED ORDER — BUPIVACAINE LIPOSOME 1.3 % IJ SUSP: 20.0000 mL | Freq: Once | INTRAMUSCULAR | Status: DC

## 2022-06-05 MED ORDER — VANCOMYCIN HCL 1500 MG/300ML IV SOLN
1500.0000 mg | INTRAVENOUS | Status: AC
  Administered 2022-06-05: 100 mg via INTRAVENOUS
  Administered 2022-06-05: 1500 mg via INTRAVENOUS
  Filled 2022-06-05: qty 300

## 2022-06-05 MED ORDER — FENTANYL CITRATE (PF) 100 MCG/2ML IJ SOLN
INTRAMUSCULAR | Status: DC | PRN
  Administered 2022-06-05 (×4): 50 ug via INTRAVENOUS

## 2022-06-05 MED ORDER — MIDAZOLAM HCL 2 MG/2ML IJ SOLN
INTRAMUSCULAR | Status: AC
  Filled 2022-06-05: qty 2

## 2022-06-05 MED ORDER — HYDROGEN PEROXIDE 3 % EX SOLN
CUTANEOUS | Status: DC | PRN
  Administered 2022-06-05: 1

## 2022-06-05 MED ORDER — LACTATED RINGERS IV SOLN: INTRAVENOUS | Status: DC

## 2022-06-05 MED ORDER — GABAPENTIN 300 MG PO CAPS
ORAL_CAPSULE | ORAL | Status: AC
  Filled 2022-06-05: qty 1

## 2022-06-05 MED ORDER — ROCURONIUM BROMIDE 10 MG/ML (PF) SYRINGE
PREFILLED_SYRINGE | INTRAVENOUS | Status: AC
  Filled 2022-06-05: qty 10

## 2022-06-05 MED ORDER — ONDANSETRON HCL 4 MG/2ML IJ SOLN: 4.0000 mg | Freq: Once | INTRAMUSCULAR | Status: DC | PRN

## 2022-06-05 MED ORDER — MIDAZOLAM HCL 5 MG/5ML IJ SOLN
INTRAMUSCULAR | Status: DC | PRN
  Administered 2022-06-05: 2 mg via INTRAVENOUS

## 2022-06-05 MED ORDER — 0.9 % SODIUM CHLORIDE (POUR BTL) OPTIME
TOPICAL | Status: DC | PRN
  Administered 2022-06-05: 500 mL

## 2022-06-05 MED ORDER — ACETAMINOPHEN 500 MG PO TABS
1000.0000 mg | ORAL_TABLET | ORAL | Status: AC
  Administered 2022-06-05: 1000 mg via ORAL

## 2022-06-05 MED ORDER — ACETAMINOPHEN 325 MG PO TABS: 325.0000 mg | ORAL_TABLET | ORAL | Status: DC | PRN

## 2022-06-05 MED ORDER — OXYCODONE HCL 5 MG/5ML PO SOLN: 5.0000 mg | Freq: Once | ORAL | Status: DC | PRN

## 2022-06-05 MED ORDER — ONDANSETRON HCL 4 MG/2ML IJ SOLN
INTRAMUSCULAR | Status: AC
  Filled 2022-06-05: qty 2

## 2022-06-05 MED ORDER — CHLORHEXIDINE GLUCONATE 4 % EX LIQD: 60.0000 mL | Freq: Once | CUTANEOUS | Status: DC

## 2022-06-05 MED ORDER — KETOROLAC TROMETHAMINE 30 MG/ML IJ SOLN
INTRAMUSCULAR | Status: DC | PRN
  Administered 2022-06-05: 30 mg via INTRAVENOUS

## 2022-06-05 MED ORDER — SUGAMMADEX SODIUM 200 MG/2ML IV SOLN
INTRAVENOUS | Status: DC | PRN
  Administered 2022-06-05: 200 mg via INTRAVENOUS

## 2022-06-05 MED ORDER — PROPOFOL 10 MG/ML IV BOLUS
INTRAVENOUS | Status: DC | PRN
  Administered 2022-06-05: 200 mg via INTRAVENOUS

## 2022-06-05 MED ORDER — DEXAMETHASONE SODIUM PHOSPHATE 10 MG/ML IJ SOLN
INTRAMUSCULAR | Status: AC
  Filled 2022-06-05: qty 1

## 2022-06-05 MED ORDER — BUPIVACAINE LIPOSOME 1.3 % IJ SUSP
INTRAMUSCULAR | Status: DC | PRN
  Administered 2022-06-05: 50 mL

## 2022-06-05 MED ORDER — ONDANSETRON HCL 4 MG/2ML IJ SOLN
INTRAMUSCULAR | Status: DC | PRN
  Administered 2022-06-05: 4 mg via INTRAVENOUS

## 2022-06-05 MED ORDER — DEXAMETHASONE SODIUM PHOSPHATE 4 MG/ML IJ SOLN
INTRAMUSCULAR | Status: DC | PRN
  Administered 2022-06-05: 5 mg via INTRAVENOUS

## 2022-06-05 SURGICAL SUPPLY — 41 items
APL SKNCLS STERI-STRIP NONHPOA (GAUZE/BANDAGES/DRESSINGS) ×1
BENZOIN TINCTURE PRP APPL 2/3 (GAUZE/BANDAGES/DRESSINGS) ×1 IMPLANT
BLADE EXTENDED COATED 6.5IN (ELECTRODE) IMPLANT
BLADE SURG 10 STRL SS (BLADE) IMPLANT
BLADE SURG 15 STRL LF DISP TIS (BLADE) ×1 IMPLANT
BLADE SURG 15 STRL SS (BLADE) ×1
BRIEF MESH DISP LRG (UNDERPADS AND DIAPERS) IMPLANT
COVER BACK TABLE 60X90IN (DRAPES) ×1 IMPLANT
COVER MAYO STAND STRL (DRAPES) ×1 IMPLANT
DRAIN PENROSE 0.25X18 (DRAIN) IMPLANT
DRAPE LAPAROTOMY 100X72 PEDS (DRAPES) ×1 IMPLANT
DRAPE UTILITY XL STRL (DRAPES) ×1 IMPLANT
ELECT REM PT RETURN 9FT ADLT (ELECTROSURGICAL) ×1
ELECTRODE REM PT RTRN 9FT ADLT (ELECTROSURGICAL) ×1 IMPLANT
GAUZE 4X4 16PLY ~~LOC~~+RFID DBL (SPONGE) ×1 IMPLANT
GAUZE PAD ABD 8X10 STRL (GAUZE/BANDAGES/DRESSINGS) ×1 IMPLANT
GAUZE SPONGE 4X4 12PLY STRL (GAUZE/BANDAGES/DRESSINGS) ×1 IMPLANT
GLOVE BIO SURGEON STRL SZ 6 (GLOVE) ×1 IMPLANT
GLOVE INDICATOR 6.5 STRL GRN (GLOVE) ×1 IMPLANT
GOWN STRL REUS W/TWL LRG LVL3 (GOWN DISPOSABLE) ×1 IMPLANT
HYDROGEN PEROXIDE 16OZ (MISCELLANEOUS) ×1 IMPLANT
IV CATH 14GX2 1/4 (CATHETERS) IMPLANT
KIT TURNOVER CYSTO (KITS) ×1 IMPLANT
NDL HYPO 25X1 1.5 SAFETY (NEEDLE) ×1 IMPLANT
NDL SAFETY ECLIP 18X1.5 (MISCELLANEOUS) IMPLANT
NEEDLE HYPO 25X1 1.5 SAFETY (NEEDLE) ×1 IMPLANT
NS IRRIG 500ML POUR BTL (IV SOLUTION) ×1 IMPLANT
PACK BASIN DAY SURGERY FS (CUSTOM PROCEDURE TRAY) ×1 IMPLANT
PAD ARMBOARD 7.5X6 YLW CONV (MISCELLANEOUS) IMPLANT
PENCIL SMOKE EVACUATOR (MISCELLANEOUS) ×1 IMPLANT
PUNCH BIOPSY DERMAL 3 (INSTRUMENTS) IMPLANT
PUNCH BIOPSY DERMAL 3MM (INSTRUMENTS)
PUNCH BIOPSY DERMAL 4MM (INSTRUMENTS) IMPLANT
SLEEVE SCD COMPRESS KNEE MED (STOCKING) ×1 IMPLANT
SUT CHROMIC 3 0 SH 27 (SUTURE) IMPLANT
SYR 30ML LL (SYRINGE) ×1 IMPLANT
SYR CONTROL 10ML LL (SYRINGE) ×2 IMPLANT
TOWEL OR 17X24 6PK STRL BLUE (TOWEL DISPOSABLE) ×2 IMPLANT
TRAY DSU PREP LF (CUSTOM PROCEDURE TRAY) ×1 IMPLANT
TUBE CONNECTING 12X1/4 (SUCTIONS) ×1 IMPLANT
YANKAUER SUCT BULB TIP NO VENT (SUCTIONS) ×1 IMPLANT

## 2022-06-05 NOTE — Anesthesia Procedure Notes (Signed)
Procedure Name: Intubation Date/Time: 06/05/2022 9:37 AM  Performed by: Justice Rocher, CRNAPre-anesthesia Checklist: Patient identified, Emergency Drugs available, Suction available, Patient being monitored and Timeout performed Patient Re-evaluated:Patient Re-evaluated prior to induction Oxygen Delivery Method: Circle system utilized Preoxygenation: Pre-oxygenation with 100% oxygen Induction Type: IV induction Ventilation: Mask ventilation without difficulty Laryngoscope Size: Mac and 4 Grade View: Grade II Tube type: Oral Tube size: 7.0 mm Number of attempts: 1 Airway Equipment and Method: Stylet and Oral airway Placement Confirmation: ETT inserted through vocal cords under direct vision, positive ETCO2, breath sounds checked- equal and bilateral and CO2 detector Secured at: 23 cm Tube secured with: Tape Dental Injury: Teeth and Oropharynx as per pre-operative assessment

## 2022-06-05 NOTE — Progress Notes (Signed)
This RN forgot to get pt to sign belongings sheet.  Pt received cell phone, wallet and glasses and stated that this was all the belongings she gave to staff

## 2022-06-05 NOTE — Discharge Instructions (Addendum)
Postoperative Instructions Surgery for Pilonidal Disease Restrictions  You may shower after 24 hours but you should avoid soaking in water for more than ten minutes.  There are no dietary restrictions.  You should avoid alcohol while you are on narcotic pain medication.  Activity can be as tolerated.  Wound care - Your incision was intentionally left open. You should cover the incision with gauze, and secure with tape. This should be changed at least daily or whenever the dressing gets wet. The wound will heal over the next few weeks.  - Hair removal using clippers, waxing or depilatory creams around the area will help reduce the chance of recurrence. This can be initiated once the wound starts to heal.    Medications  You have a pain medication prescription from your usual provider. In addition to the prescription pain medicine that you usually take, you may take Motrin, Advil, or ibuprofen at the same time. This often gives better pain relief than either one by itself.  Stop the prescription and switch to Motrin, Advil, or ibuprofen as soon as these medications can control your pain. (This will reduce your risk of constipation.) -Most patients will experience some swelling and bruising around the incisions.  Ice packs or heating pads (30-60 minutes up to 6 times a day) will help. Use ice for the first few days to help decrease swelling and bruising, then switch to heat to help relax tight/sore spots and speed recovery.  Some people prefer to use ice alone, heat alone, alternating between ice & heat.  Experiment to what works for you.  Swelling and bruising can take several weeks to resolve.  Take Colace 100 mg two times daily until you are having regular bowel movements.  Call the office if:  The pain worsens and you need to increase the amount of pain medication.  You experience persistent fever or chills. (You may have lowgrade fevers on and off for the days following surgery? this  is your body's normal reaction to surgery.)  There is redness of more than  inch around the incisions.  There is drainage of cloudy fluid or pus (Drainage of a yellowish bloody fluid is normal.)  You experience persistent nausea or vomiting.   Please call the office [phone]) to make a followup appointment for one to two weeks after surgery and if you have any other problems, questions, or concerns.   The clinic staff is available to answer your questions during regular business hours (8:30am-5pm).  Please don't hesitate to call and ask to speak to one of our nurses for clinical concerns.              If you have disability or family leave forms, bring them to the office for processing. Do not give them to your doctor.  If you have a medical emergency, go to the nearest emergency room or call 911.  A surgeon from [facility] is always on call at the [facility], [place], [place], [place], [place]  [id number] ? MAIN: [phone] ? TOLL FREE: [phone] ?  FAX [phone] [email or web address]            No acetaminophen/Tylenol until after 2:00 pm today if needed.  No ibuprofen, Advil, Aleve, Motrin, ketorolac, meloxicam, naproxen, or other NSAIDS until after 3:30 pm today if needed.   Post Anesthesia Home Care Instructions  Activity: Get plenty of rest for the remainder of the day. A responsible  individual must stay with you for 24 hours following the procedure.  For the next 24 hours, DO NOT: -Drive a car -Paediatric nurse -Drink alcoholic beverages -Take any medication unless instructed by your physician -Make any legal decisions or sign important papers.  Meals: Start with liquid foods such as gelatin or soup. Progress to regular foods as tolerated. Avoid greasy, spicy, heavy foods. If nausea and/or vomiting occur, drink only clear liquids until the nausea and/or vomiting subsides. Call  your physician if vomiting continues.  Special Instructions/Symptoms: Your throat may feel dry or sore from the anesthesia or the breathing tube placed in your throat during surgery. If this causes discomfort, gargle with warm salt water. The discomfort should disappear within 24 hours.      Information for Discharge Teaching: EXPAREL (bupivacaine liposome injectable suspension)   Your surgeon or anesthesiologist gave you EXPAREL(bupivacaine) to help control your pain after surgery.  EXPAREL is a local anesthetic that provides pain relief by numbing the tissue around the surgical site. EXPAREL is designed to release pain medication over time and can control pain for up to 72 hours. Depending on how you respond to EXPAREL, you may require less pain medication during your recovery.  Possible side effects: Temporary loss of sensation or ability to move in the area where bupivacaine was injected. Nausea, vomiting, constipation Rarely, numbness and tingling in your mouth or lips, lightheadedness, or anxiety may occur. Call your doctor right away if you think you may be experiencing any of these sensations, or if you have other questions regarding possible side effects.  Follow all other discharge instructions given to you by your surgeon or nurse. Eat a healthy diet and drink plenty of water or other fluids.  If you return to the hospital for any reason within 96 hours following the administration of EXPAREL, it is important for health care providers to know that you have received this anesthetic. A teal colored band has been placed on your arm with the date, time and amount of EXPAREL you have received in order to alert and inform your health care providers. Please leave this armband in place for the full 96 hours following administration, and then you may remove the band.

## 2022-06-05 NOTE — Interval H&P Note (Signed)
History and Physical Interval Note:  06/05/2022 8:54 AM  Amanda Davenport  has presented today for surgery, with the diagnosis of pilonidal cyst.  The various methods of treatment have been discussed with the patient and family. After consideration of risks, benefits and other options for treatment, the patient has consented to  Procedure(s): TREPHINATION OF PILONIDAL CYST (N/A) as a surgical intervention.  The patient's history has been reviewed, patient examined, no change in status, stable for surgery.  I have reviewed the patient's chart and labs.  Questions were answered to the patient's satisfaction.     Kalyn Hofstra Rich Brave

## 2022-06-05 NOTE — Op Note (Signed)
Operative Note  Amanda Davenport  [id number]  [id number]  06/05/2022   Surgeon: Romana Juniper MD FACS   Procedure performed: Trephination of pilonidal cyst   Preop diagnosis: Pilonidal cyst Post-op diagnosis/intraop findings: Same   Specimens: No Retained items: No EBL: Minimal cc Complications: none   Description of procedure: After obtaining informed consent the patient was taken to the operating room and placed supine on operating room table where general endotracheal anesthesia was initiated, preoperative antibiotics were administered, SCDs applied, and a formal timeout was performed.  The patient was then repositioned prone with all pressure points appropriately padded.  The buttocks were gently taped apart and then the natal cleft region was prepped and draped in usual sterile fashion.  A field block with Exparel mixed with quarter percent Marcaine was performed.  On inspection, there is a chronic but of granulation tissue at the superior aspect of the right side of the natal cleft.  About 2 cm cephalad to this within the midline there are 2 pits.  There is excoriation of the natal cleft inferior to this but no additional pits.  The skin and superficial cyst wall were excised with cautery, creating an approximately 1 cm wound.  Purulent drainage and chronic granulation tissue were identified at the medial aspect of this. A 70m  punch was then used to excise the 2 midline pits leaving a small skin bridge between them.  The tract was then gently probed with a lacrimal duct probe and both midline pits were noted to communicate with the main cyst on the right upper buttock.  The tract was debrided with a curette followed by a Ray-Tec and then this was flushed with hydrogen peroxide followed by saline.  The cyst wall was then probed and noted to track slightly superiorly.  The cavity was again debrided with a curette and Ray-Tec and then flushed aggressively to ensure no retained foreign material  and significant debridement of chronic granulation tissue.  Hemostasis was ensured within the wound.  The wounds were then gently packed with a 4 x 4 and covered with a dry gauze dressing.  The patient was then returned to the supine position, awakened, extubated and taken to PACU in stable condition.    All counts were correct at the completion of the case.

## 2022-06-05 NOTE — Transfer of Care (Signed)
Immediate Anesthesia Transfer of Care Note  Patient: Amanda Davenport  Procedure(s) Performed: Procedure(s) (LRB): TREPHINATION OF PILONIDAL CYST (N/A)  Patient Location: PACU  Anesthesia Type: General  Level of Consciousness: awake, sedated, patient cooperative and responds to stimulation  Airway & Oxygen Therapy: Patient Spontanous Breathing and Patient connected to [place] oxygen  Post-op Assessment: Report given to PACU RN, Post -op Vital signs reviewed and stable and Patient moving all extremities  Post vital signs: Reviewed and stable  Complications: No apparent anesthesia complications

## 2022-06-05 NOTE — Anesthesia Preprocedure Evaluation (Addendum)
Anesthesia Evaluation  Patient identified by MRN, date of birth, ID band Patient awake    Reviewed: Allergy & Precautions, NPO status , Patient's Chart, lab work & pertinent test results  Airway Mallampati: II  TM Distance: >3 FB Neck ROM: Full    Dental  (+) Missing, Dental Advisory Given,    Pulmonary asthma , former smoker   Pulmonary exam normal breath sounds clear to auscultation       Cardiovascular Normal cardiovascular exam+ dysrhythmias (WPW, on metoprolol)  Rhythm:Regular Rate:Normal     Neuro/Psych  Headaches PSYCHIATRIC DISORDERS Anxiety Depression     Neuromuscular disease    GI/Hepatic negative GI ROS,,,(+)     substance abuse  marijuana use  Endo/Other    Morbid obesity  Renal/GU negative Renal ROS  negative genitourinary   Musculoskeletal negative musculoskeletal ROS (+) Arthritis ,    Abdominal   Peds  Hematology  (+) anemia   Anesthesia Other Findings   Reproductive/Obstetrics                             Anesthesia Physical Anesthesia Plan  ASA: 3  Anesthesia Plan: General   Post-op Pain Management:    Induction: Intravenous  PONV Risk Score and Plan: 3 and Dexamethasone and Ondansetron  Airway Management Planned: Oral ETT  Additional Equipment: None  Intra-op Plan:   Post-operative Plan: Extubation in OR  Informed Consent: I have reviewed the patients History and Physical, chart, labs and discussed the procedure including the risks, benefits and alternatives for the proposed anesthesia with the patient or authorized representative who has indicated his/her understanding and acceptance.     Dental advisory given  Plan Discussed with: CRNA and Anesthesiologist  Anesthesia Plan Comments:         Anesthesia Quick Evaluation

## 2022-06-07 NOTE — Anesthesia Postprocedure Evaluation (Signed)
Anesthesia Post Note  Patient: Amanda Davenport  Procedure(s) Performed: TREPHINATION OF PILONIDAL CYST (Rectum)     Patient location during evaluation: PACU Anesthesia Type: General Level of consciousness: awake and alert Pain management: pain level controlled Vital Signs Assessment: post-procedure vital signs reviewed and stable Respiratory status: spontaneous breathing, nonlabored ventilation, respiratory function stable and patient connected to nasal cannula oxygen Cardiovascular status: blood pressure returned to baseline and stable Postop Assessment: no apparent nausea or vomiting Anesthetic complications: no   No notable events documented.  Last Vitals:  Vitals:   06/05/22 1115 06/05/22 1212  BP: 94/81 106/76  Pulse: 86 72  Resp: 20 16  Temp:  37 C  SpO2: 97% 97%    Last Pain:  Vitals:   06/05/22 1212  TempSrc: Oral  PainSc: 0-No pain                 Albirta Rhinehart

## 2022-06-08 ENCOUNTER — Encounter (HOSPITAL_BASED_OUTPATIENT_CLINIC_OR_DEPARTMENT_OTHER): Payer: Self-pay | Admitting: Surgery

## 2022-10-01 ENCOUNTER — Telehealth: Payer: Self-pay | Admitting: Hematology

## 2022-11-04 ENCOUNTER — Ambulatory Visit: Payer: Medicare HMO | Admitting: Hematology

## 2022-11-04 ENCOUNTER — Other Ambulatory Visit: Payer: Medicare HMO

## 2022-12-01 ENCOUNTER — Other Ambulatory Visit: Payer: Self-pay

## 2022-12-01 DIAGNOSIS — D509 Iron deficiency anemia, unspecified: Secondary | ICD-10-CM

## 2022-12-02 ENCOUNTER — Inpatient Hospital Stay (HOSPITAL_BASED_OUTPATIENT_CLINIC_OR_DEPARTMENT_OTHER): Payer: Medicare HMO | Admitting: Hematology

## 2022-12-02 ENCOUNTER — Inpatient Hospital Stay: Payer: Medicare HMO | Attending: Hematology

## 2022-12-02 VITALS — BP 115/80 | HR 105 | Temp 98.3°F | Resp 20 | Wt 290.5 lb

## 2022-12-02 DIAGNOSIS — Z803 Family history of malignant neoplasm of breast: Secondary | ICD-10-CM | POA: Diagnosis not present

## 2022-12-02 DIAGNOSIS — R5383 Other fatigue: Secondary | ICD-10-CM | POA: Insufficient documentation

## 2022-12-02 DIAGNOSIS — Z87891 Personal history of nicotine dependence: Secondary | ICD-10-CM | POA: Insufficient documentation

## 2022-12-02 DIAGNOSIS — N920 Excessive and frequent menstruation with regular cycle: Secondary | ICD-10-CM | POA: Insufficient documentation

## 2022-12-02 DIAGNOSIS — L732 Hidradenitis suppurativa: Secondary | ICD-10-CM | POA: Diagnosis not present

## 2022-12-02 DIAGNOSIS — D509 Iron deficiency anemia, unspecified: Secondary | ICD-10-CM | POA: Insufficient documentation

## 2022-12-02 DIAGNOSIS — D75838 Other thrombocytosis: Secondary | ICD-10-CM | POA: Insufficient documentation

## 2022-12-02 DIAGNOSIS — E538 Deficiency of other specified B group vitamins: Secondary | ICD-10-CM | POA: Diagnosis not present

## 2022-12-02 LAB — CMP (CANCER CENTER ONLY)
ALT: 10 U/L (ref 0–44)
AST: 16 U/L (ref 15–41)
Albumin: 3.7 g/dL (ref 3.5–5.0)
Alkaline Phosphatase: 69 U/L (ref 38–126)
Anion gap: 9 (ref 5–15)
BUN: 8 mg/dL (ref 6–20)
CO2: 23 mmol/L (ref 22–32)
Calcium: 9 mg/dL (ref 8.9–10.3)
Chloride: 107 mmol/L (ref 98–111)
Creatinine: 0.73 mg/dL (ref 0.44–1.00)
GFR, Estimated: >60 mL/min (ref >60)
Glucose, Bld: 111 mg/dL — ABNORMAL HIGH (ref 70–99)
Potassium: 3.5 mmol/L (ref 3.5–5.1)
Sodium: 139 mmol/L (ref 135–145)
Total Bilirubin: 0.3 mg/dL (ref 0.3–1.2)
Total Protein: 9 g/dL — ABNORMAL HIGH (ref 6.5–8.1)

## 2022-12-02 LAB — CBC WITH DIFFERENTIAL (CANCER CENTER ONLY)
Abs Immature Granulocytes: 0.02 10*3/uL (ref 0.00–0.07)
Basophils Absolute: 0.1 10*3/uL (ref 0.0–0.1)
Basophils Relative: 1 %
Eosinophils Absolute: 0.1 10*3/uL (ref 0.0–0.5)
Eosinophils Relative: 2 %
HCT: 28.9 % — ABNORMAL LOW (ref 36.0–46.0)
Hemoglobin: 8.6 g/dL — ABNORMAL LOW (ref 12.0–15.0)
Immature Granulocytes: 0 %
Lymphocytes Relative: 54 %
Lymphs Abs: 3.9 10*3/uL (ref 0.7–4.0)
MCH: 21.4 pg — ABNORMAL LOW (ref 26.0–34.0)
MCHC: 29.8 g/dL — ABNORMAL LOW (ref 30.0–36.0)
MCV: 71.9 fL — ABNORMAL LOW (ref 80.0–100.0)
Monocytes Absolute: 0.3 10*3/uL (ref 0.1–1.0)
Monocytes Relative: 5 %
Neutro Abs: 2.7 10*3/uL (ref 1.7–7.7)
Neutrophils Relative %: 38 %
Platelet Count: 255 10*3/uL (ref 150–400)
RBC: 4.02 MIL/uL (ref 3.87–5.11)
RDW: 17.6 % — ABNORMAL HIGH (ref 11.5–15.5)
WBC Count: 7.1 10*3/uL (ref 4.0–10.5)
nRBC: 0 % (ref 0.0–0.2)

## 2022-12-02 LAB — FERRITIN: Ferritin: 4 ng/mL — ABNORMAL LOW (ref 11–307)

## 2022-12-02 NOTE — Progress Notes (Signed)
HEMATOLOGY/ONCOLOGY CLINIC NOTE  Date of Service: 12/02/22   PCP Hillery Aldo Rheumatology (Gout management) Sharl Ma MD Cardiology - [place] [facility]  CHIEF COMPLAINTS/PURPOSE OF CONSULTATION:  For continued management of iron deficiency anemia with reactive thrombocytosis  HISTORY OF PRESENTING ILLNESS:  Amanda Davenport is a wonderful 42 y.o. female who has been referred to [place] for evaluation and management of thrombocytosis.  She was last seen by [place] in 2016 and was lost to follow-up due to issues with her medical insurance.  Patient has been referred back to [place] for evaluation of her intermittent thrombocytosis and severe iron deficiency anemia. The pt reports her hidradenitis suppurativa has been quite bothersome and she had surgery on 4/23-- left HS excision--still has an open surgical wound with some oozing .  Had blood loss during surgery as well.  Patient notes she has had significant issues with severe gout attacks- prednisone, allopurinol/colchicine/febostat.  She is following with Dr.Khiem Adin Hector MD for her rheumatology cares.  She continues to have heavy menstrual periods lasting 5 days of which 4 are very heavy. Has been taking Geritol  1 month and also has continued taking prenatal vitamins No personal history of VTE No FHx of VTE, bleeding or clotting disorders but mother had PE in the setting of surgery and active breast cancer. Quit smoking tobacco- 07/2019. 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  On review of systems, pt reports severe fatigue and some ice cravings.  No fevers chills night sweats or new bone pains.  INTERVAL HISTORY:  Amanda Davenport is a 42 y.o. female here for  continued evaluation and management of her iron deficiency anemia. Patient was last seen by me on 04/29/2022 and reported a pollinated cyst and mild blood loss.  Today, she reports that she has not received IV iron since January. She reports that she has been taking Metformin for 2 months for weight-loss, which she feels has caused her menstrual cycle flow to become lighter. Her periods have been generally stable. She reports that she previously weighed 300 pounds and has lost 10 pounds so far.   She is currently taking Infliximab for her hidradenitis which improved. No obvious black stools or blood in stools. She has been tolerating Infliximab well with no infeciton issues. She reports low energy levels, fatigue and ice cravings recently.  MEDICAL HISTORY:  Past Medical History:  Diagnosis Date   Anxiety    Asthma    06-02-2022  per pt has had asthma since having covid in 2021,  last used rescue inhaler last week   Axillary hidradenitis suppurativa    dermatologist--- dr r. pichardo[facility]   B12 deficiency    Depression    History of local infection of skin and subcutaneous tissue    recurrent infected boils   History of sepsis 05/2021   admission in epic ;  secondary to cellulitis due to hydradenitis supprative right axilla   HSV-2 infection    IDA (iron deficiency anemia)    hemtologist--- dr Candise Che;  treated with infusions   Idiopathic chronic gout of multiple sites without tophus    followed by rheumonatologist-- dr Kirtland Bouchard.  tuong   Inflammatory arthritis    rheumonatologist---dr r. szer;  treated remicade   Neuropathy    Other thrombocytosis    followed by dr Candise Che;  intermittant   Pilonidal cyst    Wears glasses    WPW (Wolff-Parkinson-White syndrome) 08/2011   (06-02-2022  pt denies symptoms & never had syncope / near syncope) cardiologist--- dr g. taylor; (first dx ED 05/ 2013)  per lov in epic 04-07-2022  concealed wpw patterened    SURGICAL HISTORY: Past Surgical History:   Procedure Laterality Date   ADJACENT TISSUE TRANSFER/TISSUE REARRANGEMENT Left 07/28/2019   Procedure: ADJACENT TISSUE TRANSFER TO LEFT AXILLA GREATER THAN 100 CM SQUARED;  Surgeon: Glenna Fellows, MD;  Location: [facility];  Service: Plastics;  Laterality: Left;   COLONOSCOPY WITH PROPOFOL  01/04/2013   dr d. Juanda Chance   HYDRADENITIS EXCISION Left 07/28/2019   Procedure: EXCISION LEFT HIDRADENITIS AXILLA;  Surgeon: Berna Bue, MD;  Location: [facility];  Service: General;  Laterality: Left;   INCISION AND DRAINAGE PERITONSILLAR ABSCESS Left 12/27/2004   @[facility]  by dr Lazarus Salines   PILONIDAL CYST EXCISION N/A 06/05/2022   Procedure: TREPHINATION OF PILONIDAL CYST;  Surgeon: Berna Bue, MD;  Location: [facility] [place];  Service: General;  Laterality: N/A;    SOCIAL HISTORY: Social History   Socioeconomic History   Marital status: Single    Spouse name: Not on file   Number of children: 0   Years of education: Not on file   Highest education level: Not on file  Occupational History   Occupation: disabled  Tobacco Use   Smoking status: Former    Current packs/day: 0.00    Types: Cigarettes    Start date: 2012    Quit date: 2013    Years since quitting: 11.6   Smokeless tobacco: Never  Vaping Use   Vaping status: Never Used  Substance and Sexual Activity   Alcohol use: No   Drug use: Yes    Types: Marijuana    Comment: 06-02-2022  per pt smokes daily   Sexual activity: Yes    Partners: Male    Birth control/protection: Condom  Other Topics Concern   Not on file  Social History Narrative   Not on file   Social Determinants of Health   Financial Resource Strain: Low Risk  (12/28/2020)   Overall Financial Resource Strain (CARDIA)    Difficulty of Paying Living Expenses: Not hard at all  Food Insecurity: No Food Insecurity (12/28/2020)   Hunger Vital Sign    Worried About Running Out of Food in the Last Year: Never true    Ran Out of Food in the Last Year: Never  true  Transportation Needs: No Transportation Needs (12/28/2020)   PRAPARE - Administrator, Civil Service (Medical): No    Lack of Transportation (Non-Medical): No  Physical Activity: Inactive (12/28/2020)   Exercise Vital Sign    Days of Exercise per Week: 0 days    Minutes of Exercise per Session: 0 min  Stress: Stress Concern Present (12/28/2020)   [employer] of Occupational Health - Occupational Stress Questionnaire    Feeling of Stress : To some extent  Social Connections: Socially Isolated (12/28/2020)   Social Connection and Isolation Panel [NHANES]    Frequency of Communication with Friends and Family: More than three times a week    Frequency of Social Gatherings with Friends and Family: Never    Attends Religious Services: Never    Active  Member of Clubs or Organizations: No    Attends Banker Meetings: Never    Marital Status: Never married  Intimate Partner Violence: Not At Risk (12/28/2020)   Humiliation, Afraid, Rape, and Kick questionnaire    Fear of Current or Ex-Partner: No    Emotionally Abused: No    Physically Abused: No    Sexually Abused: No    FAMILY HISTORY: Family History  Problem Relation Age of Onset   Breast cancer Mother    Pulmonary embolism Mother        died of PE   Colon cancer Mother    Irritable bowel syndrome Mother    Cancer Mother    Hypertension Father    Diabetes Paternal Grandmother    Heart disease Neg Hx    Stroke Neg Hx     ALLERGIES:  is allergic to other, penicillins, shrimp [shellfish allergy], and sulfa antibiotics.  MEDICATIONS:  Current Outpatient Medications  Medication Sig Dispense Refill   albuterol (VENTOLIN HFA) 108 (90 Base) MCG/ACT inhaler Inhale 1-2 puffs into the lungs every 6 (six) hours as needed for wheezing or shortness of breath.     cyclobenzaprine (FLEXERIL) 5 MG tablet Take 1 tablet (5 mg total) by mouth 3 (three) times daily as needed for muscle spasm (Patient taking  differently: Take 5 mg by mouth 3 (three) times daily as needed for muscle spasms.) 45 tablet 0   ibuprofen (ADVIL) 800 MG tablet Take 800 mg by mouth 2 (two) times daily as needed for cramping.     inFLIXimab (REMICADE IV) Inject into the vein every 30 (thirty) days.     metoprolol succinate (TOPROL-XL) 25 MG 24 hr tablet Take 50 mg by mouth daily.     ondansetron (ZOFRAN-ODT) 4 MG disintegrating tablet Take 4 mg by mouth every 8 (eight) hours as needed for nausea.     oxyCODONE-acetaminophen (PERCOCET) 10-325 MG tablet Take 1 tablet by mouth 3 (three) times daily as needed for pain. (Patient taking differently: Take 1 tablet by mouth every 8 (eight) hours as needed.) 90 tablet 0   pregabalin (LYRICA) 50 MG capsule Take 50 mg by mouth 3 (three) times daily as needed.     traZODone (DESYREL) 50 MG tablet Take 50-100 mg by mouth at bedtime as needed for sleep.     No current facility-administered medications for this visit.   REVIEW OF SYSTEMS:  10 Point review of Systems was done is negative except as noted above.   PHYSICAL EXAMINATION: ECOG PERFORMANCE STATUS: 2 - Symptomatic, <50% confined to bed  . Vitals:   12/02/22 1058  BP: 115/80  Pulse: (!) 105  Resp: 20  Temp: 98.3 F (36.8 C)  SpO2: 100%   Filed Weights   12/02/22 1058  Weight: 290 lb 8 oz (131.8 kg)  .Body mass index is 41.68 kg/m. GENERAL:alert, in no acute distress and comfortable SKIN: no acute rashes, no significant lesions EYES: conjunctiva are pink and non-injected, sclera anicteric OROPHARYNX: MMM, no exudates, no oropharyngeal erythema or ulceration NECK: supple, no JVD LYMPH:  no palpable lymphadenopathy in the cervical, axillary or inguinal regions LUNGS: clear to auscultation b/l with normal respiratory effort HEART: regular rate & rhythm ABDOMEN:  normoactive bowel sounds , non tender, not distended. Extremity: no pedal edema PSYCH: alert & oriented x 3 with fluent speech NEURO: no focal  motor/sensory deficits   LABORATORY DATA:  I have reviewed the data as listed  .    Latest Ref Rng &  Units 12/02/2022   10:38 AM 04/29/2022   11:58 AM 11/30/2021    3:33 PM  CBC  WBC 4.0 - 10.5 K/uL 7.1  8.9  14.2   Hemoglobin 12.0 - 15.0 g/dL 8.6  16.1  9.0   Hematocrit 36.0 - 46.0 % 28.9  34.1  28.4   Platelets 150 - 400 K/uL 255  354  572    . CBC    Component Value Date/Time   WBC 7.1 12/02/2022 1038   WBC 14.2 (H) 11/30/2021 1533   RBC 4.02 12/02/2022 1038   HGB 8.6 (L) 12/02/2022 1038   HGB 8.7 (L) 01/11/2020 1015   HGB 9.5 (L) 12/18/2014 1518   HCT 28.9 (L) 12/02/2022 1038   HCT 31.9 (L) 01/11/2020 1015   HCT 31.4 (L) 12/18/2014 1518   PLT 255 12/02/2022 1038   PLT 639 (H) 01/11/2020 1015   MCV 71.9 (L) 12/02/2022 1038   MCV 71 (L) 01/11/2020 1015   MCV 67.0 (L) 12/18/2014 1518   MCH 21.4 (L) 12/02/2022 1038   MCHC 29.8 (L) 12/02/2022 1038   RDW 17.6 (H) 12/02/2022 1038   RDW 18.6 (H) 01/11/2020 1015   RDW 26.0 (H) 12/18/2014 1518   LYMPHSABS 3.9 12/02/2022 1038   LYMPHSABS 2.0 01/11/2020 1015   LYMPHSABS 1.6 12/18/2014 1518   MONOABS 0.3 12/02/2022 1038   MONOABS 0.3 12/18/2014 1518   EOSABS 0.1 12/02/2022 1038   EOSABS 0.2 01/11/2020 1015   BASOSABS 0.1 12/02/2022 1038   BASOSABS 0.1 01/11/2020 1015   BASOSABS 0.0 12/18/2014 1518    .    Latest Ref Rng & Units 12/02/2022   10:38 AM 04/29/2022   11:58 AM 11/30/2021    3:33 PM  CMP  Glucose 70 - 99 mg/dL 096  96  045   BUN 6 - 20 mg/dL 8  9  10    Creatinine 0.44 - 1.00 mg/dL 4.09  8.11  9.14   Sodium 135 - 145 mmol/L 139  136  141   Potassium 3.5 - 5.1 mmol/L 3.5  3.6  3.4   Chloride 98 - 111 mmol/L 107  107  112   CO2 22 - 32 mmol/L 23  24  20    Calcium 8.9 - 10.3 mg/dL 9.0  8.7  9.0   Total Protein 6.5 - 8.1 g/dL 9.0  8.5  8.5   Total Bilirubin 0.3 - 1.2 mg/dL 0.3  0.3  0.4   Alkaline Phos 38 - 126 U/L 69  73  69   AST 15 - 41 U/L 16  12  12    ALT 0 - 44 U/L 10  8  10     . Lab Results   Component Value Date   IRON 19 (L) 12/02/2022   TIBC 405 12/02/2022   IRONPCTSAT 5 (L) 12/02/2022   (Iron and TIBC)  Lab Results  Component Value Date   FERRITIN 4 (L) 12/02/2022    . Lab Results  Component Value Date   IRON 68 04/29/2022   TIBC 343 04/29/2022   IRONPCTSAT 20 04/29/2022   (Iron and TIBC)  Lab Results  Component Value Date   FERRITIN 11 04/29/2022   B12 ---225    RADIOGRAPHIC STUDIES: I have personally reviewed the radiological images as listed and agreed with the findings in the report. No results found.  ASSESSMENT & PLAN:   42 year old African American female with   #1 Microcytic anemia - likely due to severe iron deficiency.  Unclear etiology possibly chronic  hemorrhoidal bleeding versus heavy periods versus other GI losses (?NSAIDS associated ulceration) versus blood loss from ulcerations related to her hidradenitis suppurativa. Additional Elements causing anemia include chronic inflammatory state due to hydradenitis suppurativa and severe gout. Given the relatively high RBC numbers for the given hemoglobin cannot rule out underlying thalassemia/hemoglobinopathy.  #2 Thrombocytosis This is likely reactive from severe iron deficiency anemia, surgery, previous smoking, inflammation from her hidradenitis suppurativa and gout.  #3 B12 deficiency likely due to poor absorption #4 severe iron deficiency  PLAN:  -Discussed lab results on 12/02/2022 in detail with patient. CBC showed WBC of 7.1K, hemoglobin decreased to 8.6, and platelets of 255K. -patient is anemic at this time. Hemoglobin was previously 11.1 in January -educated patient that IV iron does not increase white blood counts -anemia may be partly due to inflammation from hidradenitis  -educated patient that Infliximab may suppress the immune system. Advised patient to be cautious of infections and to stay UTD with age-appropriate vaccinations -recommend a well-balanced and iron-rich  diet -will order IV iron for 3-4 infusions -will continue to monitor with labs in 4-6 months -recommend patient to connect with PCP/OB-GYN to discuss heavy menstrual cycles -advised patient to connect with PCP to consider stool testing to evaluate for gastrointestinal bleeding   FOLLOW-UP: Venofer 300mg  weekly x 4 doses RTC with Dr Candise Che with labs in 4 months  The total time spent in the appointment was 30 minutes* .  All of the patient's questions were answered with apparent satisfaction. The patient knows to call the clinic with any problems, questions or concerns.   Wyvonnia Lora MD MS AAHIVMS [facility] Hematology/Oncology Physician [facility]  .*Total Encounter Time as defined by the Centers for Medicare and Medicaid Services includes, in addition to the face-to-face time of a patient visit (documented in the note above) non-face-to-face time: obtaining and reviewing outside history, ordering and reviewing medications, tests or procedures, care coordination (communications with other health care professionals or caregivers) and documentation in the medical record.    I,Mitra Faeizi,acting as a Neurosurgeon for Wyvonnia Lora, MD.,have documented all relevant documentation on the behalf of Wyvonnia Lora, MD,as directed by  Wyvonnia Lora, MD while in the presence of Wyvonnia Lora, MD.  .I have reviewed the above documentation for accuracy and completeness, and I agree with the above. Johney Maine MD

## 2022-12-03 LAB — IRON AND IRON BINDING CAPACITY (CC-WL,HP ONLY)
Iron: 19 ug/dL — ABNORMAL LOW (ref 28–170)
Saturation Ratios: 5 % — ABNORMAL LOW (ref 10.4–31.8)
TIBC: 405 ug/dL (ref 250–450)
UIBC: 386 ug/dL

## 2022-12-04 ENCOUNTER — Telehealth: Payer: Self-pay | Admitting: Hematology

## 2022-12-08 ENCOUNTER — Encounter: Payer: Self-pay | Admitting: Hematology

## 2022-12-10 LAB — LAB REPORT - SCANNED: EGFR: 93

## 2022-12-21 ENCOUNTER — Emergency Department (HOSPITAL_BASED_OUTPATIENT_CLINIC_OR_DEPARTMENT_OTHER)
Admission: EM | Admit: 2022-12-21 | Discharge: 2022-12-21 | Disposition: A | Discharge status: Home / Self Care | Payer: Medicare HMO | Attending: Emergency Medicine | Admitting: Emergency Medicine

## 2022-12-21 ENCOUNTER — Encounter (HOSPITAL_BASED_OUTPATIENT_CLINIC_OR_DEPARTMENT_OTHER): Payer: Self-pay | Admitting: Emergency Medicine

## 2022-12-21 DIAGNOSIS — N939 Abnormal uterine and vaginal bleeding, unspecified: Secondary | ICD-10-CM | POA: Diagnosis present

## 2022-12-21 DIAGNOSIS — N938 Other specified abnormal uterine and vaginal bleeding: Secondary | ICD-10-CM | POA: Diagnosis not present

## 2022-12-21 DIAGNOSIS — D509 Iron deficiency anemia, unspecified: Secondary | ICD-10-CM | POA: Diagnosis not present

## 2022-12-21 LAB — CBC WITH DIFFERENTIAL/PLATELET
Abs Immature Granulocytes: 0.01 10*3/uL (ref 0.00–0.07)
Basophils Absolute: 0.1 10*3/uL (ref 0.0–0.1)
Basophils Relative: 1 %
Eosinophils Absolute: 0.1 10*3/uL (ref 0.0–0.5)
Eosinophils Relative: 1 %
HCT: 26.6 % — ABNORMAL LOW (ref 36.0–46.0)
Hemoglobin: 7.9 g/dL — ABNORMAL LOW (ref 12.0–15.0)
Immature Granulocytes: 0 %
Lymphocytes Relative: 47 %
Lymphs Abs: 4.8 10*3/uL — ABNORMAL HIGH (ref 0.7–4.0)
MCH: 21 pg — ABNORMAL LOW (ref 26.0–34.0)
MCHC: 29.7 g/dL — ABNORMAL LOW (ref 30.0–36.0)
MCV: 70.6 fL — ABNORMAL LOW (ref 80.0–100.0)
Monocytes Absolute: 0.8 10*3/uL (ref 0.1–1.0)
Monocytes Relative: 8 %
Neutro Abs: 4.4 10*3/uL (ref 1.7–7.7)
Neutrophils Relative %: 43 %
Platelets: 533 10*3/uL — ABNORMAL HIGH (ref 150–400)
RBC: 3.77 MIL/uL — ABNORMAL LOW (ref 3.87–5.11)
RDW: 17.7 % — ABNORMAL HIGH (ref 11.5–15.5)
WBC: 10.1 10*3/uL (ref 4.0–10.5)
nRBC: 0 % (ref 0.0–0.2)

## 2022-12-21 LAB — BASIC METABOLIC PANEL
Anion gap: 8 (ref 5–15)
BUN: 10 mg/dL (ref 6–20)
CO2: 24 mmol/L (ref 22–32)
Calcium: 8.5 mg/dL — ABNORMAL LOW (ref 8.9–10.3)
Chloride: 105 mmol/L (ref 98–111)
Creatinine, Ser: 0.67 mg/dL (ref 0.44–1.00)
GFR, Estimated: >60 mL/min (ref >60)
Glucose, Bld: 94 mg/dL (ref 70–99)
Potassium: 3.3 mmol/L — ABNORMAL LOW (ref 3.5–5.1)
Sodium: 137 mmol/L (ref 135–145)

## 2022-12-21 LAB — HCG, SERUM, QUALITATIVE: Preg, Serum: NEGATIVE

## 2022-12-21 MED ORDER — MEGESTROL ACETATE 40 MG PO TABS: ORAL_TABLET | ORAL | 0 refills | Status: AC

## 2022-12-21 MED ORDER — KETOROLAC TROMETHAMINE 15 MG/ML IJ SOLN
15.0000 mg | Freq: Once | INTRAMUSCULAR | Status: AC
  Administered 2022-12-21: 15 mg via INTRAVENOUS
  Filled 2022-12-21: qty 1

## 2022-12-21 MED ORDER — SODIUM CHLORIDE 0.9 % IV BOLUS
1000.0000 mL | Freq: Once | INTRAVENOUS | Status: AC
  Administered 2022-12-21: 1000 mL via INTRAVENOUS

## 2022-12-21 MED ORDER — MEGESTROL ACETATE 40 MG PO TABS
40.0000 mg | ORAL_TABLET | Freq: Once | ORAL | Status: AC
  Administered 2022-12-21: 40 mg via ORAL
  Filled 2022-12-21: qty 1

## 2022-12-21 NOTE — ED Provider Notes (Signed)
[facility] AT [facility] Provider Note   CSN: [id number] Arrival date & time: 12/21/22  0242     History  Chief Complaint  Patient presents with   Vaginal Bleeding    Amanda Davenport is a 42 y.o. female.  She is here with a complaint of heavy vaginal bleeding.  She said she started her period about 5 days ago at the usual time and it usually last for 3 days.  She said that her bleeding continued to look more days.  Today when she took her menstrual cup out clots that she has had clots going on all day..  Is a little more painful than usual.  Uses a heating pad.  Has not tried any medications.  She has a history of iron deficiency anemia and is afraid she is bleeding so much that it could cause a problem.  Denies any chest pain shortness of breath dizziness syncope.  Is not on any blood thinners.  Her only new medication is metformin and she started this 3 months ago  The history is provided by the patient.  Vaginal Bleeding Quality:  Dark red and clots Severity:  Severe Onset quality:  Gradual Duration:  5 days Timing:  Constant Progression:  Unchanged Chronicity:  New Menstrual history:  Regular Possible pregnancy: no   Context: spontaneously   Relieved by:  None tried Worsened by:  Nothing Ineffective treatments:  None tried Associated symptoms: abdominal pain and back pain   Associated symptoms: no dizziness, no dysuria, no fatigue, no fever and no nausea        Home Medications Prior to Admission medications   Medication Sig Start Date End Date Taking? Authorizing Provider  albuterol (VENTOLIN HFA) 108 (90 Base) MCG/ACT inhaler Inhale 1-2 puffs into the lungs every 6 (six) hours as needed for wheezing or shortness of breath.    [provider]  cyclobenzaprine (FLEXERIL) 5 MG tablet Take 1 tablet (5 mg total) by mouth 3 (three) times daily as needed for muscle spasm Patient taking differently: Take 5 mg by mouth 3 (three) times  daily as needed for muscle spasms. 03/23/22     ibuprofen (ADVIL) 800 MG tablet Take 800 mg by mouth 2 (two) times daily as needed for cramping.    [provider]  inFLIXimab (REMICADE IV) Inject into the vein every 30 (thirty) days.    [provider]  metoprolol succinate (TOPROL-XL) 25 MG 24 hr tablet Take 50 mg by mouth daily.    [provider]  ondansetron (ZOFRAN-ODT) 4 MG disintegrating tablet Take 4 mg by mouth every 8 (eight) hours as needed for nausea. 11/10/21   [provider]  oxyCODONE-acetaminophen (PERCOCET) 10-325 MG tablet Take 1 tablet by mouth 3 (three) times daily as needed for pain. Patient taking differently: Take 1 tablet by mouth every 8 (eight) hours as needed. 05/05/22     pregabalin (LYRICA) 50 MG capsule Take 50 mg by mouth 3 (three) times daily as needed.    [provider]  traZODone (DESYREL) 50 MG tablet Take 50-100 mg by mouth at bedtime as needed for sleep. 10/18/21   [provider]      Allergies    Other, Penicillins, Shrimp [shellfish allergy], and Sulfa antibiotics    Review of Systems   Review of Systems  Constitutional:  Negative for fatigue and fever.  Respiratory:  Negative for shortness of breath.   Cardiovascular:  Negative for chest pain.  Gastrointestinal:  Positive  for abdominal pain. Negative for nausea.  Genitourinary:  Positive for vaginal bleeding. Negative for dysuria.  Musculoskeletal:  Positive for back pain.  Neurological:  Negative for dizziness.    Physical Exam Updated Vital Signs BP (!) 138/91   Pulse (!) 102   Temp 98.7 F (37.1 C)   Resp 20   Ht 5\' 10"  (1.778 m)   Wt 130.6 kg   LMP 12/21/2022 (Exact Date)   SpO2 100%   BMI 41.32 kg/m  Physical Exam Vitals and nursing note reviewed.  Constitutional:      General: She is not in acute distress.    Appearance: Normal appearance. She is well-developed.  HENT:     Head: Normocephalic and atraumatic.  Eyes:      Conjunctiva/sclera: Conjunctivae normal.  Cardiovascular:     Rate and Rhythm: Regular rhythm. Tachycardia present.     Heart sounds: No murmur heard. Pulmonary:     Effort: Pulmonary effort is normal. No respiratory distress.     Breath sounds: Normal breath sounds.  Abdominal:     Palpations: Abdomen is soft.     Tenderness: There is no abdominal tenderness. There is no guarding or rebound.  Musculoskeletal:        General: No swelling.     Cervical back: Neck supple.  Skin:    General: Skin is warm and dry.     Capillary Refill: Capillary refill takes less than 2 seconds.  Neurological:     General: No focal deficit present.     Mental Status: She is alert.     ED Results / Procedures / Treatments   Labs (all labs ordered are listed, but only abnormal results are displayed) Labs Reviewed  BASIC METABOLIC PANEL - Abnormal; Notable for the following components:      Result Value   Potassium 3.3 (*)    Calcium 8.5 (*)    All other components within normal limits  CBC WITH DIFFERENTIAL/PLATELET - Abnormal; Notable for the following components:   RBC 3.77 (*)    Hemoglobin 7.9 (*)    HCT 26.6 (*)    MCV 70.6 (*)    MCH 21.0 (*)    MCHC 29.7 (*)    RDW 17.7 (*)    Platelets 533 (*)    Lymphs Abs 4.8 (*)    All other components within normal limits  HCG, SERUM, QUALITATIVE    EKG None  Radiology No results found.  Procedures Procedures    Medications Ordered in ED Medications  sodium chloride 0.9 % bolus 1,000 mL (1,000 mLs Intravenous New Bag/Given 12/21/22 0329)  megestrol (MEGACE) tablet 40 mg (40 mg Oral Given 12/21/22 0406)  ketorolac (TORADOL) 15 MG/ML injection 15 mg (15 mg Intravenous Given 12/21/22 0409)    ED Course/ Medical Decision Making/ A&P Clinical Course as of 12/21/22 0428  Mon Dec 21, 2022  8295 Discussed with GYN Dr. Charlotta Newton.  She was in agreement that Megace would be indicated.  She likes to use 3 tablets for 5 days and then 2 tablets for 5  days and then 1 tablet until finished.  Reviewed with patient and she is comfortable plan.  Recommended close follow-up with her GYN as they may want to check another hemoglobin. [MB]    Clinical Course User Index [MB] Terrilee Files, MD  Medical Decision Making Amount and/or Complexity of Data Reviewed Labs: ordered.  Risk Prescription drug management.   This patient complains of IV vaginal bleeding; this involves an extensive number of treatment Options and is a complaint that carries with it a high risk of complications and morbidity. The differential includes dysfunctional uterine bleeding, anemia, miscarriage  I ordered, reviewed and interpreted labs, which included CBC with hemoglobin of 7.9 down from 8.6 I ordered medication IV fluids IV Toradol oral Megace and reviewed PMP when indicated. Previous records obtained and reviewed in epic including recent oncology note by Dr. Candise Che for iron deficiency anemia I consulted Dr. Charlotta Newton gynecology and discussed lab and imaging findings and discussed disposition.  Cardiac monitoring reviewed, sinus rhythm Social determinants considered, patient with significant stressors decreased physical activity decreased social connections Critical Interventions: None  After the interventions stated above, I reevaluated the patient and found patient still to be borderline tachycardic blood pressure stable Admission and further testing considered, no indications for admission or further workup at this time.  Will start on Megace and recommended close follow-up with her GYN team.  Return instructions discussed         Final Clinical Impression(s) / ED Diagnoses Final diagnoses:  DUB (dysfunctional uterine bleeding)  Iron deficiency anemia, unspecified iron deficiency anemia type    Rx / DC Orders ED Discharge Orders          Ordered    megestrol (MEGACE) 40 MG tablet        12/21/22 0406               Terrilee Files, MD 12/21/22 0430

## 2022-12-21 NOTE — Discharge Instructions (Addendum)
You were seen in the emergency department for an unusual heavy period with clots.  Your hemoglobin is dropped to 7.9.  We are starting you on some medication to help stop your period.  Please contact your GYN as they may want to have you check another blood count soon.  Return to the emergency department if any worsening or concerning symptoms.  You should also take an iron supplement.

## 2022-12-21 NOTE — ED Triage Notes (Signed)
Pt to exam 14 c/o vaginal bleeding x 1 week. Pt states she has been on her period during this same time but bleeding has increased to "Big Dark Clots" today. Pt a/o x 4 skin warm dry intact. VSS NAD on room air. PT denies pain at this time.

## 2022-12-22 ENCOUNTER — Inpatient Hospital Stay: Payer: Medicare HMO | Attending: Hematology

## 2022-12-22 ENCOUNTER — Telehealth: Payer: Self-pay | Admitting: Hematology

## 2022-12-22 VITALS — BP 110/66 | HR 91 | Temp 98.0°F | Resp 19

## 2022-12-22 DIAGNOSIS — D509 Iron deficiency anemia, unspecified: Secondary | ICD-10-CM | POA: Diagnosis present

## 2022-12-22 DIAGNOSIS — D508 Other iron deficiency anemias: Secondary | ICD-10-CM

## 2022-12-22 MED ORDER — SODIUM CHLORIDE 0.9 % IV SOLN
300.0000 mg | Freq: Once | INTRAVENOUS | Status: AC
  Administered 2022-12-22: 300 mg via INTRAVENOUS
  Filled 2022-12-22: qty 300

## 2022-12-22 MED ORDER — LORATADINE 10 MG PO TABS
10.0000 mg | ORAL_TABLET | Freq: Once | ORAL | Status: AC
  Administered 2022-12-22: 10 mg via ORAL
  Filled 2022-12-22: qty 1

## 2022-12-22 MED ORDER — ACETAMINOPHEN 325 MG PO TABS
650.0000 mg | ORAL_TABLET | Freq: Once | ORAL | Status: AC
  Administered 2022-12-22: 650 mg via ORAL
  Filled 2022-12-22: qty 2

## 2022-12-22 MED ORDER — SODIUM CHLORIDE 0.9 % IV SOLN: Freq: Once | INTRAVENOUS | Status: AC

## 2022-12-22 NOTE — Patient Instructions (Signed)
Iron Sucrose Injection What is this medication? IRON SUCROSE (EYE ern SOO krose) treats low levels of iron (iron deficiency anemia) in people with kidney disease. Iron is a mineral that plays an important role in making red blood cells, which carry oxygen from your lungs to the rest of your body. This medicine may be used for other purposes; ask your health care provider or pharmacist if you have questions. COMMON BRAND NAME(S): Venofer What should I tell my care team before I take this medication? They need to know if you have any of these conditions: Anemia not caused by low iron levels Heart disease High levels of iron in the blood Kidney disease Liver disease An unusual or allergic reaction to iron, other medications, foods, dyes, or preservatives Pregnant or trying to get pregnant Breastfeeding How should I use this medication? This medication is for infusion into a vein. It is given in a hospital or clinic setting. Talk to your care team about the use of this medication in children. While this medication may be prescribed for children as young as 2 years for selected conditions, precautions do apply. Overdosage: If you think you have taken too much of this medicine contact a poison control center or emergency room at once. NOTE: This medicine is only for you. Do not share this medicine with others. What if I miss a dose? Keep appointments for follow-up doses. It is important not to miss your dose. Call your care team if you are unable to keep an appointment. What may interact with this medication? Do not take this medication with any of the following: Deferoxamine Dimercaprol Other iron products This medication may also interact with the following: Chloramphenicol Deferasirox This list may not describe all possible interactions. Give your health care provider a list of all the medicines, herbs, non-prescription drugs, or dietary supplements you use. Also tell them if you smoke,  drink alcohol, or use illegal drugs. Some items may interact with your medicine. What should I watch for while using this medication? Visit your care team regularly. Tell your care team if your symptoms do not start to get better or if they get worse. You may need blood work done while you are taking this medication. You may need to follow a special diet. Talk to your care team. Foods that contain iron include: whole grains/cereals, dried fruits, beans, or peas, leafy green vegetables, and organ meats (liver, kidney). What side effects may I notice from receiving this medication? Side effects that you should report to your care team as soon as possible: Allergic reactions--skin rash, itching, hives, swelling of the face, lips, tongue, or throat Low blood pressure--dizziness, feeling faint or lightheaded, blurry vision Shortness of breath Side effects that usually do not require medical attention (report to your care team if they continue or are bothersome): Flushing Headache Joint pain Muscle pain Nausea Pain, redness, or irritation at injection site This list may not describe all possible side effects. Call your doctor for medical advice about side effects. You may report side effects to FDA at 1-800-FDA-1088. Where should I keep my medication? This medication is given in a hospital or clinic. It will not be stored at home. NOTE: This sheet is a summary. It may not cover all possible information. If you have questions about this medicine, talk to your doctor, pharmacist, or health care provider.  2024 Elsevier/Gold Standard (2022-08-28 00:00:00)

## 2022-12-22 NOTE — Progress Notes (Signed)
Pt declined to stay for post 30 min obs, discharged with VSS, ambulatory to lobby

## 2022-12-28 ENCOUNTER — Inpatient Hospital Stay: Payer: Medicare HMO

## 2022-12-28 VITALS — BP 109/68 | HR 89 | Temp 98.4°F | Resp 18

## 2022-12-28 DIAGNOSIS — D508 Other iron deficiency anemias: Secondary | ICD-10-CM

## 2022-12-28 DIAGNOSIS — D509 Iron deficiency anemia, unspecified: Secondary | ICD-10-CM | POA: Diagnosis not present

## 2022-12-28 MED ORDER — SODIUM CHLORIDE 0.9 % IV SOLN: INTRAVENOUS | Status: DC

## 2022-12-28 MED ORDER — ACETAMINOPHEN 325 MG PO TABS
650.0000 mg | ORAL_TABLET | Freq: Once | ORAL | Status: AC
  Administered 2022-12-28: 650 mg via ORAL
  Filled 2022-12-28: qty 2

## 2022-12-28 MED ORDER — LORATADINE 10 MG PO TABS
10.0000 mg | ORAL_TABLET | Freq: Once | ORAL | Status: AC
  Administered 2022-12-28: 10 mg via ORAL
  Filled 2022-12-28: qty 1

## 2022-12-28 MED ORDER — SODIUM CHLORIDE 0.9 % IV SOLN
300.0000 mg | Freq: Once | INTRAVENOUS | Status: AC
  Administered 2022-12-28: 300 mg via INTRAVENOUS
  Filled 2022-12-28: qty 300

## 2022-12-28 NOTE — Patient Instructions (Signed)
Iron Sucrose Injection What is this medication? IRON SUCROSE (EYE ern SOO krose) treats low levels of iron (iron deficiency anemia) in people with kidney disease. Iron is a mineral that plays an important role in making red blood cells, which carry oxygen from your lungs to the rest of your body. This medicine may be used for other purposes; ask your health care provider or pharmacist if you have questions. COMMON BRAND NAME(S): Venofer What should I tell my care team before I take this medication? They need to know if you have any of these conditions: Anemia not caused by low iron levels Heart disease High levels of iron in the blood Kidney disease Liver disease An unusual or allergic reaction to iron, other medications, foods, dyes, or preservatives Pregnant or trying to get pregnant Breastfeeding How should I use this medication? This medication is for infusion into a vein. It is given in a hospital or clinic setting. Talk to your care team about the use of this medication in children. While this medication may be prescribed for children as young as 2 years for selected conditions, precautions do apply. Overdosage: If you think you have taken too much of this medicine contact a poison control center or emergency room at once. NOTE: This medicine is only for you. Do not share this medicine with others. What if I miss a dose? Keep appointments for follow-up doses. It is important not to miss your dose. Call your care team if you are unable to keep an appointment. What may interact with this medication? Do not take this medication with any of the following: Deferoxamine Dimercaprol Other iron products This medication may also interact with the following: Chloramphenicol Deferasirox This list may not describe all possible interactions. Give your health care provider a list of all the medicines, herbs, non-prescription drugs, or dietary supplements you use. Also tell them if you smoke,  drink alcohol, or use illegal drugs. Some items may interact with your medicine. What should I watch for while using this medication? Visit your care team regularly. Tell your care team if your symptoms do not start to get better or if they get worse. You may need blood work done while you are taking this medication. You may need to follow a special diet. Talk to your care team. Foods that contain iron include: whole grains/cereals, dried fruits, beans, or peas, leafy green vegetables, and organ meats (liver, kidney). What side effects may I notice from receiving this medication? Side effects that you should report to your care team as soon as possible: Allergic reactions--skin rash, itching, hives, swelling of the face, lips, tongue, or throat Low blood pressure--dizziness, feeling faint or lightheaded, blurry vision Shortness of breath Side effects that usually do not require medical attention (report to your care team if they continue or are bothersome): Flushing Headache Joint pain Muscle pain Nausea Pain, redness, or irritation at injection site This list may not describe all possible side effects. Call your doctor for medical advice about side effects. You may report side effects to FDA at 1-800-FDA-1088. Where should I keep my medication? This medication is given in a hospital or clinic. It will not be stored at home. NOTE: This sheet is a summary. It may not cover all possible information. If you have questions about this medicine, talk to your doctor, pharmacist, or health care provider.  2024 Elsevier/Gold Standard (2022-08-28 00:00:00)

## 2022-12-28 NOTE — Progress Notes (Signed)
Pt declined 30 min observation. VSS at discharge.

## 2023-01-05 ENCOUNTER — Inpatient Hospital Stay: Payer: Medicare HMO

## 2023-01-05 ENCOUNTER — Inpatient Hospital Stay: Payer: Medicare HMO | Attending: Hematology

## 2023-01-05 VITALS — BP 109/69 | HR 81 | Temp 98.2°F | Resp 18

## 2023-01-05 DIAGNOSIS — D508 Other iron deficiency anemias: Secondary | ICD-10-CM

## 2023-01-05 DIAGNOSIS — D509 Iron deficiency anemia, unspecified: Secondary | ICD-10-CM | POA: Insufficient documentation

## 2023-01-05 MED ORDER — ACETAMINOPHEN 325 MG PO TABS
650.0000 mg | ORAL_TABLET | Freq: Once | ORAL | Status: AC
  Administered 2023-01-05: 650 mg via ORAL
  Filled 2023-01-05: qty 2

## 2023-01-05 MED ORDER — LORATADINE 10 MG PO TABS
10.0000 mg | ORAL_TABLET | Freq: Once | ORAL | Status: AC
  Administered 2023-01-05: 10 mg via ORAL
  Filled 2023-01-05: qty 1

## 2023-01-05 MED ORDER — SODIUM CHLORIDE 0.9 % IV SOLN
300.0000 mg | Freq: Once | INTRAVENOUS | Status: AC
  Administered 2023-01-05: 300 mg via INTRAVENOUS
  Filled 2023-01-05: qty 300

## 2023-01-05 MED ORDER — SODIUM CHLORIDE 0.9 % IV SOLN: Freq: Once | INTRAVENOUS | Status: AC

## 2023-01-05 NOTE — Progress Notes (Signed)
Patient declined to stay for 30 minute post observation period.  Patient educated on importance of staying for observation period and verbalized understanding.  VSS upon leaving infusion room.

## 2023-01-05 NOTE — Patient Instructions (Signed)
Iron Sucrose Injection What is this medication? IRON SUCROSE (EYE ern SOO krose) treats low levels of iron (iron deficiency anemia) in people with kidney disease. Iron is a mineral that plays an important role in making red blood cells, which carry oxygen from your lungs to the rest of your body. This medicine may be used for other purposes; ask your health care provider or pharmacist if you have questions. COMMON BRAND NAME(S): Venofer What should I tell my care team before I take this medication? They need to know if you have any of these conditions: Anemia not caused by low iron levels Heart disease High levels of iron in the blood Kidney disease Liver disease An unusual or allergic reaction to iron, other medications, foods, dyes, or preservatives Pregnant or trying to get pregnant Breastfeeding How should I use this medication? This medication is for infusion into a vein. It is given in a hospital or clinic setting. Talk to your care team about the use of this medication in children. While this medication may be prescribed for children as young as 2 years for selected conditions, precautions do apply. Overdosage: If you think you have taken too much of this medicine contact a poison control center or emergency room at once. NOTE: This medicine is only for you. Do not share this medicine with others. What if I miss a dose? Keep appointments for follow-up doses. It is important not to miss your dose. Call your care team if you are unable to keep an appointment. What may interact with this medication? Do not take this medication with any of the following: Deferoxamine Dimercaprol Other iron products This medication may also interact with the following: Chloramphenicol Deferasirox This list may not describe all possible interactions. Give your health care provider a list of all the medicines, herbs, non-prescription drugs, or dietary supplements you use. Also tell them if you smoke,  drink alcohol, or use illegal drugs. Some items may interact with your medicine. What should I watch for while using this medication? Visit your care team regularly. Tell your care team if your symptoms do not start to get better or if they get worse. You may need blood work done while you are taking this medication. You may need to follow a special diet. Talk to your care team. Foods that contain iron include: whole grains/cereals, dried fruits, beans, or peas, leafy green vegetables, and organ meats (liver, kidney). What side effects may I notice from receiving this medication? Side effects that you should report to your care team as soon as possible: Allergic reactions--skin rash, itching, hives, swelling of the face, lips, tongue, or throat Low blood pressure--dizziness, feeling faint or lightheaded, blurry vision Shortness of breath Side effects that usually do not require medical attention (report to your care team if they continue or are bothersome): Flushing Headache Joint pain Muscle pain Nausea Pain, redness, or irritation at injection site This list may not describe all possible side effects. Call your doctor for medical advice about side effects. You may report side effects to FDA at 1-800-FDA-1088. Where should I keep my medication? This medication is given in a hospital or clinic. It will not be stored at home. NOTE: This sheet is a summary. It may not cover all possible information. If you have questions about this medicine, talk to your doctor, pharmacist, or health care provider.  2024 Elsevier/Gold Standard (2022-08-28 00:00:00)

## 2023-01-12 ENCOUNTER — Inpatient Hospital Stay: Payer: Medicare HMO

## 2023-01-12 ENCOUNTER — Other Ambulatory Visit: Payer: Self-pay | Admitting: Hematology

## 2023-01-12 VITALS — BP 102/84 | HR 87 | Temp 98.0°F | Resp 18

## 2023-01-12 DIAGNOSIS — D509 Iron deficiency anemia, unspecified: Secondary | ICD-10-CM | POA: Diagnosis not present

## 2023-01-12 DIAGNOSIS — D508 Other iron deficiency anemias: Secondary | ICD-10-CM

## 2023-01-12 MED ORDER — SODIUM CHLORIDE 0.9 % IV SOLN
300.0000 mg | Freq: Once | INTRAVENOUS | Status: AC
  Administered 2023-01-12: 300 mg via INTRAVENOUS
  Filled 2023-01-12: qty 300

## 2023-01-12 MED ORDER — SODIUM CHLORIDE 0.9 % IV SOLN: INTRAVENOUS | Status: AC

## 2023-01-12 MED ORDER — LORATADINE 10 MG PO TABS
10.0000 mg | ORAL_TABLET | Freq: Once | ORAL | Status: AC
  Administered 2023-01-12: 10 mg via ORAL
  Filled 2023-01-12: qty 1

## 2023-01-12 MED ORDER — ACETAMINOPHEN 325 MG PO TABS
650.0000 mg | ORAL_TABLET | Freq: Once | ORAL | Status: AC
  Administered 2023-01-12: 650 mg via ORAL
  Filled 2023-01-12: qty 2

## 2023-01-12 NOTE — Patient Instructions (Signed)
Iron Sucrose Injection What is this medication? IRON SUCROSE (EYE ern SOO krose) treats low levels of iron (iron deficiency anemia) in people with kidney disease. Iron is a mineral that plays an important role in making red blood cells, which carry oxygen from your lungs to the rest of your body. This medicine may be used for other purposes; ask your health care provider or pharmacist if you have questions. COMMON BRAND NAME(S): Venofer What should I tell my care team before I take this medication? They need to know if you have any of these conditions: Anemia not caused by low iron levels Heart disease High levels of iron in the blood Kidney disease Liver disease An unusual or allergic reaction to iron, other medications, foods, dyes, or preservatives Pregnant or trying to get pregnant Breastfeeding How should I use this medication? This medication is for infusion into a vein. It is given in a hospital or clinic setting. Talk to your care team about the use of this medication in children. While this medication may be prescribed for children as young as 2 years for selected conditions, precautions do apply. Overdosage: If you think you have taken too much of this medicine contact a poison control center or emergency room at once. NOTE: This medicine is only for you. Do not share this medicine with others. What if I miss a dose? Keep appointments for follow-up doses. It is important not to miss your dose. Call your care team if you are unable to keep an appointment. What may interact with this medication? Do not take this medication with any of the following: Deferoxamine Dimercaprol Other iron products This medication may also interact with the following: Chloramphenicol Deferasirox This list may not describe all possible interactions. Give your health care provider a list of all the medicines, herbs, non-prescription drugs, or dietary supplements you use. Also tell them if you smoke,  drink alcohol, or use illegal drugs. Some items may interact with your medicine. What should I watch for while using this medication? Visit your care team regularly. Tell your care team if your symptoms do not start to get better or if they get worse. You may need blood work done while you are taking this medication. You may need to follow a special diet. Talk to your care team. Foods that contain iron include: whole grains/cereals, dried fruits, beans, or peas, leafy green vegetables, and organ meats (liver, kidney). What side effects may I notice from receiving this medication? Side effects that you should report to your care team as soon as possible: Allergic reactions--skin rash, itching, hives, swelling of the face, lips, tongue, or throat Low blood pressure--dizziness, feeling faint or lightheaded, blurry vision Shortness of breath Side effects that usually do not require medical attention (report to your care team if they continue or are bothersome): Flushing Headache Joint pain Muscle pain Nausea Pain, redness, or irritation at injection site This list may not describe all possible side effects. Call your doctor for medical advice about side effects. You may report side effects to FDA at 1-800-FDA-1088. Where should I keep my medication? This medication is given in a hospital or clinic. It will not be stored at home. NOTE: This sheet is a summary. It may not cover all possible information. If you have questions about this medicine, talk to your doctor, pharmacist, or health care provider.  2024 Elsevier/Gold Standard (2022-08-28 00:00:00)

## 2023-01-12 NOTE — Progress Notes (Signed)
Pt declined 30 minute observation period post Venofer infusion. Tolerated infusion well. V/S normal at time of discharge.

## 2023-03-05 ENCOUNTER — Other Ambulatory Visit: Payer: Self-pay

## 2023-03-05 DIAGNOSIS — D509 Iron deficiency anemia, unspecified: Secondary | ICD-10-CM

## 2023-03-08 ENCOUNTER — Inpatient Hospital Stay (HOSPITAL_BASED_OUTPATIENT_CLINIC_OR_DEPARTMENT_OTHER): Payer: Medicare HMO | Admitting: Hematology

## 2023-03-08 ENCOUNTER — Inpatient Hospital Stay: Payer: Medicare HMO | Attending: Hematology

## 2023-03-08 VITALS — BP 120/85 | HR 101 | Temp 97.7°F | Resp 20 | Wt 291.8 lb

## 2023-03-08 DIAGNOSIS — D509 Iron deficiency anemia, unspecified: Secondary | ICD-10-CM | POA: Insufficient documentation

## 2023-03-08 DIAGNOSIS — D75838 Other thrombocytosis: Secondary | ICD-10-CM | POA: Diagnosis not present

## 2023-03-08 DIAGNOSIS — N920 Excessive and frequent menstruation with regular cycle: Secondary | ICD-10-CM | POA: Diagnosis not present

## 2023-03-08 DIAGNOSIS — E538 Deficiency of other specified B group vitamins: Secondary | ICD-10-CM | POA: Diagnosis not present

## 2023-03-08 LAB — CBC WITH DIFFERENTIAL (CANCER CENTER ONLY)
Abs Immature Granulocytes: 0.02 10*3/uL (ref 0.00–0.07)
Basophils Absolute: 0.1 10*3/uL (ref 0.0–0.1)
Basophils Relative: 1 %
Eosinophils Absolute: 0.1 10*3/uL (ref 0.0–0.5)
Eosinophils Relative: 1 %
HCT: 37.8 % (ref 36.0–46.0)
Hemoglobin: 12 g/dL (ref 12.0–15.0)
Immature Granulocytes: 0 %
Lymphocytes Relative: 36 %
Lymphs Abs: 3.2 10*3/uL (ref 0.7–4.0)
MCH: 25.6 pg — ABNORMAL LOW (ref 26.0–34.0)
MCHC: 31.7 g/dL (ref 30.0–36.0)
MCV: 80.8 fL (ref 80.0–100.0)
Monocytes Absolute: 0.5 10*3/uL (ref 0.1–1.0)
Monocytes Relative: 5 %
Neutro Abs: 5.2 10*3/uL (ref 1.7–7.7)
Neutrophils Relative %: 57 %
Platelet Count: 410 10*3/uL — ABNORMAL HIGH (ref 150–400)
RBC: 4.68 MIL/uL (ref 3.87–5.11)
RDW: 21.2 % — ABNORMAL HIGH (ref 11.5–15.5)
WBC Count: 9.1 10*3/uL (ref 4.0–10.5)
nRBC: 0 % (ref 0.0–0.2)

## 2023-03-08 LAB — CMP (CANCER CENTER ONLY)
ALT: 8 U/L (ref 0–44)
AST: 12 U/L — ABNORMAL LOW (ref 15–41)
Albumin: 3.7 g/dL (ref 3.5–5.0)
Alkaline Phosphatase: 65 U/L (ref 38–126)
Anion gap: 7 (ref 5–15)
BUN: 10 mg/dL (ref 6–20)
CO2: 23 mmol/L (ref 22–32)
Calcium: 9 mg/dL (ref 8.9–10.3)
Chloride: 107 mmol/L (ref 98–111)
Creatinine: 0.8 mg/dL (ref 0.44–1.00)
GFR, Estimated: >60 mL/min (ref >60)
Glucose, Bld: 116 mg/dL — ABNORMAL HIGH (ref 70–99)
Potassium: 3.3 mmol/L — ABNORMAL LOW (ref 3.5–5.1)
Sodium: 137 mmol/L (ref 135–145)
Total Bilirubin: 0.3 mg/dL (ref <1.2)
Total Protein: 8.7 g/dL — ABNORMAL HIGH (ref 6.5–8.1)

## 2023-03-08 LAB — FERRITIN: Ferritin: 21 ng/mL (ref 11–307)

## 2023-03-08 LAB — IRON AND IRON BINDING CAPACITY (CC-WL,HP ONLY)
Iron: 50 ug/dL (ref 28–170)
Saturation Ratios: 14 % (ref 10.4–31.8)
TIBC: 353 ug/dL (ref 250–450)
UIBC: 303 ug/dL (ref 148–442)

## 2023-03-08 NOTE — Progress Notes (Signed)
HEMATOLOGY/ONCOLOGY CLINIC NOTE  Date of Service: 03/08/23   PCP Hill[facility] Aldo Rheumatology (Gout management) Sharl Ma MD Cardiology - [place]  CHIEF COMPLAINTS/PURPOSE OF CONSULTATION:  For continued management of iron deficiency anemia with reactive thrombocytosis  HISTORY OF PRESENTING ILLNESS:  Amanda Davenport is a wonderful 42 y.o. female who has been referred to [place] for evaluation and management of thrombocytosis.  She was last seen by [place] in 2016 and was lost to follow-up due to issues with her medical insurance.  Patient has been referred back to [place] for evaluation of her intermittent thrombocytosis and severe iron deficiency anemia. The pt reports her hidradenitis suppurativa has been quite bothersome and she had surgery on 4/23-- left HS excision--still has an open surgical wound with some oozing .  Had blood loss during surgery as well.  Patient notes she has had significant issues with severe gout attacks- prednisone, allopurinol/colchicine/febostat.  She is following with Dr.Khiem Adin Hector MD for her rheumatology cares.  She continues to have heavy menstrual periods lasting 5 days of which 4 are very heavy. Has been taking Geritol  1 month and also has continued taking prenatal vitamins No personal history of VTE No FHx of VTE, bleeding or clotting disorders but mother had PE in the setting of surgery and active breast cancer. Quit smoking tobacco- 07/2019. 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  On review of systems, pt reports severe fatigue and some ice cravings.  No fevers chills night sweats or new bone pains.  INTERVAL HISTORY:  Amanda Davenport is a 42 y.o. female here for  continued evaluation and management of her iron deficiency anemia.   Patient was last seen by me on 12/02/2022 and she complained of lethargy, fatigue, and pagophagia.   Patient is accompanied by a family member during this visit. She notes she has been doing well overall since our last visit. She tolerating IV Iron infusion without any new or severe toxicities.   She is currently on birth control and medication, which has been controlling her heavy menstrual bleeding.   She denies any new infection issues, fever, chills, night sweats, unexpected weight loss, abdominal pain, chest pain, back pain, or leg swelling.   She denies taking oral iron supplement. She has started taking B-Complex.   Patient is scheduled for pilonidal cyst surgery next week.    MEDICAL HISTORY:  Past Medical History:  Diagnosis Date   Anxiety    Asthma    06-02-2022  per pt has had asthma since having covid in 2021,  last used rescue inhaler last week   Axillary hidradenitis suppurativa    dermatologist--- dr r. [facility]   B12 deficiency    Depression    History of local infection of skin and subcutaneous tissue    recurrent infected boils   History of sepsis 05/2021   admission in epic ;  secondary to cellulitis due to hydradenitis supprative right axilla   HSV-2 infection    IDA (iron deficiency anemia)    hemtologist--- dr Candise Che;  treated with infusions   Idiopathic chronic gout of multiple sites without tophus  followed by rheumonatologist-- dr Kirtland Bouchard. Adin Hector   Inflammatory arthritis    rheumonatologist---dr r. szer;  treated remicade   Neuropathy    Other thrombocytosis    followed by dr Candise Che;  intermittant   Pilonidal cyst    Wears glasses    WPW (Wolff-Parkinson-White syndrome) 08/2011   (06-02-2022  pt denies symptoms & never had syncope / near syncope) cardiologist--- dr g. taylor; (first dx ED 05/ 2013)  per lov in epic 04-07-2022  concealed wpw patterened    SURGICAL HISTORY: Past  Surgical History:  Procedure Laterality Date   ADJACENT TISSUE TRANSFER/TISSUE REARRANGEMENT Left 07/28/2019   Procedure: ADJACENT TISSUE TRANSFER TO LEFT AXILLA GREATER THAN 100 CM SQUARED;  Surgeon: Glenna Fellows, MD;  Location: [facility];  Service: Plastics;  Laterality: Left;   COLONOSCOPY WITH PROPOFOL  01/04/2013   dr d. Juanda Chance   HYDRADENITIS EXCISION Left 07/28/2019   Procedure: EXCISION LEFT HIDRADENITIS AXILLA;  Surgeon: Berna Bue, MD;  Location: [facility];  Service: General;  Laterality: Left;   INCISION AND DRAINAGE PERITONSILLAR ABSCESS Left 12/27/2004   @[facility]  by dr Lazarus Salines   PILONIDAL CYST EXCISION N/A 06/05/2022   Procedure: TREPHINATION OF PILONIDAL CYST;  Surgeon: Berna Bue, MD;  Location: [facility];  Service: General;  Laterality: N/A;    SOCIAL HISTORY: Social History   Socioeconomic History   Marital status: Single    Spouse name: Not on file   Number of children: 0   Years of education: Not on file   Highest education level: Not on file  Occupational History   Occupation: disabled  Tobacco Use   Smoking status: Former    Current packs/day: 0.00    Types: Cigarettes    Start date: 2012    Quit date: 2013    Years since quitting: 11.9   Smokeless tobacco: Never  Vaping Use   Vaping status: Never Used  Substance and Sexual Activity   Alcohol use: No   Drug use: Yes    Types: Marijuana    Comment: 06-02-2022  per pt smokes daily   Sexual activity: Yes    Partners: Male    Birth control/protection: Condom  Other Topics Concern   Not on file  Social History Narrative   Not on file   Social Determinants of Health   Financial Resource Strain: Low Risk  (12/28/2020)   Overall Financial Resource Strain (CARDIA)    Difficulty of Paying Living Expenses: Not hard at all  Food Insecurity: No Food Insecurity (12/28/2020)   Hunger Vital Sign    Worried About Running Out of Food in the Last Year: Never true    Ran Out of Food in the  Last Year: Never true  Transportation Needs: No Transportation Needs (12/28/2020)   PRAPARE - Administrator, Civil Service (Medical): No    Lack of Transportation (Non-Medical): No  Physical Activity: Inactive (12/28/2020)   Exercise Vital Sign    Days of Exercise per Week: 0 days    Minutes of Exercise per Session: 0 min  Stress: Stress Concern Present (12/28/2020)   [employer] of Occupational Health - Occupational Stress Questionnaire    Feeling of Stress : To some extent  Social Connections: Socially Isolated (12/28/2020)   Social Connection and Isolation Panel [NHANES]    Frequency of Communication with Friends and Family: More than three times a week    Frequency of Social Gatherings with Friends and Family: Never    Attends Religious Services:  Never    Active Member of Clubs or Organizations: No    Attends Banker Meetings: Never    Marital Status: Never married  Intimate Partner Violence: Not At Risk (12/28/2020)   Humiliation, Afraid, Rape, and Kick questionnaire    Fear of Current or Ex-Partner: No    Emotionally Abused: No    Physically Abused: No    Sexually Abused: No    FAMILY HISTORY: Family History  Problem Relation Age of Onset   Breast cancer Mother    Pulmonary embolism Mother        died of PE   Colon cancer Mother    Irritable bowel syndrome Mother    Cancer Mother    Hypertension Father    Diabetes Paternal Grandmother    Heart disease Neg Hx    Stroke Neg Hx     ALLERGIES:  is allergic to other, penicillins, shrimp [shellfish allergy], and sulfa antibiotics.  MEDICATIONS:  Current Outpatient Medications  Medication Sig Dispense Refill   albuterol (VENTOLIN HFA) 108 (90 Base) MCG/ACT inhaler Inhale 1-2 puffs into the lungs every 6 (six) hours as needed for wheezing or shortness of breath.     cyclobenzaprine (FLEXERIL) 5 MG tablet Take 1 tablet (5 mg total) by mouth 3 (three) times daily as needed for muscle spasm  (Patient taking differently: Take 5 mg by mouth 3 (three) times daily as needed for muscle spasms.) 45 tablet 0   ibuprofen (ADVIL) 800 MG tablet Take 800 mg by mouth 2 (two) times daily as needed for cramping.     inFLIXimab (REMICADE IV) Inject into the vein every 30 (thirty) days.     megestrol (MEGACE) 40 MG tablet 3 tablets daily for 5 days then 2 tablets for 5 days and then 1 tablet until finished 30 tablet 0   metoprolol succinate (TOPROL-XL) 25 MG 24 hr tablet Take 50 mg by mouth daily.     ondansetron (ZOFRAN-ODT) 4 MG disintegrating tablet Take 4 mg by mouth every 8 (eight) hours as needed for nausea.     oxyCODONE-acetaminophen (PERCOCET) 10-325 MG tablet Take 1 tablet by mouth 3 (three) times daily as needed for pain. (Patient taking differently: Take 1 tablet by mouth every 8 (eight) hours as needed.) 90 tablet 0   pregabalin (LYRICA) 50 MG capsule Take 50 mg by mouth 3 (three) times daily as needed.     traZODone (DESYREL) 50 MG tablet Take 50-100 mg by mouth at bedtime as needed for sleep.     No current facility-administered medications for this visit.   REVIEW OF SYSTEMS:  10 Point review of Systems was done is negative except as noted above.   PHYSICAL EXAMINATION: ECOG PERFORMANCE STATUS: 2 - Symptomatic, <50% confined to bed  . Vitals:   03/08/23 1017  BP: 120/85  Pulse: (!) 101  Resp: 20  Temp: 97.7 F (36.5 C)  SpO2: 99%    Filed Weights   03/08/23 1017  Weight: 291 lb 12.8 oz (132.4 kg)   .Body mass index is 41.87 kg/m. GENERAL:alert, in no acute distress and comfortable SKIN: no acute rashes, no significant lesions EYES: conjunctiva are pink and non-injected, sclera anicteric OROPHARYNX: MMM, no exudates, no oropharyngeal erythema or ulceration NECK: supple, no JVD LYMPH:  no palpable lymphadenopathy in the cervical, axillary or inguinal regions LUNGS: clear to auscultation b/l with normal respiratory effort HEART: regular rate & rhythm ABDOMEN:   normoactive bowel sounds , non tender, not distended. Extremity: no pedal edema  PSYCH: alert & oriented x 3 with fluent speech NEURO: no focal motor/sensory deficits   LABORATORY DATA:  I have reviewed the data as listed  .    Latest Ref Rng & Units 03/08/2023    9:58 AM 12/21/2022    3:18 AM 12/02/2022   10:38 AM  CBC  WBC 4.0 - 10.5 K/uL 9.1  10.1  7.1   Hemoglobin 12.0 - 15.0 g/dL 16.1  7.9  8.6   Hematocrit 36.0 - 46.0 % 37.8  26.6  28.9   Platelets 150 - 400 K/uL 410  533  255    . CBC    Component Value Date/Time   WBC 9.1 03/08/2023 0958   WBC 10.1 12/21/2022 0318   RBC 4.68 03/08/2023 0958   HGB 12.0 03/08/2023 0958   HGB 8.7 (L) 01/11/2020 1015   HGB 9.5 (L) 12/18/2014 1518   HCT 37.8 03/08/2023 0958   HCT 31.9 (L) 01/11/2020 1015   HCT 31.4 (L) 12/18/2014 1518   PLT 410 (H) 03/08/2023 0958   PLT 639 (H) 01/11/2020 1015   MCV 80.8 03/08/2023 0958   MCV 71 (L) 01/11/2020 1015   MCV 67.0 (L) 12/18/2014 1518   MCH 25.6 (L) 03/08/2023 0958   MCHC 31.7 03/08/2023 0958   RDW 21.2 (H) 03/08/2023 0958   RDW 18.6 (H) 01/11/2020 1015   RDW 26.0 (H) 12/18/2014 1518   LYMPHSABS 3.2 03/08/2023 0958   LYMPHSABS 2.0 01/11/2020 1015   LYMPHSABS 1.6 12/18/2014 1518   MONOABS 0.5 03/08/2023 0958   MONOABS 0.3 12/18/2014 1518   EOSABS 0.1 03/08/2023 0958   EOSABS 0.2 01/11/2020 1015   BASOSABS 0.1 03/08/2023 0958   BASOSABS 0.1 01/11/2020 1015   BASOSABS 0.0 12/18/2014 1518    .    Latest Ref Rng & Units 03/08/2023    9:58 AM 12/21/2022    3:18 AM 12/02/2022   10:38 AM  CMP  Glucose 70 - 99 mg/dL 096  94  045   BUN 6 - 20 mg/dL 10  10  8    Creatinine 0.44 - 1.00 mg/dL 4.09  8.11  9.14   Sodium 135 - 145 mmol/L 137  137  139   Potassium 3.5 - 5.1 mmol/L 3.3  3.3  3.5   Chloride 98 - 111 mmol/L 107  105  107   CO2 22 - 32 mmol/L 23  24  23    Calcium 8.9 - 10.3 mg/dL 9.0  8.5  9.0   Total Protein 6.5 - 8.1 g/dL 8.7   9.0   Total Bilirubin <1.2 mg/dL 0.3   0.3    Alkaline Phos 38 - 126 U/L 65   69   AST 15 - 41 U/L 12   16   ALT 0 - 44 U/L 8   10    . Lab Results  Component Value Date   IRON 50 03/08/2023   TIBC 353 03/08/2023   IRONPCTSAT 14 03/08/2023   (Iron and TIBC)  Lab Results  Component Value Date   FERRITIN 21 03/08/2023    . Lab Results  Component Value Date   IRON 19 (L) 12/02/2022   TIBC 405 12/02/2022   IRONPCTSAT 5 (L) 12/02/2022   (Iron and TIBC)  Lab Results  Component Value Date   FERRITIN 4 (L) 12/02/2022   B12 ---225    RADIOGRAPHIC STUDIES: I have personally reviewed the radiological images as listed and agreed with the findings in the report. No results found.  ASSESSMENT &  PLAN:   42 year old African American female with   #1 Microcytic anemia - likely due to severe iron deficiency.  Unclear etiology possibly chronic hemorrhoidal bleeding versus heavy periods versus other GI losses (?NSAIDS associated ulceration) versus blood loss from ulcerations related to her hidradenitis suppurativa. Additional Elements causing anemia include chronic inflammatory state due to hydradenitis suppurativa and severe gout. Given the relatively high RBC numbers for the given hemoglobin cannot rule out underlying thalassemia/hemoglobinopathy.  #2 Thrombocytosis This is likely reactive from severe iron deficiency anemia, surgery, previous smoking, inflammation from her hidradenitis suppurativa and gout.  #3 B12 deficiency likely due to poor absorption #4 severe iron deficiency  PLAN:  -Discussed lab results from today, 03/08/2023, in detail with the patient. CBC shows improved hgb and hct with elevated platelets but improved from 533 K to 410 K. CMP shows low potassium level of 3.3 mmol/L and elevated total protein level of 8.7.  -Recommend the option of Oral iron or IV Iron infusion if iron labs shows ferritin level below 100. Patient wants to go with Iv Iron infusion if ferritin is below 100.  Ferritin 21 with iron  saturation of 14% -she will need 2 additional doses of venofer 300mg  weekly  -Continue Vitamin B-Complex.  -Answered all of patient's questions.   FOLLOW-UP: RTC with Dr Candise Che with labs in 6 months   The total time spent in the appointment was 23 minutes* .  All of the patient's questions were answered with apparent satisfaction. The patient knows to call the clinic with any problems, questions or concerns.   Wyvonnia Lora MD MS AAHIVMS [facility] Hematology/Oncology Physician [facility]  .*Total Encounter Time as defined by the Centers for Medicare and Medicaid Services includes, in addition to the face-to-face time of a patient visit (documented in the note above) non-face-to-face time: obtaining and reviewing outside history, ordering and reviewing medications, tests or procedures, care coordination (communications with other health care professionals or caregivers) and documentation in the medical record.   I,Param Shah,acting as a Neurosurgeon for Wyvonnia Lora, MD.,have documented all relevant documentation on the behalf of Wyvonnia Lora, MD,as directed by  Wyvonnia Lora, MD while in the presence of Wyvonnia Lora, MD.  .I have reviewed the above documentation for accuracy and completeness, and I agree with the above. Johney Maine MD

## 2023-03-14 ENCOUNTER — Encounter: Payer: Self-pay | Admitting: Hematology

## 2023-03-17 ENCOUNTER — Other Ambulatory Visit: Payer: Medicare HMO

## 2023-03-17 ENCOUNTER — Ambulatory Visit: Payer: Medicare HMO | Admitting: Hematology

## 2023-04-22 ENCOUNTER — Other Ambulatory Visit: Payer: Self-pay | Admitting: Hematology

## 2023-04-28 ENCOUNTER — Telehealth: Payer: Self-pay | Admitting: Hematology

## 2023-04-28 NOTE — Telephone Encounter (Signed)
 Left patient a vm regarding upcoming appointment

## 2023-05-11 ENCOUNTER — Inpatient Hospital Stay: Payer: Medicare HMO | Attending: Hematology

## 2023-05-11 VITALS — BP 118/78 | HR 88 | Temp 98.2°F | Resp 18

## 2023-05-11 DIAGNOSIS — D508 Other iron deficiency anemias: Secondary | ICD-10-CM

## 2023-05-11 DIAGNOSIS — D509 Iron deficiency anemia, unspecified: Secondary | ICD-10-CM | POA: Diagnosis present

## 2023-05-11 MED ORDER — LORATADINE 10 MG PO TABS
10.0000 mg | ORAL_TABLET | Freq: Once | ORAL | Status: AC
  Administered 2023-05-11: 10 mg via ORAL
  Filled 2023-05-11: qty 1

## 2023-05-11 MED ORDER — ACETAMINOPHEN 325 MG PO TABS
650.0000 mg | ORAL_TABLET | Freq: Once | ORAL | Status: AC
  Administered 2023-05-11: 650 mg via ORAL
  Filled 2023-05-11: qty 2

## 2023-05-11 MED ORDER — IRON SUCROSE 20 MG/ML IV SOLN
300.0000 mg | Freq: Once | INTRAVENOUS | Status: AC
  Administered 2023-05-11: 300 mg via INTRAVENOUS
  Filled 2023-05-11: qty 300

## 2023-05-12 ENCOUNTER — Telehealth: Payer: Self-pay | Admitting: Hematology

## 2023-05-15 ENCOUNTER — Inpatient Hospital Stay: Payer: Medicare HMO

## 2023-05-15 VITALS — BP 103/59 | HR 75 | Temp 98.2°F | Resp 18

## 2023-05-15 DIAGNOSIS — D508 Other iron deficiency anemias: Secondary | ICD-10-CM

## 2023-05-15 DIAGNOSIS — D509 Iron deficiency anemia, unspecified: Secondary | ICD-10-CM | POA: Diagnosis not present

## 2023-05-15 MED ORDER — ACETAMINOPHEN 325 MG PO TABS
650.0000 mg | ORAL_TABLET | Freq: Once | ORAL | Status: AC
Start: 2023-05-15 — End: 2023-05-15
  Administered 2023-05-15: 650 mg via ORAL
  Filled 2023-05-15: qty 2

## 2023-05-15 MED ORDER — SODIUM CHLORIDE 0.9 % IV SOLN: INTRAVENOUS | Status: DC

## 2023-05-15 MED ORDER — LORATADINE 10 MG PO TABS
10.0000 mg | ORAL_TABLET | Freq: Once | ORAL | Status: AC
Start: 2023-05-15 — End: 2023-05-15
  Administered 2023-05-15: 10 mg via ORAL
  Filled 2023-05-15: qty 1

## 2023-05-15 MED ORDER — SODIUM CHLORIDE 0.9 % IV SOLN
300.0000 mg | Freq: Once | INTRAVENOUS | Status: AC
  Administered 2023-05-15: 300 mg via INTRAVENOUS
  Filled 2023-05-15: qty 300

## 2023-05-18 ENCOUNTER — Inpatient Hospital Stay: Payer: Medicare HMO

## 2023-06-01 ENCOUNTER — Other Ambulatory Visit: Payer: Medicare HMO

## 2023-06-01 ENCOUNTER — Ambulatory Visit: Payer: Medicare HMO | Admitting: Hematology

## 2023-09-01 ENCOUNTER — Other Ambulatory Visit: Payer: Self-pay

## 2023-09-01 DIAGNOSIS — D509 Iron deficiency anemia, unspecified: Secondary | ICD-10-CM

## 2023-09-04 NOTE — Progress Notes (Signed)
 HEMATOLOGY/ONCOLOGY CLINIC NOTE  Date of Service: 09/06/2023   PCP Maryellen Snare Rheumatology (Gout management) Dianna Fortis MD Cardiology - [facility]  CHIEF COMPLAINTS/PURPOSE OF CONSULTATION:  For continued management of iron  deficiency anemia with reactive thrombocytosis  HISTORY OF PRESENTING ILLNESS:  Amanda Davenport is a wonderful 43 y.o. female who has been referred to us  for evaluation and management of thrombocytosis.  She was last seen by us  in 2016 and was lost to follow-up due to issues with her medical insurance.  Patient has been referred back to us  for evaluation of her intermittent thrombocytosis and severe iron  deficiency anemia. The pt reports her hidradenitis suppurativa has been quite bothersome and she had surgery on 4/23-- left HS excision--still has an open surgical wound with some oozing .  Had blood loss during surgery as well.  Patient notes she has had significant issues with severe gout attacks- prednisone , allopurinol /colchicine Feliciana Horn.  She is following with Dr.Khiem Jerrlyn Morel MD for her rheumatology cares.  She continues to have heavy menstrual periods lasting 5 days of which 4 are very heavy. Has been taking Geritol  1 month and also has continued taking prenatal vitamins No personal history of VTE No FHx of VTE, bleeding or clotting disorders but mother had PE in the setting of surgery and active breast cancer. Quit smoking tobacco- 07/2019. Off medications for WPW.- previously on metoprolol  Had lost medical insurance - counseled on need to f/u with PCP  Most recent lab results (01/19/2020) of CBC is as follows: all values are WNL except for Hgb at 9.1, HCT at 29.9, MCV at 65.1, MCH at 19.8, MCHC at 30.5, RDW at 20.2, MPV at 6.6, PLT at 784K, CO2 at 20.  On review of systems, pt reports severe fatigue and some ice cravings.  No fevers chills night sweats or new bone pains.  INTERVAL HISTORY:  Amanda Davenport is a 43 y.o. female here for  continued evaluation and management of her iron  deficiency anemia.   Patient was last seen by me on 03/08/2023 and was doing well overall.   Denies any acute new fatigue. No  overt GI bleeding. Patient notes her HS is stable with no significant bleeding from her HS lesions. Labs done today discussed in details.   MEDICAL HISTORY:  Past Medical History:  Diagnosis Date   Anxiety    Asthma    06-02-2022  per pt has had asthma since having covid in 2021,  last used rescue inhaler last week   Axillary hidradenitis suppurativa    dermatologist--- dr r. [facility]ich[facility]   B12 deficiency    Depression    History of local infection of skin and subcutaneous tissue    recurrent infected boils   History of sepsis 05/2021   admission in epic ;  secondary to cellulitis due to hydradenitis supprative right axilla   HSV-2 infection    IDA (iron  deficiency anemia)    hemtologist--- dr Salomon Cree;  treated with infusions   Idiopathic chronic gout of multiple sites without tophus    followed by rheumonatologist-- dr Virgel Griffes   Inflammatory arthritis    rheumonatologist---dr r. szer;  treated remicade   Neuropathy    Other thrombocytosis    followed by dr Salomon Cree;  intermittant   Pilonidal cyst    Wears glasses    WPW (Wolff-Parkinson-White syndrome) 08/2011   (06-02-2022  pt denies symptoms & never had syncope / near syncope) cardiologist--- dr g. taylor; (first dx ED  05/ 2013)  per lov in epic 04-07-2022  concealed wpw patterened    SURGICAL HISTORY: Past Surgical History:  Procedure Laterality Date   ADJACENT TISSUE TRANSFER/TISSUE REARRANGEMENT Left 07/28/2019   Procedure: ADJACENT TISSUE TRANSFER TO LEFT AXILLA GREATER THAN 100 CM SQUARED;  Surgeon: Alger Infield, MD;  Location: [facility];  Service: Plastics;  Laterality: Left;   COLONOSCOPY WITH PROPOFOL   01/04/2013   dr d. Grandville Lax   HYDRADENITIS EXCISION Left 07/28/2019   Procedure: EXCISION LEFT HIDRADENITIS AXILLA;  Surgeon: Adalberto Acton, MD;  Location: [facility];  Service: General;  Laterality: Left;   INCISION AND DRAINAGE PERITONSILLAR ABSCESS Left 12/27/2004   @[facility]  by dr Archer Kobs   PILONIDAL CYST EXCISION N/A 06/05/2022   Procedure: TREPHINATION OF PILONIDAL CYST;  Surgeon: Adalberto Acton, MD;  Location: [facility];  Service: General;  Laterality: N/A;    SOCIAL HISTORY: Social History   Socioeconomic History   Marital status: Single    Spouse name: Not on file   Number of children: 0   Years of education: Not on file   Highest education level: Not on file  Occupational History   Occupation: disabled  Tobacco Use   Smoking status: Former    Current packs/day: 0.00    Types: Cigarettes    Start date: 2012    Quit date: 2013    Years since quitting: 12.4   Smokeless tobacco: Never  Vaping Use   Vaping status: Never Used  Substance and Sexual Activity   Alcohol use: No   Drug use: Yes    Types: Marijuana    Comment: 06-02-2022  per pt smokes daily   Sexual activity: Yes    Partners: Male    Birth control/protection: Condom  Other Topics Concern   Not on file  Social History Narrative   Not on file   Social Drivers of Health   Financial Resource Strain: Low Risk  (12/28/2020)   Overall Financial Resource Strain (CARDIA)    Difficulty of Paying Living Expenses: Not hard at all  Food Insecurity: No Food Insecurity (12/28/2020)   Hunger Vital Sign    Worried About Running Out of Food in the Last Year: Never true    Ran Out of Food in the Last Year: Never true  Transportation Needs: No Transportation Needs (12/28/2020)   PRAPARE - Administrator, Civil Service (Medical): No    Lack of Transportation (Non-Medical): No  Physical Activity: Inactive (12/28/2020)   Exercise Vital Sign    Days of Exercise per Week: 0 days    Minutes of Exercise per Session: 0 min  Stress: Stress Concern Present (12/28/2020)   [employer] of Occupational Health - Occupational Stress  Questionnaire    Feeling of Stress : To some extent  Social Connections: Socially Isolated (12/28/2020)   Social Connection and Isolation Panel [NHANES]    Frequency of Communication with Friends and Family: More than three times a week    Frequency of Social Gatherings with Friends and Family: Never    Attends Religious Services: Never    Database administrator or Organizations: No    Attends Banker Meetings: Never    Marital Status: Never married  Intimate Partner Violence: Not At Risk (12/28/2020)   Humiliation, Afraid, Rape, and Kick questionnaire    Fear of Current or Ex-Partner: No    Emotionally Abused: No    Physically Abused: No    Sexually Abused: No  FAMILY HISTORY: Family History  Problem Relation Age of Onset   Breast cancer Mother    Pulmonary embolism Mother        died of PE   Colon cancer Mother    Irritable bowel syndrome Mother    Cancer Mother    Hypertension Father    Diabetes Paternal Grandmother    Heart disease Neg Hx    Stroke Neg Hx     ALLERGIES:  is allergic to other, penicillins, shrimp [shellfish allergy], and sulfa  antibiotics.  MEDICATIONS:  Current Outpatient Medications  Medication Sig Dispense Refill   albuterol  (VENTOLIN  HFA) 108 (90 Base) MCG/ACT inhaler Inhale 1-2 puffs into the lungs every 6 (six) hours as needed for wheezing or shortness of breath.     cyclobenzaprine  (FLEXERIL ) 5 MG tablet Take 1 tablet (5 mg total) by mouth 3 (three) times daily as needed for muscle spasm (Patient taking differently: Take 5 mg by mouth 3 (three) times daily as needed for muscle spasms.) 45 tablet 0   ibuprofen  (ADVIL ) 800 MG tablet Take 800 mg by mouth 2 (two) times daily as needed for cramping.     inFLIXimab (REMICADE IV) Inject into the vein every 30 (thirty) days.     megestrol  (MEGACE ) 40 MG tablet 3 tablets daily for 5 days then 2 tablets for 5 days and then 1 tablet until finished 30 tablet 0   metoprolol  succinate (TOPROL -XL)  25 MG 24 hr tablet Take 50 mg by mouth daily.     ondansetron  (ZOFRAN -ODT) 4 MG disintegrating tablet Take 4 mg by mouth every 8 (eight) hours as needed for nausea.     oxyCODONE -acetaminophen  (PERCOCET) 10-325 MG tablet Take 1 tablet by mouth 3 (three) times daily as needed for pain. (Patient taking differently: Take 1 tablet by mouth every 8 (eight) hours as needed.) 90 tablet 0   pregabalin (LYRICA) 50 MG capsule Take 50 mg by mouth 3 (three) times daily as needed.     traZODone  (DESYREL ) 50 MG tablet Take 50-100 mg by mouth at bedtime as needed for sleep.     No current facility-administered medications for this visit.   REVIEW OF SYSTEMS:  10 Point review of Systems was done is negative except as noted above.   PHYSICAL EXAMINATION: ECOG PERFORMANCE STATUS: 2 - Symptomatic, <50% confined to bed  . Vitals:   09/06/23 1430  BP: 108/64  Pulse: 83  Resp: 20  Temp: (!) 97.3 F (36.3 C)  SpO2: 97%   Filed Weights   09/06/23 1430  Weight: 297 lb 9.6 oz (135 kg)  .Body mass index is 42.7 kg/m. GENERAL:alert, in no acute distress and comfortable SKIN: no acute rashes, no significant lesions EYES: conjunctiva are pink and non-injected, sclera anicteric OROPHARYNX: MMM, no exudates, no oropharyngeal erythema or ulceration NECK: supple, no JVD LYMPH:  no palpable lymphadenopathy in the cervical, axillary or inguinal regions LUNGS: clear to auscultation b/l with normal respiratory effort HEART: regular rate & rhythm ABDOMEN:  normoactive bowel sounds , non tender, not distended. Extremity: no pedal edema PSYCH: alert & oriented x 3 with fluent speech NEURO: no focal motor/sensory deficits   LABORATORY DATA:  I have reviewed the data as listed  .    Latest Ref Rng & Units 09/06/2023    1:40 PM 03/08/2023    9:58 AM 12/21/2022    3:18 AM  CBC  WBC 4.0 - 10.5 K/uL 7.4  9.1  10.1   Hemoglobin 12.0 - 15.0 g/dL 11.9  12.0  7.9   Hematocrit 36.0 - 46.0 % 36.3  37.8  26.6    Platelets 150 - 400 K/uL 406  410  533    . CBC    Component Value Date/Time   WBC 9.1 03/08/2023 0958   WBC 10.1 12/21/2022 0318   RBC 4.68 03/08/2023 0958   HGB 12.0 03/08/2023 0958   HGB 8.7 (L) 01/11/2020 1015   HGB 9.5 (L) 12/18/2014 1518   HCT 37.8 03/08/2023 0958   HCT 31.9 (L) 01/11/2020 1015   HCT 31.4 (L) 12/18/2014 1518   PLT 410 (H) 03/08/2023 0958   PLT 639 (H) 01/11/2020 1015   MCV 80.8 03/08/2023 0958   MCV 71 (L) 01/11/2020 1015   MCV 67.0 (L) 12/18/2014 1518   MCH 25.6 (L) 03/08/2023 0958   MCHC 31.7 03/08/2023 0958   RDW 21.2 (H) 03/08/2023 0958   RDW 18.6 (H) 01/11/2020 1015   RDW 26.0 (H) 12/18/2014 1518   LYMPHSABS 3.2 03/08/2023 0958   LYMPHSABS 2.0 01/11/2020 1015   LYMPHSABS 1.6 12/18/2014 1518   MONOABS 0.5 03/08/2023 0958   MONOABS 0.3 12/18/2014 1518   EOSABS 0.1 03/08/2023 0958   EOSABS 0.2 01/11/2020 1015   BASOSABS 0.1 03/08/2023 0958   BASOSABS 0.1 01/11/2020 1015   BASOSABS 0.0 12/18/2014 1518    .    Latest Ref Rng & Units 09/06/2023    1:40 PM 03/08/2023    9:58 AM 12/21/2022    3:18 AM  CMP  Glucose 70 - 99 mg/dL 409  811  94   BUN 6 - 20 mg/dL 10  10  10    Creatinine 0.44 - 1.00 mg/dL 9.14  7.82  9.56   Sodium 135 - 145 mmol/L 138  137  137   Potassium 3.5 - 5.1 mmol/L 3.6  3.3  3.3   Chloride 98 - 111 mmol/L 107  107  105   CO2 22 - 32 mmol/L 24  23  24    Calcium  8.9 - 10.3 mg/dL 9.1  9.0  8.5   Total Protein 6.5 - 8.1 g/dL 8.7  8.7    Total Bilirubin 0.0 - 1.2 mg/dL 0.3  0.3    Alkaline Phos 38 - 126 U/L 64  65    AST 15 - 41 U/L 17  12    ALT 0 - 44 U/L 11  8     . Lab Results  Component Value Date   IRON  45 09/06/2023   TIBC 346 09/06/2023   IRONPCTSAT 13 09/06/2023   (Iron  and TIBC)  Lab Results  Component Value Date   FERRITIN 26 09/06/2023    RADIOGRAPHIC STUDIES: I have personally reviewed the radiological images as listed and agreed with the findings in the report. No results found.  ASSESSMENT &  PLAN:   43 year old African American female with   #1 Microcytic anemia - likely due to severe iron  deficiency.  Unclear etiology possibly chronic hemorrhoidal bleeding versus heavy periods versus other GI losses (?NSAIDS associated ulceration) versus blood loss from ulcerations related to her hidradenitis suppurativa. Additional Elements causing anemia include chronic inflammatory state due to hydradenitis suppurativa and severe gout. Given the relatively high RBC numbers for the given hemoglobin cannot rule out underlying thalassemia/hemoglobinopathy.  #2 Thrombocytosis This is likely reactive from severe iron  deficiency anemia, surgery, previous smoking, inflammation from her hidradenitis suppurativa and gout.  #3 B12 deficiency likely due to poor absorption #4 severe iron  deficiency  PLAN:  -Discussed lab results from  today, 09/06/2023, in detail with patient -cbc hgb 11.9with plt of 406k and normal WBC counts Ferritin still low at 26 with iron  saturation of 13% which is lower than ferritin goal of >50 and iron  saturation of >20% -will offer patient additional IV iron  replacement if agreeable-- will order  FOLLOW-UP: Phone visit with Dr Salomon Cree in 6 months Labs 1-2 days prior to phone visit   The total time spent in the appointment was 20 minutes* .  All of the patient's questions were answered with apparent satisfaction. The patient knows to call the clinic with any problems, questions or concerns.   Jacquelyn Matt MD MS AAHIVMS [facility] Hematology/Oncology Physician [facility]  .*Total Encounter Time as defined by the Centers for Medicare and Medicaid Services includes, in addition to the face-to-face time of a patient visit (documented in the note above) non-face-to-face time: obtaining and reviewing outside history, ordering and reviewing medications, tests or procedures, care coordination (communications with other health care professionals or caregivers) and  documentation in the medical record.    I,Mitra Faeizi,acting as a Neurosurgeon for Jacquelyn Matt, MD.,have documented all relevant documentation on the behalf of Jacquelyn Matt, MD,as directed by  Jacquelyn Matt, MD while in the presence of Jacquelyn Matt, MD.  .I have reviewed the above documentation for accuracy and completeness, and I agree with the above. .Aleyssa Pike Kishore Kehlani Vancamp MD

## 2023-09-06 ENCOUNTER — Inpatient Hospital Stay: Payer: Medicare HMO | Attending: Hematology | Admitting: Hematology

## 2023-09-06 ENCOUNTER — Inpatient Hospital Stay: Payer: Medicare HMO

## 2023-09-06 VITALS — BP 108/64 | HR 83 | Temp 97.3°F | Resp 20 | Wt 297.6 lb

## 2023-09-06 DIAGNOSIS — M109 Gout, unspecified: Secondary | ICD-10-CM | POA: Insufficient documentation

## 2023-09-06 DIAGNOSIS — L732 Hidradenitis suppurativa: Secondary | ICD-10-CM | POA: Diagnosis not present

## 2023-09-06 DIAGNOSIS — D75838 Other thrombocytosis: Secondary | ICD-10-CM | POA: Insufficient documentation

## 2023-09-06 DIAGNOSIS — E538 Deficiency of other specified B group vitamins: Secondary | ICD-10-CM | POA: Insufficient documentation

## 2023-09-06 DIAGNOSIS — D509 Iron deficiency anemia, unspecified: Secondary | ICD-10-CM | POA: Insufficient documentation

## 2023-09-06 DIAGNOSIS — N920 Excessive and frequent menstruation with regular cycle: Secondary | ICD-10-CM | POA: Diagnosis not present

## 2023-09-06 LAB — CBC WITH DIFFERENTIAL (CANCER CENTER ONLY)
Abs Immature Granulocytes: 0.01 10*3/uL (ref 0.00–0.07)
Basophils Absolute: 0.1 10*3/uL (ref 0.0–0.1)
Basophils Relative: 1 %
Eosinophils Absolute: 0.1 10*3/uL (ref 0.0–0.5)
Eosinophils Relative: 1 %
HCT: 36.3 % (ref 36.0–46.0)
Hemoglobin: 11.9 g/dL — ABNORMAL LOW (ref 12.0–15.0)
Immature Granulocytes: 0 %
Lymphocytes Relative: 49 %
Lymphs Abs: 3.6 10*3/uL (ref 0.7–4.0)
MCH: 26.9 pg (ref 26.0–34.0)
MCHC: 32.8 g/dL (ref 30.0–36.0)
MCV: 82.1 fL (ref 80.0–100.0)
Monocytes Absolute: 0.3 10*3/uL (ref 0.1–1.0)
Monocytes Relative: 4 %
Neutro Abs: 3.4 10*3/uL (ref 1.7–7.7)
Neutrophils Relative %: 45 %
Platelet Count: 406 10*3/uL — ABNORMAL HIGH (ref 150–400)
RBC: 4.42 MIL/uL (ref 3.87–5.11)
RDW: 16.3 % — ABNORMAL HIGH (ref 11.5–15.5)
WBC Count: 7.4 10*3/uL (ref 4.0–10.5)
nRBC: 0 % (ref 0.0–0.2)

## 2023-09-06 LAB — CMP (CANCER CENTER ONLY)
ALT: 11 U/L (ref 0–44)
AST: 17 U/L (ref 15–41)
Albumin: 3.7 g/dL (ref 3.5–5.0)
Alkaline Phosphatase: 64 U/L (ref 38–126)
Anion gap: 7 (ref 5–15)
BUN: 10 mg/dL (ref 6–20)
CO2: 24 mmol/L (ref 22–32)
Calcium: 9.1 mg/dL (ref 8.9–10.3)
Chloride: 107 mmol/L (ref 98–111)
Creatinine: 0.82 mg/dL (ref 0.44–1.00)
GFR, Estimated: >60 mL/min (ref >60)
Glucose, Bld: 137 mg/dL — ABNORMAL HIGH (ref 70–99)
Potassium: 3.6 mmol/L (ref 3.5–5.1)
Sodium: 138 mmol/L (ref 135–145)
Total Bilirubin: 0.3 mg/dL (ref 0.0–1.2)
Total Protein: 8.7 g/dL — ABNORMAL HIGH (ref 6.5–8.1)

## 2023-09-06 LAB — FERRITIN: Ferritin: 26 ng/mL (ref 11–307)

## 2023-09-06 LAB — IRON AND IRON BINDING CAPACITY (CC-WL,HP ONLY)
Iron: 45 ug/dL (ref 28–170)
Saturation Ratios: 13 % (ref 10.4–31.8)
TIBC: 346 ug/dL (ref 250–450)
UIBC: 301 ug/dL (ref 148–442)

## 2023-09-13 ENCOUNTER — Encounter: Payer: Self-pay | Admitting: Hematology

## 2023-09-21 NOTE — Progress Notes (Signed)
 Contacted pt per Dr Salomon Cree to let pt know: cbc hgb 11.9with plt of 406k and normal WBC counts  Ferritin still low at 26 with iron  saturation of 13% which is lower than ferritin goal of >50 and iron  saturation of >20%  -will offer patient additional IV iron  replacement if agreeable-  Pt acknowledged information and verbalized inderstanding Pt agreeable to get iron 

## 2023-10-05 ENCOUNTER — Other Ambulatory Visit: Payer: Self-pay | Admitting: Hematology

## 2023-10-05 NOTE — Progress Notes (Signed)
IV Venofer orders placed.

## 2023-10-06 ENCOUNTER — Encounter: Payer: Self-pay | Admitting: Hematology

## 2023-10-06 ENCOUNTER — Telehealth: Payer: Self-pay

## 2023-10-06 NOTE — Telephone Encounter (Signed)
 Dr. Onesimo, patient will be scheduled as soon as possible.  Auth Submission: NO AUTH NEEDED Site of care: Site of care: [facility] Payer: [employer] medicare and [place] medicaid Medication & CPT/J Code(s) submitted: Venofer  (Iron  Sucrose) J1756 Diagnosis Code:  Route of submission (phone, fax, portal):  Phone # Fax # Auth type: Buy/Bill PB Units/visits requested: 300mg  x 3 doses Reference number:  Approval from: 10/06/23 to 01/06/24

## 2023-10-12 ENCOUNTER — Encounter: Payer: Self-pay | Admitting: Hematology

## 2023-10-19 ENCOUNTER — Ambulatory Visit

## 2023-10-19 VITALS — BP 110/75 | HR 86 | Temp 98.0°F | Resp 22 | Ht 70.0 in | Wt 303.4 lb

## 2023-10-19 DIAGNOSIS — D509 Iron deficiency anemia, unspecified: Secondary | ICD-10-CM | POA: Diagnosis not present

## 2023-10-19 DIAGNOSIS — D508 Other iron deficiency anemias: Secondary | ICD-10-CM

## 2023-10-19 MED ORDER — DIPHENHYDRAMINE HCL 25 MG PO CAPS
25.0000 mg | ORAL_CAPSULE | Freq: Once | ORAL | Status: AC
  Administered 2023-10-19: 25 mg via ORAL
  Filled 2023-10-19: qty 1

## 2023-10-19 MED ORDER — SODIUM CHLORIDE 0.9 % IV SOLN
300.0000 mg | Freq: Once | INTRAVENOUS | Status: AC
  Administered 2023-10-19: 300 mg via INTRAVENOUS
  Filled 2023-10-19: qty 15

## 2023-10-19 MED ORDER — ACETAMINOPHEN 325 MG PO TABS
650.0000 mg | ORAL_TABLET | Freq: Once | ORAL | Status: AC
  Administered 2023-10-19: 650 mg via ORAL
  Filled 2023-10-19: qty 2

## 2023-10-19 NOTE — Progress Notes (Signed)
 Diagnosis: Iron  Deficiency Anemia  Provider:  Praveen Mannam MD  Procedure: IV Infusion  IV Type: Peripheral, IV Location: R Antecubital  Venofer  (Iron  Sucrose), Dose: 300 mg  Infusion Start Time: 1341  Infusion Stop Time: 1523  Post Infusion IV Care: Patient declined observation and Peripheral IV Discontinued  Discharge: Condition: Good, Destination: Home . AVS Declined  Performed by:  Maximiano JONELLE Pouch, LPN

## 2023-10-26 ENCOUNTER — Ambulatory Visit

## 2023-10-26 VITALS — BP 98/72 | HR 82 | Temp 98.2°F | Resp 16 | Ht 69.0 in | Wt 296.6 lb

## 2023-10-26 DIAGNOSIS — D509 Iron deficiency anemia, unspecified: Secondary | ICD-10-CM

## 2023-10-26 DIAGNOSIS — D508 Other iron deficiency anemias: Secondary | ICD-10-CM

## 2023-10-26 MED ORDER — ACETAMINOPHEN 325 MG PO TABS
650.0000 mg | ORAL_TABLET | Freq: Once | ORAL | Status: AC
  Administered 2023-10-26: 650 mg via ORAL
  Filled 2023-10-26: qty 2

## 2023-10-26 MED ORDER — SODIUM CHLORIDE 0.9 % IV SOLN
300.0000 mg | Freq: Once | INTRAVENOUS | Status: AC
  Administered 2023-10-26: 300 mg via INTRAVENOUS
  Filled 2023-10-26: qty 15

## 2023-10-26 MED ORDER — DIPHENHYDRAMINE HCL 25 MG PO CAPS
25.0000 mg | ORAL_CAPSULE | Freq: Once | ORAL | Status: AC
  Administered 2023-10-26: 25 mg via ORAL
  Filled 2023-10-26: qty 1

## 2023-10-26 NOTE — Progress Notes (Signed)
 Diagnosis: Iron  Deficiency Anemia  Provider:  Praveen Mannam MD  Procedure: IV Infusion  IV Type: Peripheral, IV Location: R Antecubital  Venofer  (Iron  Sucrose), Dose: 300 mg  Infusion Start Time: 1350  Infusion Stop Time: 1536  Post Infusion IV Care: Patient declined observation and Peripheral IV Discontinued  Discharge: Condition: Good, Destination: Home . AVS Declined  Performed by:  Leita FORBES Miles, LPN

## 2023-11-02 ENCOUNTER — Ambulatory Visit (INDEPENDENT_AMBULATORY_CARE_PROVIDER_SITE_OTHER)

## 2023-11-02 VITALS — BP 103/71 | HR 71 | Temp 98.1°F | Resp 18 | Ht 69.0 in | Wt 303.8 lb

## 2023-11-02 DIAGNOSIS — D509 Iron deficiency anemia, unspecified: Secondary | ICD-10-CM | POA: Diagnosis not present

## 2023-11-02 DIAGNOSIS — D508 Other iron deficiency anemias: Secondary | ICD-10-CM

## 2023-11-02 MED ORDER — SODIUM CHLORIDE 0.9 % IV SOLN
300.0000 mg | Freq: Once | INTRAVENOUS | Status: AC
  Administered 2023-11-02: 300 mg via INTRAVENOUS
  Filled 2023-11-02: qty 15

## 2023-11-02 MED ORDER — DIPHENHYDRAMINE HCL 25 MG PO CAPS
25.0000 mg | ORAL_CAPSULE | Freq: Once | ORAL | Status: AC
  Administered 2023-11-02: 25 mg via ORAL
  Filled 2023-11-02: qty 1

## 2023-11-02 MED ORDER — ACETAMINOPHEN 325 MG PO TABS
650.0000 mg | ORAL_TABLET | Freq: Once | ORAL | Status: AC
Start: 2023-11-02 — End: 2023-11-02
  Administered 2023-11-02: 650 mg via ORAL
  Filled 2023-11-02: qty 2

## 2023-11-02 NOTE — Progress Notes (Signed)
 Diagnosis: Iron  Deficiency Anemia  Provider:  Praveen Mannam MD  Procedure: IV Infusion  IV Type: Peripheral, IV Location: R Hand  Venofer  (Iron  Sucrose), Dose: 300 mg  Infusion Start Time: 1338  Infusion Stop Time: 1510  Post Infusion IV Care: Patient declined observation and Peripheral IV Discontinued  Discharge: Condition: Good, Destination: Home . AVS Declined  Performed by:  Maximiano JONELLE Pouch, LPN

## 2024-03-14 ENCOUNTER — Other Ambulatory Visit: Payer: Self-pay

## 2024-03-14 DIAGNOSIS — D509 Iron deficiency anemia, unspecified: Secondary | ICD-10-CM

## 2024-03-15 ENCOUNTER — Inpatient Hospital Stay: Admitting: Hematology

## 2024-03-15 ENCOUNTER — Inpatient Hospital Stay: Attending: Hematology

## 2024-03-15 VITALS — BP 130/83 | HR 81 | Temp 97.2°F | Resp 20 | Wt 303.0 lb

## 2024-03-15 DIAGNOSIS — D75839 Thrombocytosis, unspecified: Secondary | ICD-10-CM | POA: Insufficient documentation

## 2024-03-15 DIAGNOSIS — Z803 Family history of malignant neoplasm of breast: Secondary | ICD-10-CM | POA: Insufficient documentation

## 2024-03-15 DIAGNOSIS — N920 Excessive and frequent menstruation with regular cycle: Secondary | ICD-10-CM | POA: Insufficient documentation

## 2024-03-15 DIAGNOSIS — D509 Iron deficiency anemia, unspecified: Secondary | ICD-10-CM | POA: Diagnosis not present

## 2024-03-15 DIAGNOSIS — Z800 Family history of malignant neoplasm of digestive organs: Secondary | ICD-10-CM | POA: Insufficient documentation

## 2024-03-15 DIAGNOSIS — Z79899 Other long term (current) drug therapy: Secondary | ICD-10-CM | POA: Insufficient documentation

## 2024-03-15 DIAGNOSIS — E538 Deficiency of other specified B group vitamins: Secondary | ICD-10-CM | POA: Insufficient documentation

## 2024-03-15 LAB — CBC WITH DIFFERENTIAL (CANCER CENTER ONLY)
Abs Immature Granulocytes: 0.01 K/uL (ref 0.00–0.07)
Basophils Absolute: 0.1 K/uL (ref 0.0–0.1)
Basophils Relative: 1 %
Eosinophils Absolute: 0.1 K/uL (ref 0.0–0.5)
Eosinophils Relative: 2 %
HCT: 34.9 % — ABNORMAL LOW (ref 36.0–46.0)
Hemoglobin: 11.4 g/dL — ABNORMAL LOW (ref 12.0–15.0)
Immature Granulocytes: 0 %
Lymphocytes Relative: 45 %
Lymphs Abs: 3.4 K/uL (ref 0.7–4.0)
MCH: 27.7 pg (ref 26.0–34.0)
MCHC: 32.7 g/dL (ref 30.0–36.0)
MCV: 84.7 fL (ref 80.0–100.0)
Monocytes Absolute: 0.4 K/uL (ref 0.1–1.0)
Monocytes Relative: 5 %
Neutro Abs: 3.7 K/uL (ref 1.7–7.7)
Neutrophils Relative %: 47 %
Platelet Count: 416 K/uL — ABNORMAL HIGH (ref 150–400)
RBC: 4.12 MIL/uL (ref 3.87–5.11)
RDW: 16.3 % — ABNORMAL HIGH (ref 11.5–15.5)
WBC Count: 7.6 K/uL (ref 4.0–10.5)
nRBC: 0 % (ref 0.0–0.2)

## 2024-03-15 LAB — CMP (CANCER CENTER ONLY)
ALT: 7 U/L (ref 0–44)
AST: 16 U/L (ref 15–41)
Albumin: 3.7 g/dL (ref 3.5–5.0)
Alkaline Phosphatase: 69 U/L (ref 38–126)
Anion gap: 9 (ref 5–15)
BUN: 9 mg/dL (ref 6–20)
CO2: 25 mmol/L (ref 22–32)
Calcium: 9.1 mg/dL (ref 8.9–10.3)
Chloride: 105 mmol/L (ref 98–111)
Creatinine: 0.78 mg/dL (ref 0.44–1.00)
GFR, Estimated: >60 mL/min (ref >60)
Glucose, Bld: 106 mg/dL — ABNORMAL HIGH (ref 70–99)
Potassium: 3.9 mmol/L (ref 3.5–5.1)
Sodium: 139 mmol/L (ref 135–145)
Total Bilirubin: 0.3 mg/dL (ref 0.0–1.2)
Total Protein: 8.9 g/dL — ABNORMAL HIGH (ref 6.5–8.1)

## 2024-03-15 LAB — FERRITIN: Ferritin: 47 ng/mL (ref 11–307)

## 2024-03-15 LAB — IRON AND IRON BINDING CAPACITY (CC-WL,HP ONLY)
Iron: 26 ug/dL — ABNORMAL LOW (ref 28–170)
Saturation Ratios: 8 % — ABNORMAL LOW (ref 10.4–31.8)
TIBC: 319 ug/dL (ref 250–450)
UIBC: 293 ug/dL

## 2024-03-15 NOTE — Progress Notes (Signed)
 HEMATOLOGY ONCOLOGY PROGRESS NOTE  Date of service: 03/15/2024  Patient Care Team: Campbell Reynolds, NP as PCP - General Waddell Danelle ORN, MD as PCP - Electrophysiology (Cardiology)  CHIEF COMPLAINT/PURPOSE OF CONSULTATION: Follow-up for continued evaluation and management of iron  deficiency anemia with reactive thrombocytosis   HISTORY OF PRESENTING ILLNESS: Amanda Davenport is a wonderful 43 y.o. female who has been referred to us  for evaluation and management of thrombocytosis.  She was last seen by us  in 2016 and was lost to follow-up due to issues with her medical insurance.   Patient has been referred back to us  for evaluation of her intermittent thrombocytosis and severe iron  deficiency anemia. The pt reports her hidradenitis suppurativa has been quite bothersome and she had surgery on 4/23-- left HS excision--still has an open surgical wound with some oozing .  Had blood loss during surgery as well.   Patient notes she has had significant issues with severe gout attacks- prednisone , allopurinol /colchicine linzie.  She is following with Dr.Khiem Caldwell MD for her rheumatology cares.   She continues to have heavy menstrual periods lasting 5 days of which 4 are very heavy. Has been taking Geritol  1 month and also has continued taking prenatal vitamins No personal history of VTE No FHx of VTE, bleeding or clotting disorders but mother had PE in the setting of surgery and active breast cancer. Quit smoking tobacco- 07/2019. Off medications for WPW.- previously on metoprolol  Had lost medical insurance - counseled on need to f/u with PCP   Most recent lab results (01/19/2020) of CBC is as follows: all values are WNL except for Hgb at 9.1, HCT at 29.9, MCV at 65.1, MCH at 19.8, MCHC at 30.5, RDW at 20.2, MPV at 6.6, PLT at 784K, CO2 at 20.   On review of systems, pt reports severe fatigue and some ice cravings.  No fevers chills night sweats or new bone pains.   SUMMARY OF ONCOLOGIC  HISTORY: Oncology History   No history exists.    INTERVAL HISTORY: Amanda Davenport is a 43 y.o. female who is here today for continued evaluation and management of iron  deficiency anemia with reactive thrombocytosis. She was last seen by me on 09/06/2023; at the time she did not have any concerns and was doing well.   Today, she notes having fatigue. She also mentions having lighter periods.  She is still receiving infusions for her HS but feels as if the progress has become stagnant. She has a flare up underneath her right armpit and left breast.   She has mentioned that she would like to not have a period entirely and has no desire to have children in the near future.  She now tries to limit the amount of ibuprofen  she takes during her period. She is not taking any B complex, B12 or iron  pills.  Denies any bloody or black stools, leg swelling, and abdominal pain.   REVIEW OF SYSTEMS:   10 Point review of systems of done and is negative except as noted above.  MEDICAL HISTORY Past Medical History:  Diagnosis Date   Anxiety    Asthma    06-02-2022  per pt has had asthma since having covid in 2021,  last used rescue inhaler last week   Axillary hidradenitis suppurativa    dermatologist--- dr r. [facility]   B12 deficiency    Depression    History of local infection of skin and subcutaneous tissue    recurrent infected boils   History of  sepsis 05/2021   admission in epic ;  secondary to cellulitis due to hydradenitis supprative right axilla   HSV-2 infection    IDA (iron  deficiency anemia)    hemtologist--- dr onesimo;  treated with infusions   Idiopathic chronic gout of multiple sites without tophus    followed by rheumonatologist-- dr lois havens   Inflammatory arthritis    rheumonatologist---dr r. szer;  treated remicade   Neuropathy    Other thrombocytosis    followed by dr onesimo;  intermittant   Pilonidal cyst    Wears glasses    WPW (Wolff-Parkinson-White  syndrome) 08/2011   (06-02-2022  pt denies symptoms & never had syncope / near syncope) cardiologist--- dr g. taylor; (first dx ED 05/ 2013)  per lov in epic 04-07-2022  concealed wpw patterened    SURGICAL HISTORY Past Surgical History:  Procedure Laterality Date   ADJACENT TISSUE TRANSFER/TISSUE REARRANGEMENT Left 07/28/2019   Procedure: ADJACENT TISSUE TRANSFER TO LEFT AXILLA GREATER THAN 100 CM SQUARED;  Surgeon: Arelia Filippo, MD;  Location: [facility];  Service: Plastics;  Laterality: Left;   COLONOSCOPY WITH PROPOFOL   01/04/2013   dr d. obie   HYDRADENITIS EXCISION Left 07/28/2019   Procedure: EXCISION LEFT HIDRADENITIS AXILLA;  Surgeon: Signe Mitzie LABOR, MD;  Location: [facility];  Service: General;  Laterality: Left;   INCISION AND DRAINAGE PERITONSILLAR ABSCESS Left 12/27/2004   @[facility]  by dr arlana   PILONIDAL CYST EXCISION N/A 06/05/2022   Procedure: TREPHINATION OF PILONIDAL CYST;  Surgeon: Signe Mitzie LABOR, MD;  Location: [facility] [place];  Service: General;  Laterality: N/A;    SOCIAL HISTORY Social History   Tobacco Use   Smoking status: Former    Current packs/day: 0.00    Types: Cigarettes    Start date: 2012    Quit date: 2013    Years since quitting: 12.9   Smokeless tobacco: Never  Vaping Use   Vaping status: Never Used  Substance Use Topics   Alcohol use: No   Drug use: Yes    Types: Marijuana    Comment: 06-02-2022  per pt smokes daily    Social History   Social History Narrative   Not on file    SOCIAL DRIVERS OF HEALTH SDOH Screenings   Food Insecurity: Low Risk (10/15/2023)   Received from [facility]  Housing: Low Risk (10/15/2023)   Received from [facility]  Transportation Needs: No Transportation Needs (10/15/2023)   Received from [facility]  Utilities: Low Risk (10/15/2023)   Received from [facility]  Alcohol Screen: Low Risk  (12/28/2020)  Depression (PHQ2-9): Medium Risk (12/28/2020)  Financial Resource Strain: Low  Risk  (12/28/2020)  Physical Activity: Inactive (12/28/2020)  Social Connections: Socially Isolated (12/28/2020)  Stress: Stress Concern Present (12/28/2020)  Tobacco Use: Medium Risk (10/15/2023)   Received from [facility]     FAMILY HISTORY Family History  Problem Relation Age of Onset   Breast cancer Mother    Pulmonary embolism Mother        died of PE   Colon cancer Mother    Irritable bowel syndrome Mother    Cancer Mother    Hypertension Father    Diabetes Paternal Grandmother    Heart disease Neg Hx    Stroke Neg Hx      ALLERGIES: is allergic to other, penicillins, shrimp [shellfish allergy], and sulfa  antibiotics.  MEDICATIONS  Current Outpatient Medications  Medication Sig Dispense Refill   albuterol  (VENTOLIN  HFA) 108 (90 Base) MCG/ACT  inhaler Inhale 1-2 puffs into the lungs every 6 (six) hours as needed for wheezing or shortness of breath.     cyclobenzaprine  (FLEXERIL ) 5 MG tablet Take 1 tablet (5 mg total) by mouth 3 (three) times daily as needed for muscle spasm (Patient taking differently: Take 5 mg by mouth 3 (three) times daily as needed for muscle spasms.) 45 tablet 0   ibuprofen  (ADVIL ) 800 MG tablet Take 800 mg by mouth 2 (two) times daily as needed for cramping.     inFLIXimab (REMICADE IV) Inject into the vein every 30 (thirty) days.     megestrol  (MEGACE ) 40 MG tablet 3 tablets daily for 5 days then 2 tablets for 5 days and then 1 tablet until finished 30 tablet 0   metoprolol  succinate (TOPROL -XL) 25 MG 24 hr tablet Take 50 mg by mouth daily.     ondansetron  (ZOFRAN -ODT) 4 MG disintegrating tablet Take 4 mg by mouth every 8 (eight) hours as needed for nausea.     oxyCODONE -acetaminophen  (PERCOCET) 10-325 MG tablet Take 1 tablet by mouth 3 (three) times daily as needed for pain. (Patient taking differently: Take 1 tablet by mouth every 8 (eight) hours as needed.) 90 tablet 0   pregabalin (LYRICA) 50 MG capsule Take 50 mg by mouth 3 (three) times daily as  needed.     traZODone  (DESYREL ) 50 MG tablet Take 50-100 mg by mouth at bedtime as needed for sleep.     No current facility-administered medications for this visit.    PHYSICAL EXAMINATION: ECOG PERFORMANCE STATUS: 1 - Symptomatic but completely ambulatory VITALS: There were no vitals filed for this visit. There were no vitals filed for this visit. There is no height or weight on file to calculate BMI.  GENERAL: alert, in no acute distress and comfortable SKIN: no acute rashes, no significant lesions EYES: conjunctiva are pink and non-injected, sclera anicteric OROPHARYNX: MMM, no exudates, no oropharyngeal erythema or ulceration NECK: supple, no JVD LYMPH:  no palpable lymphadenopathy in the cervical, axillary or inguinal regions LUNGS: clear to auscultation b/l with normal respiratory effort HEART: regular rate & rhythm ABDOMEN:  normoactive bowel sounds , non tender, not distended, no hepatosplenomegaly Extremity: no pedal edema PSYCH: alert & oriented x 3 with fluent speech NEURO: no focal motor/sensory deficits  LABORATORY DATA:   I have reviewed the data as listed     Latest Ref Rng & Units 09/06/2023    1:40 PM 03/08/2023    9:58 AM 12/21/2022    3:18 AM  CBC EXTENDED  WBC 4.0 - 10.5 K/uL 7.4  9.1  10.1   RBC 3.87 - 5.11 MIL/uL 4.42  4.68  3.77   Hemoglobin 12.0 - 15.0 g/dL 88.0  87.9  7.9   HCT 63.9 - 46.0 % 36.3  37.8  26.6   Platelets 150 - 400 K/uL 406  410  533   NEUT# 1.7 - 7.7 K/uL 3.4  5.2  4.4   Lymph# 0.7 - 4.0 K/uL 3.6  3.2  4.8         Latest Ref Rng & Units 09/06/2023    1:40 PM 03/08/2023    9:58 AM 12/21/2022    3:18 AM  CMP  Glucose 70 - 99 mg/dL 862  883  94   BUN 6 - 20 mg/dL 10  10  10    Creatinine 0.44 - 1.00 mg/dL 9.17  9.19  9.32   Sodium 135 - 145 mmol/L 138  137  137  Potassium 3.5 - 5.1 mmol/L 3.6  3.3  3.3   Chloride 98 - 111 mmol/L 107  107  105   CO2 22 - 32 mmol/L 24  23  24    Calcium  8.9 - 10.3 mg/dL 9.1  9.0  8.5   Total  Protein 6.5 - 8.1 g/dL 8.7  8.7    Total Bilirubin 0.0 - 1.2 mg/dL 0.3  0.3    Alkaline Phos 38 - 126 U/L 64  65    AST 15 - 41 U/L 17  12    ALT 0 - 44 U/L 11  8     Iron  and Iron  Binding Capacity Component     Latest Ref Rng 03/15/2024  Iron      28 - 170 ug/dL 26 (L)   TIBC     749 - 450 ug/dL 680   Saturation Ratios     10.4 - 31.8 % 8 (L)   UIBC     ug/dL 706     Legend: (L) Low  RADIOGRAPHIC STUDIES: I have personally reviewed the radiological images as listed and agreed with the findings in the report. No results found.  ASSESSMENT & PLAN:  43 y.o. female with  #1 Microcytic anemia - likely due to severe iron  deficiency.  Unclear etiology possibly chronic hemorrhoidal bleeding versus heavy periods versus other GI losses (?NSAIDS associated ulceration) versus blood loss from ulcerations related to her hidradenitis suppurativa. Additional Elements causing anemia include chronic inflammatory state due to hydradenitis suppurativa and severe gout. Given the relatively high RBC numbers for the given hemoglobin cannot rule out underlying thalassemia/hemoglobinopathy.   #2 Thrombocytosis This is likely reactive from severe iron  deficiency anemia, surgery, previous smoking, inflammation from her hidradenitis suppurativa and gout.   #3 B12 deficiency likely due to poor absorption #4 severe iron  deficiency  PLAN: - Discussed lab results on 03/15/2024 in detail with patient: - CBC is stable - CMP is stable - Ferritin is pending   FOLLOW-UP as a phone visit once Ferritin labs are available.  The total time spent in the appointment was *** minutes* .  All of the patient's questions were answered and the patient knows to call the clinic with any problems, questions, or concerns.  Emaline Saran MD MS AAHIVMS [facility] Hematology/Oncology Physician [facility]  *Total Encounter Time as defined by the Centers for Medicare and Medicaid Services includes, in  addition to the face-to-face time of a patient visit (documented in the note above) non-face-to-face time: obtaining and reviewing outside history, ordering and reviewing medications, tests or procedures, care coordination (communications with other health care professionals or caregivers) and documentation in the medical record.  I, Marijo Sharps, acting as a neurosurgeon for Emaline Saran, MD.,have documented all relevant documentation on the behalf of Emaline Saran, MD,as directed by  Emaline Saran, MD while in the presence of Emaline Saran, MD.  I have reviewed the above documentation for accuracy and completeness, and I agree with the above.  Mardene Lessig, MD

## 2024-03-19 ENCOUNTER — Encounter: Payer: Self-pay | Admitting: Hematology

## 2024-03-20 ENCOUNTER — Telehealth: Payer: Self-pay

## 2024-03-20 ENCOUNTER — Other Ambulatory Visit (HOSPITAL_COMMUNITY): Payer: Self-pay | Admitting: Hematology

## 2024-03-20 NOTE — Addendum Note (Signed)
 Addended by: DAYNE SHERRY RAMAN on: 03/20/2024 02:54 PM   Modules accepted: Orders

## 2024-03-20 NOTE — Telephone Encounter (Signed)
 Dr. Onesimo and [facility], patient will be scheduled as soon as possible.  Auth Submission: NO AUTH NEEDED Site of care: Site of care: [facility] Payer: [employer] medicare Medication & CPT/J Code(s) submitted: Venofer  (Iron  Sucrose) J1756 Diagnosis Code:  Route of submission (phone, fax, portal):  Phone # Fax # Auth type: Buy/Bill PB Units/visits requested: 300mg  x 3 doses Reference number:  Approval from: 03/20/24 to 04/05/24

## 2024-03-27 ENCOUNTER — Ambulatory Visit

## 2024-03-27 VITALS — BP 116/83 | HR 88 | Temp 98.6°F | Resp 18 | Ht 69.0 in | Wt 302.6 lb

## 2024-03-27 DIAGNOSIS — D508 Other iron deficiency anemias: Secondary | ICD-10-CM

## 2024-03-27 MED ORDER — SODIUM CHLORIDE 0.9 % IV SOLN
300.0000 mg | Freq: Once | INTRAVENOUS | Status: AC
  Administered 2024-03-27: 300 mg via INTRAVENOUS
  Filled 2024-03-27: qty 15

## 2024-03-27 MED ORDER — IRON SUCROSE 300 MG IVPB - SIMPLE MED: 300.0000 mg | Freq: Once | Status: DC

## 2024-03-27 NOTE — Progress Notes (Signed)
 Diagnosis:  Iron  Deficiency Anemia  Provider:  Mannam, Praveen MD  Procedure: IV Infusion  IV Type: Peripheral, IV Location: R Hand   Venofer  (Iron  Sucrose), Dose: 300 mg  Infusion Start Time: 1311 pm  Infusion Stop Time: 1450 pm  Post Infusion IV Care: Patient declined observation and Peripheral IV Discontinued  Discharge: Condition: Good, Destination: Home . AVS Declined  Performed by:  Maximiano JONELLE Pouch, LPN

## 2024-04-04 ENCOUNTER — Ambulatory Visit (INDEPENDENT_AMBULATORY_CARE_PROVIDER_SITE_OTHER)

## 2024-04-04 VITALS — BP 102/50 | HR 91 | Temp 98.3°F | Resp 16 | Ht 70.0 in | Wt 304.0 lb

## 2024-04-04 DIAGNOSIS — D508 Other iron deficiency anemias: Secondary | ICD-10-CM

## 2024-04-04 MED ORDER — IRON SUCROSE 300 MG IVPB - SIMPLE MED
300.0000 mg | Freq: Once | Status: AC
  Administered 2024-04-04: 300 mg via INTRAVENOUS

## 2024-04-04 NOTE — Progress Notes (Signed)
 Diagnosis: Iron  Deficiency Anemia  Provider:  Praveen Mannam MD  Procedure: IV Infusion  IV Type: Peripheral, IV Location: R Hand   Venofer  (Iron  Sucrose), Dose: 300 mg  Infusion Start Time: 1240  Infusion Stop Time: 1415  Post Infusion IV Care: Patient declined observation and Peripheral IV Discontinued  Discharge: Condition: Good, Destination: Home . AVS Declined  Performed by:  Leita FORBES Miles, LPN

## 2024-04-11 ENCOUNTER — Ambulatory Visit (INDEPENDENT_AMBULATORY_CARE_PROVIDER_SITE_OTHER)

## 2024-04-11 VITALS — BP 110/72 | HR 107 | Temp 98.1°F | Resp 24 | Ht 70.0 in | Wt 298.8 lb

## 2024-04-11 DIAGNOSIS — D508 Other iron deficiency anemias: Secondary | ICD-10-CM

## 2024-04-11 MED ORDER — IRON SUCROSE 300 MG IVPB - SIMPLE MED
300.0000 mg | Freq: Once | Status: AC
  Administered 2024-04-11: 300 mg via INTRAVENOUS
  Filled 2024-04-11: qty 265

## 2024-04-11 NOTE — Progress Notes (Signed)
 Diagnosis: Iron  Deficiency Anemia  Provider:  Praveen Mannam MD  Procedure: IV Infusion  IV Type: Peripheral, IV Location: R Hand  Venofer  (Iron  Sucrose), Dose: 300 mg  Infusion Start Time: 1229  Infusion Stop Time: 1419  Post Infusion IV Care: Patient declined observation and Peripheral IV Discontinued  Discharge: Condition: Good, Destination: Home . AVS Declined  Performed by:  Echo Allsbrook, RN

## 2024-09-19 ENCOUNTER — Inpatient Hospital Stay

## 2024-09-19 ENCOUNTER — Inpatient Hospital Stay: Admitting: Hematology
